# Patient Record
Sex: Female | Born: 1950 | Race: White | Hispanic: No | Smoking: Never smoker
Health system: Southern US, Community
[De-identification: ages and names within clinical notes are randomized; demographics above are authoritative.]

## PROBLEM LIST (undated history)

## (undated) DIAGNOSIS — H3320 Serous retinal detachment, unspecified eye: Secondary | ICD-10-CM

## (undated) DIAGNOSIS — J069 Acute upper respiratory infection, unspecified: Secondary | ICD-10-CM

## (undated) DIAGNOSIS — K219 Gastro-esophageal reflux disease without esophagitis: Secondary | ICD-10-CM

## (undated) DIAGNOSIS — Z973 Presence of spectacles and contact lenses: Secondary | ICD-10-CM

## (undated) DIAGNOSIS — J189 Pneumonia, unspecified organism: Secondary | ICD-10-CM

## (undated) DIAGNOSIS — R030 Elevated blood-pressure reading, without diagnosis of hypertension: Secondary | ICD-10-CM

## (undated) DIAGNOSIS — Z8489 Family history of other specified conditions: Secondary | ICD-10-CM

## (undated) DIAGNOSIS — C210 Malignant neoplasm of anus, unspecified: Secondary | ICD-10-CM

## (undated) DIAGNOSIS — G709 Myoneural disorder, unspecified: Secondary | ICD-10-CM

## (undated) DIAGNOSIS — C7951 Secondary malignant neoplasm of bone: Secondary | ICD-10-CM

## (undated) DIAGNOSIS — F32A Depression, unspecified: Secondary | ICD-10-CM

## (undated) DIAGNOSIS — N390 Urinary tract infection, site not specified: Secondary | ICD-10-CM

## (undated) DIAGNOSIS — C20 Malignant neoplasm of rectum: Secondary | ICD-10-CM

## (undated) DIAGNOSIS — I10 Essential (primary) hypertension: Secondary | ICD-10-CM

## (undated) DIAGNOSIS — R599 Enlarged lymph nodes, unspecified: Secondary | ICD-10-CM

## (undated) DIAGNOSIS — F329 Major depressive disorder, single episode, unspecified: Secondary | ICD-10-CM

## (undated) DIAGNOSIS — N3090 Cystitis, unspecified without hematuria: Secondary | ICD-10-CM

## (undated) DIAGNOSIS — I972 Postmastectomy lymphedema syndrome: Secondary | ICD-10-CM

## (undated) DIAGNOSIS — C50919 Malignant neoplasm of unspecified site of unspecified female breast: Secondary | ICD-10-CM

## (undated) DIAGNOSIS — Z95828 Presence of other vascular implants and grafts: Secondary | ICD-10-CM

## (undated) DIAGNOSIS — S42309A Unspecified fracture of shaft of humerus, unspecified arm, initial encounter for closed fracture: Secondary | ICD-10-CM

## (undated) DIAGNOSIS — IMO0001 Reserved for inherently not codable concepts without codable children: Secondary | ICD-10-CM

## (undated) DIAGNOSIS — R5383 Other fatigue: Secondary | ICD-10-CM

## (undated) HISTORY — DX: Cystitis, unspecified without hematuria: N30.90

## (undated) HISTORY — DX: Malignant neoplasm of anus, unspecified: C21.0

## (undated) HISTORY — DX: Malignant neoplasm of unspecified site of unspecified female breast: C50.919

## (undated) HISTORY — DX: Reserved for inherently not codable concepts without codable children: IMO0001

## (undated) HISTORY — DX: Presence of spectacles and contact lenses: Z97.3

## (undated) HISTORY — DX: Urinary tract infection, site not specified: N39.0

## (undated) HISTORY — DX: Gastro-esophageal reflux disease without esophagitis: K21.9

## (undated) HISTORY — PX: OTHER SURGICAL HISTORY: SHX169

## (undated) HISTORY — DX: Acute upper respiratory infection, unspecified: J06.9

## (undated) HISTORY — DX: Malignant neoplasm of rectum: C20

## (undated) HISTORY — DX: Enlarged lymph nodes, unspecified: R59.9

## (undated) HISTORY — PX: MASTECTOMY MODIFIED RADICAL: SUR848

## (undated) HISTORY — DX: Unspecified fracture of shaft of humerus, unspecified arm, initial encounter for closed fracture: S42.309A

## (undated) HISTORY — DX: Pneumonia, unspecified organism: J18.9

## (undated) HISTORY — DX: Secondary malignant neoplasm of bone: C79.51

## (undated) HISTORY — DX: Presence of other vascular implants and grafts: Z95.828

## (undated) HISTORY — DX: Other fatigue: R53.83

## (undated) HISTORY — DX: Elevated blood-pressure reading, without diagnosis of hypertension: R03.0

## (undated) HISTORY — DX: Serous retinal detachment, unspecified eye: H33.20

## (undated) HISTORY — PX: CATARACT EXTRACTION: SUR2

## (undated) NOTE — *Deleted (*Deleted)
   06/22/20 0746  Nephrostomy Left 10.2 Fr.  Placement Date: 06/17/20   Person Inserting Catheter: Fredia Sorrow  Location: Left  Tube Size (Fr.): 10.2 Fr.  Urine Returned: Yes  Dressing Status Clean;Dry;Intact  Dressing Type Split gauze  Dressing Change Due 06/23/20  Tube Status To gravity  Collection Container Urostomy Pouch  Securement Method Tape  Output (mL) 250 mL  Dressing changed per order.  Site is slightly pink.  No drainage or complaints of pain while cleaning the site.  Stat loc in place.  Covered with split gauze and secured with paper tape for pt comfort.  Pt tolerated well.

---

## 1997-10-04 ENCOUNTER — Ambulatory Visit (HOSPITAL_COMMUNITY): Admission: RE | Admit: 1997-10-04 | Discharge: 1997-10-04 | Payer: Self-pay | Admitting: Obstetrics & Gynecology

## 2001-08-26 ENCOUNTER — Encounter: Payer: Self-pay | Admitting: Neurosurgery

## 2001-08-26 ENCOUNTER — Ambulatory Visit (HOSPITAL_COMMUNITY): Admission: RE | Admit: 2001-08-26 | Discharge: 2001-08-26 | Payer: Self-pay | Admitting: Neurosurgery

## 2001-08-30 ENCOUNTER — Ambulatory Visit (HOSPITAL_COMMUNITY): Admission: RE | Admit: 2001-08-30 | Discharge: 2001-08-30 | Payer: Self-pay | Admitting: Neurosurgery

## 2001-08-30 ENCOUNTER — Encounter: Payer: Self-pay | Admitting: Neurosurgery

## 2002-01-17 ENCOUNTER — Other Ambulatory Visit: Admission: RE | Admit: 2002-01-17 | Discharge: 2002-01-17 | Payer: Self-pay | Admitting: Obstetrics & Gynecology

## 2003-01-23 ENCOUNTER — Other Ambulatory Visit: Admission: RE | Admit: 2003-01-23 | Discharge: 2003-01-23 | Payer: Self-pay | Admitting: Obstetrics & Gynecology

## 2004-04-10 ENCOUNTER — Ambulatory Visit (HOSPITAL_COMMUNITY): Admission: RE | Admit: 2004-04-10 | Discharge: 2004-04-11 | Payer: Self-pay | Admitting: Ophthalmology

## 2004-05-07 ENCOUNTER — Other Ambulatory Visit: Admission: RE | Admit: 2004-05-07 | Discharge: 2004-05-07 | Payer: Self-pay | Admitting: Obstetrics & Gynecology

## 2005-07-14 ENCOUNTER — Other Ambulatory Visit: Admission: RE | Admit: 2005-07-14 | Discharge: 2005-07-14 | Payer: Self-pay | Admitting: Obstetrics & Gynecology

## 2007-12-27 ENCOUNTER — Encounter: Admission: RE | Admit: 2007-12-27 | Discharge: 2007-12-27 | Payer: Self-pay | Admitting: Obstetrics & Gynecology

## 2008-04-09 ENCOUNTER — Encounter: Admission: RE | Admit: 2008-04-09 | Discharge: 2008-04-09 | Payer: Self-pay | Admitting: Internal Medicine

## 2008-04-09 ENCOUNTER — Encounter (INDEPENDENT_AMBULATORY_CARE_PROVIDER_SITE_OTHER): Payer: Self-pay | Admitting: Diagnostic Radiology

## 2008-04-12 ENCOUNTER — Encounter: Admission: RE | Admit: 2008-04-12 | Discharge: 2008-04-12 | Payer: Self-pay | Admitting: Internal Medicine

## 2008-04-15 ENCOUNTER — Encounter: Admission: RE | Admit: 2008-04-15 | Discharge: 2008-04-15 | Payer: Self-pay | Admitting: Internal Medicine

## 2008-04-18 ENCOUNTER — Ambulatory Visit (HOSPITAL_COMMUNITY): Admission: RE | Admit: 2008-04-18 | Discharge: 2008-04-18 | Payer: Self-pay | Admitting: Internal Medicine

## 2008-04-19 ENCOUNTER — Encounter (HOSPITAL_COMMUNITY): Admission: RE | Admit: 2008-04-19 | Discharge: 2008-05-07 | Payer: Self-pay | Admitting: Internal Medicine

## 2008-04-20 ENCOUNTER — Encounter (INDEPENDENT_AMBULATORY_CARE_PROVIDER_SITE_OTHER): Payer: Self-pay | Admitting: Diagnostic Radiology

## 2008-04-20 ENCOUNTER — Encounter: Admission: RE | Admit: 2008-04-20 | Discharge: 2008-04-20 | Payer: Self-pay | Admitting: Internal Medicine

## 2008-04-24 ENCOUNTER — Encounter (HOSPITAL_COMMUNITY): Admission: RE | Admit: 2008-04-24 | Discharge: 2008-05-07 | Payer: Self-pay | Admitting: Oncology

## 2008-04-24 ENCOUNTER — Ambulatory Visit (HOSPITAL_COMMUNITY): Payer: Self-pay | Admitting: Oncology

## 2008-04-26 ENCOUNTER — Encounter (HOSPITAL_COMMUNITY): Payer: Self-pay | Admitting: Oncology

## 2008-04-30 ENCOUNTER — Ambulatory Visit (HOSPITAL_BASED_OUTPATIENT_CLINIC_OR_DEPARTMENT_OTHER): Admission: RE | Admit: 2008-04-30 | Discharge: 2008-04-30 | Payer: Self-pay | Admitting: General Surgery

## 2008-04-30 ENCOUNTER — Ambulatory Visit: Admission: RE | Admit: 2008-04-30 | Discharge: 2008-06-04 | Payer: Self-pay | Admitting: Radiation Oncology

## 2008-05-14 ENCOUNTER — Encounter (HOSPITAL_COMMUNITY): Admission: RE | Admit: 2008-05-14 | Discharge: 2008-06-13 | Payer: Self-pay | Admitting: Oncology

## 2008-06-14 ENCOUNTER — Ambulatory Visit (HOSPITAL_COMMUNITY): Payer: Self-pay | Admitting: Oncology

## 2008-06-20 ENCOUNTER — Encounter (HOSPITAL_COMMUNITY): Admission: RE | Admit: 2008-06-20 | Discharge: 2008-07-20 | Payer: Self-pay | Admitting: Oncology

## 2008-06-20 ENCOUNTER — Ambulatory Visit: Payer: Self-pay | Admitting: Cardiology

## 2008-06-20 ENCOUNTER — Encounter (HOSPITAL_COMMUNITY): Payer: Self-pay | Admitting: Oncology

## 2008-07-03 ENCOUNTER — Encounter: Admission: RE | Admit: 2008-07-03 | Discharge: 2008-07-03 | Payer: Self-pay | Admitting: Oncology

## 2008-07-25 ENCOUNTER — Encounter (HOSPITAL_COMMUNITY): Admission: RE | Admit: 2008-07-25 | Discharge: 2008-08-24 | Payer: Self-pay | Admitting: Oncology

## 2008-07-30 ENCOUNTER — Ambulatory Visit (HOSPITAL_COMMUNITY): Payer: Self-pay | Admitting: Oncology

## 2008-08-24 ENCOUNTER — Ambulatory Visit (HOSPITAL_COMMUNITY): Admission: RE | Admit: 2008-08-24 | Discharge: 2008-08-24 | Payer: Self-pay | Admitting: Oncology

## 2008-08-27 ENCOUNTER — Encounter (HOSPITAL_COMMUNITY): Admission: RE | Admit: 2008-08-27 | Discharge: 2008-09-26 | Payer: Self-pay | Admitting: Oncology

## 2008-08-30 ENCOUNTER — Encounter (INDEPENDENT_AMBULATORY_CARE_PROVIDER_SITE_OTHER): Payer: Self-pay | Admitting: General Surgery

## 2008-08-30 ENCOUNTER — Ambulatory Visit (HOSPITAL_COMMUNITY): Admission: RE | Admit: 2008-08-30 | Discharge: 2008-08-31 | Payer: Self-pay | Admitting: General Surgery

## 2008-09-12 ENCOUNTER — Ambulatory Visit: Admission: RE | Admit: 2008-09-12 | Discharge: 2008-12-11 | Payer: Self-pay | Admitting: Radiation Oncology

## 2008-09-17 ENCOUNTER — Encounter (HOSPITAL_COMMUNITY): Payer: Self-pay | Admitting: Oncology

## 2008-09-17 ENCOUNTER — Ambulatory Visit: Payer: Self-pay | Admitting: Cardiology

## 2008-09-24 ENCOUNTER — Ambulatory Visit (HOSPITAL_COMMUNITY): Payer: Self-pay | Admitting: Oncology

## 2008-10-15 ENCOUNTER — Encounter (HOSPITAL_COMMUNITY): Admission: RE | Admit: 2008-10-15 | Discharge: 2008-11-14 | Payer: Self-pay | Admitting: Oncology

## 2008-11-27 ENCOUNTER — Encounter (HOSPITAL_COMMUNITY): Admission: RE | Admit: 2008-11-27 | Discharge: 2008-12-27 | Payer: Self-pay | Admitting: Oncology

## 2008-11-27 ENCOUNTER — Ambulatory Visit (HOSPITAL_COMMUNITY): Payer: Self-pay | Admitting: Oncology

## 2008-12-12 ENCOUNTER — Ambulatory Visit: Payer: Self-pay | Admitting: Cardiology

## 2008-12-12 ENCOUNTER — Encounter (HOSPITAL_COMMUNITY): Payer: Self-pay | Admitting: Oncology

## 2009-01-08 ENCOUNTER — Encounter (HOSPITAL_COMMUNITY): Admission: RE | Admit: 2009-01-08 | Discharge: 2009-02-07 | Payer: Self-pay | Admitting: Oncology

## 2009-01-29 ENCOUNTER — Ambulatory Visit (HOSPITAL_COMMUNITY): Payer: Self-pay | Admitting: Oncology

## 2009-02-19 ENCOUNTER — Encounter (HOSPITAL_COMMUNITY): Admission: RE | Admit: 2009-02-19 | Discharge: 2009-03-21 | Payer: Self-pay | Admitting: Oncology

## 2009-03-14 ENCOUNTER — Encounter (HOSPITAL_COMMUNITY): Payer: Self-pay | Admitting: Oncology

## 2009-03-14 ENCOUNTER — Ambulatory Visit: Payer: Self-pay | Admitting: Cardiology

## 2009-03-29 ENCOUNTER — Ambulatory Visit (HOSPITAL_COMMUNITY): Payer: Self-pay | Admitting: Oncology

## 2009-03-29 ENCOUNTER — Encounter (HOSPITAL_COMMUNITY): Admission: RE | Admit: 2009-03-29 | Discharge: 2009-04-28 | Payer: Self-pay | Admitting: Oncology

## 2009-04-04 ENCOUNTER — Encounter (HOSPITAL_COMMUNITY): Admission: RE | Admit: 2009-04-04 | Discharge: 2009-05-04 | Payer: Self-pay | Admitting: Oncology

## 2009-05-14 ENCOUNTER — Ambulatory Visit (HOSPITAL_COMMUNITY): Payer: Self-pay | Admitting: Oncology

## 2009-05-14 ENCOUNTER — Encounter (HOSPITAL_COMMUNITY): Admission: RE | Admit: 2009-05-14 | Discharge: 2009-06-13 | Payer: Self-pay | Admitting: Oncology

## 2009-05-27 ENCOUNTER — Encounter: Admission: RE | Admit: 2009-05-27 | Discharge: 2009-05-27 | Payer: Self-pay | Admitting: Oncology

## 2009-06-05 ENCOUNTER — Encounter (HOSPITAL_COMMUNITY): Payer: Self-pay | Admitting: Oncology

## 2009-06-05 ENCOUNTER — Ambulatory Visit: Payer: Self-pay | Admitting: Cardiology

## 2009-08-06 ENCOUNTER — Ambulatory Visit (HOSPITAL_COMMUNITY): Payer: Self-pay | Admitting: Oncology

## 2009-08-14 ENCOUNTER — Encounter: Admission: RE | Admit: 2009-08-14 | Discharge: 2009-08-14 | Payer: Self-pay | Admitting: Obstetrics & Gynecology

## 2009-08-20 ENCOUNTER — Encounter (HOSPITAL_COMMUNITY): Admission: RE | Admit: 2009-08-20 | Discharge: 2009-09-19 | Payer: Self-pay | Admitting: Internal Medicine

## 2009-08-20 ENCOUNTER — Ambulatory Visit (HOSPITAL_COMMUNITY): Payer: Self-pay | Admitting: Internal Medicine

## 2009-09-20 ENCOUNTER — Encounter (HOSPITAL_COMMUNITY): Admission: RE | Admit: 2009-09-20 | Discharge: 2009-10-20 | Payer: Self-pay | Admitting: Oncology

## 2009-10-31 ENCOUNTER — Ambulatory Visit (HOSPITAL_COMMUNITY): Payer: Self-pay | Admitting: Oncology

## 2009-12-24 ENCOUNTER — Ambulatory Visit (HOSPITAL_COMMUNITY): Payer: Self-pay | Admitting: Oncology

## 2010-01-28 ENCOUNTER — Encounter (HOSPITAL_COMMUNITY): Admission: RE | Admit: 2010-01-28 | Discharge: 2010-02-27 | Payer: Self-pay | Admitting: Oncology

## 2010-02-18 ENCOUNTER — Ambulatory Visit (HOSPITAL_COMMUNITY): Payer: Self-pay | Admitting: Oncology

## 2010-05-01 ENCOUNTER — Ambulatory Visit (HOSPITAL_COMMUNITY): Payer: Self-pay | Admitting: Oncology

## 2010-06-13 ENCOUNTER — Encounter (HOSPITAL_COMMUNITY)
Admission: RE | Admit: 2010-06-13 | Discharge: 2010-07-13 | Payer: Self-pay | Source: Home / Self Care | Admitting: Oncology

## 2010-06-24 ENCOUNTER — Ambulatory Visit (HOSPITAL_COMMUNITY): Payer: Self-pay | Admitting: Oncology

## 2010-07-25 ENCOUNTER — Encounter (HOSPITAL_COMMUNITY)
Admission: RE | Admit: 2010-07-25 | Discharge: 2010-08-24 | Payer: Self-pay | Source: Home / Self Care | Attending: Oncology | Admitting: Oncology

## 2010-08-15 ENCOUNTER — Encounter
Admission: RE | Admit: 2010-08-15 | Discharge: 2010-08-15 | Payer: Self-pay | Source: Home / Self Care | Attending: Oncology | Admitting: Oncology

## 2010-08-19 ENCOUNTER — Encounter
Admission: RE | Admit: 2010-08-19 | Discharge: 2010-08-19 | Payer: Self-pay | Source: Home / Self Care | Attending: Oncology | Admitting: Oncology

## 2010-09-05 ENCOUNTER — Ambulatory Visit (HOSPITAL_COMMUNITY)
Admission: RE | Admit: 2010-09-05 | Discharge: 2010-09-09 | Payer: Self-pay | Source: Home / Self Care | Attending: Oncology | Admitting: Oncology

## 2010-09-05 ENCOUNTER — Encounter (HOSPITAL_COMMUNITY)
Admission: RE | Admit: 2010-09-05 | Discharge: 2010-09-09 | Payer: Self-pay | Source: Home / Self Care | Attending: Oncology | Admitting: Oncology

## 2010-10-17 ENCOUNTER — Other Ambulatory Visit (HOSPITAL_COMMUNITY): Payer: BC Managed Care – PPO

## 2010-10-17 ENCOUNTER — Encounter (HOSPITAL_COMMUNITY): Payer: BC Managed Care – PPO | Attending: Oncology

## 2010-10-17 DIAGNOSIS — Z79899 Other long term (current) drug therapy: Secondary | ICD-10-CM | POA: Insufficient documentation

## 2010-10-17 DIAGNOSIS — C50919 Malignant neoplasm of unspecified site of unspecified female breast: Secondary | ICD-10-CM

## 2010-10-21 LAB — CBC
HCT: 38 % (ref 36.0–46.0)
Hemoglobin: 12.9 g/dL (ref 12.0–15.0)
MCH: 33.4 pg (ref 26.0–34.0)
MCHC: 33.9 g/dL (ref 30.0–36.0)
MCV: 98.6 fL (ref 78.0–100.0)
Platelets: 201 10*3/uL (ref 150–400)
RBC: 3.85 MIL/uL — ABNORMAL LOW (ref 3.87–5.11)
RDW: 12.8 % (ref 11.5–15.5)
WBC: 6.8 10*3/uL (ref 4.0–10.5)

## 2010-10-21 LAB — COMPREHENSIVE METABOLIC PANEL
ALT: 18 U/L (ref 0–35)
AST: 25 U/L (ref 0–37)
Albumin: 4.2 g/dL (ref 3.5–5.2)
Alkaline Phosphatase: 61 U/L (ref 39–117)
BUN: 14 mg/dL (ref 6–23)
CO2: 25 mEq/L (ref 19–32)
Calcium: 9.4 mg/dL (ref 8.4–10.5)
Chloride: 104 mEq/L (ref 96–112)
Creatinine, Ser: 0.76 mg/dL (ref 0.4–1.2)
GFR calc Af Amer: >60 mL/min (ref >60)
GFR calc non Af Amer: >60 mL/min (ref >60)
Glucose, Bld: 87 mg/dL (ref 70–99)
Potassium: 3.9 mEq/L (ref 3.5–5.1)
Sodium: 136 mEq/L (ref 135–145)
Total Bilirubin: 0.7 mg/dL (ref 0.3–1.2)
Total Protein: 6.9 g/dL (ref 6.0–8.3)

## 2010-10-21 LAB — DIFFERENTIAL
Basophils Absolute: 0 10*3/uL (ref 0.0–0.1)
Basophils Relative: 1 % (ref 0–1)
Eosinophils Absolute: 0 10*3/uL (ref 0.0–0.7)
Eosinophils Relative: 1 % (ref 0–5)
Lymphocytes Relative: 26 % (ref 12–46)
Lymphs Abs: 1.8 10*3/uL (ref 0.7–4.0)
Monocytes Absolute: 0.4 10*3/uL (ref 0.1–1.0)
Monocytes Relative: 6 % (ref 3–12)
Neutro Abs: 4.5 10*3/uL (ref 1.7–7.7)
Neutrophils Relative %: 67 % (ref 43–77)

## 2010-10-21 LAB — CANCER ANTIGEN 27.29: CA 27.29: 20 U/mL (ref 0–39)

## 2010-10-26 LAB — COMPREHENSIVE METABOLIC PANEL
ALT: 19 U/L (ref 0–35)
AST: 27 U/L (ref 0–37)
Albumin: 3.9 g/dL (ref 3.5–5.2)
Alkaline Phosphatase: 61 U/L (ref 39–117)
BUN: 13 mg/dL (ref 6–23)
CO2: 27 mEq/L (ref 19–32)
Calcium: 9.2 mg/dL (ref 8.4–10.5)
Chloride: 103 mEq/L (ref 96–112)
Creatinine, Ser: 0.74 mg/dL (ref 0.4–1.2)
GFR calc Af Amer: >60 mL/min (ref >60)
GFR calc non Af Amer: >60 mL/min (ref >60)
Glucose, Bld: 89 mg/dL (ref 70–99)
Potassium: 4 mEq/L (ref 3.5–5.1)
Sodium: 136 mEq/L (ref 135–145)
Total Bilirubin: 0.6 mg/dL (ref 0.3–1.2)
Total Protein: 6.7 g/dL (ref 6.0–8.3)

## 2010-10-26 LAB — CREATININE, SERUM
Creatinine, Ser: 0.83 mg/dL (ref 0.4–1.2)
GFR calc Af Amer: >60 mL/min (ref >60)
GFR calc non Af Amer: >60 mL/min (ref >60)

## 2010-10-28 ENCOUNTER — Ambulatory Visit (HOSPITAL_COMMUNITY): Payer: BC Managed Care – PPO | Admitting: Oncology

## 2010-10-28 DIAGNOSIS — C50919 Malignant neoplasm of unspecified site of unspecified female breast: Secondary | ICD-10-CM

## 2010-10-29 LAB — CBC
HCT: 39.8 % (ref 36.0–46.0)
Hemoglobin: 13.6 g/dL (ref 12.0–15.0)
MCHC: 34.3 g/dL (ref 30.0–36.0)
MCV: 98.7 fL (ref 78.0–100.0)
Platelets: 204 10*3/uL (ref 150–400)
RBC: 4.03 MIL/uL (ref 3.87–5.11)
RDW: 13.5 % (ref 11.5–15.5)
WBC: 6.1 10*3/uL (ref 4.0–10.5)

## 2010-10-29 LAB — DIFFERENTIAL
Basophils Absolute: 0 10*3/uL (ref 0.0–0.1)
Basophils Relative: 1 % (ref 0–1)
Eosinophils Absolute: 0 10*3/uL (ref 0.0–0.7)
Eosinophils Relative: 0 % (ref 0–5)
Lymphocytes Relative: 21 % (ref 12–46)
Lymphs Abs: 1.3 10*3/uL (ref 0.7–4.0)
Monocytes Absolute: 0.4 10*3/uL (ref 0.1–1.0)
Monocytes Relative: 7 % (ref 3–12)
Neutro Abs: 4.3 10*3/uL (ref 1.7–7.7)
Neutrophils Relative %: 71 % (ref 43–77)

## 2010-10-29 LAB — COMPREHENSIVE METABOLIC PANEL
ALT: 20 U/L (ref 0–35)
AST: 26 U/L (ref 0–37)
Albumin: 3.9 g/dL (ref 3.5–5.2)
Alkaline Phosphatase: 67 U/L (ref 39–117)
BUN: 16 mg/dL (ref 6–23)
CO2: 27 mEq/L (ref 19–32)
Calcium: 9.4 mg/dL (ref 8.4–10.5)
Chloride: 103 mEq/L (ref 96–112)
Creatinine, Ser: 0.68 mg/dL (ref 0.4–1.2)
GFR calc Af Amer: >60 mL/min (ref >60)
GFR calc non Af Amer: >60 mL/min (ref >60)
Glucose, Bld: 118 mg/dL — ABNORMAL HIGH (ref 70–99)
Potassium: 4 mEq/L (ref 3.5–5.1)
Sodium: 137 mEq/L (ref 135–145)
Total Bilirubin: 0.7 mg/dL (ref 0.3–1.2)
Total Protein: 6.8 g/dL (ref 6.0–8.3)

## 2010-11-13 LAB — DIFFERENTIAL
Basophils Absolute: 0 10*3/uL (ref 0.0–0.1)
Basophils Relative: 0 % (ref 0–1)
Eosinophils Absolute: 0.1 10*3/uL (ref 0.0–0.7)
Eosinophils Relative: 1 % (ref 0–5)
Lymphocytes Relative: 20 % (ref 12–46)
Lymphs Abs: 1.1 10*3/uL (ref 0.7–4.0)
Monocytes Absolute: 0.3 10*3/uL (ref 0.1–1.0)
Monocytes Relative: 6 % (ref 3–12)
Neutro Abs: 3.9 10*3/uL (ref 1.7–7.7)
Neutrophils Relative %: 72 % (ref 43–77)

## 2010-11-13 LAB — CBC
HCT: 37.9 % (ref 36.0–46.0)
Hemoglobin: 13.2 g/dL (ref 12.0–15.0)
MCHC: 34.9 g/dL (ref 30.0–36.0)
MCV: 97 fL (ref 78.0–100.0)
Platelets: 200 10*3/uL (ref 150–400)
RBC: 3.91 MIL/uL (ref 3.87–5.11)
RDW: 13.3 % (ref 11.5–15.5)
WBC: 5.5 10*3/uL (ref 4.0–10.5)

## 2010-11-15 LAB — COMPREHENSIVE METABOLIC PANEL
ALT: 15 U/L (ref 0–35)
AST: 27 U/L (ref 0–37)
Albumin: 3.7 g/dL (ref 3.5–5.2)
Alkaline Phosphatase: 65 U/L (ref 39–117)
BUN: 15 mg/dL (ref 6–23)
CO2: 27 mEq/L (ref 19–32)
Calcium: 9.3 mg/dL (ref 8.4–10.5)
Chloride: 102 mEq/L (ref 96–112)
Creatinine, Ser: 0.88 mg/dL (ref 0.4–1.2)
GFR calc Af Amer: >60 mL/min (ref >60)
GFR calc non Af Amer: >60 mL/min (ref >60)
Glucose, Bld: 123 mg/dL — ABNORMAL HIGH (ref 70–99)
Potassium: 3.8 mEq/L (ref 3.5–5.1)
Sodium: 138 mEq/L (ref 135–145)
Total Bilirubin: 0.6 mg/dL (ref 0.3–1.2)
Total Protein: 6.4 g/dL (ref 6.0–8.3)

## 2010-11-15 LAB — DIFFERENTIAL
Basophils Absolute: 0 10*3/uL (ref 0.0–0.1)
Basophils Relative: 1 % (ref 0–1)
Eosinophils Absolute: 0 10*3/uL (ref 0.0–0.7)
Eosinophils Relative: 1 % (ref 0–5)
Lymphocytes Relative: 17 % (ref 12–46)
Lymphs Abs: 1 10*3/uL (ref 0.7–4.0)
Monocytes Absolute: 0.4 10*3/uL (ref 0.1–1.0)
Monocytes Relative: 6 % (ref 3–12)
Neutro Abs: 4.4 10*3/uL (ref 1.7–7.7)
Neutrophils Relative %: 76 % (ref 43–77)

## 2010-11-15 LAB — CBC
HCT: 37.8 % (ref 36.0–46.0)
Hemoglobin: 13.3 g/dL (ref 12.0–15.0)
MCHC: 35.2 g/dL (ref 30.0–36.0)
MCV: 96.8 fL (ref 78.0–100.0)
Platelets: 201 10*3/uL (ref 150–400)
RBC: 3.9 MIL/uL (ref 3.87–5.11)
RDW: 13.5 % (ref 11.5–15.5)
WBC: 5.8 10*3/uL (ref 4.0–10.5)

## 2010-11-16 LAB — DIFFERENTIAL
Basophils Absolute: 0 10*3/uL (ref 0.0–0.1)
Basophils Relative: 1 % (ref 0–1)
Eosinophils Absolute: 0.1 10*3/uL (ref 0.0–0.7)
Eosinophils Relative: 1 % (ref 0–5)
Lymphocytes Relative: 22 % (ref 12–46)
Lymphs Abs: 1.2 10*3/uL (ref 0.7–4.0)
Monocytes Absolute: 0.4 10*3/uL (ref 0.1–1.0)
Monocytes Relative: 8 % (ref 3–12)
Neutro Abs: 3.7 10*3/uL (ref 1.7–7.7)
Neutrophils Relative %: 69 % (ref 43–77)

## 2010-11-16 LAB — CBC
HCT: 36.4 % (ref 36.0–46.0)
Hemoglobin: 12.8 g/dL (ref 12.0–15.0)
MCHC: 35.3 g/dL (ref 30.0–36.0)
MCV: 97.1 fL (ref 78.0–100.0)
Platelets: 182 10*3/uL (ref 150–400)
RBC: 3.75 MIL/uL — ABNORMAL LOW (ref 3.87–5.11)
RDW: 13.5 % (ref 11.5–15.5)
WBC: 5.3 10*3/uL (ref 4.0–10.5)

## 2010-11-17 LAB — COMPREHENSIVE METABOLIC PANEL
ALT: 17 U/L (ref 0–35)
AST: 26 U/L (ref 0–37)
Albumin: 3.8 g/dL (ref 3.5–5.2)
Alkaline Phosphatase: 82 U/L (ref 39–117)
BUN: 15 mg/dL (ref 6–23)
CO2: 26 mEq/L (ref 19–32)
Calcium: 9.2 mg/dL (ref 8.4–10.5)
Chloride: 104 mEq/L (ref 96–112)
Creatinine, Ser: 0.74 mg/dL (ref 0.4–1.2)
GFR calc Af Amer: >60 mL/min (ref >60)
GFR calc non Af Amer: >60 mL/min (ref >60)
Glucose, Bld: 103 mg/dL — ABNORMAL HIGH (ref 70–99)
Potassium: 3.7 mEq/L (ref 3.5–5.1)
Sodium: 137 mEq/L (ref 135–145)
Total Bilirubin: 0.6 mg/dL (ref 0.3–1.2)
Total Protein: 6.4 g/dL (ref 6.0–8.3)

## 2010-11-17 LAB — DIFFERENTIAL
Basophils Absolute: 0 10*3/uL (ref 0.0–0.1)
Basophils Relative: 1 % (ref 0–1)
Eosinophils Absolute: 0.1 10*3/uL (ref 0.0–0.7)
Eosinophils Relative: 2 % (ref 0–5)
Lymphocytes Relative: 20 % (ref 12–46)
Lymphs Abs: 0.9 10*3/uL (ref 0.7–4.0)
Monocytes Absolute: 0.3 10*3/uL (ref 0.1–1.0)
Monocytes Relative: 7 % (ref 3–12)
Neutro Abs: 3.2 10*3/uL (ref 1.7–7.7)
Neutrophils Relative %: 71 % (ref 43–77)

## 2010-11-17 LAB — CBC
HCT: 34.8 % — ABNORMAL LOW (ref 36.0–46.0)
Hemoglobin: 12.5 g/dL (ref 12.0–15.0)
MCHC: 35.8 g/dL (ref 30.0–36.0)
MCV: 93.5 fL (ref 78.0–100.0)
Platelets: 214 10*3/uL (ref 150–400)
RBC: 3.72 MIL/uL — ABNORMAL LOW (ref 3.87–5.11)
RDW: 13.9 % (ref 11.5–15.5)
WBC: 4.5 10*3/uL (ref 4.0–10.5)

## 2010-11-18 LAB — COMPREHENSIVE METABOLIC PANEL
ALT: 21 U/L (ref 0–35)
AST: 28 U/L (ref 0–37)
Albumin: 3.8 g/dL (ref 3.5–5.2)
Alkaline Phosphatase: 97 U/L (ref 39–117)
BUN: 13 mg/dL (ref 6–23)
CO2: 27 mEq/L (ref 19–32)
Calcium: 9.2 mg/dL (ref 8.4–10.5)
Chloride: 104 mEq/L (ref 96–112)
Creatinine, Ser: 0.82 mg/dL (ref 0.4–1.2)
GFR calc Af Amer: >60 mL/min (ref >60)
GFR calc non Af Amer: >60 mL/min (ref >60)
Glucose, Bld: 120 mg/dL — ABNORMAL HIGH (ref 70–99)
Potassium: 3.9 mEq/L (ref 3.5–5.1)
Sodium: 138 mEq/L (ref 135–145)
Total Bilirubin: 0.6 mg/dL (ref 0.3–1.2)
Total Protein: 6.6 g/dL (ref 6.0–8.3)

## 2010-11-18 LAB — DIFFERENTIAL
Basophils Absolute: 0 10*3/uL (ref 0.0–0.1)
Basophils Relative: 1 % (ref 0–1)
Eosinophils Absolute: 0.1 10*3/uL (ref 0.0–0.7)
Eosinophils Relative: 2 % (ref 0–5)
Lymphocytes Relative: 15 % (ref 12–46)
Lymphs Abs: 0.7 10*3/uL (ref 0.7–4.0)
Monocytes Absolute: 0.4 10*3/uL (ref 0.1–1.0)
Monocytes Relative: 8 % (ref 3–12)
Neutro Abs: 3.6 10*3/uL (ref 1.7–7.7)
Neutrophils Relative %: 74 % (ref 43–77)

## 2010-11-18 LAB — CBC
HCT: 37.8 % (ref 36.0–46.0)
Hemoglobin: 13.3 g/dL (ref 12.0–15.0)
MCHC: 35 g/dL (ref 30.0–36.0)
MCV: 93.9 fL (ref 78.0–100.0)
Platelets: 201 10*3/uL (ref 150–400)
RBC: 4.03 MIL/uL (ref 3.87–5.11)
RDW: 13.6 % (ref 11.5–15.5)
WBC: 4.8 10*3/uL (ref 4.0–10.5)

## 2010-11-19 LAB — DIFFERENTIAL
Basophils Absolute: 0 10*3/uL (ref 0.0–0.1)
Basophils Relative: 1 % (ref 0–1)
Eosinophils Absolute: 0.1 10*3/uL (ref 0.0–0.7)
Eosinophils Relative: 2 % (ref 0–5)
Lymphocytes Relative: 13 % (ref 12–46)
Lymphs Abs: 0.6 10*3/uL — ABNORMAL LOW (ref 0.7–4.0)
Monocytes Absolute: 0.3 10*3/uL (ref 0.1–1.0)
Monocytes Relative: 8 % (ref 3–12)
Neutro Abs: 3.2 10*3/uL (ref 1.7–7.7)
Neutrophils Relative %: 76 % (ref 43–77)

## 2010-11-19 LAB — CBC
HCT: 37.7 % (ref 36.0–46.0)
Hemoglobin: 13.1 g/dL (ref 12.0–15.0)
MCHC: 34.8 g/dL (ref 30.0–36.0)
MCV: 94.3 fL (ref 78.0–100.0)
Platelets: 174 10*3/uL (ref 150–400)
RBC: 4 MIL/uL (ref 3.87–5.11)
RDW: 13.4 % (ref 11.5–15.5)
WBC: 4.2 10*3/uL (ref 4.0–10.5)

## 2010-11-20 LAB — DIFFERENTIAL
Basophils Absolute: 0 10*3/uL (ref 0.0–0.1)
Basophils Relative: 1 % (ref 0–1)
Eosinophils Absolute: 0.1 10*3/uL (ref 0.0–0.7)
Eosinophils Relative: 2 % (ref 0–5)
Lymphocytes Relative: 24 % (ref 12–46)
Lymphs Abs: 1 10*3/uL (ref 0.7–4.0)
Monocytes Absolute: 0.3 10*3/uL (ref 0.1–1.0)
Monocytes Relative: 7 % (ref 3–12)
Neutro Abs: 2.6 10*3/uL (ref 1.7–7.7)
Neutrophils Relative %: 66 % (ref 43–77)

## 2010-11-20 LAB — CBC
HCT: 37.6 % (ref 36.0–46.0)
Hemoglobin: 13 g/dL (ref 12.0–15.0)
MCHC: 34.6 g/dL (ref 30.0–36.0)
MCV: 94.8 fL (ref 78.0–100.0)
Platelets: 210 10*3/uL (ref 150–400)
RBC: 3.97 MIL/uL (ref 3.87–5.11)
RDW: 14.2 % (ref 11.5–15.5)
WBC: 4 10*3/uL (ref 4.0–10.5)

## 2010-11-24 LAB — COMPREHENSIVE METABOLIC PANEL
ALT: 39 U/L — ABNORMAL HIGH (ref 0–35)
AST: 31 U/L (ref 0–37)
Albumin: 3.9 g/dL (ref 3.5–5.2)
Alkaline Phosphatase: 80 U/L (ref 39–117)
BUN: 14 mg/dL (ref 6–23)
CO2: 28 mEq/L (ref 19–32)
Calcium: 9.3 mg/dL (ref 8.4–10.5)
Chloride: 105 mEq/L (ref 96–112)
Creatinine, Ser: 0.7 mg/dL (ref 0.4–1.2)
GFR calc Af Amer: >60 mL/min (ref >60)
GFR calc non Af Amer: >60 mL/min (ref >60)
Glucose, Bld: 108 mg/dL — ABNORMAL HIGH (ref 70–99)
Potassium: 4.2 mEq/L (ref 3.5–5.1)
Sodium: 140 mEq/L (ref 135–145)
Total Bilirubin: 0.5 mg/dL (ref 0.3–1.2)
Total Protein: 5.9 g/dL — ABNORMAL LOW (ref 6.0–8.3)

## 2010-11-24 LAB — CBC
HCT: 34.5 % — ABNORMAL LOW (ref 36.0–46.0)
HCT: 34.9 % — ABNORMAL LOW (ref 36.0–46.0)
HCT: 36 % (ref 36.0–46.0)
Hemoglobin: 11.5 g/dL — ABNORMAL LOW (ref 12.0–15.0)
Hemoglobin: 11.7 g/dL — ABNORMAL LOW (ref 12.0–15.0)
Hemoglobin: 12 g/dL (ref 12.0–15.0)
MCHC: 33.4 g/dL (ref 30.0–36.0)
MCHC: 33.4 g/dL (ref 30.0–36.0)
MCHC: 33.5 g/dL (ref 30.0–36.0)
MCV: 96.5 fL (ref 78.0–100.0)
MCV: 96 fL (ref 78.0–100.0)
MCV: 97.5 fL (ref 78.0–100.0)
Platelets: 219 10*3/uL (ref 150–400)
Platelets: 211 10*3/uL (ref 150–400)
Platelets: 222 10*3/uL (ref 150–400)
RBC: 3.57 MIL/uL — ABNORMAL LOW (ref 3.87–5.11)
RBC: 3.58 MIL/uL — ABNORMAL LOW (ref 3.87–5.11)
RBC: 3.75 MIL/uL — ABNORMAL LOW (ref 3.87–5.11)
RDW: 15.9 % — ABNORMAL HIGH (ref 11.5–15.5)
RDW: 15.9 % — ABNORMAL HIGH (ref 11.5–15.5)
RDW: 16.1 % — ABNORMAL HIGH (ref 11.5–15.5)
WBC: 16.5 10*3/uL — ABNORMAL HIGH (ref 4.0–10.5)
WBC: 6 10*3/uL (ref 4.0–10.5)
WBC: 8 10*3/uL (ref 4.0–10.5)

## 2010-11-24 LAB — DIFFERENTIAL
Basophils Absolute: 0 10*3/uL (ref 0.0–0.1)
Basophils Absolute: 0 10*3/uL (ref 0.0–0.1)
Basophils Relative: 0 % (ref 0–1)
Basophils Relative: 0 % (ref 0–1)
Eosinophils Absolute: 0 10*3/uL (ref 0.0–0.7)
Eosinophils Absolute: 0 10*3/uL (ref 0.0–0.7)
Eosinophils Relative: 0 % (ref 0–5)
Eosinophils Relative: 0 % (ref 0–5)
Lymphocytes Relative: 3 % — ABNORMAL LOW (ref 12–46)
Lymphocytes Relative: 12 % (ref 12–46)
Lymphs Abs: 0.5 10*3/uL — ABNORMAL LOW (ref 0.7–4.0)
Lymphs Abs: 1 10*3/uL (ref 0.7–4.0)
Monocytes Absolute: 0.1 10*3/uL (ref 0.1–1.0)
Monocytes Absolute: 0.3 10*3/uL (ref 0.1–1.0)
Monocytes Relative: 0 % — ABNORMAL LOW (ref 3–12)
Monocytes Relative: 4 % (ref 3–12)
Neutro Abs: 15.9 10*3/uL — ABNORMAL HIGH (ref 1.7–7.7)
Neutro Abs: 6.7 10*3/uL (ref 1.7–7.7)
Neutrophils Relative %: 96 % — ABNORMAL HIGH (ref 43–77)
Neutrophils Relative %: 84 % — ABNORMAL HIGH (ref 43–77)

## 2010-11-24 LAB — GLUCOSE, CAPILLARY: Glucose-Capillary: 89 mg/dL (ref 70–99)

## 2010-11-24 LAB — PROTIME-INR
INR: 1 (ref 0.00–1.49)
Prothrombin Time: 13 seconds (ref 11.6–15.2)

## 2010-11-24 LAB — PREGNANCY, URINE: Preg Test, Ur: NEGATIVE

## 2010-11-24 LAB — APTT: aPTT: 30 seconds (ref 24–37)

## 2010-11-25 LAB — DIFFERENTIAL
Basophils Absolute: 0 10*3/uL (ref 0.0–0.1)
Basophils Relative: 1 % (ref 0–1)
Eosinophils Absolute: 0.1 10*3/uL (ref 0.0–0.7)
Eosinophils Relative: 2 % (ref 0–5)
Lymphocytes Relative: 13 % (ref 12–46)
Lymphs Abs: 0.7 10*3/uL (ref 0.7–4.0)
Monocytes Absolute: 0.4 10*3/uL (ref 0.1–1.0)
Monocytes Relative: 7 % (ref 3–12)
Neutro Abs: 4.1 10*3/uL (ref 1.7–7.7)
Neutrophils Relative %: 77 % (ref 43–77)

## 2010-11-25 LAB — COMPREHENSIVE METABOLIC PANEL
ALT: 48 U/L — ABNORMAL HIGH (ref 0–35)
AST: 33 U/L (ref 0–37)
Albumin: 3.3 g/dL — ABNORMAL LOW (ref 3.5–5.2)
Alkaline Phosphatase: 81 U/L (ref 39–117)
BUN: 15 mg/dL (ref 6–23)
CO2: 27 mEq/L (ref 19–32)
Calcium: 8.8 mg/dL (ref 8.4–10.5)
Chloride: 109 mEq/L (ref 96–112)
Creatinine, Ser: 0.73 mg/dL (ref 0.4–1.2)
GFR calc Af Amer: >60 mL/min (ref >60)
GFR calc non Af Amer: >60 mL/min (ref >60)
Glucose, Bld: 101 mg/dL — ABNORMAL HIGH (ref 70–99)
Potassium: 3.5 mEq/L (ref 3.5–5.1)
Sodium: 139 mEq/L (ref 135–145)
Total Bilirubin: 0.4 mg/dL (ref 0.3–1.2)
Total Protein: 5.6 g/dL — ABNORMAL LOW (ref 6.0–8.3)

## 2010-11-25 LAB — CBC
HCT: 31.1 % — ABNORMAL LOW (ref 36.0–46.0)
Hemoglobin: 10.4 g/dL — ABNORMAL LOW (ref 12.0–15.0)
MCHC: 33.5 g/dL (ref 30.0–36.0)
MCV: 97.5 fL (ref 78.0–100.0)
Platelets: 246 10*3/uL (ref 150–400)
RBC: 3.19 MIL/uL — ABNORMAL LOW (ref 3.87–5.11)
RDW: 17 % — ABNORMAL HIGH (ref 11.5–15.5)
WBC: 5.3 10*3/uL (ref 4.0–10.5)

## 2010-11-28 ENCOUNTER — Encounter (HOSPITAL_COMMUNITY): Payer: BC Managed Care – PPO | Attending: Oncology

## 2010-11-28 DIAGNOSIS — C50919 Malignant neoplasm of unspecified site of unspecified female breast: Secondary | ICD-10-CM

## 2010-11-28 DIAGNOSIS — Z79899 Other long term (current) drug therapy: Secondary | ICD-10-CM | POA: Insufficient documentation

## 2010-11-28 DIAGNOSIS — Z452 Encounter for adjustment and management of vascular access device: Secondary | ICD-10-CM

## 2010-12-23 NOTE — Op Note (Signed)
[name], [name]                ACCOUNT NO.:  [id number]   MEDICAL RECORD NO.:  [id number]          PATIENT TYPE:  AMB   LOCATION:  DAY                          FACILITY:  [facility]   PHYSICIAN:  Juanetta Gosling, MDDATE OF BIRTH:  01-04-1951   DATE OF PROCEDURE:  08/30/2008  DATE OF DISCHARGE:                               OPERATIVE REPORT   PREOPERATIVE DIAGNOSIS:  Stage 3 left breast cancer status post primary  systemic chemotherapy.   POSTOPERATIVE DIAGNOSIS:  Stage 3 left breast cancer status post primary  systemic chemotherapy.   PROCEDURE:  Left modified radical mastectomy.   SURGEON:  Juanetta Gosling, MD   ASSISTANT:  Anselm Pancoast. Zachery Dakins, M.D.   ANESTHESIA:  General.   SPECIMENS:  Left breast and axillary contents to pathology with a stitch  marking the axillary contents.   ESTIMATED BLOOD LOSS:  Minimal.   COMPLICATIONS:  None.   DRAINS:  Two 62 French Blake drains, one to the axilla and one to the  breast flaps.   DISPOSITION:  To recovery room in stable condition.   INDICATIONS FOR PROCEDURE:  [name] is a 60 year old female who was  noted to have advanced local regional breast cancer with a 4 cm mass  with associated axillary and supraclavicular adenopathy in September of  2009.  She underwent primary systemic therapy with Ladona Horns. Neijstrom, MD  and returned to undergo left modified radical mastectomy.  On MRI, the  lesion had shrank to about 1.1 x 1 cm in size with the adenopathy in her  axilla essentially gone.  She also has a recent PET scan that shows no  evidence of any disease activity.  On her examination, I could not  identify a mass.  She and I discussed possible breast conservation  therapy, but she elected to proceed with a left modified radical  mastectomy.   DESCRIPTION OF PROCEDURE:  After informed consent was obtained, the  patient was taken to the operating room.  She was administered 1 gram of  intravenous cefazolin.  Sequential  compression devices were placed on  her lower extremities prior to operation.  She was then placed under  general endotracheal anesthesia without complications.  Her left breast  and entire left arm were prepped and draped in a standard sterile  surgical fashion.  Surgical timeout was then performed.   The breast was identified and an elliptical incision that was equal in  both the superior and inferior portion was then made. Dissection carried  out down to the level of the breast tissue.  Flaps were raised  superiorly to the level of the clavicle, inferiorly to the level of the  abdominal musculature below the inframammary crease, medially to the  sternum, and laterally out into the axilla.  Inferiorly this area was  very stuck in the area where her cancer had been and we were able to  safely dissect all of this very well.  Following this the breast was  then removed from the pectoralis muscle including the fascia and then  rolled into the axilla.  Her axilla was noted  to have some scarring  present in it.  There were not really gross adenopathy present.  Her  axillary vein was identified.  Her long thoracic nerve and thoracodorsal  bundle were both identified and preserved as well.  The lymphatic bundle  was stripped down from below the axillary vein and removed as an entire  specimen.  Again both nerves were functioning upon completion.  Hemostasis was obtained.  Irrigation was performed.  A 19 French Blake  drain was placed under the mastectomy flaps as well as one into the  axilla.  The dermis was closed then with 3-0 Vicryl interrupted sutures  and the skin was closed with 4-0 Monocryl in subcuticular fashion.  Steri-Strips and a sterile dressing were placed over the wounds.  She  tolerated this well and was transferred to the recovery room in stable  condition.      Juanetta Gosling, MD  Electronically Signed     MCW/MEDQ  D:  08/30/2008  T:  08/30/2008  Job:   [id number]   cc:   Ladona Horns. Mariel Sleet, MD  Fax: [phone]   Mertha Finders., M.D.  Fax: [phone]

## 2010-12-23 NOTE — Op Note (Signed)
NAMEHUYEN, [name]                ACCOUNT NO.:  [id number]   MEDICAL RECORD NO.:  [id number]          PATIENT TYPE:  AMB   LOCATION:  DSC                          FACILITY:  [facility]   PHYSICIAN:  Juanetta Gosling, MDDATE OF BIRTH:  [birth year]   DATE OF PROCEDURE:  04/30/2008  DATE OF DISCHARGE:                               OPERATIVE REPORT   PREOPERATIVE DIAGNOSIS:  Breast cancer local regional, need for venous  access.   POSTOPERATIVE DIAGNOSIS:  Breast cancer local regional, need for venous  access.   PROCEDURE:  Right subclavian PowerPort insertion.   SURGEON:  Troy Sine. Dwain Sarna, MD   ASSISTANT:  Wilmon Arms. Corliss Skains, MD   ANESTHESIA:  General.   FINDINGS:  Catheter in good position on fluoro at the completion of  operation.   COMPLICATIONS:  None.   DRAINS:  None.   ESTIMATED BLOOD LOSS:  Minimal.   DISPOSITION:  To PACU in stable condition.   INDICATIONS:  [name] is a 60 year old female who otherwise healthy  notes a left axillary nodule.  She had a normal mammogram and normal  clinical exam of her breast, underwent a core biopsy of her left  axillary mass, which showed this to be carcinoma favoring breast  primary.  Following this, she underwent an MRI with note of a left  breast mass on her MRI that was subsequently biopsied and noted to be  left breast cancer.  She then further staged with a PET along with her  MRI with left axillary adenopathy, subpectoral nodes, and some  supraclavicular nodes indicating the advanced local regional disease.  We had a discussion about her in multidisciplinary breast conference and  the decision was to begin with chemotherapy given her disease.  I  counseled her for a right subclavian port placement today.   PROCEDURE:  After informed consent was obtained, the patient was taken  to the operating room.  She was placed under general anesthesia without  complication.  Her arms were then tucked and padded appropriately.   She  was then prepped and draped in a standard sterile surgical fashion.  She  was then placed in the Trendelenburg position.  A needle was then used  to access the subclavian vein on the first pass.  Wire was then inserted  into the vein and the needle was then removed.  Following this, a Bard  PowerPort was then selected to use .  Just below the insertion site, a  1.5-cm incision was then made and a pocket developed overlying the  pectoralis muscle.  The port was then inserted and sutured in position  with a 2-0 Prolene in 2 positions.  A #11 blade was used to further  large the insertion site and a snap was then placed in this to the port  area.  The catheter was then brought through.  The catheter was then  measured using fluoro and the position of the wire to be at the tip of  the atrium.  This was noted to be about 20 cm.  This was then cut.  The  peel-away sheath  was then inserted over the wire and then the wire  removed.  The catheter was then advanced through the peel-away sheath,  as it was peeled away and inserted completely.  Upon completion of this,  another fluoro was obtained with good position of the catheter at the  atriocaval junction.  The catheter aspirated blood and flushed easy at  the completion of this.  Following this, a 3-0 Vicryl was used to close  the dermis over the port and a 4-0 Monocryl was used to close the skin  in both places.  Dermabond was then placed over the wounds.  A syringe  with heparinized saline was then inserted into the port.  The blood was  aspirated again and then this was flushed with 5 mL of heparinized  saline.  She tolerated this well, was extubated in the operating, was  transferred to the PACU in stable condition.  A chest x-ray will be  obtained there prior to her discharge.  She will then follow up with Dr.  Mariel Sleet to begin her chemotherapy.      Juanetta Gosling, MD  Electronically Signed     MCW/MEDQ  D:  04/30/2008   T:  05/01/2008  Job:  [id number]   cc:   Ladona Horns. Mariel Sleet, MD

## 2010-12-26 NOTE — Op Note (Signed)
[name], GOVAN                          ACCOUNT NO.:  [id number]   MEDICAL RECORD NO.:  [id number]                   PATIENT TYPE:  OIB   LOCATION:  2852                                 FACILITY:  [facility]   PHYSICIAN:  Alford Highland. Rankin, M.D.                DATE OF BIRTH:  04-23-1951   DATE OF PROCEDURE:  04/10/2004  DATE OF DISCHARGE:                                 OPERATIVE REPORT   REFERRING PHYSICIAN:  Melvenia Needles, M.D.   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   PROCEDURES:  1.  Scleral buckle using two 4.0, 7.0 and two 8.7 elements right eye.  2.  Retinal cryopexy right eye.  3.  Aqueous paracentesis right eye.   SURGEON:  Alford Highland. Rankin, M.D.   ANESTHESIA:  General endotracheal anesthesia.   INDICATIONS FOR PROCEDURE:  The patient is a 60 year old woman who has  profound visual field loss and basic rhegmatogenous retinal detachment.  This is a pseudophakic right eye.  She has a previous history of cataract  extraction at an early age.  This understands this and attempt to reattach  the retina so as to allow preservation of visual field as well as to  preserve visual function.  She understands the risks of anesthesia including  the rare occurrence of death, loss of the eye including but not limited to  hemorrhage, infection, scarring, need for another surgery, no change in  vision, loss of vision and progressive disease despite intervention.   After appropriate signed consent was obtained, she was taken to the  operating room.  In the operating room, appropriate monitoring is followed  by general endotracheal anesthesia.  Right periocular region prepped and  draped in the usual  sterile fashion.  Lid speculum applied.  Conjunctival  peritomy was fashioned __________ .  Relaxing incision made in the  inferotemporal superonasal inferior quadrant.  The rectus muscles isolated  on 2-0 silk ties.  Indirect ophthalmoscopy was then used to deliver retinal  cryopexy in the bed of the detachment along the vitreous base inferonasally.  No other retinal holes or tears were identified.  Retinal hole in atrophic  nature was found at the 3:30 position.  Shallow subretinal fluid extended  posteriorly toward the optic nerve.  Two 8.7 soft Silicone __________ placed  under the medial rectus and inferior rectus muscles and two 4.0 encircling  band with a 7.0 Watzke's sleeve placed in superotemporal quadrant.  These  were tied temporarily.  At this time, indirect ophthalmoscopy was again  performed and confirmed that there was excellent indentation supporting the  peripheral retina and the retinal break and that  there was a shallow  subretinal fluid which did not allow safe removal from an external approach.  For this reason and because of the excellent support on the buckle, decision  was made to tie the buckle permanently.  Appropriate tension was applied.  To allow for excellent skull indentation, it was necessary to perform  aqueous paracentesis.  This was carried out without difficulty.   At this time, the bed of the buckle was irrigated with bug juice.  The  conjunctiva was then closed with 7-0 Vicryl sutures.  Subconjunctival  injection with antibiotic __________  applied.  Intraocular pressure  assessed and found to be adequate.  The patient tolerated the procedure well  without complications.                                               Alford Highland Rankin, M.D.    GAR/MEDQ  D:  04/10/2004  T:  04/11/2004  Job:  [id number]   cc:   Herby Abraham., M.D.  [address] [place]  [place] [address]  Fax: [phone]

## 2010-12-26 NOTE — Op Note (Signed)
[facility]  Patient:    [name], [name] Visit Number: [id number] MRN: [id number]          Service Type: SUR Location: [facility] 2550 10 Attending Physician:  Emeterio Reeve Dictated by:   Payton Doughty, M.D. Proc. Date: 08/30/01 Admit Date:  08/30/2001 Discharge Date: 08/30/2001                             Operative Report  PREOPERATIVE DIAGNOSIS:  Herniated disk at C6-7 left.  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  PROCEDURE:  C6-7 anterior cervical diskectomy and fusion with a Tether plate.  SURGEON:  Payton Doughty, M.D.  NURSE ASSISTANT:  [facility].  DOCTOR ASSISTANT:  Cristi Loron, M.D.  ANESTHESIA:  General endotracheal.  PREPARATION:  Sterile Betadine prep and scrub with alcohol wipe.  COMPLICATIONS:  None.  DESCRIPTION OF PROCEDURE:  This is a 60 year old right-handed white girl with a left C7 radiculopathy and a herniated disk at C7.  She was taken to the operating room and smoothly anesthetized and intubated, placed supine on the operating table in the Holter head traction with the neck extended.  Following shave, prep, and drape in the usual sterile fashion, the skin was incised from the midline in the medial border of the sternocleidomastoid muscle on the left side.  The platysma was identified, elevated, divided, and undermined.  The carotid tubercle was identified and the interspace below that was used. Intraoperative x-ray confirmed correctness of the level.  The longus colli was taken down bilaterally over the interspace.  The disk was then excised under gross observation.  A spreader was placed and the operating microscope brought in.  Using microdissection technique, the diskectomy was completed and the anterior epidural space dissected.  On the left side there was a large herniated disk extending out in the left C7 neural foramen.  This was removed without difficulty and the neural foramen carefully  explored and found to be free.  The posterior longitudinal ligament was divided.  The right neural foramen was also explored and found to be open.  The wound was irrigated and hemostasis assured.  A 7 mm bone graft was fashioned from patellar allograft and tapped into place.  A 14 mm Tether plate was then placed with 13 mm screws, two in C6 and two in C7.  Intraoperative x-ray showed good placement of bone graft, plate, and screws.  The wound was once again irrigated, hemostasis assured.  The platysma was reapproximated with 3-0 Vicryl in interrupted fashion, the subcutaneous tissue was reapproximated with 3-0 Vicryl in interrupted fashion, and the skin was closed with 4-0 Vicryl in a running subcuticular fashion.  Benzoin and Steri-Strips were placed, made occlusive with Telfa and OpSite.  The patient then placed in the Aspen collar and returned to the recovery room in good condition. Dictated by:   Payton Doughty, M.D. Attending Physician:  Emeterio Reeve DD:  08/30/01 TD:  09/01/01 Job: [id number]

## 2010-12-26 NOTE — H&P (Signed)
. [facility]  Patient:    [name], [name] Visit Number: [id number] MRN: [id number]          Service Type: SUR Location: [facility] 2869 01 Attending Physician:  Emeterio Reeve Dictated by:   Payton Doughty, M.D. Admit Date:  08/30/2001 Discharge Date: 08/30/2001                           History and Physical  ADMISSION DIAGNOSIS: Herniated disk, C6-7 on the left.  SERVICE: Neurosurgery.  HISTORY OF PRESENT ILLNESS: The patient is a 60 year old, right-handed white female, who has had cervical disk disease, had osteophytic difficulties over the past several years.  A couple of weeks ago, she got out of bed and had some pain in her neck, did a little bit of stretching, felt a slight pop and has had increasing left medial scapular pain down the left arm, numbness left hand, and worsening over the past couple of weeks. Medrol Dosepak was not helpful. MRI demonstrates a herniated disk at 6-7. She is admitted for diskectomy and fusion.  MEDICATIONS: She is not on oral nonsteroidal antiinflammatory medications. She is currently using Vicodin and Skelaxin, as well as Celebrex.  ALLERGIES: She has no allergies.  PAST MEDICAL HISTORY: She has no medical problems.  PAST SURGICAL HISTORY: A tubal ligation in 1983.  FAMILY HISTORY: Mother is 78 and in good health. Father is 8 and in reasonably good health with heart disease.  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  REVIEW OF SYSTEMS: Remarkable for wearing glasses, cataracts, heart murmur, indigestion, arm weakness, back pain, arm pain, and neck pain.  PHYSICAL EXAMINATION:  HEENT: Within normal limits.  NECK: Slightly ______, has limited range of motion in his neck causes discomfort down her neck and shoulder.  CHEST: Clear.  CARDIAC: No murmur but a midsystolic click and by history does have mitral valve prolapse in the  family.  ABDOMEN: Nontender. No hepatosplenomegaly.  EXTREMITIES: Without clubbing or cyanosis.  GENITOURINARY: Examination is deferred. Peripheral pulses are good.  NEUROLOGICAL: She is awake, alert and oriented.  Her cranial nerves are intact. Motor examination shows 5/5 strength throughout the upper and lower extremities except for her left triceps; she is 4/5 at best. She has a left C7 sensory deficit. Reflexes are 2 at the biceps, 1 at the right triceps, absent at the left, 1 at the brachioradialis. She has a positive Hoffmanns on the right and none at the left. Lower extremities are non-myopathic.  MRI shows a disk at 6-7 eccentric to the left with compression of the left C7 nerve root.  CLINICAL IMPRESSION: Left C7 radiculopathy secondary to herniated disk at C6, C7.  PLAN: For an anterior cervical diskectomy and fusion at C6, C7. The risks and benefits of this approach have been discussed with her and she wishes to proceed. Dictated by:   Payton Doughty, M.D. Attending Physician:  Emeterio Reeve DD:  08/30/01 TD:  08/31/01 Job: [id number]

## 2011-01-09 ENCOUNTER — Encounter (HOSPITAL_COMMUNITY): Payer: BC Managed Care – PPO | Attending: Oncology

## 2011-01-09 DIAGNOSIS — C50919 Malignant neoplasm of unspecified site of unspecified female breast: Secondary | ICD-10-CM | POA: Insufficient documentation

## 2011-01-09 DIAGNOSIS — Z79899 Other long term (current) drug therapy: Secondary | ICD-10-CM | POA: Insufficient documentation

## 2011-01-09 DIAGNOSIS — Z452 Encounter for adjustment and management of vascular access device: Secondary | ICD-10-CM

## 2011-01-26 ENCOUNTER — Other Ambulatory Visit (HOSPITAL_COMMUNITY): Payer: Self-pay

## 2011-01-27 ENCOUNTER — Encounter (HOSPITAL_COMMUNITY): Payer: Self-pay | Admitting: Oncology

## 2011-01-27 ENCOUNTER — Other Ambulatory Visit (HOSPITAL_COMMUNITY): Payer: Self-pay | Admitting: Oncology

## 2011-01-27 ENCOUNTER — Ambulatory Visit (HOSPITAL_COMMUNITY): Payer: Self-pay

## 2011-01-27 DIAGNOSIS — C50919 Malignant neoplasm of unspecified site of unspecified female breast: Secondary | ICD-10-CM

## 2011-01-27 DIAGNOSIS — M810 Age-related osteoporosis without current pathological fracture: Secondary | ICD-10-CM | POA: Insufficient documentation

## 2011-01-27 HISTORY — DX: Malignant neoplasm of unspecified site of unspecified female breast: C50.919

## 2011-01-27 MED ORDER — ZOLEDRONIC ACID 4 MG/5ML IV CONC: 4.0000 mg | Freq: Once | INTRAVENOUS | Status: DC

## 2011-02-14 ENCOUNTER — Other Ambulatory Visit (HOSPITAL_COMMUNITY): Payer: Self-pay | Admitting: Oncology

## 2011-02-14 DIAGNOSIS — C50919 Malignant neoplasm of unspecified site of unspecified female breast: Secondary | ICD-10-CM

## 2011-02-20 ENCOUNTER — Encounter (HOSPITAL_COMMUNITY): Payer: BC Managed Care – PPO | Attending: Oncology

## 2011-02-20 ENCOUNTER — Other Ambulatory Visit (HOSPITAL_COMMUNITY): Payer: BC Managed Care – PPO

## 2011-02-20 ENCOUNTER — Encounter (HOSPITAL_COMMUNITY): Payer: BC Managed Care – PPO

## 2011-02-20 ENCOUNTER — Ambulatory Visit (HOSPITAL_COMMUNITY): Payer: BC Managed Care – PPO

## 2011-02-20 DIAGNOSIS — C50919 Malignant neoplasm of unspecified site of unspecified female breast: Secondary | ICD-10-CM

## 2011-02-20 DIAGNOSIS — M810 Age-related osteoporosis without current pathological fracture: Secondary | ICD-10-CM | POA: Insufficient documentation

## 2011-02-20 LAB — DIFFERENTIAL
Basophils Absolute: 0 10*3/uL (ref 0.0–0.1)
Basophils Relative: 1 % (ref 0–1)
Eosinophils Absolute: 0.1 10*3/uL (ref 0.0–0.7)
Eosinophils Relative: 2 % (ref 0–5)
Lymphocytes Relative: 28 % (ref 12–46)
Lymphs Abs: 1.5 10*3/uL (ref 0.7–4.0)
Monocytes Absolute: 0.3 10*3/uL (ref 0.1–1.0)
Monocytes Relative: 6 % (ref 3–12)
Neutro Abs: 3.5 10*3/uL (ref 1.7–7.7)
Neutrophils Relative %: 64 % (ref 43–77)

## 2011-02-20 LAB — CBC
HCT: 39.5 % (ref 36.0–46.0)
Hemoglobin: 13.4 g/dL (ref 12.0–15.0)
MCH: 33.2 pg (ref 26.0–34.0)
MCHC: 33.9 g/dL (ref 30.0–36.0)
MCV: 97.8 fL (ref 78.0–100.0)
Platelets: 206 10*3/uL (ref 150–400)
RBC: 4.04 MIL/uL (ref 3.87–5.11)
RDW: 13.5 % (ref 11.5–15.5)
WBC: 5.5 10*3/uL (ref 4.0–10.5)

## 2011-02-20 LAB — COMPREHENSIVE METABOLIC PANEL
ALT: 21 U/L (ref 0–35)
AST: 27 U/L (ref 0–37)
Albumin: 3.8 g/dL (ref 3.5–5.2)
Alkaline Phosphatase: 81 U/L (ref 39–117)
BUN: 17 mg/dL (ref 6–23)
CO2: 29 mEq/L (ref 19–32)
Calcium: 9.5 mg/dL (ref 8.4–10.5)
Chloride: 103 mEq/L (ref 96–112)
Creatinine, Ser: 0.71 mg/dL (ref 0.50–1.10)
GFR calc Af Amer: >60 mL/min (ref >60)
GFR calc non Af Amer: >60 mL/min (ref >60)
Glucose, Bld: 141 mg/dL — ABNORMAL HIGH (ref 70–99)
Potassium: 3.8 mEq/L (ref 3.5–5.1)
Sodium: 137 mEq/L (ref 135–145)
Total Bilirubin: 0.4 mg/dL (ref 0.3–1.2)
Total Protein: 7.1 g/dL (ref 6.0–8.3)

## 2011-02-20 LAB — CANCER ANTIGEN 27.29: CA 27.29: 25 U/mL (ref 0–39)

## 2011-02-20 NOTE — Progress Notes (Signed)
[name] presented for Portacath access and flush. Proper placement of portacath confirmed by CXR. Portacath located right chest wall accessed with  H 20 needle. Good blood return present.  Specimen collected for labs. Portacath flushed with 20ml NS and 500U/2ml Heparin and needle removed intact. Procedure without incident. Patient tolerated procedure well.

## 2011-02-23 ENCOUNTER — Other Ambulatory Visit (HOSPITAL_COMMUNITY): Payer: Self-pay | Admitting: Oncology

## 2011-02-24 ENCOUNTER — Encounter (HOSPITAL_COMMUNITY): Payer: BC Managed Care – PPO

## 2011-02-24 ENCOUNTER — Encounter (HOSPITAL_COMMUNITY): Payer: BC Managed Care – PPO | Admitting: Oncology

## 2011-02-24 ENCOUNTER — Encounter (HOSPITAL_COMMUNITY): Payer: Self-pay | Admitting: Oncology

## 2011-02-24 VITALS — BP 160/88 | HR 64 | Temp 97.6°F | Wt 135.6 lb

## 2011-02-24 DIAGNOSIS — F329 Major depressive disorder, single episode, unspecified: Secondary | ICD-10-CM

## 2011-02-24 DIAGNOSIS — Z170 Estrogen receptor positive status [ER+]: Secondary | ICD-10-CM

## 2011-02-24 DIAGNOSIS — M810 Age-related osteoporosis without current pathological fracture: Secondary | ICD-10-CM

## 2011-02-24 DIAGNOSIS — C50919 Malignant neoplasm of unspecified site of unspecified female breast: Secondary | ICD-10-CM

## 2011-02-24 DIAGNOSIS — F32A Depression, unspecified: Secondary | ICD-10-CM

## 2011-02-24 DIAGNOSIS — F3289 Other specified depressive episodes: Secondary | ICD-10-CM

## 2011-02-24 MED ORDER — HEPARIN SOD (PORK) LOCK FLUSH 100 UNIT/ML IV SOLN
500.0000 [IU] | Freq: Once | INTRAVENOUS | Status: AC | PRN
  Administered 2011-02-24: 500 [IU]

## 2011-02-24 MED ORDER — SODIUM CHLORIDE 0.9 % IJ SOLN
10.0000 mL | INTRAMUSCULAR | Status: DC | PRN
  Administered 2011-02-24: 10 mL

## 2011-02-24 MED ORDER — ESCITALOPRAM OXALATE 10 MG PO TABS: 20.0000 mg | ORAL_TABLET | Freq: Every day | ORAL | Status: DC

## 2011-02-24 MED ORDER — SODIUM CHLORIDE 0.9 % IV SOLN
Freq: Once | INTRAVENOUS | Status: AC
  Administered 2011-02-24: 11:00:00 via INTRAVENOUS

## 2011-02-24 MED ORDER — HEPARIN SOD (PORK) LOCK FLUSH 100 UNIT/ML IV SOLN
INTRAVENOUS | Status: AC
  Administered 2011-02-24: 500 [IU]
  Filled 2011-02-24: qty 5

## 2011-02-24 MED ORDER — ZOLEDRONIC ACID 4 MG/5ML IV CONC
4.0000 mg | Freq: Once | INTRAVENOUS | Status: DC
  Filled 2011-02-24: qty 5

## 2011-02-24 NOTE — Patient Instructions (Addendum)
[facility]  Discharge Instructions  RECOMMENDATIONS MADE BY THE CONSULTANT AND ANY TEST RESULTS WILL BE SENT TO YOUR REFERRING DOCTOR.   EXAM FINDINGS BY MD TODAY AND SIGNS AND SYMPTOMS TO REPORT TO CLINIC OR PRIMARY MD: As per MD MEDICATIONS PRESCRIBED: Increased Lexapro and sent order to your pharmacy    SPECIAL INSTRUCTIONS/FOLLOW-UP: Return to Clinic on In 5 weeks on 8/21at 8:50am   I acknowledge that I have been informed and understand all the instructions given to me and received a copy. I do not have any more questions at this time, but understand that I may call the Specialty Clinic at [facility] at [phone] during business hours should I have any further questions or need assistance in obtaining follow-up care.    __________________________________________  _____________  __________ Signature of Patient or Authorized Representative            Date                   Time    __________________________________________ Nurse's Signature

## 2011-02-24 NOTE — Progress Notes (Signed)
This office note has been dictated.

## 2011-02-25 ENCOUNTER — Other Ambulatory Visit (HOSPITAL_COMMUNITY): Payer: Self-pay | Admitting: Oncology

## 2011-02-25 DIAGNOSIS — C50919 Malignant neoplasm of unspecified site of unspecified female breast: Secondary | ICD-10-CM

## 2011-02-25 DIAGNOSIS — R519 Headache, unspecified: Secondary | ICD-10-CM

## 2011-03-03 ENCOUNTER — Encounter (HOSPITAL_COMMUNITY): Payer: Self-pay

## 2011-03-03 ENCOUNTER — Encounter (HOSPITAL_COMMUNITY)
Admission: RE | Admit: 2011-03-03 | Discharge: 2011-03-03 | Disposition: A | Discharge status: Home / Self Care | Payer: BC Managed Care – PPO | Source: Ambulatory Visit | Attending: Oncology | Admitting: Oncology

## 2011-03-03 DIAGNOSIS — C50919 Malignant neoplasm of unspecified site of unspecified female breast: Secondary | ICD-10-CM | POA: Insufficient documentation

## 2011-03-03 DIAGNOSIS — Z9010 Acquired absence of unspecified breast and nipple: Secondary | ICD-10-CM | POA: Insufficient documentation

## 2011-03-03 LAB — GLUCOSE, CAPILLARY: Glucose-Capillary: 95 mg/dL (ref 70–99)

## 2011-03-03 MED ORDER — FLUDEOXYGLUCOSE F - 18 (FDG) INJECTION
17.4000 | Freq: Once | INTRAVENOUS | Status: AC | PRN
  Administered 2011-03-03: 17.4 via INTRAVENOUS

## 2011-03-04 ENCOUNTER — Telehealth (HOSPITAL_COMMUNITY): Payer: Self-pay | Admitting: *Deleted

## 2011-03-04 ENCOUNTER — Telehealth (HOSPITAL_COMMUNITY): Payer: Self-pay

## 2011-03-04 NOTE — Telephone Encounter (Signed)
He's on vacation, so that app't is fine with me.

## 2011-03-04 NOTE — Telephone Encounter (Signed)
Spoke with Dois Davenport. Relieved that PET scan was negative. Wanted Dr.Neijstrom to know that 1st available appt with [name] is Aug 6th. Said Dr.N may need to call [name] himself if he wants her to be seen sooner.

## 2011-03-04 NOTE — Telephone Encounter (Signed)
Message copied by Dennie Maizes on Wed Mar 04, 2011  8:51 AM ------      Message from: Mariel Sleet, ERIC S      Created: Tue Mar 03, 2011  5:25 PM       Call her-negative PET but I still want her to see me and Dwain Sarna.

## 2011-03-04 NOTE — Telephone Encounter (Signed)
Message left that appointment 8/6 with Dr. Dwain Sarna is ok with Dr. Mariel Sleet.  Patient to call back if any questions.

## 2011-03-16 ENCOUNTER — Encounter (INDEPENDENT_AMBULATORY_CARE_PROVIDER_SITE_OTHER): Payer: Self-pay | Admitting: General Surgery

## 2011-03-16 ENCOUNTER — Ambulatory Visit (INDEPENDENT_AMBULATORY_CARE_PROVIDER_SITE_OTHER): Payer: BC Managed Care – PPO | Admitting: General Surgery

## 2011-03-16 VITALS — BP 140/72 | HR 64 | Temp 97.9°F

## 2011-03-16 DIAGNOSIS — R599 Enlarged lymph nodes, unspecified: Secondary | ICD-10-CM

## 2011-03-16 DIAGNOSIS — R590 Localized enlarged lymph nodes: Secondary | ICD-10-CM

## 2011-03-16 NOTE — Progress Notes (Signed)
[name] is a 60 y.o. female.    Chief Complaint  Patient presents with  . 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 This was completed in 2010. She underwent radiation therapy as well as preoperative chemotherapy. She has been on Femara since then. She's been followed by Dr. Mariel Sleet since then and he let me know couple of weeks ago that he found a left supraclavicular node. She came to see me today in followup for that. She has had a PET scan in the interim which showed no evidence of any disease per her report as well. She reports she has no complaints except for the she's a little bit more anxious a more fatigued of late but has no other significant complaints. Past Medical History  Diagnosis Date  . Osteoporosis 01/27/2011  . Borderline hypertension   . Pneumonia   . Bladder infection   . Kidney infection   . Fatigue   . Generalized headaches   . Wears glasses   . Swollen lymph nodes   . Retinal detachment   . Reflux   . Adenocarcinoma of breast 01/27/2011    stage IIIc breast cancer    Past Surgical History  Procedure Date  . Cataract extraction   . Cervical disc fusioni   . Retinal detachment and repair   . Btl   . Mastectomy modified radical   . Port-a-cath removal     Family History  Problem Relation Age of Onset  . Hypertension Mother   . Heart disease Mother     atrial fib  . Heart disease Father   . Other Father     glaucoma    Social History History  Substance Use Topics  . Smoking status: Never Smoker   . Smokeless tobacco: Not on file  . Alcohol Use: 4.2 oz/week    7 Glasses of wine per week    No Known Allergies  Current Outpatient Prescriptions  Medication Sig Dispense Refill  . Calcium Carbonate-Vit D-Min (CALCIUM 1200 PO) Take by mouth daily.        . Cholecalciferol (VITAMIN D PO) Take by mouth daily.          . diphenhydrAMINE (SOMINEX) 25 MG tablet Take 25 mg by mouth at bedtime as needed.       Marland Kitchen escitalopram (LEXAPRO) 10 MG tablet Take 15 mg by mouth daily.        . folic acid (FOLVITE) 400 MCG tablet Take 400 mcg by mouth daily.        Marland Kitchen letrozole (FEMARA) 2.5 MG tablet Take 2.5 mg by mouth daily.        . fish oil-omega-3 fatty acids 1000 MG capsule Take 1 g by mouth daily.        . magnesium 30 MG tablet Take 250-500 mg by mouth daily.          Review of Systems Review of Systems  Constitutional: Positive for malaise/fatigue.  HENT: Negative.   Eyes: Negative.   Respiratory: Negative.   Cardiovascular: Negative.   Gastrointestinal: Negative.   Genitourinary: Negative.   Musculoskeletal: Negative.   Skin: Negative.   Neurological: Negative.   Endo/Heme/Allergies: Negative.   Psychiatric/Behavioral: The patient is nervous/anxious.     Physical Exam Physical Exam  Constitutional: She appears well-developed and well-nourished.  Neck: Neck supple.  Respiratory:  Lymphadenopathy:    Cervical adenopathy: small about 5 mm supraclavicular node that is mobile, nontender but easily palpable.     Blood pressure 140/72, pulse 64, temperature 97.9 F (36.6 C).  Assessment/Plan History of stage IIIc left breast cancer Supraclavicular adenopathy  She certainly still has this lymph node that is enlarged which is easily palpable. Even though she has a negative PET scan I think with her disease previously she needs to proceed with a biopsy of this node. This she certainly is at risk for failure at this site. I talked to her about a supraclavicular node biopsy with the risks being but not limited to bleeding, infection, nerve injury, vascular injury. She understands this and we'll proceed soon.  Beckie Viscardi 03/16/2011, 3:34 PM

## 2011-03-27 ENCOUNTER — Encounter (HOSPITAL_BASED_OUTPATIENT_CLINIC_OR_DEPARTMENT_OTHER)
Admission: RE | Admit: 2011-03-27 | Discharge: 2011-03-27 | Disposition: A | Discharge status: Home / Self Care | Payer: BC Managed Care – PPO | Source: Ambulatory Visit | Attending: General Surgery | Admitting: General Surgery

## 2011-03-27 LAB — DIFFERENTIAL
Basophils Absolute: 0 10*3/uL (ref 0.0–0.1)
Basophils Relative: 0 % (ref 0–1)
Eosinophils Absolute: 0 10*3/uL (ref 0.0–0.7)
Eosinophils Relative: 1 % (ref 0–5)
Lymphocytes Relative: 24 % (ref 12–46)
Lymphs Abs: 1.5 10*3/uL (ref 0.7–4.0)
Monocytes Absolute: 0.5 10*3/uL (ref 0.1–1.0)
Monocytes Relative: 8 % (ref 3–12)
Neutro Abs: 4.2 10*3/uL (ref 1.7–7.7)
Neutrophils Relative %: 68 % (ref 43–77)

## 2011-03-27 LAB — BASIC METABOLIC PANEL
BUN: 15 mg/dL (ref 6–23)
CO2: 26 mEq/L (ref 19–32)
Calcium: 9.7 mg/dL (ref 8.4–10.5)
Chloride: 103 mEq/L (ref 96–112)
Creatinine, Ser: 0.59 mg/dL (ref 0.50–1.10)
GFR calc Af Amer: >60 mL/min (ref >60)
GFR calc non Af Amer: >60 mL/min (ref >60)
Glucose, Bld: 92 mg/dL (ref 70–99)
Potassium: 4.8 mEq/L (ref 3.5–5.1)
Sodium: 137 mEq/L (ref 135–145)

## 2011-03-27 LAB — CBC
HCT: 39.3 % (ref 36.0–46.0)
Hemoglobin: 13.2 g/dL (ref 12.0–15.0)
MCH: 32.4 pg (ref 26.0–34.0)
MCHC: 33.6 g/dL (ref 30.0–36.0)
MCV: 96.3 fL (ref 78.0–100.0)
Platelets: 191 10*3/uL (ref 150–400)
RBC: 4.08 MIL/uL (ref 3.87–5.11)
RDW: 13.2 % (ref 11.5–15.5)
WBC: 6.2 10*3/uL (ref 4.0–10.5)

## 2011-03-30 ENCOUNTER — Other Ambulatory Visit (INDEPENDENT_AMBULATORY_CARE_PROVIDER_SITE_OTHER): Payer: Self-pay | Admitting: General Surgery

## 2011-03-30 ENCOUNTER — Ambulatory Visit (HOSPITAL_BASED_OUTPATIENT_CLINIC_OR_DEPARTMENT_OTHER)
Admission: RE | Admit: 2011-03-30 | Discharge: 2011-03-30 | Disposition: A | Discharge status: Home / Self Care | Payer: BC Managed Care – PPO | Source: Ambulatory Visit | Attending: General Surgery | Admitting: General Surgery

## 2011-03-30 DIAGNOSIS — Z01810 Encounter for preprocedural cardiovascular examination: Secondary | ICD-10-CM | POA: Insufficient documentation

## 2011-03-30 DIAGNOSIS — C770 Secondary and unspecified malignant neoplasm of lymph nodes of head, face and neck: Secondary | ICD-10-CM | POA: Insufficient documentation

## 2011-03-30 DIAGNOSIS — Z9010 Acquired absence of unspecified breast and nipple: Secondary | ICD-10-CM | POA: Insufficient documentation

## 2011-03-30 DIAGNOSIS — Z981 Arthrodesis status: Secondary | ICD-10-CM | POA: Insufficient documentation

## 2011-03-30 DIAGNOSIS — Z01812 Encounter for preprocedural laboratory examination: Secondary | ICD-10-CM | POA: Insufficient documentation

## 2011-03-30 DIAGNOSIS — C50919 Malignant neoplasm of unspecified site of unspecified female breast: Secondary | ICD-10-CM | POA: Insufficient documentation

## 2011-03-30 DIAGNOSIS — F430 Acute stress reaction: Secondary | ICD-10-CM | POA: Insufficient documentation

## 2011-03-30 DIAGNOSIS — C773 Secondary and unspecified malignant neoplasm of axilla and upper limb lymph nodes: Secondary | ICD-10-CM

## 2011-03-31 ENCOUNTER — Ambulatory Visit (HOSPITAL_COMMUNITY): Payer: BC Managed Care – PPO | Admitting: Oncology

## 2011-03-31 NOTE — Op Note (Signed)
[name], [name]                ACCOUNT NO.:  [id number]  MEDICAL RECORD NO.:  [id number]  LOCATION:                                 FACILITY:  PHYSICIAN:  Juanetta Gosling, MDDATE OF BIRTH:  03-10-1951  DATE OF PROCEDURE: DATE OF DISCHARGE:                              OPERATIVE REPORT   PREOPERATIVE DIAGNOSES: 1. History of stage IIIC left breast cancer, status post modified     radical mastectomy, chemotherapy, and radiation therapy. 2. Left supraclavicular adenopathy.  POSTOPERATIVE DIAGNOSES: 1. History of stage IIIC left breast cancer, status post modified     radical mastectomy, chemotherapy, and radiation therapy. 2. Left supraclavicular adenopathy.  PROCEDURE:  Left supraclavicular node excisional biopsy.  SURGEON:  Juanetta Gosling, MD  ASSISTANT:  None.  ANESTHESIA:  General.  SUPERVISING ANESTHESIOLOGIST:  Janetta Hora. Gelene Mink, MD  SPECIMEN:  Left supraclavicular node to pathology.  ESTIMATED BLOOD LOSS:  Minimal.  COMPLICATIONS:  None.  DRAINS:  None.  DISPOSITION:  To recovery room in stable condition.  INDICATIONS:  This is a 60 year old female who I know from taking care of her for stage IIIC adenocarcinoma of the left breast.  Her treatment was completed in 2010.  She has been maintained in Femara.  She had a left supraclavicular node palpated by Dr. Mariel Sleet and referred her down for evaluation for a biopsy.  She had a negative PET scan, but that think this thing was just too small to be seen by a PET scan.  I certainly could identified on her exam and I discussed that biopsy of this area given the history, I do not think a radiologic biopsy was possible due to its positioning and its size.  PROCEDURE:  After informed consent was obtained, the patient was taken to the operating room.  She was administered 1 gram of intravenous cefazolin.  Sequential compression devices were placed on lower extremities prior to induction with  anesthesia.  She was then placed on general anesthesia without complication.  Her neck was then prepped and draped in standard sterile surgical fashion.  A surgical time-out was then performed.  I identified the area where the node was.  I made an incision in a transverse fashion on crease of her neck overlying this.  I then carried this down to the level of the platysma.  I spread the platysma open. The node was very difficult to identify, it was only about 5 mm in size even open.  I eventually was able to identify the node that one large draining lymphatics coming from it that lymphatic fluid coming from it, so I oversewed this with a 3-0 Vicryl and I placed two clips on this as well.  Once this was controlled completely, there was no further leakage of any lymphatic fluid.  We were just below the platysma with this as well and clearly did appear to that lymphatic was going into the node that I was working with.  I then excised the lymph node and passed this off the table as specimen.  Hemostasis was observed.  I irrigated this, it was clean.  I closed the platysma with a 3-0 Vicryl, the dermis with a  3-0 Vicryl, the skin with a 4-0 Monocryl and placed Dermabond over this.  I infiltrated 10 mL of 0.25% Marcaine.  She tolerated this well, was extubated in the operating room and transferred to the recovery room in stable condition.     Juanetta Gosling, MD     MCW/MEDQ  D:  03/30/2011  T:  03/30/2011  Job:  [id number]  cc:   Catalina Pizza, M.D. Ladona Horns. Mariel Sleet, MD  Electronically Signed by Emelia Loron MD on 03/31/2011 06:53:54 PM

## 2011-04-03 ENCOUNTER — Encounter (HOSPITAL_COMMUNITY): Payer: BC Managed Care – PPO | Admitting: Oncology

## 2011-04-03 ENCOUNTER — Encounter (HOSPITAL_COMMUNITY): Payer: BC Managed Care – PPO | Attending: Oncology

## 2011-04-03 ENCOUNTER — Encounter (HOSPITAL_COMMUNITY): Payer: Self-pay | Admitting: Oncology

## 2011-04-03 VITALS — BP 159/79 | HR 77 | Temp 98.1°F | Wt 133.6 lb

## 2011-04-03 DIAGNOSIS — C50919 Malignant neoplasm of unspecified site of unspecified female breast: Secondary | ICD-10-CM

## 2011-04-03 MED ORDER — HEPARIN SOD (PORK) LOCK FLUSH 100 UNIT/ML IV SOLN
500.0000 [IU] | Freq: Once | INTRAVENOUS | Status: AC
  Administered 2011-04-03: 500 [IU] via INTRAVENOUS

## 2011-04-03 MED ORDER — HEPARIN SOD (PORK) LOCK FLUSH 100 UNIT/ML IV SOLN
INTRAVENOUS | Status: AC
  Administered 2011-04-03: 500 [IU] via INTRAVENOUS
  Filled 2011-04-03: qty 5

## 2011-04-03 MED ORDER — SODIUM CHLORIDE 0.9 % IJ SOLN
10.0000 mL | Freq: Once | INTRAMUSCULAR | Status: AC
  Administered 2011-04-03: 10 mL via INTRAVENOUS

## 2011-04-03 MED ORDER — TAMOXIFEN CITRATE 10 MG PO TABS: 20.0000 mg | ORAL_TABLET | ORAL | Status: DC

## 2011-04-03 MED ORDER — SODIUM CHLORIDE 0.9 % IJ SOLN
INTRAMUSCULAR | Status: AC
  Administered 2011-04-03: 10 mL via INTRAVENOUS
  Filled 2011-04-03: qty 10

## 2011-04-03 NOTE — Progress Notes (Unsigned)
[name] presented for Portacath access and flush. Proper placement of portacath confirmed by CXR. Portacath located right chest wall accessed with  H 20 needle. Good blood return present. Portacath flushed with 20ml NS and 500U/5ml Heparin and needle removed intact. Procedure without incident. Patient tolerated procedure well.   

## 2011-04-03 NOTE — Patient Instructions (Signed)
[facility]  Discharge Instructions  RECOMMENDATIONS MADE BY THE CONSULTANT AND ANY TEST RESULTS WILL BE SENT TO YOUR REFERRING DOCTOR.   EXAM FINDINGS BY MD TODAY AND SIGNS AND SYMPTOMS TO REPORT TO CLINIC OR PRIMARY MD: Will stop Femara and will put you on a different anti-hormonal  MEDICATIONS PRESCRIBED: Tamoxifen Follow label directions  INSTRUCTIONS GIVEN AND DISCUSSED: Other : Report any problems with Tamoxifen such as lower leg swelling, sudden chest pain or unusual shortness of breath  SPECIAL INSTRUCTIONS/FOLLOW-UP: Return to Clinic on : As scheduled.   I acknowledge that I have been informed and understand all the instructions given to me and received a copy. I do not have any more questions at this time, but understand that I may call the Specialty Clinic at [facility] at [phone] during business hours should I have any further questions or need assistance in obtaining follow-up care.    __________________________________________  _____________  __________ Signature of Patient or Authorized Representative            Date                   Time    __________________________________________ Nurse's Signature

## 2011-04-03 NOTE — Progress Notes (Signed)
This office note has been dictated.

## 2011-04-03 NOTE — Progress Notes (Signed)
CC:   Juanetta Gosling, MD Catalina Pizza, M.D. Lurline Hare, M.D. Freddy Finner, M.D.  DIAGNOSIS:  Recurrent adenocarcinoma of the left breast with a positive left supraclavicular lymph node biopsy by Dr. Harden Mo on 03/30/2011.  Maliaka is here today to go over of course the positive biopsy.  When I saw her earlier in the month, she actually had a lymph node that was very small, perhaps about 3 mm across, perhaps 4.  We did do a PET scan on her prior to the biopsy which showed no evidence of breast cancer recurrence interestingly.  The lymph node, however, was very firm, indurated, very worrisome for recurrent disease, so I sent her to Dr. Dwain Sarna who also felt the node and took it out, as I mentioned above, on the on the 20th.  She therefore has recurrent disease in a very isolated location, namely the left subclavicular fossa.  It is ER-positive 100%, PR-positive 14%.  I did not see that they did HER2 in this report, but it sounds like they submitted it but it is not back yet, so I will call and see if we have the details for that.  They should be out very soon, but in the meantime it is time to stop the Femara and start tamoxifen 20 mg once a day.  I will see her in 8 weeks and will go from there.  I went over some side effects with her of the tamoxifen.  We E prescribed it to her local pharmacy, and she will start it today.  The generic is fine with me,  of course  She wanted to know whether her life expectancy is able to be determined and I told her no.  We certainly do not have widespread metastatic disease by any means on the PET scan, so we just have to take it 1 month at a time and see where we go from here.  I think when I see her in 8 weeks I will just do a physical exam and no blood work.  I think we have not been seeing an elevation of CA 27.29.  In fact, it was normal when I saw her on July 13.    ______________________________ Ladona Horns. Mariel Sleet,  MD ESN/MEDQ  D:  04/03/2011  T:  04/03/2011  Job:  [id number]

## 2011-04-15 ENCOUNTER — Other Ambulatory Visit (HOSPITAL_COMMUNITY): Payer: Self-pay | Admitting: Oncology

## 2011-04-15 DIAGNOSIS — C50919 Malignant neoplasm of unspecified site of unspecified female breast: Secondary | ICD-10-CM

## 2011-04-15 MED ORDER — TAMOXIFEN CITRATE 20 MG PO TABS: 20.0000 mg | ORAL_TABLET | Freq: Every day | ORAL | Status: DC

## 2011-04-21 ENCOUNTER — Ambulatory Visit (INDEPENDENT_AMBULATORY_CARE_PROVIDER_SITE_OTHER): Payer: BC Managed Care – PPO | Admitting: General Surgery

## 2011-04-21 ENCOUNTER — Encounter (INDEPENDENT_AMBULATORY_CARE_PROVIDER_SITE_OTHER): Payer: Self-pay | Admitting: General Surgery

## 2011-04-21 VITALS — BP 132/82 | HR 60

## 2011-04-21 DIAGNOSIS — Z09 Encounter for follow-up examination after completed treatment for conditions other than malignant neoplasm: Secondary | ICD-10-CM

## 2011-04-21 NOTE — Progress Notes (Signed)
Subjective:     Patient ID: [name], female   DOB: Jan 29, 1951, 60 y.o.   MRN: [id number]  HPI This is a 60 year old female I know well from her prior treatment of a left breast cancer. She saw me recently with a left supraclavicular node. I excised this node and this does show recurrence of her breast cancer in her left supraclavicular node that is hormone receptor positive HER-2/neu negative. She's doing well postoperatively without any complaints. She is back on her normal activities. She has seen Dr. Mariel Sleet and is on tamoxifen and the plan is now just to follow her closely.  Review of Systems     Objective:   Physical Exam Well healed left neck incision    Assessment:     Recurrent left breast cancer with [name] node positive    Plan:        To return to normal activity after biopsy. She's getting treated with tamoxifen and is planning on followed up with Dr. Mariel Sleet frequently which is easy for her. I asked her to come back and see me as needed or Dr. Mariel Sleet he is here he can call me.

## 2011-05-11 LAB — DIFFERENTIAL
Basophils Absolute: 0.1
Basophils Absolute: 0
Basophils Absolute: 0
Basophils Relative: 1
Basophils Relative: 0
Basophils Relative: 1
Eosinophils Absolute: 0.1
Eosinophils Absolute: 0
Eosinophils Absolute: 0.1
Eosinophils Relative: 1
Eosinophils Relative: 1
Eosinophils Relative: 1
Lymphocytes Relative: 25
Lymphocytes Relative: 20
Lymphocytes Relative: 27
Lymphs Abs: 2.1
Lymphs Abs: 1.7
Lymphs Abs: 1.9
Monocytes Absolute: 0.3
Monocytes Absolute: 0.4
Monocytes Absolute: 0.5
Monocytes Relative: 4
Monocytes Relative: 5
Monocytes Relative: 7
Neutro Abs: 5.9
Neutro Abs: 4.8
Neutro Abs: 6.4
Neutrophils Relative %: 70
Neutrophils Relative %: 66
Neutrophils Relative %: 74

## 2011-05-11 LAB — COMPREHENSIVE METABOLIC PANEL
ALT: 17
ALT: 17
AST: 25
AST: 22
Albumin: 4
Albumin: 4.3
Alkaline Phosphatase: 72
Alkaline Phosphatase: 69
BUN: 11
BUN: 13
CO2: 27
CO2: 27
Calcium: 9.3
Calcium: 9.6
Chloride: 102
Chloride: 103
Creatinine, Ser: 0.67
Creatinine, Ser: 0.77
GFR calc Af Amer: >60
GFR calc Af Amer: >60
GFR calc non Af Amer: >60
GFR calc non Af Amer: >60
Glucose, Bld: 129 — ABNORMAL HIGH
Glucose, Bld: 88
Potassium: 4.4
Potassium: 3.9
Sodium: 136
Sodium: 136
Total Bilirubin: 0.7
Total Bilirubin: 0.7
Total Protein: 6.8
Total Protein: 7.3

## 2011-05-11 LAB — CBC
HCT: 39.8
HCT: 38.9
HCT: 41.7
Hemoglobin: 13.5
Hemoglobin: 13.4
Hemoglobin: 14.2
MCHC: 33.8
MCHC: 34
MCHC: 34.3
MCV: 96.1
MCV: 95.9
MCV: 96.6
Platelets: 229
Platelets: 225
Platelets: 234
RBC: 4.15
RBC: 4.03
RBC: 4.34
RDW: 12.8
RDW: 12.6
RDW: 13.3
WBC: 8.4
WBC: 7.3
WBC: 8.7

## 2011-05-11 LAB — LACTATE DEHYDROGENASE: LDH: 176

## 2011-05-11 LAB — APTT: aPTT: 31

## 2011-05-11 LAB — PROTIME-INR
INR: 1
Prothrombin Time: 13.3

## 2011-05-11 LAB — BASIC METABOLIC PANEL
BUN: 15
CO2: 27
Calcium: 9.7
Chloride: 106
Creatinine, Ser: 0.78
GFR calc Af Amer: >60
GFR calc non Af Amer: >60
Glucose, Bld: 92
Potassium: 4
Sodium: 140

## 2011-05-11 LAB — CANCER ANTIGEN 27.29
CA 27.29: 26
CA 27.29: 25

## 2011-05-12 LAB — CBC
HCT: 34.8 — ABNORMAL LOW
HCT: 37.5
HCT: 33.6 % — ABNORMAL LOW (ref 36.0–46.0)
HCT: 29.6 % — ABNORMAL LOW (ref 36.0–46.0)
HCT: 32.6 % — ABNORMAL LOW (ref 36.0–46.0)
Hemoglobin: 11.8 — ABNORMAL LOW
Hemoglobin: 12.9
Hemoglobin: 11.4 g/dL — ABNORMAL LOW (ref 12.0–15.0)
Hemoglobin: 10.3 g/dL — ABNORMAL LOW (ref 12.0–15.0)
Hemoglobin: 11 g/dL — ABNORMAL LOW (ref 12.0–15.0)
MCHC: 33.8
MCHC: 34.4
MCHC: 34 g/dL (ref 30.0–36.0)
MCHC: 33.7 g/dL (ref 30.0–36.0)
MCHC: 34.6 g/dL (ref 30.0–36.0)
MCV: 95
MCV: 95
MCV: 95.4 fL (ref 78.0–100.0)
MCV: 95.1 fL (ref 78.0–100.0)
MCV: 97.3 fL (ref 78.0–100.0)
Platelets: 148 — ABNORMAL LOW
Platelets: 205
Platelets: 222 10*3/uL (ref 150–400)
Platelets: 206 10*3/uL (ref 150–400)
Platelets: 270 10*3/uL (ref 150–400)
RBC: 3.67 — ABNORMAL LOW
RBC: 3.95
RBC: 3.53 MIL/uL — ABNORMAL LOW (ref 3.87–5.11)
RBC: 3.11 MIL/uL — ABNORMAL LOW (ref 3.87–5.11)
RBC: 3.35 MIL/uL — ABNORMAL LOW (ref 3.87–5.11)
RDW: 12.9
RDW: 13.1
RDW: 14.4 % (ref 11.5–15.5)
RDW: 15.9 % — ABNORMAL HIGH (ref 11.5–15.5)
RDW: 16.1 % — ABNORMAL HIGH (ref 11.5–15.5)
WBC: 12.5 — ABNORMAL HIGH
WBC: 9.7
WBC: 12.1 10*3/uL — ABNORMAL HIGH (ref 4.0–10.5)
WBC: 2.6 10*3/uL — ABNORMAL LOW (ref 4.0–10.5)
WBC: 27.8 10*3/uL — ABNORMAL HIGH (ref 4.0–10.5)

## 2011-05-12 LAB — DIFFERENTIAL
Basophils Absolute: 0
Basophils Absolute: 0
Basophils Absolute: 0 10*3/uL (ref 0.0–0.1)
Basophils Absolute: 0 10*3/uL (ref 0.0–0.1)
Basophils Absolute: 0.1 10*3/uL (ref 0.0–0.1)
Basophils Relative: 0
Basophils Relative: 0
Basophils Relative: 0 % (ref 0–1)
Basophils Relative: 0 % (ref 0–1)
Basophils Relative: 1 % (ref 0–1)
Eosinophils Absolute: 0
Eosinophils Absolute: 0
Eosinophils Absolute: 0 10*3/uL (ref 0.0–0.7)
Eosinophils Absolute: 0 10*3/uL (ref 0.0–0.7)
Eosinophils Absolute: 0 10*3/uL (ref 0.0–0.7)
Eosinophils Relative: 0
Eosinophils Relative: 0
Eosinophils Relative: 0 % (ref 0–5)
Eosinophils Relative: 0 % (ref 0–5)
Eosinophils Relative: 0 % (ref 0–5)
Lymphocytes Relative: 12
Lymphocytes Relative: 20
Lymphocytes Relative: 11 % — ABNORMAL LOW (ref 12–46)
Lymphocytes Relative: 24 % (ref 12–46)
Lymphocytes Relative: 3 % — ABNORMAL LOW (ref 12–46)
Lymphs Abs: 1.5
Lymphs Abs: 1.9
Lymphs Abs: 1.4 10*3/uL (ref 0.7–4.0)
Lymphs Abs: 0.6 10*3/uL — ABNORMAL LOW (ref 0.7–4.0)
Lymphs Abs: 0.9 10*3/uL (ref 0.7–4.0)
Monocytes Absolute: 0.5
Monocytes Absolute: 0.6
Monocytes Absolute: 0.7 10*3/uL (ref 0.1–1.0)
Monocytes Absolute: 0.1 10*3/uL (ref 0.1–1.0)
Monocytes Absolute: 0.7 10*3/uL (ref 0.1–1.0)
Monocytes Relative: 5
Monocytes Relative: 5
Monocytes Relative: 5 % (ref 3–12)
Monocytes Relative: 3 % (ref 3–12)
Monocytes Relative: 3 % (ref 3–12)
Neutro Abs: 10.3 — ABNORMAL HIGH
Neutro Abs: 7.3
Neutro Abs: 10 10*3/uL — ABNORMAL HIGH (ref 1.7–7.7)
Neutro Abs: 1.9 10*3/uL (ref 1.7–7.7)
Neutro Abs: 26.1 10*3/uL — ABNORMAL HIGH (ref 1.7–7.7)
Neutrophils Relative %: 75
Neutrophils Relative %: 83 — ABNORMAL HIGH
Neutrophils Relative %: 83 % — ABNORMAL HIGH (ref 43–77)
Neutrophils Relative %: 73 % (ref 43–77)
Neutrophils Relative %: 94 % — ABNORMAL HIGH (ref 43–77)
WBC Morphology: INCREASED

## 2011-05-12 LAB — COMPREHENSIVE METABOLIC PANEL
ALT: 15
ALT: 35
ALT: 33 U/L (ref 0–35)
ALT: 32 U/L (ref 0–35)
AST: 21
AST: 26
AST: 23 U/L (ref 0–37)
AST: 27 U/L (ref 0–37)
Albumin: 3.9
Albumin: 4
Albumin: 4.1 g/dL (ref 3.5–5.2)
Albumin: 4.2 g/dL (ref 3.5–5.2)
Alkaline Phosphatase: 70
Alkaline Phosphatase: 77
Alkaline Phosphatase: 84 U/L (ref 39–117)
Alkaline Phosphatase: 105 U/L (ref 39–117)
BUN: 10
BUN: 12
BUN: 8 mg/dL (ref 6–23)
BUN: 10 mg/dL (ref 6–23)
CO2: 26
CO2: 27
CO2: 27 mEq/L (ref 19–32)
CO2: 25 mEq/L (ref 19–32)
Calcium: 9.1
Calcium: 9.1
Calcium: 9.1 mg/dL (ref 8.4–10.5)
Calcium: 9.5 mg/dL (ref 8.4–10.5)
Chloride: 103
Chloride: 104
Chloride: 103 mEq/L (ref 96–112)
Chloride: 106 mEq/L (ref 96–112)
Creatinine, Ser: 0.65
Creatinine, Ser: 0.67
Creatinine, Ser: 0.71 mg/dL (ref 0.4–1.2)
Creatinine, Ser: 0.79 mg/dL (ref 0.4–1.2)
GFR calc Af Amer: >60
GFR calc Af Amer: >60
GFR calc Af Amer: >60 mL/min (ref >60)
GFR calc Af Amer: >60 mL/min (ref >60)
GFR calc non Af Amer: >60
GFR calc non Af Amer: >60
GFR calc non Af Amer: >60 mL/min (ref >60)
GFR calc non Af Amer: >60 mL/min (ref >60)
Glucose, Bld: 117 — ABNORMAL HIGH
Glucose, Bld: 118 — ABNORMAL HIGH
Glucose, Bld: 112 mg/dL — ABNORMAL HIGH (ref 70–99)
Glucose, Bld: 138 mg/dL — ABNORMAL HIGH (ref 70–99)
Potassium: 3.7
Potassium: 3.8
Potassium: 4 mEq/L (ref 3.5–5.1)
Potassium: 3.7 mEq/L (ref 3.5–5.1)
Sodium: 136
Sodium: 138
Sodium: 137 mEq/L (ref 135–145)
Sodium: 139 mEq/L (ref 135–145)
Total Bilirubin: 0.4
Total Bilirubin: 0.6
Total Bilirubin: 0.4 mg/dL (ref 0.3–1.2)
Total Bilirubin: 0.5 mg/dL (ref 0.3–1.2)
Total Protein: 6.3
Total Protein: 6.6
Total Protein: 6.3 g/dL (ref 6.0–8.3)
Total Protein: 6.6 g/dL (ref 6.0–8.3)

## 2011-05-13 LAB — GLUCOSE, CAPILLARY: Glucose-Capillary: 70

## 2011-05-15 ENCOUNTER — Encounter (HOSPITAL_COMMUNITY): Payer: BC Managed Care – PPO | Attending: Oncology

## 2011-05-15 DIAGNOSIS — C50919 Malignant neoplasm of unspecified site of unspecified female breast: Secondary | ICD-10-CM

## 2011-05-15 DIAGNOSIS — Z452 Encounter for adjustment and management of vascular access device: Secondary | ICD-10-CM

## 2011-05-15 LAB — CBC
HCT: 31.5 % — ABNORMAL LOW (ref 36.0–46.0)
HCT: 31.8 % — ABNORMAL LOW (ref 36.0–46.0)
HCT: 35.8 % — ABNORMAL LOW (ref 36.0–46.0)
Hemoglobin: 10.8 g/dL — ABNORMAL LOW (ref 12.0–15.0)
Hemoglobin: 11 g/dL — ABNORMAL LOW (ref 12.0–15.0)
Hemoglobin: 11.9 g/dL — ABNORMAL LOW (ref 12.0–15.0)
MCHC: 33.3 g/dL (ref 30.0–36.0)
MCHC: 34.1 g/dL (ref 30.0–36.0)
MCHC: 34.9 g/dL (ref 30.0–36.0)
MCV: 94.2 fL (ref 78.0–100.0)
MCV: 95.2 fL (ref 78.0–100.0)
MCV: 96.4 fL (ref 78.0–100.0)
Platelets: 250 10*3/uL (ref 150–400)
Platelets: 284 10*3/uL (ref 150–400)
Platelets: 318 10*3/uL (ref 150–400)
RBC: 3.34 MIL/uL — ABNORMAL LOW (ref 3.87–5.11)
RBC: 3.34 MIL/uL — ABNORMAL LOW (ref 3.87–5.11)
RBC: 3.72 MIL/uL — ABNORMAL LOW (ref 3.87–5.11)
RDW: 15.7 % — ABNORMAL HIGH (ref 11.5–15.5)
RDW: 15.9 % — ABNORMAL HIGH (ref 11.5–15.5)
RDW: 16.7 % — ABNORMAL HIGH (ref 11.5–15.5)
WBC: 1.9 10*3/uL — ABNORMAL LOW (ref 4.0–10.5)
WBC: 4.7 10*3/uL (ref 4.0–10.5)
WBC: 8.2 10*3/uL (ref 4.0–10.5)

## 2011-05-15 LAB — COMPREHENSIVE METABOLIC PANEL
ALT: 36 U/L — ABNORMAL HIGH (ref 0–35)
ALT: 41 U/L — ABNORMAL HIGH (ref 0–35)
AST: 29 U/L (ref 0–37)
AST: 32 U/L (ref 0–37)
Albumin: 3.9 g/dL (ref 3.5–5.2)
Albumin: 4.1 g/dL (ref 3.5–5.2)
Alkaline Phosphatase: 65 U/L (ref 39–117)
Alkaline Phosphatase: 70 U/L (ref 39–117)
BUN: 11 mg/dL (ref 6–23)
BUN: 13 mg/dL (ref 6–23)
CO2: 23 mEq/L (ref 19–32)
CO2: 24 mEq/L (ref 19–32)
Calcium: 9.2 mg/dL (ref 8.4–10.5)
Calcium: 9.4 mg/dL (ref 8.4–10.5)
Chloride: 105 mEq/L (ref 96–112)
Chloride: 108 mEq/L (ref 96–112)
Creatinine, Ser: 0.67 mg/dL (ref 0.4–1.2)
Creatinine, Ser: 0.72 mg/dL (ref 0.4–1.2)
GFR calc Af Amer: >60 mL/min (ref >60)
GFR calc Af Amer: >60 mL/min (ref >60)
GFR calc non Af Amer: >60 mL/min (ref >60)
GFR calc non Af Amer: >60 mL/min (ref >60)
Glucose, Bld: 122 mg/dL — ABNORMAL HIGH (ref 70–99)
Glucose, Bld: 128 mg/dL — ABNORMAL HIGH (ref 70–99)
Potassium: 3.7 mEq/L (ref 3.5–5.1)
Potassium: 4.1 mEq/L (ref 3.5–5.1)
Sodium: 136 mEq/L (ref 135–145)
Sodium: 137 mEq/L (ref 135–145)
Total Bilirubin: 0.5 mg/dL (ref 0.3–1.2)
Total Bilirubin: 0.7 mg/dL (ref 0.3–1.2)
Total Protein: 6.2 g/dL (ref 6.0–8.3)
Total Protein: 6.3 g/dL (ref 6.0–8.3)

## 2011-05-15 LAB — DIFFERENTIAL
Band Neutrophils: 0 % (ref 0–10)
Band Neutrophils: 0 % (ref 0–10)
Basophils Absolute: 0 10*3/uL (ref 0.0–0.1)
Basophils Absolute: 0 10*3/uL (ref 0.0–0.1)
Basophils Absolute: 0.1 10*3/uL (ref 0.0–0.1)
Basophils Relative: 0 % (ref 0–1)
Basophils Relative: 0 % (ref 0–1)
Basophils Relative: 1 % (ref 0–1)
Blasts: 0 %
Blasts: 0 %
Eosinophils Absolute: 0 10*3/uL (ref 0.0–0.7)
Eosinophils Absolute: 0 10*3/uL (ref 0.0–0.7)
Eosinophils Absolute: 0 10*3/uL (ref 0.0–0.7)
Eosinophils Relative: 0 % (ref 0–5)
Eosinophils Relative: 0 % (ref 0–5)
Eosinophils Relative: 0 % (ref 0–5)
Lymphocytes Relative: 13 % (ref 12–46)
Lymphocytes Relative: 19 % (ref 12–46)
Lymphocytes Relative: 8 % — ABNORMAL LOW (ref 12–46)
Lymphs Abs: 0.4 10*3/uL — ABNORMAL LOW (ref 0.7–4.0)
Lymphs Abs: 0.4 10*3/uL — ABNORMAL LOW (ref 0.7–4.0)
Lymphs Abs: 1.1 10*3/uL (ref 0.7–4.0)
Metamyelocytes Relative: 0 %
Metamyelocytes Relative: 0 %
Monocytes Absolute: 0.5 10*3/uL (ref 0.1–1.0)
Monocytes Absolute: 0.7 10*3/uL (ref 0.1–1.0)
Monocytes Absolute: 0.7 10*3/uL (ref 0.1–1.0)
Monocytes Relative: 16 % — ABNORMAL HIGH (ref 3–12)
Monocytes Relative: 24 % — ABNORMAL HIGH (ref 3–12)
Monocytes Relative: 9 % (ref 3–12)
Myelocytes: 0 %
Myelocytes: 0 %
Neutro Abs: 1 10*3/uL — ABNORMAL LOW (ref 1.7–7.7)
Neutro Abs: 3.6 10*3/uL (ref 1.7–7.7)
Neutro Abs: 6.3 10*3/uL (ref 1.7–7.7)
Neutrophils Relative %: 57 % (ref 43–77)
Neutrophils Relative %: 77 % (ref 43–77)
Neutrophils Relative %: 77 % (ref 43–77)
Promyelocytes Absolute: 0 %
Promyelocytes Absolute: 0 %
nRBC: 0 /100 WBC
nRBC: 0 /100 WBC

## 2011-05-15 LAB — URINALYSIS, ROUTINE W REFLEX MICROSCOPIC
Bilirubin Urine: NEGATIVE
Glucose, UA: NEGATIVE mg/dL
Ketones, ur: NEGATIVE mg/dL
Leukocytes, UA: NEGATIVE
Nitrite: NEGATIVE
Protein, ur: NEGATIVE mg/dL
Specific Gravity, Urine: <1.005 — ABNORMAL LOW (ref 1.005–1.030)
Urobilinogen, UA: 0.2 mg/dL (ref 0.0–1.0)
pH: 6.5 (ref 5.0–8.0)

## 2011-05-15 LAB — URINE MICROSCOPIC-ADD ON

## 2011-05-15 LAB — CULTURE, RESPIRATORY W GRAM STAIN
Culture: NORMAL
Gram Stain: NONE SEEN

## 2011-05-15 LAB — CULTURE, RESPIRATORY

## 2011-05-15 MED ORDER — SODIUM CHLORIDE 0.9 % IJ SOLN
INTRAMUSCULAR | Status: AC
  Administered 2011-05-15: 10 mL via INTRAVENOUS
  Filled 2011-05-15: qty 10

## 2011-05-15 MED ORDER — HEPARIN SOD (PORK) LOCK FLUSH 100 UNIT/ML IV SOLN
500.0000 [IU] | Freq: Once | INTRAVENOUS | Status: AC
  Administered 2011-05-15: 500 [IU] via INTRAVENOUS
  Filled 2011-05-15: qty 5

## 2011-05-15 MED ORDER — HEPARIN SOD (PORK) LOCK FLUSH 100 UNIT/ML IV SOLN
INTRAVENOUS | Status: AC
  Administered 2011-05-15: 500 [IU] via INTRAVENOUS
  Filled 2011-05-15: qty 5

## 2011-05-15 MED ORDER — SODIUM CHLORIDE 0.9 % IJ SOLN
10.0000 mL | INTRAMUSCULAR | Status: DC | PRN
  Administered 2011-05-15: 10 mL via INTRAVENOUS
  Filled 2011-05-15: qty 10

## 2011-05-15 NOTE — Progress Notes (Signed)
Esly C Haroon presented for Portacath access and flush. Proper placement of portacath confirmed by CXR. Portacath located right chest wall accessed with  H 20 needle. Good blood return present. Portacath flushed with 20ml NS and 500U/5ml Heparin and needle removed intact. Procedure without incident. Patient tolerated procedure well.   

## 2011-05-30 DIAGNOSIS — C50919 Malignant neoplasm of unspecified site of unspecified female breast: Secondary | ICD-10-CM

## 2011-05-30 DIAGNOSIS — R5382 Chronic fatigue, unspecified: Secondary | ICD-10-CM

## 2011-05-30 DIAGNOSIS — M949 Disorder of cartilage, unspecified: Secondary | ICD-10-CM

## 2011-05-30 DIAGNOSIS — M899 Disorder of bone, unspecified: Secondary | ICD-10-CM

## 2011-05-30 DIAGNOSIS — G9332 Myalgic encephalomyelitis/chronic fatigue syndrome: Secondary | ICD-10-CM

## 2011-06-09 ENCOUNTER — Encounter (HOSPITAL_COMMUNITY): Payer: BC Managed Care – PPO | Admitting: Oncology

## 2011-06-09 VITALS — BP 131/80 | HR 61 | Temp 98.3°F | Ht 65.0 in | Wt 137.0 lb

## 2011-06-09 DIAGNOSIS — C50919 Malignant neoplasm of unspecified site of unspecified female breast: Secondary | ICD-10-CM

## 2011-06-09 NOTE — Progress Notes (Signed)
This office note has been dictated.

## 2011-06-09 NOTE — Patient Instructions (Signed)
[facility]  Discharge Instructions  RECOMMENDATIONS MADE BY THE CONSULTANT AND ANY TEST RESULTS WILL BE SENT TO YOUR REFERRING DOCTOR.   EXAM FINDINGS BY MD TODAY AND SIGNS AND SYMPTOMS TO REPORT TO CLINIC OR PRIMARY MD:   Return in 3 months to see Dr. Mariel Sleet  I acknowledge that I have been informed and understand all the instructions given to me and received a copy. I do not have any more questions at this time, but understand that I may call the Specialty Clinic at [facility] at [phone] during business hours should I have any further questions or need assistance in obtaining follow-up care.    __________________________________________  _____________  __________ Signature of Patient or Authorized Representative            Date                   Time    __________________________________________ Nurse's Signature

## 2011-06-18 ENCOUNTER — Emergency Department (HOSPITAL_COMMUNITY)
Admission: EM | Admit: 2011-06-18 | Discharge: 2011-06-18 | Disposition: A | Discharge status: Home / Self Care | Payer: BC Managed Care – PPO | Attending: Emergency Medicine | Admitting: Emergency Medicine

## 2011-06-18 ENCOUNTER — Emergency Department (HOSPITAL_COMMUNITY): Payer: BC Managed Care – PPO

## 2011-06-18 ENCOUNTER — Encounter (HOSPITAL_COMMUNITY): Payer: Self-pay | Admitting: Emergency Medicine

## 2011-06-18 DIAGNOSIS — S20219A Contusion of unspecified front wall of thorax, initial encounter: Secondary | ICD-10-CM | POA: Insufficient documentation

## 2011-06-18 DIAGNOSIS — R0789 Other chest pain: Secondary | ICD-10-CM | POA: Insufficient documentation

## 2011-06-18 NOTE — ED Notes (Signed)
Pt states was thrown off a horse 2 days ago, denies LOC, however she did hurt her head , neck,  And hip during fall.  Denies injury at this time to those areas.

## 2011-06-18 NOTE — ED Provider Notes (Signed)
History     CSN: [id number] Arrival date & time: 06/18/2011 10:55 AM   First MD Initiated Contact with Patient 06/18/11 1118      Chief Complaint  Patient presents with  . Chest Pain    (Consider location/radiation/quality/duration/timing/severity/associated sxs/prior treatment) Patient is a 60 y.o. female presenting with chest pain. No language interpreter was used.  Chest Pain Episode onset: 2 days ago. Chest pain occurs intermittently. The chest pain is unchanged. The pain is associated with breathing, coughing, exertion and lifting. At its most intense, the pain is at 6/10. The pain is currently at 4/10. The quality of the pain is described as sharp and stabbing. The pain does not radiate. Chest pain is worsened by certain positions and deep breathing. Pertinent negatives for primary symptoms include no fever, no shortness of breath and no wheezing.  Pertinent negatives for associated symptoms include no diaphoresis. She tried nothing for the symptoms. Past medical history comments: cancer pt.     Past Medical History  Diagnosis Date  . Osteoporosis 01/27/2011  . Borderline hypertension   . Pneumonia   . Bladder infection   . Kidney infection   . Fatigue   . Generalized headaches   . Wears glasses   . Swollen lymph nodes   . Retinal detachment   . Reflux   . 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 Breast cancer     Past Surgical History  Procedure Date  . Cataract extraction   . Cervical disc fusioni   . Retinal detachment and repair   . Btl   . Mastectomy modified radical   . Port-a-cath removal     in place (not removed)  . Biopsy of lymph node     super clavicle  . Breast surgery     Family History  Problem Relation Age of Onset  . Hypertension Mother   . Heart disease Mother     atrial fib  . Heart disease Father   . Other Father     glaucoma    History  Substance Use Topics  . Smoking status: Never Smoker   .  Smokeless tobacco: Not on file  . Alcohol Use: 4.2 oz/week    7 Glasses of wine per week    OB History    Grav Para Term Preterm Abortions TAB SAB Ect Mult Living                  Review of Systems  Constitutional: Negative for fever, chills and diaphoresis.  Respiratory: Negative for shortness of breath, wheezing and stridor.   Cardiovascular: Positive for chest pain.  All other systems reviewed and are negative.    Allergies  Review of patient's allergies indicates no known allergies.  Home Medications   Current Outpatient Rx  Name Route Sig Dispense Refill  . CALCIUM 1200 PO Oral Take by mouth daily.      Marland Kitchen VITAMIN D PO Oral Take by mouth daily.      Marland Kitchen DIPHENHYDRAMINE HCL (SLEEP) 25 MG PO TABS Oral Take 25 mg by mouth at bedtime as needed. To help with sleeping    . ESCITALOPRAM OXALATE 10 MG PO TABS Oral Take 15 mg by mouth daily.     . OMEGA-3 FATTY ACIDS 1000 MG PO CAPS Oral Take 1 g by mouth daily.      Marland Kitchen FOLIC ACID 400 MCG PO TABS Oral Take 400 mcg by mouth daily.      Marland Kitchen  IBUPROFEN 200 MG PO TABS Oral Take 200 mg by mouth every 6 (six) hours as needed.      Marland Kitchen MAGNESIUM 30 MG PO TABS Oral Take 250-500 mg by mouth daily.      Marland Kitchen TAMOXIFEN CITRATE 20 MG PO TABS Oral Take 20 mg by mouth daily.       BP 159/85  Pulse 66  Temp(Src) 98.3 F (36.8 C) (Oral)  Resp 18  Ht 5\' 5"  (1.651 m)  Wt 137 lb (62.143 kg)  BMI 22.80 kg/m2  SpO2 100%  Physical Exam  Nursing note and vitals reviewed. Constitutional: She is oriented to person, place, and time. She appears well-developed and well-nourished. No distress.  HENT:  Head: Normocephalic and atraumatic.  Eyes: EOM are normal.  Neck: Normal range of motion.  Cardiovascular: Normal rate, regular rhythm and normal heart sounds.   Pulmonary/Chest: Effort normal and breath sounds normal. She has no wheezes.   She exhibits tenderness.  Abdominal: Soft. She exhibits no distension. There is no tenderness.  Musculoskeletal:  Normal range of motion. She exhibits tenderness.  Neurological: She is alert and oriented to person, place, and time.  Skin: Skin is warm and dry. She is not diaphoretic.  Psychiatric: She has a normal mood and affect. Judgment normal.    ED Course  Procedures (including critical care time)  Labs Reviewed - No data to display Dg Ribs Unilateral W/chest Right  06/18/2011  *RADIOLOGY REPORT*  Clinical Data: 60 year old with right posterior rib pain after fall.  RIGHT RIBS AND CHEST - 3+ VIEW  Comparison: Chest radiograph 05/01/2011  Findings: Chest radiograph demonstrates a right Port-A-Cath with tip in overlying the SVC.  Lungs are clear without pneumothorax. There may be old fractures of the left eighth and ninth ribs.  No evidence for a displaced right rib fracture.  IMPRESSION: No acute chest findings.  No evidence for a displaced right rib fracture.  Original Report Authenticated By: Richarda Overlie, M.D.     No diagnosis found.    MDM          Worthy Rancher, PA 06/18/11 2159

## 2011-06-18 NOTE — ED Notes (Signed)
Fell off horse x 2 days ago. C/o r side rib pain. Denies loc.nad.

## 2011-06-22 NOTE — ED Provider Notes (Signed)
Medical screening examination/treatment/procedure(s) were performed by non-physician practitioner and as supervising physician I was immediately available for consultation/collaboration.  Nicoletta Dress. Colon Branch, MD 06/22/11 1734

## 2011-06-26 ENCOUNTER — Encounter (HOSPITAL_COMMUNITY): Payer: BC Managed Care – PPO | Attending: Oncology

## 2011-06-26 DIAGNOSIS — C50119 Malignant neoplasm of central portion of unspecified female breast: Secondary | ICD-10-CM

## 2011-06-26 DIAGNOSIS — Z452 Encounter for adjustment and management of vascular access device: Secondary | ICD-10-CM

## 2011-06-26 DIAGNOSIS — C50919 Malignant neoplasm of unspecified site of unspecified female breast: Secondary | ICD-10-CM

## 2011-06-26 MED ORDER — SODIUM CHLORIDE 0.9 % IN NEBU
INHALATION_SOLUTION | RESPIRATORY_TRACT | Status: AC
  Filled 2011-06-26: qty 3

## 2011-06-26 MED ORDER — HEPARIN SOD (PORK) LOCK FLUSH 100 UNIT/ML IV SOLN
INTRAVENOUS | Status: AC
  Administered 2011-06-26: 500 [IU] via INTRAVENOUS
  Filled 2011-06-26: qty 5

## 2011-06-26 MED ORDER — HEPARIN SOD (PORK) LOCK FLUSH 100 UNIT/ML IV SOLN
500.0000 [IU] | Freq: Once | INTRAVENOUS | Status: AC
  Administered 2011-06-26: 500 [IU] via INTRAVENOUS
  Filled 2011-06-26: qty 5

## 2011-06-26 MED ORDER — SODIUM CHLORIDE 0.9 % IJ SOLN
INTRAMUSCULAR | Status: AC
  Administered 2011-06-26: 10 mL via INTRAVENOUS
  Filled 2011-06-26: qty 10

## 2011-06-26 MED ORDER — SODIUM CHLORIDE 0.9 % IJ SOLN
10.0000 mL | INTRAMUSCULAR | Status: DC | PRN
  Administered 2011-06-26: 10 mL via INTRAVENOUS
  Filled 2011-06-26: qty 10

## 2011-06-26 NOTE — Progress Notes (Signed)
Tolerated flush well.  Good blood return.

## 2011-06-30 ENCOUNTER — Other Ambulatory Visit (HOSPITAL_COMMUNITY): Payer: Self-pay | Admitting: Oncology

## 2011-07-06 NOTE — Progress Notes (Signed)
CC:   Juanetta Gosling, MD Catalina Pizza, M.D. Lurline Hare, M.D. Freddy Finner, M.D.  DIAGNOSIS: 1. Recurrent left-sided breast cancer status post lymph node biopsy     from the left supraclavicular fossa, biopsy proven to be breast     cancer, still ER positive, PR positive, HER2/neu negative.  Her     receptors at this time are ER positive at a level of 100%, PR     receptors 14%, and again, HER2/neu was not over expressed.  That     biopsy took place on 03/30/2011.  She has been placed on tamoxifen     since that positive diagnosis.  She is here today for routine     followup.  I actually think she is tolerating the tamoxifen better     than she tolerated the Femara.  So again, her original cancer was     ER positive 100%, PR positive 5%, HER2/neu was negative by IHC but     positive by [facility], interestingly.  This time, of course, the cancer     is ER positive, PR positive, and this time the tumor is HER2/neu     negative by [facility].  She has another recurrence.  I think we are     going to have to keep the HER2/neu test in mind.  We may need to do     more pathological biopsy proof if she has recurrence since she was     HER2/neu positive 1 time. 2. Stage IIIC adenocarcinoma of the left breast presenting with     infiltrating ductal carcinoma, ER positive, PR positive and     HER2/neu positive by [facility], status post dose-dense epirubicin and     Cytoxan followed by Taxotere, each for 4 cycles.  She then went on     to have mastectomy, still had a 3 mm area of invasive tumor; all     the other areas were smaller.  8 nodes out of 8 were, however,     involved, and that surgery took place on 08/30/2008.  She had     postoperative radiation and a total of 12 months of Herceptin,     started the Femara after the radiation. 3. History of herpes zoster of the right 4th dermatome. 4. Chronic fatigue.  She is about 90% of normal, she states. 5. Osteopenia/osteoporosis, on therapy.  She  looks very good today, and again, she states she is doing a little bit better with the tamoxifen than she did with the Femara.  She sleeps well.  She is working full time essentially.  Her medications are still basically the same, some calcium, vitamin D, a little Sominex at night, she uses Lexapro 15 mg daily, some folic acid, magnesium, ibuprofen occasionally and some fish oil.  The tamoxifen she takes 20 mg a day.  PHYSICAL EXAMINATION:  Vital Signs:  All very stable.  General:  She looks in no acute distress.  She is alert.  She is oriented. Extremities:  Her legs are without edema.  She has a little puffiness of the left forearm.  Nodes:  She has no adenopathy in the cervical, supraclavicular, infraclavicular, axillary, or inguinal areas.  Lungs: Clear.  Heart:  Shows a regular rhythm and rate without obvious murmur, rub, or gallop.  Abdomen:  Soft and nontender without organomegaly. Bowel sounds are normal.  Breasts:  Left chest wall is clear.  The right breast is negative.  Her port is intact.  So  she looks great.  We will continue the tamoxifen.  I will see her in 12 weeks, sooner if need be.  I do not think I need any blood work today.    ______________________________ Ladona Horns. Mariel Sleet, MD ESN/MEDQ  D:  06/09/2011  T:  06/10/2011  Job:  [id number]

## 2011-08-07 ENCOUNTER — Encounter (HOSPITAL_COMMUNITY): Payer: BC Managed Care – PPO | Attending: Oncology

## 2011-08-07 DIAGNOSIS — Z452 Encounter for adjustment and management of vascular access device: Secondary | ICD-10-CM

## 2011-08-07 DIAGNOSIS — C50919 Malignant neoplasm of unspecified site of unspecified female breast: Secondary | ICD-10-CM

## 2011-08-07 DIAGNOSIS — C50119 Malignant neoplasm of central portion of unspecified female breast: Secondary | ICD-10-CM

## 2011-08-07 MED ORDER — SODIUM CHLORIDE 0.9 % IJ SOLN
INTRAMUSCULAR | Status: AC
  Administered 2011-08-07: 10 mL via INTRAVENOUS
  Filled 2011-08-07: qty 10

## 2011-08-07 MED ORDER — HEPARIN SOD (PORK) LOCK FLUSH 100 UNIT/ML IV SOLN
INTRAVENOUS | Status: AC
  Administered 2011-08-07: 500 [IU] via INTRAVENOUS
  Filled 2011-08-07: qty 5

## 2011-08-07 MED ORDER — HEPARIN SOD (PORK) LOCK FLUSH 100 UNIT/ML IV SOLN
500.0000 [IU] | Freq: Once | INTRAVENOUS | Status: AC
  Administered 2011-08-07: 500 [IU] via INTRAVENOUS
  Filled 2011-08-07: qty 5

## 2011-08-07 MED ORDER — SODIUM CHLORIDE 0.9 % IJ SOLN
10.0000 mL | INTRAMUSCULAR | Status: DC | PRN
  Administered 2011-08-07: 10 mL via INTRAVENOUS
  Filled 2011-08-07: qty 10

## 2011-08-07 NOTE — Progress Notes (Signed)
[name] presented for Portacath access and flush. Proper placement of portacath confirmed by CXR. Portacath located right chest wall accessed with  H 20 needle. Good blood return present. Portacath flushed with 20ml NS and 500U/5ml Heparin and needle removed intact. Procedure without incident. Patient tolerated procedure well.   

## 2011-09-08 ENCOUNTER — Encounter (HOSPITAL_COMMUNITY): Payer: BC Managed Care – PPO | Attending: Oncology | Admitting: Oncology

## 2011-09-08 VITALS — BP 145/78 | HR 76 | Temp 98.3°F | Wt 133.5 lb

## 2011-09-08 DIAGNOSIS — M810 Age-related osteoporosis without current pathological fracture: Secondary | ICD-10-CM

## 2011-09-08 DIAGNOSIS — C50919 Malignant neoplasm of unspecified site of unspecified female breast: Secondary | ICD-10-CM | POA: Insufficient documentation

## 2011-09-08 DIAGNOSIS — Z170 Estrogen receptor positive status [ER+]: Secondary | ICD-10-CM

## 2011-09-08 DIAGNOSIS — C50519 Malignant neoplasm of lower-outer quadrant of unspecified female breast: Secondary | ICD-10-CM

## 2011-09-08 DIAGNOSIS — C773 Secondary and unspecified malignant neoplasm of axilla and upper limb lymph nodes: Secondary | ICD-10-CM

## 2011-09-08 NOTE — Progress Notes (Signed)
This office note has been dictated.

## 2011-09-08 NOTE — Progress Notes (Signed)
CC:   Catalina Pizza, M.D. Juanetta Gosling, MD Lurline Hare, M.D. Freddy Finner, M.D.  DIAGNOSES: 1. Recurrent left-sided breast cancer status post left supraclavicular     lymph node biopsy proven to be recurrent disease, still ER     positive, PR positive, but HER-2/neu negative.  ER receptors this     time were 100%, PR receptor was 14%, HER-2 not over expressed.     Biopsy took place on 03/30/2011, and I placed her on tamoxifen     since then.  She is here for routine follow-up, tolerating the drug     very well.  She states it is easier to tolerate than the Femara. 2. 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  Positive     for HER-2 at the time of presentation.  We treated her with dose     dense epirubicin and Cytoxan followed by Taxotere each for 4     cycles, and she also received the Herceptin for 52 weeks.  At the     time of surgery she had 3 mm area of invasive tumor.  All areas     were smaller.  8/8 nodes however were still involved and that     surgery took place on 08/30/2008.  She went on to have     postoperative radiation therapy to the chest wall, supraclavicular     and axillary nodal areas.  She started the Femara after the     radiation. 3. History of herpes zoster of the right 4th dermatome. 4. Chronic fatigue though that is much improved. 5. Osteopenia/osteoporosis on therapy.  She looks great today.  She is still fully active.  No complaints on review of systems really.  Her vital signs show that she has stability of her weight.  She is usually in the 124-137 range.  She is 133 pounds today.  Blood pressure 145/78 right arm sitting position, pulse 76 and regular, respirations 16 and unlabored.  She again has no pain.  She has a little scar on the left supraclavicular fossa sitting on top of the artery  which is easy to palpate through that, but no distinct nodes are present in the cervical, supraclavicular, infraclavicular, axillary or inguinal areas.  She has no hepatosplenomegaly.  Bowel sounds are normal.  Lungs are clear to auscultation and percussion.  Her heart, I thought showed a grade 1/6 systolic ejection murmur of this time.  She has no peripheral edema.  She looks great.  Will continue the tamoxifen for right now.  I do not think we need to initiate anti HER 2 therapy since her most recent lymph node biopsy did not have HER 2 over expression.  I will see her in 3 months.    ______________________________ Ladona Horns. Mariel Sleet, MD ESN/MEDQ  D:  09/08/2011  T:  09/08/2011  Job:  [id number]

## 2011-09-08 NOTE — Patient Instructions (Signed)
[facility]  Discharge Instructions  RECOMMENDATIONS MADE BY THE CONSULTANT AND ANY TEST RESULTS WILL BE SENT TO YOUR REFERRING DOCTOR.   EXAM FINDINGS BY MD TODAY AND SIGNS AND SYMPTOMS TO REPORT TO CLINIC OR PRIMARY MD: We will plan for some lab work with your next port flush. Return to clinic in 12 weeks to see MD. Report any problems or concerns to this clinic as needed.     I acknowledge that I have been informed and understand all the instructions given to me and received a copy. I do not have any more questions at this time, but understand that I may call the Specialty Clinic at [facility] at [phone] during business hours should I have any further questions or need assistance in obtaining follow-up care.    __________________________________________  _____________  __________ Signature of Patient or Authorized Representative            Date                   Time    __________________________________________ Nurse's Signature

## 2011-09-09 ENCOUNTER — Ambulatory Visit (HOSPITAL_COMMUNITY): Payer: BC Managed Care – PPO | Admitting: Oncology

## 2011-09-15 ENCOUNTER — Other Ambulatory Visit (HOSPITAL_COMMUNITY): Payer: Self-pay | Admitting: Oncology

## 2011-09-18 ENCOUNTER — Encounter (HOSPITAL_COMMUNITY): Payer: BC Managed Care – PPO | Attending: Oncology

## 2011-09-18 DIAGNOSIS — C50519 Malignant neoplasm of lower-outer quadrant of unspecified female breast: Secondary | ICD-10-CM

## 2011-09-18 DIAGNOSIS — C50919 Malignant neoplasm of unspecified site of unspecified female breast: Secondary | ICD-10-CM | POA: Insufficient documentation

## 2011-09-18 DIAGNOSIS — C773 Secondary and unspecified malignant neoplasm of axilla and upper limb lymph nodes: Secondary | ICD-10-CM

## 2011-09-18 LAB — COMPREHENSIVE METABOLIC PANEL
ALT: 20 U/L (ref 0–35)
AST: 29 U/L (ref 0–37)
Albumin: 3.9 g/dL (ref 3.5–5.2)
Alkaline Phosphatase: 69 U/L (ref 39–117)
BUN: 18 mg/dL (ref 6–23)
CO2: 26 mEq/L (ref 19–32)
Calcium: 9.8 mg/dL (ref 8.4–10.5)
Chloride: 102 mEq/L (ref 96–112)
Creatinine, Ser: 0.66 mg/dL (ref 0.50–1.10)
GFR calc Af Amer: >90 mL/min (ref >90)
GFR calc non Af Amer: >90 mL/min (ref >90)
Glucose, Bld: 159 mg/dL — ABNORMAL HIGH (ref 70–99)
Potassium: 3.6 mEq/L (ref 3.5–5.1)
Sodium: 138 mEq/L (ref 135–145)
Total Bilirubin: 0.3 mg/dL (ref 0.3–1.2)
Total Protein: 6.9 g/dL (ref 6.0–8.3)

## 2011-09-18 LAB — CBC
HCT: 40.9 % (ref 36.0–46.0)
Hemoglobin: 13.9 g/dL (ref 12.0–15.0)
MCH: 33.2 pg (ref 26.0–34.0)
MCHC: 34 g/dL (ref 30.0–36.0)
MCV: 97.6 fL (ref 78.0–100.0)
Platelets: 215 10*3/uL (ref 150–400)
RBC: 4.19 MIL/uL (ref 3.87–5.11)
RDW: 13.5 % (ref 11.5–15.5)
WBC: 7.7 10*3/uL (ref 4.0–10.5)

## 2011-09-18 MED ORDER — SODIUM CHLORIDE 0.9 % IJ SOLN
10.0000 mL | INTRAMUSCULAR | Status: DC | PRN
  Administered 2011-09-18: 10 mL via INTRAVENOUS
  Filled 2011-09-18: qty 10

## 2011-09-18 MED ORDER — HEPARIN SOD (PORK) LOCK FLUSH 100 UNIT/ML IV SOLN
500.0000 [IU] | Freq: Once | INTRAVENOUS | Status: AC
  Administered 2011-09-18: 500 [IU] via INTRAVENOUS
  Filled 2011-09-18: qty 5

## 2011-09-18 NOTE — Progress Notes (Signed)
Adeline C Edmister presented for Portacath access and flush. Proper placement of portacath confirmed by CXR. Portacath located rt chest wall accessed with  H 20 needle. Good blood return present. Portacath flushed with 20ml NS and 500U/5ml Heparin and needle removed intact. Procedure without incident. Patient tolerated procedure well.   

## 2011-10-30 ENCOUNTER — Encounter (HOSPITAL_COMMUNITY): Payer: BC Managed Care – PPO | Attending: Oncology

## 2011-10-30 DIAGNOSIS — C50519 Malignant neoplasm of lower-outer quadrant of unspecified female breast: Secondary | ICD-10-CM

## 2011-10-30 DIAGNOSIS — C50919 Malignant neoplasm of unspecified site of unspecified female breast: Secondary | ICD-10-CM | POA: Insufficient documentation

## 2011-10-30 DIAGNOSIS — C773 Secondary and unspecified malignant neoplasm of axilla and upper limb lymph nodes: Secondary | ICD-10-CM

## 2011-10-30 DIAGNOSIS — Z452 Encounter for adjustment and management of vascular access device: Secondary | ICD-10-CM

## 2011-10-30 MED ORDER — HEPARIN SOD (PORK) LOCK FLUSH 100 UNIT/ML IV SOLN
500.0000 [IU] | Freq: Once | INTRAVENOUS | Status: AC
  Administered 2011-10-30: 500 [IU] via INTRAVENOUS
  Filled 2011-10-30: qty 5

## 2011-10-30 MED ORDER — SODIUM CHLORIDE 0.9 % IJ SOLN
10.0000 mL | INTRAMUSCULAR | Status: DC | PRN
  Administered 2011-10-30: 10 mL via INTRAVENOUS
  Filled 2011-10-30: qty 10

## 2011-10-30 NOTE — Progress Notes (Signed)
[name] presented for Portacath access and flush. Proper placement of portacath confirmed by CXR. Portacath located right chest wall accessed with  H 20 needle. Good blood return present. Portacath flushed with 20ml NS and 500U/5ml Heparin and needle removed intact. Procedure without incident. Patient tolerated procedure well.   

## 2011-11-09 ENCOUNTER — Encounter (HOSPITAL_COMMUNITY): Payer: Self-pay | Admitting: Oncology

## 2011-11-09 ENCOUNTER — Other Ambulatory Visit (HOSPITAL_COMMUNITY): Payer: Self-pay | Admitting: Oncology

## 2011-12-01 ENCOUNTER — Encounter (HOSPITAL_COMMUNITY): Payer: Self-pay | Admitting: Oncology

## 2011-12-01 ENCOUNTER — Encounter (HOSPITAL_COMMUNITY): Payer: BC Managed Care – PPO | Attending: Oncology | Admitting: Oncology

## 2011-12-01 VITALS — BP 137/70 | HR 81 | Temp 99.3°F | Ht 65.0 in | Wt 135.1 lb

## 2011-12-01 DIAGNOSIS — R5381 Other malaise: Secondary | ICD-10-CM

## 2011-12-01 DIAGNOSIS — C770 Secondary and unspecified malignant neoplasm of lymph nodes of head, face and neck: Secondary | ICD-10-CM

## 2011-12-01 DIAGNOSIS — C50519 Malignant neoplasm of lower-outer quadrant of unspecified female breast: Secondary | ICD-10-CM

## 2011-12-01 DIAGNOSIS — M949 Disorder of cartilage, unspecified: Secondary | ICD-10-CM

## 2011-12-01 DIAGNOSIS — M899 Disorder of bone, unspecified: Secondary | ICD-10-CM

## 2011-12-01 DIAGNOSIS — R5383 Other fatigue: Secondary | ICD-10-CM

## 2011-12-01 DIAGNOSIS — C50919 Malignant neoplasm of unspecified site of unspecified female breast: Secondary | ICD-10-CM | POA: Insufficient documentation

## 2011-12-01 NOTE — Progress Notes (Signed)
This office note has been dictated.

## 2011-12-01 NOTE — Progress Notes (Signed)
CC:   [name], M.D. Juanetta Gosling, MD Lurline Hare, M.D. Freddy Finner, M.D.  DIAGNOSES: 1. Recurrent left-sided adenocarcinoma of the breast to left     supraclavicular lymph node fossa, biopsy-proven lymph node     recurrent disease, estrogen receptor positive still, progesterone     receptor positive, HER-2/neu negative.  Estrogen receptors were     100%, progesterone receptors 14% ,HER-2/neu was nonamplified with a     biopsy on 03/30/2011.  I placed her on tamoxifen at that time since     she was essentially a failure on the Femara. 2. Diagnosed with stage IIIC adenocarcinoma of the left breast when     she presented with clinically positive axillary and supraclavicular     nodal disease, estrogen receptor positive 100%, progesterone     receptor positive 5% , HER-2 was negative by IHC but positive by     CISH over-amplification.  She was treated with dose-dense     epirubicin and Cytoxan and followed by Taxotere, each for 4 cycles,     and Herceptin for 52 weeks starting with the Taxotere.  At the time     of her surgery, she had a 3 mm area of invasive tumor.  All other     areas were smaller.  Eight of 8 nodes, however, were still     involved, and that surgery took place on 08/30/2008.  She went on     to have postoperative radiation therapy to the chest wall,     supraclavicular and axillary nodal areas and then started on Femara     after the radiation. 3. Herpes zoster of the right 4th dermatome in the past. 4. Chronic fatigue, though that is still better but still an issue     about 1 day every 2 weeks. 5. Osteopenia/osteoporosis, on therapy. 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  She is not aware of anything new or different.  On exam, the supraclavicular fossa on the left still has this thickened area below the scar near this pulsating vessel ,of course, and  feels about 3 to 4 mm across.  I think it is scar tissue rather than a recurrent node.  On her heart exam, she still has a grade 1/6 systolic ejection murmur.  No other nodes are palpable anywhere.  No hepatosplenomegaly.  Lungs:  Clear to auscultate auscultation and percussion.  The right breast is negative.  The left chest wall is clear.  There is no edema of her arms or legs.  Her vital signs are very stable and her labs which were done in February really were quite good as well.  So she is going to continue the tamoxifen.  I will see her in July after her trip with her 2 sisters.  We will get blood work.  She will then be due for her Zometa for her bones.  She will continue the tamoxifen.  I will see her back right after her labs and we will put off any PET scans unless we absolutely have to because of cost her.  I think we have enough to follow, anyway.   ______________________________ Ladona Horns. Mariel Sleet, MD ESN/MEDQ  D:  12/01/2011  T:  12/01/2011  Job:  [id number]

## 2011-12-01 NOTE — Patient Instructions (Signed)
[name]  [id number] 04-09-1951 Dr. Glenford Peers   [facility]  Discharge Instructions  RECOMMENDATIONS MADE BY THE CONSULTANT AND ANY TEST RESULTS WILL BE SENT TO YOUR REFERRING DOCTOR.   EXAM FINDINGS BY MD TODAY AND SIGNS AND SYMPTOMS TO REPORT TO CLINIC OR PRIMARY MD: You are doing well.  Report any new lumps, bone pain or shortness of breath.  MEDICATIONS PRESCRIBED: none     SPECIAL INSTRUCTIONS/FOLLOW-UP: Lab work Needed in July and Return to Clinic after labs in July to see MD.   I acknowledge that I have been informed and understand all the instructions given to me and received a copy. I do not have any more questions at this time, but understand that I may call the Specialty Clinic at [facility] at [phone] during business hours should I have any further questions or need assistance in obtaining follow-up care.    __________________________________________  _____________  __________ Signature of Patient or Authorized Representative            Date                   Time    __________________________________________ Nurse's Signature

## 2011-12-09 ENCOUNTER — Other Ambulatory Visit (HOSPITAL_COMMUNITY): Payer: Self-pay | Admitting: Oncology

## 2011-12-11 ENCOUNTER — Encounter (HOSPITAL_COMMUNITY): Payer: BC Managed Care – PPO | Attending: Oncology

## 2011-12-11 DIAGNOSIS — C773 Secondary and unspecified malignant neoplasm of axilla and upper limb lymph nodes: Secondary | ICD-10-CM

## 2011-12-11 DIAGNOSIS — Z452 Encounter for adjustment and management of vascular access device: Secondary | ICD-10-CM

## 2011-12-11 DIAGNOSIS — C50519 Malignant neoplasm of lower-outer quadrant of unspecified female breast: Secondary | ICD-10-CM

## 2011-12-11 DIAGNOSIS — C50919 Malignant neoplasm of unspecified site of unspecified female breast: Secondary | ICD-10-CM | POA: Insufficient documentation

## 2011-12-11 MED ORDER — HEPARIN SOD (PORK) LOCK FLUSH 100 UNIT/ML IV SOLN
INTRAVENOUS | Status: AC
  Filled 2011-12-11: qty 5

## 2011-12-11 MED ORDER — HEPARIN SOD (PORK) LOCK FLUSH 100 UNIT/ML IV SOLN
500.0000 [IU] | Freq: Once | INTRAVENOUS | Status: AC
  Administered 2011-12-11: 500 [IU] via INTRAVENOUS
  Filled 2011-12-11: qty 5

## 2011-12-11 MED ORDER — SODIUM CHLORIDE 0.9 % IJ SOLN
INTRAMUSCULAR | Status: AC
  Filled 2011-12-11: qty 10

## 2011-12-11 MED ORDER — SODIUM CHLORIDE 0.9 % IJ SOLN
10.0000 mL | Freq: Once | INTRAMUSCULAR | Status: AC
  Administered 2011-12-11: 10 mL via INTRAVENOUS
  Filled 2011-12-11: qty 10

## 2011-12-11 NOTE — Progress Notes (Signed)
[name] presented for Portacath access and flush. Proper placement of portacath confirmed by CXR. Portacath located right chest wall accessed with  H 20 needle. Good blood return present. Portacath flushed with 20ml NS and 500U/5ml Heparin and needle removed intact. Procedure without incident. Patient tolerated procedure well.   

## 2012-01-11 ENCOUNTER — Other Ambulatory Visit (HOSPITAL_COMMUNITY): Payer: Self-pay | Admitting: Oncology

## 2012-01-22 ENCOUNTER — Encounter (HOSPITAL_COMMUNITY): Payer: BC Managed Care – PPO | Attending: Oncology

## 2012-01-22 DIAGNOSIS — C773 Secondary and unspecified malignant neoplasm of axilla and upper limb lymph nodes: Secondary | ICD-10-CM

## 2012-01-22 DIAGNOSIS — C50519 Malignant neoplasm of lower-outer quadrant of unspecified female breast: Secondary | ICD-10-CM

## 2012-01-22 DIAGNOSIS — C50919 Malignant neoplasm of unspecified site of unspecified female breast: Secondary | ICD-10-CM | POA: Insufficient documentation

## 2012-01-22 DIAGNOSIS — Z452 Encounter for adjustment and management of vascular access device: Secondary | ICD-10-CM

## 2012-01-22 MED ORDER — SODIUM CHLORIDE 0.9 % IJ SOLN
INTRAMUSCULAR | Status: AC
  Filled 2012-01-22: qty 10

## 2012-01-22 MED ORDER — SODIUM CHLORIDE 0.9 % IJ SOLN
10.0000 mL | INTRAMUSCULAR | Status: DC | PRN
  Administered 2012-01-22: 10 mL via INTRAVENOUS
  Filled 2012-01-22: qty 10

## 2012-01-22 MED ORDER — HEPARIN SOD (PORK) LOCK FLUSH 100 UNIT/ML IV SOLN
INTRAVENOUS | Status: AC
  Filled 2012-01-22: qty 5

## 2012-01-22 MED ORDER — HEPARIN SOD (PORK) LOCK FLUSH 100 UNIT/ML IV SOLN
500.0000 [IU] | Freq: Once | INTRAVENOUS | Status: AC
  Administered 2012-01-22: 500 [IU] via INTRAVENOUS
  Filled 2012-01-22: qty 5

## 2012-01-22 NOTE — Progress Notes (Signed)
Tolerated port flush well.  Good blood return. 

## 2012-02-11 ENCOUNTER — Other Ambulatory Visit (HOSPITAL_COMMUNITY): Payer: Self-pay | Admitting: Oncology

## 2012-02-17 DIAGNOSIS — J069 Acute upper respiratory infection, unspecified: Secondary | ICD-10-CM

## 2012-02-17 HISTORY — DX: Acute upper respiratory infection, unspecified: J06.9

## 2012-02-18 ENCOUNTER — Other Ambulatory Visit (HOSPITAL_COMMUNITY): Payer: BC Managed Care – PPO

## 2012-02-19 ENCOUNTER — Ambulatory Visit (HOSPITAL_COMMUNITY): Payer: BC Managed Care – PPO

## 2012-02-22 ENCOUNTER — Ambulatory Visit (HOSPITAL_COMMUNITY): Payer: BC Managed Care – PPO | Admitting: Oncology

## 2012-03-04 ENCOUNTER — Encounter (HOSPITAL_COMMUNITY): Payer: Self-pay | Admitting: Oncology

## 2012-03-04 ENCOUNTER — Encounter (HOSPITAL_COMMUNITY): Payer: BC Managed Care – PPO | Attending: Oncology | Admitting: Oncology

## 2012-03-04 ENCOUNTER — Encounter (HOSPITAL_BASED_OUTPATIENT_CLINIC_OR_DEPARTMENT_OTHER): Payer: BC Managed Care – PPO

## 2012-03-04 ENCOUNTER — Encounter (HOSPITAL_COMMUNITY): Payer: BC Managed Care – PPO

## 2012-03-04 VITALS — BP 133/76 | HR 76 | Temp 98.7°F | Wt 140.2 lb

## 2012-03-04 DIAGNOSIS — C50919 Malignant neoplasm of unspecified site of unspecified female breast: Secondary | ICD-10-CM

## 2012-03-04 DIAGNOSIS — C50519 Malignant neoplasm of lower-outer quadrant of unspecified female breast: Secondary | ICD-10-CM

## 2012-03-04 DIAGNOSIS — R635 Abnormal weight gain: Secondary | ICD-10-CM | POA: Insufficient documentation

## 2012-03-04 DIAGNOSIS — C778 Secondary and unspecified malignant neoplasm of lymph nodes of multiple regions: Secondary | ICD-10-CM

## 2012-03-04 DIAGNOSIS — M810 Age-related osteoporosis without current pathological fracture: Secondary | ICD-10-CM

## 2012-03-04 LAB — COMPREHENSIVE METABOLIC PANEL
ALT: 16 U/L (ref 0–35)
AST: 25 U/L (ref 0–37)
Albumin: 3.6 g/dL (ref 3.5–5.2)
Alkaline Phosphatase: 63 U/L (ref 39–117)
BUN: 14 mg/dL (ref 6–23)
CO2: 27 mEq/L (ref 19–32)
Calcium: 9.5 mg/dL (ref 8.4–10.5)
Chloride: 100 mEq/L (ref 96–112)
Creatinine, Ser: 1.06 mg/dL (ref 0.50–1.10)
GFR calc Af Amer: 65 mL/min — ABNORMAL LOW (ref >90)
GFR calc non Af Amer: 56 mL/min — ABNORMAL LOW (ref >90)
Glucose, Bld: 117 mg/dL — ABNORMAL HIGH (ref 70–99)
Potassium: 3.7 mEq/L (ref 3.5–5.1)
Sodium: 136 mEq/L (ref 135–145)
Total Bilirubin: 0.3 mg/dL (ref 0.3–1.2)
Total Protein: 6.5 g/dL (ref 6.0–8.3)

## 2012-03-04 LAB — CBC
HCT: 39 % (ref 36.0–46.0)
Hemoglobin: 13 g/dL (ref 12.0–15.0)
MCH: 33 pg (ref 26.0–34.0)
MCHC: 33.3 g/dL (ref 30.0–36.0)
MCV: 99 fL (ref 78.0–100.0)
Platelets: 196 10*3/uL (ref 150–400)
RBC: 3.94 MIL/uL (ref 3.87–5.11)
RDW: 13.1 % (ref 11.5–15.5)
WBC: 9.2 10*3/uL (ref 4.0–10.5)

## 2012-03-04 LAB — DIFFERENTIAL
Basophils Absolute: 0 10*3/uL (ref 0.0–0.1)
Basophils Relative: 0 % (ref 0–1)
Eosinophils Absolute: 0.1 10*3/uL (ref 0.0–0.7)
Eosinophils Relative: 1 % (ref 0–5)
Lymphocytes Relative: 19 % (ref 12–46)
Lymphs Abs: 1.7 10*3/uL (ref 0.7–4.0)
Monocytes Absolute: 0.4 10*3/uL (ref 0.1–1.0)
Monocytes Relative: 4 % (ref 3–12)
Neutro Abs: 7 10*3/uL (ref 1.7–7.7)
Neutrophils Relative %: 76 % (ref 43–77)

## 2012-03-04 MED ORDER — ZOLEDRONIC ACID 4 MG/5ML IV CONC
4.0000 mg | Freq: Once | INTRAVENOUS | Status: AC
  Administered 2012-03-04: 4 mg via INTRAVENOUS
  Filled 2012-03-04: qty 5

## 2012-03-04 MED ORDER — SODIUM CHLORIDE 0.9 % IV SOLN
INTRAVENOUS | Status: DC
  Administered 2012-03-04: 14:00:00 via INTRAVENOUS

## 2012-03-04 MED ORDER — HEPARIN SOD (PORK) LOCK FLUSH 100 UNIT/ML IV SOLN
500.0000 [IU] | Freq: Once | INTRAVENOUS | Status: AC
  Administered 2012-03-04: 500 [IU] via INTRAVENOUS
  Filled 2012-03-04: qty 5

## 2012-03-04 MED ORDER — SODIUM CHLORIDE 0.9 % IJ SOLN
10.0000 mL | INTRAMUSCULAR | Status: DC | PRN
  Administered 2012-03-04: 10 mL via INTRAVENOUS
  Filled 2012-03-04: qty 10

## 2012-03-04 MED ORDER — SODIUM CHLORIDE 0.9 % IJ SOLN
INTRAMUSCULAR | Status: AC
  Filled 2012-03-04: qty 10

## 2012-03-04 NOTE — Progress Notes (Signed)
Tolerated zometa infusion well. 

## 2012-03-04 NOTE — Progress Notes (Signed)
Labs drawn today for cbc/diff,cmp 

## 2012-03-04 NOTE — Progress Notes (Signed)
Recurrent breast cancer to left subclavicular lymph node fossa a biopsy proven lymph node recurrence estrogen receptor positive progesterone receptor positive at 100% and 14% respectively HER-2/neu not amplified with biopsy on 03/30/2011 placed on tamoxifen at that time since she had failed letrozole. Her recurrence as mentioned was not HER-2/neu amplified  Problem #2 stage III C. adenocarcinoma left breast presenting with clinically positive axillary and supraclavicular lymph nodes estrogen receptor +100% progesterone separate +5% HER-2/neu was negative by IHC but positive by CISH she was treated initially with dose dense epirubicin and Cytoxan followed by docetaxel each for 4 cycles and Herceptin for 52 weeks starting with her docetaxel therapy. At the time of her surgery she had a 3 mm area of invasive disease 8 of 8 lymph nodes however still positive with surgery on 08/30/2008. She went on to have postoperative radiation therapy to the chest wall, supraclavicular, and axillary lymph node areas and then she was placed on letrozole.  Problem #3 herpes zoster the right fourth dermatome in the past  Problem #4 osteopenia/osteoporosis on therapy  Problem #5 of persistent fatigue and about a 5 pound weight gain so we will check her TSH level  Ahsley had a vacation in June which she states was fabulous. She had a great time. Her oncologic review of systems once again is negative. Her physical exam shows stable vital signs except her weight is up a little bit. Her lymph nodes reveal a 4-5 mm area just below the scar in the left supraclavicular fossa which does not necessarily feel different and is certainly not new. She has no axillary nodes no infraclavicular nodes no cervical nodes no inguinal nodes no hepatosplenomegaly lungs are clear to auscultation and percussion heart shows a regular rhythm and rate without murmur rub or gallop at this time. She has had a murmur in the past but did not hear Wednesday.  She has no edema of the legs but she still has edema the left arm. She did not wear her sleeve on her vacation during the plane trip.  As long as she is stable physically and her labs are stable which they are we will continue the tamoxifen and see her back in 3 more months sooner if need be.

## 2012-03-04 NOTE — Patient Instructions (Addendum)
[facility]  Discharge Instructions [name]  [id number] 1951-08-02 Dr. Glenford Peers  RECOMMENDATIONS MADE BY THE CONSULTANT AND ANY TEST RESULTS WILL BE SENT TO YOUR REFERRING DOCTOR.   EXAM FINDINGS BY MD TODAY AND SIGNS AND SYMPTOMS TO REPORT TO CLINIC OR PRIMARY MD:  Exam per Dr. Mariel Sleet  MEDICATIONS PRESCRIBED: continue tamoxifen   INSTRUCTIONS GIVEN AND DISCUSSED: TSH added to labs for today  SPECIAL INSTRUCTIONS/FOLLOW-UP: 3 months labs and Dr. Visit.   I acknowledge that I have been informed and understand all the instructions given to me and received a copy. I do not have any more questions at this time, but understand that I may call the Specialty Clinic at [facility] at [phone] during business hours should I have any further questions or need assistance in obtaining follow-up care.    __________________________________________  _____________  __________ Signature of Patient or Authorized Representative            Date                   Time    __________________________________________ Nurse's Signature

## 2012-03-05 LAB — TSH: TSH: 1.03 u[IU]/mL (ref 0.350–4.500)

## 2012-03-12 ENCOUNTER — Other Ambulatory Visit (HOSPITAL_COMMUNITY): Payer: Self-pay | Admitting: Oncology

## 2012-03-12 DIAGNOSIS — F329 Major depressive disorder, single episode, unspecified: Secondary | ICD-10-CM

## 2012-03-12 DIAGNOSIS — F32A Depression, unspecified: Secondary | ICD-10-CM

## 2012-04-10 DIAGNOSIS — S42309A Unspecified fracture of shaft of humerus, unspecified arm, initial encounter for closed fracture: Secondary | ICD-10-CM

## 2012-04-10 HISTORY — DX: Unspecified fracture of shaft of humerus, unspecified arm, initial encounter for closed fracture: S42.309A

## 2012-04-15 ENCOUNTER — Encounter (HOSPITAL_COMMUNITY): Payer: BC Managed Care – PPO | Attending: Oncology

## 2012-04-15 DIAGNOSIS — Z452 Encounter for adjustment and management of vascular access device: Secondary | ICD-10-CM

## 2012-04-15 DIAGNOSIS — C50919 Malignant neoplasm of unspecified site of unspecified female breast: Secondary | ICD-10-CM | POA: Insufficient documentation

## 2012-04-15 DIAGNOSIS — C50519 Malignant neoplasm of lower-outer quadrant of unspecified female breast: Secondary | ICD-10-CM

## 2012-04-15 MED ORDER — HEPARIN SOD (PORK) LOCK FLUSH 100 UNIT/ML IV SOLN
INTRAVENOUS | Status: AC
  Filled 2012-04-15: qty 5

## 2012-04-15 MED ORDER — HEPARIN SOD (PORK) LOCK FLUSH 100 UNIT/ML IV SOLN
500.0000 [IU] | Freq: Once | INTRAVENOUS | Status: AC
  Administered 2012-04-15: 500 [IU] via INTRAVENOUS
  Filled 2012-04-15: qty 5

## 2012-04-15 MED ORDER — SODIUM CHLORIDE 0.9 % IJ SOLN
INTRAMUSCULAR | Status: AC
  Filled 2012-04-15: qty 10

## 2012-04-15 MED ORDER — SODIUM CHLORIDE 0.9 % IJ SOLN
10.0000 mL | INTRAMUSCULAR | Status: DC | PRN
  Administered 2012-04-15: 10 mL via INTRAVENOUS
  Filled 2012-04-15: qty 10

## 2012-04-15 NOTE — Progress Notes (Signed)
[name] presented for Portacath access and flush. Proper placement of portacath confirmed by CXR. Portacath located right chest wall accessed with  H 20 needle. Good blood return present. Portacath flushed with 20ml NS and 500U/5ml Heparin and needle removed intact. Procedure without incident. Patient tolerated procedure well.   

## 2012-04-28 ENCOUNTER — Emergency Department (HOSPITAL_COMMUNITY)
Admission: EM | Admit: 2012-04-28 | Discharge: 2012-04-28 | Disposition: A | Discharge status: Home / Self Care | Payer: BC Managed Care – PPO | Attending: Emergency Medicine | Admitting: Emergency Medicine

## 2012-04-28 ENCOUNTER — Emergency Department (HOSPITAL_COMMUNITY): Payer: BC Managed Care – PPO

## 2012-04-28 ENCOUNTER — Encounter (HOSPITAL_COMMUNITY): Payer: Self-pay | Admitting: *Deleted

## 2012-04-28 DIAGNOSIS — S42209A Unspecified fracture of upper end of unspecified humerus, initial encounter for closed fracture: Secondary | ICD-10-CM | POA: Insufficient documentation

## 2012-04-28 DIAGNOSIS — Z79899 Other long term (current) drug therapy: Secondary | ICD-10-CM | POA: Insufficient documentation

## 2012-04-28 HISTORY — DX: Postmastectomy lymphedema syndrome: I97.2

## 2012-04-28 MED ORDER — HYDROCODONE-ACETAMINOPHEN 5-325 MG PO TABS: 2.0000 | ORAL_TABLET | ORAL | Status: DC | PRN

## 2012-04-28 MED ORDER — HYDROMORPHONE HCL PF 1 MG/ML IJ SOLN
1.0000 mg | Freq: Once | INTRAMUSCULAR | Status: AC
  Administered 2012-04-28: 1 mg via INTRAVENOUS
  Filled 2012-04-28: qty 1

## 2012-04-28 MED ORDER — HYDROCODONE-ACETAMINOPHEN 5-325 MG PO TABS
2.0000 | ORAL_TABLET | Freq: Once | ORAL | Status: AC
  Administered 2012-04-28: 2 via ORAL
  Filled 2012-04-28: qty 2

## 2012-04-28 MED ORDER — FENTANYL CITRATE 0.05 MG/ML IJ SOLN
50.0000 ug | Freq: Once | INTRAMUSCULAR | Status: AC
  Administered 2012-04-28: 50 ug via INTRAVENOUS
  Filled 2012-04-28: qty 2

## 2012-04-28 MED ORDER — OXYCODONE-ACETAMINOPHEN 5-325 MG PO TABS: 2.0000 | ORAL_TABLET | ORAL | Status: DC | PRN

## 2012-04-28 MED ORDER — HYDROCODONE-ACETAMINOPHEN 5-325 MG PO TABS: 2.0000 | ORAL_TABLET | Freq: Four times a day (QID) | ORAL | Status: DC | PRN

## 2012-04-28 MED ORDER — OXYCODONE-ACETAMINOPHEN 5-325 MG PO TABS: 2.0000 | ORAL_TABLET | Freq: Once | ORAL | Status: DC

## 2012-04-28 MED ORDER — HEPARIN SOD (PORK) LOCK FLUSH 100 UNIT/ML IV SOLN
INTRAVENOUS | Status: AC
  Administered 2012-04-28: 21:00:00
  Filled 2012-04-28: qty 5

## 2012-04-28 MED ORDER — ONDANSETRON HCL 4 MG/2ML IJ SOLN
4.0000 mg | Freq: Once | INTRAMUSCULAR | Status: AC
  Administered 2012-04-28: 4 mg via INTRAVENOUS
  Filled 2012-04-28: qty 2

## 2012-04-28 NOTE — ED Notes (Signed)
MD at bedside. 

## 2012-04-28 NOTE — ED Notes (Signed)
Pt alert & oriented x4, stable gait. Patient given discharge instructions, paperwork & prescription(s). Patient  instructed to stop at the registration desk to finish any additional paperwork. Patient verbalized understanding. Pt left department w/ no further questions. 

## 2012-04-28 NOTE — ED Notes (Addendum)
Fell off horse 45 min ago, contusions, abrasions to rt forearm.   Pt had on helmet, No HI, no neck  Pain,  Had nosebleed pta.  Ambulatory into the ER, alert, talking.  No leg pain,  Lg contusion to rt elbow, most pain at  Humerus., midshaft.

## 2012-04-28 NOTE — ED Provider Notes (Signed)
History     CSN: [id number]  Arrival date & time 04/28/12  1735   First MD Initiated Contact with Patient 04/28/12 1750      Chief Complaint  Patient presents with  . Fall    (Consider location/radiation/quality/duration/timing/severity/associated sxs/prior treatment) HPI Pt reports she was riding a horse today when she was thrown off. She was wearing a helmet and a padded vest. Did not hit her head or lose consciousness. She is complaining of severe aching pain in R shoulder worse with movement. She denies any neck or back pain. She had a brief self-limited nose bleed immediately after but no head or face pain now. She has history of breast cancer, s/p L mastecomy.   Past Medical History  Diagnosis Date  . Osteoporosis 01/27/2011  . Borderline hypertension   . Pneumonia   . Bladder infection   . Fatigue   . Generalized headaches   . Wears glasses   . Swollen lymph nodes   . Retinal detachment   . Reflux   . URI (upper respiratory infection) 02/17/12  . 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 Breast cancer   . Kidney infection   . Post-mastectomy lymphedema syndrome     Past Surgical History  Procedure Date  . Cataract extraction   . Cervical disc fusioni   . Retinal detachment and repair   . Btl   . Mastectomy modified radical   . Port-a-cath removal     in place (not removed)  . Biopsy of lymph node     super clavicle  . Breast surgery     Family History  Problem Relation Age of Onset  . Hypertension Mother   . Heart disease Mother     atrial fib  . Heart disease Father   . Other Father     glaucoma    History  Substance Use Topics  . Smoking status: Never Smoker   . Smokeless tobacco: Never Used  . Alcohol Use: 4.2 oz/week    7 Glasses of wine per week    OB History    Grav Para Term Preterm Abortions TAB SAB Ect Mult Living                  Review of Systems All other systems reviewed and are negative except as  noted in HPI.   Allergies  Ciprofloxacin  Home Medications   Current Outpatient Rx  Name Route Sig Dispense Refill  . CALCIUM 1200 PO Oral Take by mouth daily.      Marland Kitchen VITAMIN D PO Oral Take 1,000 Units by mouth daily.     Marland Kitchen DIPHENHYDRAMINE HCL (SLEEP) 25 MG PO TABS Oral Take 25 mg by mouth at bedtime as needed. To help with sleeping    . ESCITALOPRAM OXALATE 10 MG PO TABS      . ESCITALOPRAM OXALATE 10 MG PO TABS  TAKE 2 TABLETS BY MOUTH EVERY DAY 60 tablet 2  . OMEGA-3 FATTY ACIDS 1000 MG PO CAPS Oral Take 1 g by mouth daily.      Marland Kitchen FOLIC ACID 400 MCG PO TABS Oral Take 400 mcg by mouth daily.      . IBUPROFEN 200 MG PO TABS Oral Take 200 mg by mouth every 6 (six) hours as needed.      Marland Kitchen TAMOXIFEN CITRATE 20 MG PO TABS  TAKE 1 TABLET BY MOUTH EVERY DAY 30 tablet 5    BP 136/122  Pulse  91  Temp 98.4 F (36.9 C) (Oral)  Ht 5' 4.75" (1.645 m)  Wt 140 lb (63.504 kg)  BMI 23.48 kg/m2  SpO2 99%  Physical Exam  Nursing note and vitals reviewed. Constitutional: She is oriented to person, place, and time. She appears well-developed and well-nourished.  HENT:  Head: Normocephalic.       Abrasion to upper lip, contusion of lower lip, no periorbital swelling or tenderness, no swelling or tenderness over the nasal bones. Small amount of dried blood in nares, no active bleeding or septal hematoma  Eyes: EOM are normal. Pupils are equal, round, and reactive to light.  Neck: Normal range of motion. Neck supple.  Cardiovascular: Normal rate, normal heart sounds and intact distal pulses.   Pulmonary/Chest: Effort normal and breath sounds normal.  Abdominal: Bowel sounds are normal. She exhibits no distension. There is no tenderness.  Musculoskeletal: She exhibits tenderness.       Large contusion to R elbow, tender to palpation, she also has tenderness to upper humerus, no deformity to shoulder; unable to ROM due to pain  Neurological: She is alert and oriented to person, place, and time. She  has normal strength. No cranial nerve deficit or sensory deficit.  Skin: Skin is warm and dry. No rash noted.  Psychiatric: She has a normal mood and affect.    ED Course  Procedures (including critical care time)  Labs Reviewed - No data to display Dg Elbow Complete Right  04/28/2012  *RADIOLOGY REPORT*  Clinical Data: Larey Seat off horse, pain  RIGHT ELBOW - COMPLETE 3+ VIEW  Comparison: None.  Findings: Because of the humeral fracture, it was difficult to position the patient's elbow for a true lateral.  There is no visible elbow fracture or dislocation.  No visible joint effusion. If pain persists, recommend repeat imaging as clinically indicated.  IMPRESSION: Difficult to position the patient for true lateral.  No visible elbow fracture however.   Original Report Authenticated By: Elsie Stain, M.D.    Dg Humerus Right  04/28/2012  *RADIOLOGY REPORT*  Clinical Data: Larey Seat off horse, pain  RIGHT HUMERUS - 2+ VIEW  Comparison: None.  Findings: There is a slightly comminuted and mildly displaced fracture across the surgical neck of the humerus.  Soft tissue swelling is present.  There is no glenohumeral dislocation.  IMPRESSION: Positive for humeral neck fracture without glenohumeral dislocation.   Original Report Authenticated By: Elsie Stain, M.D.      No diagnosis found.    MDM  Imaging as above shows proximal humerus fx. Will place in sling, pain medications at home and ortho followup. Pt has had some relief with pain medications in the ED. No concern for significant intracranial or cervical spine injury. Ambulating without difficulty in the ED.         Iesha Summerhill B. Bernette Mayers, MD 04/28/12 1944

## 2012-05-02 MED FILL — Hydrocodone-Acetaminophen Tab 5-325 MG: ORAL | Qty: 6 | Status: AC

## 2012-05-11 ENCOUNTER — Encounter (HOSPITAL_COMMUNITY): Payer: Self-pay | Admitting: Oncology

## 2012-05-11 ENCOUNTER — Other Ambulatory Visit (HOSPITAL_COMMUNITY): Payer: Self-pay | Admitting: Oncology

## 2012-05-13 ENCOUNTER — Encounter (HOSPITAL_COMMUNITY): Payer: BC Managed Care – PPO | Attending: Oncology

## 2012-05-13 DIAGNOSIS — C50519 Malignant neoplasm of lower-outer quadrant of unspecified female breast: Secondary | ICD-10-CM

## 2012-05-13 DIAGNOSIS — Z452 Encounter for adjustment and management of vascular access device: Secondary | ICD-10-CM

## 2012-05-13 DIAGNOSIS — C50919 Malignant neoplasm of unspecified site of unspecified female breast: Secondary | ICD-10-CM | POA: Insufficient documentation

## 2012-05-13 LAB — COMPREHENSIVE METABOLIC PANEL
ALT: 14 U/L (ref 0–35)
AST: 21 U/L (ref 0–37)
Albumin: 3.4 g/dL — ABNORMAL LOW (ref 3.5–5.2)
Alkaline Phosphatase: 92 U/L (ref 39–117)
BUN: 16 mg/dL (ref 6–23)
CO2: 24 mEq/L (ref 19–32)
Calcium: 9.2 mg/dL (ref 8.4–10.5)
Chloride: 102 mEq/L (ref 96–112)
Creatinine, Ser: 0.83 mg/dL (ref 0.50–1.10)
GFR calc Af Amer: 88 mL/min — ABNORMAL LOW (ref >90)
GFR calc non Af Amer: 76 mL/min — ABNORMAL LOW (ref >90)
Glucose, Bld: 162 mg/dL — ABNORMAL HIGH (ref 70–99)
Potassium: 3.7 mEq/L (ref 3.5–5.1)
Sodium: 137 mEq/L (ref 135–145)
Total Bilirubin: 0.3 mg/dL (ref 0.3–1.2)
Total Protein: 6.5 g/dL (ref 6.0–8.3)

## 2012-05-13 LAB — CBC WITH DIFFERENTIAL/PLATELET
Basophils Absolute: 0 10*3/uL (ref 0.0–0.1)
Basophils Relative: 0 % (ref 0–1)
Eosinophils Absolute: 0.1 10*3/uL (ref 0.0–0.7)
Eosinophils Relative: 1 % (ref 0–5)
HCT: 36 % (ref 36.0–46.0)
Hemoglobin: 12 g/dL (ref 12.0–15.0)
Lymphocytes Relative: 18 % (ref 12–46)
Lymphs Abs: 1.7 10*3/uL (ref 0.7–4.0)
MCH: 32.8 pg (ref 26.0–34.0)
MCHC: 33.3 g/dL (ref 30.0–36.0)
MCV: 98.4 fL (ref 78.0–100.0)
Monocytes Absolute: 0.4 10*3/uL (ref 0.1–1.0)
Monocytes Relative: 4 % (ref 3–12)
Neutro Abs: 7.2 10*3/uL (ref 1.7–7.7)
Neutrophils Relative %: 77 % (ref 43–77)
Platelets: 300 10*3/uL (ref 150–400)
RBC: 3.66 MIL/uL — ABNORMAL LOW (ref 3.87–5.11)
RDW: 13.4 % (ref 11.5–15.5)
WBC: 9.4 10*3/uL (ref 4.0–10.5)

## 2012-05-13 MED ORDER — SODIUM CHLORIDE 0.9 % IJ SOLN
INTRAMUSCULAR | Status: AC
  Filled 2012-05-13: qty 10

## 2012-05-13 MED ORDER — HEPARIN SOD (PORK) LOCK FLUSH 100 UNIT/ML IV SOLN
INTRAVENOUS | Status: AC
  Filled 2012-05-13: qty 5

## 2012-05-13 MED ORDER — HEPARIN SOD (PORK) LOCK FLUSH 100 UNIT/ML IV SOLN
500.0000 [IU] | Freq: Once | INTRAVENOUS | Status: AC
  Administered 2012-05-13: 500 [IU] via INTRAVENOUS
  Filled 2012-05-13: qty 5

## 2012-05-13 MED ORDER — SODIUM CHLORIDE 0.9 % IJ SOLN
10.0000 mL | INTRAMUSCULAR | Status: DC | PRN
  Administered 2012-05-13: 10 mL via INTRAVENOUS
  Filled 2012-05-13: qty 10

## 2012-05-13 NOTE — Progress Notes (Signed)
Tolerated port flush and labs well. 

## 2012-05-16 ENCOUNTER — Encounter (HOSPITAL_BASED_OUTPATIENT_CLINIC_OR_DEPARTMENT_OTHER): Payer: BC Managed Care – PPO | Admitting: Oncology

## 2012-05-16 VITALS — BP 208/133 | HR 86 | Temp 98.5°F | Resp 18 | Wt 136.0 lb

## 2012-05-16 DIAGNOSIS — C779 Secondary and unspecified malignant neoplasm of lymph node, unspecified: Secondary | ICD-10-CM

## 2012-05-16 DIAGNOSIS — C50519 Malignant neoplasm of lower-outer quadrant of unspecified female breast: Secondary | ICD-10-CM

## 2012-05-16 DIAGNOSIS — B029 Zoster without complications: Secondary | ICD-10-CM

## 2012-05-16 DIAGNOSIS — C50919 Malignant neoplasm of unspecified site of unspecified female breast: Secondary | ICD-10-CM

## 2012-05-16 DIAGNOSIS — M899 Disorder of bone, unspecified: Secondary | ICD-10-CM

## 2012-05-16 NOTE — Patient Instructions (Addendum)
[facility] Specialty Clinic  Discharge Instructions  RECOMMENDATIONS MADE BY THE CONSULTANT AND ANY TEST RESULTS WILL BE SENT TO YOUR REFERRING DOCTOR.   EXAM FINDINGS BY MD TODAY AND SIGNS AND SYMPTOMS TO REPORT TO CLINIC OR PRIMARY MD: Exam and discussion by MD.  Report any new lumps, bone pain or shortness of breath.  MEDICATIONS PRESCRIBED: none    SPECIAL INSTRUCTIONS/FOLLOW-UP: Return to Clinic in 3 months to see MD.   I acknowledge that I have been informed and understand all the instructions given to me and received a copy. I do not have any more questions at this time, but understand that I may call the Specialty Clinic at [facility] at [phone] during business hours should I have any further questions or need assistance in obtaining follow-up care.    __________________________________________  _____________  __________ Signature of Patient or Authorized Representative            Date                   Time    __________________________________________ Nurse's Signature

## 2012-05-16 NOTE — Progress Notes (Signed)
Problem #1 recurrent breast cancer to a left supraclavicular lymph node which was biopsy-proven ER Septra +100% PR receptor +14% HER-2/neu not amplified and her biopsy took place on 03/30/2011. She has been on tamoxifen since then because she had failed letrozole at that time.  Problem #2 stage III C. adenocarcinoma left breast presenting with clinically positive axillary and supraclavicular lymph nodes ER +100% PR +5% HER-2/neu nonamplified by IHC but positive by CIS H. and treated initially with dose dense epirubicin and Cytoxan followed by docetaxel and Herceptin. The epirubicin and Cytoxan were for 4 cycles each followed by docetaxel for 4 cycles and Herceptin for 52 weeks starting with the docetaxel therapy. She was then placed on letrozole after postoperative and postchemotherapy radiation therapy to the chest wall, subclavicular, and actually lymph node areas. Problem #3 herpes zoster the right fourth dermatome in the past  problem #4 osteopenia Problem #5 right upper arm fracture after being thrown from a horse 2 weeks ago. Except for the trauma to her right arm she is doing well. She still can feel this lymph node in the left subclavicular fossa which is 4-5 mm across is not different. There no new lymph nodes in any area. Abdomen is soft and nontender without organomegaly. Lungs are clear. Heart shows a regular rhythm and rate without murmur or gallop. Chest wall the left is clear with only minimal post radiation therapy changes of some telangiectasias. She has no leg edema no left arm swelling of the minimally puffy at best and the right arm is swollen due to the trauma which she states is much better. Her labs are stable and therefore she'll continue the tamoxifen we'll see her back in 3 months after lab work.

## 2012-05-25 ENCOUNTER — Other Ambulatory Visit (HOSPITAL_COMMUNITY): Payer: BC Managed Care – PPO

## 2012-05-27 ENCOUNTER — Ambulatory Visit (HOSPITAL_COMMUNITY): Payer: BC Managed Care – PPO | Admitting: Oncology

## 2012-06-24 ENCOUNTER — Encounter (HOSPITAL_COMMUNITY): Payer: BC Managed Care – PPO | Attending: Oncology

## 2012-06-24 DIAGNOSIS — Z95828 Presence of other vascular implants and grafts: Secondary | ICD-10-CM

## 2012-06-24 DIAGNOSIS — Z452 Encounter for adjustment and management of vascular access device: Secondary | ICD-10-CM

## 2012-06-24 DIAGNOSIS — C50919 Malignant neoplasm of unspecified site of unspecified female breast: Secondary | ICD-10-CM | POA: Insufficient documentation

## 2012-06-24 DIAGNOSIS — C50519 Malignant neoplasm of lower-outer quadrant of unspecified female breast: Secondary | ICD-10-CM

## 2012-06-24 MED ORDER — SODIUM CHLORIDE 0.9 % IJ SOLN
10.0000 mL | INTRAMUSCULAR | Status: DC | PRN
  Administered 2012-06-24: 10 mL via INTRAVENOUS
  Filled 2012-06-24: qty 10

## 2012-06-24 MED ORDER — SODIUM CHLORIDE 0.9 % IJ SOLN
INTRAMUSCULAR | Status: AC
  Filled 2012-06-24: qty 10

## 2012-06-24 MED ORDER — HEPARIN SOD (PORK) LOCK FLUSH 100 UNIT/ML IV SOLN
500.0000 [IU] | Freq: Once | INTRAVENOUS | Status: AC
  Administered 2012-06-24: 500 [IU] via INTRAVENOUS
  Filled 2012-06-24: qty 5

## 2012-06-24 MED ORDER — HEPARIN SOD (PORK) LOCK FLUSH 100 UNIT/ML IV SOLN
INTRAVENOUS | Status: AC
  Filled 2012-06-24: qty 5

## 2012-06-24 NOTE — Progress Notes (Signed)
[name] presented for Portacath access and flush. Proper placement of portacath confirmed by CXR. Portacath located right chest wall accessed with  H 20 needle. Good blood return present. Portacath flushed with 20ml NS and 500U/5ml Heparin and needle removed intact. Procedure without incident. Patient tolerated procedure well.   

## 2012-07-11 ENCOUNTER — Other Ambulatory Visit (HOSPITAL_COMMUNITY): Payer: Self-pay | Admitting: Oncology

## 2012-08-05 ENCOUNTER — Encounter (HOSPITAL_COMMUNITY): Payer: BC Managed Care – PPO | Attending: Oncology

## 2012-08-05 ENCOUNTER — Other Ambulatory Visit (HOSPITAL_COMMUNITY): Payer: Self-pay | Admitting: Oncology

## 2012-08-05 DIAGNOSIS — C778 Secondary and unspecified malignant neoplasm of lymph nodes of multiple regions: Secondary | ICD-10-CM

## 2012-08-05 DIAGNOSIS — C50919 Malignant neoplasm of unspecified site of unspecified female breast: Secondary | ICD-10-CM | POA: Insufficient documentation

## 2012-08-05 DIAGNOSIS — C50519 Malignant neoplasm of lower-outer quadrant of unspecified female breast: Secondary | ICD-10-CM

## 2012-08-05 DIAGNOSIS — Z452 Encounter for adjustment and management of vascular access device: Secondary | ICD-10-CM

## 2012-08-05 LAB — CBC WITH DIFFERENTIAL/PLATELET
Basophils Absolute: 0 10*3/uL (ref 0.0–0.1)
Basophils Relative: 0 % (ref 0–1)
Eosinophils Absolute: 0.1 10*3/uL (ref 0.0–0.7)
Eosinophils Relative: 1 % (ref 0–5)
HCT: 39.3 % (ref 36.0–46.0)
Hemoglobin: 13 g/dL (ref 12.0–15.0)
Lymphocytes Relative: 23 % (ref 12–46)
Lymphs Abs: 2.3 10*3/uL (ref 0.7–4.0)
MCH: 32.4 pg (ref 26.0–34.0)
MCHC: 33.1 g/dL (ref 30.0–36.0)
MCV: 98 fL (ref 78.0–100.0)
Monocytes Absolute: 0.6 10*3/uL (ref 0.1–1.0)
Monocytes Relative: 6 % (ref 3–12)
Neutro Abs: 6.9 10*3/uL (ref 1.7–7.7)
Neutrophils Relative %: 69 % (ref 43–77)
Platelets: 230 10*3/uL (ref 150–400)
RBC: 4.01 MIL/uL (ref 3.87–5.11)
RDW: 13.7 % (ref 11.5–15.5)
WBC: 9.9 10*3/uL (ref 4.0–10.5)

## 2012-08-05 LAB — COMPREHENSIVE METABOLIC PANEL
ALT: 20 U/L (ref 0–35)
AST: 22 U/L (ref 0–37)
Albumin: 3.5 g/dL (ref 3.5–5.2)
Alkaline Phosphatase: 71 U/L (ref 39–117)
BUN: 19 mg/dL (ref 6–23)
CO2: 27 mEq/L (ref 19–32)
Calcium: 9.1 mg/dL (ref 8.4–10.5)
Chloride: 104 mEq/L (ref 96–112)
Creatinine, Ser: 0.74 mg/dL (ref 0.50–1.10)
GFR calc Af Amer: >90 mL/min (ref >90)
GFR calc non Af Amer: >90 mL/min (ref >90)
Glucose, Bld: 95 mg/dL (ref 70–99)
Potassium: 3.9 mEq/L (ref 3.5–5.1)
Sodium: 138 mEq/L (ref 135–145)
Total Bilirubin: 0.1 mg/dL — ABNORMAL LOW (ref 0.3–1.2)
Total Protein: 6.4 g/dL (ref 6.0–8.3)

## 2012-08-05 MED ORDER — HEPARIN SOD (PORK) LOCK FLUSH 100 UNIT/ML IV SOLN
INTRAVENOUS | Status: AC
  Filled 2012-08-05: qty 5

## 2012-08-05 MED ORDER — HEPARIN SOD (PORK) LOCK FLUSH 100 UNIT/ML IV SOLN
500.0000 [IU] | Freq: Once | INTRAVENOUS | Status: AC
  Administered 2012-08-05: 500 [IU] via INTRAVENOUS
  Filled 2012-08-05: qty 5

## 2012-08-05 MED ORDER — SODIUM CHLORIDE 0.9 % IJ SOLN
INTRAMUSCULAR | Status: AC
  Filled 2012-08-05: qty 20

## 2012-08-05 MED ORDER — SODIUM CHLORIDE 0.9 % IJ SOLN
20.0000 mL | INTRAMUSCULAR | Status: DC | PRN
  Administered 2012-08-05: 20 mL via INTRAVENOUS
  Filled 2012-08-05: qty 20

## 2012-08-05 NOTE — Progress Notes (Signed)
[name] presented for Portacath access and flush. Proper placement of portacath confirmed by CXR. Portacath located right chest wall accessed with  H 20 needle. Good blood return present. Portacath flushed with 20ml NS and 500U/88ml Heparin and needle removed intact. Procedure without incident. Patient tolerated procedure well.

## 2012-08-16 ENCOUNTER — Encounter (HOSPITAL_COMMUNITY): Payer: BC Managed Care – PPO | Attending: Oncology | Admitting: Oncology

## 2012-08-16 ENCOUNTER — Encounter (HOSPITAL_COMMUNITY): Payer: Self-pay | Admitting: Oncology

## 2012-08-16 VITALS — BP 134/82 | HR 60 | Temp 98.2°F | Resp 20 | Wt 143.6 lb

## 2012-08-16 DIAGNOSIS — Z8781 Personal history of (healed) traumatic fracture: Secondary | ICD-10-CM

## 2012-08-16 DIAGNOSIS — C778 Secondary and unspecified malignant neoplasm of lymph nodes of multiple regions: Secondary | ICD-10-CM

## 2012-08-16 DIAGNOSIS — C50919 Malignant neoplasm of unspecified site of unspecified female breast: Secondary | ICD-10-CM

## 2012-08-16 DIAGNOSIS — C50519 Malignant neoplasm of lower-outer quadrant of unspecified female breast: Secondary | ICD-10-CM

## 2012-08-16 DIAGNOSIS — M899 Disorder of bone, unspecified: Secondary | ICD-10-CM

## 2012-08-16 DIAGNOSIS — M949 Disorder of cartilage, unspecified: Secondary | ICD-10-CM

## 2012-08-16 NOTE — Patient Instructions (Addendum)
[facility] Discharge Instructions  RECOMMENDATIONS MADE BY THE CONSULTANT AND ANY TEST RESULTS WILL BE SENT TO YOUR REFERRING PHYSICIAN.  EXAM FINDINGS BY THE PHYSICIAN TODAY AND SIGNS OR SYMPTOMS TO REPORT TO CLINIC OR PRIMARY PHYSICIAN: exam and discussion by MD.  [name] are doing well.  Will not make any changes.  Will continue tamoxifen.  MEDICATIONS PRESCRIBED:  none  INSTRUCTIONS GIVEN AND DISCUSSED: Report any new lumps, bone pain or shortness of breath.  SPECIAL INSTRUCTIONS/FOLLOW-UP: Lab work in 3 months and then to be seen in follow-up.  Thank you for choosing [name] to provide your oncology and hematology care.  To afford each patient quality time with our providers, please arrive at least 15 minutes before your scheduled appointment time.  With your help, our goal is to use those 15 minutes to complete the necessary work-up to ensure our physicians have the information they need to help with your evaluation and healthcare recommendations.    Effective January 1st, 2014, we ask that you re-schedule your appointment with our physicians should you arrive 10 or more minutes late for your appointment.  We strive to give you quality time with our providers, and arriving late affects you and other patients whose appointments are after yours.    Again, thank you for choosing [facility].  Our hope is that these requests will decrease the amount of time that you wait before being seen by our physicians.       _____________________________________________________________  Should you have questions after your visit to [facility], please contact our office at [phone] between the hours of 8:30 a.m. and 5:00 p.m.  Voicemails left after 4:30 p.m. will not be returned until the following business day.  For prescription refill requests, have your pharmacy contact our office with your prescription refill request.

## 2012-08-16 NOTE — Progress Notes (Signed)
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 This biopsy took place on 03/30/2011 and she has been on tamoxifen since then because she failed letrozole at that time. Problem #2 stage III C., adenocarcinoma of the left breast presenting with clinically positive axillary and supraclavicular lymph nodes, ER +100%, PR +5%, HER-2/neu nonamplified by IHC but positive by [facility]. We treated her with dose dense epirubicin and Cytoxan followed by docetaxel and Herceptin the latter for 52 weeks. The docetaxel was for 4 cycles and the epirubicin and Cytoxan were for 4 cycles. She was given post operative/postchemotherapy radiation therapy to the chest wall, supraclavicular, and axillary lymph node areas. We then started her on the left resolved. Problem #3 recent right upper arm fracture from trauma Problem #4 osteopenia Problem #5 herpes zoster of the right fourth chest dermatome She is doing very well. She has healed well from her right arm fracture. She is asymptomatic from an oncology review of systems. She has no lymphadenopathy. Vital signs are stable. She looks great. She still has a 4-5 mm left supraclavicular lymph node that is not new or different. She has no other adenopathy. Her lungs are clear. Heart shows a regular rhythm and rate. She has no S3 gallop. Chest wall the left is clear. The right breast is negative. She does have some telangiectasias of the left chest wall status post radiation. These are not new or different. Abdomen remains soft and nontender without organomegaly or masses. Bowel sounds are normal. She has no leg edema but she does have left arm edema but is wearing a compression sleeve.  We will continue the tamoxifen until she either has symptomatology, physical exam changes, or laboratory abnormalities that prompt [place] to repeat a PET scan. Otherwise we will not perform radiological studies at this time. We  will see her back in 3 months after laboratory work.

## 2012-09-11 ENCOUNTER — Other Ambulatory Visit (HOSPITAL_COMMUNITY): Payer: Self-pay | Admitting: Oncology

## 2012-09-16 ENCOUNTER — Encounter (HOSPITAL_COMMUNITY): Payer: BC Managed Care – PPO | Attending: Oncology

## 2012-09-16 DIAGNOSIS — Z452 Encounter for adjustment and management of vascular access device: Secondary | ICD-10-CM

## 2012-09-16 DIAGNOSIS — C50519 Malignant neoplasm of lower-outer quadrant of unspecified female breast: Secondary | ICD-10-CM

## 2012-09-16 DIAGNOSIS — C50919 Malignant neoplasm of unspecified site of unspecified female breast: Secondary | ICD-10-CM | POA: Insufficient documentation

## 2012-09-16 MED ORDER — SODIUM CHLORIDE 0.9 % IJ SOLN
20.0000 mL | INTRAMUSCULAR | Status: DC | PRN
  Administered 2012-09-16: 20 mL via INTRAVENOUS
  Filled 2012-09-16: qty 20

## 2012-09-16 MED ORDER — HEPARIN SOD (PORK) LOCK FLUSH 100 UNIT/ML IV SOLN
INTRAVENOUS | Status: AC
  Filled 2012-09-16: qty 5

## 2012-09-16 MED ORDER — HEPARIN SOD (PORK) LOCK FLUSH 100 UNIT/ML IV SOLN
500.0000 [IU] | Freq: Once | INTRAVENOUS | Status: AC
  Administered 2012-09-16: 500 [IU] via INTRAVENOUS
  Filled 2012-09-16: qty 5

## 2012-09-16 NOTE — Progress Notes (Signed)
Bryonna C Hoffmaster presented for Portacath access and flush. Proper placement of portacath confirmed by CXR. Portacath located right chest wall accessed with  H 20 needle. Good blood return present. Portacath flushed with 20ml NS and 500U/5ml Heparin and needle removed intact. Procedure without incident. Patient tolerated procedure well.   

## 2012-11-13 ENCOUNTER — Other Ambulatory Visit (HOSPITAL_COMMUNITY): Payer: Self-pay | Admitting: Oncology

## 2012-11-14 ENCOUNTER — Encounter (HOSPITAL_COMMUNITY): Payer: BC Managed Care – PPO | Attending: Oncology

## 2012-11-14 DIAGNOSIS — Z452 Encounter for adjustment and management of vascular access device: Secondary | ICD-10-CM

## 2012-11-14 DIAGNOSIS — C50519 Malignant neoplasm of lower-outer quadrant of unspecified female breast: Secondary | ICD-10-CM

## 2012-11-14 DIAGNOSIS — Z9889 Other specified postprocedural states: Secondary | ICD-10-CM | POA: Insufficient documentation

## 2012-11-14 DIAGNOSIS — Z95828 Presence of other vascular implants and grafts: Secondary | ICD-10-CM

## 2012-11-14 DIAGNOSIS — C50919 Malignant neoplasm of unspecified site of unspecified female breast: Secondary | ICD-10-CM

## 2012-11-14 LAB — CBC WITH DIFFERENTIAL/PLATELET
Basophils Absolute: 0 10*3/uL (ref 0.0–0.1)
Basophils Relative: 1 % (ref 0–1)
Eosinophils Absolute: 0.1 10*3/uL (ref 0.0–0.7)
Eosinophils Relative: 1 % (ref 0–5)
HCT: 38.9 % (ref 36.0–46.0)
Hemoglobin: 13 g/dL (ref 12.0–15.0)
Lymphocytes Relative: 27 % (ref 12–46)
Lymphs Abs: 2.3 10*3/uL (ref 0.7–4.0)
MCH: 33 pg (ref 26.0–34.0)
MCHC: 33.4 g/dL (ref 30.0–36.0)
MCV: 98.7 fL (ref 78.0–100.0)
Monocytes Absolute: 0.5 10*3/uL (ref 0.1–1.0)
Monocytes Relative: 6 % (ref 3–12)
Neutro Abs: 5.5 10*3/uL (ref 1.7–7.7)
Neutrophils Relative %: 66 % (ref 43–77)
Platelets: 201 10*3/uL (ref 150–400)
RBC: 3.94 MIL/uL (ref 3.87–5.11)
RDW: 13.4 % (ref 11.5–15.5)
WBC: 8.4 10*3/uL (ref 4.0–10.5)

## 2012-11-14 LAB — COMPREHENSIVE METABOLIC PANEL
ALT: 17 U/L (ref 0–35)
AST: 24 U/L (ref 0–37)
Albumin: 3.5 g/dL (ref 3.5–5.2)
Alkaline Phosphatase: 63 U/L (ref 39–117)
BUN: 26 mg/dL — ABNORMAL HIGH (ref 6–23)
CO2: 24 mEq/L (ref 19–32)
Calcium: 8.9 mg/dL (ref 8.4–10.5)
Chloride: 103 mEq/L (ref 96–112)
Creatinine, Ser: 0.79 mg/dL (ref 0.50–1.10)
GFR calc Af Amer: >90 mL/min (ref >90)
GFR calc non Af Amer: 89 mL/min — ABNORMAL LOW (ref >90)
Glucose, Bld: 94 mg/dL (ref 70–99)
Potassium: 3.8 mEq/L (ref 3.5–5.1)
Sodium: 138 mEq/L (ref 135–145)
Total Bilirubin: 0.2 mg/dL — ABNORMAL LOW (ref 0.3–1.2)
Total Protein: 6.7 g/dL (ref 6.0–8.3)

## 2012-11-14 MED ORDER — HEPARIN SOD (PORK) LOCK FLUSH 100 UNIT/ML IV SOLN
500.0000 [IU] | Freq: Once | INTRAVENOUS | Status: DC
  Filled 2012-11-14: qty 5

## 2012-11-14 MED ORDER — SODIUM CHLORIDE 0.9 % IJ SOLN
10.0000 mL | INTRAMUSCULAR | Status: DC | PRN
  Filled 2012-11-14: qty 10

## 2012-11-14 MED ORDER — HEPARIN SOD (PORK) LOCK FLUSH 100 UNIT/ML IV SOLN
INTRAVENOUS | Status: AC
  Filled 2012-11-14: qty 5

## 2012-11-14 NOTE — Progress Notes (Signed)
[name] presented for Portacath access and flush. Proper placement of portacath confirmed by CXR. Portacath located rt chest wall accessed with  H 20 needle. Good blood return present. Portacath flushed with 20ml NS and 500U/5ml Heparin and needle removed intact. Procedure without incident. Patient tolerated procedure well.   

## 2012-11-15 ENCOUNTER — Telehealth (HOSPITAL_COMMUNITY): Payer: Self-pay

## 2012-11-15 NOTE — Telephone Encounter (Signed)
Per instructions of Dr. Mariel Sleet, patient instructed to increase fluid intake.  Verbalized understanding of instructions.

## 2012-11-16 ENCOUNTER — Encounter (HOSPITAL_BASED_OUTPATIENT_CLINIC_OR_DEPARTMENT_OTHER): Payer: BC Managed Care – PPO | Admitting: Oncology

## 2012-11-16 VITALS — BP 146/71 | HR 59 | Temp 98.0°F | Resp 14 | Wt 143.8 lb

## 2012-11-16 DIAGNOSIS — Z8781 Personal history of (healed) traumatic fracture: Secondary | ICD-10-CM

## 2012-11-16 DIAGNOSIS — C50912 Malignant neoplasm of unspecified site of left female breast: Secondary | ICD-10-CM

## 2012-11-16 DIAGNOSIS — C50919 Malignant neoplasm of unspecified site of unspecified female breast: Secondary | ICD-10-CM

## 2012-11-16 DIAGNOSIS — C778 Secondary and unspecified malignant neoplasm of lymph nodes of multiple regions: Secondary | ICD-10-CM

## 2012-11-16 DIAGNOSIS — M899 Disorder of bone, unspecified: Secondary | ICD-10-CM

## 2012-11-16 NOTE — Progress Notes (Signed)
Problem #1 recurrent breast cancer to left supraclavicular lymph node, biopsy-proven, ER +100%, PR +40%, HER-2/neu nonamplified. This biopsy took place on 03/30/2011 and she has been on tamoxifen since then because of failure on letrozole at that time.  #2 stage III C. 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 We did treat her with dose dense epirubicin and Cytoxan followed by docetaxel and Herceptin, the latter for 52 weeks. The docetaxel as for 4 cycles, and epirubicin and Cytoxan for 4 cycles. She did receive postoperative/post chemotherapy radiation therapy to the left chest wall, supraclavicular and axillary lymph node areas. We then started her on letrozole. She of course then failed that.  #3 right upper arm fracture from, now healed #4 osteopenia #5 herpes zoster the right fourth chest dermatome which has resolved  She is still working full-time and without oncology positivity on review of systems. Her vital signs are stable she looks great but she is getting over a chest cold and that may account for the mild dehydration seen on her her BUN/creatinine ratio.  He was given an antibiotic for this.  Her vital signs are stable. She has one lymph node palpable in the left subclavicular fossa which is approximately 8-9 mm across that is perhaps slightly larger than it was last time. I think we still need to just watch her at this time. She has no pulmonary complaints presently. She has no other lymph nodes anywhere. She is no hepatosplenomegaly. Lungs are clear. Heart shows a regular rhythm and rate. Left chest wall is clear. Right breast is negative for masses. Port-A-Cath is intact. She has no arm or leg edema.  I'll see her in 12 weeks with repeat blood work, she we'll hydrate yourself better and we will see if any other lymph nodes she'll or if this seems bigger. It  certainly not bigger than 9 mm at this point in time

## 2012-11-16 NOTE — Patient Instructions (Addendum)
[facility] Discharge Instructions  RECOMMENDATIONS MADE BY THE CONSULTANT AND ANY TEST RESULTS WILL BE SENT TO YOUR REFERRING PHYSICIAN.  EXAM FINDINGS BY THE PHYSICIAN TODAY AND SIGNS OR SYMPTOMS TO REPORT TO CLINIC OR PRIMARY PHYSICIAN: Exam and discussion by MD.  Bonita Quin are doing well but labs showed you were a little dehydrated.  MEDICATIONS PRESCRIBED:  none  INSTRUCTIONS GIVEN AND DISCUSSED: Report any new lumps, bone pain, shortness of breath or other symptoms.  SPECIAL INSTRUCTIONS/FOLLOW-UP: Port flushes every 6 weeks, blood work in 12 weeks then to see MD.  Thank you for choosing [name] to provide your oncology and hematology care.  To afford each patient quality time with our providers, please arrive at least 15 minutes before your scheduled appointment time.  With your help, our goal is to use those 15 minutes to complete the necessary work-up to ensure our physicians have the information they need to help with your evaluation and healthcare recommendations.    Effective January 1st, 2014, we ask that you re-schedule your appointment with our physicians should you arrive 10 or more minutes late for your appointment.  We strive to give you quality time with our providers, and arriving late affects you and other patients whose appointments are after yours.    Again, thank you for choosing [facility].  Our hope is that these requests will decrease the amount of time that you wait before being seen by our physicians.       _____________________________________________________________  Should you have questions after your visit to [facility], please contact our office at [phone] between the hours of 8:30 a.m. and 5:00 p.m.  Voicemails left after 4:30 p.m. will not be returned until the following business day.  For prescription refill requests, have your pharmacy contact our office with your prescription refill  request.

## 2012-12-13 ENCOUNTER — Other Ambulatory Visit (HOSPITAL_COMMUNITY): Payer: Self-pay | Admitting: Oncology

## 2012-12-28 ENCOUNTER — Encounter (HOSPITAL_COMMUNITY): Payer: BC Managed Care – PPO | Attending: Oncology

## 2012-12-28 DIAGNOSIS — C50919 Malignant neoplasm of unspecified site of unspecified female breast: Secondary | ICD-10-CM

## 2012-12-28 DIAGNOSIS — Z9889 Other specified postprocedural states: Secondary | ICD-10-CM | POA: Insufficient documentation

## 2012-12-28 DIAGNOSIS — Z452 Encounter for adjustment and management of vascular access device: Secondary | ICD-10-CM

## 2012-12-28 DIAGNOSIS — C778 Secondary and unspecified malignant neoplasm of lymph nodes of multiple regions: Secondary | ICD-10-CM

## 2012-12-28 MED ORDER — HEPARIN SOD (PORK) LOCK FLUSH 100 UNIT/ML IV SOLN
INTRAVENOUS | Status: AC
Start: 2012-12-28 — End: 2012-12-28
  Filled 2012-12-28: qty 5

## 2012-12-28 MED ORDER — HEPARIN SOD (PORK) LOCK FLUSH 100 UNIT/ML IV SOLN
500.0000 [IU] | Freq: Once | INTRAVENOUS | Status: AC
  Administered 2012-12-28: 500 [IU] via INTRAVENOUS
  Filled 2012-12-28: qty 5

## 2012-12-28 MED ORDER — SODIUM CHLORIDE 0.9 % IJ SOLN
10.0000 mL | INTRAMUSCULAR | Status: DC | PRN
  Administered 2012-12-28: 10 mL via INTRAVENOUS
  Filled 2012-12-28: qty 10

## 2012-12-28 NOTE — Progress Notes (Signed)
[name] presented for Portacath access and flush.  Proper placement of portacath confirmed by CXR.  Portacath located right  chest wall accessed with  H 20 needle.  Good blood return present. Portacath flushed with 20ml NS and 500U/5ml Heparin and needle removed intact.  Procedure tolerated well and without incident.   

## 2013-02-08 ENCOUNTER — Encounter (HOSPITAL_COMMUNITY): Payer: BC Managed Care – PPO | Attending: Oncology

## 2013-02-08 ENCOUNTER — Ambulatory Visit (HOSPITAL_COMMUNITY): Payer: BC Managed Care – PPO | Admitting: Oncology

## 2013-02-08 DIAGNOSIS — C50912 Malignant neoplasm of unspecified site of left female breast: Secondary | ICD-10-CM

## 2013-02-08 DIAGNOSIS — C50919 Malignant neoplasm of unspecified site of unspecified female breast: Secondary | ICD-10-CM | POA: Insufficient documentation

## 2013-02-08 DIAGNOSIS — Z452 Encounter for adjustment and management of vascular access device: Secondary | ICD-10-CM

## 2013-02-08 DIAGNOSIS — M810 Age-related osteoporosis without current pathological fracture: Secondary | ICD-10-CM | POA: Insufficient documentation

## 2013-02-08 LAB — CBC WITH DIFFERENTIAL/PLATELET
Basophils Absolute: 0 10*3/uL (ref 0.0–0.1)
Basophils Relative: 1 % (ref 0–1)
Eosinophils Absolute: 0.1 10*3/uL (ref 0.0–0.7)
Eosinophils Relative: 1 % (ref 0–5)
HCT: 38.8 % (ref 36.0–46.0)
Hemoglobin: 12.8 g/dL (ref 12.0–15.0)
Lymphocytes Relative: 23 % (ref 12–46)
Lymphs Abs: 1.8 10*3/uL (ref 0.7–4.0)
MCH: 33 pg (ref 26.0–34.0)
MCHC: 33 g/dL (ref 30.0–36.0)
MCV: 100 fL (ref 78.0–100.0)
Monocytes Absolute: 0.5 10*3/uL (ref 0.1–1.0)
Monocytes Relative: 6 % (ref 3–12)
Neutro Abs: 5.6 10*3/uL (ref 1.7–7.7)
Neutrophils Relative %: 70 % (ref 43–77)
Platelets: 234 10*3/uL (ref 150–400)
RBC: 3.88 MIL/uL (ref 3.87–5.11)
RDW: 14 % (ref 11.5–15.5)
WBC: 8 10*3/uL (ref 4.0–10.5)

## 2013-02-08 LAB — COMPREHENSIVE METABOLIC PANEL
ALT: 18 U/L (ref 0–35)
AST: 24 U/L (ref 0–37)
Albumin: 3.6 g/dL (ref 3.5–5.2)
Alkaline Phosphatase: 64 U/L (ref 39–117)
BUN: 16 mg/dL (ref 6–23)
CO2: 26 mEq/L (ref 19–32)
Calcium: 9.4 mg/dL (ref 8.4–10.5)
Chloride: 103 mEq/L (ref 96–112)
Creatinine, Ser: 0.84 mg/dL (ref 0.50–1.10)
GFR calc Af Amer: 86 mL/min — ABNORMAL LOW (ref >90)
GFR calc non Af Amer: 74 mL/min — ABNORMAL LOW (ref >90)
Glucose, Bld: 101 mg/dL — ABNORMAL HIGH (ref 70–99)
Potassium: 3.5 mEq/L (ref 3.5–5.1)
Sodium: 140 mEq/L (ref 135–145)
Total Bilirubin: 0.2 mg/dL — ABNORMAL LOW (ref 0.3–1.2)
Total Protein: 6.5 g/dL (ref 6.0–8.3)

## 2013-02-08 MED ORDER — HEPARIN SOD (PORK) LOCK FLUSH 100 UNIT/ML IV SOLN
INTRAVENOUS | Status: AC
  Filled 2013-02-08: qty 5

## 2013-02-08 MED ORDER — SODIUM CHLORIDE 0.9 % IJ SOLN
10.0000 mL | INTRAMUSCULAR | Status: DC | PRN
  Administered 2013-02-08: 10 mL via INTRAVENOUS
  Filled 2013-02-08: qty 10

## 2013-02-08 MED ORDER — HEPARIN SOD (PORK) LOCK FLUSH 100 UNIT/ML IV SOLN
500.0000 [IU] | Freq: Once | INTRAVENOUS | Status: AC
  Administered 2013-02-08: 500 [IU] via INTRAVENOUS
  Filled 2013-02-08: qty 5

## 2013-02-08 NOTE — Progress Notes (Signed)
[name] presented for Portacath access and flush. Proper placement of portacath confirmed by CXR. Portacath located right chest wall accessed with  H 20 needle. Good blood return present. Portacath flushed with 20ml NS and 500U/5ml Heparin and needle removed intact. Procedure without incident. Patient tolerated procedure well.   

## 2013-02-14 ENCOUNTER — Encounter (HOSPITAL_BASED_OUTPATIENT_CLINIC_OR_DEPARTMENT_OTHER): Payer: BC Managed Care – PPO

## 2013-02-14 ENCOUNTER — Encounter (HOSPITAL_COMMUNITY): Payer: Self-pay

## 2013-02-14 ENCOUNTER — Other Ambulatory Visit (HOSPITAL_COMMUNITY): Payer: Self-pay | Admitting: Oncology

## 2013-02-14 VITALS — BP 137/72 | HR 66 | Temp 98.0°F | Resp 16 | Wt 143.2 lb

## 2013-02-14 DIAGNOSIS — Z139 Encounter for screening, unspecified: Secondary | ICD-10-CM

## 2013-02-14 DIAGNOSIS — C50912 Malignant neoplasm of unspecified site of left female breast: Secondary | ICD-10-CM

## 2013-02-14 DIAGNOSIS — C50919 Malignant neoplasm of unspecified site of unspecified female breast: Secondary | ICD-10-CM

## 2013-02-14 NOTE — Progress Notes (Signed)
Patient returns to clinic today for routine three-month followup. Her only complaint is of "clammy sweats" particularly when she is working outside. They have been been increasing and now occurring on an almost daily basis over the past several weeks. She states when she recurred previously she had the same symptoms. She otherwise feels well. She continues to work full-time and remain active. She has no neurologic complaints. She denies any chest pain or shortness of breath. She has a good appetite and denies weight loss. She denies any fevers. She has no nausea, vomiting, constipation, or diarrhea. She has no urinary complaints. Patient offers no further specific complaints.  GENERAL: No distress, well nourished.  SKIN:  No rashes or significant lesions noted EYES: Conjunctiva are pink and non-injected  LYMPH: No palpable lymphadenopathy, subcentimeter nodule in left supraclavicular space unchanged in size  BREAST: Right breast and axilla without lumps or masses. Left chest wall without evidence of recurrence.  LUNGS: Clear to auscultation, no crackles or wheezes HEART: Regular rate & rhythm, no murmurs, no gallops, S1 normal and S2 normal  ABDOMEN: Abdomen soft, non-tender, normal bowel sounds. EXTREMITIES: No edema, no skin discoloration or tenderness NEURO: Alert & oriented, no focal motor/sensory deficits.  1. Recurrent breast cancer to left supraclavicular lymph node, biopsy-proven, ER +100%, PR +40%, HER-2/neu nonamplified. This biopsy took place on 03/30/2011 and she has been on tamoxifen since then because of previous failure while on letrozole.  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 Initially treated with dose dense Epirubicin and Cytoxan followed by Docetaxel and Herceptin, the latter for 52 weeks. The docetaxel  was given for 4 cycles, epirubicin and Cytoxan for 4 cycles as well. She did receive postoperative/post chemotherapy radiation therapy to the left chest wall, supraclavicular and axillary lymph node areas. We'll also get a mammogram of her right breast in the next one to 2 weeks. 2. "Clammy sweats":  We discussed the possibility of repeating a PET scan to assess for recurrence, but the patient did not want to pursue imaging at this time. Her last PET scan was approximately 2 years ago.  She has agreed to check a CA 27-29 at her next lab draw. 3. Osteopenia: Patient has a bone mineral density scheduled in the near future.  Continue with previously scheduled yearly Zometa. 4. Followup: Return to clinic in 3 months for routine evaluation.

## 2013-02-14 NOTE — Patient Instructions (Addendum)
[facility] Discharge Instructions  RECOMMENDATIONS MADE BY THE CONSULTANT AND ANY TEST RESULTS WILL BE SENT TO YOUR REFERRING PHYSICIAN.  EXAM FINDINGS BY THE PHYSICIAN TODAY AND SIGNS OR SYMPTOMS TO REPORT TO CLINIC OR PRIMARY PHYSICIAN: Exam and discussion by Dr. Orlie Dakin.  We will check your tumor marker with your next lab draw.  MEDICATIONS PRESCRIBED:  Continue the tamoxifen  INSTRUCTIONS GIVEN AND DISCUSSED: Report any new lumps, bone pain, shortness of breath or other symptoms.  SPECIAL INSTRUCTIONS/FOLLOW-UP: Port flushes every 6 weeks and follow-up in 3 months.  Thank you for choosing [name] to provide your oncology and hematology care.  To afford each patient quality time with our providers, please arrive at least 15 minutes before your scheduled appointment time.  With your help, our goal is to use those 15 minutes to complete the necessary work-up to ensure our physicians have the information they need to help with your evaluation and healthcare recommendations.    Effective January 1st, 2014, we ask that you re-schedule your appointment with our physicians should you arrive 10 or more minutes late for your appointment.  We strive to give you quality time with our providers, and arriving late affects you and other patients whose appointments are after yours.    Again, thank you for choosing [facility].  Our hope is that these requests will decrease the amount of time that you wait before being seen by our physicians.       _____________________________________________________________  Should you have questions after your visit to [facility], please contact our office at [phone] between the hours of 8:30 a.m. and 5:00 p.m.  Voicemails left after 4:30 p.m. will not be returned until the following business day.  For prescription refill requests, have your pharmacy contact our office with your prescription  refill request.

## 2013-03-02 ENCOUNTER — Other Ambulatory Visit (HOSPITAL_COMMUNITY): Payer: BC Managed Care – PPO

## 2013-03-03 ENCOUNTER — Ambulatory Visit (HOSPITAL_COMMUNITY)
Admission: RE | Admit: 2013-03-03 | Discharge: 2013-03-03 | Disposition: A | Discharge status: Home / Self Care | Payer: BC Managed Care – PPO | Source: Ambulatory Visit | Attending: Oncology | Admitting: Oncology

## 2013-03-03 ENCOUNTER — Encounter (HOSPITAL_BASED_OUTPATIENT_CLINIC_OR_DEPARTMENT_OTHER): Payer: BC Managed Care – PPO

## 2013-03-03 VITALS — BP 127/53 | HR 60 | Temp 98.8°F | Resp 16

## 2013-03-03 DIAGNOSIS — Z1231 Encounter for screening mammogram for malignant neoplasm of breast: Secondary | ICD-10-CM | POA: Insufficient documentation

## 2013-03-03 DIAGNOSIS — Z139 Encounter for screening, unspecified: Secondary | ICD-10-CM

## 2013-03-03 DIAGNOSIS — C50912 Malignant neoplasm of unspecified site of left female breast: Secondary | ICD-10-CM

## 2013-03-03 DIAGNOSIS — M810 Age-related osteoporosis without current pathological fracture: Secondary | ICD-10-CM

## 2013-03-03 LAB — CANCER ANTIGEN 27.29: CA 27.29: 22 U/mL (ref 0–39)

## 2013-03-03 MED ORDER — HEPARIN SOD (PORK) LOCK FLUSH 100 UNIT/ML IV SOLN
500.0000 [IU] | Freq: Once | INTRAVENOUS | Status: AC
  Administered 2013-03-03: 500 [IU] via INTRAVENOUS
  Filled 2013-03-03: qty 5

## 2013-03-03 MED ORDER — HEPARIN SOD (PORK) LOCK FLUSH 100 UNIT/ML IV SOLN
INTRAVENOUS | Status: AC
  Filled 2013-03-03: qty 5

## 2013-03-03 MED ORDER — SODIUM CHLORIDE 0.9 % IJ SOLN
10.0000 mL | INTRAMUSCULAR | Status: DC | PRN
  Filled 2013-03-03: qty 10

## 2013-03-03 MED ORDER — ZOLEDRONIC ACID 4 MG/5ML IV CONC
4.0000 mg | Freq: Once | INTRAVENOUS | Status: AC
  Administered 2013-03-03: 4 mg via INTRAVENOUS
  Filled 2013-03-03: qty 5

## 2013-03-03 MED ORDER — SODIUM CHLORIDE 0.9 % IV SOLN
INTRAVENOUS | Status: DC
  Administered 2013-03-03: 13:00:00 via INTRAVENOUS

## 2013-03-22 ENCOUNTER — Encounter (HOSPITAL_COMMUNITY): Payer: BC Managed Care – PPO

## 2013-04-14 ENCOUNTER — Encounter (HOSPITAL_COMMUNITY): Payer: BC Managed Care – PPO | Attending: Oncology

## 2013-04-14 ENCOUNTER — Encounter (HOSPITAL_COMMUNITY): Payer: Self-pay

## 2013-04-14 DIAGNOSIS — Z452 Encounter for adjustment and management of vascular access device: Secondary | ICD-10-CM

## 2013-04-14 DIAGNOSIS — C50919 Malignant neoplasm of unspecified site of unspecified female breast: Secondary | ICD-10-CM

## 2013-04-14 DIAGNOSIS — Z9889 Other specified postprocedural states: Secondary | ICD-10-CM | POA: Insufficient documentation

## 2013-04-14 DIAGNOSIS — Z95828 Presence of other vascular implants and grafts: Secondary | ICD-10-CM | POA: Insufficient documentation

## 2013-04-14 HISTORY — DX: Presence of other vascular implants and grafts: Z95.828

## 2013-04-14 MED ORDER — HEPARIN SOD (PORK) LOCK FLUSH 100 UNIT/ML IV SOLN
500.0000 [IU] | Freq: Once | INTRAVENOUS | Status: AC
  Administered 2013-04-14: 500 [IU] via INTRAVENOUS
  Filled 2013-04-14: qty 5

## 2013-04-14 MED ORDER — SODIUM CHLORIDE 0.9 % IJ SOLN
10.0000 mL | INTRAMUSCULAR | Status: DC | PRN
  Administered 2013-04-14: 10 mL via INTRAVENOUS
  Filled 2013-04-14: qty 10

## 2013-04-14 MED ORDER — HEPARIN SOD (PORK) LOCK FLUSH 100 UNIT/ML IV SOLN
INTRAVENOUS | Status: AC
  Filled 2013-04-14: qty 5

## 2013-04-14 NOTE — Progress Notes (Signed)
Alric Quan presented for Portacath access and flush. Proper placement of portacath confirmed by CXR. Portacath located right chest wall accessed with  H 20 needle. Good blood return present. Portacath flushed with 20ml NS and 500U/54ml Heparin and needle removed intact. Procedure without incident. Patient tolerated procedure well.

## 2013-04-24 ENCOUNTER — Other Ambulatory Visit (HOSPITAL_COMMUNITY): Payer: Self-pay | Admitting: Oncology

## 2013-04-24 ENCOUNTER — Telehealth (HOSPITAL_COMMUNITY): Payer: Self-pay

## 2013-04-24 ENCOUNTER — Ambulatory Visit (HOSPITAL_COMMUNITY)
Admission: RE | Admit: 2013-04-24 | Discharge: 2013-04-24 | Disposition: A | Discharge status: Home / Self Care | Payer: BC Managed Care – PPO | Source: Ambulatory Visit | Attending: Oncology | Admitting: Oncology

## 2013-04-24 DIAGNOSIS — C50912 Malignant neoplasm of unspecified site of left female breast: Secondary | ICD-10-CM

## 2013-04-24 DIAGNOSIS — C50919 Malignant neoplasm of unspecified site of unspecified female breast: Secondary | ICD-10-CM | POA: Insufficient documentation

## 2013-04-24 DIAGNOSIS — M25519 Pain in unspecified shoulder: Secondary | ICD-10-CM | POA: Insufficient documentation

## 2013-04-24 NOTE — Telephone Encounter (Signed)
Call from patient with persistent pain in left shoulder and left clavicle for 2 weeks.  Rates at level 2.  Wants to know what to do.  Discussed with Dellis Anes , PA-C.  Will have patient to come for xrays of left shoulder and clavicle.  Verbalized understanding of instructions.

## 2013-04-28 ENCOUNTER — Telehealth (HOSPITAL_COMMUNITY): Payer: Self-pay

## 2013-04-28 NOTE — Telephone Encounter (Signed)
Call from patient with update on shoulder and clavicular pain.  States "its not as achy but is sore.  If I touch the top of the shoulder it's sometimes tender to touch."  Discussed with Dellis Anes, PA-C.  Patient to call back on Tuesday with update and if pain is worse or no better plans will be do do MRI of shoulder.  Verbalized understanding of instructions.

## 2013-05-03 ENCOUNTER — Other Ambulatory Visit (HOSPITAL_COMMUNITY): Payer: Self-pay | Admitting: Oncology

## 2013-05-03 ENCOUNTER — Telehealth (HOSPITAL_COMMUNITY): Payer: Self-pay

## 2013-05-03 DIAGNOSIS — C50912 Malignant neoplasm of unspecified site of left female breast: Secondary | ICD-10-CM

## 2013-05-03 MED ORDER — ALPRAZOLAM 0.5 MG PO TABS: ORAL_TABLET | ORAL | Status: DC

## 2013-05-03 NOTE — Telephone Encounter (Signed)
Call from patient with update on shoulder and clavicular discomfort.  Discomfort is not any better.  If scans ordered, she will be out of town on Friday.

## 2013-05-03 NOTE — Telephone Encounter (Signed)
Patient notified regarding MRI of shoulder on Monday.  Requests rx for xanax be called to [facility] pharmacy.  States "I'll only need 1 pill to be able to do scan."

## 2013-05-08 ENCOUNTER — Ambulatory Visit (HOSPITAL_COMMUNITY)
Admission: RE | Admit: 2013-05-08 | Discharge: 2013-05-08 | Disposition: A | Discharge status: Home / Self Care | Payer: BC Managed Care – PPO | Source: Ambulatory Visit | Attending: Oncology | Admitting: Oncology

## 2013-05-08 DIAGNOSIS — C50912 Malignant neoplasm of unspecified site of left female breast: Secondary | ICD-10-CM

## 2013-05-08 DIAGNOSIS — M25519 Pain in unspecified shoulder: Secondary | ICD-10-CM | POA: Insufficient documentation

## 2013-05-08 DIAGNOSIS — R937 Abnormal findings on diagnostic imaging of other parts of musculoskeletal system: Secondary | ICD-10-CM | POA: Insufficient documentation

## 2013-05-11 ENCOUNTER — Other Ambulatory Visit (HOSPITAL_COMMUNITY): Payer: Self-pay | Admitting: Oncology

## 2013-05-11 DIAGNOSIS — C50912 Malignant neoplasm of unspecified site of left female breast: Secondary | ICD-10-CM

## 2013-05-11 MED ORDER — TAMOXIFEN CITRATE 20 MG PO TABS: 20.0000 mg | ORAL_TABLET | Freq: Every day | ORAL | Status: DC

## 2013-05-17 ENCOUNTER — Encounter (HOSPITAL_COMMUNITY): Payer: BC Managed Care – PPO | Attending: Oncology

## 2013-05-17 ENCOUNTER — Encounter (HOSPITAL_COMMUNITY): Payer: Self-pay

## 2013-05-17 ENCOUNTER — Ambulatory Visit (HOSPITAL_COMMUNITY): Payer: BC Managed Care – PPO

## 2013-05-17 VITALS — BP 145/82 | HR 81 | Temp 99.2°F | Resp 18 | Wt 143.3 lb

## 2013-05-17 DIAGNOSIS — R599 Enlarged lymph nodes, unspecified: Secondary | ICD-10-CM

## 2013-05-17 DIAGNOSIS — Z170 Estrogen receptor positive status [ER+]: Secondary | ICD-10-CM

## 2013-05-17 DIAGNOSIS — C50912 Malignant neoplasm of unspecified site of left female breast: Secondary | ICD-10-CM

## 2013-05-17 DIAGNOSIS — M899 Disorder of bone, unspecified: Secondary | ICD-10-CM

## 2013-05-17 DIAGNOSIS — C50919 Malignant neoplasm of unspecified site of unspecified female breast: Secondary | ICD-10-CM

## 2013-05-17 DIAGNOSIS — C778 Secondary and unspecified malignant neoplasm of lymph nodes of multiple regions: Secondary | ICD-10-CM

## 2013-05-17 DIAGNOSIS — M19019 Primary osteoarthritis, unspecified shoulder: Secondary | ICD-10-CM

## 2013-05-17 NOTE — Patient Instructions (Signed)
[facility] Discharge Instructions  RECOMMENDATIONS MADE BY THE CONSULTANT AND ANY TEST RESULTS WILL BE SENT TO YOUR REFERRING PHYSICIAN.  Continue Tamoxifen as prescribed. Return to clinic in 3 months. Continue port flush every 6 weeks. Report any issues/concerns to clinic as needed.  Thank you for choosing [name] to provide your oncology and hematology care.  To afford each patient quality time with our providers, please arrive at least 15 minutes before your scheduled appointment time.  With your help, our goal is to use those 15 minutes to complete the necessary work-up to ensure our physicians have the information they need to help with your evaluation and healthcare recommendations.    Effective January 1st, 2014, we ask that you re-schedule your appointment with our physicians should you arrive 10 or more minutes late for your appointment.  We strive to give you quality time with our providers, and arriving late affects you and other patients whose appointments are after yours.    Again, thank you for choosing [facility].  Our hope is that these requests will decrease the amount of time that you wait before being seen by our physicians.       _____________________________________________________________  Should you have questions after your visit to [facility], please contact our office at [phone] between the hours of 8:30 a.m. and 5:00 p.m.  Voicemails left after 4:30 p.m. will not be returned until the following business day.  For prescription refill requests, have your pharmacy contact our office with your prescription refill request.

## 2013-05-17 NOTE — Progress Notes (Signed)
[facility] OFFICE PROGRESS NOTE  [name], Jennifer Malkin, MD  [address] [place] [address]  DIAGNOSIS: Adenocarcinoma of breast, left - Plan: CBC with Differential, Comprehensive metabolic panel, CEA, Cancer antigen 27.29  Enlargement of lymph nodes, supraclavicular due to breast CA, ER+  Chief Complaint  Patient presents with  . Breast Cancer    CURRENT THERAPY: Tamoxifen 20 mg daily  INTERVAL HISTORY: [name] 62 y.o. female returns for followup of stage IV breast cancer with left cervical lymph node recurrence, status post biopsy in 2010, currently taking tamoxifen 20 mg daily having been on letrozole prior to that and prior to the diagnosis of stage IV disease. Primary problem is left shoulder discomfort with some numbness involving the left upper extremity. Recent MRI was negative for rotator cuff abnormality. 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 She has hot flashes but is not concerned in regards to control exam. She denies any vaginal discharge or bleeding but is at risk and has experienced recurrent urinary tract infections. She denies any lower extremity swelling or redness, chest pain, PND, orthopnea, or palpitations appetite is good with no nausea, vomiting, diarrhea, constipation, melena, hematochezia, hematuria, skin rash, headache, or seizures.   MEDICAL HISTORY: Past Medical History  Diagnosis Date  . Osteoporosis 01/27/2011  . Borderline hypertension   . Pneumonia   . Bladder infection   . Fatigue   . Generalized headaches   . Wears glasses   . Swollen lymph nodes   . Retinal detachment   . Reflux   . URI (upper respiratory infection) 02/17/12  . 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 Breast cancer   . Kidney infection   . Post-mastectomy lymphedema syndrome   . Closed fracture of humerus sept 2013  . Port catheter in place 04/14/2013    INTERIM HISTORY: has  Adenocarcinoma of left breast; Osteoporosis; and Port catheter in place on her problem list.   Recurrent breast cancer to left supraclavicular lymph node, biopsy-proven, ER +100%, PR +40%, HER-2/neu nonamplified. This biopsy took place on 03/30/2011 and she has been on tamoxifen since then because of previous failure while on letrozole. 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 Initially treated with dose dense Epirubicin and Cytoxan followed by Docetaxel and Herceptin, the latter for 52 weeks. The docetaxel was given for 4 cycles, epirubicin and Cytoxan for 4 cycles as well. She did receive postoperative/post chemotherapy radiation therapy to the left chest wall, supraclavicular and axillary lymph node areas  ALLERGIES:  is allergic to ciprofloxacin.  MEDICATIONS: has a current medication list which includes the following prescription(s): calcium-vitamin d, diphenhydramine, escitalopram, folic acid, tamoxifen, vitamin c, and nebivolol.  SURGICAL HISTORY:  Past Surgical History  Procedure Laterality Date  . Cataract extraction    . Cervical disc fusioni    . Retinal detachment and repair    . Btl    . Mastectomy modified radical    . Port-a-cath removal      in place (not removed)  . Biopsy of lymph node      super clavicle  . Breast surgery      FAMILY HISTORY: family history includes Heart disease in her father and mother; Hypertension in her mother; Other in her father.  SOCIAL HISTORY:  reports that she has never smoked. She has never used smokeless tobacco. She reports that she drinks about 4.2 ounces of alcohol per week. She reports that she does not use illicit drugs.  REVIEW OF SYSTEMS:  Other than that discussed above is noncontributory.  PHYSICAL EXAMINATION: ECOG PERFORMANCE STATUS: 0 -  Asymptomatic  Blood pressure 145/82, pulse 81, temperature 99.2 F (37.3 C), temperature source Oral, resp. rate 18, weight 143 lb 4.8 oz (65 kg).  GENERAL:alert, no distress and comfortable SKIN: skin color, texture, turgor are normal, no rashes or significant lesions EYES: PERLA; Conjunctiva are pink and non-injected, sclera clear OROPHARYNX:no exudate, no erythema on lips, buccal mucosa, or tongue. NECK: supple, thyroid normal size, non-tender, without nodularity. No masses CHEST: Status post left breast lumpectomy with no masses in either breast. LYMPH:  no palpable lymphadenopathy in the cervical, axillary or inguinal LUNGS: clear to auscultation and percussion with normal breathing effort HEART: regular rate & rhythm and no murmurs and no lower extremity edema. Minimal left upper extremity lymphedema. ABDOMEN:abdomen soft, non-tender and normal bowel sounds MUSCULOSKELETAL:no cyanosis of digits and no clubbing. Range of motion slightly diminished in the left shoulder.  NEURO: alert & oriented x 3 with fluent speech, no focal motor/sensory deficits   LABORATORY DATA: No visits with results within 30 Day(s) from this visit. Latest known visit with results is:  Infusion on 03/03/2013  Component Date Value Range Status  . CA 27.29 03/03/2013 22  0 - 39 U/mL Final    PATHOLOGY:  Urinalysis    Component Value Date/Time   COLORURINE YELLOW 08/06/2008 1500   APPEARANCEUR CLEAR 08/06/2008 1500   LABSPEC <1.005* 08/06/2008 1500   PHURINE 6.5 08/06/2008 1500   GLUCOSEU NEGATIVE 08/06/2008 1500   HGBUR SMALL* 08/06/2008 1500   BILIRUBINUR NEGATIVE 08/06/2008 1500   KETONESUR NEGATIVE 08/06/2008 1500   PROTEINUR NEGATIVE 08/06/2008 1500   UROBILINOGEN 0.2 08/06/2008 1500   NITRITE NEGATIVE 08/06/2008 1500   LEUKOCYTESUR NEGATIVE 08/06/2008 1500    RADIOGRAPHIC STUDIES: Dg Clavicle Left  04/24/2013   *RADIOLOGY REPORT*  Clinical Data: Pain in the left shoulder and  supraclavicular area. History of recurrent breast cancer.  LEFT CLAVICLE - 2+ VIEWS  Comparison: Shoulder radiograph same date  Findings: Left axillary and apical clips again noted.  Cervical fusion hardware partly visualized.  Right-sided vascular catheter partly visualized.  No left clavicular fracture or dislocation is identified.  IMPRESSION: No acute osseous abnormality of the left clavicle or lytic or sclerotic osseous lesion.   Original Report Authenticated By: Christiana Pellant, M.D.   Mr Shoulder Left Wo Contrast  05/08/2013   CLINICAL DATA:  Left shoulder pain. No known injury.  EXAM: MRI OF THE LEFT SHOULDER WITHOUT CONTRAST  TECHNIQUE: Multiplanar, multisequence MR imaging of the shoulder was performed. No intravenous contrast was administered.  COMPARISON:  Left shoulder radiographs 04/24/2013.  FINDINGS: There are surgical clips within the axillary recess consistent with prior axillary node dissection. No axillary adenopathy is identified  Rotator cuff: Intact with mild supraspinatus tendinosis. The subscapularis, infraspinous and teres minor tendons appear normal.  Muscles:  No focal muscular atrophy or edema.  Biceps long head:  Intact and normally positioned.  Acromioclavicular Joint: The acromion is type 1. There are moderate acromioclavicular degenerative changes. A small amount of fluid is present in the subacromial-subdeltoid bursa.  Glenohumeral Joint: No significant shoulder joint effusion or glenohumeral arthropathy.  Labrum:  No evidence of labral tear.  Bones:  No significant extra-articular osseous  findings.  IMPRESSION: 1. Mild supraspinatus tendinosis. No evidence of rotator cuff tear. 2. Moderate acromioclavicular degenerative changes. No acute osseous findings. 3. Intact labrum and biceps tendon.   Electronically Signed   By: Roxy Horseman   On: 05/08/2013 15:13   Dg Shoulder Left  04/24/2013   *RADIOLOGY REPORT*  Clinical Data: Left shoulder pain, history of recurrent breast cancer   LEFT SHOULDER - 2+ VIEW  Comparison: None.  Findings: Left axillary clips are noted.  No fracture or dislocation.  Cervical fusion hardware partly visualized.  No lytic or sclerotic osseous lesion is identified.  Left apex is grossly clear.  IMPRESSION: No acute osseous abnormality of the left shoulder or lytic or sclerotic osseous metastatic disease identified.   Original Report Authenticated By: Christiana Pellant, M.D.    ASSESSMENT: #1.Recurrent breast cancer to left supraclavicular lymph node, biopsy-proven, ER +100%, PR +40%, HER-2/neu nonamplified. This biopsy took place on 03/30/2011 and she has been on tamoxifen since then because of previous failure while on letrozole. 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 Initially treated with dose dense Epirubicin and Cytoxan followed by Docetaxel and Herceptin, the latter for 52 weeks. The docetaxel was given for 4 cycles, epirubicin and Cytoxan for 4 cycles as well. She did receive postoperative/post chemotherapy radiation therapy to the left chest wall, supraclavicular and axillary lymph node areas. #2. Left shoulder degenerative joint disease. #3 osteopenia, on yearly zoledronic acid.    PLAN: 1. Continue tamoxifen 20 mg daily and call should any vaginal spotting occur. #2. Call if desirous of any intervention for vasomotor instability. #3. Followup in 3 months with labs and physical exam. Mammogram in July was normal.   All questions were answered. The patient knows to call the clinic with any problems, questions or concerns. We can certainly see the patient much sooner if necessary.   I spent 30 minutes counseling the patient face to face. The total time spent in the appointment was 25 minutes.    Maurilio Lovely, MD 05/17/2013 1:16 PM

## 2013-05-26 ENCOUNTER — Encounter (HOSPITAL_BASED_OUTPATIENT_CLINIC_OR_DEPARTMENT_OTHER): Payer: BC Managed Care – PPO

## 2013-05-26 DIAGNOSIS — C50912 Malignant neoplasm of unspecified site of left female breast: Secondary | ICD-10-CM

## 2013-05-26 DIAGNOSIS — Z452 Encounter for adjustment and management of vascular access device: Secondary | ICD-10-CM

## 2013-05-26 DIAGNOSIS — C50919 Malignant neoplasm of unspecified site of unspecified female breast: Secondary | ICD-10-CM

## 2013-05-26 LAB — CBC WITH DIFFERENTIAL/PLATELET
Basophils Absolute: 0 10*3/uL (ref 0.0–0.1)
Basophils Relative: 0 % (ref 0–1)
Eosinophils Absolute: 0 10*3/uL (ref 0.0–0.7)
Eosinophils Relative: 0 % (ref 0–5)
HCT: 38 % (ref 36.0–46.0)
Hemoglobin: 12.7 g/dL (ref 12.0–15.0)
Lymphocytes Relative: 25 % (ref 12–46)
Lymphs Abs: 2.2 10*3/uL (ref 0.7–4.0)
MCH: 33.2 pg (ref 26.0–34.0)
MCHC: 33.4 g/dL (ref 30.0–36.0)
MCV: 99.2 fL (ref 78.0–100.0)
Monocytes Absolute: 0.5 10*3/uL (ref 0.1–1.0)
Monocytes Relative: 6 % (ref 3–12)
Neutro Abs: 6.1 10*3/uL (ref 1.7–7.7)
Neutrophils Relative %: 69 % (ref 43–77)
Platelets: 234 10*3/uL (ref 150–400)
RBC: 3.83 MIL/uL — ABNORMAL LOW (ref 3.87–5.11)
RDW: 13.3 % (ref 11.5–15.5)
WBC: 8.9 10*3/uL (ref 4.0–10.5)

## 2013-05-26 LAB — COMPREHENSIVE METABOLIC PANEL
ALT: 23 U/L (ref 0–35)
AST: 26 U/L (ref 0–37)
Albumin: 3.8 g/dL (ref 3.5–5.2)
Alkaline Phosphatase: 54 U/L (ref 39–117)
BUN: 14 mg/dL (ref 6–23)
CO2: 24 mEq/L (ref 19–32)
Calcium: 8.9 mg/dL (ref 8.4–10.5)
Chloride: 102 mEq/L (ref 96–112)
Creatinine, Ser: 0.86 mg/dL (ref 0.50–1.10)
GFR calc Af Amer: 83 mL/min — ABNORMAL LOW (ref >90)
GFR calc non Af Amer: 72 mL/min — ABNORMAL LOW (ref >90)
Glucose, Bld: 79 mg/dL (ref 70–99)
Potassium: 3.7 mEq/L (ref 3.5–5.1)
Sodium: 136 mEq/L (ref 135–145)
Total Bilirubin: 0.3 mg/dL (ref 0.3–1.2)
Total Protein: 6.5 g/dL (ref 6.0–8.3)

## 2013-05-26 LAB — CANCER ANTIGEN 27.29: CA 27.29: 22 U/mL (ref 0–39)

## 2013-05-26 LAB — CEA: CEA: <0.5 ng/mL (ref 0.0–5.0)

## 2013-05-26 MED ORDER — HEPARIN SOD (PORK) LOCK FLUSH 100 UNIT/ML IV SOLN
500.0000 [IU] | Freq: Once | INTRAVENOUS | Status: AC
  Administered 2013-05-26: 500 [IU] via INTRAVENOUS

## 2013-05-26 MED ORDER — HEPARIN SOD (PORK) LOCK FLUSH 100 UNIT/ML IV SOLN
INTRAVENOUS | Status: AC
  Filled 2013-05-26: qty 5

## 2013-05-26 MED ORDER — SODIUM CHLORIDE 0.9 % IJ SOLN
10.0000 mL | INTRAMUSCULAR | Status: DC | PRN
  Administered 2013-05-26: 10 mL via INTRAVENOUS

## 2013-05-26 NOTE — Progress Notes (Signed)
[name] presented for Portacath access and flush. Proper placement of portacath confirmed by CXR. Portacath located right chest wall accessed with  H 20 needle. Good blood return present. Portacath flushed with 20ml NS and 500U/46ml Heparin and needle removed intact. Procedure without incident. Patient tolerated procedure well.

## 2013-06-19 ENCOUNTER — Other Ambulatory Visit (HOSPITAL_COMMUNITY): Payer: Self-pay | Admitting: Oncology

## 2013-06-19 DIAGNOSIS — F329 Major depressive disorder, single episode, unspecified: Secondary | ICD-10-CM

## 2013-06-19 DIAGNOSIS — F32A Depression, unspecified: Secondary | ICD-10-CM

## 2013-06-19 MED ORDER — ESCITALOPRAM OXALATE 10 MG PO TABS: 20.0000 mg | ORAL_TABLET | Freq: Every day | ORAL | Status: DC

## 2013-07-17 ENCOUNTER — Encounter (HOSPITAL_COMMUNITY): Payer: BC Managed Care – PPO | Attending: Oncology

## 2013-07-17 DIAGNOSIS — Z452 Encounter for adjustment and management of vascular access device: Secondary | ICD-10-CM

## 2013-07-17 DIAGNOSIS — Z95828 Presence of other vascular implants and grafts: Secondary | ICD-10-CM

## 2013-07-17 DIAGNOSIS — Z9889 Other specified postprocedural states: Secondary | ICD-10-CM | POA: Insufficient documentation

## 2013-07-17 DIAGNOSIS — C50919 Malignant neoplasm of unspecified site of unspecified female breast: Secondary | ICD-10-CM

## 2013-07-17 MED ORDER — SODIUM CHLORIDE 0.9 % IJ SOLN
10.0000 mL | INTRAMUSCULAR | Status: DC | PRN
  Administered 2013-07-17: 10 mL via INTRAVENOUS

## 2013-07-17 MED ORDER — HEPARIN SOD (PORK) LOCK FLUSH 100 UNIT/ML IV SOLN
500.0000 [IU] | Freq: Once | INTRAVENOUS | Status: AC
  Administered 2013-07-17: 500 [IU] via INTRAVENOUS
  Filled 2013-07-17: qty 5

## 2013-07-17 NOTE — Progress Notes (Signed)
[name] presented for Portacath access and flush.. Portacath located right chest wall accessed with  H 20 needle. Good blood return present. Portacath flushed with 20ml NS and 500U/46ml Heparin and needle removed intact. Procedure without incident. Patient tolerated procedure well.

## 2013-08-15 NOTE — Progress Notes (Signed)
Delphina Cahill, MD  [place] [address]  [name], left - Plan: metoprolol tartrate (LOPRESSOR) 25 MG tablet, CBC with Differential, Comprehensive metabolic panel, Cancer antigen 27.29, CBC with Differential, Comprehensive metabolic panel, Cancer antigen 27.29, CBC with Differential, Comprehensive metabolic panel, Cancer antigen 27.29  CURRENT THERAPY: Tamoxifen 20 mg daily  INTERVAL HISTORY: [name] 63 y.o. female returns for  regular  visit for followup of Recurrent Conway cancer to left supraclavicular lymph node, biopsy-proven, ER +100%, PR +40%, HER-2/neu nonamplified. This biopsy took place on 03/30/2011 and she has been on tamoxifen since then because of previous failure while on letrozole. Patient initially was diagnosed with stage IIIc Jennifer the left Conway with clinically positive axillary and supraclavicular lymph nodes, ER +100%, PR +5%, HER-2/neu nonamplified by IHC, but positive by CIS age. Initially treated with dose dense Epirubicin and Cytoxan followed by Docetaxel and Herceptin, the latter for 52 weeks. The docetaxel was given for 4 cycles, epirubicin and Cytoxan for 4 cycles as well. She did receive postoperative/post chemotherapy radiation therapy to the left chest wall, supraclavicular and axillary lymph node areas.  I personally reviewed and went over laboratory results with the patient.  Her labs are out of date and therefore will be repeated today.  We will get her labs and port flushes on a better schedule and therefore we will do a port flush today.  In light of her recurrent disease, [name] would be a great candidate for life long Tamoxifen if she is accepting of the possible risks of long-term Tamoxifen use including GU malignancy (uterine cancer) and VTE.    She understands that Dr. Tressie Stalker is at [facility] and she is considering switching her care to him, however, she is hesitant due to the [facility] nursing staff and support staff.  "They are like family."  She will consider her options.  Oncologically, she denies any complaints and ROS questioning is negative.   She notes hot flashes secondary to Tamoxifen, but these are manageable and do not interfere with ADLs.  Additionally, she notes post-op neuropathic discomfort of the left Conway.  I provided her education regarding this symptom.   Also, I provided the patient education regarding CA 27.29 markers.  She was educated on the sensitivity, but lack of specificity, of this test.  She likely does not have a good marker as it was not elevated at time of recurrence, but if it were to be elevated in the future, further studies would be required.   Past Medical History  Diagnosis Date  . Osteoporosis 01/27/2011  . Borderline hypertension   . Pneumonia   . Bladder infection   . Fatigue   . Generalized headaches   . Wears glasses   . Swollen lymph nodes   . Retinal detachment   . Reflux   . URI (upper respiratory infection) 02/17/12  . [name] 01/27/2011    stage IIIc Conway cancer  . Conway cancer   . Kidney infection   . Post-mastectomy lymphedema syndrome   . Closed fracture of humerus sept 2013  . Port catheter in place 04/14/2013    has Jennifer of left Conway; Osteoporosis; and Port catheter in place on her problem list.     is allergic to ciprofloxacin.  [name] had no medications administered during this visit.  Past Surgical History  Procedure Laterality Date  . Cataract extraction    . Cervical  disc fusioni    . Retinal detachment and repair    . Btl    . Mastectomy modified radical    . Port-a-cath removal      in place (not removed)  . Biopsy of lymph node      super clavicle  . Conway surgery      Denies any headaches, dizziness, double vision, fevers, chills, night sweats, nausea, vomiting, diarrhea, constipation, chest pain, heart palpitations, shortness of  breath, blood in stool, [place] tarry stool, urinary pain, urinary burning, urinary frequency, hematuria.   PHYSICAL EXAMINATION  ECOG PERFORMANCE STATUS: 0 - Asymptomatic  There were no vitals filed for this visit.  GENERAL:alert, healthy, no distress, well nourished, well developed, comfortable, cooperative and smiling SKIN: skin color, texture, turgor are normal, no rashes or significant lesions HEAD: Normocephalic, No masses, lesions, tenderness or abnormalities EYES: normal, PERRLA, EOMI, Conjunctiva are pink and non-injected EARS: External ears normal OROPHARYNX:mucous membranes are moist  NECK: supple, no adenopathy, thyroid normal size, non-tender, without nodularity, no stridor, non-tender, trachea midline LYMPH:  no palpable lymphadenopathy, no hepatosplenomegaly Conway:right Conway normal without mass, skin or nipple changes or axillary nodes, left post-mastectomy site well healed and free of suspicious changes LUNGS: clear to auscultation and percussion HEART: regular rate & rhythm, no murmurs, no gallops, S1 normal and S2 normal ABDOMEN:abdomen soft, non-tender, normal bowel sounds, no masses or organomegaly and no hepatosplenomegaly BACK: Back symmetric, no curvature., No CVA tenderness EXTREMITIES:less then 2 second capillary refill, no joint deformities, effusion, or inflammation, no edema, no skin discoloration, no clubbing, no cyanosis  NEURO: alert & oriented x 3 with fluent speech, no focal motor/sensory deficits, gait normal    LABORATORY DATA: CBC    Component Value Date/Time   WBC 8.9 05/26/2013 1340   RBC 3.83* 05/26/2013 1340   HGB 12.7 05/26/2013 1340   HCT 38.0 05/26/2013 1340   PLT 234 05/26/2013 1340   MCV 99.2 05/26/2013 1340   MCH 33.2 05/26/2013 1340   MCHC 33.4 05/26/2013 1340   RDW 13.3 05/26/2013 1340   LYMPHSABS 2.2 05/26/2013 1340   MONOABS 0.5 05/26/2013 1340   EOSABS 0.0 05/26/2013 1340   BASOSABS 0.0 05/26/2013 1340      Chemistry       Component Value Date/Time   NA 136 05/26/2013 1340   K 3.7 05/26/2013 1340   CL 102 05/26/2013 1340   CO2 24 05/26/2013 1340   BUN 14 05/26/2013 1340   CREATININE 0.86 05/26/2013 1340      Component Value Date/Time   CALCIUM 8.9 05/26/2013 1340   ALKPHOS 54 05/26/2013 1340   AST 26 05/26/2013 1340   ALT 23 05/26/2013 1340   BILITOT 0.3 05/26/2013 1340     Lab Results  Component Value Date   LABCA2 22 05/26/2013      ASSESSMENT:  1. Recurrent Conway cancer to left supraclavicular lymph node, biopsy-proven, ER +100%, PR +40%, HER-2/neu nonamplified. This biopsy took place on 03/30/2011 and she has been on tamoxifen since then because of previous failure while on letrozole. Patient initially was diagnosed with stage IIIc Jennifer the left Conway with clinically positive axillary and supraclavicular lymph nodes, ER +100%, PR +5%, HER-2/neu nonamplified by IHC, but positive by CIS age. Initially treated with dose dense Epirubicin and Cytoxan followed by Docetaxel and Herceptin, the latter for 52 weeks. The docetaxel was given for 4 cycles, epirubicin and Cytoxan for 4 cycles as well. She did receive postoperative/post chemotherapy radiation therapy to the left  chest wall, supraclavicular and axillary lymph node areas. 2. Left shoulder degenerative joint disease. 3. Osteopenia, on yearly zoledronic acid (Last given on 03/03/2013)  Patient Active Problem List   Diagnosis Date Noted  . Port catheter in place 04/14/2013  . Jennifer of left Conway 01/27/2011  . Osteoporosis 01/27/2011     PLAN:  1. I personally reviewed and went over laboratory results with the patient. 2. I personally reviewed and went over radiographic studies with the patient. 3. Continue with Tamoxifen daily 4. Labs today with port flush: CBC diff, CMET, CA 27.29. 5. Reclast due in July 2015.  Supportive therapy plan reviewed 6. Port flush every 6 weeks from today 7. Labs in 12 weeks: CBC diff, CMET,  CA 27.29 8. Patient education regarding post-op neuropathic pain 9. Patient education regarding CA 27.29 markers. 10. Return in 3 months for follow-up    THERAPY PLAN:  Due to her history of Conway cancer recurrence, Addilyn could benefit from lifelong Tamoxifen as long as she is accepting of the risks of long-term use, including secondary malignancy (Uterine Cancer) and VTE.     All questions were answered. The patient knows to call the clinic with any problems, questions or concerns. We can certainly see the patient much sooner if necessary.  Patient and plan discussed with Dr. Farrel Gobble and he is in agreement with the aforementioned.   Shiv Shuey

## 2013-08-16 ENCOUNTER — Encounter (HOSPITAL_COMMUNITY): Payer: BC Managed Care – PPO | Attending: Oncology | Admitting: Oncology

## 2013-08-16 ENCOUNTER — Encounter (HOSPITAL_COMMUNITY): Payer: Self-pay | Admitting: Oncology

## 2013-08-16 DIAGNOSIS — C50919 Malignant neoplasm of unspecified site of unspecified female breast: Secondary | ICD-10-CM

## 2013-08-16 DIAGNOSIS — C50912 Malignant neoplasm of unspecified site of left female breast: Secondary | ICD-10-CM

## 2013-08-16 DIAGNOSIS — Z170 Estrogen receptor positive status [ER+]: Secondary | ICD-10-CM

## 2013-08-16 DIAGNOSIS — M949 Disorder of cartilage, unspecified: Secondary | ICD-10-CM

## 2013-08-16 DIAGNOSIS — M899 Disorder of bone, unspecified: Secondary | ICD-10-CM

## 2013-08-16 LAB — CBC WITH DIFFERENTIAL/PLATELET
Basophils Absolute: 0 10*3/uL (ref 0.0–0.1)
Basophils Relative: 0 % (ref 0–1)
Eosinophils Absolute: 0.1 10*3/uL (ref 0.0–0.7)
Eosinophils Relative: 1 % (ref 0–5)
HCT: 40.2 % (ref 36.0–46.0)
Hemoglobin: 13.3 g/dL (ref 12.0–15.0)
Lymphocytes Relative: 26 % (ref 12–46)
Lymphs Abs: 2.3 10*3/uL (ref 0.7–4.0)
MCH: 32.8 pg (ref 26.0–34.0)
MCHC: 33.1 g/dL (ref 30.0–36.0)
MCV: 99.3 fL (ref 78.0–100.0)
Monocytes Absolute: 0.7 10*3/uL (ref 0.1–1.0)
Monocytes Relative: 7 % (ref 3–12)
Neutro Abs: 5.9 10*3/uL (ref 1.7–7.7)
Neutrophils Relative %: 66 % (ref 43–77)
Platelets: 269 10*3/uL (ref 150–400)
RBC: 4.05 MIL/uL (ref 3.87–5.11)
RDW: 13.3 % (ref 11.5–15.5)
WBC: 9 10*3/uL (ref 4.0–10.5)

## 2013-08-16 LAB — COMPREHENSIVE METABOLIC PANEL
ALT: 25 U/L (ref 0–35)
AST: 31 U/L (ref 0–37)
Albumin: 3.8 g/dL (ref 3.5–5.2)
Alkaline Phosphatase: 70 U/L (ref 39–117)
BUN: 20 mg/dL (ref 6–23)
CO2: 24 mEq/L (ref 19–32)
Calcium: 9.4 mg/dL (ref 8.4–10.5)
Chloride: 103 mEq/L (ref 96–112)
Creatinine, Ser: 0.78 mg/dL (ref 0.50–1.10)
GFR calc Af Amer: >90 mL/min (ref >90)
GFR calc non Af Amer: 89 mL/min — ABNORMAL LOW (ref >90)
Glucose, Bld: 88 mg/dL (ref 70–99)
Potassium: 4 mEq/L (ref 3.7–5.3)
Sodium: 140 mEq/L (ref 137–147)
Total Bilirubin: <0.2 mg/dL — ABNORMAL LOW (ref 0.3–1.2)
Total Protein: 7.1 g/dL (ref 6.0–8.3)

## 2013-08-16 MED ORDER — HEPARIN SOD (PORK) LOCK FLUSH 100 UNIT/ML IV SOLN
500.0000 [IU] | Freq: Once | INTRAVENOUS | Status: AC
  Administered 2013-08-16: 500 [IU] via INTRAVENOUS

## 2013-08-16 MED ORDER — SODIUM CHLORIDE 0.9 % IJ SOLN
10.0000 mL | INTRAMUSCULAR | Status: DC | PRN
  Administered 2013-08-16: 10 mL via INTRAVENOUS

## 2013-08-16 MED ORDER — HEPARIN SOD (PORK) LOCK FLUSH 100 UNIT/ML IV SOLN
INTRAVENOUS | Status: AC
  Filled 2013-08-16: qty 5

## 2013-08-16 NOTE — Patient Instructions (Signed)
.  [facility] Discharge Instructions  RECOMMENDATIONS MADE BY THE CONSULTANT AND ANY TEST RESULTS WILL BE SENT TO YOUR REFERRING PHYSICIAN.  EXAM FINDINGS BY THE PHYSICIAN TODAY AND SIGNS OR SYMPTOMS TO REPORT TO CLINIC OR PRIMARY PHYSICIAN: Exam and findings as discussed by T. Sheldon Silvan PA. Report any new lumps and bumps.     Labs in 12 weeks INSTRUCTIONS/FOLLOW-UP: 3 months with Dr.  Lujean Rave you for choosing [facility] to provide your oncology and hematology care.  To afford each patient quality time with our providers, please arrive at least 15 minutes before your scheduled appointment time.  With your help, our goal is to use those 15 minutes to complete the necessary work-up to ensure our physicians have the information they need to help with your evaluation and healthcare recommendations.    Effective January 1st, 2014, we ask that you re-schedule your appointment with our physicians should you arrive 10 or more minutes late for your appointment.  We strive to give you quality time with our providers, and arriving late affects you and other patients whose appointments are after yours.    Again, thank you for choosing [facility].  Our hope is that these requests will decrease the amount of time that you wait before being seen by our physicians.       _____________________________________________________________  Should you have questions after your visit to [facility], please contact our office at [phone] between the hours of 8:30 a.m. and 5:00 p.m.  Voicemails left after 4:30 p.m. will not be returned until the following business day.  For prescription refill requests, have your pharmacy contact our office with your prescription refill request.

## 2013-08-16 NOTE — Progress Notes (Signed)
[name] presented for Portacath access and flush. Portacath located rt  chest wall accessed with  H 20 needle. Good blood return present. Portacath flushed with 39ml NS and 500U/1ml Heparin and needle removed intact. Procedure without incident. Patient tolerated procedure well.

## 2013-08-17 LAB — CANCER ANTIGEN 27.29: CA 27.29: 26 U/mL (ref 0–39)

## 2013-08-28 ENCOUNTER — Encounter (HOSPITAL_COMMUNITY): Payer: BC Managed Care – PPO

## 2013-09-27 ENCOUNTER — Encounter (HOSPITAL_COMMUNITY): Payer: BC Managed Care – PPO | Attending: Oncology

## 2013-09-27 DIAGNOSIS — Z452 Encounter for adjustment and management of vascular access device: Secondary | ICD-10-CM

## 2013-09-27 DIAGNOSIS — Z853 Personal history of malignant neoplasm of breast: Secondary | ICD-10-CM | POA: Insufficient documentation

## 2013-09-27 DIAGNOSIS — C50919 Malignant neoplasm of unspecified site of unspecified female breast: Secondary | ICD-10-CM

## 2013-09-27 MED ORDER — SODIUM CHLORIDE 0.9 % IJ SOLN
10.0000 mL | INTRAMUSCULAR | Status: DC | PRN
  Administered 2013-09-27: 10 mL via INTRAVENOUS

## 2013-09-27 MED ORDER — HEPARIN SOD (PORK) LOCK FLUSH 100 UNIT/ML IV SOLN
500.0000 [IU] | Freq: Once | INTRAVENOUS | Status: AC
  Administered 2013-09-27: 500 [IU] via INTRAVENOUS
  Filled 2013-09-27: qty 5

## 2013-09-27 NOTE — Progress Notes (Signed)
Teofilo Pod presented for Portacath access and flush.  Proper placement of portacath confirmed by CXR.  Portacath located right  chest wall accessed with  H 20 needle.  Good blood return present. Portacath flushed with 18ml NS and 500U/47ml Heparin and needle removed intact.  Procedure tolerated well and without incident.

## 2013-11-08 ENCOUNTER — Encounter (HOSPITAL_COMMUNITY): Payer: BC Managed Care – PPO | Attending: Oncology

## 2013-11-08 DIAGNOSIS — Z95828 Presence of other vascular implants and grafts: Secondary | ICD-10-CM

## 2013-11-08 DIAGNOSIS — C50912 Malignant neoplasm of unspecified site of left female breast: Secondary | ICD-10-CM

## 2013-11-08 DIAGNOSIS — Z452 Encounter for adjustment and management of vascular access device: Secondary | ICD-10-CM

## 2013-11-08 DIAGNOSIS — C50919 Malignant neoplasm of unspecified site of unspecified female breast: Secondary | ICD-10-CM

## 2013-11-08 LAB — CBC WITH DIFFERENTIAL/PLATELET
Basophils Absolute: 0 10*3/uL (ref 0.0–0.1)
Basophils Relative: 0 % (ref 0–1)
Eosinophils Absolute: 0 10*3/uL (ref 0.0–0.7)
Eosinophils Relative: 0 % (ref 0–5)
HCT: 38.4 % (ref 36.0–46.0)
Hemoglobin: 12.8 g/dL (ref 12.0–15.0)
Lymphocytes Relative: 21 % (ref 12–46)
Lymphs Abs: 1.6 10*3/uL (ref 0.7–4.0)
MCH: 33 pg (ref 26.0–34.0)
MCHC: 33.3 g/dL (ref 30.0–36.0)
MCV: 99 fL (ref 78.0–100.0)
Monocytes Absolute: 0.4 10*3/uL (ref 0.1–1.0)
Monocytes Relative: 5 % (ref 3–12)
Neutro Abs: 5.5 10*3/uL (ref 1.7–7.7)
Neutrophils Relative %: 74 % (ref 43–77)
Platelets: 224 10*3/uL (ref 150–400)
RBC: 3.88 MIL/uL (ref 3.87–5.11)
RDW: 13.3 % (ref 11.5–15.5)
WBC: 7.5 10*3/uL (ref 4.0–10.5)

## 2013-11-08 LAB — COMPREHENSIVE METABOLIC PANEL
ALT: 23 U/L (ref 0–35)
AST: 32 U/L (ref 0–37)
Albumin: 3.7 g/dL (ref 3.5–5.2)
Alkaline Phosphatase: 64 U/L (ref 39–117)
BUN: 20 mg/dL (ref 6–23)
CO2: 27 mEq/L (ref 19–32)
Calcium: 9.3 mg/dL (ref 8.4–10.5)
Chloride: 101 mEq/L (ref 96–112)
Creatinine, Ser: 0.97 mg/dL (ref 0.50–1.10)
GFR calc Af Amer: 72 mL/min — ABNORMAL LOW (ref >90)
GFR calc non Af Amer: 62 mL/min — ABNORMAL LOW (ref >90)
Glucose, Bld: 151 mg/dL — ABNORMAL HIGH (ref 70–99)
Potassium: 3.8 mEq/L (ref 3.7–5.3)
Sodium: 139 mEq/L (ref 137–147)
Total Bilirubin: 0.3 mg/dL (ref 0.3–1.2)
Total Protein: 6.8 g/dL (ref 6.0–8.3)

## 2013-11-08 MED ORDER — SODIUM CHLORIDE 0.9 % IJ SOLN
10.0000 mL | INTRAMUSCULAR | Status: DC | PRN
  Administered 2013-11-08: 10 mL via INTRAVENOUS

## 2013-11-08 MED ORDER — HEPARIN SOD (PORK) LOCK FLUSH 100 UNIT/ML IV SOLN
INTRAVENOUS | Status: AC
  Filled 2013-11-08: qty 5

## 2013-11-08 MED ORDER — HEPARIN SOD (PORK) LOCK FLUSH 100 UNIT/ML IV SOLN
500.0000 [IU] | Freq: Once | INTRAVENOUS | Status: AC
  Administered 2013-11-08: 500 [IU] via INTRAVENOUS

## 2013-11-08 NOTE — Progress Notes (Signed)
[place] presented for Portacath access and flush. Portacath located right chest wall accessed with  H 20 needle. Good blood return present; labs drawn from site. Portacath flushed with 16ml NS and 500U/95ml Heparin and needle removed intact. Procedure without incident. Patient tolerated procedure well.

## 2013-11-09 ENCOUNTER — Ambulatory Visit (HOSPITAL_COMMUNITY): Payer: BC Managed Care – PPO

## 2013-11-09 LAB — CANCER ANTIGEN 27.29: CA 27.29: 27 U/mL (ref 0–39)

## 2013-11-10 ENCOUNTER — Ambulatory Visit (HOSPITAL_COMMUNITY): Payer: BC Managed Care – PPO

## 2013-11-10 ENCOUNTER — Ambulatory Visit (HOSPITAL_COMMUNITY): Payer: BC Managed Care – PPO | Admitting: Oncology

## 2013-11-14 ENCOUNTER — Encounter (HOSPITAL_BASED_OUTPATIENT_CLINIC_OR_DEPARTMENT_OTHER): Payer: BC Managed Care – PPO

## 2013-11-14 ENCOUNTER — Encounter (HOSPITAL_COMMUNITY): Payer: Self-pay

## 2013-11-14 VITALS — BP 161/80 | HR 65 | Temp 98.7°F | Resp 16 | Wt 144.4 lb

## 2013-11-14 DIAGNOSIS — T148 Other injury of unspecified body region: Secondary | ICD-10-CM

## 2013-11-14 DIAGNOSIS — C50519 Malignant neoplasm of lower-outer quadrant of unspecified female breast: Secondary | ICD-10-CM

## 2013-11-14 DIAGNOSIS — C50919 Malignant neoplasm of unspecified site of unspecified female breast: Secondary | ICD-10-CM

## 2013-11-14 DIAGNOSIS — C770 Secondary and unspecified malignant neoplasm of lymph nodes of head, face and neck: Secondary | ICD-10-CM

## 2013-11-14 DIAGNOSIS — S30860A Insect bite (nonvenomous) of lower back and pelvis, initial encounter: Secondary | ICD-10-CM

## 2013-11-14 DIAGNOSIS — M810 Age-related osteoporosis without current pathological fracture: Secondary | ICD-10-CM

## 2013-11-14 DIAGNOSIS — W57XXXA Bitten or stung by nonvenomous insect and other nonvenomous arthropods, initial encounter: Secondary | ICD-10-CM

## 2013-11-14 MED ORDER — DOXYCYCLINE HYCLATE 100 MG PO TABS: 100.0000 mg | ORAL_TABLET | Freq: Two times a day (BID) | ORAL | Status: DC

## 2013-11-14 NOTE — Patient Instructions (Signed)
[facility] Discharge Instructions  RECOMMENDATIONS MADE BY THE CONSULTANT AND ANY TEST RESULTS WILL BE SENT TO YOUR REFERRING PHYSICIAN.  EXAM FINDINGS BY THE PHYSICIAN TODAY AND SIGNS OR SYMPTOMS TO REPORT TO CLINIC OR PRIMARY PHYSICIAN: Exam and findings as discussed by Dr.Formanek.  MEDICATIONS PRESCRIBED:  Doxycycline 100 mg two times daily x 20 days.  INSTRUCTIONS/FOLLOW-UP: Lyme disease titer blood test today. If test comes back negative you may stop the doxycycline. Return to clinic in 3 months, we will repeat lyme disease titer test then with MD appointment. Report any issues/concerns to clinic as needed prior to appointment.  Thank you for choosing [facility] to provide your oncology and hematology care.  To afford each patient quality time with our providers, please arrive at least 15 minutes before your scheduled appointment time.  With your help, our goal is to use those 15 minutes to complete the necessary work-up to ensure our physicians have the information they need to help with your evaluation and healthcare recommendations.    Effective January 1st, 2014, we ask that you re-schedule your appointment with our physicians should you arrive 10 or more minutes late for your appointment.  We strive to give you quality time with our providers, and arriving late affects you and other patients whose appointments are after yours.    Again, thank you for choosing [facility].  Our hope is that these requests will decrease the amount of time that you wait before being seen by our physicians.       _____________________________________________________________  Should you have questions after your visit to [facility], please contact our office at [phone] between the hours of 8:30 a.m. and 5:00 p.m.  Voicemails left after 4:30 p.m. will not be returned until the following business day.  For prescription refill requests,  have your pharmacy contact our office with your prescription refill request.

## 2013-11-14 NOTE — Progress Notes (Signed)
[place]  OFFICE PROGRESS NOTE  [place], [facility], MD  [place] [address]  DIAGNOSIS: Tick bite of back - Plan: Lyme Disease Ab, Quant, IgM, Lyme Disease Ab, Quant, IgM, Miscellaneous test  Adenocarcinoma of breast  Metastasis to supraclavicular lymph node  Chief Complaint  Patient presents with  . Breast Cancer    CURRENT THERAPY: Tamoxifen 20 mg daily  INTERVAL HISTORY: [name] 63 y.o. female returns for followup of recurrent breast cancer currently taking tamoxifen 20 mg daily since August of 2012 with recurrence occurring while on letrozole in the left supraclavicular area.  4 days ago she was bitten by a tick. There is an area on the upper portion of the back that is indurated with an apparent subcutaneous nodule higher up adjacent to the skull. She denies any fever, night sweats, joint pain, hot flashes, vaginal discharge, chest pain, shortness of breath, PND, orthopnea, lower extremity swelling or redness, cough, wheezing, bone pain, skin rash, headache, or seizures. Self breast examination is unremarkable. She denies any vaginal bleeding or discharge.  MEDICAL HISTORY: Past Medical History  Diagnosis Date  . Osteoporosis 01/27/2011  . Borderline hypertension   . Pneumonia   . Bladder infection   . Fatigue   . Generalized headaches   . Wears glasses   . Swollen lymph nodes   . Retinal detachment   . Reflux   . URI (upper respiratory infection) 02/17/12  . 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 Breast cancer   . Kidney infection   . Post-mastectomy lymphedema syndrome   . Closed fracture of humerus sept 2013  . Port catheter in place 04/14/2013    INTERIM HISTORY: has Adenocarcinoma of left breast; Osteoporosis; and Port catheter in place on her problem list.   Recurrent breast cancer to left supraclavicular lymph node, biopsy-proven, ER +100%, PR +40%, HER-2/neu  nonamplified. This biopsy took place on 03/30/2011 and she has been on tamoxifen since then because of previous failure while on letrozole. 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 Initially treated with dose dense Epirubicin and Cytoxan followed by Docetaxel and Herceptin, the latter for 52 weeks. The docetaxel was given for 4 cycles, epirubicin and Cytoxan for 4 cycles as well. She did receive postoperative/post chemotherapy radiation therapy to the left chest wall, supraclavicular and axillary lymph node areas  ALLERGIES:  is allergic to ciprofloxacin.  MEDICATIONS: has a current medication list which includes the following prescription(s): calcium-vitamin d, diphenhydramine, escitalopram, folic acid, metoprolol tartrate, tamoxifen, doxycycline, and vitamin c.  SURGICAL HISTORY:  Past Surgical History  Procedure Laterality Date  . Cataract extraction    . Cervical disc fusioni    . Retinal detachment and repair    . Btl    . Mastectomy modified radical    . Port-a-cath removal      in place (not removed)  . Biopsy of lymph node      super clavicle  . Breast surgery      FAMILY HISTORY: family history includes Heart disease in her father and mother; Hypertension in her mother; Other in her father.  SOCIAL HISTORY:  reports that she has never smoked. She has never used smokeless tobacco. She reports that she drinks about 4.2 ounces  of alcohol per week. She reports that she does not use illicit drugs.  REVIEW OF SYSTEMS:  Other than that discussed above is noncontributory.  PHYSICAL EXAMINATION: ECOG PERFORMANCE STATUS: 0 - Asymptomatic  Blood pressure 161/80, pulse 65, temperature 98.7 F (37.1 C), temperature source Oral, resp. rate 16, weight 144 lb 6.4 oz (65.499 kg).  GENERAL:alert, no distress and  comfortable SKIN: skin color, texture, turgor are normal, no rashes or significant lesions EYES: PERLA; Conjunctiva are pink and non-injected, sclera clear SINUSES: No redness or tenderness over maxillary or ethmoid sinuses OROPHARYNX:no exudate, no erythema on lips, buccal mucosa, or tongue. NECK: supple, thyroid normal size, non-tender, without nodularity. No masses. Posterior upper back on the left with a 1 cm area of oral induration that is nontender with no accompanying rash. There is a subcutaneous nodule felt adjacent to the left lateral portion of the skull which appears to be a lymph node. CHEST: Status post left mastectomy with low-lying ectatic changes in the skin without subcutaneous nodules. Right breast without mass. LYMPH:  no palpable lymphadenopathy in the cervical, axillary or inguinal LUNGS: clear to auscultation and percussion with normal breathing effort HEART: regular rate & rhythm and no murmurs. ABDOMEN:abdomen soft, non-tender and normal bowel sounds MUSCULOSKELETAL:no cyanosis of digits and no clubbing. Range of motion normal.  NEURO: alert & oriented x 3 with fluent speech, no focal motor/sensory deficits   LABORATORY DATA: Infusion on 11/08/2013  Component Date Value Ref Range Status  . WBC 11/08/2013 7.5  4.0 - 10.5 K/uL Final  . RBC 11/08/2013 3.88  3.87 - 5.11 MIL/uL Final  . Hemoglobin 11/08/2013 12.8  12.0 - 15.0 g/dL Final  . HCT 11/08/2013 38.4  36.0 - 46.0 % Final  . MCV 11/08/2013 99.0  78.0 - 100.0 fL Final  . MCH 11/08/2013 33.0  26.0 - 34.0 pg Final  . MCHC 11/08/2013 33.3  30.0 - 36.0 g/dL Final  . RDW 11/08/2013 13.3  11.5 - 15.5 % Final  . Platelets 11/08/2013 224  150 - 400 K/uL Final  . Neutrophils Relative % 11/08/2013 74  43 - 77 % Final  . Neutro Abs 11/08/2013 5.5  1.7 - 7.7 K/uL Final  . Lymphocytes Relative 11/08/2013 21  12 - 46 % Final  . Lymphs Abs 11/08/2013 1.6  0.7 - 4.0 K/uL Final  . Monocytes Relative 11/08/2013 5  3 - 12 %  Final  . Monocytes Absolute 11/08/2013 0.4  0.1 - 1.0 K/uL Final  . Eosinophils Relative 11/08/2013 0  0 - 5 % Final  . Eosinophils Absolute 11/08/2013 0.0  0.0 - 0.7 K/uL Final  . Basophils Relative 11/08/2013 0  0 - 1 % Final  . Basophils Absolute 11/08/2013 0.0  0.0 - 0.1 K/uL Final  . Sodium 11/08/2013 139  137 - 147 mEq/L Final  . Potassium 11/08/2013 3.8  3.7 - 5.3 mEq/L Final  . Chloride 11/08/2013 101  96 - 112 mEq/L Final  . CO2 11/08/2013 27  19 - 32 mEq/L Final  . Glucose, Bld 11/08/2013 151* 70 - 99 mg/dL Final  . BUN 11/08/2013 20  6 - 23 mg/dL Final  . Creatinine, Ser 11/08/2013 0.97  0.50 - 1.10 mg/dL Final  . Calcium 11/08/2013 9.3  8.4 - 10.5 mg/dL Final  . Total Protein 11/08/2013 6.8  6.0 - 8.3 g/dL Final  . Albumin 11/08/2013 3.7  3.5 - 5.2 g/dL Final  . AST 11/08/2013 32  0 - 37 U/L Final  . ALT  11/08/2013 23  0 - 35 U/L Final  . Alkaline Phosphatase 11/08/2013 64  39 - 117 U/L Final  . Total Bilirubin 11/08/2013 0.3  0.3 - 1.2 mg/dL Final  . GFR calc non Af Amer 11/08/2013 62* >90 mL/min Final  . GFR calc Af Amer 11/08/2013 72* >90 mL/min Final   Comment: (NOTE)                          The eGFR has been calculated using the CKD EPI equation.                          This calculation has not been validated in all clinical situations.                          eGFR's persistently <90 mL/min signify possible Chronic Kidney                          Disease.  Marland Kitchen CA 27.29 11/08/2013 27  0 - 39 U/mL Final   Performed at [place]: No new pathology.  Urinalysis    Component Value Date/Time   COLORURINE YELLOW 08/06/2008 1500   APPEARANCEUR CLEAR 08/06/2008 1500   LABSPEC <1.005* 08/06/2008 1500   PHURINE 6.5 08/06/2008 1500   GLUCOSEU NEGATIVE 08/06/2008 1500   HGBUR SMALL* 08/06/2008 1500   BILIRUBINUR NEGATIVE 08/06/2008 1500   KETONESUR NEGATIVE 08/06/2008 1500   PROTEINUR NEGATIVE 08/06/2008 1500   UROBILINOGEN 0.2 08/06/2008 1500     NITRITE NEGATIVE 08/06/2008 1500   LEUKOCYTESUR NEGATIVE 08/06/2008 1500    RADIOGRAPHIC STUDIES: No results found.  ASSESSMENT:  #1.Recurrent breast cancer to left supraclavicular lymph node, biopsy-proven, ER +100%, PR +40%, HER-2/neu nonamplified. This biopsy took place on 03/30/2011 and she has been on tamoxifen since then because of previous failure while on letrozole. 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 Initially treated with dose dense Epirubicin and Cytoxan followed by Docetaxel and Herceptin, the latter for 52 weeks. The docetaxel was given for 4 cycles, epirubicin and Cytoxan for 4 cycles as well. She did receive postoperative/post chemotherapy radiation therapy to the left chest wall, supraclavicular and axillary lymph node areas. Tolerating tamoxifen well. #2. Tick bite upper posterior thorax #3. Osteopenia receiving yearly zoledronic acid while she was on letrozole, currently on tamoxifen with probably no need for that intervention.    PLAN:  #1. Lyme titers IgM. #2. Empirical therapy with doxycycline 100 mg twice a day for at least 20 days with one refill. #3. Continue tamoxifen 20 mg daily. #4. Repeat right mammogram in July 2015. #5. Followup in 3 months with CBC, chem profile, CEA, and CA 27-29.   All questions were answered. The patient knows to call the clinic with any problems, questions or concerns. We can certainly see the patient much sooner if necessary.   I spent 25 minutes counseling the patient face to face. The total time spent in the appointment was 30 minutes.    Doroteo Bradford, MD 11/14/2013 10:32 AM

## 2013-11-14 NOTE — Progress Notes (Signed)
Labs drawn today for Lyme

## 2013-11-15 ENCOUNTER — Telehealth (HOSPITAL_COMMUNITY): Payer: Self-pay | Admitting: *Deleted

## 2013-11-15 LAB — B. BURGDORFI ANTIBODIES: B burgdorferi Ab IgG+IgM: 0.97 {ISR} — ABNORMAL HIGH

## 2013-11-15 NOTE — Telephone Encounter (Signed)
Message copied by Berneta Levins on Wed Nov 15, 2013  3:49 PM ------      Message from: Farrel Gobble A      Created: Wed Nov 15, 2013  3:41 PM      Regarding: Lyme antibody IgM positive       Please call Ms. Jacobs and tell her the Lyme antibody was positive and make sure she takes the Doxycline 100mg  BID for 20 days. Thanks. ------

## 2013-11-15 NOTE — Telephone Encounter (Signed)
Spoke with patient. Reviewed lab results. Discussed medication compliance. Verbalized understanding.

## 2013-11-16 LAB — B. BURGDORFI ANTIBODIES BY WB
B burgdorferi IgG Abs (IB): NEGATIVE
B burgdorferi IgM Abs (IB): NEGATIVE

## 2013-11-23 ENCOUNTER — Other Ambulatory Visit (HOSPITAL_COMMUNITY): Payer: Self-pay | Admitting: Oncology

## 2013-11-23 DIAGNOSIS — C50919 Malignant neoplasm of unspecified site of unspecified female breast: Secondary | ICD-10-CM

## 2013-11-23 MED ORDER — TAMOXIFEN CITRATE 20 MG PO TABS: 20.0000 mg | ORAL_TABLET | Freq: Every day | ORAL | Status: DC

## 2013-12-20 ENCOUNTER — Encounter (HOSPITAL_COMMUNITY): Payer: BC Managed Care – PPO | Attending: Oncology

## 2013-12-20 DIAGNOSIS — C50919 Malignant neoplasm of unspecified site of unspecified female breast: Secondary | ICD-10-CM | POA: Insufficient documentation

## 2013-12-20 DIAGNOSIS — Z452 Encounter for adjustment and management of vascular access device: Secondary | ICD-10-CM

## 2013-12-20 MED ORDER — HEPARIN SOD (PORK) LOCK FLUSH 100 UNIT/ML IV SOLN
INTRAVENOUS | Status: AC
  Filled 2013-12-20: qty 5

## 2013-12-20 MED ORDER — SODIUM CHLORIDE 0.9 % IJ SOLN
10.0000 mL | INTRAMUSCULAR | Status: DC | PRN
  Administered 2013-12-20: 10 mL via INTRAVENOUS

## 2013-12-20 MED ORDER — HEPARIN SOD (PORK) LOCK FLUSH 100 UNIT/ML IV SOLN
500.0000 [IU] | Freq: Once | INTRAVENOUS | Status: AC
  Administered 2013-12-20: 500 [IU] via INTRAVENOUS

## 2013-12-20 NOTE — Progress Notes (Signed)
[name] presented for Portacath access and flush. Portacath located rt chest wall accessed with  H 20 needle. Good blood return present. Portacath flushed with 46ml NS and 500U/70ml Heparin and needle removed intact. Procedure without incident. Patient tolerated procedure well.

## 2014-01-16 ENCOUNTER — Other Ambulatory Visit (HOSPITAL_COMMUNITY): Payer: Self-pay | Admitting: Oncology

## 2014-01-16 DIAGNOSIS — F329 Major depressive disorder, single episode, unspecified: Secondary | ICD-10-CM

## 2014-01-16 DIAGNOSIS — F32A Depression, unspecified: Secondary | ICD-10-CM

## 2014-01-16 MED ORDER — ESCITALOPRAM OXALATE 10 MG PO TABS: 10.0000 mg | ORAL_TABLET | Freq: Every day | ORAL | Status: DC

## 2014-02-12 ENCOUNTER — Encounter (HOSPITAL_COMMUNITY): Payer: BC Managed Care – PPO | Attending: Oncology

## 2014-02-12 DIAGNOSIS — C770 Secondary and unspecified malignant neoplasm of lymph nodes of head, face and neck: Secondary | ICD-10-CM

## 2014-02-12 DIAGNOSIS — Z9889 Other specified postprocedural states: Secondary | ICD-10-CM | POA: Insufficient documentation

## 2014-02-12 DIAGNOSIS — A6920 Lyme disease, unspecified: Secondary | ICD-10-CM | POA: Diagnosis present

## 2014-02-12 DIAGNOSIS — W57XXXA Bitten or stung by nonvenomous insect and other nonvenomous arthropods, initial encounter: Secondary | ICD-10-CM

## 2014-02-12 DIAGNOSIS — C50919 Malignant neoplasm of unspecified site of unspecified female breast: Secondary | ICD-10-CM | POA: Diagnosis present

## 2014-02-12 DIAGNOSIS — S30860A Insect bite (nonvenomous) of lower back and pelvis, initial encounter: Secondary | ICD-10-CM

## 2014-02-12 DIAGNOSIS — C50519 Malignant neoplasm of lower-outer quadrant of unspecified female breast: Secondary | ICD-10-CM

## 2014-02-12 DIAGNOSIS — Z95828 Presence of other vascular implants and grafts: Secondary | ICD-10-CM

## 2014-02-12 MED ORDER — HEPARIN SOD (PORK) LOCK FLUSH 100 UNIT/ML IV SOLN
500.0000 [IU] | Freq: Once | INTRAVENOUS | Status: DC
  Filled 2014-02-12: qty 5

## 2014-02-12 MED ORDER — SODIUM CHLORIDE 0.9 % IJ SOLN: 10.0000 mL | INTRAMUSCULAR | Status: DC | PRN

## 2014-02-13 ENCOUNTER — Encounter (HOSPITAL_BASED_OUTPATIENT_CLINIC_OR_DEPARTMENT_OTHER): Payer: BC Managed Care – PPO

## 2014-02-13 ENCOUNTER — Encounter (HOSPITAL_COMMUNITY): Payer: Self-pay

## 2014-02-13 VITALS — BP 158/79 | HR 70 | Temp 98.7°F | Resp 18 | Wt 140.9 lb

## 2014-02-13 DIAGNOSIS — Z170 Estrogen receptor positive status [ER+]: Secondary | ICD-10-CM

## 2014-02-13 DIAGNOSIS — C50912 Malignant neoplasm of unspecified site of left female breast: Secondary | ICD-10-CM

## 2014-02-13 DIAGNOSIS — C770 Secondary and unspecified malignant neoplasm of lymph nodes of head, face and neck: Secondary | ICD-10-CM

## 2014-02-13 DIAGNOSIS — A6920 Lyme disease, unspecified: Secondary | ICD-10-CM

## 2014-02-13 DIAGNOSIS — M899 Disorder of bone, unspecified: Secondary | ICD-10-CM

## 2014-02-13 DIAGNOSIS — M949 Disorder of cartilage, unspecified: Secondary | ICD-10-CM

## 2014-02-13 DIAGNOSIS — C50919 Malignant neoplasm of unspecified site of unspecified female breast: Secondary | ICD-10-CM

## 2014-02-13 DIAGNOSIS — C773 Secondary and unspecified malignant neoplasm of axilla and upper limb lymph nodes: Secondary | ICD-10-CM

## 2014-02-13 NOTE — Patient Instructions (Signed)
[facility] Discharge Instructions  RECOMMENDATIONS MADE BY THE CONSULTANT AND ANY TEST RESULTS WILL BE SENT TO YOUR REFERRING PHYSICIAN.  We will see you for a port flush in 6 weeks and you will see the doctor and have repeat lab work in 3 months.  We will call you if you have abnormal lab results.   Thank you for choosing [facility] to provide your oncology and hematology care.  To afford each patient quality time with our providers, please arrive at least 15 minutes before your scheduled appointment time.  With your help, our goal is to use those 15 minutes to complete the necessary work-up to ensure our physicians have the information they need to help with your evaluation and healthcare recommendations.    Effective January 1st, 2014, we ask that you re-schedule your appointment with our physicians should you arrive 10 or more minutes late for your appointment.  We strive to give you quality time with our providers, and arriving late affects you and other patients whose appointments are after yours.    Again, thank you for choosing [facility].  Our hope is that these requests will decrease the amount of time that you wait before being seen by our physicians.       _____________________________________________________________  Should you have questions after your visit to [facility], please contact our office at [phone] between the hours of 8:30 a.m. and 4:30 p.m.  Voicemails left after 4:30 p.m. will not be returned until the following business day.  For prescription refill requests, have your pharmacy contact our office with your prescription refill request.    _______________________________________________________________  We hope that we have given you very good care.  You may receive a patient satisfaction survey in the mail, please complete it and return it as soon as possible.  We value your  feedback!  _______________________________________________________________  Have you asked about our STAR program?  STAR stands for Survivorship Training and Rehabilitation, and this is a nationally recognized cancer care program that focuses on survivorship and rehabilitation.  Cancer and cancer treatments may cause problems, such as, pain, making you feel tired and keeping you from doing the things that you need or want to do. Cancer rehabilitation can help. Our goal is to reduce these troubling effects and help you have the best quality of life possible.  You may receive a survey from a nurse that asks questions about your current state of health.  Based on the survey results, all eligible patients will be referred to the [facility] program for an evaluation so we can better serve you!  A frequently asked questions sheet is available upon request.

## 2014-02-13 NOTE — Progress Notes (Signed)
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 Blt. IgG & IgM w/bands, LYME IGG/IGM AB, B. burgdorfi antibodies by WB, CBC with Differential, Comprehensive metabolic panel, CEA, Cancer antigen 27.29  No chief complaint on file.   CURRENT THERAPY: Tamoxifen 20 mg daily  INTERVAL HISTORY: [name] 63 y.o. female returns for followup of recurrent breast cancer currently taking tamoxifen 20 mg daily since August of 2012 with recurrence occurring while on letrozole in the left cervical regular area, biopsy proven. She was seen on 11/14/2013 4 days after being bitten by attack in the upper part of the back and at that time was started empirically on doxycycline 100 mg twice a day. IgG plus IgM antibodies were elevated at 0.97, an equivocal result and the patient is here today for repeat studies. Tic bite area has healed completely. She denies any significant joint discomfort. Hot flashes are still present but not as severe as when she first 22 menopause. She denies any vaginal discharge or urinary incontinence, lower extremity swelling or redness, PND, orthopnea, palpitations, skin rash, headache, or seizures. Repeat Lyme titers were drawn yesterday and are still pending.  MEDICAL HISTORY: Past Medical History  Diagnosis Date  . Osteoporosis 01/27/2011  . Borderline hypertension   . Pneumonia   . Bladder infection   . Fatigue   . Generalized headaches   . Wears glasses   . Swollen lymph nodes   . Retinal detachment   . Reflux   . URI (upper respiratory infection) 02/17/12  . 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 Breast cancer   . Kidney infection   . Post-mastectomy  lymphedema syndrome   . Closed fracture of humerus sept 2013  . Port catheter in place 04/14/2013    INTERIM HISTORY: has Adenocarcinoma of left breast; Osteoporosis; and Port catheter in place on her problem list.   Recurrent breast cancer to left supraclavicular lymph node, biopsy-proven, ER +100%, PR +40%, HER-2/neu nonamplified. This biopsy took place on 03/30/2011 and she has been on tamoxifen since then because of previous failure while on letrozole. 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 Initially treated with dose dense Epirubicin and Cytoxan followed by Docetaxel and Herceptin, the latter for 52 weeks. The docetaxel was given for 4 cycles, epirubicin and Cytoxan for 4 cycles as well. She did receive postoperative/post chemotherapy radiation therapy to the left chest wall, supraclavicular and axillary lymph node areas  ALLERGIES:  is allergic to ciprofloxacin.  MEDICATIONS: has a current medication list which includes the following prescription(s): calcium-vitamin d, diphenhydramine, doxycycline, escitalopram, folic acid, metoprolol tartrate, tamoxifen, and vitamin c.  SURGICAL HISTORY:  Past Surgical History  Procedure Laterality Date  . Cataract extraction    . Cervical disc fusioni    . Retinal detachment and repair    . Btl    . Mastectomy modified radical    . Port-a-cath removal      in place (not removed)  . Biopsy of lymph node      super clavicle  .  Breast surgery      FAMILY HISTORY: family history includes Heart disease in her father and mother; Hypertension in her mother; Other in her father.  SOCIAL HISTORY:  reports that she has never smoked. She has never used smokeless tobacco. She reports that she drinks about 4.2 ounces of alcohol per week. She reports that she does not use illicit  drugs.  REVIEW OF SYSTEMS:  Other than that discussed above is noncontributory.  PHYSICAL EXAMINATION: ECOG PERFORMANCE STATUS: 1 - Symptomatic but completely ambulatory  Blood pressure 158/79, pulse 70, temperature 98.7 F (37.1 C), temperature source Oral, resp. rate 18, weight 140 lb 14.4 oz (63.912 kg).  GENERAL:alert, no distress and comfortable SKIN: skin color, texture, turgor are normal, no rashes or significant lesions EYES: PERLA; Conjunctiva are pink and non-injected, sclera clear SINUSES: No redness or tenderness over maxillary or ethmoid sinuses OROPHARYNX:no exudate, no erythema on lips, buccal mucosa, or tongue. NECK: supple, thyroid normal size, non-tender, without nodularity. No masses right previously noted tic bite area in the posterior neck is completely healed. CHEST: Status post left mastectomy with Lahey type changes from radiation. No axillary masses. Right breast without mass. LYMPH:  no palpable lymphadenopathy in the cervical, axillary or inguinal LUNGS: clear to auscultation and percussion with normal breathing effort HEART: regular rate & rhythm and no murmurs. ABDOMEN:abdomen soft, non-tender and normal bowel sounds MUSCULOSKELETAL:no cyanosis of digits and no clubbing. Range of motion normal.  NEURO: alert & oriented x 3 with fluent speech, no focal motor/sensory deficits   LABORATORY DATA: No visits with results within 30 Day(s) from this visit. Latest known visit with results is:  Infusion on 11/14/2013  Component Date Value Ref Range Status  . B burgdorferi Ab IgG+IgM 11/14/2013 0.97*  Final   Comment: (NOTE)                          Repeat testing in 10-14 days may be helpful.                            ISR = Immune Status Ratio                                      <0.90         ISR       Negative                                      0.90 - 1.09   ISR       Equivocal                                      >=1.10        ISR       Positive                           Performed at [facility]  . B burgdorferi IgG Abs (IB) 11/14/2013 Negative   Final   Comment: (NOTE)                          **  Reference Range for each analyte: Negative or Non Reactive **                          IgG Positive: Any five of the following ten bands: 18, 23, 28, 30,                          39, 41, 45, 58, 66, 93 kDa.                          IgG Negative: Any pattern that does not meet the IgG positive                          criteria.  . B burgdorferi IgM Abs (IB) 11/14/2013 Negative   Final   Comment: (NOTE)                          ** Reference Range for each analyte: Negative or Non Reactive **                          IgM Positive: Any two of the following three bands: 23, 39, 41 kDa.                          IgM Negative: Any pattern that does not meet the IgM positive                          criteria.                          A positive IgM test result alone is not recommended for use in                          determining active disease due to a high likelihood of a                          false-positive result in persons with illness greater than 1 month                          duration.                          The CDC recommends testing by the EIA antibody test, followed by the                          Immunoblot confirmatory test only when the EIA test is positive.                          Performed at [facility]: No new pathology.  Urinalysis    Component Value Date/Time   COLORURINE YELLOW 08/06/2008 1500   APPEARANCEUR CLEAR 08/06/2008 1500   LABSPEC <1.005* 08/06/2008 1500   PHURINE 6.5 08/06/2008 1500   GLUCOSEU NEGATIVE 08/06/2008 1500   HGBUR SMALL* 08/06/2008 1500   BILIRUBINUR NEGATIVE 08/06/2008 1500   KETONESUR NEGATIVE 08/06/2008 1500   PROTEINUR NEGATIVE 08/06/2008 1500  UROBILINOGEN 0.2 08/06/2008 1500   NITRITE NEGATIVE 08/06/2008 1500   LEUKOCYTESUR NEGATIVE 08/06/2008 1500     RADIOGRAPHIC STUDIES: No results found.  ASSESSMENT:  #1.Recurrent breast cancer to left supraclavicular lymph node, biopsy-proven, ER +100%, PR +40%, HER-2/neu nonamplified. This biopsy took place on 03/30/2011 and she has been on tamoxifen since then because of previous failure while on letrozole. 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 Initially treated with dose dense Epirubicin and Cytoxan followed by Docetaxel and Herceptin, the latter for 52 weeks. The docetaxel was given for 4 cycles, epirubicin and Cytoxan for 4 cycles as well. She did receive postoperative/post chemotherapy radiation therapy to the left chest wall, supraclavicular and axillary lymph node areas. Tolerating tamoxifen well.  #2. Tick bite upper posterior thorax was equivocal serology for Lyme disease, status post therapy with doxycycline 100 mg twice a day for 3 weeks given in April 2015, weighing repeat Lyme titers today.  #3. Osteopenia receiving yearly zoledronic acid while she was on letrozole, currently on tamoxifen with probably no need for that intervention.      PLAN:  #1. Await Lyme titer result followup. #2. Six-week followup for port cath flush. #3. Office visit 3 months with CBC, chem profile, CEA, CA 27-29. If Lyme titers are still elevated, repeat value will be done at that time.   All questions were answered. The patient knows to call the clinic with any problems, questions or concerns. We can certainly see the patient much sooner if necessary.   I spent 25 minutes counseling the patient face to face. The total time spent in the appointment was 30 minutes.    Doroteo Bradford, MD 02/14/2014 6:39 AM  DISCLAIMER:  This note was dictated with voice recognition software.  Similar sounding words can inadvertently be  transcribed inaccurately and may not be corrected upon review.

## 2014-03-02 ENCOUNTER — Ambulatory Visit (HOSPITAL_COMMUNITY): Payer: BC Managed Care – PPO

## 2014-03-22 LAB — MISCELLANEOUS TEST: Miscellaneous Test: 83907

## 2014-03-23 ENCOUNTER — Other Ambulatory Visit (HOSPITAL_COMMUNITY): Payer: Self-pay | Admitting: Hematology and Oncology

## 2014-03-23 ENCOUNTER — Other Ambulatory Visit (HOSPITAL_COMMUNITY): Payer: Self-pay | Admitting: *Deleted

## 2014-03-23 DIAGNOSIS — Z95828 Presence of other vascular implants and grafts: Secondary | ICD-10-CM

## 2014-03-23 MED ORDER — LIDOCAINE-PRILOCAINE 2.5-2.5 % EX CREA: TOPICAL_CREAM | CUTANEOUS | Status: DC

## 2014-03-23 NOTE — Progress Notes (Signed)
Rx for emla cream sent to [facility]

## 2014-03-27 ENCOUNTER — Encounter (HOSPITAL_COMMUNITY): Payer: BC Managed Care – PPO | Attending: Oncology

## 2014-03-27 DIAGNOSIS — C773 Secondary and unspecified malignant neoplasm of axilla and upper limb lymph nodes: Secondary | ICD-10-CM

## 2014-03-27 DIAGNOSIS — Z452 Encounter for adjustment and management of vascular access device: Secondary | ICD-10-CM

## 2014-03-27 DIAGNOSIS — C50919 Malignant neoplasm of unspecified site of unspecified female breast: Secondary | ICD-10-CM | POA: Insufficient documentation

## 2014-03-27 DIAGNOSIS — C770 Secondary and unspecified malignant neoplasm of lymph nodes of head, face and neck: Secondary | ICD-10-CM

## 2014-03-27 DIAGNOSIS — Z9889 Other specified postprocedural states: Secondary | ICD-10-CM | POA: Insufficient documentation

## 2014-03-27 DIAGNOSIS — A6920 Lyme disease, unspecified: Secondary | ICD-10-CM | POA: Insufficient documentation

## 2014-03-27 MED ORDER — HEPARIN SOD (PORK) LOCK FLUSH 100 UNIT/ML IV SOLN
500.0000 [IU] | Freq: Once | INTRAVENOUS | Status: AC
Start: 2014-03-27 — End: 2014-03-27
  Administered 2014-03-27: 500 [IU] via INTRAVENOUS

## 2014-03-27 MED ORDER — HEPARIN SOD (PORK) LOCK FLUSH 100 UNIT/ML IV SOLN
INTRAVENOUS | Status: AC
  Filled 2014-03-27: qty 5

## 2014-03-27 MED ORDER — SODIUM CHLORIDE 0.9 % IJ SOLN
10.0000 mL | Freq: Once | INTRAMUSCULAR | Status: AC
  Administered 2014-03-27: 10 mL via INTRAVENOUS

## 2014-03-27 NOTE — Progress Notes (Signed)
[name] presented for Portacath access and flush. Proper placement of portacath confirmed by CXR. Portacath located rt chest wall accessed with  H 20 needle. Good blood return present. Portacath flushed with 23ml NS and 500U/48ml Heparin and needle removed intact. Procedure without incident. Patient tolerated procedure well.

## 2014-05-08 ENCOUNTER — Encounter (HOSPITAL_BASED_OUTPATIENT_CLINIC_OR_DEPARTMENT_OTHER): Payer: BC Managed Care – PPO

## 2014-05-08 ENCOUNTER — Encounter (HOSPITAL_COMMUNITY): Payer: BC Managed Care – PPO | Attending: Oncology

## 2014-05-08 ENCOUNTER — Encounter (HOSPITAL_COMMUNITY): Payer: Self-pay

## 2014-05-08 VITALS — BP 138/64 | HR 64 | Temp 98.6°F | Resp 16 | Wt 144.4 lb

## 2014-05-08 DIAGNOSIS — C773 Secondary and unspecified malignant neoplasm of axilla and upper limb lymph nodes: Secondary | ICD-10-CM

## 2014-05-08 DIAGNOSIS — A6920 Lyme disease, unspecified: Secondary | ICD-10-CM | POA: Diagnosis present

## 2014-05-08 DIAGNOSIS — C50919 Malignant neoplasm of unspecified site of unspecified female breast: Secondary | ICD-10-CM

## 2014-05-08 DIAGNOSIS — Z9889 Other specified postprocedural states: Secondary | ICD-10-CM | POA: Diagnosis present

## 2014-05-08 DIAGNOSIS — C50912 Malignant neoplasm of unspecified site of left female breast: Secondary | ICD-10-CM

## 2014-05-08 DIAGNOSIS — C770 Secondary and unspecified malignant neoplasm of lymph nodes of head, face and neck: Secondary | ICD-10-CM

## 2014-05-08 DIAGNOSIS — M949 Disorder of cartilage, unspecified: Secondary | ICD-10-CM

## 2014-05-08 DIAGNOSIS — M810 Age-related osteoporosis without current pathological fracture: Secondary | ICD-10-CM

## 2014-05-08 DIAGNOSIS — M899 Disorder of bone, unspecified: Secondary | ICD-10-CM

## 2014-05-08 LAB — CBC WITH DIFFERENTIAL/PLATELET
Basophils Absolute: 0 10*3/uL (ref 0.0–0.1)
Basophils Relative: 1 % (ref 0–1)
Eosinophils Absolute: 0.1 10*3/uL (ref 0.0–0.7)
Eosinophils Relative: 1 % (ref 0–5)
HCT: 38.1 % (ref 36.0–46.0)
Hemoglobin: 12.7 g/dL (ref 12.0–15.0)
Lymphocytes Relative: 24 % (ref 12–46)
Lymphs Abs: 1.8 10*3/uL (ref 0.7–4.0)
MCH: 32.8 pg (ref 26.0–34.0)
MCHC: 33.3 g/dL (ref 30.0–36.0)
MCV: 98.4 fL (ref 78.0–100.0)
Monocytes Absolute: 0.5 10*3/uL (ref 0.1–1.0)
Monocytes Relative: 7 % (ref 3–12)
Neutro Abs: 5 10*3/uL (ref 1.7–7.7)
Neutrophils Relative %: 67 % (ref 43–77)
Platelets: 257 10*3/uL (ref 150–400)
RBC: 3.87 MIL/uL (ref 3.87–5.11)
RDW: 13.3 % (ref 11.5–15.5)
WBC: 7.4 10*3/uL (ref 4.0–10.5)

## 2014-05-08 LAB — COMPREHENSIVE METABOLIC PANEL
ALT: 23 U/L (ref 0–35)
AST: 28 U/L (ref 0–37)
Albumin: 3.6 g/dL (ref 3.5–5.2)
Alkaline Phosphatase: 76 U/L (ref 39–117)
Anion gap: 11 (ref 5–15)
BUN: 17 mg/dL (ref 6–23)
CO2: 26 mEq/L (ref 19–32)
Calcium: 9.2 mg/dL (ref 8.4–10.5)
Chloride: 100 mEq/L (ref 96–112)
Creatinine, Ser: 1.28 mg/dL — ABNORMAL HIGH (ref 0.50–1.10)
GFR calc Af Amer: 51 mL/min — ABNORMAL LOW (ref >90)
GFR calc non Af Amer: 44 mL/min — ABNORMAL LOW (ref >90)
Glucose, Bld: 102 mg/dL — ABNORMAL HIGH (ref 70–99)
Potassium: 4 mEq/L (ref 3.7–5.3)
Sodium: 137 mEq/L (ref 137–147)
Total Bilirubin: 0.3 mg/dL (ref 0.3–1.2)
Total Protein: 6.7 g/dL (ref 6.0–8.3)

## 2014-05-08 MED ORDER — HEPARIN SOD (PORK) LOCK FLUSH 100 UNIT/ML IV SOLN
500.0000 [IU] | Freq: Once | INTRAVENOUS | Status: AC
  Administered 2014-05-08: 500 [IU] via INTRAVENOUS

## 2014-05-08 MED ORDER — HEPARIN SOD (PORK) LOCK FLUSH 100 UNIT/ML IV SOLN
INTRAVENOUS | Status: AC
  Filled 2014-05-08: qty 5

## 2014-05-08 MED ORDER — SODIUM CHLORIDE 0.9 % IJ SOLN
10.0000 mL | INTRAMUSCULAR | Status: DC | PRN
  Administered 2014-05-08: 10 mL via INTRAVENOUS

## 2014-05-08 NOTE — Progress Notes (Signed)
[facility]  OFFICE PROGRESS NOTE  [place], Thedore Mins, MD  [place] [address]  DIAGNOSIS: Adenocarcinoma of breast, left - Plan: AMLODIPINE BESYLATE PO, heparin lock flush 100 unit/mL, sodium chloride 0.9 % injection 10 mL, Ambulatory referral to Physical Therapy  Metastasis to supraclavicular lymph node - Plan: AMLODIPINE BESYLATE PO, heparin lock flush 100 unit/mL, sodium chloride 0.9 % injection 10 mL, Ambulatory referral to Physical Therapy  Osteoporosis - Plan: AMLODIPINE BESYLATE PO, heparin lock flush 100 unit/mL, sodium chloride 0.9 % injection 10 mL, Ambulatory referral to Physical Therapy  Chief Complaint  Patient presents with  . Breast Cancer    CURRENT THERAPY: Tamoxifen 20 mg daily  INTERVAL HISTORY: [name] 63 y.o. female returns for followup of recurrent breast cancer currently taking tamoxifen 20 mg daily since August of 2012 with recurrence occurring while on letrozole in the left cervical regular area, biopsy proven.  Borellia IgG and IgM antibodies 11/14/2013 were negative. She had been treated empirically with doxycycline after a tick bite on the upper part of the thorax on 11/14/2013. Repeat values on 02/12/2014 were also negative. She continues to do well with no generalized arthralgias or muscle aches. Appetite is good with no nausea, vomiting, diarrhea, constipation, dysuria, hematuria, melena, hematochezia, lower extremity swelling or redness, chest pain, PND, orthopnea, palpitations, skin rash, headache, or seizures. She still has hot flashes but they're not overly burdensome.    MEDICAL HISTORY: Past Medical History  Diagnosis Date  . Osteoporosis 01/27/2011  . Borderline hypertension   . Pneumonia   . Bladder infection   . Fatigue   . Generalized headaches   . Wears glasses   . Swollen lymph nodes   . Retinal detachment   . Reflux   . URI (upper respiratory infection) 02/17/12  . Adenocarcinoma  of breast 01/27/2011    stage IIIc breast cancer  . Breast cancer   . Kidney infection   . Post-mastectomy lymphedema syndrome   . Closed fracture of humerus sept 2013  . Port catheter in place 04/14/2013    INTERIM HISTORY: has Adenocarcinoma of left breast; Osteoporosis; and Port catheter in place on her problem list.    ALLERGIES:  is allergic to ciprofloxacin.  MEDICATIONS: has a current medication list which includes the following prescription(s): amlodipine besylate, calcium-vitamin d, diphenhydramine, escitalopram, folic acid, lidocaine-prilocaine, metoprolol tartrate, tamoxifen, and vitamin c, and the following Facility-Administered Medications: sodium chloride.  SURGICAL HISTORY:  Past Surgical History  Procedure Laterality Date  . Cataract extraction    . Cervical disc fusioni    . Retinal detachment and repair    . Btl    . Mastectomy modified radical    . Port-a-cath removal      in place (not removed)  . Biopsy of lymph node      super clavicle  . Breast surgery      FAMILY HISTORY: family history includes Heart disease in her father and mother; Hypertension in her mother; Other in her father.  SOCIAL HISTORY:  reports that she has never smoked. She has never used smokeless tobacco. She reports that she drinks about 4.2 ounces of alcohol per week. She reports that she does not use illicit drugs.  REVIEW OF SYSTEMS:  Other than that discussed above is noncontributory.  PHYSICAL EXAMINATION: ECOG PERFORMANCE STATUS: 1 - Symptomatic but completely ambulatory  Blood pressure 138/64, pulse 64, temperature 98.6 F (37 C), temperature  source Oral, resp. rate 16, weight 144 lb 6.4 oz (65.499 kg), SpO2 98.00%.  GENERAL:alert, no distress and comfortable SKIN: skin color, texture, turgor are normal, no rashes or significant lesions EYES: PERLA; Conjunctiva are pink and non-injected, sclera clear SINUSES: No redness or tenderness over maxillary or ethmoid  sinuses OROPHARYNX:no exudate, no erythema on lips, buccal mucosa, or tongue. NECK: supple, thyroid normal size, non-tender, without nodularity. No masses CHEST: Status post left mastectomy with planning ectatic changes from radiation. Right breast without mass. Port-A-Cath in place. LYMPH:  no palpable lymphadenopathy in the cervical, axillary or inguinal LUNGS: clear to auscultation and percussion with normal breathing effort HEART: regular rate & rhythm and no murmurs. ABDOMEN:abdomen soft, non-tender and normal bowel sounds MUSCULOSKELETAL:no cyanosis of digits and no clubbing. Range of motion normal. +1 left upper extremity lymphedema. NEURO: alert & oriented x 3 with fluent speech, no focal motor/sensory deficits   LABORATORY DATA: Appointment on 05/08/2014  Component Date Value Ref Range Status  . WBC 05/08/2014 7.4  4.0 - 10.5 K/uL Final  . RBC 05/08/2014 3.87  3.87 - 5.11 MIL/uL Final  . Hemoglobin 05/08/2014 12.7  12.0 - 15.0 g/dL Final  . HCT 05/08/2014 38.1  36.0 - 46.0 % Final  . MCV 05/08/2014 98.4  78.0 - 100.0 fL Final  . MCH 05/08/2014 32.8  26.0 - 34.0 pg Final  . MCHC 05/08/2014 33.3  30.0 - 36.0 g/dL Final  . RDW 05/08/2014 13.3  11.5 - 15.5 % Final  . Platelets 05/08/2014 257  150 - 400 K/uL Final  . Neutrophils Relative % 05/08/2014 67  43 - 77 % Final  . Neutro Abs 05/08/2014 5.0  1.7 - 7.7 K/uL Final  . Lymphocytes Relative 05/08/2014 24  12 - 46 % Final  . Lymphs Abs 05/08/2014 1.8  0.7 - 4.0 K/uL Final  . Monocytes Relative 05/08/2014 7  3 - 12 % Final  . Monocytes Absolute 05/08/2014 0.5  0.1 - 1.0 K/uL Final  . Eosinophils Relative 05/08/2014 1  0 - 5 % Final  . Eosinophils Absolute 05/08/2014 0.1  0.0 - 0.7 K/uL Final  . Basophils Relative 05/08/2014 1  0 - 1 % Final  . Basophils Absolute 05/08/2014 0.0  0.0 - 0.1 K/uL Final  . Sodium 05/08/2014 137  137 - 147 mEq/L Final  . Potassium 05/08/2014 4.0  3.7 - 5.3 mEq/L Final  . Chloride 05/08/2014 100  96  - 112 mEq/L Final  . CO2 05/08/2014 26  19 - 32 mEq/L Final  . Glucose, Bld 05/08/2014 102* 70 - 99 mg/dL Final  . BUN 05/08/2014 17  6 - 23 mg/dL Final  . Creatinine, Ser 05/08/2014 1.28* 0.50 - 1.10 mg/dL Final  . Calcium 05/08/2014 9.2  8.4 - 10.5 mg/dL Final  . Total Protein 05/08/2014 6.7  6.0 - 8.3 g/dL Final  . Albumin 05/08/2014 3.6  3.5 - 5.2 g/dL Final  . AST 05/08/2014 28  0 - 37 U/L Final  . ALT 05/08/2014 23  0 - 35 U/L Final  . Alkaline Phosphatase 05/08/2014 76  39 - 117 U/L Final  . Total Bilirubin 05/08/2014 0.3  0.3 - 1.2 mg/dL Final  . GFR calc non Af Amer 05/08/2014 44* >90 mL/min Final  . GFR calc Af Amer 05/08/2014 51* >90 mL/min Final   Comment: (NOTE)                          The  eGFR has been calculated using the CKD EPI equation.                          This calculation has not been validated in all clinical situations.                          eGFR's persistently <90 mL/min signify possible Chronic Kidney                          Disease.  . Anion gap 05/08/2014 11  5 - 15 Final    PATHOLOGY: No new pathology.  Urinalysis    Component Value Date/Time   COLORURINE YELLOW 08/06/2008 1500   APPEARANCEUR CLEAR 08/06/2008 1500   LABSPEC <1.005* 08/06/2008 1500   PHURINE 6.5 08/06/2008 1500   GLUCOSEU NEGATIVE 08/06/2008 1500   HGBUR SMALL* 08/06/2008 1500   BILIRUBINUR NEGATIVE 08/06/2008 1500   KETONESUR NEGATIVE 08/06/2008 1500   PROTEINUR NEGATIVE 08/06/2008 1500   UROBILINOGEN 0.2 08/06/2008 1500   NITRITE NEGATIVE 08/06/2008 1500   LEUKOCYTESUR NEGATIVE 08/06/2008 1500    RADIOGRAPHIC STUDIES: No results found.  ASSESSMENT:  #1.Recurrent breast cancer to left supraclavicular lymph node, biopsy-proven, ER +100%, PR +40%, HER-2/neu nonamplified. This biopsy took place on 03/30/2011 and she has been on tamoxifen since then because of previous failure while on letrozole. Patient initially was diagnosed with stage IIIc adenocarcinoma the left  breast with clinically positive axillary and supraclavicular lymph nodes, ER +100%, PR +5%, HER-2/neu nonamplified by IHC, but positive by CIS age. Initially treated with dose dense Epirubicin and Cytoxan followed by Docetaxel and Herceptin, the latter for 52 weeks. The docetaxel was given for 4 cycles, epirubicin and Cytoxan for 4 cycles as well. She did receive postoperative/post chemotherapy radiation therapy to the left chest wall, supraclavicular and axillary lymph node areas. Tolerating tamoxifen well.  #2. Tick bite upper posterior thorax was equivocal serology for Lyme disease, status post therapy with doxycycline 100 mg twice a day for 3 weeks given in April 2015, no evidence of Lyme disease. #3. Osteopenia receiving yearly zoledronic acid while she was on letrozole, currently on tamoxifen with probably no need for that intervention.       PLAN:  #1. Continue tamoxifen 20 mg daily. #2. Port flush every 6 weeks. #3. Followup in 6 months with CBC, chem profile.   All questions were answered. The patient knows to call the clinic with any problems, questions or concerns. We can certainly see the patient much sooner if necessary.   I spent 25 minutes counseling the patient face to face. The total time spent in the appointment was 30 minutes.    Doroteo Bradford, MD 05/08/2014 7:36 PM  DISCLAIMER:  This note was dictated with voice recognition software.  Similar sounding words can inadvertently be transcribed inaccurately and may not be corrected upon review.

## 2014-05-08 NOTE — Patient Instructions (Signed)
[facility] Discharge Instructions  RECOMMENDATIONS MADE BY THE CONSULTANT AND ANY TEST RESULTS WILL BE SENT TO YOUR REFERRING PHYSICIAN.  EXAM FINDINGS BY THE PHYSICIAN TODAY AND SIGNS OR SYMPTOMS TO REPORT TO CLINIC OR PRIMARY PHYSICIAN: Exam and findings as discussed by Dr. Barnet Glasgow.  Labs are stable. Report any new lumps, bone pain, shortness of breath or other symptoms.  MEDICATIONS PRESCRIBED:  Continue tamoxifen  INSTRUCTIONS/FOLLOW-UP: Port flushes every 6 weeks and follow-up in 6 months.  Thank you for choosing [facility] to provide your oncology and hematology care.  To afford each patient quality time with our providers, please arrive at least 15 minutes before your scheduled appointment time.  With your help, our goal is to use those 15 minutes to complete the necessary work-up to ensure our physicians have the information they need to help with your evaluation and healthcare recommendations.    Effective January 1st, 2014, we ask that you re-schedule your appointment with our physicians should you arrive 10 or more minutes late for your appointment.  We strive to give you quality time with our providers, and arriving late affects you and other patients whose appointments are after yours.    Again, thank you for choosing [facility].  Our hope is that these requests will decrease the amount of time that you wait before being seen by our physicians.       _____________________________________________________________  Should you have questions after your visit to [facility], please contact our office at [phone] between the hours of 8:30 a.m. and 4:30 p.m.  Voicemails left after 4:30 p.m. will not be returned until the following business day.  For prescription refill requests, have your pharmacy contact our office with your prescription refill request.     _______________________________________________________________  We hope that we have given you very good care.  You may receive a patient satisfaction survey in the mail, please complete it and return it as soon as possible.  We value your feedback!  _______________________________________________________________  Have you asked about our STAR program?  STAR stands for Survivorship Training and Rehabilitation, and this is a nationally recognized cancer care program that focuses on survivorship and rehabilitation.  Cancer and cancer treatments may cause problems, such as, pain, making you feel tired and keeping you from doing the things that you need or want to do. Cancer rehabilitation can help. Our goal is to reduce these troubling effects and help you have the best quality of life possible.  You may receive a survey from a nurse that asks questions about your current state of health.  Based on the survey results, all eligible patients will be referred to the [facility] program for an evaluation so we can better serve you!  A frequently asked questions sheet is available upon request.

## 2014-05-08 NOTE — Progress Notes (Signed)
[name] presented for labwork. Labs per MD order drawn via Portacath located in the right chest wall accessed with  H 20 needle. Good blood return present. Procedure without incident.  Needle removed intact. Patient tolerated procedure well.  STAR Program Physical Impairment and Functional Assessment Screening Tool  1. Are you having any pain, including headaches, joint pain, or muscle pain (upper body = OT; lower body = PT)?  No  2. Do your hands and/or feet feel numb or tingle (PT)?  Yes, but I hand this before my cancer diagnosis.  3. Does any part of your body feel swollen or larger than usual (upper body = OT; lower body = PT)?  No  4. Are you so tired that you cannot do the things you want or need to do (PT or OT)?  No  5. Are you feeling weak or are you having trouble moving any part of your body (PT/OT)?  No  6. Are you having trouble concentrating, thinking, or remembering things (OT/ST)?  Yes, this started after my diagnosis and is still a problem.  7. Are you having trouble moving around or feel like you might trip or fall (PT)?  No  8. Are you having trouble swallowing (ST)?  No  9. Are you having trouble speaking (ST)?  No  10. Are you having trouble with going or getting to the bathroom (OT)?  No  11. Are you having trouble with your sexual function (OT)?  No  12. Are you having trouble lifting things, even just your arms (OT/PT)?  No  13. Are you having trouble taking care of yourself as in dressing or bathing (OT)?  No  14. Are you having trouble with daily tasks like chores or shopping (OT)?  No  15. Are you having trouble driving (OT)?  No  16. Are you having trouble returning to work or completing your tasks at work (OT)?  No  Other concerns:    Legend: OT = Occupational Therapy PT = Physical Therapy ST = Speech Therapy

## 2014-05-09 ENCOUNTER — Telehealth (HOSPITAL_COMMUNITY): Payer: Self-pay

## 2014-05-09 ENCOUNTER — Other Ambulatory Visit (HOSPITAL_COMMUNITY): Payer: Self-pay | Admitting: Hematology and Oncology

## 2014-05-09 DIAGNOSIS — C50919 Malignant neoplasm of unspecified site of unspecified female breast: Secondary | ICD-10-CM

## 2014-05-09 LAB — CANCER ANTIGEN 27.29: CA 27.29: 49 U/mL — ABNORMAL HIGH (ref 0–39)

## 2014-05-09 LAB — CEA: CEA: 0.5 ng/mL (ref 0.0–5.0)

## 2014-05-09 NOTE — Telephone Encounter (Signed)
Patient notified and will return on Monday 05/14/14 for repeat CA27-29.

## 2014-05-09 NOTE — Telephone Encounter (Signed)
Message copied by Mellissa Kohut on Wed May 09, 2014 12:08 PM ------      Message from: [place], [place]: Wed May 09, 2014  6:35 AM       CA 27-29 was elevated. Please arrange to have the test repeated and if still elevated, have Tom order a PET/CT scan to stage, possible recurrent breast cancer. ------

## 2014-05-14 ENCOUNTER — Encounter (HOSPITAL_COMMUNITY): Payer: BC Managed Care – PPO | Attending: Oncology

## 2014-05-14 DIAGNOSIS — C50919 Malignant neoplasm of unspecified site of unspecified female breast: Secondary | ICD-10-CM

## 2014-05-14 DIAGNOSIS — C770 Secondary and unspecified malignant neoplasm of lymph nodes of head, face and neck: Secondary | ICD-10-CM | POA: Diagnosis present

## 2014-05-14 DIAGNOSIS — A6920 Lyme disease, unspecified: Secondary | ICD-10-CM | POA: Insufficient documentation

## 2014-05-14 DIAGNOSIS — C50912 Malignant neoplasm of unspecified site of left female breast: Secondary | ICD-10-CM | POA: Diagnosis not present

## 2014-05-14 DIAGNOSIS — M810 Age-related osteoporosis without current pathological fracture: Secondary | ICD-10-CM | POA: Insufficient documentation

## 2014-05-14 NOTE — Progress Notes (Signed)
LABS FOR [phone]

## 2014-05-15 LAB — CANCER ANTIGEN 27.29: CA 27.29: 58 U/mL — ABNORMAL HIGH (ref 0–39)

## 2014-05-18 ENCOUNTER — Other Ambulatory Visit (HOSPITAL_COMMUNITY): Payer: Self-pay | Admitting: Oncology

## 2014-05-18 DIAGNOSIS — C50912 Malignant neoplasm of unspecified site of left female breast: Secondary | ICD-10-CM

## 2014-05-18 MED ORDER — TAMOXIFEN CITRATE 20 MG PO TABS: 20.0000 mg | ORAL_TABLET | Freq: Every day | ORAL | Status: DC

## 2014-05-19 ENCOUNTER — Other Ambulatory Visit (HOSPITAL_COMMUNITY): Payer: Self-pay | Admitting: Hematology and Oncology

## 2014-05-19 DIAGNOSIS — C50919 Malignant neoplasm of unspecified site of unspecified female breast: Secondary | ICD-10-CM

## 2014-05-25 ENCOUNTER — Encounter (HOSPITAL_COMMUNITY): Payer: Self-pay

## 2014-05-25 ENCOUNTER — Ambulatory Visit (HOSPITAL_COMMUNITY)
Admission: RE | Admit: 2014-05-25 | Discharge: 2014-05-25 | Disposition: A | Discharge status: Home / Self Care | Payer: BC Managed Care – PPO | Source: Ambulatory Visit | Attending: Hematology and Oncology | Admitting: Hematology and Oncology

## 2014-05-25 DIAGNOSIS — C50919 Malignant neoplasm of unspecified site of unspecified female breast: Secondary | ICD-10-CM | POA: Diagnosis not present

## 2014-05-25 LAB — GLUCOSE, CAPILLARY: Glucose-Capillary: 99 mg/dL (ref 70–99)

## 2014-05-25 MED ORDER — FLUDEOXYGLUCOSE F - 18 (FDG) INJECTION
7.2000 | Freq: Once | INTRAVENOUS | Status: AC | PRN
  Administered 2014-05-25: 7.2 via INTRAVENOUS

## 2014-05-28 ENCOUNTER — Other Ambulatory Visit (HOSPITAL_COMMUNITY): Payer: Self-pay | Admitting: Oncology

## 2014-05-31 ENCOUNTER — Encounter (HOSPITAL_BASED_OUTPATIENT_CLINIC_OR_DEPARTMENT_OTHER): Payer: BC Managed Care – PPO

## 2014-05-31 ENCOUNTER — Encounter (HOSPITAL_COMMUNITY): Payer: Self-pay

## 2014-05-31 ENCOUNTER — Encounter (HOSPITAL_COMMUNITY): Payer: Self-pay | Admitting: Lab

## 2014-05-31 VITALS — BP 149/78 | HR 72 | Temp 98.1°F | Resp 18 | Wt 146.0 lb

## 2014-05-31 DIAGNOSIS — Z23 Encounter for immunization: Secondary | ICD-10-CM

## 2014-05-31 DIAGNOSIS — C770 Secondary and unspecified malignant neoplasm of lymph nodes of head, face and neck: Secondary | ICD-10-CM

## 2014-05-31 DIAGNOSIS — M810 Age-related osteoporosis without current pathological fracture: Secondary | ICD-10-CM

## 2014-05-31 DIAGNOSIS — C778 Secondary and unspecified malignant neoplasm of lymph nodes of multiple regions: Secondary | ICD-10-CM

## 2014-05-31 DIAGNOSIS — C50912 Malignant neoplasm of unspecified site of left female breast: Secondary | ICD-10-CM

## 2014-05-31 MED ORDER — FLUTICASONE-SALMETEROL 500-50 MCG/DOSE IN AEPB: 1.0000 | INHALATION_SPRAY | Freq: Every day | RESPIRATORY_TRACT | Status: DC

## 2014-05-31 MED ORDER — PNEUMOCOCCAL VAC POLYVALENT 25 MCG/0.5ML IJ INJ
0.5000 mL | INJECTION | INTRAMUSCULAR | Status: DC
  Filled 2014-05-31: qty 0.5

## 2014-05-31 MED ORDER — INFLUENZA VAC SPLIT QUAD 0.5 ML IM SUSY
0.5000 mL | PREFILLED_SYRINGE | Freq: Once | INTRAMUSCULAR | Status: AC
  Administered 2014-05-31: 0.5 mL via INTRAMUSCULAR

## 2014-05-31 MED ORDER — INFLUENZA VAC SPLIT QUAD 0.5 ML IM SUSY
PREFILLED_SYRINGE | INTRAMUSCULAR | Status: AC
  Filled 2014-05-31: qty 0.5

## 2014-05-31 MED ORDER — PNEUMOCOCCAL VAC POLYVALENT 25 MCG/0.5ML IJ INJ
0.5000 mL | INJECTION | Freq: Once | INTRAMUSCULAR | Status: AC
  Administered 2014-05-31: 0.5 mL via INTRAMUSCULAR
  Filled 2014-05-31: qty 0.5

## 2014-05-31 NOTE — Progress Notes (Signed)
[name] presents today for injection per the provider's orders.  Fluvarix and pneumovax 23 administrations without incident; see MAR for injection details.  Patient tolerated procedure well and without incident.  No questions or complaints noted at this time.

## 2014-05-31 NOTE — Progress Notes (Signed)
Referral sent to Dr Donne Hazel at [facility]. On 10/22.  Records faxed by AN

## 2014-05-31 NOTE — Progress Notes (Signed)
[facility]  OFFICE PROGRESS NOTE  [address]ph[address], [place] [address]  DIAGNOSIS: Adenocarcinoma of breast, left - Plan: Consult to general surgery  Metastasis to supraclavicular lymph node - Plan: Consult to general surgery  Osteoporosis  Chief Complaint  Patient presents with  . Breast Cancer    CURRENT THERAPY: Tamoxifen 20 mg daily. Recent increase in CA 27-29 reveal evidence of mediastinal lymph node involvement and possible lymphangitic spread to the lungs.  INTERVAL HISTORY: [name] 63 y.o. female returns for followup of recurrent breast cancer currently taking tamoxifen 20 mg daily since August of 2012 with recurrence occurring while on letrozole in the left cervical regular area, biopsy proven. Last visit on 05/08/2014 revealed elevation in CA 27-29 to 49 with a repeat value on 05/14/2014 of 58. Consequently the patient underwent PET CT scan on 05/25/2014 revealing evidence of hypermetabolic activity in the left supraclavicular lymph nodes as well as mediastinal and right hilar nodes in addition to lung parenchymal uptake perhaps consistent with lymphangitic spread. She does experience cough and shortness of breath on exertion which did not exist previously. She denies a sore mouth, dysphagia, nausea, vomiting, abdominal pain, diarrhea, constipation, melena, hematochezia, hematuria, lower extremity swelling or redness, skin rash, headache, or seizures.    MEDICAL HISTORY: Past Medical History  Diagnosis Date  . Osteoporosis 01/27/2011  . Borderline hypertension   . Pneumonia   . Bladder infection   . Fatigue   . Generalized headaches   . Wears glasses   . Swollen lymph nodes   . Retinal detachment   . Reflux   . URI (upper respiratory infection) 02/17/12  . 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 Breast cancer   . Kidney infection   . Post-mastectomy lymphedema syndrome   .  Closed fracture of humerus sept 2013  . Port catheter in place 04/14/2013    INTERIM HISTORY: has Adenocarcinoma of left breast; Osteoporosis; and Port catheter in place on her problem list.     Adenocarcinoma of left breast   01/27/2011 Initial Diagnosis Adenocarcinoma of left breast  Recurrent breast cancer to left supraclavicular lymph node, biopsy-proven, ER +100%, PR +40%, HER-2/neu nonamplified. This biopsy took place on 03/30/2011 and she has been on tamoxifen since then because of previous failure while on letrozole. 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 Initially treated with dose dense Epirubicin and Cytoxan followed by Docetaxel and Herceptin, the latter for 52 weeks. The docetaxel was given for 4 cycles, epirubicin and Cytoxan for 4 cycles as well. She did receive postoperative/post chemotherapy radiation therapy to the left chest wall, supraclavicular and axillary lymph node areas. She was then started on tamoxifen 20 mg daily because of previous failure on letrozole therapy.   ALLERGIES:  is allergic to ciprofloxacin.  MEDICATIONS: has a current medication list which includes the following prescription(s): amlodipine besylate, calcium-vitamin d, diphenhydramine, escitalopram, folic acid, lidocaine-prilocaine, metoprolol tartrate, nitrofurantoin, tamoxifen, vitamin c, and fluticasone-salmeterol.  SURGICAL HISTORY:  Past Surgical History  Procedure Laterality Date  . Cataract extraction    . Cervical disc fusioni    . Retinal detachment and repair    . Btl    . Mastectomy modified radical    . Port-a-cath removal  in place (not removed)  . Biopsy of lymph node      super clavicle  . Breast surgery      FAMILY HISTORY: family history includes Heart disease in her father and mother; Hypertension  in her mother; Other in her father.  SOCIAL HISTORY:  reports that she has never smoked. She has never used smokeless tobacco. She reports that she drinks about 4.2 ounces of alcohol per week. She reports that she does not use illicit drugs.  REVIEW OF SYSTEMS:  Other than that discussed above is noncontributory.  PHYSICAL EXAMINATION: ECOG PERFORMANCE STATUS: 1 - Symptomatic but completely ambulatory  Blood pressure 149/78, pulse 72, temperature 98.1 F (36.7 C), temperature source Oral, resp. rate 18, weight 146 lb (66.225 kg), SpO2 100.00%.  GENERAL:alert, no distress and comfortable SKIN: skin color, texture, turgor are normal, no rashes or significant lesions EYES: PERLA; Conjunctiva are pink and non-injected, sclera clear SINUSES: No redness or tenderness over maxillary or ethmoid sinuses OROPHARYNX:no exudate, no erythema on lips, buccal mucosa, or tongue. NECK: supple, thyroid normal size, non-tender, without nodularity. No masses CHEST: Normal AP diameter. Status post left mastectomy with telangiectatic changes from radiation. Right breast without mass. Port-A-Cath in place.  LYMPH:  Palpable left subclavicular lymph nodes. LUNGS: clear to auscultation and percussion with normal breathing effort at rest. No rales, rhonchi, or wheezes. HEART: regular rate & rhythm and no murmurs. ABDOMEN:abdomen soft, non-tender and normal bowel sounds MUSCULOSKELETAL:no cyanosis of digits and no clubbing. Range of motion normal.  NEURO: alert & oriented x 3 with fluent speech, no focal motor/sensory deficits   LABORATORY DATA  :Results for MEA, OZGA (MRN [id number]) as of 05/31/2014 09:31  Ref. Range 08/16/2013 15:00 11/08/2013 14:35 05/08/2014 12:58 05/14/2014 12:15  CA 27.29 Latest Range: 0-39 U/mL 26 27 49 (H) 58 (H)       Hospital Outpatient Visit on 05/25/2014  Component Date Value Ref Range Status  . Glucose-Capillary 05/25/2014 99  70 - 99 mg/dL Final  Lab on 05/14/2014    Component Date Value Ref Range Status  . CA 27.29 05/14/2014 58* 0 - 39 U/mL Final   Performed at [facility] on 05/08/2014  Component Date Value Ref Range Status  . WBC 05/08/2014 7.4  4.0 - 10.5 K/uL Final  . RBC 05/08/2014 3.87  3.87 - 5.11 MIL/uL Final  . Hemoglobin 05/08/2014 12.7  12.0 - 15.0 g/dL Final  . HCT 05/08/2014 38.1  36.0 - 46.0 % Final  . MCV 05/08/2014 98.4  78.0 - 100.0 fL Final  . MCH 05/08/2014 32.8  26.0 - 34.0 pg Final  . MCHC 05/08/2014 33.3  30.0 - 36.0 g/dL Final  . RDW 05/08/2014 13.3  11.5 - 15.5 % Final  . Platelets 05/08/2014 257  150 - 400 K/uL Final  . Neutrophils Relative % 05/08/2014 67  43 - 77 % Final  . Neutro Abs 05/08/2014 5.0  1.7 - 7.7 K/uL Final  . Lymphocytes Relative 05/08/2014 24  12 - 46 % Final  . Lymphs Abs 05/08/2014 1.8  0.7 - 4.0 K/uL Final  . Monocytes Relative 05/08/2014 7  3 - 12 % Final  . Monocytes Absolute 05/08/2014 0.5  0.1 - 1.0 K/uL Final  . Eosinophils Relative 05/08/2014 1  0 - 5 % Final  . Eosinophils Absolute 05/08/2014 0.1  0.0 - 0.7 K/uL Final  . Basophils Relative 05/08/2014 1  0 - 1 % Final  . Basophils Absolute 05/08/2014 0.0  0.0 - 0.1 K/uL Final  . Sodium 05/08/2014 137  137 - 147 mEq/L Final  . Potassium 05/08/2014 4.0  3.7 - 5.3 mEq/L Final  . Chloride 05/08/2014 100  96 - 112 mEq/L Final  . CO2 05/08/2014 26  19 - 32 mEq/L Final  . Glucose, Bld 05/08/2014 102* 70 - 99 mg/dL Final  . BUN 05/08/2014 17  6 - 23 mg/dL Final  . Creatinine, Ser 05/08/2014 1.28* 0.50 - 1.10 mg/dL Final  . Calcium 05/08/2014 9.2  8.4 - 10.5 mg/dL Final  . Total Protein 05/08/2014 6.7  6.0 - 8.3 g/dL Final  . Albumin 05/08/2014 3.6  3.5 - 5.2 g/dL Final  . AST 05/08/2014 28  0 - 37 U/L Final  . ALT 05/08/2014 23  0 - 35 U/L Final  . Alkaline Phosphatase 05/08/2014 76  39 - 117 U/L Final  . Total Bilirubin 05/08/2014 0.3  0.3 - 1.2 mg/dL Final  . GFR calc non Af Amer 05/08/2014 44* >90 mL/min Final  . GFR  calc Af Amer 05/08/2014 51* >90 mL/min Final   Comment: (NOTE)                          The eGFR has been calculated using the CKD EPI equation.                          This calculation has not been validated in all clinical situations.                          eGFR's persistently <90 mL/min signify possible Chronic Kidney                          Disease.  . Anion gap 05/08/2014 11  5 - 15 Final  . CEA 05/08/2014 0.5  0.0 - 5.0 ng/mL Final   Performed at [facility]  . CA 27.29 05/08/2014 49* 0 - 39 U/mL Final   Performed at [facility]: Initial tumor was ER/PR positive, HER-2/neu positive. Recurrent disease was ER/PR positive, HER-2/neu negative. Will require additional tumor tissue for analysis now to determine the phenotypic characteristics.  Urinalysis    Component Value Date/Time   COLORURINE YELLOW 08/06/2008 1500   APPEARANCEUR CLEAR 08/06/2008 1500   LABSPEC <1.005* 08/06/2008 1500   PHURINE 6.5 08/06/2008 1500   GLUCOSEU NEGATIVE 08/06/2008 1500   HGBUR SMALL* 08/06/2008 1500   BILIRUBINUR NEGATIVE 08/06/2008 1500   KETONESUR NEGATIVE 08/06/2008 1500   PROTEINUR NEGATIVE 08/06/2008 1500   UROBILINOGEN 0.2 08/06/2008 1500   NITRITE NEGATIVE 08/06/2008 1500   LEUKOCYTESUR NEGATIVE 08/06/2008 1500    RADIOGRAPHIC STUDIES: Nm Pet Image Restag (ps) Skull Base To Thigh  05/25/2014   CLINICAL DATA:  Subsequent treatment strategy for left-sided breast cancer. Elevated Ca27-27. Restaging examination to evaluate for metastatic disease.  EXAM: NUCLEAR MEDICINE PET SKULL BASE TO THIGH  TECHNIQUE: 7.2 mCi F-18 FDG was injected intravenously. Full-ring PET imaging was performed from the skull base to thigh after the radiotracer. CT data was obtained and used for attenuation correction and anatomic localization.  FASTING BLOOD GLUCOSE:  Value: 99 mg/dl  COMPARISON:  PET-CT 03/03/2011.  FINDINGS: NECK  No hypermetabolic lymph nodes in the neck. Diffuse  hypermetabolism in the thyroid gland (SUVmax = 4.4).  CHEST  Extensive hypermetabolic left supraclavicular, bilateral  mediastinal and right hilar lymphadenopathy. Specific examples include a 1.2 cm short axis left supraclavicular lymph node (SUVmax = 3.6), 1.2 cm short axis prevascular lymph node anterior to the proximal superior vena cava (SUVmax = 4.5), low right paratracheal lymph node measuring 9 mm in short axis (SUVmax = 5.1), and right hilar lymph node which is difficult to discretely measure secondary to lack of IV contrast, estimated to measure 12 mm in short axis (SUVmax = 4.1). No other internal mammary or axillary lymph nodes are noted. Status post left modified radical mastectomy with left axillary nodal dissection. Right subclavian single-lumen porta cath with tip terminating at the superior cavoatrial junction. Deep to the left mastectomy site in the periphery of the left upper lobe there is a pleural based 1.5 x 0.8 cm nodular opacity with surrounding architectural distortion which appears unchanged compared to the prior study, and demonstrates no hypermetabolism, most compatible with a focal area of postradiation fibrosis. Mild diffuse interlobular septal thickening with some patchy areas of ground-glass attenuation in the lungs bilaterally, particularly in the lung bases, favored to reflect some very mild interstitial pulmonary edema.  ABDOMEN/PELVIS  No abnormal hypermetabolic activity within the liver, pancreas, adrenal glands, or spleen. No hypermetabolic lymph nodes in the abdomen or pelvis.  SKELETON  No focal hypermetabolic activity to suggest skeletal metastasis.  IMPRESSION: 1. Multiple borderline enlarged and minimally enlarged hypermetabolic bilateral mediastinal, right hilar and left supraclavicular lymph nodes. While this could certainly represent metastatic disease from primary breast cancer, this could alternatively reflect a lymphoproliferative disorder such as lymphoma. Clinical  correlation is recommended. 2. Mild diffuse hypermetabolism in the thyroid gland. No dominant lesion is identified. This could suggest thyroiditis, correlation with thyroid function tests. 3. Although there is a peripheral nodule in the left lung, this has imaging characteristics compatible with an area of benign postradiation fibrosis, as discussed above. No suspicious pulmonary nodules are noted. 4. The appearance of the lungs does suggest pulmonary edema. Given the relatively normal cardiac size, this could be noncardiogenic, potentially related to lymphatic congestion given the extensive lymphadenopathy discussed above.   Electronically Signed   By: Vinnie Langton M.D.   On: 05/25/2014 15:14    ASSESSMENT:  #1. Progressive breast cancer with left supraclavicular and mediastinal, hilar, and lymphangitic spread in the lungs. Characteristics of recurrence to be determined by repeat supraclavicular lymph node biopsy. She was told to stop tamoxifen. #2. Osteopenia on yearly zoledronic acid.   PLAN:  #1. Advair 50/500 daily. #2. Refer to Dr. Donne Hazel for left supraclavicular lymph node biopsy for ER, PR, HER-2/neu, and molecular analysis by [facility]. #3. Influenza virus vaccine and pneumococcal vaccine were given today. #4. Consider My Risk testing to determine potential use of Olaparib or platinum combinations and management if BRCA1 or BRCA2 is present. #4. Followup in 3 weeks.   All questions were answered. The patient knows to call the clinic with any problems, questions or concerns. We can certainly see the patient much sooner if necessary.   I spent 30 minutes counseling the patient face to face. The total time spent in the appointment was 40 minutes.    Doroteo Bradford, MD 05/31/2014 10:25 AM  DISCLAIMER:  This note was dictated with voice recognition software.  Similar sounding words can inadvertently be transcribed inaccurately and may not be corrected upon review.

## 2014-05-31 NOTE — Patient Instructions (Signed)
[facility] Discharge Instructions  RECOMMENDATIONS MADE BY THE CONSULTANT AND ANY TEST RESULTS WILL BE SENT TO YOUR REFERRING PHYSICIAN.  Stop taking tamoxifen Referral made to Dr. Donne Hazel for biopsy; his office will call you with appointment date and time Return here in 3 weeks for office visit to discuss biopsy results  Thank you for choosing [facility] to provide your oncology and hematology care.  To afford each patient quality time with our providers, please arrive at least 15 minutes before your scheduled appointment time.  With your help, our goal is to use those 15 minutes to complete the necessary work-up to ensure our physicians have the information they need to help with your evaluation and healthcare recommendations.    Effective January 1st, 2014, we ask that you re-schedule your appointment with our physicians should you arrive 10 or more minutes late for your appointment.  We strive to give you quality time with our providers, and arriving late affects you and other patients whose appointments are after yours.    Again, thank you for choosing [facility].  Our hope is that these requests will decrease the amount of time that you wait before being seen by our physicians.       _____________________________________________________________  Should you have questions after your visit to [facility], please contact our office at [phone] between the hours of 8:30 a.m. and 4:30 p.m.  Voicemails left after 4:30 p.m. will not be returned until the following business day.  For prescription refill requests, have your pharmacy contact our office with your prescription refill request.    _______________________________________________________________  We hope that we have given you very good care.  You may receive a patient satisfaction survey in the mail, please complete it and return it as soon as possible.  We value your  feedback!  _______________________________________________________________  Have you asked about our STAR program?  STAR stands for Survivorship Training and Rehabilitation, and this is a nationally recognized cancer care program that focuses on survivorship and rehabilitation.  Cancer and cancer treatments may cause problems, such as, pain, making you feel tired and keeping you from doing the things that you need or want to do. Cancer rehabilitation can help. Our goal is to reduce these troubling effects and help you have the best quality of life possible.  You may receive a survey from a nurse that asks questions about your current state of health.  Based on the survey results, all eligible patients will be referred to the [facility] program for an evaluation so we can better serve you!  A frequently asked questions sheet is available upon request.

## 2014-06-05 ENCOUNTER — Telehealth (INDEPENDENT_AMBULATORY_CARE_PROVIDER_SITE_OTHER): Payer: Self-pay

## 2014-06-05 DIAGNOSIS — R222 Localized swelling, mass and lump, trunk: Secondary | ICD-10-CM

## 2014-06-05 NOTE — Telephone Encounter (Signed)
Pt seen in office today by Dr Donne Hazel and order placed in epic for u/s guided bx to be done in IR to eval. Left supraclavicular node. Dr Donne Hazel spoke to Dr Salvatore Marvel in IR today.

## 2014-06-08 ENCOUNTER — Other Ambulatory Visit: Payer: Self-pay | Admitting: Radiology

## 2014-06-11 ENCOUNTER — Other Ambulatory Visit: Payer: Self-pay | Admitting: Radiology

## 2014-06-12 ENCOUNTER — Other Ambulatory Visit: Payer: Self-pay | Admitting: Radiology

## 2014-06-13 ENCOUNTER — Ambulatory Visit (HOSPITAL_COMMUNITY)
Admission: RE | Admit: 2014-06-13 | Discharge: 2014-06-13 | Disposition: A | Discharge status: Home / Self Care | Payer: BLUE CROSS/BLUE SHIELD | Source: Ambulatory Visit | Attending: General Surgery | Admitting: General Surgery

## 2014-06-13 ENCOUNTER — Encounter (HOSPITAL_COMMUNITY): Payer: Self-pay

## 2014-06-13 DIAGNOSIS — R222 Localized swelling, mass and lump, trunk: Secondary | ICD-10-CM

## 2014-06-13 MED ORDER — MIDAZOLAM HCL 2 MG/2ML IJ SOLN
INTRAMUSCULAR | Status: AC | PRN
  Administered 2014-06-13: 1 mg via INTRAVENOUS

## 2014-06-13 MED ORDER — SODIUM CHLORIDE 0.9 % IV SOLN: Freq: Once | INTRAVENOUS | Status: DC

## 2014-06-13 MED ORDER — FENTANYL CITRATE 0.05 MG/ML IJ SOLN
INTRAMUSCULAR | Status: AC | PRN
  Administered 2014-06-13: 50 ug via INTRAVENOUS

## 2014-06-13 NOTE — H&P (Signed)
Chief Complaint: Hx breast cancer Recent increase in CA 27-29 +PET  Referring Physician(s): Wakefield,Matthew  History of Present Illness: [name] is a 63 y.o. female  Pt with hx breast ca Blood work CA 27-29 elevation +PET 05/25/2014: L supraclavicular lymph node; mediastinal and hilar nodes Now scheduled for L [facility] LN bx  Past Medical History  Diagnosis Date  . Osteoporosis 01/27/2011  . Borderline hypertension   . Pneumonia   . Bladder infection   . Fatigue   . Generalized headaches   . Wears glasses   . Swollen lymph nodes   . Retinal detachment   . Reflux   . URI (upper respiratory infection) 02/17/12  . 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 Breast cancer   . Kidney infection   . Post-mastectomy lymphedema syndrome   . Closed fracture of humerus sept 2013  . Port catheter in place 04/14/2013    Past Surgical History  Procedure Laterality Date  . Cataract extraction    . Cervical disc fusioni    . Retinal detachment and repair    . Btl    . Mastectomy modified radical    . Port-a-cath removal      in place (not removed)  . Biopsy of lymph node      super clavicle  . Breast surgery      Allergies: Ciprofloxacin  Medications: Prior to Admission medications   Medication Sig Start Date End Date Taking? Authorizing Provider  AMLODIPINE BESYLATE PO Take 5 mg by mouth daily after supper.    Historical Provider, MD  calcium-vitamin D (OSCAL WITH D) 500-200 MG-UNIT per tablet Take 2 tablets by mouth daily.     Historical Provider, MD  diphenhydrAMINE (SOMINEX) 25 MG tablet Take 25 mg by mouth at bedtime as needed. To help with sleeping    Historical Provider, MD  escitalopram (LEXAPRO) 10 MG tablet Take 1 tablet (10 mg total) by mouth daily. 01/16/14   Baird Cancer, PA-C  Fluticasone-Salmeterol (ADVAIR DISKUS) 500-50 MCG/DOSE AEPB Inhale 1 puff into the lungs daily. 05/31/14   Farrel Gobble, MD  folic acid (FOLVITE) 782  MCG tablet Take 400 mcg by mouth daily.      Historical Provider, MD  lidocaine-prilocaine (EMLA) cream Dispense 1 tube use as directed 03/23/14   Farrel Gobble, MD  metoprolol tartrate (LOPRESSOR) 25 MG tablet Take 25 mg by mouth daily.    Historical Provider, MD  nitrofurantoin (MACRODANTIN) 100 MG capsule Take 100 mg by mouth 2 (two) times daily.    Historical Provider, MD  tamoxifen (NOLVADEX) 20 MG tablet Take 1 tablet (20 mg total) by mouth daily. 05/18/14   Baird Cancer, PA-C  vitamin C (ASCORBIC ACID) 500 MG tablet Take 500 mg by mouth daily.    Historical Provider, MD    Family History  Problem Relation Age of Onset  . Hypertension Mother   . Heart disease Mother     atrial fib  . Heart disease Father   . Other Father     glaucoma    History   Social History  . Marital Status: Divorced    Spouse Name: N/A    Number of Children: N/A  . Years of Education: N/A   Social History Main Topics  . Smoking status: Never Smoker   . Smokeless tobacco: Never Used  . Alcohol Use: 4.2 oz/week    7 Glasses of wine per week  . Drug Use: No  .  Sexual Activity: None   Other Topics Concern  . None   Social History Narrative    Review of Systems: A 12 point ROS discussed and pertinent positives are indicated in the HPI above.  All other systems are negative.  Review of Systems  Constitutional: Positive for fatigue. Negative for activity change, appetite change and unexpected weight change.  Respiratory: Positive for cough and shortness of breath. Negative for wheezing.   Cardiovascular: Negative for chest pain.  Gastrointestinal: Negative for abdominal pain.  Genitourinary: Negative for difficulty urinating.  Musculoskeletal: Negative for back pain and neck stiffness.  Neurological: Positive for weakness. Negative for dizziness.  Psychiatric/Behavioral: Negative for behavioral problems and confusion.     Vital Signs: BP 121/66 mmHg  Pulse 71  Temp(Src) 98.4 F (36.9  C) (Oral)  Resp 18  Ht 5\' 4"  (1.626 m)  Wt 65.318 kg (144 lb)  BMI 24.71 kg/m2  SpO2 99%  Physical Exam  Constitutional: She is oriented to person, place, and time. She appears well-nourished.  Cardiovascular: Normal rate, regular rhythm and normal heart sounds.   No murmur heard. Pulmonary/Chest: Effort normal and breath sounds normal. She has no wheezes.  Abdominal: Soft. Bowel sounds are normal. There is no tenderness.  Musculoskeletal: Normal range of motion.  Neurological: She is alert and oriented to person, place, and time.  Skin: Skin is warm.  Psychiatric: She has a normal mood and affect. Her behavior is normal. Judgment and thought content normal.    Imaging: Nm Pet Image Restag (ps) Skull Base To Thigh  05/25/2014   CLINICAL DATA:  Subsequent treatment strategy for left-sided breast cancer. Elevated Ca27-27. Restaging examination to evaluate for metastatic disease.  EXAM: NUCLEAR MEDICINE PET SKULL BASE TO THIGH  TECHNIQUE: 7.2 mCi F-18 FDG was injected intravenously. Full-ring PET imaging was performed from the skull base to thigh after the radiotracer. CT data was obtained and used for attenuation correction and anatomic localization.  FASTING BLOOD GLUCOSE:  Value: 99 mg/dl  COMPARISON:  PET-CT 03/03/2011.  FINDINGS: NECK  No hypermetabolic lymph nodes in the neck. Diffuse hypermetabolism in the thyroid gland (SUVmax = 4.4).  CHEST  Extensive hypermetabolic left supraclavicular, bilateral mediastinal and right hilar lymphadenopathy. Specific examples include a 1.2 cm short axis left supraclavicular lymph node (SUVmax = 3.6), 1.2 cm short axis prevascular lymph node anterior to the proximal superior vena cava (SUVmax = 4.5), low right paratracheal lymph node measuring 9 mm in short axis (SUVmax = 5.1), and right hilar lymph node which is difficult to discretely measure secondary to lack of IV contrast, estimated to measure 12 mm in short axis (SUVmax = 4.1). No other internal  mammary or axillary lymph nodes are noted. Status post left modified radical mastectomy with left axillary nodal dissection. Right subclavian single-lumen porta cath with tip terminating at the superior cavoatrial junction. Deep to the left mastectomy site in the periphery of the left upper lobe there is a pleural based 1.5 x 0.8 cm nodular opacity with surrounding architectural distortion which appears unchanged compared to the prior study, and demonstrates no hypermetabolism, most compatible with a focal area of postradiation fibrosis. Mild diffuse interlobular septal thickening with some patchy areas of ground-glass attenuation in the lungs bilaterally, particularly in the lung bases, favored to reflect some very mild interstitial pulmonary edema.  ABDOMEN/PELVIS  No abnormal hypermetabolic activity within the liver, pancreas, adrenal glands, or spleen. No hypermetabolic lymph nodes in the abdomen or pelvis.  SKELETON  No focal hypermetabolic activity to  suggest skeletal metastasis.  IMPRESSION: 1. Multiple borderline enlarged and minimally enlarged hypermetabolic bilateral mediastinal, right hilar and left supraclavicular lymph nodes. While this could certainly represent metastatic disease from primary breast cancer, this could alternatively reflect a lymphoproliferative disorder such as lymphoma. Clinical correlation is recommended. 2. Mild diffuse hypermetabolism in the thyroid gland. No dominant lesion is identified. This could suggest thyroiditis, correlation with thyroid function tests. 3. Although there is a peripheral nodule in the left lung, this has imaging characteristics compatible with an area of benign postradiation fibrosis, as discussed above. No suspicious pulmonary nodules are noted. 4. The appearance of the lungs does suggest pulmonary edema. Given the relatively normal cardiac size, this could be noncardiogenic, potentially related to lymphatic congestion given the extensive lymphadenopathy  discussed above.   Electronically Signed   By: Vinnie Langton M.D.   On: 05/25/2014 15:14    Labs:  CBC:  Recent Labs  08/16/13 1500 11/08/13 1435 05/08/14 1258  WBC 9.0 7.5 7.4  HGB 13.3 12.8 12.7  HCT 40.2 38.4 38.1  PLT 269 224 257    COAGS: No results for input(s): INR, APTT in the last 8760 hours.  BMP:  Recent Labs  08/16/13 1500 11/08/13 1435 05/08/14 1258  NA 140 139 137  K 4.0 3.8 4.0  CL 103 101 100  CO2 24 27 26   GLUCOSE 88 151* 102*  BUN 20 20 17   CALCIUM 9.4 9.3 9.2  CREATININE 0.78 0.97 1.28*  GFRNONAA 89* 62* 44*  GFRAA >90 72* 51*    LIVER FUNCTION TESTS:  Recent Labs  08/16/13 1500 11/08/13 1435 05/08/14 1258  BILITOT <0.2* 0.3 0.3  AST 31 32 28  ALT 25 23 23   ALKPHOS 70 64 76  PROT 7.1 6.8 6.7  ALBUMIN 3.8 3.7 3.6    TUMOR MARKERS:  Recent Labs  05/08/14 1258  CEA 0.5    Assessment and Plan:  Hx breast cancer Elevation of CA 27-29 +PET 05/25/2014: [place] LAN; mediastinal and hilar LAN Now scheduled for L  LN biopsy Pt aware of procedure benefits and risks and agreeable to proceed Consent signed andin chart  Thank you for this interesting consult.  I greatly enjoyed meeting [name] and look forward to participating in their care.    I spent a total of 20 minutes face to face in clinical consultation, greater than 50% of which was counseling/coordinating care for L supraclavicular lymph node biopsy  Signed: Zebediah Beezley A 06/13/2014, 2:10 PM

## 2014-06-13 NOTE — Procedures (Signed)
Procedure:  Ultrasound guided core biopsy of left supraclavicular LN Findings:  18 G core biopsy x 4 of left supraclavicular LN.

## 2014-06-13 NOTE — Discharge Instructions (Signed)
Needle Biopsy °Care After °These instructions give you information on caring for yourself after your procedure. Your doctor may also give you more specific instructions. Call your doctor if you have any problems or questions after your procedure. °HOME CARE °· Rest for 4 hours after your biopsy, except for getting up to go to the bathroom or as told. °· Keep the places where the needles were put in clean and dry. °¨ Do not put powder or lotion on the sites. °¨ Do not shower until 24 hours after the test. Remove all bandages (dressings) before showering. °¨ Remove all bandages at least once every day. Gently clean the sites with soap and water. Keep putting a new bandage on until the skin is closed. °Finding out the results of your test °Ask your doctor when your test results will be ready. Make sure you follow up and get the test results. °GET HELP RIGHT AWAY IF:  °· You have shortness of breath or trouble breathing. °· You have pain or cramping in your belly (abdomen). °· You feel sick to your stomach (nauseous) or throw up (vomit). °· Any of the places where the needles were put in: °¨ Are puffy (swollen) or red. °¨ Are sore or hot to the touch. °¨ Are draining yellowish-white fluid (pus). °¨ Are bleeding after 10 minutes of pressing down on the site. Have someone keep pressing on any place that is bleeding until you see a doctor. °· You have any unusual pain that will not stop. °· You have a fever. °If you go to the emergency room, tell the nurse that you had a biopsy. Take this paper with you to show the nurse. °MAKE SURE YOU:  °· Understand these instructions. °· Will watch your condition. °· Will get help right away if you are not doing well or get worse. °Document Released: 07/09/2008 Document Revised: 10/19/2011 Document Reviewed: 07/09/2008 °ExitCare® Patient Information ©2015 [employer]. This information is not intended to replace advice given to you by your health care provider. Make sure you discuss  any questions you have with your health care provider. ° °

## 2014-06-15 ENCOUNTER — Telehealth (HOSPITAL_COMMUNITY): Payer: Self-pay | Admitting: *Deleted

## 2014-06-15 NOTE — Telephone Encounter (Signed)
Pathology has already sent tissue for ER PR HER2 testing and pathology representative said that if there was enough tissue after testing that they would send to [facility] for testing.

## 2014-06-19 ENCOUNTER — Encounter (HOSPITAL_COMMUNITY): Payer: BC Managed Care – PPO

## 2014-06-21 ENCOUNTER — Encounter (HOSPITAL_COMMUNITY): Payer: BC Managed Care – PPO

## 2014-06-21 ENCOUNTER — Ambulatory Visit (HOSPITAL_COMMUNITY): Payer: BC Managed Care – PPO

## 2014-06-21 ENCOUNTER — Other Ambulatory Visit (HOSPITAL_COMMUNITY): Payer: Self-pay | Admitting: Hematology and Oncology

## 2014-06-21 MED ORDER — PALBOCICLIB 100 MG PO CAPS: ORAL_CAPSULE | ORAL | Status: DC

## 2014-07-16 ENCOUNTER — Other Ambulatory Visit (HOSPITAL_COMMUNITY): Payer: Self-pay | Admitting: Oncology

## 2014-07-16 DIAGNOSIS — F32A Depression, unspecified: Secondary | ICD-10-CM

## 2014-07-16 DIAGNOSIS — F329 Major depressive disorder, single episode, unspecified: Secondary | ICD-10-CM

## 2014-07-16 MED ORDER — ESCITALOPRAM OXALATE 10 MG PO TABS: 10.0000 mg | ORAL_TABLET | Freq: Every day | ORAL | Status: DC

## 2014-07-31 ENCOUNTER — Other Ambulatory Visit (HOSPITAL_COMMUNITY): Payer: Self-pay | Admitting: Oncology

## 2014-07-31 ENCOUNTER — Encounter (HOSPITAL_COMMUNITY): Payer: BC Managed Care – PPO

## 2014-07-31 DIAGNOSIS — C50912 Malignant neoplasm of unspecified site of left female breast: Secondary | ICD-10-CM

## 2014-09-10 ENCOUNTER — Ambulatory Visit (HOSPITAL_COMMUNITY)
Admission: RE | Admit: 2014-09-10 | Discharge: 2014-09-10 | Disposition: A | Discharge status: Home / Self Care | Payer: BLUE CROSS/BLUE SHIELD | Source: Ambulatory Visit | Attending: Oncology | Admitting: Oncology

## 2014-09-10 DIAGNOSIS — C50912 Malignant neoplasm of unspecified site of left female breast: Secondary | ICD-10-CM | POA: Insufficient documentation

## 2014-09-10 LAB — GLUCOSE, CAPILLARY: Glucose-Capillary: 94 mg/dL (ref 70–99)

## 2014-09-10 MED ORDER — FLUDEOXYGLUCOSE F - 18 (FDG) INJECTION
7.8000 | Freq: Once | INTRAVENOUS | Status: AC | PRN
  Administered 2014-09-10: 7.8 via INTRAVENOUS

## 2014-09-11 ENCOUNTER — Encounter (HOSPITAL_COMMUNITY): Payer: BC Managed Care – PPO

## 2014-10-23 ENCOUNTER — Ambulatory Visit (HOSPITAL_COMMUNITY): Payer: BC Managed Care – PPO

## 2014-10-23 ENCOUNTER — Encounter (HOSPITAL_COMMUNITY): Payer: BC Managed Care – PPO

## 2015-01-18 ENCOUNTER — Other Ambulatory Visit (HOSPITAL_COMMUNITY): Payer: Self-pay | Admitting: Oncology

## 2015-02-26 ENCOUNTER — Other Ambulatory Visit (HOSPITAL_COMMUNITY): Payer: Self-pay | Admitting: Oncology

## 2015-02-26 DIAGNOSIS — C50912 Malignant neoplasm of unspecified site of left female breast: Secondary | ICD-10-CM

## 2015-04-25 ENCOUNTER — Ambulatory Visit (HOSPITAL_COMMUNITY)
Admission: RE | Admit: 2015-04-25 | Discharge: 2015-04-25 | Disposition: A | Discharge status: Home / Self Care | Payer: BLUE CROSS/BLUE SHIELD | Source: Ambulatory Visit | Attending: Oncology | Admitting: Oncology

## 2015-04-25 DIAGNOSIS — C50912 Malignant neoplasm of unspecified site of left female breast: Secondary | ICD-10-CM | POA: Insufficient documentation

## 2015-04-25 DIAGNOSIS — C7951 Secondary malignant neoplasm of bone: Secondary | ICD-10-CM | POA: Insufficient documentation

## 2015-04-25 DIAGNOSIS — R918 Other nonspecific abnormal finding of lung field: Secondary | ICD-10-CM | POA: Insufficient documentation

## 2015-04-25 LAB — GLUCOSE, CAPILLARY: Glucose-Capillary: 96 mg/dL (ref 65–99)

## 2015-04-25 MED ORDER — FLUDEOXYGLUCOSE F - 18 (FDG) INJECTION
7.0100 | Freq: Once | INTRAVENOUS | Status: DC | PRN
  Administered 2015-04-25: 7.01 via INTRAVENOUS
  Filled 2015-04-25: qty 7.01

## 2015-06-13 ENCOUNTER — Encounter (HOSPITAL_BASED_OUTPATIENT_CLINIC_OR_DEPARTMENT_OTHER): Payer: Self-pay | Admitting: *Deleted

## 2015-06-17 ENCOUNTER — Other Ambulatory Visit: Payer: Self-pay | Admitting: General Surgery

## 2015-06-17 ENCOUNTER — Encounter (HOSPITAL_BASED_OUTPATIENT_CLINIC_OR_DEPARTMENT_OTHER)
Admission: RE | Admit: 2015-06-17 | Discharge: 2015-06-17 | Disposition: A | Discharge status: Home / Self Care | Payer: BLUE CROSS/BLUE SHIELD | Source: Ambulatory Visit | Attending: General Surgery | Admitting: General Surgery

## 2015-06-17 DIAGNOSIS — I972 Postmastectomy lymphedema syndrome: Secondary | ICD-10-CM | POA: Insufficient documentation

## 2015-06-17 DIAGNOSIS — Z881 Allergy status to other antibiotic agents status: Secondary | ICD-10-CM | POA: Insufficient documentation

## 2015-06-17 DIAGNOSIS — C50919 Malignant neoplasm of unspecified site of unspecified female breast: Secondary | ICD-10-CM | POA: Diagnosis present

## 2015-06-17 DIAGNOSIS — M810 Age-related osteoporosis without current pathological fracture: Secondary | ICD-10-CM | POA: Insufficient documentation

## 2015-06-17 DIAGNOSIS — Z9012 Acquired absence of left breast and nipple: Secondary | ICD-10-CM | POA: Diagnosis not present

## 2015-06-17 DIAGNOSIS — K219 Gastro-esophageal reflux disease without esophagitis: Secondary | ICD-10-CM | POA: Insufficient documentation

## 2015-06-17 DIAGNOSIS — C770 Secondary and unspecified malignant neoplasm of lymph nodes of head, face and neck: Secondary | ICD-10-CM | POA: Diagnosis not present

## 2015-06-17 DIAGNOSIS — Z853 Personal history of malignant neoplasm of breast: Secondary | ICD-10-CM | POA: Diagnosis not present

## 2015-06-17 DIAGNOSIS — Z79899 Other long term (current) drug therapy: Secondary | ICD-10-CM | POA: Diagnosis not present

## 2015-06-17 LAB — PROTIME-INR
INR: 1 (ref 0.00–1.49)
Prothrombin Time: 13.4 seconds (ref 11.6–15.2)

## 2015-06-17 LAB — BASIC METABOLIC PANEL
Anion gap: 10 (ref 5–15)
BUN: 12 mg/dL (ref 6–20)
CO2: 27 mmol/L (ref 22–32)
Calcium: 9.7 mg/dL (ref 8.9–10.3)
Chloride: 100 mmol/L — ABNORMAL LOW (ref 101–111)
Creatinine, Ser: 0.75 mg/dL (ref 0.44–1.00)
GFR calc Af Amer: >60 mL/min (ref >60)
GFR calc non Af Amer: >60 mL/min (ref >60)
Glucose, Bld: 99 mg/dL (ref 65–99)
Potassium: 4.9 mmol/L (ref 3.5–5.1)
Sodium: 137 mmol/L (ref 135–145)

## 2015-06-17 LAB — CBC WITH DIFFERENTIAL/PLATELET
Basophils Absolute: 0 10*3/uL (ref 0.0–0.1)
Basophils Relative: 0 %
Eosinophils Absolute: 0 10*3/uL (ref 0.0–0.7)
Eosinophils Relative: 1 %
HCT: 41 % (ref 36.0–46.0)
Hemoglobin: 13.6 g/dL (ref 12.0–15.0)
Lymphocytes Relative: 24 %
Lymphs Abs: 2 10*3/uL (ref 0.7–4.0)
MCH: 34.2 pg — ABNORMAL HIGH (ref 26.0–34.0)
MCHC: 33.2 g/dL (ref 30.0–36.0)
MCV: 103 fL — ABNORMAL HIGH (ref 78.0–100.0)
Monocytes Absolute: 0.8 10*3/uL (ref 0.1–1.0)
Monocytes Relative: 10 %
Neutro Abs: 5.4 10*3/uL (ref 1.7–7.7)
Neutrophils Relative %: 65 %
Platelets: 195 10*3/uL (ref 150–400)
RBC: 3.98 MIL/uL (ref 3.87–5.11)
RDW: 13.9 % (ref 11.5–15.5)
WBC: 8.2 10*3/uL (ref 4.0–10.5)

## 2015-06-18 NOTE — Progress Notes (Signed)
Late entry for 06/17/2015 - Dr. Lauretta Grill reviewed [place] for surgery

## 2015-06-19 ENCOUNTER — Ambulatory Visit (HOSPITAL_BASED_OUTPATIENT_CLINIC_OR_DEPARTMENT_OTHER): Payer: BLUE CROSS/BLUE SHIELD | Admitting: Anesthesiology

## 2015-06-19 ENCOUNTER — Ambulatory Visit (HOSPITAL_BASED_OUTPATIENT_CLINIC_OR_DEPARTMENT_OTHER)
Admission: RE | Admit: 2015-06-19 | Discharge: 2015-06-19 | Disposition: A | Discharge status: Home Health | Payer: BLUE CROSS/BLUE SHIELD | Source: Ambulatory Visit | Attending: General Surgery | Admitting: General Surgery

## 2015-06-19 ENCOUNTER — Encounter (HOSPITAL_BASED_OUTPATIENT_CLINIC_OR_DEPARTMENT_OTHER): Payer: Self-pay | Admitting: Anesthesiology

## 2015-06-19 ENCOUNTER — Encounter (HOSPITAL_BASED_OUTPATIENT_CLINIC_OR_DEPARTMENT_OTHER)
Admission: RE | Disposition: A | Discharge status: Home Health | Payer: Self-pay | Source: Ambulatory Visit | Attending: General Surgery

## 2015-06-19 DIAGNOSIS — C770 Secondary and unspecified malignant neoplasm of lymph nodes of head, face and neck: Secondary | ICD-10-CM | POA: Diagnosis not present

## 2015-06-19 HISTORY — PX: LYMPH NODE BIOPSY: SHX201

## 2015-06-19 SURGERY — LYMPH NODE BIOPSY
Anesthesia: General | Site: Neck | Laterality: Right

## 2015-06-19 MED ORDER — ONDANSETRON HCL 4 MG/2ML IJ SOLN: 4.0000 mg | Freq: Four times a day (QID) | INTRAMUSCULAR | Status: DC | PRN

## 2015-06-19 MED ORDER — PROPOFOL 10 MG/ML IV BOLUS
INTRAVENOUS | Status: DC | PRN
  Administered 2015-06-19: 200 mg via INTRAVENOUS

## 2015-06-19 MED ORDER — DEXAMETHASONE SODIUM PHOSPHATE 10 MG/ML IJ SOLN
INTRAMUSCULAR | Status: AC
  Filled 2015-06-19: qty 1

## 2015-06-19 MED ORDER — PROPOFOL 10 MG/ML IV BOLUS
INTRAVENOUS | Status: AC
  Filled 2015-06-19: qty 20

## 2015-06-19 MED ORDER — OXYCODONE HCL 5 MG PO TABS: 5.0000 mg | ORAL_TABLET | Freq: Once | ORAL | Status: DC | PRN

## 2015-06-19 MED ORDER — CEFAZOLIN SODIUM-DEXTROSE 2-3 GM-% IV SOLR
INTRAVENOUS | Status: AC
  Filled 2015-06-19: qty 50

## 2015-06-19 MED ORDER — LIDOCAINE HCL (CARDIAC) 20 MG/ML IV SOLN
INTRAVENOUS | Status: DC | PRN
  Administered 2015-06-19: 50 mg via INTRAVENOUS

## 2015-06-19 MED ORDER — LACTATED RINGERS IV SOLN
INTRAVENOUS | Status: DC
  Administered 2015-06-19: 12:00:00 via INTRAVENOUS

## 2015-06-19 MED ORDER — FENTANYL CITRATE (PF) 100 MCG/2ML IJ SOLN
INTRAMUSCULAR | Status: AC
  Filled 2015-06-19: qty 4

## 2015-06-19 MED ORDER — SCOPOLAMINE 1 MG/3DAYS TD PT72: 1.0000 | MEDICATED_PATCH | Freq: Once | TRANSDERMAL | Status: DC | PRN

## 2015-06-19 MED ORDER — HYDROCODONE-ACETAMINOPHEN 5-325 MG PO TABS
1.0000 | ORAL_TABLET | Freq: Once | ORAL | Status: AC
  Administered 2015-06-19: 1 via ORAL

## 2015-06-19 MED ORDER — FENTANYL CITRATE (PF) 100 MCG/2ML IJ SOLN
50.0000 ug | INTRAMUSCULAR | Status: DC | PRN
  Administered 2015-06-19: 100 ug via INTRAVENOUS

## 2015-06-19 MED ORDER — MIDAZOLAM HCL 2 MG/2ML IJ SOLN
1.0000 mg | INTRAMUSCULAR | Status: DC | PRN
  Administered 2015-06-19: 2 mg via INTRAVENOUS

## 2015-06-19 MED ORDER — ONDANSETRON HCL 4 MG/2ML IJ SOLN
INTRAMUSCULAR | Status: DC | PRN
  Administered 2015-06-19: 4 mg via INTRAVENOUS

## 2015-06-19 MED ORDER — LIDOCAINE HCL (CARDIAC) 20 MG/ML IV SOLN
INTRAVENOUS | Status: AC
  Filled 2015-06-19: qty 5

## 2015-06-19 MED ORDER — CEFAZOLIN SODIUM-DEXTROSE 2-3 GM-% IV SOLR
2.0000 g | INTRAVENOUS | Status: AC
  Administered 2015-06-19: 2 g via INTRAVENOUS

## 2015-06-19 MED ORDER — OXYCODONE HCL 5 MG/5ML PO SOLN: 5.0000 mg | Freq: Once | ORAL | Status: DC | PRN

## 2015-06-19 MED ORDER — ONDANSETRON HCL 4 MG/2ML IJ SOLN
INTRAMUSCULAR | Status: AC
  Filled 2015-06-19: qty 2

## 2015-06-19 MED ORDER — DEXAMETHASONE SODIUM PHOSPHATE 4 MG/ML IJ SOLN
INTRAMUSCULAR | Status: DC | PRN
  Administered 2015-06-19: 10 mg via INTRAVENOUS

## 2015-06-19 MED ORDER — HYDROCODONE-ACETAMINOPHEN 10-325 MG PO TABS: 1.0000 | ORAL_TABLET | Freq: Four times a day (QID) | ORAL | Status: DC | PRN

## 2015-06-19 MED ORDER — GLYCOPYRROLATE 0.2 MG/ML IJ SOLN: 0.2000 mg | Freq: Once | INTRAMUSCULAR | Status: DC | PRN

## 2015-06-19 MED ORDER — FENTANYL CITRATE (PF) 100 MCG/2ML IJ SOLN: 25.0000 ug | INTRAMUSCULAR | Status: DC | PRN

## 2015-06-19 MED ORDER — MIDAZOLAM HCL 2 MG/2ML IJ SOLN
INTRAMUSCULAR | Status: AC
  Filled 2015-06-19: qty 4

## 2015-06-19 MED ORDER — BUPIVACAINE HCL (PF) 0.25 % IJ SOLN
INTRAMUSCULAR | Status: DC | PRN
  Administered 2015-06-19: 7 mL

## 2015-06-19 MED ORDER — HYDROCODONE-ACETAMINOPHEN 5-325 MG PO TABS
ORAL_TABLET | ORAL | Status: AC
  Filled 2015-06-19: qty 1

## 2015-06-19 SURGICAL SUPPLY — 52 items
APL SKNCLS STERI-STRIP NONHPOA (GAUZE/BANDAGES/DRESSINGS) ×2
APPLIER CLIP 9.375 MED OPEN (MISCELLANEOUS)
APR CLP MED 9.3 20 MLT OPN (MISCELLANEOUS)
BENZOIN TINCTURE PRP APPL 2/3 (GAUZE/BANDAGES/DRESSINGS) ×3 IMPLANT
BLADE SURG 15 STRL LF DISP TIS (BLADE) ×2 IMPLANT
BLADE SURG 15 STRL SS (BLADE) ×3
BNDG COHESIVE 4X5 TAN STRL (GAUZE/BANDAGES/DRESSINGS) IMPLANT
CANISTER SUCT 1200ML W/VALVE (MISCELLANEOUS) IMPLANT
CHLORAPREP W/TINT 26ML (MISCELLANEOUS) ×3 IMPLANT
CLIP APPLIE 9.375 MED OPEN (MISCELLANEOUS) IMPLANT
COVER BACK TABLE 60X90IN (DRAPES) ×3 IMPLANT
COVER MAYO STAND STRL (DRAPES) ×1 IMPLANT
COVER PROBE W GEL 5X96 (DRAPES) ×3 IMPLANT
DECANTER SPIKE VIAL GLASS SM (MISCELLANEOUS) IMPLANT
DRAPE LAPAROSCOPIC ABDOMINAL (DRAPES) IMPLANT
DRAPE U-SHAPE 76X120 STRL (DRAPES) ×3 IMPLANT
DRSG TEGADERM 4X4.75 (GAUZE/BANDAGES/DRESSINGS) ×3 IMPLANT
ELECT COATED BLADE 2.86 ST (ELECTRODE) ×3 IMPLANT
ELECT REM PT RETURN 9FT ADLT (ELECTROSURGICAL) ×3
ELECTRODE REM PT RTRN 9FT ADLT (ELECTROSURGICAL) ×2 IMPLANT
GLOVE BIO SURGEON STRL SZ7 (GLOVE) ×3 IMPLANT
GLOVE BIOGEL PI IND STRL 7.5 (GLOVE) ×2 IMPLANT
GLOVE BIOGEL PI INDICATOR 7.5 (GLOVE) ×1
GOWN STRL REUS W/ TWL LRG LVL3 (GOWN DISPOSABLE) ×4 IMPLANT
GOWN STRL REUS W/TWL LRG LVL3 (GOWN DISPOSABLE) ×6
ILLUMINATOR WAVEGUIDE N/F (MISCELLANEOUS) IMPLANT
LIGHT WAVEGUIDE WIDE FLAT (MISCELLANEOUS) IMPLANT
LIQUID BAND (GAUZE/BANDAGES/DRESSINGS) ×3 IMPLANT
MARKER SKIN DUAL TIP RULER LAB (MISCELLANEOUS) ×3 IMPLANT
NDL HYPO 25X1 1.5 SAFETY (NEEDLE) ×1 IMPLANT
NEEDLE HYPO 25X1 1.5 SAFETY (NEEDLE) ×3 IMPLANT
NS IRRIG 1000ML POUR BTL (IV SOLUTION) IMPLANT
PACK BASIN DAY SURGERY FS (CUSTOM PROCEDURE TRAY) ×3 IMPLANT
PENCIL BUTTON HOLSTER BLD 10FT (ELECTRODE) ×3 IMPLANT
SLEEVE SCD COMPRESS KNEE MED (MISCELLANEOUS) ×3 IMPLANT
SPONGE GAUZE 4X4 12PLY STER LF (GAUZE/BANDAGES/DRESSINGS) ×3 IMPLANT
SPONGE LAP 4X18 X RAY DECT (DISPOSABLE) ×3 IMPLANT
STAPLER VISISTAT 35W (STAPLE) IMPLANT
STOCKINETTE IMPERVIOUS LG (DRAPES) IMPLANT
STRIP CLOSURE SKIN 1/2X4 (GAUZE/BANDAGES/DRESSINGS) ×3 IMPLANT
SUT MNCRL AB 4-0 PS2 18 (SUTURE) ×3 IMPLANT
SUT SILK 2 0 SH (SUTURE) IMPLANT
SUT VIC AB 2-0 SH 27 (SUTURE) ×3
SUT VIC AB 2-0 SH 27XBRD (SUTURE) ×2 IMPLANT
SUT VIC AB 3-0 SH 27 (SUTURE) ×3
SUT VIC AB 3-0 SH 27X BRD (SUTURE) ×2 IMPLANT
SUT VICRYL AB 3 0 TIES (SUTURE) IMPLANT
SYR CONTROL 10ML LL (SYRINGE) ×3 IMPLANT
TOWEL OR 17X24 6PK STRL BLUE (TOWEL DISPOSABLE) ×3 IMPLANT
TOWEL OR NON WOVEN STRL DISP B (DISPOSABLE) ×3 IMPLANT
TUBE CONNECTING 20X1/4 (TUBING) IMPLANT
YANKAUER SUCT BULB TIP NO VENT (SUCTIONS) IMPLANT

## 2015-06-19 NOTE — Op Note (Signed)
Preoperative diagnosis: Stage IV breast cancer Postoperative diagnosis: Same as above Procedure: Right neck lymph node excisional biopsy Surgeon: Dr. Serita Grammes Anesthesia: Gen. Estimated blood loss: Minimal Complications none Drains: None Specimens: Right neck lymph node to pathology Sponge count was correct at completion Description recovery stable  Indications: This is a 72 a female known well since she began with a stage IIIc breast cancer. She was subsequently diagnosed with stage IV breast cancer after a supraclavicular node biopsy has been undergoing treatment for that. She now has a posterior triangle right neck lymph node that has become enlarged. I discussed with her oncologist and we decided to proceed with an excisional biopsy to see if the tumors characteristics of changed. We discussed excisional biopsy.  Procedure: After informed consent was obtained the patient was taken to the operating room. She and I both identified the lymph node prior to beginning. She was placed under general anesthesia. She had sequential compression devices on her legs. She was given antibiotics. She was then prepped and draped in the standard sterile surgical fashion. A surgical timeout was then performed.  I made a transverse incision one of her skin creases in the posterior triangle of a right neck. I then was able to dissect the lymph node free from the surrounding structures. I did remove the node in its entirety. I clipped the draining lymphatics. This was then passed off the table and sent to pathology. Hemostasis was obtained. I then closed this with 3-0 Vicryl, 4-0 Monocryl, and Dermabond. She was extubated and transferred to recovery in stable condition.

## 2015-06-19 NOTE — Anesthesia Preprocedure Evaluation (Signed)
Anesthesia Evaluation  Patient identified by MRN, date of birth, ID band Patient awake    Reviewed: Allergy & Precautions, NPO status , Patient's Chart, lab work & pertinent test results  Airway Mallampati: II   Neck ROM: full    Dental   Pulmonary    breath sounds clear to auscultation       Cardiovascular negative cardio ROS   Rhythm:regular Rate:Normal     Neuro/Psych  Headaches,    GI/Hepatic   Endo/Other    Renal/GU      Musculoskeletal   Abdominal   Peds  Hematology   Anesthesia Other Findings   Reproductive/Obstetrics                             Anesthesia Physical Anesthesia Plan  ASA: II  Anesthesia Plan: General   Post-op Pain Management:    Induction: Intravenous  Airway Management Planned: Oral ETT  Additional Equipment:   Intra-op Plan:   Post-operative Plan: Extubation in OR  Informed Consent: I have reviewed the patients History and Physical, chart, labs and discussed the procedure including the risks, benefits and alternatives for the proposed anesthesia with the patient or authorized representative who has indicated his/her understanding and acceptance.     Plan Discussed with: CRNA, Anesthesiologist and Surgeon  Anesthesia Plan Comments:         Anesthesia Quick Evaluation

## 2015-06-19 NOTE — H&P (Signed)
[name] is an 64 y.o. female.   Chief Complaint: enlarged right neck node HPI:   32 yof who I know well from prior treatment for breast cancer. She underwent treatment including left mrm, chemo, radiotherapy in 2010 for stage IIIc adenocarcinoma of the left breast. Then had a left supraclavicular node recurrence. I biopsied this node and returned as morphologically similar to initial cancer and was er pos, pr focally pos, her 2 not amplified. she recently has been on palbo and faslodex but this is now off due to enlarging right posterior triangle node. this area has noted some rapid enlargement and was 8 mm on prior pet scan. Dr Tressie Stalker would like to do excision for prognostic panel and [facility] one eval and needs more tissue than just a core biopsy she is not having a lot of sob right now. she is having some constipation and brb and is getting ct ab/pelvis soon also.  Past Medical History  Diagnosis Date  . Osteoporosis 01/27/2011  . Borderline hypertension   . Pneumonia   . Bladder infection   . Fatigue   . Generalized headaches   . Wears glasses   . Swollen lymph nodes   . Retinal detachment   . Reflux   . URI (upper respiratory infection) 02/17/12  . Adenocarcinoma of breast ([place]) 01/27/2011    stage IIIc breast cancer  . Breast cancer ([place])   . Post-mastectomy lymphedema syndrome   . Closed fracture of humerus sept 2013  . Port catheter in place 04/14/2013    Past Surgical History  Procedure Laterality Date  . Cataract extraction    . Cervical disc fusioni    . Retinal detachment and repair    . Btl    . Mastectomy modified radical    . Port-a-cath removal      in place (not removed)  . Biopsy of lymph node      super clavicle  . Breast surgery      Family History  Problem Relation Age of Onset  . Hypertension Mother   . Heart disease Mother     atrial fib  . Heart disease Father   . Other Father     glaucoma   Social History:  reports that she has  never smoked. She has never used smokeless tobacco. She reports that she drinks about 4.2 oz of alcohol per week. She reports that she does not use illicit drugs.  Allergies:  Allergies  Allergen Reactions  . Ciprofloxacin Other (See Comments)    tendonitis    Medications Prior to Admission  Medication Sig Dispense Refill  . calcium-vitamin D (OSCAL WITH D) 500-200 MG-UNIT per tablet Take 2 tablets by mouth daily.     . diphenhydrAMINE (SOMINEX) 25 MG tablet Take 25 mg by mouth at bedtime as needed. To help with sleeping    . escitalopram (LEXAPRO) 10 MG tablet Take 1 tablet (10 mg total) by mouth daily. 30 tablet 5  . folic acid (FOLVITE) 128 MCG tablet Take 400 mcg by mouth daily.      . metoprolol tartrate (LOPRESSOR) 25 MG tablet Take 25 mg by mouth daily.    . nitrofurantoin (MACRODANTIN) 100 MG capsule Take 100 mg by mouth 2 (two) times daily.    Marland Kitchen omeprazole (PRILOSEC) 20 MG capsule Take 20 mg by mouth daily.      Results for orders placed or performed during the hospital encounter of 06/19/15 (from the past 48 hour(s))  Basic metabolic panel  Status: Abnormal   Collection Time: 06/17/15  2:58 PM  Result Value Ref Range   Sodium 137 135 - 145 mmol/L   Potassium 4.9 3.5 - 5.1 mmol/L   Chloride 100 (L) 101 - 111 mmol/L   CO2 27 22 - 32 mmol/L   Glucose, Bld 99 65 - 99 mg/dL   BUN 12 6 - 20 mg/dL   Creatinine, Ser 0.75 0.44 - 1.00 mg/dL   Calcium 9.7 8.9 - 10.3 mg/dL   GFR calc non Af Amer >60 >60 mL/min   GFR calc Af Amer >60 >60 mL/min    Comment: (NOTE) The eGFR has been calculated using the CKD EPI equation. This calculation has not been validated in all clinical situations. eGFR's persistently <60 mL/min signify possible Chronic Kidney Disease.    Anion gap 10 5 - 15  CBC WITH DIFFERENTIAL     Status: Abnormal   Collection Time: 06/17/15  2:58 PM  Result Value Ref Range   WBC 8.2 4.0 - 10.5 K/uL   RBC 3.98 3.87 - 5.11 MIL/uL   Hemoglobin 13.6 12.0 - 15.0 g/dL    HCT 41.0 36.0 - 46.0 %   MCV 103.0 (H) 78.0 - 100.0 fL   MCH 34.2 (H) 26.0 - 34.0 pg   MCHC 33.2 30.0 - 36.0 g/dL   RDW 13.9 11.5 - 15.5 %   Platelets 195 150 - 400 K/uL   Neutrophils Relative % 65 %   Neutro Abs 5.4 1.7 - 7.7 K/uL   Lymphocytes Relative 24 %   Lymphs Abs 2.0 0.7 - 4.0 K/uL   Monocytes Relative 10 %   Monocytes Absolute 0.8 0.1 - 1.0 K/uL   Eosinophils Relative 1 %   Eosinophils Absolute 0.0 0.0 - 0.7 K/uL   Basophils Relative 0 %   Basophils Absolute 0.0 0.0 - 0.1 K/uL  Protime-INR     Status: None   Collection Time: 06/17/15  2:58 PM  Result Value Ref Range   Prothrombin Time 13.4 11.6 - 15.2 seconds   INR 1.00 0.00 - 1.49   No results found.  ROS Negative  Blood pressure 133/62, pulse 61, temperature 98.3 F (36.8 C), temperature source Oral, resp. rate 18, height '5\' 4"'  (1.626 m), weight 61.508 kg (135 lb 9.6 oz), SpO2 100 %. Physical Exam  Vitals (Sonya Bynum CMA; 06/13/2015 2:24 PM) 06/13/2015 2:24 PM Weight: 136 lb Height: 64in Body Surface Area: 1.66 m Body Mass Index: 23.34 kg/m  Temp.: 21F(Temporal)  Pulse: 75 (Regular)  BP: 126/74 (Sitting, Left Arm, Standard)   Physical Exam Rolm Bookbinder MD; 06/13/2015 2:44 PM) General Mental Status-Alert. Orientation-Oriented X3. Head and Neck Note: right posterior triangle with 2 cm mobile node that is firm  Assessment/Plan  Assessment & Plan Rolm Bookbinder MD; 06/13/2015 2:44 PM) BREAST CANCER, STAGE 4 (C50.919) Story: Has enlarging node on right neck, will plan for right neck node excision. I have discussed with med onc Dr Tressie Stalker and they need more tissue than for core to do prog panel and found one testing. she is due to be seen at [facility] for study soon also. I discussed with her proceeding with excision next week asap. risks including injury to spinal accessory nerve were discussed.  Charolette Bultman 06/19/2015, 12:38 PM

## 2015-06-19 NOTE — Interval H&P Note (Signed)
History and Physical Interval Note:  06/19/2015 12:39 PM  [name]  has presented today for surgery, with the diagnosis of HISTORY OF BREAST CANCER  The various methods of treatment have been discussed with the patient and family. After consideration of risks, benefits and other options for treatment, the patient has consented to  Procedure(s): RIGHT NECK [place] (Right) as a surgical intervention .  The patient's history has been reviewed, patient examined, no change in status, stable for surgery.  I have reviewed the patient's chart and labs.  Questions were answered to the patient's satisfaction.     Dov Dill

## 2015-06-19 NOTE — Transfer of Care (Signed)
Immediate Anesthesia Transfer of Care Note  Patient: [name]  Procedure(s) Performed: Procedure(s): RIGHT NECK [place] NODE EXCISION (Right)  Patient Location: PACU  Anesthesia Type:General  Level of Consciousness: awake  Airway & Oxygen Therapy: Patient Spontanous Breathing and Patient connected to face mask oxygen  Post-op Assessment: Report given to RN and Post -op Vital signs reviewed and stable  Post vital signs: Reviewed and stable  Last Vitals:  Filed Vitals:   06/19/15 1324  BP:   Pulse: 76  Temp:   Resp: 18    Complications: No apparent anesthesia complications

## 2015-06-19 NOTE — Anesthesia Procedure Notes (Signed)
Procedure Name: LMA Insertion Date/Time: 06/19/2015 12:48 PM Performed by: Lieutenant Diego Pre-anesthesia Checklist: Patient identified, Emergency Drugs available, Suction available and Patient being monitored Patient Re-evaluated:Patient Re-evaluated prior to inductionOxygen Delivery Method: Circle System Utilized Preoxygenation: Pre-oxygenation with 100% oxygen Intubation Type: IV induction Ventilation: Mask ventilation without difficulty LMA: LMA inserted LMA Size: 4.0 Number of attempts: 1 Airway Equipment and Method: Bite block Placement Confirmation: positive ETCO2 and breath sounds checked- equal and bilateral Tube secured with: Tape Dental Injury: Teeth and Oropharynx as per pre-operative assessment

## 2015-06-19 NOTE — Anesthesia Postprocedure Evaluation (Signed)
Anesthesia Post Note  Patient: [name]  Procedure(s) Performed: Procedure(s) (LRB): RIGHT NECK [place] NODE EXCISION (Right)  Anesthesia type: General  Patient location: PACU  Post pain: Pain level controlled and Adequate analgesia  Post assessment: Post-op Vital signs reviewed, Patient's Cardiovascular Status Stable, Respiratory Function Stable, Patent Airway and Pain level controlled  Last Vitals:  Filed Vitals:   06/19/15 1358  BP:   Pulse: 60  Temp:   Resp: 19    Post vital signs: Reviewed and stable  Level of consciousness: awake, alert  and oriented  Complications: No apparent anesthesia complications

## 2015-06-19 NOTE — Discharge Instructions (Signed)
[place] Office Phone Number [phone]   POST OP INSTRUCTIONS  Always review your discharge instruction sheet given to you by the facility where your surgery was performed.  IF YOU HAVE DISABILITY OR FAMILY LEAVE FORMS, YOU MUST BRING THEM TO THE OFFICE FOR PROCESSING.  DO NOT GIVE THEM TO YOUR DOCTOR.  1. A prescription for pain medication may be given to you upon discharge.  Take your pain medication as prescribed, if needed.  If narcotic pain medicine is not needed, then you may take acetaminophen (Tylenol), naprosyn (Alleve) or ibuprofen (Advil) as needed. 2. Take your usually prescribed medications unless otherwise directed 3. If you need a refill on your pain medication, please contact your pharmacy.  They will contact our office to request authorization.  Prescriptions will not be filled after 5pm or on week-ends. 4. You should eat very light the first 24 hours after surgery, such as soup, crackers, pudding, etc.  Resume your normal diet the day after surgery. 5. Most patients will experience some swelling and bruising in the breast.  Ice packs and a good support bra will help.  Wear the breast binder provided or a sports bra for 72 hours day and night.  After that wear a sports bra during the day until you return to the office. Swelling and bruising can take several days to resolve.  6. It is common to experience some constipation if taking pain medication after surgery.  Increasing fluid intake and taking a stool softener will usually help or prevent this problem from occurring.  A mild laxative (Milk of Magnesia or Miralax) should be taken according to package directions if there are no bowel movements after 48 hours. 7. Unless discharge instructions indicate otherwise, you may remove your bandages 48 hours after surgery and you may shower at that time.  You may have steri-strips (small skin tapes) in place directly over the incision.  These strips should be left on the  skin for 7-10 days and will come off on their own.  If your surgeon used skin glue on the incision, you may shower in 24 hours.  The glue will flake off over the next 2-3 weeks.  Any sutures or staples will be removed at the office during your follow-up visit. 8. ACTIVITIES:  You may resume regular daily activities (gradually increasing) beginning the next day.  a. You may drive when you no longer are taking prescription pain medication, you can comfortably wear a seatbelt, and you can safely maneuver your car and apply brakes. b. RETURN TO WORK:  ______________________________________________________________________________________ 9. You should see your doctor in the office for a follow-up appointment approximately two weeks after your surgery.  Your doctors nurse will typically make your follow-up appointment when she calls you with your pathology report.  Expect your pathology report 3-4 business days after your surgery.  You may call to check if you do not hear from [place] after three days. 10. OTHER INSTRUCTIONS: _______________________________________________________________________________________________ _____________________________________________________________________________________________________________________________________ _____________________________________________________________________________________________________________________________________ _____________________________________________________________________________________________________________________________________  WHEN TO CALL DR WAKEFIELD: 1. Fever over 101.0 2. Nausea and/or vomiting. 3. Extreme swelling or bruising. 4. Continued bleeding from incision. 5. Increased pain, redness, or drainage from the incision.  The clinic staff is available to answer your questions during regular business hours.  Please dont hesitate to call and ask to speak to one of the nurses for clinical concerns.  If you have a  medical emergency, go to the nearest emergency room or call 911.  A surgeon from [facility]  is always on call at the hospital.  For further questions, please visit [email or web address]     Post Anesthesia Home Care Instructions  Activity: Get plenty of rest for the remainder of the day. A responsible adult should stay with you for 24 hours following the procedure.  For the next 24 hours, DO NOT: -Drive a car -Paediatric nurse -Drink alcoholic beverages -Take any medication unless instructed by your physician -Make any legal decisions or sign important papers.  Meals: Start with liquid foods such as gelatin or soup. Progress to regular foods as tolerated. Avoid greasy, spicy, heavy foods. If nausea and/or vomiting occur, drink only clear liquids until the nausea and/or vomiting subsides. Call your physician if vomiting continues.  Special Instructions/Symptoms: Your throat may feel dry or sore from the anesthesia or the breathing tube placed in your throat during surgery. If this causes discomfort, gargle with warm salt water. The discomfort should disappear within 24 hours.  If you had a scopolamine patch placed behind your ear for the management of post- operative nausea and/or vomiting:  1. The medication in the patch is effective for 72 hours, after which it should be removed.  Wrap patch in a tissue and discard in the trash. Wash hands thoroughly with soap and water. 2. You may remove the patch earlier than 72 hours if you experience unpleasant side effects which may include dry mouth, dizziness or visual disturbances. 3. Avoid touching the patch. Wash your hands with soap and water after contact with the patch.    Call your surgeon if you experience:   1.  Fever over 101.0. 2.  Inability to urinate. 3.  Nausea and/or vomiting. 4.  Extreme swelling or bruising at the surgical site. 5.  Continued bleeding from the incision. 6.  Increased pain, redness  or drainage from the incision. 7.  Problems related to your pain medication. 8. Any change in color, movement and/or sensation 9. Any problems and/or concerns

## 2015-06-20 ENCOUNTER — Encounter (HOSPITAL_BASED_OUTPATIENT_CLINIC_OR_DEPARTMENT_OTHER): Payer: Self-pay | Admitting: General Surgery

## 2015-06-27 ENCOUNTER — Encounter (HOSPITAL_BASED_OUTPATIENT_CLINIC_OR_DEPARTMENT_OTHER): Payer: Self-pay | Admitting: General Surgery

## 2015-07-05 ENCOUNTER — Encounter (HOSPITAL_COMMUNITY): Payer: Self-pay

## 2015-07-31 ENCOUNTER — Other Ambulatory Visit (HOSPITAL_COMMUNITY): Payer: Self-pay | Admitting: Oncology

## 2015-07-31 DIAGNOSIS — C50912 Malignant neoplasm of unspecified site of left female breast: Secondary | ICD-10-CM

## 2015-08-01 ENCOUNTER — Ambulatory Visit (HOSPITAL_COMMUNITY)
Admission: RE | Admit: 2015-08-01 | Discharge: 2015-08-01 | Disposition: A | Discharge status: Home / Self Care | Payer: BLUE CROSS/BLUE SHIELD | Source: Ambulatory Visit | Attending: Oncology | Admitting: Oncology

## 2015-08-01 DIAGNOSIS — C50912 Malignant neoplasm of unspecified site of left female breast: Secondary | ICD-10-CM

## 2015-08-01 DIAGNOSIS — C7951 Secondary malignant neoplasm of bone: Secondary | ICD-10-CM | POA: Insufficient documentation

## 2015-08-01 DIAGNOSIS — R918 Other nonspecific abnormal finding of lung field: Secondary | ICD-10-CM | POA: Diagnosis not present

## 2015-08-01 DIAGNOSIS — C770 Secondary and unspecified malignant neoplasm of lymph nodes of head, face and neck: Secondary | ICD-10-CM | POA: Diagnosis not present

## 2015-08-01 LAB — GLUCOSE, CAPILLARY: Glucose-Capillary: 99 mg/dL (ref 65–99)

## 2015-08-01 MED ORDER — FLUDEOXYGLUCOSE F - 18 (FDG) INJECTION
6.7000 | Freq: Once | INTRAVENOUS | Status: AC | PRN
  Administered 2015-08-01: 6.7 via INTRAVENOUS

## 2015-10-23 ENCOUNTER — Other Ambulatory Visit (HOSPITAL_COMMUNITY): Payer: Self-pay | Admitting: Oncology

## 2015-10-23 DIAGNOSIS — C50912 Malignant neoplasm of unspecified site of left female breast: Secondary | ICD-10-CM

## 2015-11-15 ENCOUNTER — Ambulatory Visit (HOSPITAL_COMMUNITY)
Admission: RE | Admit: 2015-11-15 | Discharge: 2015-11-15 | Disposition: A | Discharge status: Home / Self Care | Payer: BLUE CROSS/BLUE SHIELD | Source: Ambulatory Visit | Attending: Oncology | Admitting: Oncology

## 2015-11-15 DIAGNOSIS — C50912 Malignant neoplasm of unspecified site of left female breast: Secondary | ICD-10-CM

## 2015-11-15 DIAGNOSIS — R933 Abnormal findings on diagnostic imaging of other parts of digestive tract: Secondary | ICD-10-CM | POA: Diagnosis not present

## 2015-11-15 DIAGNOSIS — Z1501 Genetic susceptibility to malignant neoplasm of breast: Secondary | ICD-10-CM | POA: Diagnosis present

## 2015-11-15 DIAGNOSIS — C7951 Secondary malignant neoplasm of bone: Secondary | ICD-10-CM | POA: Insufficient documentation

## 2015-11-15 LAB — GLUCOSE, CAPILLARY: Glucose-Capillary: 89 mg/dL (ref 65–99)

## 2015-11-15 MED ORDER — FLUDEOXYGLUCOSE F - 18 (FDG) INJECTION
7.0000 | Freq: Once | INTRAVENOUS | Status: AC | PRN
  Administered 2015-11-15: 7 via INTRAVENOUS

## 2015-12-16 DIAGNOSIS — Z1509 Genetic susceptibility to other malignant neoplasm: Secondary | ICD-10-CM | POA: Insufficient documentation

## 2015-12-16 DIAGNOSIS — Z1501 Genetic susceptibility to malignant neoplasm of breast: Secondary | ICD-10-CM | POA: Insufficient documentation

## 2016-01-14 ENCOUNTER — Other Ambulatory Visit (HOSPITAL_COMMUNITY): Payer: Self-pay | Admitting: General Surgery

## 2016-01-14 DIAGNOSIS — C211 Malignant neoplasm of anal canal: Secondary | ICD-10-CM

## 2016-01-17 ENCOUNTER — Encounter (HOSPITAL_COMMUNITY): Payer: Self-pay | Admitting: Oncology

## 2016-01-17 ENCOUNTER — Encounter (HOSPITAL_COMMUNITY): Payer: BLUE CROSS/BLUE SHIELD | Attending: Oncology | Admitting: Oncology

## 2016-01-17 VITALS — BP 150/69 | HR 95 | Temp 98.1°F | Resp 16 | Ht 64.0 in | Wt 141.4 lb

## 2016-01-17 DIAGNOSIS — C50912 Malignant neoplasm of unspecified site of left female breast: Secondary | ICD-10-CM

## 2016-01-17 DIAGNOSIS — C210 Malignant neoplasm of anus, unspecified: Secondary | ICD-10-CM

## 2016-01-17 DIAGNOSIS — C7951 Secondary malignant neoplasm of bone: Secondary | ICD-10-CM

## 2016-01-17 DIAGNOSIS — N390 Urinary tract infection, site not specified: Secondary | ICD-10-CM

## 2016-01-17 DIAGNOSIS — C20 Malignant neoplasm of rectum: Secondary | ICD-10-CM

## 2016-01-17 HISTORY — DX: Malignant neoplasm of anus, unspecified: C21.0

## 2016-01-17 HISTORY — DX: Secondary malignant neoplasm of bone: C79.51

## 2016-01-17 HISTORY — DX: Malignant neoplasm of rectum: C20

## 2016-01-17 HISTORY — DX: Urinary tract infection, site not specified: N39.0

## 2016-01-17 NOTE — Assessment & Plan Note (Addendum)
Squamous cell carcinoma of anus.    Oncology history developed.  I do not have pathology available to me. I reviewed Dr. Zachery Dauer most recent note which I received this morning reporting that the patient's case was discussed at cancer conference with Dr. Ledon Snare, Dr. Kyung Rudd, pathologist, and gastroenterologist.  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 It was originally reported to me that she has rectal cancer but based upon Dr. Jaclyn Prime most recent note, and the histology, anal squamous cell carcinoma makes sense.  Fortunately, at time of imaging with PET scan, there was no evidence of metastatic disease.  We agree with Dr. Jaclyn Prime recommendations regarding treatment. We will discuss the patient's case with Dr. Kyung Rudd and Dr. Leighton Ruff. We will plan on preserving the patient's bone marrow as much as possible and utilizing Xeloda with radiation therapy (rather than standard therapy with 5-FU and mitomycin-C). We will gain the benefit of breast cancer treatment with Xeloda could be continued post treatment.  HPV testing on the tumor was requested by Dr. Tressie Stalker. I have contacted [facility] pathology requesting that they ascertain the sample and provide [place] a report regarding pathology.  Dr. Tressie Stalker does dictate that there is a history of anal intercourse in the remote past.  Patient will go to her trip to [place] as planned on 01/25/2016. In the interim, we will get Xeloda approved for her. Following her trip, she will return to clinic for chemotherapy teaching, port flush, laboratory work, and to start therapy. We will need to coordinate the start of Xeloda with radiation therapy. We will wait to hear from Dr. Lisbeth Renshaw regards to the start of radiation therapy.

## 2016-01-17 NOTE — Assessment & Plan Note (Addendum)
Stage IV breast cancer ER/PR positive, HER-2/neu negative. BRCA2 POSITIVE.  History of stage IIIc left breast cancer in 2009 treated with neoadjuvant chemotherapy consisting of epirubicin and Cytoxan followed by 4 cycles of docetaxel from 04/16/2008-08/13/2008 with the initiation of Herceptin for 52 weeks at the start of docetaxel. She then underwent a left mastectomy on 08/30/2008 showing persistent disease in the breast and lymph nodes. This was followed by radiation therapy from 09/17/2008-11/26/2008 to the left chest wall, supraclavicular, and axillary node areas. She then moved on to antiestrogen therapy with letrozole (11/27/2008-04/20/2011). She was found to have recurrent breast cancer on supraclavicular lymph node biopsy on 03/30/2011 leading to a change in antiestrogen therapy to tamoxifen (04/21/2011-06/04/2014). Pet imaging on 05/25/2014 demonstrated progression of left submandibular lymph node, mediastinal lymph node, and probable lung involvement. This was followed by a biopsy on 06/13/2014 of a left supraclavicular mass that did reveal metastatic carcinoma consistent with breast primary. She was then treated with carboplatin and Taxol for 6 cycles with an excellent response (06/25/2014-10/09/2014). She was then transition back to hormone therapy consisting of Ibrance and Faslodex (11/13/2014-06/10/2015). This was complicated by grade 3 neutropenia with cycle #2 resulting in a dose reduction of Ibrance to 100 mg for cycle 3. Unfortunately, on 06/10/2015, she was noted to have progression of disease in the right mid cervical lymph node over a 4 day period that was PET avid. Therapy was therefore changed to exemestane and everolimus (45/85/9292-44/62/8638) that was complicated by an adverse reaction consisting of diffuse pneumonitis. This regimen was therefore discontinued. She then transitioned back to systemic chemotherapy consisting of cisplatin every 14 days (08/23/2015-12/23/2015). She was put on a  drug holiday beginning on 12/23/2015 in preparation for a trip to [place] on 01/25/2016.  Her oncology history is developed.  Currently, she has developed an anal cancer that will require local treatment consisting of chemoradiation. Treatment with Xeloda has been discussed and this can be utilized post local treatment for maintenance for her breast cancer.  She'll return in approximately 2 weeks' time for chemotherapy teaching and coordination to start Xeloda with radiation.  She has seen Dr. Mauro Kaufmann Muss in the past for second opinion.  I did briefly discuss CODE STATUS given her incurable disease. At this time, she wishes to be a FULL CODE.  She is not unrealistic with regards to the incurability of her breast cancer. Performance status is excellent. In the future, we will need to readdress this issue as she declines. Fortunately, from a breast cancer perspective, her options are becoming more limited.

## 2016-01-17 NOTE — Progress Notes (Signed)
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 female with a medical history significant for Stage IV ER/PR+ breast cancer with initial diagnosis showing HER2 POSITIVITY initially diagnosed with Stage IIIC disease in 2012 and treated curative intent with metastatic disease discovered in October 2015 and osseous involvement noted on PET imaging on 09/10/2014, who is referred to the [facility] for transfer of care with Stage IV breast cancer and newly diagnosed squamous cell carcinoma of anus.    Adenocarcinoma of left breast   04/09/2008 Pathology Results Positive for breast cancer, ER 100%, PR 5%, HER-2/neu POSITIVE.   04/09/2008 Procedure Biopsy of left axillary node mass    04/16/2008 - 08/13/2008 Chemotherapy Dose dense epirubicin/Cytoxan 4 cycles followed by docetaxel 4 cycles. Herceptin initiated with docetaxel for a total of 52 weeks.   08/30/2008 Definitive Surgery Left mastectomy revealing persistence of disease in the breast and lymph nodes.   09/17/2008 - 11/26/2008 Radiation Therapy Radiation to left chest wall, supraclavicular, and axillary nodal areas.   11/27/2008 - 04/20/2011 Anti-estrogen oral therapy Letrozole   03/30/2011 Procedure Left supraclavicular lymph node biopsy   03/30/2011 Pathology Results Recurrent breast cancer, invasive ductal type, ER 100%, PR 40%, HER-2 NEGATIVE   04/21/2011 - 06/04/2014 Anti-estrogen oral therapy Tamoxifen   05/25/2014 PET scan Progression in left submandibular lymph node, mediastinal lymph  nodes, and probable lung involvement.   05/25/2014 Progression PET scan demonstrates progression of disease.   06/13/2014 Procedure Left supraclavicular mass biopsy   06/13/2014 Pathology Results Metastatic carcinoma consistent with breast primary, involving skeletal muscle, perineural invasion, LVI, no lymphoid tissue identified, ER 98%, PR negative, HER-2 negative   06/25/2014 - 10/09/2014 Chemotherapy Carboplatin/Taxol 6 cycles with excellent response   11/06/2014 - 06/10/2015 Chemotherapy Palbociclib and Faslodex   01/08/2015 Adverse Reaction Grade 3 neutropenia with cycle #2 of Palbociclib.   01/08/2015 Treatment Plan Change Palbociclib dose reduced to 100 mg for cycle #3.   06/10/2015 Progression Right cervical lymph node progression clinically developed over 4 days.  This lymph node was positive on PET scan in September 2016 and at that time was approximately 8 mm with possible progression in a single bone metastasis.   06/17/2015 Procedure Biopsy of right cervical lymph node   06/17/2015 Pathology Results Genetic sequencing performed revealing BRCA2 positivity   07/02/2015 - 08/01/2015 Chemotherapy Exemestane/everolimus, 25 mg/ 5 mg x 7 days then increasing to 10 mg daily.   08/01/2015 PET scan Diffuse pneumonitis.     08/01/2015 Adverse Reaction Pneumonitis secondary to drug reaction from everolimus.   08/23/2015 - 12/23/2015 Chemotherapy Cisplatin 40 mg/m every other week   11/15/2015 PET scan Interval resolution of hypermetabolic airspace opacities in the lungs which were likely inflammatory. Abnormal high activity at the anal rectal junction. Very low grade residual activity at the site of L3 metastatic lesion.   12/23/2015 Treatment Plan Change Holiday from treatment in preparation for trip to [place].    Squamous cell carcinoma of anus ([place])   12/31/2015 Procedure Colonoscopy  by Dr. Britta Mccreedy   12/31/2015 Pathology Results Invasive squamous cell carcinoma approximate 4 cm mass posterior wall of  rectum   01/13/2016 Treatment Plan Change Dr. Leighton Ruff consultation- no surgery.  Recommend chemoXRT    Chart is reviewed. Oncology history is developed. Staging is completed. All dictations are reviewed.   Newly diagnosed squamous cell rectal cancer has been evaluated by a radiation therapy standpoint. He saw Dr. Lisbeth Renshaw on 01/10/2016. I have his note available and this is reviewed:  One of Dr. Jaclyn Prime primary concern is that of attempting to spare the patient's bone marrow reserve is to maintain the ability to tolerate chemotherapy given that the dominant life threatening issue remains her metastatic breast cancer. However, the patient is symptomatic from her rectal cancer and this also needs to be addressed with local therapy. The patient is seeing Dr. Marcello Moores to discuss possible surgery. If this is felt to be warranted as initial treatment I believe this would be reasonable and I would not street feel strongly about preceding this with radiation treatment or chemoradiation treatment. However, if the patient will not undergo surgery after this discussion, and I would recommend palliative radiation treatment to the pelvis. This could be done in a focal manner to a greater extent than normal for rectal cancer and sparing of the bone marrow could be achieved to a much greater extent than a traditional rectal cancer plan. The patient's treatment could consist of traditional 5-1/2 week course of treatment or a shorter course of treatment such as a two-week course. One additional alternative would involve 5 Gy5 fraction as is often more commonly done in [place], which also may be a good short course option for the patient. With the diagnosis of squamous cell cancer, the patient may have a very radiosensitive tumor if she does require palliative radiation therapy.  All of this was discussed with the patient. We will follow up on her discussion with Dr. Marcello Moores on Monday and then proceed accordingly. The  patient could undergo simulation in a week on Thursday if this appears appropriate. All the patient's questions were answered today and she states that she is comfortable with this plan.  The patient reports that she did meet with Dr. Leighton Ruff on Monday, 01/13/2016. She notes that at this point in time is recommended that she pursue chemoradiation with Capecitabine.  She is scheduled for CT imaging of abdomen and pelvis by Dr. Marcello Moores on 01/22/2016 to evaluate node status. The patient questions whether she really needs to have this CT scan given recent PET scan imaging. Her last PET scan was in April and therefore 8 weeks plus have passed between 2 imaging tests. She asks if we will touch base with Dr. Marcello Moores and see if she really needs a CT imaging.  The patient denies any complaints.  She is planning a trip to [place] and will be leaving on Saturday, 01/25/2016. She will be there for one week.  She reports that she is moving her bowels well. She reports her stools are loose. She denies any constipation. She denies pencil thin stools. She is not currently taking any laxatives/stool softeners.   Review of Systems  Constitutional: Negative.  Negative for fever, chills and weight loss.  HENT: Negative.  Negative for hearing loss and tinnitus.   Eyes: Negative.  Negative for blurred vision and double vision.  Respiratory: Negative.  Negative for cough, hemoptysis, sputum production, shortness of breath and wheezing.   Cardiovascular: Negative.  Negative for chest pain.  Gastrointestinal: Positive for diarrhea (Loose stools, multiple times per day.). Negative for nausea, vomiting, abdominal pain, constipation, blood in stool and melena.  Genitourinary: Negative.  Negative for dysuria, urgency, frequency and hematuria.  Musculoskeletal: Negative.  Negative for myalgias, back pain, joint pain and falls.  Skin: Negative.   Neurological: Negative.  Negative for dizziness, sensory change, speech  change, focal weakness, seizures, loss of consciousness, weakness and headaches.  Endo/Heme/Allergies: Negative.   Psychiatric/Behavioral: Negative.   14 point review of systems was performed and is negative except as detailed under history of present illness and above  PAST MEDICAL HISTORY:   Past Medical History  Diagnosis Date  . Osteoporosis 01/27/2011  . Borderline hypertension   . Pneumonia   . Bladder infection   . Fatigue   . Generalized headaches   . Wears glasses   . Swollen lymph nodes   . Retinal detachment   . Reflux   . URI (upper respiratory infection) 02/17/12  . Adenocarcinoma of breast ([place]) 01/27/2011    stage IIIc breast cancer  . Breast cancer ([place])   . Post-mastectomy lymphedema syndrome   . Closed fracture of humerus sept 2013  . Port catheter in place 04/14/2013  . Squamous cell carcinoma of rectum ([place]) 01/17/2016  . Bone metastasis ([place]) 01/17/2016  . 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 Recurrent UTI 01/17/2016    ALLERGIES: Allergies  Allergen Reactions  . Ciprofloxacin Other (See Comments)    tendonitis      MEDICATIONS: I have reviewed the patient's current medications.    Current Outpatient Prescriptions on File Prior to Visit  Medication Sig Dispense Refill  . calcium-vitamin D (OSCAL WITH D) 500-200 MG-UNIT per tablet Take 2 tablets by mouth daily.     . diphenhydrAMINE (SOMINEX) 25 MG tablet Take 25 mg by mouth at bedtime as needed. To help with sleeping    . escitalopram (LEXAPRO) 10 MG tablet Take 1 tablet (10 mg total) by mouth daily. 30 tablet 5  . folic acid (FOLVITE) 659 MCG tablet Take 400 mcg by mouth daily.      . metoprolol tartrate (LOPRESSOR) 25 MG tablet Take 25 mg by mouth daily.    . nitrofurantoin (MACRODANTIN) 100 MG capsule Take 100 mg by mouth 2 (two) times daily.    Marland Kitchen omeprazole (PRILOSEC) 20 MG capsule Take 20 mg by mouth daily.    Marland Kitchen HYDROcodone-acetaminophen (NORCO) 10-325 MG tablet Take 1 tablet by mouth every  6 (six) hours as needed. (Patient not taking: Reported on 01/17/2016) 10 tablet 0   No current facility-administered medications on file prior to visit.     PAST SURGICAL HISTORY Past Surgical History  Procedure Laterality Date  . Cataract extraction    . Cervical disc fusioni    . Retinal detachment and repair    . Btl    . Mastectomy modified radical    . Port-a-cath removal      in place (not removed)  . Biopsy of lymph node      super clavicle  . Breast surgery    . Lymph node biopsy Right 06/19/2015    Procedure: RIGHT NECK LYMPH NODE EXCISION;  Surgeon: Rolm Bookbinder, MD;  Location: [facility];  Service: General;  Laterality: Right;    FAMILY HISTORY: Family History  Problem Relation Age of Onset  . Hypertension Mother   . Heart disease Mother     atrial fib  . Heart disease Father   . Other  Father     glaucoma    SOCIAL HISTORY:  reports that she has never smoked. She has never used smokeless tobacco. She reports that she drinks about 4.2 oz of alcohol per week. She reports that she does not use illicit drugs.  Social History   Social History  . Marital Status: Divorced    Spouse Name: N/A  . Number of Children: N/A  . Years of Education: N/A   Social History Main Topics  . Smoking status: Never Smoker   . Smokeless tobacco: Never Used  . Alcohol Use: 4.2 oz/week    7 Glasses of wine per week  . Drug Use: No  . Sexual Activity: Not Asked   Other Topics Concern  . None   Social History Narrative    PERFORMANCE STATUS: The patient's performance status is 0 - Asymptomatic  PHYSICAL EXAM: Most Recent Vital Signs: Blood pressure 150/69, pulse 95, temperature 98.1 F (36.7 C), temperature source Oral, resp. rate 16, height '5\' 4"'  (1.626 m), weight 141 lb 6.4 oz (64.139 kg), SpO2 100 %. General appearance: alert, cooperative, appears stated age, no distress and smiling and unaccompanied Head: Normocephalic, without obvious abnormality,  atraumatic Eyes: negative findings: lids and lashes normal, conjunctivae and sclerae normal and corneas clear Throat: normal findings: oropharynx pink & moist without lesions or evidence of thrush Neck: no adenopathy, supple, symmetrical, trachea midline and thyroid not enlarged, symmetric, no tenderness/mass/nodules Lungs: clear to auscultation bilaterally Heart: regular rate and rhythm, S1, S2 normal, no murmur, click, rub or gallop Abdomen: soft, non-tender; bowel sounds normal; no masses,  no organomegaly Extremities: extremities normal, atraumatic, no cyanosis or edema and left UE lympedema sleeve in place Skin: Skin color, texture, turgor normal. No rashes or lesions Lymph nodes: Cervical, supraclavicular, and axillary nodes normal. Neurologic: Grossly normal  LABORATORY DATA:  No results found for this or any previous visit (from the past 48 hour(s)).           RADIOGRAPHY:  CLINICAL DATA: New diagnosis of anal cancer. Rectal pain. Left breast cancer 8 years ago with mastectomy. Status post chemotherapy and radiation therapy. History of bone metastasis.  EXAM: CT CHEST, ABDOMEN, AND PELVIS WITH CONTRAST  TECHNIQUE: Multidetector CT imaging of the chest, abdomen and pelvis was performed following the standard protocol during bolus administration of intravenous contrast.  CONTRAST: 164m ISOVUE-300 IOPAMIDOL (ISOVUE-300) INJECTION 61%  COMPARISON: PET 11/15/2015. Pelvic CT of 01/16/2016.  FINDINGS: CT CHEST FINDINGS  Mediastinum/Lymph Nodes: No supraclavicular adenopathy. A right Port-A-Cath which terminates at the low SVC. No axillary adenopathy. Left mastectomy and axillary node dissection. Mild cardiomegaly. No pericardial effusion. Lad coronary artery atherosclerosis. No central pulmonary embolism, on this non-dedicated study. No mediastinal or hilar adenopathy.  Dilated lower esophagus with fluid level within on image  45/series 2.  Lungs/Pleura: No pleural fluid. 5 mm right upper lobe nodular density on image 40/series 7 is similar to on the prior PET.  Anterior left upper lobe radiation fibrosis and pleural thickening.  Musculoskeletal: Remote lower bilateral rib trauma.  CT ABDOMEN PELVIS FINDINGS  Hepatobiliary: Normal liver. Normal gallbladder, without biliary ductal dilatation.  Pancreas: Normal, without mass or ductal dilatation.  Spleen: Normal in size, without focal abnormality.  Adrenals/Urinary Tract: Normal adrenal glands. Normal kidneys, without hydronephrosis. Normal urinary bladder.  Stomach/Bowel: Normal stomach, without wall thickening. No dominant anal rectal mass identified. Minimal motion degradation in the upper pelvis. Otherwise normal colon, terminal ileum, and appendix. Normal small bowel.  Vascular/Lymphatic: Aortic and branch vessel  atherosclerosis. No retroperitoneal or retrocrural adenopathy. No pelvic sidewall adenopathy. No perianal or perirectal adenopathy.  Reproductive: Normal uterus and adnexa. No adnexal mass.  Other: No significant free fluid. No evidence of omental or peritoneal disease.  Musculoskeletal: Right sided L1 vertebral body lesion measures 1.7 cm and is not significantly changed. Anterior L3 sclerotic lesion is similar, 2.8 cm.  IMPRESSION: 1. No anorectal primary identified. No evidence of soft tissue metastasis. 2. Left mastectomy and axillary node dissection. Similar sclerotic osseous lesions. 3. Esophageal air fluid level suggests dysmotility or gastroesophageal reflux. 4. Age advanced coronary artery atherosclerosis. Recommend assessment of coronary risk factors and consideration of medical therapy.   Electronically Signed  By: Abigail Miyamoto M.D.  On: 01/22/2016 13:34     PATHOLOGY:  N/A   ASSESSMENT/PLAN:  Excellent PS Squamous cell carcinoma of anus [facility])  Oncology history developed.  I do not  have pathology available to me. I reviewed Dr. Zachery Dauer most recent note which I received this morning reporting that the patient's case was discussed at cancer conference with Dr. Ledon Snare, Dr. Kyung Rudd, pathologist, and gastroenterologist.  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 It was originally reported to me that she has rectal cancer but based upon Dr. Jaclyn Prime most recent note, and the histology, anal squamous cell carcinoma makes sense.  Fortunately, at time of imaging with PET scan, there was no evidence of metastatic disease.  We agree with Dr. Jaclyn Prime recommendations regarding treatment. We will discuss the patient's case with Dr. Kyung Rudd and Dr. Leighton Ruff. We will plan on preserving the patient's bone marrow as much as possible and utilizing Xeloda with radiation therapy (rather than standard therapy with 5-FU and mitomycin-C). We will gain the benefit of breast cancer treatment with Xeloda could be continued post treatment. Will get formal results/opinion from tumor board for documentation.   HPV testing on the tumor was requested by Dr. Tressie Stalker. I have contacted [facility] pathology requesting that they ascertain the sample and provide [place] a report regarding pathology.  Patient will go to her trip to [place] as planned on 01/25/2016. In the interim, we will get Xeloda approved for her. Following her trip, she will return to clinic for chemotherapy teaching, port flush, laboratory work, and to start therapy. We will need to coordinate the start of Xeloda with radiation therapy. We will wait to hear from Dr. Lisbeth Renshaw regards to the start of radiation therapy.  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 BRCA2 POSITIVE.  History of stage IIIc left breast cancer in 2009 treated with neoadjuvant chemotherapy consisting of epirubicin and  Cytoxan followed by 4 cycles of docetaxel from 04/16/2008-08/13/2008 with the initiation of Herceptin for 52 weeks at the start of docetaxel. She then underwent a left mastectomy on 08/30/2008 showing persistent disease in the breast and lymph nodes. This was followed by radiation therapy from 09/17/2008-11/26/2008 to the left chest wall, supraclavicular, and axillary node areas. She then moved on to antiestrogen therapy with letrozole (11/27/2008-04/20/2011). She was found to have recurrent breast cancer on supraclavicular lymph node biopsy on 03/30/2011 leading to a change in antiestrogen therapy to tamoxifen (04/21/2011-06/04/2014). Pet imaging on 05/25/2014 demonstrated progression of left submandibular lymph node, mediastinal lymph node, and probable lung involvement. This was followed by a biopsy on 06/13/2014 of a left supraclavicular mass that did reveal metastatic carcinoma consistent with breast primary. She  was then treated with carboplatin and Taxol for 6 cycles with an excellent response (06/25/2014-10/09/2014). She was then transition back to hormone therapy consisting of Ibrance and Faslodex (11/13/2014-06/10/2015). This was complicated by grade 3 neutropenia with cycle #2 resulting in a dose reduction of Ibrance to 100 mg for cycle 3. Unfortunately, on 06/10/2015, she was noted to have progression of disease in the right mid cervical lymph node over a 4 day period that was PET avid. Therapy was therefore changed to exemestane and everolimus (01/65/5374-82/70/7867) that was complicated by an adverse reaction consisting of diffuse pneumonitis. This regimen was therefore discontinued. She then transitioned back to systemic chemotherapy consisting of cisplatin every 14 days (08/23/2015-12/23/2015). She was put on a drug holiday beginning on 12/23/2015 in preparation for a trip to [place] on 01/25/2016.  Her oncology history is developed.  Currently, she has developed an anal cancer that will require  local treatment consisting of chemoradiation. Treatment with Xeloda has been discussed and this can be utilized post local treatment for maintenance for her breast cancer.  She'll return in approximately 2 weeks' time for chemotherapy teaching and coordination to start Xeloda with radiation.  She has seen Dr. Mauro Kaufmann Muss in the past for second opinion.  I did briefly discuss CODE STATUS given her incurable disease. At this time, she wishes to be a FULL CODE.  She is not unrealistic with regards to the incurability of her breast cancer. Performance status is excellent. In the future, we will need to readdress this issue as she declines. Fortunately, from a breast cancer perspective, her options are becoming more limited. I am not sure to what degree she understands her options regarding therapy moving forward. Once her current therapy with regards to her anal carcinoma is completed we will need to actively discuss her breast cancer and treatment options moving forward.  Bone metastasis ([facility]) Bone metastases from breast cancer. On Xgeva therapy. She is also taking calcium and vitamin D.  Supportive therapy plan is built.  Next Xgeva injection is due on 02/12/2016.   ORDERS PLACED FOR THIS ENCOUNTER: Orders Placed This Encounter  Procedures  . CBC with Differential  . Comprehensive metabolic panel  . CEA  . Cancer antigen 27.29  . Full code   All questions were answered. The patient knows to call the clinic with any problems, questions or concerns. We can certainly see the patient much sooner if necessary.  This note is electronically signed by: Molli Hazard, MD   01/17/2016 7:44 PM

## 2016-01-17 NOTE — Patient Instructions (Signed)
[place] at [facility] Discharge Instructions  RECOMMENDATIONS MADE BY THE CONSULTANT AND ANY TEST RESULTS WILL BE SENT TO YOUR REFERRING PHYSICIAN.   XGEVA due July 5 - already scheduled  XELODA prescription will be written so we can get that process moving  We will get you set up for chemo teaching after your trip  We will send a message to Dr. Lisbeth Renshaw and Dr. Evelina Bucy and port flush in 2-3 weeks  Call us with any problems  Thank you for choosing [place] at [facility] to provide your oncology and hematology care.  To afford each patient quality time with our provider, please arrive at least 15 minutes before your scheduled appointment time.   Beginning January 23rd 2017 lab work for the [employer] [employer] will be done in the  Main lab at [facility] on 1st floor. If you have a lab appointment with the [facility] please come in thru the  Main Entrance and check in at the main information desk  You need to re-schedule your appointment should you arrive 10 or more minutes late.  We strive to give you quality time with our providers, and arriving late affects you and other patients whose appointments are after yours.  Also, if you no show three or more times for appointments you may be dismissed from the clinic at the providers discretion.     Again, thank you for choosing [facility].  Our hope is that these requests will decrease the amount of time that you wait before being seen by our physicians.       _____________________________________________________________  Should you have questions after your visit to [facility], please contact our office at [phone] between the hours of 8:30 a.m. and 4:30 p.m.  Voicemails left after 4:30 p.m. will not be returned until the following business day.  For prescription refill requests, have your pharmacy contact our office.         Resources For Cancer  Patients and their Caregivers ? American Cancer Society: Can assist with transportation, wigs, general needs, runs Look Good Feel Better.        (619) 296-0325 ? Cancer Care: Provides financial assistance, online support groups, medication/co-pay assistance.  1-800-813-HOPE 209-231-4088) ? [facility] Assists [place] cancer patients and their families through emotional , educational and financial support.  304 082 4541 ? [place] DSS Where to apply for food stamps, Medicaid and utility assistance. 219-718-4246 ? [employer]: Transportation to medical appointments. (330) 787-3891 ? Social Security Administration: May apply for disability if have a Stage IV cancer. 585-659-1990 865-398-6473 ? [employer], Disability and Transit Services: Assists with nutrition, care and transit needs. [place] Support Programs: @10RELATIVEDAYS @ > Cancer Support Group  2nd Tuesday of the month 1pm-2pm, Journey Room  > Creative Journey  3rd Tuesday of the month 1130am-1pm, Journey Room  > Look Good Feel Better  1st Wednesday of the month 10am-12 noon, Journey Room (Call [place] to register [phone])

## 2016-01-17 NOTE — Assessment & Plan Note (Signed)
Bone metastases from breast cancer. On Xgeva therapy. She is also taking calcium and vitamin D.  Supportive therapy plan is built.  Next Xgeva injection is due on 02/12/2016.

## 2016-01-21 ENCOUNTER — Other Ambulatory Visit (HOSPITAL_COMMUNITY): Payer: Self-pay | Admitting: Oncology

## 2016-01-21 ENCOUNTER — Telehealth (HOSPITAL_COMMUNITY): Payer: Self-pay

## 2016-01-21 DIAGNOSIS — C210 Malignant neoplasm of anus, unspecified: Secondary | ICD-10-CM

## 2016-01-21 MED ORDER — CAPECITABINE 500 MG PO TABS: ORAL_TABLET | ORAL | Status: DC

## 2016-01-21 MED ORDER — CAPECITABINE 150 MG PO TABS: ORAL_TABLET | ORAL | Status: DC

## 2016-01-21 NOTE — Telephone Encounter (Signed)
Called patient to let her know this. She is aware.

## 2016-01-21 NOTE — Telephone Encounter (Signed)
-----   Message from [facility], PA-C sent at 01/21/2016  1:08 PM EDT ----- Let patient know that Dr. Marcello Moores does want her to have CT scan as ordered.  TK

## 2016-01-22 ENCOUNTER — Telehealth (HOSPITAL_COMMUNITY): Payer: Self-pay | Admitting: Hematology & Oncology

## 2016-01-22 ENCOUNTER — Ambulatory Visit (HOSPITAL_COMMUNITY)
Admission: RE | Admit: 2016-01-22 | Discharge: 2016-01-22 | Disposition: A | Discharge status: Home / Self Care | Payer: BLUE CROSS/BLUE SHIELD | Source: Ambulatory Visit | Attending: General Surgery | Admitting: General Surgery

## 2016-01-22 DIAGNOSIS — I2510 Atherosclerotic heart disease of native coronary artery without angina pectoris: Secondary | ICD-10-CM | POA: Diagnosis not present

## 2016-01-22 DIAGNOSIS — Z9012 Acquired absence of left breast and nipple: Secondary | ICD-10-CM | POA: Insufficient documentation

## 2016-01-22 DIAGNOSIS — M899 Disorder of bone, unspecified: Secondary | ICD-10-CM | POA: Insufficient documentation

## 2016-01-22 DIAGNOSIS — C211 Malignant neoplasm of anal canal: Secondary | ICD-10-CM | POA: Diagnosis present

## 2016-01-22 MED ORDER — IOPAMIDOL (ISOVUE-300) INJECTION 61%
100.0000 mL | Freq: Once | INTRAVENOUS | Status: AC | PRN
  Administered 2016-01-22: 100 mL via INTRAVENOUS

## 2016-01-22 NOTE — Telephone Encounter (Signed)
FAXED XELODA SCRIPT TO [facility] °

## 2016-01-28 ENCOUNTER — Telehealth (HOSPITAL_COMMUNITY): Payer: Self-pay | Admitting: Hematology & Oncology

## 2016-01-28 NOTE — Telephone Encounter (Signed)
VERIFIED XELODA RX OVER THE PHONE WITH PHARMACIST

## 2016-02-07 ENCOUNTER — Encounter (HOSPITAL_BASED_OUTPATIENT_CLINIC_OR_DEPARTMENT_OTHER): Payer: BLUE CROSS/BLUE SHIELD | Admitting: Hematology & Oncology

## 2016-02-07 ENCOUNTER — Encounter (HOSPITAL_COMMUNITY): Payer: Self-pay | Admitting: Hematology & Oncology

## 2016-02-07 ENCOUNTER — Encounter (HOSPITAL_COMMUNITY): Payer: Self-pay | Admitting: Oncology

## 2016-02-07 VITALS — BP 150/63 | HR 89 | Temp 98.2°F | Resp 16 | Wt 143.6 lb

## 2016-02-07 DIAGNOSIS — Z1501 Genetic susceptibility to malignant neoplasm of breast: Secondary | ICD-10-CM

## 2016-02-07 DIAGNOSIS — C7951 Secondary malignant neoplasm of bone: Secondary | ICD-10-CM

## 2016-02-07 DIAGNOSIS — C50912 Malignant neoplasm of unspecified site of left female breast: Secondary | ICD-10-CM

## 2016-02-07 DIAGNOSIS — C20 Malignant neoplasm of rectum: Secondary | ICD-10-CM | POA: Diagnosis not present

## 2016-02-07 DIAGNOSIS — Z1509 Genetic susceptibility to other malignant neoplasm: Secondary | ICD-10-CM

## 2016-02-07 DIAGNOSIS — Z170 Estrogen receptor positive status [ER+]: Secondary | ICD-10-CM

## 2016-02-07 DIAGNOSIS — Z95828 Presence of other vascular implants and grafts: Secondary | ICD-10-CM

## 2016-02-07 NOTE — Progress Notes (Signed)
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 female with a medical history significant for Stage IV ER/PR+ breast cancer with initial diagnosis showing HER2 POSITIVITY initially diagnosed with Stage IIIC disease in 2012 and treated curative intent with metastatic disease discovered in October 2015 and osseous involvement noted on PET imaging on 09/10/2014, who is referred to the [facility] for transfer of care with Stage IV breast cancer and newly diagnosed squamous cell carcinoma of anus.    Adenocarcinoma of left breast   04/09/2008 Pathology Results Positive for breast cancer, ER 100%, PR 5%, HER-2/neu POSITIVE.   04/09/2008 Procedure Biopsy of left axillary node mass    04/16/2008 - 08/13/2008 Chemotherapy Dose dense epirubicin/Cytoxan 4 cycles followed by docetaxel 4 cycles. Herceptin initiated with docetaxel for a total of 52 weeks.   08/30/2008 Definitive Surgery Left mastectomy revealing persistence of disease in the breast and lymph nodes.   09/17/2008 - 11/26/2008 Radiation Therapy Radiation to left chest wall, supraclavicular, and axillary nodal areas.   11/27/2008 - 04/20/2011 Anti-estrogen oral therapy Letrozole   03/30/2011 Procedure Left supraclavicular lymph node biopsy   03/30/2011 Pathology Results Recurrent breast cancer, invasive ductal type, ER 100%, PR 40%, HER-2 NEGATIVE   04/21/2011 - 06/04/2014 Anti-estrogen oral therapy Tamoxifen   05/25/2014 PET scan Progression in left submandibular lymph node, mediastinal lymph  nodes, and probable lung involvement.   05/25/2014 Progression PET scan demonstrates progression of disease.   06/13/2014 Procedure Left supraclavicular mass biopsy   06/13/2014 Pathology Results Metastatic carcinoma consistent with breast primary, involving skeletal muscle, perineural invasion, LVI, no lymphoid tissue identified, ER 98%, PR negative, HER-2 negative   06/25/2014 - 10/09/2014 Chemotherapy Carboplatin/Taxol 6 cycles with excellent response   11/06/2014 - 06/10/2015 Chemotherapy Palbociclib and Faslodex   01/08/2015 Adverse Reaction Grade 3 neutropenia with cycle #2 of Palbociclib.   01/08/2015 Treatment Plan Change Palbociclib dose reduced to 100 mg for cycle #3.   06/10/2015 Progression Right cervical lymph node progression clinically developed over 4 days.  This lymph node was positive on PET scan in September 2016 and at that time was approximately 8 mm with possible progression in a single bone metastasis.   06/17/2015 Procedure Biopsy of right cervical lymph node   06/17/2015 Pathology Results Genetic sequencing performed revealing BRCA2 positivity   07/02/2015 - 08/01/2015 Chemotherapy Exemestane/everolimus, 25 mg/ 5 mg x 7 days then increasing to 10 mg daily.   08/01/2015 PET scan Diffuse pneumonitis.     08/01/2015 Adverse Reaction Pneumonitis secondary to drug reaction from everolimus.   08/23/2015 - 12/23/2015 Chemotherapy Cisplatin 40 mg/m every other week   11/15/2015 PET scan Interval resolution of hypermetabolic airspace opacities in the lungs which were likely inflammatory. Abnormal high activity at the anal rectal junction. Very low grade residual activity at the site of L3 metastatic lesion.   12/23/2015 Treatment Plan Change Holiday from treatment in preparation for trip to [place].    Squamous cell carcinoma of anus ([place])   12/31/2015 Procedure  Colonoscopy by Dr. Britta Mccreedy   12/31/2015 Pathology Results Invasive squamous cell carcinoma approximate 4 cm mass posterior wall of  rectum   01/13/2016 Treatment Plan Change Dr. Leighton Ruff consultation- no surgery.  Recommend chemoXRT    Chart is reviewed. Oncology history is developed. Staging is completed. All dictations are reviewed.   Newly diagnosed squamous cell rectal cancer has been evaluated by a radiation therapy standpoint. He saw Dr. Lisbeth Renshaw on 01/10/2016. I have his note available and this is reviewed:  One of Dr. Jaclyn Prime primary concern is that of attempting to spare the patient's bone marrow reserve is to maintain the ability to tolerate chemotherapy given that the dominant life threatening issue remains her metastatic breast cancer. However, the patient is symptomatic from her rectal cancer and this also needs to be addressed with local therapy. The patient is seeing Dr. Marcello Moores to discuss possible surgery. If this is felt to be warranted as initial treatment I believe this would be reasonable and I would not street feel strongly about preceding this with radiation treatment or chemoradiation treatment. However, if the patient will not undergo surgery after this discussion, and I would recommend palliative radiation treatment to the pelvis. This could be done in a focal manner to a greater extent than normal for rectal cancer and sparing of the bone marrow could be achieved to a much greater extent than a traditional rectal cancer plan. The patient's treatment could consist of traditional 5-1/2 week course of treatment or a shorter course of treatment such as a two-week course. One additional alternative would involve 5 Gy5 fraction as is often more commonly done in [place], which also may be a good short course option for the patient. With the diagnosis of squamous cell cancer, the patient may have a very radiosensitive tumor if she does require palliative radiation therapy.  All of this was discussed with the patient. We will follow up on her discussion with Dr. Marcello Moores on Monday and then proceed accordingly. The  patient could undergo simulation in a week on Thursday if this appears appropriate. All the patient's questions were answered today and she states that she is comfortable with this plan.  The patient reports that she did meet with Dr. Leighton Ruff on Monday, 01/13/2016. She notes that at this point in time is recommended that she pursue chemoradiation with Capecitabine.  [name] is unaccompanied and wearing a lymphedema sleeve on her left arm. I reviewed potential side effects and symptoms to look out for with treatment.  She got her Xeloda last night and waited to take her first dose until this morning.   She reports diarrhea. This is her normal. She will let us know if this worsens.  She was curious how much alcohol intake is allowed with treatment, especially with July 4th approaching.   She would like to be seen by cardiologist, Dr. Tamala Julian, in [place] as he was both of her parents' cardiologist.   Admits she previously had a toxic reaction to [employer].  She had a great vacation in [place]. Appetite is excellent. Physical activity is excellent.    Review of Systems  Constitutional: Negative.  Negative for fever, chills and weight loss.  HENT: Negative.  Negative for hearing loss and tinnitus.   Eyes: Negative.  Negative for blurred vision and double vision.  Respiratory: Negative.  Negative for cough, hemoptysis, sputum production, shortness of breath and wheezing.   Cardiovascular: Negative.  Negative for chest pain.  Gastrointestinal: Positive for diarrhea (Loose stools, multiple  times per day.). Negative for nausea, vomiting, abdominal pain, constipation, blood in stool and melena.  Genitourinary: Negative.  Negative for dysuria, urgency, frequency and hematuria.  Musculoskeletal: Negative.  Negative for myalgias, back pain, joint pain and falls.  Skin: Negative.   Neurological: Negative.  Negative for dizziness, sensory change, speech change, focal weakness, seizures, loss of  consciousness, weakness and headaches.  Endo/Heme/Allergies: Negative.   Psychiatric/Behavioral: Negative.   14 point review of systems was performed and is negative except as detailed under history of present illness and above  PAST MEDICAL HISTORY:   Past Medical History  Diagnosis Date  . Osteoporosis 01/27/2011  . Borderline hypertension   . Pneumonia   . Bladder infection   . Fatigue   . Generalized headaches   . Wears glasses   . Swollen lymph nodes   . Retinal detachment   . Reflux   . URI (upper respiratory infection) 02/17/12  . Adenocarcinoma of breast ([place]) 01/27/2011    stage IIIc breast cancer  . Breast cancer ([place])   . Post-mastectomy lymphedema syndrome   . Closed fracture of humerus sept 2013  . Port catheter in place 04/14/2013  . 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 Bone metastasis ([place]) 01/17/2016  . 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 Recurrent UTI 01/17/2016    ALLERGIES: Allergies  Allergen Reactions  . Ciprofloxacin Other (See Comments)    tendonitis      MEDICATIONS: I have reviewed the patient's current medications.    Current Outpatient Prescriptions on File Prior to Visit  Medication Sig Dispense Refill  . calcium-vitamin D (OSCAL WITH D) 500-200 MG-UNIT per tablet Take 2 tablets by mouth daily.     . capecitabine (XELODA) 150 MG tablet Take 1450 mg PO BID Sunday PM- Friday AM during XRT 180 tablet 0  . capecitabine (XELODA) 500 MG tablet Take 1450 mg PO BID Sunday PM- Friday AM during XRT 120 tablet 0  . diphenhydrAMINE (SOMINEX) 25 MG tablet Take 25 mg by mouth at bedtime as needed. To help with sleeping    . escitalopram (LEXAPRO) 10 MG tablet Take 1 tablet (10 mg total) by mouth daily. 30 tablet 5  . folic acid (FOLVITE) 443 MCG tablet Take 400 mcg by mouth daily.      Marland Kitchen HYDROcodone-acetaminophen (NORCO) 10-325 MG tablet Take 1 tablet by mouth every 6 (six) hours as needed. (Patient not taking: Reported on 01/17/2016) 10  tablet 0  . ibuprofen (ADVIL,MOTRIN) 400 MG tablet Take 400 mg by mouth every 4 (four) hours as needed for mild pain.    . magnesium oxide (MAG-OX) 400 MG tablet Take 400 mg by mouth 2 (two) times daily.    . metoprolol tartrate (LOPRESSOR) 25 MG tablet Take 25 mg by mouth daily.    . nitrofurantoin (MACRODANTIN) 100 MG capsule Take 100 mg by mouth 2 (two) times daily.    Marland Kitchen omeprazole (PRILOSEC) 20 MG capsule Take 20 mg by mouth daily.    . ondansetron (ZOFRAN) 8 MG tablet Take 8 mg by mouth every 8 (eight) hours as needed for nausea or vomiting.     No current facility-administered medications on file prior to visit.     PAST SURGICAL HISTORY Past Surgical History  Procedure Laterality Date  . Cataract extraction    . Cervical disc fusioni    . Retinal detachment and repair    . Btl    . Mastectomy modified radical    .  Port-a-cath removal      in place (not removed)  . Biopsy of lymph node      super clavicle  . Breast surgery    . Lymph node biopsy Right 06/19/2015    Procedure: RIGHT NECK LYMPH NODE EXCISION;  Surgeon: Rolm Bookbinder, MD;  Location: [facility];  Service: General;  Laterality: Right;    FAMILY HISTORY: Family History  Problem Relation Age of Onset  . Hypertension Mother   . Heart disease Mother     atrial fib  . Heart disease Father   . Other Father     glaucoma    SOCIAL HISTORY:  reports that she has never smoked. She has never used smokeless tobacco. She reports that she drinks about 4.2 oz of alcohol per week. She reports that she does not use illicit drugs.  Social History   Social History  . Marital Status: Divorced    Spouse Name: N/A  . Number of Children: N/A  . Years of Education: N/A   Social History Main Topics  . Smoking status: Never Smoker   . Smokeless tobacco: Never Used  . Alcohol Use: 4.2 oz/week    7 Glasses of wine per week  . Drug Use: No  . Sexual Activity: Not on file   Other Topics Concern  . Not  on file   Social History Narrative    PERFORMANCE STATUS: The patient's performance status is 0 - Asymptomatic  PHYSICAL EXAM: Most Recent Vital Signs: Blood pressure 150/63, pulse 89, temperature 98.2 F (36.8 C), resp. rate 16, weight 143 lb 9.6 oz (65.137 kg), SpO2 99 %. General appearance: alert, cooperative, appears stated age, no distress and smiling and unaccompanied. Lymphedema sleeve on left upper extremity. Wears glasses. Head: Normocephalic, without obvious abnormality, atraumatic Eyes: negative findings: lids and lashes normal, conjunctivae and sclerae normal and corneas clear Throat: normal findings: oropharynx pink & moist without lesions or evidence of thrush Neck: no adenopathy, supple, symmetrical, trachea midline and thyroid not enlarged, symmetric, no tenderness/mass/nodules Lungs: clear to auscultation bilaterally Heart: regular rate and rhythm, S1, S2 normal, no murmur, click, rub or gallop Abdomen: soft, non-tender; bowel sounds normal; no masses,  no organomegaly Extremities: extremities normal, atraumatic, no cyanosis or edema and left UE lympedema sleeve in place Skin: Skin color, texture, turgor normal. No rashes or lesions Lymph nodes: Cervical, supraclavicular, and axillary nodes normal. Neurologic: Grossly normal  LABORATORY DATA:        RADIOGRAPHY:  CLINICAL DATA: New diagnosis of anal cancer. Rectal pain. Left breast cancer 8 years ago with mastectomy. Status post chemotherapy and radiation therapy. History of bone metastasis.  EXAM: CT CHEST, ABDOMEN, AND PELVIS WITH CONTRAST  TECHNIQUE: Multidetector CT imaging of the chest, abdomen and pelvis was performed following the standard protocol during bolus administration of intravenous contrast.  CONTRAST: 132m ISOVUE-300 IOPAMIDOL (ISOVUE-300) INJECTION 61%  COMPARISON: PET 11/15/2015. Pelvic CT of 01/16/2016.  FINDINGS: CT CHEST FINDINGS  Mediastinum/Lymph Nodes: No  supraclavicular adenopathy. A right Port-A-Cath which terminates at the low SVC. No axillary adenopathy. Left mastectomy and axillary node dissection. Mild cardiomegaly. No pericardial effusion. Lad coronary artery atherosclerosis. No central pulmonary embolism, on this non-dedicated study. No mediastinal or hilar adenopathy.  Dilated lower esophagus with fluid level within on image 45/series 2.  Lungs/Pleura: No pleural fluid. 5 mm right upper lobe nodular density on image 40/series 7 is similar to on the prior PET.  Anterior left upper lobe radiation fibrosis and pleural thickening.  Musculoskeletal: Remote lower bilateral rib trauma.  CT ABDOMEN PELVIS FINDINGS  Hepatobiliary: Normal liver. Normal gallbladder, without biliary ductal dilatation.  Pancreas: Normal, without mass or ductal dilatation.  Spleen: Normal in size, without focal abnormality.  Adrenals/Urinary Tract: Normal adrenal glands. Normal kidneys, without hydronephrosis. Normal urinary bladder.  Stomach/Bowel: Normal stomach, without wall thickening. No dominant anal rectal mass identified. Minimal motion degradation in the upper pelvis. Otherwise normal colon, terminal ileum, and appendix. Normal small bowel.  Vascular/Lymphatic: Aortic and branch vessel atherosclerosis. No retroperitoneal or retrocrural adenopathy. No pelvic sidewall adenopathy. No perianal or perirectal adenopathy.  Reproductive: Normal uterus and adnexa. No adnexal mass.  Other: No significant free fluid. No evidence of omental or peritoneal disease.  Musculoskeletal: Right sided L1 vertebral body lesion measures 1.7 cm and is not significantly changed. Anterior L3 sclerotic lesion is similar, 2.8 cm.  IMPRESSION: 1. No anorectal primary identified. No evidence of soft tissue metastasis. 2. Left mastectomy and axillary node dissection. Similar sclerotic osseous lesions. 3. Esophageal air fluid level suggests  dysmotility or gastroesophageal reflux. 4. Age advanced coronary artery atherosclerosis. Recommend assessment of coronary risk factors and consideration of medical therapy.   Electronically Signed  By: Abigail Miyamoto M.D.  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 She is going to begin XELODA today.  I reviewed side effects of concern including worsening baseline diarrhea, nausea, vomiting, abdominal pain, mouth sores, sore red or tender hands or feet. She knows to stop the drug and call with any of these symptoms.   Weekly labs will be scheduled with weekly follow-up.   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 BRCA2 POSITIVE.  History of stage IIIc left breast cancer in 2009 treated with neoadjuvant chemotherapy consisting of epirubicin and Cytoxan followed by 4 cycles of docetaxel from 04/16/2008-08/13/2008 with the initiation of Herceptin for 52 weeks at the start of docetaxel. She then underwent a left mastectomy on 08/30/2008 showing persistent disease in the breast and lymph nodes. This was followed by radiation therapy from 09/17/2008-11/26/2008 to the left chest wall, supraclavicular, and axillary node areas. She then moved on to antiestrogen therapy with letrozole (11/27/2008-04/20/2011). She was found to have recurrent breast cancer on supraclavicular lymph node biopsy on 03/30/2011 leading to a change in antiestrogen therapy to tamoxifen (04/21/2011-06/04/2014). Pet imaging on 05/25/2014 demonstrated progression of left submandibular lymph node, mediastinal lymph node, and probable lung involvement. This was followed by a biopsy on 06/13/2014 of a left supraclavicular mass that did reveal metastatic carcinoma consistent  with breast primary. She was then treated with carboplatin and Taxol for 6 cycles with an excellent response (06/25/2014-10/09/2014). She was then transition back to hormone therapy consisting of Ibrance and Faslodex (11/13/2014-06/10/2015). This was complicated by grade 3 neutropenia with cycle #2 resulting in a dose reduction of Ibrance to 100 mg for cycle 3. Unfortunately, on 06/10/2015, she was noted to have progression of disease in the right mid cervical lymph node over a 4 day period that was PET avid. Therapy was therefore changed to exemestane and everolimus (87/56/4332-95/18/8416) that was complicated by an adverse reaction consisting of diffuse pneumonitis. This regimen was therefore discontinued. She then transitioned back to systemic chemotherapy consisting of cisplatin every 14 days (08/23/2015-12/23/2015). She was put on a  drug holiday beginning on 12/23/2015 in preparation for a trip to [place] on 01/25/2016.  Her oncology history is developed.  Treatment with Xeloda has been discussed and this can be utilized post local treatment for maintenance for her breast cancer.  She has seen Dr. Mauro Kaufmann Muss in the past for second opinion.  I did briefly discuss CODE STATUS given her incurable disease. At this time, she wishes to be a FULL CODE.  She is not unrealistic with regards to the incurability of her breast cancer. Performance status is excellent. In the future, we will need to readdress this issue as she declines. Fortunately, from a breast cancer perspective, her options are becoming more limited. I am not sure to what degree she understands her options regarding therapy moving forward. Once her current therapy with regards to her anal carcinoma is completed we will need to actively discuss her breast cancer and treatment options moving forward.  Bone metastasis (HCC) Bone metastases from breast cancer. On Xgeva therapy. She is also taking calcium and vitamin D.  Supportive therapy plan  is built.  Next Xgeva injection is due on 02/12/2016.   She does not need any refills at this time.   She will return in 1 week for follow up, reassessment, and blood work.  All questions were answered. The patient knows to call the clinic with any problems, questions or concerns. We can certainly see the patient much sooner if necessary.  This document serves as a record of services personally performed by Ancil Linsey, MD. It was created on her behalf by Arlyce Harman, a trained medical scribe. The creation of this record is based on the scribe's personal observations and the provider's statements to them. This document has been checked and approved by the attending provider.  I have reviewed the above documentation for accuracy and completeness, and I agree with the above.  This note is electronically signed by: Molli Hazard, MD   02/07/2016 8:49 AM

## 2016-02-07 NOTE — Patient Instructions (Addendum)
[place] at [facility] Discharge Instructions  RECOMMENDATIONS MADE BY THE CONSULTANT AND ANY TEST RESULTS WILL BE SENT TO YOUR REFERRING PHYSICIAN.  Exam done and seen today by Dr. Gustavus Bryant with next appointment Return to see the doctor as sheduled Please call the clinic if you have any questions or concerns  Thank you for choosing [place] at [facility] to provide your oncology and hematology care.  To afford each patient quality time with our provider, please arrive at least 15 minutes before your scheduled appointment time.   Beginning January 23rd 2017 lab work for the [facility] will be done in the  Main lab at [facility] on 1st floor. If you have a lab appointment with the [facility] please come in thru the  Main Entrance and check in at the main information desk  You need to re-schedule your appointment should you arrive 10 or more minutes late.  We strive to give you quality time with our providers, and arriving late affects you and other patients whose appointments are after yours.  Also, if you no show three or more times for appointments you may be dismissed from the clinic at the providers discretion.     Again, thank you for choosing [facility].  Our hope is that these requests will decrease the amount of time that you wait before being seen by our physicians.       _____________________________________________________________  Should you have questions after your visit to [facility], please contact our office at [phone] between the hours of 8:30 a.m. and 4:30 p.m.  Voicemails left after 4:30 p.m. will not be returned until the following business day.  For prescription refill requests, have your pharmacy contact our office.         Resources For Cancer Patients and their Caregivers ? American Cancer Society: Can assist with transportation, wigs, general needs, runs Look Good  Feel Better.        (573)689-8439 ? Cancer Care: Provides financial assistance, online support groups, medication/co-pay assistance.  1-800-813-HOPE (934) 859-6805) ? [place] Assists [place] cancer patients and their families through emotional , educational and financial support.  810-254-1594 ? [place] DSS Where to apply for food stamps, Medicaid and utility assistance. 9171125595 ? [facility]: Transportation to medical appointments. 937-356-6428 ? Social Security Administration: May apply for disability if have a Stage IV cancer. (226)152-1013 (781)524-8901 ? [employer], Disability and Transit Services: Assists with nutrition, care and transit needs. [place] Support Programs: @10RELATIVEDAYS @ > Cancer Support Group  2nd Tuesday of the month 1pm-2pm, Journey Room  > Creative Journey  3rd Tuesday of the month 1130am-1pm, Journey Room  > Look Good Feel Better  1st Wednesday of the month 10am-12 noon, Journey Room (Call [place] to register [phone])

## 2016-02-12 ENCOUNTER — Ambulatory Visit (HOSPITAL_COMMUNITY): Payer: BLUE CROSS/BLUE SHIELD

## 2016-02-12 ENCOUNTER — Other Ambulatory Visit (HOSPITAL_COMMUNITY): Payer: BLUE CROSS/BLUE SHIELD

## 2016-02-13 ENCOUNTER — Other Ambulatory Visit: Payer: Self-pay | Admitting: Cardiology

## 2016-02-13 ENCOUNTER — Other Ambulatory Visit (HOSPITAL_COMMUNITY): Payer: BLUE CROSS/BLUE SHIELD

## 2016-02-13 ENCOUNTER — Ambulatory Visit (HOSPITAL_COMMUNITY): Payer: BLUE CROSS/BLUE SHIELD | Admitting: Hematology & Oncology

## 2016-02-13 DIAGNOSIS — I2510 Atherosclerotic heart disease of native coronary artery without angina pectoris: Secondary | ICD-10-CM

## 2016-02-14 ENCOUNTER — Encounter (HOSPITAL_COMMUNITY): Payer: BLUE CROSS/BLUE SHIELD

## 2016-02-14 ENCOUNTER — Encounter (HOSPITAL_BASED_OUTPATIENT_CLINIC_OR_DEPARTMENT_OTHER): Payer: BLUE CROSS/BLUE SHIELD | Admitting: Hematology & Oncology

## 2016-02-14 ENCOUNTER — Encounter (HOSPITAL_COMMUNITY): Payer: BLUE CROSS/BLUE SHIELD | Attending: Oncology

## 2016-02-14 ENCOUNTER — Encounter (HOSPITAL_COMMUNITY): Payer: Self-pay | Admitting: Hematology & Oncology

## 2016-02-14 ENCOUNTER — Other Ambulatory Visit (HOSPITAL_COMMUNITY): Payer: BLUE CROSS/BLUE SHIELD

## 2016-02-14 VITALS — BP 156/52 | HR 80 | Temp 98.6°F | Resp 18 | Wt 142.6 lb

## 2016-02-14 DIAGNOSIS — C50912 Malignant neoplasm of unspecified site of left female breast: Secondary | ICD-10-CM | POA: Insufficient documentation

## 2016-02-14 DIAGNOSIS — Z1509 Genetic susceptibility to other malignant neoplasm: Secondary | ICD-10-CM

## 2016-02-14 DIAGNOSIS — K5903 Drug induced constipation: Secondary | ICD-10-CM

## 2016-02-14 DIAGNOSIS — Z170 Estrogen receptor positive status [ER+]: Secondary | ICD-10-CM

## 2016-02-14 DIAGNOSIS — Z95828 Presence of other vascular implants and grafts: Secondary | ICD-10-CM | POA: Insufficient documentation

## 2016-02-14 DIAGNOSIS — C7951 Secondary malignant neoplasm of bone: Secondary | ICD-10-CM | POA: Insufficient documentation

## 2016-02-14 DIAGNOSIS — C210 Malignant neoplasm of anus, unspecified: Secondary | ICD-10-CM | POA: Diagnosis not present

## 2016-02-14 DIAGNOSIS — T402X5A Adverse effect of other opioids, initial encounter: Secondary | ICD-10-CM

## 2016-02-14 DIAGNOSIS — C20 Malignant neoplasm of rectum: Secondary | ICD-10-CM | POA: Insufficient documentation

## 2016-02-14 DIAGNOSIS — G893 Neoplasm related pain (acute) (chronic): Secondary | ICD-10-CM

## 2016-02-14 DIAGNOSIS — Z1501 Genetic susceptibility to malignant neoplasm of breast: Secondary | ICD-10-CM

## 2016-02-14 LAB — COMPREHENSIVE METABOLIC PANEL
ALT: 23 U/L (ref 14–54)
AST: 27 U/L (ref 15–41)
Albumin: 3.9 g/dL (ref 3.5–5.0)
Alkaline Phosphatase: 55 U/L (ref 38–126)
Anion gap: 5 (ref 5–15)
BUN: 17 mg/dL (ref 6–20)
CO2: 26 mmol/L (ref 22–32)
Calcium: 9.1 mg/dL (ref 8.9–10.3)
Chloride: 104 mmol/L (ref 101–111)
Creatinine, Ser: 0.99 mg/dL (ref 0.44–1.00)
GFR calc Af Amer: >60 mL/min (ref >60)
GFR calc non Af Amer: 59 mL/min — ABNORMAL LOW (ref >60)
Glucose, Bld: 132 mg/dL — ABNORMAL HIGH (ref 65–99)
Potassium: 3.9 mmol/L (ref 3.5–5.1)
Sodium: 135 mmol/L (ref 135–145)
Total Bilirubin: 0.6 mg/dL (ref 0.3–1.2)
Total Protein: 6.5 g/dL (ref 6.5–8.1)

## 2016-02-14 LAB — CBC WITH DIFFERENTIAL/PLATELET
Basophils Absolute: 0 10*3/uL (ref 0.0–0.1)
Basophils Relative: 0 %
Eosinophils Absolute: 0 10*3/uL (ref 0.0–0.7)
Eosinophils Relative: 0 %
HCT: 31.2 % — ABNORMAL LOW (ref 36.0–46.0)
Hemoglobin: 10.5 g/dL — ABNORMAL LOW (ref 12.0–15.0)
Lymphocytes Relative: 20 %
Lymphs Abs: 1.1 10*3/uL (ref 0.7–4.0)
MCH: 38.6 pg — ABNORMAL HIGH (ref 26.0–34.0)
MCHC: 33.7 g/dL (ref 30.0–36.0)
MCV: 114.7 fL — ABNORMAL HIGH (ref 78.0–100.0)
Monocytes Absolute: 0.4 10*3/uL (ref 0.1–1.0)
Monocytes Relative: 7 %
Neutro Abs: 3.7 10*3/uL (ref 1.7–7.7)
Neutrophils Relative %: 72 %
Platelets: 230 10*3/uL (ref 150–400)
RBC: 2.72 MIL/uL — ABNORMAL LOW (ref 3.87–5.11)
RDW: 12.4 % (ref 11.5–15.5)
WBC: 5.2 10*3/uL (ref 4.0–10.5)

## 2016-02-14 MED ORDER — FENTANYL 12 MCG/HR TD PT72: 12.0000 ug | MEDICATED_PATCH | TRANSDERMAL | Status: DC

## 2016-02-14 MED ORDER — ATORVASTATIN CALCIUM 10 MG PO TABS: 10.0000 mg | ORAL_TABLET | Freq: Every day | ORAL | Status: DC

## 2016-02-14 MED ORDER — TRAMADOL HCL 50 MG PO TABS: ORAL_TABLET | ORAL | Status: DC

## 2016-02-14 NOTE — Patient Instructions (Addendum)
[place] Discharge Instructions  RECOMMENDATIONS MADE BY THE CONSULTANT AND ANY TEST RESULTS WILL BE SENT TO YOUR REFERRING PHYSICIAN.  You were seen by Dr. Whitney Muse today. Return to clinic next week for follow-up appointment, Port flush and lab work.  We will draw labs through your port. Call clinic with any questions or concerns.   SPECIAL INSTRUCTIONS/FOLLOW-UP:  Start with one fentanyl patch.  If within 36 hours you cannot tell a difference in your pain control, remove it and place two patches. If within 36 hours you still cannot tell a difference in your pian control remove them and place 3 patches. You can use your tramadol or hydrocodone as needed.   It is common for patients who are undergoing treatment and taking certain prescribed medications to experience side-effects with constipation. MIRALAX is good for you to start with. Take one capful in 8 oz fluid daily, can increase to twice daily if needed. You may also add a stimulant laxative such as senokot up to 4 tablets twice daily. Please do not get constipated. Call if you need additional help with this.       Please call prior to your next visit with any problems or concerns. We will see you back again in                    with labs and an office visit. We have called in the following prescriptions: We have refilled the following prescriptions:    Thank you for choosing [place] to provide your oncology and hematology care.  To afford each patient quality time with our providers, please arrive at least 15 minutes before your scheduled appointment time.  With your help, our goal is to use those 15 minutes to complete the necessary work-up to ensure our physicians have the information they need to help with your evaluation and healthcare recommendations.    Effective January 1st, 2014, we ask that you re-schedule your appointment with our physicians should you arrive 10 or more minutes late  for your appointment.  We strive to give you quality time with our providers, and arriving late affects you and other patients whose appointments are after yours.    Again, thank you for choosing [facility].  Our hope is that these requests will decrease the amount of time that you wait before being seen by our physicians.       _____________________________________________________________  Should you have questions after your visit to [facility], please contact our office at [phone] between the hours of 8:30 a.m. and 5:00 p.m.  Voicemails left after 4:30 p.m. will not be returned until the following business day.  For prescription refill requests, have your pharmacy contact our office with your prescription refill request.

## 2016-02-14 NOTE — Progress Notes (Signed)
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 female with a medical history significant for Stage IV ER/PR+ breast cancer with initial diagnosis showing HER2 POSITIVITY initially diagnosed with Stage IIIC disease in 2012 and treated curative intent with metastatic disease discovered in October 2015 and osseous involvement noted on PET imaging on 09/10/2014, who is referred to the [facility] for transfer of care with Stage IV breast cancer and newly diagnosed squamous cell carcinoma of anus.    Adenocarcinoma of left breast   04/09/2008 Pathology Results Positive for breast cancer, ER 100%, PR 5%, HER-2/neu POSITIVE.   04/09/2008 Procedure Biopsy of left axillary node mass    04/16/2008 - 08/13/2008 Chemotherapy Dose dense epirubicin/Cytoxan 4 cycles followed by docetaxel 4 cycles. Herceptin initiated with docetaxel for a total of 52 weeks.   08/30/2008 Definitive Surgery Left mastectomy revealing persistence of disease in the breast and lymph nodes.   09/17/2008 - 11/26/2008 Radiation Therapy Radiation to left chest wall, supraclavicular, and axillary nodal areas.   11/27/2008 - 04/20/2011 Anti-estrogen oral therapy Letrozole   03/30/2011 Procedure Left supraclavicular lymph node biopsy   03/30/2011 Pathology Results Recurrent breast cancer, invasive ductal type, ER 100%, PR 40%, HER-2 NEGATIVE   04/21/2011 - 06/04/2014 Anti-estrogen oral therapy Tamoxifen   05/25/2014 PET scan Progression in left submandibular lymph node, mediastinal lymph  nodes, and probable lung involvement.   05/25/2014 Progression PET scan demonstrates progression of disease.   06/13/2014 Procedure Left supraclavicular mass biopsy   06/13/2014 Pathology Results Metastatic carcinoma consistent with breast primary, involving skeletal muscle, perineural invasion, LVI, no lymphoid tissue identified, ER 98%, PR negative, HER-2 negative   06/25/2014 - 10/09/2014 Chemotherapy Carboplatin/Taxol 6 cycles with excellent response   11/06/2014 - 06/10/2015 Chemotherapy Palbociclib and Faslodex   01/08/2015 Adverse Reaction Grade 3 neutropenia with cycle #2 of Palbociclib.   01/08/2015 Treatment Plan Change Palbociclib dose reduced to 100 mg for cycle #3.   06/10/2015 Progression Right cervical lymph node progression clinically developed over 4 days.  This lymph node was positive on PET scan in September 2016 and at that time was approximately 8 mm with possible progression in a single bone metastasis.   06/17/2015 Procedure Biopsy of right cervical lymph node   06/17/2015 Pathology Results Genetic sequencing performed revealing BRCA2 positivity   07/02/2015 - 08/01/2015 Chemotherapy Exemestane/everolimus, 25 mg/ 5 mg x 7 days then increasing to 10 mg daily.   08/01/2015 PET scan Diffuse pneumonitis.     08/01/2015 Adverse Reaction Pneumonitis secondary to drug reaction from everolimus.   08/23/2015 - 12/23/2015 Chemotherapy Cisplatin 40 mg/m every other week   11/15/2015 PET scan Interval resolution of hypermetabolic airspace opacities in the lungs which were likely inflammatory. Abnormal high activity at the anal rectal junction. Very low grade residual activity at the site of L3 metastatic lesion.   12/23/2015 Treatment Plan Change Holiday from treatment in preparation for trip to [place].    Squamous cell carcinoma of anus ([place])   12/31/2015 Procedure  Colonoscopy by Dr. Britta Mccreedy   12/31/2015 Pathology Results Invasive squamous cell carcinoma approximate 4 cm mass posterior wall of  rectum   01/13/2016 Treatment Plan Change Dr. Leighton Ruff consultation- no surgery.  Recommend chemoXRT    Chart is reviewed. Oncology history is developed. Staging is completed. All dictations are reviewed.   Newly diagnosed squamous cell rectal cancer has been evaluated by a radiation therapy standpoint. He saw Dr. Lisbeth Renshaw on 01/10/2016. I have his note available and this is reviewed:  One of Dr. Jaclyn Prime primary concern is that of attempting to spare the patient's bone marrow reserve is to maintain the ability to tolerate chemotherapy given that the dominant life threatening issue remains her metastatic breast cancer. However, the patient is symptomatic from her rectal cancer and this also needs to be addressed with local therapy. The patient is seeing Dr. Marcello Moores to discuss possible surgery. If this is felt to be warranted as initial treatment I believe this would be reasonable and I would not street feel strongly about preceding this with radiation treatment or chemoradiation treatment. However, if the patient will not undergo surgery after this discussion, and I would recommend palliative radiation treatment to the pelvis. This could be done in a focal manner to a greater extent than normal for rectal cancer and sparing of the bone marrow could be achieved to a much greater extent than a traditional rectal cancer plan. The patient's treatment could consist of traditional 5-1/2 week course of treatment or a shorter course of treatment such as a two-week course. One additional alternative would involve 5 Gy5 fraction as is often more commonly done in [place], which also may be a good short course option for the patient. With the diagnosis of squamous cell cancer, the patient may have a very radiosensitive tumor if she does require palliative radiation therapy.  All of this was discussed with the patient. We will follow up on her discussion with Dr. Marcello Moores on Monday and then proceed accordingly. The  patient could undergo simulation in a week on Thursday if this appears appropriate. All the patient's questions were answered today and she states that she is comfortable with this plan.  Ms. Goodell returns to the [place] today unaccompanied. She continues to wear her lymphedema sleeve on her left arm.  She notes that she's having bleeding with her BM. She notes that, every now and then, she'll feel like she needs to go to the bathroom, but all it is is blood. She will then wipe with 3 tissues to make sure she's clean.  Her pain ranges from a 4 to a 6. She says it's enough right now that she can't do something else and forget about it. Ibuprofen was working well, but when she started on the Xeloda, she stopped taking her ibuprofen. She was doing 600 mg. She notes that she doesn't want to try opioids again because it just makes her want to sleep. She did hydrocodone before she went to bed; it took an hour to come into effect, and she fell asleep. Then she became constipated, which she doesn't want to do because it causes more pain and more problems. She took tramadol, which helped her but she also became constipated.   Her main concern is constipation on the opioids, and she knows not to take ibuprofen if she's bleeding.   She notes that she's got plenty of hydrocodone, has miralax. She does not have tramadol. She is open to trying the fentanyl patch for pain control.  She's concerned about her CT scan showing advanced atherosclerosis, and was advised that she needs to wait until she's done with all of her treatment to work with the heart doctor. She is already scheduled for a stress test once she's done with radiation, and knows to talk to the doctor about how she's feeling before she does it.  She notes no mouth sores, that her hands and feet don't feel sore, and notes no bad heartburn. Overall, she feels fine. In terms of her bowels, she notes nothing extremely different, at least as far as  starting her Xeloda treatment. During the physical exam, when her hands are examined, she notes "I swear I can't tell I'm taking it."  Review of Systems  Constitutional: Negative.  Negative for fever, chills and weight loss.  HENT: Negative.  Negative for hearing loss and tinnitus.   Eyes: Negative.  Negative for blurred vision and double vision.  Respiratory: Negative.  Negative for cough, hemoptysis, sputum production, shortness of breath and wheezing.   Cardiovascular: Negative.  Negative for chest pain.  Gastrointestinal: Positive for blood in stool and diarrhea (Loose stools, multiple times per day.). Negative for abdominal pain, constipation, melena, nausea and vomiting.       Diarrhea is unchanged  Genitourinary: Negative.  Negative for dysuria, urgency, frequency and hematuria.  Musculoskeletal: Negative.  Negative for myalgias, back pain, joint pain and falls.  Skin: Negative.   Neurological: Negative.  Negative for dizziness, sensory change, speech change, focal weakness, seizures, loss of consciousness, weakness and headaches.  Endo/Heme/Allergies: Negative.   Psychiatric/Behavioral: Negative.   14 point review of systems was performed and is negative except as detailed under history of present illness and above  PAST MEDICAL HISTORY:   Past Medical History  Diagnosis Date  . Osteoporosis 01/27/2011  . Borderline hypertension   . Pneumonia   . Bladder infection   . Fatigue   . Generalized headaches   . Wears glasses   . Swollen lymph nodes   . Retinal detachment   . Reflux   . URI (upper respiratory infection) 02/17/12  . Adenocarcinoma of breast ([place]) 01/27/2011    stage IIIc breast cancer  . Breast cancer ([place])   . Post-mastectomy lymphedema syndrome   . Closed fracture of humerus sept 2013  . Port catheter in place 04/14/2013  . Squamous cell carcinoma of rectum ([place]) 01/17/2016  . Bone metastasis ([place]) 01/17/2016  . 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  Recurrent UTI 01/17/2016    ALLERGIES: Allergies  Allergen Reactions  . Ciprofloxacin Other (See Comments)    tendonitis      MEDICATIONS: I have reviewed the patient's current medications.    Current Outpatient Prescriptions on File Prior to Visit  Medication Sig Dispense Refill  . calcium-vitamin D (OSCAL WITH D) 500-200 MG-UNIT per tablet Take 2 tablets by mouth daily.     . capecitabine (XELODA) 150 MG tablet Take 1450 mg PO BID Sunday PM- Friday AM during XRT 180 tablet 0  . capecitabine (XELODA) 500 MG tablet Take 1450 mg PO BID Sunday PM- Friday AM during XRT 120 tablet 0  . diphenhydrAMINE (SOMINEX) 25 MG tablet Take 25 mg by mouth at bedtime as needed. To help with sleeping    . escitalopram (LEXAPRO) 10 MG tablet Take 1 tablet (10 mg total) by mouth daily. (Patient taking differently: Take 15 mg by mouth daily. ) 30 tablet 5  . HYDROcodone-acetaminophen (NORCO) 10-325 MG tablet Take 1 tablet by  mouth every 6 (six) hours as needed. 10 tablet 0  . ibuprofen (ADVIL,MOTRIN) 400 MG tablet Take 400 mg by mouth every 4 (four) hours as needed for mild pain.    . magnesium oxide (MAG-OX) 400 MG tablet Take 400 mg by mouth 2 (two) times daily.    . metoprolol tartrate (LOPRESSOR) 25 MG tablet Take 25 mg by mouth daily.    . nitrofurantoin (MACRODANTIN) 100 MG capsule Take 100 mg by mouth 2 (two) times daily.    Marland Kitchen omeprazole (PRILOSEC) 20 MG capsule Take 20 mg by mouth daily.    . ondansetron (ZOFRAN) 8 MG tablet Take 8 mg by mouth every 8 (eight) hours as needed for nausea or vomiting.    . folic acid (FOLVITE) 852 MCG tablet Take 400 mcg by mouth daily. Reported on 02/14/2016     No current facility-administered medications on file prior to visit.     PAST SURGICAL HISTORY Past Surgical History  Procedure Laterality Date  . Cataract extraction    . Cervical disc fusioni    . Retinal detachment and repair    . Btl    . Mastectomy modified radical    . Port-a-cath removal      in  place (not removed)  . Biopsy of lymph node      super clavicle  . Breast surgery    . Lymph node biopsy Right 06/19/2015    Procedure: RIGHT NECK LYMPH NODE EXCISION;  Surgeon: Rolm Bookbinder, MD;  Location: [facility];  Service: General;  Laterality: Right;    FAMILY HISTORY: Family History  Problem Relation Age of Onset  . Hypertension Mother   . Heart disease Mother     atrial fib  . Heart disease Father   . Other Father     glaucoma    SOCIAL HISTORY:  reports that she has never smoked. She has never used smokeless tobacco. She reports that she drinks about 4.2 oz of alcohol per week. She reports that she does not use illicit drugs.  Social History   Social History  . Marital Status: Divorced    Spouse Name: N/A  . Number of Children: N/A  . Years of Education: N/A   Social History Main Topics  . Smoking status: Never Smoker   . Smokeless tobacco: Never Used  . Alcohol Use: 4.2 oz/week    7 Glasses of wine per week  . Drug Use: No  . Sexual Activity: Not Asked   Other Topics Concern  . None   Social History Narrative    PERFORMANCE STATUS: The patient's performance status is 1 - Symptomatic but completely ambulatory  PHYSICAL EXAM: Most Recent Vital Signs: Blood pressure 156/52, pulse 80, temperature 98.6 F (37 C), temperature source Oral, resp. rate 18, weight 142 lb 9.6 oz (64.683 kg), SpO2 98 %. General appearance: alert, cooperative, appears stated age, no distress and smiling and unaccompanied. Lymphedema sleeve on left upper extremity. Wears glasses. Head: Normocephalic, without obvious abnormality, atraumatic Eyes: negative findings: lids and lashes normal, conjunctivae and sclerae normal and corneas clear Throat: normal findings: oropharynx pink & moist without lesions or evidence of thrush Neck: no adenopathy, supple, symmetrical, trachea midline and thyroid not enlarged, symmetric, no tenderness/mass/nodules Lungs: clear to  auscultation bilaterally Heart: regular rate and rhythm, S1, S2 normal, no murmur, click, rub or gallop Abdomen: soft, non-tender; bowel sounds normal; no masses,  no organomegaly Extremities: extremities normal, atraumatic, no cyanosis or edema and left UE lympedema sleeve  in place Skin: Skin color, texture, turgor normal. No rashes or lesions Lymph nodes: Cervical, supraclavicular, and axillary nodes normal. Neurologic: Grossly normal  LABORATORY DATA:  Results for orders placed or performed in visit on 02/14/16 (from the past 48 hour(s))  CBC with Differential     Status: Abnormal   Collection Time: 02/14/16  8:56 AM  Result Value Ref Range   WBC 5.2 4.0 - 10.5 K/uL   RBC 2.72 (L) 3.87 - 5.11 MIL/uL   Hemoglobin 10.5 (L) 12.0 - 15.0 g/dL   HCT 31.2 (L) 36.0 - 46.0 %   MCV 114.7 (H) 78.0 - 100.0 fL   MCH 38.6 (H) 26.0 - 34.0 pg   MCHC 33.7 30.0 - 36.0 g/dL   RDW 12.4 11.5 - 15.5 %   Platelets 230 150 - 400 K/uL   Neutrophils Relative % 72 %   Neutro Abs 3.7 1.7 - 7.7 K/uL   Lymphocytes Relative 20 %   Lymphs Abs 1.1 0.7 - 4.0 K/uL   Monocytes Relative 7 %   Monocytes Absolute 0.4 0.1 - 1.0 K/uL   Eosinophils Relative 0 %   Eosinophils Absolute 0.0 0.0 - 0.7 K/uL   Basophils Relative 0 %   Basophils Absolute 0.0 0.0 - 0.1 K/uL  Comprehensive metabolic panel     Status: Abnormal   Collection Time: 02/14/16  8:56 AM  Result Value Ref Range   Sodium 135 135 - 145 mmol/L   Potassium 3.9 3.5 - 5.1 mmol/L   Chloride 104 101 - 111 mmol/L   CO2 26 22 - 32 mmol/L   Glucose, Bld 132 (H) 65 - 99 mg/dL   BUN 17 6 - 20 mg/dL   Creatinine, Ser 0.99 0.44 - 1.00 mg/dL   Calcium 9.1 8.9 - 10.3 mg/dL   Total Protein 6.5 6.5 - 8.1 g/dL   Albumin 3.9 3.5 - 5.0 g/dL   AST 27 15 - 41 U/L   ALT 23 14 - 54 U/L   Alkaline Phosphatase 55 38 - 126 U/L   Total Bilirubin 0.6 0.3 - 1.2 mg/dL   GFR calc non Af Amer 59 (L) >60 mL/min   GFR calc Af Amer >60 >60 mL/min    Comment: (NOTE) The eGFR  has been calculated using the CKD EPI equation. This calculation has not been validated in all clinical situations. eGFR's persistently <60 mL/min signify possible Chronic Kidney Disease.    Anion gap 5 5 - 15             Results for KAELYNNE, CHRISTLEY (MRN [id number]) as of 02/14/2016 13:45  Ref. Range 02/14/2016 08:56  Sodium Latest Ref Range: 135-145 mmol/L 135  Potassium Latest Ref Range: 3.5-5.1 mmol/L 3.9  Chloride Latest Ref Range: 101-111 mmol/L 104  CO2 Latest Ref Range: 22-32 mmol/L 26  BUN Latest Ref Range: 6-20 mg/dL 17  Creatinine Latest Ref Range: 0.44-1.00 mg/dL 0.99  Calcium Latest Ref Range: 8.9-10.3 mg/dL 9.1  EGFR (Non-African Amer.) Latest Ref Range: >60 mL/min 59 (L)  EGFR (African American) Latest Ref Range: >60 mL/min >60  Glucose Latest Ref Range: 65-99 mg/dL 132 (H)  Anion gap Latest Ref Range: 5-15  5  Alkaline Phosphatase Latest Ref Range: 38-126 U/L 55  Albumin Latest Ref Range: 3.5-5.0 g/dL 3.9  AST Latest Ref Range: 15-41 U/L 27  ALT Latest Ref Range: 14-54 U/L 23  Total Protein Latest Ref Range: 6.5-8.1 g/dL 6.5  Total Bilirubin Latest Ref Range: 0.3-1.2 mg/dL 0.6  WBC Latest  Ref Range: 4.0-10.5 K/uL 5.2  RBC Latest Ref Range: 3.87-5.11 MIL/uL 2.72 (L)  Hemoglobin Latest Ref Range: 12.0-15.0 g/dL 10.5 (L)  HCT Latest Ref Range: 36.0-46.0 % 31.2 (L)  MCV Latest Ref Range: 78.0-100.0 fL 114.7 (H)  MCH Latest Ref Range: 26.0-34.0 pg 38.6 (H)  MCHC Latest Ref Range: 30.0-36.0 g/dL 33.7  RDW Latest Ref Range: 11.5-15.5 % 12.4  Platelets Latest Ref Range: 150-400 K/uL 230  Neutrophils Latest Units: % 72  Lymphocytes Latest Units: % 20  Monocytes Relative Latest Units: % 7  Eosinophil Latest Units: % 0  Basophil Latest Units: % 0  NEUT# Latest Ref Range: 1.7-7.7 K/uL 3.7  Lymphocyte # Latest Ref Range: 0.7-4.0 K/uL 1.1  Monocyte # Latest Ref Range: 0.1-1.0 K/uL 0.4  Eosinophils Absolute Latest Ref Range: 0.0-0.7 K/uL 0.0  Basophils Absolute Latest  Ref Range: 0.0-0.1 K/uL 0.0    RADIOGRAPHY:   CLINICAL DATA: New diagnosis of anal cancer. Rectal pain. Left breast cancer 8 years ago with mastectomy. Status post chemotherapy and radiation therapy. History of bone metastasis.  EXAM: CT CHEST, ABDOMEN, AND PELVIS WITH CONTRAST  TECHNIQUE: Multidetector CT imaging of the chest, abdomen and pelvis was performed following the standard protocol during bolus administration of intravenous contrast.  CONTRAST: 159m ISOVUE-300 IOPAMIDOL (ISOVUE-300) INJECTION 61%  COMPARISON: PET 11/15/2015. Pelvic CT of 01/16/2016.  FINDINGS: CT CHEST FINDINGS  Mediastinum/Lymph Nodes: No supraclavicular adenopathy. A right Port-A-Cath which terminates at the low SVC. No axillary adenopathy. Left mastectomy and axillary node dissection. Mild cardiomegaly. No pericardial effusion. Lad coronary artery atherosclerosis. No central pulmonary embolism, on this non-dedicated study. No mediastinal or hilar adenopathy.  Dilated lower esophagus with fluid level within on image 45/series 2.  Lungs/Pleura: No pleural fluid. 5 mm right upper lobe nodular density on image 40/series 7 is similar to on the prior PET.  Anterior left upper lobe radiation fibrosis and pleural thickening.  Musculoskeletal: Remote lower bilateral rib trauma.  CT ABDOMEN PELVIS FINDINGS  Hepatobiliary: Normal liver. Normal gallbladder, without biliary ductal dilatation.  Pancreas: Normal, without mass or ductal dilatation.  Spleen: Normal in size, without focal abnormality.  Adrenals/Urinary Tract: Normal adrenal glands. Normal kidneys, without hydronephrosis. Normal urinary bladder.  Stomach/Bowel: Normal stomach, without wall thickening. No dominant anal rectal mass identified. Minimal motion degradation in the upper pelvis. Otherwise normal colon, terminal ileum, and appendix. Normal small bowel.  Vascular/Lymphatic: Aortic and branch vessel  atherosclerosis. No retroperitoneal or retrocrural adenopathy. No pelvic sidewall adenopathy. No perianal or perirectal adenopathy.  Reproductive: Normal uterus and adnexa. No adnexal mass.  Other: No significant free fluid. No evidence of omental or peritoneal disease.  Musculoskeletal: Right sided L1 vertebral body lesion measures 1.7 cm and is not significantly changed. Anterior L3 sclerotic lesion is similar, 2.8 cm.  IMPRESSION: 1. No anorectal primary identified. No evidence of soft tissue metastasis. 2. Left mastectomy and axillary node dissection. Similar sclerotic osseous lesions. 3. Esophageal air fluid level suggests dysmotility or gastroesophageal reflux. 4. Age advanced coronary artery atherosclerosis. Recommend assessment of coronary risk factors and consideration of medical therapy.   Electronically Signed  By: KAbigail MiyamotoM.D.  On: 01/22/2016 13:34     PATHOLOGY:  N/A   ASSESSMENT/PLAN:  Excellent PS Squamous cell carcinoma of anus ([place]  Formal opinion from GI tumor board was obtained and recommendations for XELODA with concurrent XRT were noted. She is tolerating XELODA.  I reviewed side effects of concern including worsening baseline diarrhea, nausea, vomiting, abdominal pain, mouth sores, sore red  or tender hands or feet. She knows to stop the drug and call with any of these symptoms.   We discussed her rectal bleeding and if it worsens she knows to call or to present to the ED. She notes that it only occurs with her BM's.   Weekly labs will be scheduled with weekly follow-up.    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 BRCA2 POSITIVE.  History of stage IIIc left breast cancer in 2009 treated with neoadjuvant chemotherapy consisting of epirubicin and Cytoxan followed by 4 cycles of docetaxel from 04/16/2008-08/13/2008 with the initiation of Herceptin for 52 weeks at the start  of docetaxel. She then underwent a left mastectomy on 08/30/2008 showing persistent disease in the breast and lymph nodes. This was followed by radiation therapy from 09/17/2008-11/26/2008 to the left chest wall, supraclavicular, and axillary node areas. She then moved on to antiestrogen therapy with letrozole (11/27/2008-04/20/2011). She was found to have recurrent breast cancer on supraclavicular lymph node biopsy on 03/30/2011 leading to a change in antiestrogen therapy to tamoxifen (04/21/2011-06/04/2014). Pet imaging on 05/25/2014 demonstrated progression of left submandibular lymph node, mediastinal lymph node, and probable lung involvement. This was followed by a biopsy on 06/13/2014 of a left supraclavicular mass that did reveal metastatic carcinoma consistent with breast primary. She was then treated with carboplatin and Taxol for 6 cycles with an excellent response (06/25/2014-10/09/2014). She was then transition back to hormone therapy consisting of Ibrance and Faslodex (11/13/2014-06/10/2015). This was complicated by grade 3 neutropenia with cycle #2 resulting in a dose reduction of Ibrance to 100 mg for cycle 3. Unfortunately, on 06/10/2015, she was noted to have progression of disease in the right mid cervical lymph node over a 4 day period that was PET avid. Therapy was therefore changed to exemestane and everolimus (93/26/7124-58/04/9832) that was complicated by an adverse reaction consisting of diffuse pneumonitis. This regimen was therefore discontinued. She then transitioned back to systemic chemotherapy consisting of cisplatin every 14 days (08/23/2015-12/23/2015). She was put on a drug holiday beginning on 12/23/2015 in preparation for a trip to [place] on 01/25/2016.  She will continue XELODA after completion of XRT for her STAGE IV breast cancer.    Bone metastasis ([facility]) Bone metastases from breast cancer. On Xgeva therapy. She is also taking calcium and vitamin D.  Supportive therapy  plan is built.  Next Xgeva injection is due on 02/12/2016.  TREATMENT related PAIN/Opioid induced constipation  We may try an opioid rotation, low-dose fentanyl patch, with the addition of miralax to keep her stools soft and control her constipation.  I will write her for a 43mg  fentanyl patch, and also write her for tramadol for breakthrough. She will slowly titrate up her fentanyl patch dose. I will write out instructions on how to take her fentanyl, and she knows to call in if she feels she cannot tolerate it.  She also needs lipitor.  She will remain on weekly labs to monitor her hemoglobin.  The patient also called yesterday and told uKoreashe was due for a port flush today. She is okay with waiting to do her port flush next week, and have labs drawn at that time.  Orders Placed This Encounter  Procedures  . CBC with Differential    Collect via port    Standing Status:   Future    Number of Occurrences:   1    Standing Expiration Date:   02/13/2017  . Ferritin  Collect via port    Standing Status:   Future    Number of Occurrences:   1    Standing Expiration Date:   02/13/2017  . Vitamin B12    Collect via port    Standing Status:   Future    Number of Occurrences:   1    Standing Expiration Date:   02/13/2017  . Folate    Collect via port    Standing Status:   Future    Number of Occurrences:   1    Standing Expiration Date:   02/13/2017  . Comprehensive metabolic panel    Collect via port    Standing Status:   Future    Number of Occurrences:   1    Standing Expiration Date:   02/13/2017    All questions were answered. The patient knows to call the clinic with any problems, questions or concerns. We can certainly see the patient much sooner if necessary.  This document serves as a record of services personally performed by Ancil Linsey, MD. It was created on her behalf by Toni Amend, a trained medical scribe. The creation of this record is based on the scribe's  personal observations and the provider's statements to them. This document has been checked and approved by the attending provider.  I have reviewed the above documentation for accuracy and completeness, and I agree with the above.  This note is electronically signed by: Molli Hazard, MD   02/14/2016 10:53 AM

## 2016-02-15 LAB — CANCER ANTIGEN 27.29: CA 27.29: 25.5 U/mL (ref 0.0–38.6)

## 2016-02-15 LAB — CEA: CEA: 1.4 ng/mL (ref 0.0–4.7)

## 2016-02-17 ENCOUNTER — Telehealth (HOSPITAL_COMMUNITY): Payer: Self-pay | Admitting: *Deleted

## 2016-02-17 NOTE — Telephone Encounter (Signed)
Returned patient phone call , Per dr Whitney Muse , she can hold off on her xeloda, continue your Protonix and call us back on Wednesday to let us know how she feels. We may have to decrease your xeloda dose.

## 2016-02-18 ENCOUNTER — Other Ambulatory Visit (HOSPITAL_COMMUNITY): Payer: Self-pay | Admitting: Oncology

## 2016-02-18 DIAGNOSIS — D539 Nutritional anemia, unspecified: Secondary | ICD-10-CM

## 2016-02-19 ENCOUNTER — Telehealth (HOSPITAL_COMMUNITY): Payer: Self-pay | Admitting: *Deleted

## 2016-02-19 ENCOUNTER — Other Ambulatory Visit (HOSPITAL_COMMUNITY): Payer: Self-pay | Admitting: Emergency Medicine

## 2016-02-19 NOTE — Progress Notes (Signed)
Pt states that her heart burn is a little better but still bothering her.  Talked with Dr Whitney Muse, prilosec 40mg  BID and if that doesn't help you can add Maalox.  Call us on Friday pt verbalized understanding

## 2016-02-20 ENCOUNTER — Encounter (HOSPITAL_COMMUNITY): Payer: Self-pay | Admitting: Oncology

## 2016-02-20 ENCOUNTER — Encounter (HOSPITAL_BASED_OUTPATIENT_CLINIC_OR_DEPARTMENT_OTHER): Payer: BLUE CROSS/BLUE SHIELD | Admitting: Oncology

## 2016-02-20 ENCOUNTER — Encounter (HOSPITAL_BASED_OUTPATIENT_CLINIC_OR_DEPARTMENT_OTHER): Payer: BLUE CROSS/BLUE SHIELD

## 2016-02-20 VITALS — BP 139/61 | HR 70 | Temp 98.4°F | Resp 16 | Wt 144.1 lb

## 2016-02-20 DIAGNOSIS — C7951 Secondary malignant neoplasm of bone: Secondary | ICD-10-CM

## 2016-02-20 DIAGNOSIS — C210 Malignant neoplasm of anus, unspecified: Secondary | ICD-10-CM | POA: Diagnosis not present

## 2016-02-20 DIAGNOSIS — Z452 Encounter for adjustment and management of vascular access device: Secondary | ICD-10-CM | POA: Diagnosis not present

## 2016-02-20 DIAGNOSIS — Z95828 Presence of other vascular implants and grafts: Secondary | ICD-10-CM

## 2016-02-20 DIAGNOSIS — C50912 Malignant neoplasm of unspecified site of left female breast: Secondary | ICD-10-CM

## 2016-02-20 LAB — COMPREHENSIVE METABOLIC PANEL
ALT: 15 U/L (ref 14–54)
AST: 21 U/L (ref 15–41)
Albumin: 3.9 g/dL (ref 3.5–5.0)
Alkaline Phosphatase: 54 U/L (ref 38–126)
Anion gap: 6 (ref 5–15)
BUN: 18 mg/dL (ref 6–20)
CO2: 25 mmol/L (ref 22–32)
Calcium: 9 mg/dL (ref 8.9–10.3)
Chloride: 106 mmol/L (ref 101–111)
Creatinine, Ser: 0.93 mg/dL (ref 0.44–1.00)
GFR calc Af Amer: >60 mL/min (ref >60)
GFR calc non Af Amer: >60 mL/min (ref >60)
Glucose, Bld: 119 mg/dL — ABNORMAL HIGH (ref 65–99)
Potassium: 4 mmol/L (ref 3.5–5.1)
Sodium: 137 mmol/L (ref 135–145)
Total Bilirubin: 0.4 mg/dL (ref 0.3–1.2)
Total Protein: 6.6 g/dL (ref 6.5–8.1)

## 2016-02-20 LAB — CBC WITH DIFFERENTIAL/PLATELET
Basophils Absolute: 0 10*3/uL (ref 0.0–0.1)
Basophils Relative: 0 %
Eosinophils Absolute: 0 10*3/uL (ref 0.0–0.7)
Eosinophils Relative: 1 %
HCT: 30.8 % — ABNORMAL LOW (ref 36.0–46.0)
Hemoglobin: 10.5 g/dL — ABNORMAL LOW (ref 12.0–15.0)
Lymphocytes Relative: 19 %
Lymphs Abs: 0.9 10*3/uL (ref 0.7–4.0)
MCH: 38.7 pg — ABNORMAL HIGH (ref 26.0–34.0)
MCHC: 34.1 g/dL (ref 30.0–36.0)
MCV: 113.7 fL — ABNORMAL HIGH (ref 78.0–100.0)
Monocytes Absolute: 0.4 10*3/uL (ref 0.1–1.0)
Monocytes Relative: 8 %
Neutro Abs: 3.6 10*3/uL (ref 1.7–7.7)
Neutrophils Relative %: 72 %
Platelets: 221 10*3/uL (ref 150–400)
RBC: 2.71 MIL/uL — ABNORMAL LOW (ref 3.87–5.11)
RDW: 12.6 % (ref 11.5–15.5)
WBC: 4.9 10*3/uL (ref 4.0–10.5)

## 2016-02-20 LAB — FOLATE: Folate: 31.7 ng/mL (ref >5.9)

## 2016-02-20 LAB — VITAMIN B12: Vitamin B-12: 291 pg/mL (ref 180–914)

## 2016-02-20 LAB — FERRITIN: Ferritin: 94 ng/mL (ref 11–307)

## 2016-02-20 MED ORDER — DENOSUMAB 120 MG/1.7ML ~~LOC~~ SOLN
120.0000 mg | Freq: Once | SUBCUTANEOUS | Status: AC
  Administered 2016-02-20: 120 mg via SUBCUTANEOUS
  Filled 2016-02-20: qty 1.7

## 2016-02-20 MED ORDER — SODIUM CHLORIDE 0.9% FLUSH
10.0000 mL | INTRAVENOUS | Status: DC | PRN
  Administered 2016-02-20: 10 mL via INTRAVENOUS
  Filled 2016-02-20: qty 10

## 2016-02-20 MED ORDER — HEPARIN SOD (PORK) LOCK FLUSH 100 UNIT/ML IV SOLN
500.0000 [IU] | Freq: Once | INTRAVENOUS | Status: AC
  Administered 2016-02-20: 500 [IU] via INTRAVENOUS

## 2016-02-20 NOTE — Progress Notes (Signed)
[name] presented for Portacath access and flush. Portacath located right chest wall accessed with  H 20 needle. Good blood return present. Portacath flushed with 46ml NS and 500U/47ml Heparin and needle removed intact. Procedure without incident. Patient tolerated procedure well.  Labs drawn per orders today  [name] presents today for injection per MD orders. Xgeva mg administered SQ in right Abdomen. Administration without incident. Patient tolerated well.

## 2016-02-20 NOTE — Progress Notes (Signed)
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  INTERVAL HISTORY: [name] 65 y.o. female returns for followup of Stage IV breast cancer ER/PR positive, HER-2/neu negative. BRCA2 POSITIVE. History of stage IIIc left breast cancer in 2009 treated with neoadjuvant chemotherapy consisting of epirubicin and Cytoxan followed by 4 cycles of docetaxel from 04/16/2008-08/13/2008 with the initiation of Herceptin for 52 weeks at the start of docetaxel. She then underwent a left mastectomy on 08/30/2008 showing persistent disease in the breast and lymph nodes. This was followed by radiation therapy from 09/17/2008-11/26/2008 to the left chest wall, supraclavicular, and axillary node areas. She then moved on to antiestrogen therapy with letrozole (11/27/2008-04/20/2011). She was found to have recurrent breast cancer on supraclavicular lymph node biopsy on 03/30/2011 leading to a change in antiestrogen therapy to tamoxifen (04/21/2011-06/04/2014). Pet imaging on 05/25/2014 demonstrated progression of left submandibular lymph node, mediastinal lymph node, and probable lung involvement. This was followed by a biopsy on 06/13/2014 of a left supraclavicular mass that did reveal metastatic carcinoma consistent with breast primary. She was then treated with carboplatin and Taxol for 6 cycles with an excellent response (06/25/2014-10/09/2014). She was then transition back to hormone therapy consisting of Ibrance and Faslodex (11/13/2014-06/10/2015). This was complicated by grade 3 neutropenia with cycle #2 resulting in a dose reduction of Ibrance to 100 mg for cycle 3. Unfortunately, on 06/10/2015, she was noted to have progression of disease in the right mid cervical lymph node over a 4 day period that was PET avid. Therapy was  therefore changed to exemestane and everolimus (50/93/2671-24/58/0998) that was complicated by an adverse reaction consisting of diffuse pneumonitis. This regimen was therefore discontinued. She then transitioned back to systemic chemotherapy consisting of cisplatin every 14 days (08/23/2015-12/23/2015). She was put on a drug holiday beginning on 12/23/2015 in preparation for a trip to [place] on 01/25/2016. AND newly diagnosed squamous cell carcinoma of anus, HPV POSITIVE, undergoing XRT with Xeloda concomitantly in the neoadjuvant setting.    Adenocarcinoma of left breast   04/09/2008 Pathology Results Positive for breast cancer, ER 100%, PR 5%, HER-2/neu POSITIVE.   04/09/2008 Procedure Biopsy of left axillary node mass    04/16/2008 - 08/13/2008 Chemotherapy Dose dense epirubicin/Cytoxan 4 cycles followed by docetaxel 4 cycles. Herceptin initiated with docetaxel for a total of 52 weeks.   08/30/2008 Definitive Surgery Left mastectomy revealing persistence of disease in the breast and lymph nodes.   09/17/2008 - 11/26/2008 Radiation Therapy Radiation to left chest wall, supraclavicular, and axillary nodal areas.   11/27/2008 - 04/20/2011 Anti-estrogen oral therapy Letrozole   03/30/2011 Procedure Left supraclavicular lymph node biopsy   03/30/2011 Pathology Results Recurrent breast cancer, invasive ductal type, ER 100%, PR 40%, HER-2 NEGATIVE   04/21/2011 - 06/04/2014 Anti-estrogen oral therapy Tamoxifen   05/25/2014 PET scan Progression in left submandibular lymph node, mediastinal lymph nodes, and probable lung involvement.   05/25/2014 Progression PET scan demonstrates progression of disease.   06/13/2014 Procedure Left supraclavicular mass biopsy   06/13/2014 Pathology Results Metastatic carcinoma consistent with breast primary, involving skeletal muscle, perineural invasion, LVI, no lymphoid tissue identified, ER 98%, PR negative, HER-2 negative   06/25/2014 - 10/09/2014 Chemotherapy Carboplatin/Taxol 6  cycles with excellent response   11/06/2014 - 06/10/2015 Chemotherapy Palbociclib and Faslodex   01/08/2015  Adverse Reaction Grade 3 neutropenia with cycle #2 of Palbociclib.   01/08/2015 Treatment Plan Change Palbociclib dose reduced to 100 mg for cycle #3.   06/10/2015 Progression Right cervical lymph node progression clinically developed over 4 days.  This lymph node was positive on PET scan in September 2016 and at that time was approximately 8 mm with possible progression in a single bone metastasis.   06/17/2015 Procedure Biopsy of right cervical lymph node   06/17/2015 Pathology Results Genetic sequencing performed revealing BRCA2 positivity   07/02/2015 - 08/01/2015 Chemotherapy Exemestane/everolimus, 25 mg/ 5 mg x 7 days then increasing to 10 mg daily.   08/01/2015 PET scan Diffuse pneumonitis.     08/01/2015 Adverse Reaction Pneumonitis secondary to drug reaction from everolimus.   08/23/2015 - 12/23/2015 Chemotherapy Cisplatin 40 mg/m every other week   11/15/2015 PET scan Interval resolution of hypermetabolic airspace opacities in the lungs which were likely inflammatory. Abnormal high activity at the anal rectal junction. Very low grade residual activity at the site of L3 metastatic lesion.   12/23/2015 Treatment Plan Change Holiday from treatment in preparation for trip to [place].    Squamous cell carcinoma of anus ([place])   12/31/2015 Procedure Colonoscopy by Dr. Britta Mccreedy   12/31/2015 Pathology Results Invasive squamous cell carcinoma approximate 4 cm mass posterior wall of rectum   01/13/2016 Treatment Plan Change Dr. Leighton Ruff consultation- no surgery.  Recommend chemoXRT   01/16/2016 Pathology Results HPV POSITIVE   02/07/2016 -  Chemotherapy Xeloda 1450 mg PO BID Sunday PM- Friday AM during XRT   02/07/2016 -  Radiation Therapy    She is tolerating Xeloda and XRT well.  She denies any significant diarrhea, signs/symptoms of palmar-plantar erythrodysesthesia, and stomatitis.  She notes some  anorectal discomfort without any open areas.  She is using Fentanyl 12.5 mcg/hr which has been minimally effective.  She will increase to 25 mcg/hr today/tomorrow.  She does have breakthrough pain medications as well.  She is using Miralax to maintain soft stools given her anal malignancy.    She is working on finding a balance with this medication.  Review of Systems  Constitutional: Negative for fever, chills and weight loss.  HENT: Negative.   Eyes: Negative.   Respiratory: Negative.   Cardiovascular: Negative.   Gastrointestinal: Negative.   Genitourinary: Negative.   Musculoskeletal: Negative.   Skin: Negative.   Neurological: Negative.  Negative for weakness.  Endo/Heme/Allergies: Negative.   Psychiatric/Behavioral: Negative.     Past Medical History  Diagnosis Date  . Osteoporosis 01/27/2011  . Borderline hypertension   . Pneumonia   . Bladder infection   . Fatigue   . Generalized headaches   . Wears glasses   . Swollen lymph nodes   . Retinal detachment   . Reflux   . URI (upper respiratory infection) 02/17/12  . Adenocarcinoma of breast ([place]) 01/27/2011    stage IIIc breast cancer  . Breast cancer ([place])   . Post-mastectomy lymphedema syndrome   . Closed fracture of humerus sept 2013  . Port catheter in place 04/14/2013  . 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 Bone metastasis ([place]) 01/17/2016  . 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 Recurrent UTI 01/17/2016    Past Surgical History  Procedure Laterality Date  . Cataract extraction    . Cervical disc fusioni    . Retinal detachment and repair    . Btl    . Mastectomy modified radical    .  Port-a-cath removal      in place (not removed)  . Biopsy of lymph node      super clavicle  . Breast surgery    . Lymph node biopsy Right 06/19/2015    Procedure: RIGHT NECK LYMPH NODE EXCISION;  Surgeon: Rolm Bookbinder, MD;  Location: [facility];  Service: General;  Laterality:  Right;    Family History  Problem Relation Age of Onset  . Hypertension Mother   . Heart disease Mother     atrial fib  . Heart disease Father   . Other Father     glaucoma    Social History   Social History  . Marital Status: Divorced    Spouse Name: N/A  . Number of Children: N/A  . Years of Education: N/A   Social History Main Topics  . Smoking status: Never Smoker   . Smokeless tobacco: Never Used  . Alcohol Use: 4.2 oz/week    7 Glasses of wine per week  . Drug Use: No  . Sexual Activity: Not Asked   Other Topics Concern  . None   Social History Narrative     PHYSICAL EXAMINATION  ECOG PERFORMANCE STATUS: 0 - Asymptomatic  Filed Vitals:   02/20/16 0900  BP: 139/61  Pulse: 70  Temp: 98.4 F (36.9 C)  Resp: 16    GENERAL:alert, no distress, well nourished, well developed, comfortable, cooperative, smiling and unaccompanied SKIN: skin color, texture, turgor are normal, no rashes or significant lesions HEAD: Normocephalic, No masses, lesions, tenderness or abnormalities EYES: normal, EOMI, Conjunctiva are pink and non-injected EARS: External ears normal OROPHARYNX:lips, buccal mucosa, and tongue normal and mucous membranes are moist  NECK: supple, trachea midline LYMPH:  not examined BREAST:not examined LUNGS: clear to auscultation  HEART: regular rate & rhythm ABDOMEN:abdomen soft and normal bowel sounds BACK: Back symmetric, no curvature. EXTREMITIES:less then 2 second capillary refill, no skin discoloration, no cyanosis  NEURO: alert & oriented x 3 with fluent speech, no focal motor/sensory deficits, gait normal   LABORATORY DATA: CBC    Component Value Date/Time   WBC 4.9 02/20/2016 1015   RBC 2.71* 02/20/2016 1015   HGB 10.5* 02/20/2016 1015   HCT 30.8* 02/20/2016 1015   PLT 221 02/20/2016 1015   MCV 113.7* 02/20/2016 1015   MCH 38.7* 02/20/2016 1015   MCHC 34.1 02/20/2016 1015   RDW 12.6 02/20/2016 1015   LYMPHSABS 0.9 02/20/2016  1015   MONOABS 0.4 02/20/2016 1015   EOSABS 0.0 02/20/2016 1015   BASOSABS 0.0 02/20/2016 1015      Chemistry      Component Value Date/Time   NA 137 02/20/2016 1015   K 4.0 02/20/2016 1015   CL 106 02/20/2016 1015   CO2 25 02/20/2016 1015   BUN 18 02/20/2016 1015   CREATININE 0.93 02/20/2016 1015      Component Value Date/Time   CALCIUM 9.0 02/20/2016 1015   ALKPHOS 54 02/20/2016 1015   AST 21 02/20/2016 1015   ALT 15 02/20/2016 1015   BILITOT 0.4 02/20/2016 1015        PENDING LABS:   RADIOGRAPHIC STUDIES:  Ct Chest W Contrast  01/22/2016  CLINICAL DATA:  New diagnosis of anal cancer. Rectal pain. Left breast cancer 8 years ago with mastectomy. Status post chemotherapy and radiation therapy. History of bone metastasis. EXAM: CT CHEST, ABDOMEN, AND PELVIS WITH CONTRAST TECHNIQUE: Multidetector CT imaging of the chest, abdomen and pelvis was performed following the standard protocol during  bolus administration of intravenous contrast. CONTRAST:  137m ISOVUE-300 IOPAMIDOL (ISOVUE-300) INJECTION 61% COMPARISON:  PET 11/15/2015.  Pelvic CT of 01/16/2016. FINDINGS: CT CHEST FINDINGS Mediastinum/Lymph Nodes: No supraclavicular adenopathy. A right Port-A-Cath which terminates at the low SVC. No axillary adenopathy. Left mastectomy and axillary node dissection. Mild cardiomegaly. No pericardial effusion. Lad coronary artery atherosclerosis. No central pulmonary embolism, on this non-dedicated study. No mediastinal or hilar adenopathy. Dilated lower esophagus with fluid level within on image 45/series 2. Lungs/Pleura: No pleural fluid. 5 mm right upper lobe nodular density on image 40/series 7 is similar to on the prior PET. Anterior left upper lobe radiation fibrosis and pleural thickening. Musculoskeletal: Remote lower bilateral rib trauma. CT ABDOMEN PELVIS FINDINGS Hepatobiliary: Normal liver. Normal gallbladder, without biliary ductal dilatation. Pancreas: Normal, without mass or  ductal dilatation. Spleen: Normal in size, without focal abnormality. Adrenals/Urinary Tract: Normal adrenal glands. Normal kidneys, without hydronephrosis. Normal urinary bladder. Stomach/Bowel: Normal stomach, without wall thickening. No dominant anal rectal mass identified. Minimal motion degradation in the upper pelvis. Otherwise normal colon, terminal ileum, and appendix. Normal small bowel. Vascular/Lymphatic: Aortic and branch vessel atherosclerosis. No retroperitoneal or retrocrural adenopathy. No pelvic sidewall adenopathy. No perianal or perirectal adenopathy. Reproductive: Normal uterus and adnexa.  No adnexal mass. Other: No significant free fluid. No evidence of omental or peritoneal disease. Musculoskeletal: Right sided L1 vertebral body lesion measures 1.7 cm and is not significantly changed. Anterior L3 sclerotic lesion is similar, 2.8 cm. IMPRESSION: 1. No anorectal primary identified. No evidence of soft tissue metastasis. 2. Left mastectomy and axillary node dissection. Similar sclerotic osseous lesions. 3. Esophageal air fluid level suggests dysmotility or gastroesophageal reflux. 4. Age advanced coronary artery atherosclerosis. Recommend assessment of coronary risk factors and consideration of medical therapy. Electronically Signed   By: KAbigail MiyamotoM.D.   On: 01/22/2016 13:34   Ct Abdomen Pelvis W Contrast  01/22/2016  CLINICAL DATA:  New diagnosis of anal cancer. Rectal pain. Left breast cancer 8 years ago with mastectomy. Status post chemotherapy and radiation therapy. History of bone metastasis. EXAM: CT CHEST, ABDOMEN, AND PELVIS WITH CONTRAST TECHNIQUE: Multidetector CT imaging of the chest, abdomen and pelvis was performed following the standard protocol during bolus administration of intravenous contrast. CONTRAST:  1076mISOVUE-300 IOPAMIDOL (ISOVUE-300) INJECTION 61% COMPARISON:  PET 11/15/2015.  Pelvic CT of 01/16/2016. FINDINGS: CT CHEST FINDINGS Mediastinum/Lymph Nodes: No  supraclavicular adenopathy. A right Port-A-Cath which terminates at the low SVC. No axillary adenopathy. Left mastectomy and axillary node dissection. Mild cardiomegaly. No pericardial effusion. Lad coronary artery atherosclerosis. No central pulmonary embolism, on this non-dedicated study. No mediastinal or hilar adenopathy. Dilated lower esophagus with fluid level within on image 45/series 2. Lungs/Pleura: No pleural fluid. 5 mm right upper lobe nodular density on image 40/series 7 is similar to on the prior PET. Anterior left upper lobe radiation fibrosis and pleural thickening. Musculoskeletal: Remote lower bilateral rib trauma. CT ABDOMEN PELVIS FINDINGS Hepatobiliary: Normal liver. Normal gallbladder, without biliary ductal dilatation. Pancreas: Normal, without mass or ductal dilatation. Spleen: Normal in size, without focal abnormality. Adrenals/Urinary Tract: Normal adrenal glands. Normal kidneys, without hydronephrosis. Normal urinary bladder. Stomach/Bowel: Normal stomach, without wall thickening. No dominant anal rectal mass identified. Minimal motion degradation in the upper pelvis. Otherwise normal colon, terminal ileum, and appendix. Normal small bowel. Vascular/Lymphatic: Aortic and branch vessel atherosclerosis. No retroperitoneal or retrocrural adenopathy. No pelvic sidewall adenopathy. No perianal or perirectal adenopathy. Reproductive: Normal uterus and adnexa.  No adnexal mass. Other: No significant free  fluid. No evidence of omental or peritoneal disease. Musculoskeletal: Right sided L1 vertebral body lesion measures 1.7 cm and is not significantly changed. Anterior L3 sclerotic lesion is similar, 2.8 cm. IMPRESSION: 1. No anorectal primary identified. No evidence of soft tissue metastasis. 2. Left mastectomy and axillary node dissection. Similar sclerotic osseous lesions. 3. Esophageal air fluid level suggests dysmotility or gastroesophageal reflux. 4. Age advanced coronary artery  atherosclerosis. Recommend assessment of coronary risk factors and consideration of medical therapy. Electronically Signed   By: Abigail Miyamoto M.D.   On: 01/22/2016 13:34     PATHOLOGY:    ASSESSMENT AND PLAN:  Squamous cell carcinoma of anus ([facility]) Squamous cell carcinoma of anus, HPV POSITIVE, undergoing XRT with Xeloda concomitantly in the neoadjuvant setting.  Initial medical oncology recommendations provided by Dr. Tressie Stalker at [facility].  Oncology history updated.  I was able to get a second opinion on pathology from [facility] pathology regarding diagnosis.  This is now available for review in [facility].  Labs today: CBC diff, CMET.  I personally reviewed and went over laboratory results with the patient.  The results are noted within this dictation.  Labs satisfy continued treatment parameters.  Dr. Tressie Stalker does dictate that there is a history of anal intercourse in the remote past.  Weekly labs: CBC diff, CMET.  She was started on Fentanyl 12.5 mcg/hr for pain control on Tuesday and her pain is improved, but could be better.  As a result, she is increasing her Fentanyl patch to 25 mcg/hr (two 12.5 mcg/hr patches).  She also is using 1/2 of a Hydrocodone or a Tramadol for breakthrough pain management.   Due to increased GERD symptoms with Xeloda, it is currently on hold with an increase in her PPI.  She will call tomorrow as planned with an update and further medical oncology recommendations regarding her Xeloda, as recommended by Dr. Whitney Muse earlier this week.  She notes that since holding Xeloda, and an increase in PPI, her symptoms have resolved.  She will call tomorrow AM.  She approximately 2 more weeks of XRT.  Return in 1 weeks for follow-up.  She knows to call with any issues related to her chemotherapy or side effects.  Adenocarcinoma of left breast Stage IV breast cancer ER/PR positive, HER-2/neu negative. BRCA2 POSITIVE.  History of stage IIIc left breast cancer in 2009 treated  with neoadjuvant chemotherapy consisting of epirubicin and Cytoxan followed by 4 cycles of docetaxel from 04/16/2008-08/13/2008 with the initiation of Herceptin for 52 weeks at the start of docetaxel. She then underwent a left mastectomy on 08/30/2008 showing persistent disease in the breast and lymph nodes. This was followed by radiation therapy from 09/17/2008-11/26/2008 to the left chest wall, supraclavicular, and axillary node areas. She then moved on to antiestrogen therapy with letrozole (11/27/2008-04/20/2011). She was found to have recurrent breast cancer on supraclavicular lymph node biopsy on 03/30/2011 leading to a change in antiestrogen therapy to tamoxifen (04/21/2011-06/04/2014). Pet imaging on 05/25/2014 demonstrated progression of left submandibular lymph node, mediastinal lymph node, and probable lung involvement. This was followed by a biopsy on 06/13/2014 of a left supraclavicular mass that did reveal metastatic carcinoma consistent with breast primary. She was then treated with carboplatin and Taxol for 6 cycles with an excellent response (06/25/2014-10/09/2014). She was then transition back to hormone therapy consisting of Ibrance and Faslodex (11/13/2014-06/10/2015). This was complicated by grade 3 neutropenia with cycle #2 resulting in a dose reduction of Ibrance to 100 mg for cycle 3. Unfortunately, on 06/10/2015,  she was noted to have progression of disease in the right mid cervical lymph node over a 4 day period that was PET avid. Therapy was therefore changed to exemestane and everolimus (91/99/5790-04/29/414) that was complicated by an adverse reaction consisting of diffuse pneumonitis. This regimen was therefore discontinued. She then transitioned back to systemic chemotherapy consisting of cisplatin every 14 days (08/23/2015-12/23/2015). She was put on a drug holiday beginning on 12/23/2015 in preparation for a trip to [place] on 01/25/2016.  Her oncology history is up to  date.  Currently, she has developed an anal cancer that will require local treatment consisting of chemoradiation. Treatment with Xeloda has been discussed and this can be utilized post local treatment for maintenance for her breast cancer.  She has seen Dr. Mauro Kaufmann Muss in the past for second opinion.  I did briefly discuss CODE STATUS given her incurable disease. At this time, she wishes to be a FULL CODE.  She is not unrealistic with regards to the incurability of her breast cancer. Performance status is excellent. In the future, we will need to readdress this issue as she declines. Fortunately, from a breast cancer perspective, her options are becoming more limited.  Bone metastasis ([facility]) Bone metastases from breast cancer. On Xgeva therapy. She is also taking calcium and vitamin D.  Supportive therapy plan is built.  Next Xgeva injection is due today.      ORDERS PLACED FOR THIS ENCOUNTER: No orders of the defined types were placed in this encounter.    MEDICATIONS PRESCRIBED THIS ENCOUNTER: No orders of the defined types were placed in this encounter.    THERAPY PLAN:  Continue with treatment as outlined above.  All questions were answered. The patient knows to call the clinic with any problems, questions or concerns. We can certainly see the patient much sooner if necessary.  Patient and plan discussed with Dr. Ancil Linsey and she is in agreement with the aforementioned.   This note is electronically signed by: Doy Mince 02/20/2016 8:30 PM

## 2016-02-20 NOTE — Assessment & Plan Note (Signed)
Bone metastases from breast cancer. On Xgeva therapy. She is also taking calcium and vitamin D.  Supportive therapy plan is built.  Next Xgeva injection is due today.

## 2016-02-20 NOTE — Patient Instructions (Signed)
[place] at [facility] Discharge Instructions  RECOMMENDATIONS MADE BY THE CONSULTANT AND ANY TEST RESULTS WILL BE SENT TO YOUR REFERRING PHYSICIAN.  You were seen by Kirby Crigler, PA today. Xgeva today and monthly. Labs weekly. Call office tomorrow regarding your GERD. Return to clinic next week for follow-up appointment. Call clinic with any questions or concerns.   Thank you for choosing [place] at [facility] to provide your oncology and hematology care.  To afford each patient quality time with our provider, please arrive at least 15 minutes before your scheduled appointment time.   Beginning January 23rd 2017 lab work for the [facility] will be done in the  Main lab at [facility] on 1st floor. If you have a lab appointment with the [facility] please come in thru the  Main Entrance and check in at the main information desk  You need to re-schedule your appointment should you arrive 10 or more minutes late.  We strive to give you quality time with our providers, and arriving late affects you and other patients whose appointments are after yours.  Also, if you no show three or more times for appointments you may be dismissed from the clinic at the providers discretion.     Again, thank you for choosing [facility].  Our hope is that these requests will decrease the amount of time that you wait before being seen by our physicians.       _____________________________________________________________  Should you have questions after your visit to [facility], please contact our office at [phone] between the hours of 8:30 a.m. and 4:30 p.m.  Voicemails left after 4:30 p.m. will not be returned until the following business day.  For prescription refill requests, have your pharmacy contact our office.         Resources For Cancer Patients and their Caregivers ? American Cancer Society: Can assist with  transportation, wigs, general needs, runs Look Good Feel Better.        854-444-7332 ? Cancer Care: Provides financial assistance, online support groups, medication/co-pay assistance.  1-800-813-HOPE (780)147-6149) ? [place] Assists [place] cancer patients and their families through emotional , educational and financial support.  343-293-4302 ? [place] DSS Where to apply for food stamps, Medicaid and utility assistance. 847-581-6027 ? RCATS: Transportation to medical appointments. 332-709-1252 ? Social Security Administration: May apply for disability if have a Stage IV cancer. [phone] 2132248280 ? [employer], Disability and Transit Services: Assists with nutrition, care and transit needs. [place] Support Programs: @10RELATIVEDAYS @ > Cancer Support Group  2nd Tuesday of the month 1pm-2pm, Journey Room  > Creative Journey  3rd Tuesday of the month 1130am-1pm, Journey Room  > Look Good Feel Better  1st Wednesday of the month 10am-12 noon, Journey Room (Call [place] to register [phone])

## 2016-02-20 NOTE — Assessment & Plan Note (Addendum)
Squamous cell carcinoma of anus, HPV POSITIVE, undergoing XRT with Xeloda concomitantly in the neoadjuvant setting.  Initial medical oncology recommendations provided by Dr. Tressie Stalker at [facility].  Oncology history updated.  I was able to get a second opinion on pathology from [facility] pathology regarding diagnosis.  This is now available for review in [facility].  Labs today: CBC diff, CMET.  I personally reviewed and went over laboratory results with the patient.  The results are noted within this dictation.  Labs satisfy continued treatment parameters.  Dr. Tressie Stalker does dictate that there is a history of anal intercourse in the remote past.  Weekly labs: CBC diff, CMET.  She was started on Fentanyl 12.5 mcg/hr for pain control on Tuesday and her pain is improved, but could be better.  As a result, she is increasing her Fentanyl patch to 25 mcg/hr (two 12.5 mcg/hr patches).  She also is using 1/2 of a Hydrocodone or a Tramadol for breakthrough pain management.   Due to increased GERD symptoms with Xeloda, it is currently on hold with an increase in her PPI.  She will call tomorrow as planned with an update and further medical oncology recommendations regarding her Xeloda, as recommended by Dr. Whitney Muse earlier this week.  She notes that since holding Xeloda, and an increase in PPI, her symptoms have resolved.  She will call tomorrow AM.  She approximately 2 more weeks of XRT.  Return in 1 weeks for follow-up.  She knows to call with any issues related to her chemotherapy or side effects.

## 2016-02-20 NOTE — Patient Instructions (Addendum)
[place] at [facility] Discharge Instructions  RECOMMENDATIONS MADE BY THE CONSULTANT AND ANY TEST RESULTS WILL BE SENT TO YOUR REFERRING PHYSICIAN.  Distress screening done today. Port flush with labs  Office visit today with Kirby Crigler PA-C [place] calender given today.  X geva given today  Thank you for choosing [place] at [facility] to provide your oncology and hematology care.  To afford each patient quality time with our provider, please arrive at least 15 minutes before your scheduled appointment time.   Beginning January 23rd 2017 lab work for the [facility] will be done in the  Main lab at [facility] on 1st floor. If you have a lab appointment with the [facility] please come in thru the  Main Entrance and check in at the main information desk  You need to re-schedule your appointment should you arrive 10 or more minutes late.  We strive to give you quality time with our providers, and arriving late affects you and other patients whose appointments are after yours.  Also, if you no show three or more times for appointments you may be dismissed from the clinic at the providers discretion.     Again, thank you for choosing [facility].  Our hope is that these requests will decrease the amount of time that you wait before being seen by our physicians.       _____________________________________________________________  Should you have questions after your visit to [facility], please contact our office at [phone] between the hours of 8:30 a.m. and 4:30 p.m.  Voicemails left after 4:30 p.m. will not be returned until the following business day.  For prescription refill requests, have your pharmacy contact our office.         Resources For Cancer Patients and their Caregivers ? American Cancer Society: Can assist with transportation, wigs, general needs, runs Look Good Feel Better.         720-662-7851 ? Cancer Care: Provides financial assistance, online support groups, medication/co-pay assistance.  1-800-813-HOPE 4702319073) ? [place] Assists [place] cancer patients and their families through emotional , educational and financial support.  216-430-0255 ? [place] DSS Where to apply for food stamps, Medicaid and utility assistance. 321-504-8946 ? RCATS: Transportation to medical appointments. ([phone] ? Social Security Administration: May apply for disability if have a Stage IV cancer. 551-322-7073 [phone] ? [employer], Disability and Transit Services: Assists with nutrition, care and transit needs. [place] Support Programs: @10RELATIVEDAYS @ > Cancer Support Group  2nd Tuesday of the month 1pm-2pm, Journey Room  > Creative Journey  3rd Tuesday of the month 1130am-1pm, Journey Room  > Look Good Feel Better  1st Wednesday of the month 10am-12 noon, Journey Room (Call [place] to register [phone])

## 2016-02-20 NOTE — Assessment & Plan Note (Signed)
Stage IV breast cancer ER/PR positive, HER-2/neu negative. BRCA2 POSITIVE.  History of stage IIIc left breast cancer in 2009 treated with neoadjuvant chemotherapy consisting of epirubicin and Cytoxan followed by 4 cycles of docetaxel from 04/16/2008-08/13/2008 with the initiation of Herceptin for 52 weeks at the start of docetaxel. She then underwent a left mastectomy on 08/30/2008 showing persistent disease in the breast and lymph nodes. This was followed by radiation therapy from 09/17/2008-11/26/2008 to the left chest wall, supraclavicular, and axillary node areas. She then moved on to antiestrogen therapy with letrozole (11/27/2008-04/20/2011). She was found to have recurrent breast cancer on supraclavicular lymph node biopsy on 03/30/2011 leading to a change in antiestrogen therapy to tamoxifen (04/21/2011-06/04/2014). Pet imaging on 05/25/2014 demonstrated progression of left submandibular lymph node, mediastinal lymph node, and probable lung involvement. This was followed by a biopsy on 06/13/2014 of a left supraclavicular mass that did reveal metastatic carcinoma consistent with breast primary. She was then treated with carboplatin and Taxol for 6 cycles with an excellent response (06/25/2014-10/09/2014). She was then transition back to hormone therapy consisting of Ibrance and Faslodex (11/13/2014-06/10/2015). This was complicated by grade 3 neutropenia with cycle #2 resulting in a dose reduction of Ibrance to 100 mg for cycle 3. Unfortunately, on 06/10/2015, she was noted to have progression of disease in the right mid cervical lymph node over a 4 day period that was PET avid. Therapy was therefore changed to exemestane and everolimus (07/02/2015-08/01/2015) that was complicated by an adverse reaction consisting of diffuse pneumonitis. This regimen was therefore discontinued. She then transitioned back to systemic chemotherapy consisting of cisplatin every 14 days (08/23/2015-12/23/2015). She was put on a  drug holiday beginning on 12/23/2015 in preparation for a trip to [place] on 01/25/2016.  Her oncology history is up to date.  Currently, she has developed an anal cancer that will require local treatment consisting of chemoradiation. Treatment with Xeloda has been discussed and this can be utilized post local treatment for maintenance for her breast cancer.  She has seen Dr. Hyman Muss in the past for second opinion.  I did briefly discuss CODE STATUS given her incurable disease. At this time, she wishes to be a FULL CODE.  She is not unrealistic with regards to the incurability of her breast cancer. Performance status is excellent. In the future, we will need to readdress this issue as she declines. Fortunately, from a breast cancer perspective, her options are becoming more limited. 

## 2016-02-23 ENCOUNTER — Encounter (HOSPITAL_COMMUNITY): Payer: Self-pay | Admitting: Hematology & Oncology

## 2016-02-24 ENCOUNTER — Other Ambulatory Visit (HOSPITAL_COMMUNITY): Payer: Self-pay | Admitting: Oncology

## 2016-02-24 ENCOUNTER — Telehealth (HOSPITAL_COMMUNITY): Payer: Self-pay | Admitting: *Deleted

## 2016-02-24 ENCOUNTER — Telehealth (HOSPITAL_COMMUNITY): Payer: Self-pay | Admitting: Emergency Medicine

## 2016-02-24 DIAGNOSIS — F32A Depression, unspecified: Secondary | ICD-10-CM

## 2016-02-24 DIAGNOSIS — F329 Major depressive disorder, single episode, unspecified: Secondary | ICD-10-CM

## 2016-02-24 MED ORDER — ESCITALOPRAM OXALATE 10 MG PO TABS
15.0000 mg | ORAL_TABLET | Freq: Every day | ORAL | Status: DC
Start: 2016-02-24 — End: 2016-02-26

## 2016-02-24 NOTE — Telephone Encounter (Signed)
Escribed to [facility]

## 2016-02-24 NOTE — Telephone Encounter (Signed)
Returned phone call, informed patient that she could restart her xeloda. Patient reported still have a little problem with reflux, burning in chest. Patient is taking Malox and Protonix. Encouraged her to continue her meds for reflux and call us back if the reflux symptoms get worse.

## 2016-02-24 NOTE — Telephone Encounter (Signed)
Called pt to notify her that she should decrease her dose to 1150 mg BID Sunday PM to Friday AM.  Pt did take the normal dose this am but she will start the new dose this PM.

## 2016-02-24 NOTE — Telephone Encounter (Signed)
-----   Message from [facility], PA-C sent at 02/21/2016  6:01 PM EDT ----- She should restart her Xeloda as planned.  TK ----- Message -----    From: Epifanio Lesches    Sent: 02/21/2016   2:33 PM      To: [facility], PA-C, Patrici Ranks, MD, #  FYI Pt wanted to let us know that she has slight heartburn but its not bad at all. She will take maalox today and this weekend

## 2016-02-25 ENCOUNTER — Ambulatory Visit (HOSPITAL_COMMUNITY): Payer: BLUE CROSS/BLUE SHIELD | Admitting: Hematology & Oncology

## 2016-02-26 ENCOUNTER — Encounter (HOSPITAL_BASED_OUTPATIENT_CLINIC_OR_DEPARTMENT_OTHER): Payer: BLUE CROSS/BLUE SHIELD | Admitting: Oncology

## 2016-02-26 ENCOUNTER — Encounter (HOSPITAL_COMMUNITY): Payer: Self-pay | Admitting: Oncology

## 2016-02-26 ENCOUNTER — Encounter (HOSPITAL_COMMUNITY): Payer: BLUE CROSS/BLUE SHIELD

## 2016-02-26 VITALS — BP 135/45 | HR 58 | Resp 16 | Wt 142.2 lb

## 2016-02-26 DIAGNOSIS — C7951 Secondary malignant neoplasm of bone: Secondary | ICD-10-CM

## 2016-02-26 DIAGNOSIS — C210 Malignant neoplasm of anus, unspecified: Secondary | ICD-10-CM | POA: Diagnosis not present

## 2016-02-26 DIAGNOSIS — C50912 Malignant neoplasm of unspecified site of left female breast: Secondary | ICD-10-CM | POA: Diagnosis not present

## 2016-02-26 LAB — CBC WITH DIFFERENTIAL/PLATELET
Basophils Absolute: 0 10*3/uL (ref 0.0–0.1)
Basophils Relative: 0 %
Eosinophils Absolute: 0 10*3/uL (ref 0.0–0.7)
Eosinophils Relative: 0 %
HCT: 31.4 % — ABNORMAL LOW (ref 36.0–46.0)
Hemoglobin: 10.6 g/dL — ABNORMAL LOW (ref 12.0–15.0)
Lymphocytes Relative: 18 %
Lymphs Abs: 0.9 10*3/uL (ref 0.7–4.0)
MCH: 38.3 pg — ABNORMAL HIGH (ref 26.0–34.0)
MCHC: 33.8 g/dL (ref 30.0–36.0)
MCV: 113.4 fL — ABNORMAL HIGH (ref 78.0–100.0)
Monocytes Absolute: 0.4 10*3/uL (ref 0.1–1.0)
Monocytes Relative: 8 %
Neutro Abs: 3.6 10*3/uL (ref 1.7–7.7)
Neutrophils Relative %: 73 %
Platelets: 204 10*3/uL (ref 150–400)
RBC: 2.77 MIL/uL — ABNORMAL LOW (ref 3.87–5.11)
RDW: 12.1 % (ref 11.5–15.5)
WBC: 4.9 10*3/uL (ref 4.0–10.5)

## 2016-02-26 MED ORDER — CAPECITABINE 500 MG PO TABS: 1150.0000 mg | ORAL_TABLET | Freq: Two times a day (BID) | ORAL | Status: DC

## 2016-02-26 MED ORDER — CAPECITABINE 150 MG PO TABS: 150.0000 mg | ORAL_TABLET | Freq: Two times a day (BID) | ORAL | Status: DC

## 2016-02-26 MED ORDER — FENTANYL 25 MCG/HR TD PT72: 25.0000 ug | MEDICATED_PATCH | TRANSDERMAL | Status: DC

## 2016-02-26 NOTE — Assessment & Plan Note (Signed)
Bone metastases from breast cancer. On Xgeva therapy. She is also taking calcium and vitamin D.  Oncology Flowsheet 02/20/2016  denosumab (XGEVA) [place] 120 mg

## 2016-02-26 NOTE — Patient Instructions (Signed)
[place] at [facility] Discharge Instructions  RECOMMENDATIONS MADE BY THE CONSULTANT AND ANY TEST RESULTS WILL BE SENT TO YOUR REFERRING PHYSICIAN.  Exam done and seen today by Kirby Crigler Continue xeloda 1150mg  BID Labs weekly  Return to see the Doctor as scheduled Call the clinic for any concerns or questions.  Thank you for choosing [place] at [facility] to provide your oncology and hematology care.  To afford each patient quality time with our provider, please arrive at least 15 minutes before your scheduled appointment time.   Beginning January 23rd 2017 lab work for the [facility] will be done in the  Main lab at [facility] on 1st floor. If you have a lab appointment with the [facility] please come in thru the  Main Entrance and check in at the main information desk  You need to re-schedule your appointment should you arrive 10 or more minutes late.  We strive to give you quality time with our providers, and arriving late affects you and other patients whose appointments are after yours.  Also, if you no show three or more times for appointments you may be dismissed from the clinic at the providers discretion.     Again, thank you for choosing [facility].  Our hope is that these requests will decrease the amount of time that you wait before being seen by our physicians.       _____________________________________________________________  Should you have questions after your visit to [facility], please contact our office at [phone] between the hours of 8:30 a.m. and 4:30 p.m.  Voicemails left after 4:30 p.m. will not be returned until the following business day.  For prescription refill requests, have your pharmacy contact our office.         Resources For Cancer Patients and their Caregivers ? American Cancer Society: Can assist with transportation, wigs, general needs, runs Look Good  Feel Better.        ([phone] ? Cancer Care: Provides financial assistance, online support groups, medication/co-pay assistance.  1-800-813-HOPE 848-038-5948) ? [place] Assists [place] cancer patients and their families through emotional , educational and financial support.  204-060-3218 ? [place] DSS Where to apply for food stamps, Medicaid and utility assistance. 6506773854 ? RCATS: Transportation to medical appointments. 540-858-1811 ? Social Security Administration: May apply for disability if have a Stage IV cancer. [phone] ? [employer], Disability and Transit Services: Assists with nutrition, care and transit needs. [place] Support Programs: @10RELATIVEDAYS @ > Cancer Support Group  2nd Tuesday of the month 1pm-2pm, Journey Room  > Creative Journey  3rd Tuesday of the month 1130am-1pm, Journey Room  > Look Good Feel Better  1st Wednesday of the month 10am-12 noon, Journey Room (Call [place] to register [phone])

## 2016-02-26 NOTE — Progress Notes (Signed)
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  INTERVAL HISTORY: MEHEK GREGA 65 y.o. female returns for followup of Stage IV breast cancer ER/PR positive, HER-2/neu negative. BRCA2 POSITIVE. History of stage IIIc left breast cancer in 2009 treated with neoadjuvant chemotherapy consisting of epirubicin and Cytoxan followed by 4 cycles of docetaxel from 04/16/2008-08/13/2008 with the initiation of Herceptin for 52 weeks at the start of docetaxel. She then underwent a left mastectomy on 08/30/2008 showing persistent disease in the breast and lymph nodes. This was followed by radiation therapy from 09/17/2008-11/26/2008 to the left chest wall, supraclavicular, and axillary node areas. She then moved on to antiestrogen therapy with letrozole (11/27/2008-04/20/2011). She was found to have recurrent breast cancer on supraclavicular lymph node biopsy on 03/30/2011 leading to a change in antiestrogen therapy to tamoxifen (04/21/2011-06/04/2014). Pet imaging on 05/25/2014 demonstrated progression of left submandibular lymph node, mediastinal lymph node, and probable lung involvement. This was followed by a biopsy on 06/13/2014 of a left supraclavicular mass that did reveal metastatic carcinoma consistent with breast primary. She was then treated with carboplatin and Taxol for 6 cycles with an excellent response (06/25/2014-10/09/2014). She was then transition back to hormone therapy consisting of Ibrance and Faslodex (11/13/2014-06/10/2015). This was complicated by grade 3 neutropenia with cycle #2 resulting in a dose reduction of Ibrance to 100 mg for cycle 3. Unfortunately, on 06/10/2015,  she was noted to have progression of disease in the right mid cervical lymph node over a 4 day period that was PET avid. Therapy was therefore changed to exemestane and everolimus (14/05/3012-14/38/8875) that was complicated by an adverse reaction consisting of diffuse pneumonitis. This regimen was therefore discontinued. She then transitioned back to systemic chemotherapy consisting of cisplatin every 14 days (08/23/2015-12/23/2015). She was put on a drug holiday beginning on 12/23/2015 in preparation for a trip to [place] on 01/25/2016. AND newly diagnosed squamous cell carcinoma of anus, HPV POSITIVE, undergoing XRT with Xeloda concomitantly in the neoadjuvant setting.     Adenocarcinoma of left breast   04/09/2008 Pathology Results Positive for breast cancer, ER 100%, PR 5%, HER-2/neu POSITIVE.   04/09/2008 Procedure Biopsy of left axillary node mass    04/16/2008 - 08/13/2008 Chemotherapy Dose dense epirubicin/Cytoxan 4 cycles followed by docetaxel 4 cycles. Herceptin initiated with docetaxel for a total of 52 weeks.   08/30/2008 Definitive Surgery Left mastectomy revealing persistence of disease in the breast and lymph nodes.   09/17/2008 - 11/26/2008 Radiation Therapy Radiation to left chest wall, supraclavicular, and axillary nodal areas.   11/27/2008 - 04/20/2011 Anti-estrogen oral therapy Letrozole   03/30/2011 Procedure Left supraclavicular lymph node biopsy   03/30/2011 Pathology Results Recurrent breast cancer, invasive ductal type, ER 100%, PR 40%, HER-2 NEGATIVE   04/21/2011 - 06/04/2014 Anti-estrogen oral therapy Tamoxifen   05/25/2014 PET scan Progression in left submandibular lymph node, mediastinal lymph nodes, and probable lung involvement.   05/25/2014 Progression PET scan demonstrates progression of disease.   06/13/2014 Procedure Left supraclavicular mass biopsy   06/13/2014 Pathology Results Metastatic carcinoma consistent with breast primary, involving skeletal muscle, perineural  invasion, LVI, no lymphoid tissue identified, ER 98%, PR negative, HER-2 negative   06/25/2014 -  10/09/2014 Chemotherapy Carboplatin/Taxol 6 cycles with excellent response   11/06/2014 - 06/10/2015 Chemotherapy Palbociclib and Faslodex   01/08/2015 Adverse Reaction Grade 3 neutropenia with cycle #2 of Palbociclib.   01/08/2015 Treatment Plan Change Palbociclib dose reduced to 100 mg for cycle #3.   06/10/2015 Progression Right cervical lymph node progression clinically developed over 4 days.  This lymph node was positive on PET scan in September 2016 and at that time was approximately 8 mm with possible progression in a single bone metastasis.   06/17/2015 Procedure Biopsy of right cervical lymph node   06/17/2015 Pathology Results Genetic sequencing performed revealing BRCA2 positivity   07/02/2015 - 08/01/2015 Chemotherapy Exemestane/everolimus, 25 mg/ 5 mg x 7 days then increasing to 10 mg daily.   08/01/2015 PET scan Diffuse pneumonitis.     08/01/2015 Adverse Reaction Pneumonitis secondary to drug reaction from everolimus.   08/23/2015 - 12/23/2015 Chemotherapy Cisplatin 40 mg/m every other week   11/15/2015 PET scan Interval resolution of hypermetabolic airspace opacities in the lungs which were likely inflammatory. Abnormal high activity at the anal rectal junction. Very low grade residual activity at the site of L3 metastatic lesion.   12/23/2015 Treatment Plan Change Holiday from treatment in preparation for trip to [place].    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 Britta Mccreedy   12/31/2015 Pathology Results Invasive squamous cell carcinoma approximate 4 cm mass posterior wall of rectum   01/13/2016 Treatment Plan Change Dr. Leighton Ruff consultation- no surgery.  Recommend chemoXRT   01/16/2016 Pathology Results HPV POSITIVE   02/07/2016 -  Chemotherapy Xeloda 1450 mg PO BID Sunday PM- Friday AM during XRT   02/07/2016 -  Radiation Therapy    02/19/2016 Treatment  Plan Change Xeloda on hold due to increased GERD symtpoms.  PPI dose increased.  Will restart Xeloda tomorrow, 02/21/2016.   02/23/2016 Treatment Plan Change Xeloda dose decreased to 1150 mg PO BID Sunday PM- Friday AM during XRT    She is doing well on the reduced dose.  Her GERD is much improved.  She is tolerating the lower dose of Xeloda without any complaints.  She denies any increased diarrhea, stomatitis, or palmar-plantar erythrodysesthesia  Review of Systems  Constitutional: Negative.   HENT: Negative.   Eyes: Negative.   Respiratory: Negative.   Cardiovascular: Negative.   Gastrointestinal: Negative.   Genitourinary: Negative.   Musculoskeletal: Negative.   Skin: Negative.   Neurological: Negative.   Endo/Heme/Allergies: Negative.   Psychiatric/Behavioral: Negative.     Past Medical History  Diagnosis Date  . Osteoporosis 01/27/2011  . Borderline hypertension   . Pneumonia   . Bladder infection   . Fatigue   . Generalized headaches   . Wears glasses   . Swollen lymph nodes   . Retinal detachment   . Reflux   . URI (upper respiratory infection) 02/17/12  . Adenocarcinoma of breast ([place]) 01/27/2011    stage IIIc breast cancer  . Breast cancer ([place])   . Post-mastectomy lymphedema syndrome   . Closed fracture of humerus sept 2013  . Port catheter in place 04/14/2013  . 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 Bone metastasis ([place]) 01/17/2016  . 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 Recurrent UTI 01/17/2016    Past Surgical History  Procedure Laterality Date  . Cataract extraction    . Cervical disc fusioni    . Retinal detachment and repair    . Btl    .  Mastectomy modified radical    . Port-a-cath removal      in place (not removed)  . Biopsy of lymph node      super clavicle  . Breast surgery    . Lymph node biopsy Right 06/19/2015    Procedure: RIGHT NECK LYMPH NODE EXCISION;  Surgeon: Rolm Bookbinder, MD;  Location: [facility];  Service: General;  Laterality: Right;    Family History  Problem Relation Age of Onset  . Hypertension Mother   . Heart disease Mother     atrial fib  . Heart disease Father   . Other Father     glaucoma    Social History   Social History  . Marital Status: Divorced    Spouse Name: N/A  . Number of Children: N/A  . Years of Education: N/A   Social History Main Topics  . Smoking status: Never Smoker   . Smokeless tobacco: Never Used  . Alcohol Use: 4.2 oz/week    7 Glasses of wine per week  . Drug Use: No  . Sexual Activity: Not Asked   Other Topics Concern  . None   Social History Narrative     PHYSICAL EXAMINATION  ECOG PERFORMANCE STATUS: 1 - Symptomatic but completely ambulatory  Filed Vitals:   02/26/16 1047  BP: 135/45  Pulse: 58  Resp: 16    GENERAL:alert, no distress, well nourished, well developed, comfortable, cooperative, smiling and unaccompanied SKIN: skin color, texture, turgor are normal, no rashes or significant lesions HEAD: Normocephalic, No masses, lesions, tenderness or abnormalities EYES: normal, EOMI, Conjunctiva are pink and non-injected EARS: External ears normal OROPHARYNX:lips, buccal mucosa, and tongue normal and mucous membranes are moist  NECK: supple, trachea midline LYMPH:  no palpable lymphadenopathy BREAST:not examined LUNGS: clear to auscultation and percussion HEART: regular rate & rhythm, no murmurs and no gallops ABDOMEN:abdomen soft, non-tender, normal bowel sounds and no masses or organomegaly BACK: Back symmetric, no curvature. EXTREMITIES:less then 2 second capillary refill, no joint deformities, effusion, or inflammation, no skin discoloration, no cyanosis  NEURO: alert & oriented x 3 with fluent speech, no focal motor/sensory deficits, gait normal   LABORATORY DATA: CBC    Component Value Date/Time   WBC 4.9 02/26/2016 1000   RBC 2.77* 02/26/2016 1000   HGB 10.6* 02/26/2016 1000   HCT 31.4*  02/26/2016 1000   PLT 204 02/26/2016 1000   MCV 113.4* 02/26/2016 1000   MCH 38.3* 02/26/2016 1000   MCHC 33.8 02/26/2016 1000   RDW 12.1 02/26/2016 1000   LYMPHSABS 0.9 02/26/2016 1000   MONOABS 0.4 02/26/2016 1000   EOSABS 0.0 02/26/2016 1000   BASOSABS 0.0 02/26/2016 1000      Chemistry      Component Value Date/Time   NA 137 02/20/2016 1015   K 4.0 02/20/2016 1015   CL 106 02/20/2016 1015   CO2 25 02/20/2016 1015   BUN 18 02/20/2016 1015   CREATININE 0.93 02/20/2016 1015      Component Value Date/Time   CALCIUM 9.0 02/20/2016 1015   ALKPHOS 54 02/20/2016 1015   AST 21 02/20/2016 1015   ALT 15 02/20/2016 1015   BILITOT 0.4 02/20/2016 1015        PENDING LABS:   RADIOGRAPHIC STUDIES:  No results found.   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  Initial medical  oncology recommendations provided by Dr. [facility] Stalk[facility] at [facility].  Dr. Tressie Stalker does dictate that there is a history of anal intercourse in the remote past.  Oncology history updated.  Xeloda dose has been reduced to 1150 mg BID Sunday PM- Friday AM during XRT.  New Rx is printed and given to Lendell Caprice to update dosing change.  Labs today: CBC diff, CMET.  I personally reviewed and went over laboratory results with the patient.  The results are noted within this dictation.  Labs satisfy continued treatment parameters.  Weekly labs: CBC diff, CMET while undergoing treatment.  She is on Fentanyl 25 mcg with breakthrough pain management.  She notes that her pain is well controlled on current pain regimen.  She denies any skin breakdown at radiation site.  She denies any significant pain.  Fentanyl is refilled at 25 mcg/hr.  Her bowels are moving well and she is using a bowel regimen to maintain soft BMs.  Return in 1 weeks for follow-up.  She knows to call  with any issues related to her chemotherapy or side effects.    Adenocarcinoma of left breast Stage IV breast cancer ER/PR positive, HER-2/neu negative. BRCA2 POSITIVE.  History of stage IIIc left breast cancer in 2009 treated with neoadjuvant chemotherapy consisting of epirubicin and Cytoxan followed by 4 cycles of docetaxel from 04/16/2008-08/13/2008 with the initiation of Herceptin for 52 weeks at the start of docetaxel. She then underwent a left mastectomy on 08/30/2008 showing persistent disease in the breast and lymph nodes. This was followed by radiation therapy from 09/17/2008-11/26/2008 to the left chest wall, supraclavicular, and axillary node areas. She then moved on to antiestrogen therapy with letrozole (11/27/2008-04/20/2011). She was found to have recurrent breast cancer on supraclavicular lymph node biopsy on 03/30/2011 leading to a change in antiestrogen therapy to tamoxifen (04/21/2011-06/04/2014). Pet imaging on 05/25/2014 demonstrated progression of left submandibular lymph node, mediastinal lymph node, and probable lung involvement. This was followed by a biopsy on 06/13/2014 of a left supraclavicular mass that did reveal metastatic carcinoma consistent with breast primary. She was then treated with carboplatin and Taxol for 6 cycles with an excellent response (06/25/2014-10/09/2014). She was then transition back to hormone therapy consisting of Ibrance and Faslodex (11/13/2014-06/10/2015). This was complicated by grade 3 neutropenia with cycle #2 resulting in a dose reduction of Ibrance to 100 mg for cycle 3. Unfortunately, on 06/10/2015, she was noted to have progression of disease in the right mid cervical lymph node over a 4 day period that was PET avid. Therapy was therefore changed to exemestane and everolimus (06/03/8526-78/24/2353) that was complicated by an adverse reaction consisting of diffuse pneumonitis. This regimen was therefore discontinued. She then transitioned back to  systemic chemotherapy consisting of cisplatin every 14 days (08/23/2015-12/23/2015). She was put on a drug holiday beginning on 12/23/2015 in preparation for a trip to [place] on 01/25/2016.  Her oncology history is up to date.  Currently, she has developed an anal cancer that will require local treatment consisting of chemoradiation. Treatment with Xeloda has been discussed and this can be utilized post local treatment for maintenance for her breast cancer.  She has seen Dr. Mauro Kaufmann Muss in the past for second opinion.  I did briefly discuss CODE STATUS given her incurable disease. At this time, she wishes to be a FULL CODE.  She is not unrealistic with regards to the incurability of her breast cancer. Performance status is excellent. In the future, we will need to readdress this issue as she  declines. Fortunately, from a breast cancer perspective, her options are becoming more limited.  Bone metastasis (HCC) Bone metastases from breast cancer. On Xgeva therapy. She is also taking calcium and vitamin D.  Oncology Flowsheet 02/20/2016  denosumab (XGEVA) [place] 120 mg      ORDERS PLACED FOR THIS ENCOUNTER: No orders of the defined types were placed in this encounter.    MEDICATIONS PRESCRIBED THIS ENCOUNTER: Meds ordered this encounter  Medications  . fentaNYL (DURAGESIC - DOSED MCG/HR) 25 MCG/HR patch    Sig: Place 1 patch (25 mcg total) onto the skin every 3 (three) days.    Dispense:  10 patch    Refill:  0    Order Specific Question:  Supervising Provider    Answer:  Patrici Ranks [id number]  . capecitabine (XELODA) 500 MG tablet    Sig: Take 2 tablets (1,000 mg total) by mouth 2 (two) times daily after a meal. Take 1150 mg PO BID Sunday PM- Friday AM during XRT    Dispense:  60 tablet    Refill:  0    Order Specific Question:  Supervising Provider    Answer:  Patrici Ranks [id number]  . capecitabine (XELODA) 150 MG tablet    Sig: Take 1 tablet (150 mg total) by mouth 2  (two) times daily after a meal. Take 1150 mg PO BID Sunday PM- Friday AM during XRT.    Dispense:  30 tablet    Refill:  0    Order Specific Question:  Supervising Provider    Answer:  Patrici Ranks [id number]    THERAPY PLAN:  Continue Xeloda with XRT as planned.  All questions were answered. The patient knows to call the clinic with any problems, questions or concerns. We can certainly see the patient much sooner if necessary.  Patient and plan discussed with Dr. Ancil Linsey and she is in agreement with the aforementioned.   This note is electronically signed by: Doy Mince 02/26/2016 10:12 PM

## 2016-02-26 NOTE — Assessment & Plan Note (Addendum)
Squamous cell carcinoma of anus, HPV POSITIVE, undergoing XRT with Xeloda concomitantly in the neoadjuvant setting.  Initial medical oncology recommendations provided by Dr. Tressie Stalker at [facility].  Dr. Tressie Stalker does dictate that there is a history of anal intercourse in the remote past.  Oncology history updated.  Xeloda dose has been reduced to 1150 mg BID Sunday PM- Friday AM during XRT.  New Rx is printed and given to Lendell Caprice to update dosing change.  Labs today: CBC diff, CMET.  I personally reviewed and went over laboratory results with the patient.  The results are noted within this dictation.  Labs satisfy continued treatment parameters.  Weekly labs: CBC diff, CMET while undergoing treatment.  She is on Fentanyl 25 mcg with breakthrough pain management.  She notes that her pain is well controlled on current pain regimen.  She denies any skin breakdown at radiation site.  She denies any significant pain.  Fentanyl is refilled at 25 mcg/hr.  Her bowels are moving well and she is using a bowel regimen to maintain soft BMs.  Return in 1 weeks for follow-up.  She knows to call with any issues related to her chemotherapy or side effects.

## 2016-02-26 NOTE — Assessment & Plan Note (Signed)
Stage IV breast cancer ER/PR positive, HER-2/neu negative. BRCA2 POSITIVE.  History of stage IIIc left breast cancer in 2009 treated with neoadjuvant chemotherapy consisting of epirubicin and Cytoxan followed by 4 cycles of docetaxel from 04/16/2008-08/13/2008 with the initiation of Herceptin for 52 weeks at the start of docetaxel. She then underwent a left mastectomy on 08/30/2008 showing persistent disease in the breast and lymph nodes. This was followed by radiation therapy from 09/17/2008-11/26/2008 to the left chest wall, supraclavicular, and axillary node areas. She then moved on to antiestrogen therapy with letrozole (11/27/2008-04/20/2011). She was found to have recurrent breast cancer on supraclavicular lymph node biopsy on 03/30/2011 leading to a change in antiestrogen therapy to tamoxifen (04/21/2011-06/04/2014). Pet imaging on 05/25/2014 demonstrated progression of left submandibular lymph node, mediastinal lymph node, and probable lung involvement. This was followed by a biopsy on 06/13/2014 of a left supraclavicular mass that did reveal metastatic carcinoma consistent with breast primary. She was then treated with carboplatin and Taxol for 6 cycles with an excellent response (06/25/2014-10/09/2014). She was then transition back to hormone therapy consisting of Ibrance and Faslodex (11/13/2014-06/10/2015). This was complicated by grade 3 neutropenia with cycle #2 resulting in a dose reduction of Ibrance to 100 mg for cycle 3. Unfortunately, on 06/10/2015, she was noted to have progression of disease in the right mid cervical lymph node over a 4 day period that was PET avid. Therapy was therefore changed to exemestane and everolimus (07/02/2015-08/01/2015) that was complicated by an adverse reaction consisting of diffuse pneumonitis. This regimen was therefore discontinued. She then transitioned back to systemic chemotherapy consisting of cisplatin every 14 days (08/23/2015-12/23/2015). She was put on a  drug holiday beginning on 12/23/2015 in preparation for a trip to [place] on 01/25/2016.  Her oncology history is up to date.  Currently, she has developed an anal cancer that will require local treatment consisting of chemoradiation. Treatment with Xeloda has been discussed and this can be utilized post local treatment for maintenance for her breast cancer.  She has seen Dr. Hyman Muss in the past for second opinion.  I did briefly discuss CODE STATUS given her incurable disease. At this time, she wishes to be a FULL CODE.  She is not unrealistic with regards to the incurability of her breast cancer. Performance status is excellent. In the future, we will need to readdress this issue as she declines. Fortunately, from a breast cancer perspective, her options are becoming more limited. 

## 2016-03-03 ENCOUNTER — Encounter: Payer: Self-pay | Admitting: Interventional Cardiology

## 2016-03-06 ENCOUNTER — Encounter (HOSPITAL_COMMUNITY): Payer: Self-pay | Admitting: Oncology

## 2016-03-06 ENCOUNTER — Encounter (HOSPITAL_BASED_OUTPATIENT_CLINIC_OR_DEPARTMENT_OTHER): Payer: BLUE CROSS/BLUE SHIELD | Admitting: Oncology

## 2016-03-06 ENCOUNTER — Encounter (HOSPITAL_COMMUNITY): Payer: BLUE CROSS/BLUE SHIELD

## 2016-03-06 DIAGNOSIS — C210 Malignant neoplasm of anus, unspecified: Secondary | ICD-10-CM | POA: Diagnosis not present

## 2016-03-06 DIAGNOSIS — C7951 Secondary malignant neoplasm of bone: Secondary | ICD-10-CM

## 2016-03-06 DIAGNOSIS — C50912 Malignant neoplasm of unspecified site of left female breast: Secondary | ICD-10-CM | POA: Diagnosis not present

## 2016-03-06 LAB — MAGNESIUM: Magnesium: 1.7 mg/dL (ref 1.7–2.4)

## 2016-03-06 LAB — COMPREHENSIVE METABOLIC PANEL
ALT: 14 U/L (ref 14–54)
AST: 23 U/L (ref 15–41)
Albumin: 3.7 g/dL (ref 3.5–5.0)
Alkaline Phosphatase: 54 U/L (ref 38–126)
Anion gap: 5 (ref 5–15)
BUN: 15 mg/dL (ref 6–20)
CO2: 25 mmol/L (ref 22–32)
Calcium: 8.9 mg/dL (ref 8.9–10.3)
Chloride: 106 mmol/L (ref 101–111)
Creatinine, Ser: 0.92 mg/dL (ref 0.44–1.00)
GFR calc Af Amer: >60 mL/min (ref >60)
GFR calc non Af Amer: >60 mL/min (ref >60)
Glucose, Bld: 103 mg/dL — ABNORMAL HIGH (ref 65–99)
Potassium: 4 mmol/L (ref 3.5–5.1)
Sodium: 136 mmol/L (ref 135–145)
Total Bilirubin: 0.4 mg/dL (ref 0.3–1.2)
Total Protein: 6.4 g/dL — ABNORMAL LOW (ref 6.5–8.1)

## 2016-03-06 LAB — CBC WITH DIFFERENTIAL/PLATELET
Basophils Absolute: 0 10*3/uL (ref 0.0–0.1)
Basophils Relative: 0 %
Eosinophils Absolute: 0.1 10*3/uL (ref 0.0–0.7)
Eosinophils Relative: 2 %
HCT: 30.8 % — ABNORMAL LOW (ref 36.0–46.0)
Hemoglobin: 10.4 g/dL — ABNORMAL LOW (ref 12.0–15.0)
Lymphocytes Relative: 20 %
Lymphs Abs: 0.9 10*3/uL (ref 0.7–4.0)
MCH: 38.2 pg — ABNORMAL HIGH (ref 26.0–34.0)
MCHC: 33.8 g/dL (ref 30.0–36.0)
MCV: 113.2 fL — ABNORMAL HIGH (ref 78.0–100.0)
Monocytes Absolute: 0.4 10*3/uL (ref 0.1–1.0)
Monocytes Relative: 8 %
Neutro Abs: 3.3 10*3/uL (ref 1.7–7.7)
Neutrophils Relative %: 71 %
Platelets: 169 10*3/uL (ref 150–400)
RBC: 2.72 MIL/uL — ABNORMAL LOW (ref 3.87–5.11)
RDW: 12.6 % (ref 11.5–15.5)
WBC: 4.7 10*3/uL (ref 4.0–10.5)

## 2016-03-06 MED ORDER — CAPECITABINE 500 MG PO TABS: 1150.0000 mg | ORAL_TABLET | Freq: Two times a day (BID) | ORAL | 0 refills | Status: DC

## 2016-03-06 MED ORDER — CAPECITABINE 150 MG PO TABS: 150.0000 mg | ORAL_TABLET | Freq: Two times a day (BID) | ORAL | 0 refills | Status: DC

## 2016-03-06 NOTE — Patient Instructions (Signed)
[place] at [facility] Discharge Instructions  RECOMMENDATIONS MADE BY THE CONSULTANT AND ANY TEST RESULTS WILL BE SENT TO YOUR REFERRING PHYSICIAN.  You were seen by Gershon Mussel today. Return to clinic as scheduled with labs.  Thank you for choosing [place] at [facility] to provide your oncology and hematology care.  To afford each patient quality time with our provider, please arrive at least 15 minutes before your scheduled appointment time.   Beginning January 23rd 2017 lab work for the [facility] will be done in the  Main lab at [facility] on 1st floor. If you have a lab appointment with the [facility] please come in thru the  Main Entrance and check in at the main information desk  You need to re-schedule your appointment should you arrive 10 or more minutes late.  We strive to give you quality time with our providers, and arriving late affects you and other patients whose appointments are after yours.  Also, if you no show three or more times for appointments you may be dismissed from the clinic at the providers discretion.     Again, thank you for choosing [facility].  Our hope is that these requests will decrease the amount of time that you wait before being seen by our physicians.       _____________________________________________________________  Should you have questions after your visit to [facility], please contact our office at [phone] between the hours of 8:30 a.m. and 4:30 p.m.  Voicemails left after 4:30 p.m. will not be returned until the following business day.  For prescription refill requests, have your pharmacy contact our office.         Resources For Cancer Patients and their Caregivers ? American Cancer Society: Can assist with transportation, wigs, general needs, runs Look Good Feel Better.        216-627-7135 ? Cancer Care: Provides financial assistance, online support  groups, medication/co-pay assistance.  1-800-813-HOPE 772-178-6168) ? [facility] Assists [place] cancer patients and their families through emotional , educational and financial support.  930-500-2062 ? [place] DSS Where to apply for food stamps, Medicaid and utility assistance. 845-697-2933 ? [facility]: Transportation to medical appointments. (712)488-2493 ? Social Security Administration: May apply for disability if have a Stage IV cancer. 860-635-5389 308-870-7200 ? [employer], Disability and Transit Services: Assists with nutrition, care and transit needs. [place] Support Programs: @10RELATIVEDAYS @ > Cancer Support Group  2nd Tuesday of the month 1pm-2pm, Journey Room  > Creative Journey  3rd Tuesday of the month 1130am-1pm, Journey Room  > Look Good Feel Better  1st Wednesday of the month 10am-12 noon, Journey Room (Call [place] to register [phone])

## 2016-03-06 NOTE — Assessment & Plan Note (Signed)
Bone metastases from breast cancer. On Xgeva therapy. She is also taking calcium and vitamin D.  Oncology Flowsheet 02/20/2016  denosumab (XGEVA)  120 mg

## 2016-03-06 NOTE — Assessment & Plan Note (Addendum)
Squamous cell carcinoma of anus, HPV POSITIVE, undergoing XRT with Xeloda concomitantly in the neoadjuvant setting.  Initial medical oncology recommendations provided by Dr. Tressie Stalker at [facility].  Dr. Tressie Stalker does dictate that there is a history of anal intercourse in the remote past.  Oncology history updated.  Xeloda dose has been reduced to 1150 mg BID Sunday PM- Friday AM during XRT.  She reports that the XRT machine was down yesterday and therefore, she will be finishing on Tuesday.  She notes that she will be out of her Xeloda, but a new Rx was printed for her dose adjustment on 02/26/2016.  I will print a new Rx.  She is advised to accept her new Xeloda Rx, even if it is 1450 mg that is delivered, as she can make her 1150 mg from that dose.  She is advised on how to do this.  Labs today: CBC diff, CMET.  I personally reviewed and went over laboratory results with the patient.  The results are noted within this dictation.  She notes that she is on magnesium at home for hypomagnesemia that was treated by Dr. Tressie Stalker.  I will see if we can add a magnesium level to today's already drawn labs.  Weekly labs: CBC diff, CMET while undergoing treatment.  Magnesium is added for 8/10 lab appointment.  She is on Fentanyl 25 mcg with breakthrough pain management.  She notes that her pain is well controlled on current pain regimen.  She denies any skin breakdown at radiation site.  She is educated on a fentanyl taper when discomfort starts to improve from XRT.  Her bowels are moving well and she is using a bowel regimen to maintain soft BMs.  Return as scheduled for follow-up, labs, and Xgeva.  She knows to call with any issues related to her chemotherapy or side effects.

## 2016-03-06 NOTE — Progress Notes (Signed)
Jennifer Neighbors, MD [place] [address]  Hypomagnesemia - Plan: Magnesium, capecitabine (XELODA) 500 MG tablet, capecitabine (XELODA) 150 MG tablet, Magnesium, Magnesium, Magnesium  Squamous cell carcinoma of anus (HCC) - Plan: capecitabine (XELODA) 500 MG tablet, capecitabine (XELODA) 150 MG tablet  Adenocarcinoma of breast, left (HCC)  Bone metastasis (HCC)  CURRENT THERAPY: Xeloda 1150 mg BID _0 /16/2015 PET scan    Progression in left submandibular  lymph node, mediastinal lymph nodes, and probable lung involvement.     05/25/2014 Progression    PET scan demonstrates progression of disease.     06/13/2014  Procedure    Left supraclavicular mass biopsy     06/13/2014 Pathology Results    Metastatic carcinoma consistent with breast primary, involving skeletal muscle, perineural invasion, LVI, no lymphoid tissue identified, ER 98%, PR negative, HER-2 negative     06/25/2014 - 10/09/2014 Chemotherapy    Carboplatin/Taxol 6 cycles with excellent response     11/06/2014 - 06/10/2015 Chemotherapy    Palbociclib and Faslodex     01/08/2015 Adverse Reaction    Grade 3 neutropenia with cycle #2 of Palbociclib.     01/08/2015 Treatment Plan Change    Palbociclib dose reduced to 100 mg for cycle #3.     06/10/2015 Progression    Right cervical lymph node progression clinically developed over 4 days.  This lymph node was positive on PET scan in September 2016 and at that time was approximately 8 mm with possible progression in a single bone metastasis.     06/17/2015 Procedure    Biopsy of right cervical lymph node     06/17/2015 Pathology Results    Genetic sequencing performed revealing BRCA2 positivity     07/02/2015 - 08/01/2015 Chemotherapy    Exemestane/everolimus, 25 mg/ 5 mg x 7 days then increasing to 10 mg daily.     08/01/2015 PET scan    Diffuse pneumonitis.       08/01/2015 Adverse Reaction    Pneumonitis secondary to drug reaction from everolimus.     08/23/2015 - 12/23/2015 Chemotherapy    Cisplatin 40 mg/m every other week     11/15/2015 PET scan    Interval resolution of hypermetabolic airspace opacities in the lungs which were likely inflammatory. Abnormal high activity at the anal rectal junction. Very low grade residual activity at the site of L3 metastatic lesion.     12/23/2015 Treatment Plan Change    Holiday from treatment in preparation for trip to [place].      Squamous cell carcinoma of anus ([place])   12/31/2015 Procedure    Colonoscopy by Dr. Britta Mccreedy     12/31/2015 Pathology Results    Invasive squamous cell carcinoma approximate 4 cm mass posterior wall of rectum       01/13/2016 Treatment Plan Change    Dr. Leighton Ruff consultation- no surgery.  Recommend chemoXRT     01/16/2016 Pathology Results    HPV POSITIVE     02/07/2016 -  Chemotherapy    Xeloda 1450 mg PO BID Sunday PM- Friday AM during XRT     02/07/2016 -  Radiation Therapy         02/19/2016 Treatment Plan Change    Xeloda on hold due to increased GERD symtpoms.  PPI dose increased.  Will restart Xeloda tomorrow, 02/21/2016.     02/23/2016 Treatment Plan Change    Xeloda dose decreased to 1150 mg PO BID Sunday PM- Friday AM during XRT      She continues to tolerate treatment well.  She denies any open areas on her bottom.  She notes some internal burning.  She is moving her bowel well and maintaining soft/loose stools with bowel regimen.  She denies any increase in frequency of BMs.  She denies any stomatits or mouth sores.  She denies any palmar-plantar erythrodysesthesia signs/symptoms.    Pain is well controlled on current pain regimen.  Review of Systems  Constitutional: Negative.  Negative for chills, fever and weight loss.  HENT: Negative.   Eyes: Negative.   Respiratory: Negative.   Cardiovascular: Negative.   Gastrointestinal: Negative.  Negative for abdominal pain, blood in stool, constipation, diarrhea (loose stools), melena, nausea and vomiting.  Genitourinary: Negative.   Musculoskeletal: Negative.   Skin: Negative.   Neurological: Negative.  Negative for weakness.  Endo/Heme/Allergies: Negative.   Psychiatric/Behavioral: Negative.     Past Medical History:  Diagnosis Date  . Adenocarcinoma of breast ([facility]) 01/27/2011   stage IIIc breast cancer  . Bladder infection   . Bone metastasis ([facility]) 01/17/2016  . Borderline hypertension   . Breast cancer ([place])   . Closed fracture of humerus sept 2013  . Fatigue   . Generalized headaches   . Osteoporosis 01/27/2011  . Pneumonia   . Port catheter in place 04/14/2013  . Post-mastectomy lymphedema syndrome   . Recurrent UTI  01/17/2016  . Reflux   . Retinal detachment   . 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 Swollen lymph nodes   . URI (upper respiratory infection) 02/17/12  . Wears glasses     Past Surgical History:  Procedure Laterality Date  . biopsy of lymph node     super clavicle  . BREAST SURGERY    . BTL    . CATARACT EXTRACTION    . cervical disc fusioni    . LYMPH NODE BIOPSY Right 06/19/2015   Procedure: RIGHT NECK LYMPH NODE EXCISION;  Surgeon: Rolm Bookbinder, MD;  Location: [facility];  Service: General;  Laterality: Right;  . MASTECTOMY MODIFIED RADICAL    . PORT-A-CATH REMOVAL     in place (not removed)  . retinal detachment and repair      Family History  Problem Relation Age of Onset  . Hypertension Mother   . Heart disease Mother     atrial fib  . Heart disease Father   . Other Father     glaucoma    Social History   Social History  . Marital status: Divorced    Spouse name: N/A  . Number of children: N/A  . Years of education: N/A   Social History Main Topics  . Smoking status: Never Smoker  . Smokeless tobacco: Never Used  . Alcohol use 4.2 oz/week    7 Glasses of wine per week  . Drug use: No  . Sexual activity: Not Asked   Other Topics Concern  . None   Social History Narrative  . None     PHYSICAL EXAMINATION  ECOG PERFORMANCE STATUS: 1 - Symptomatic but completely ambulatory  There were no vitals filed for this visit.  Blood pressure 131/41 Pulse 62 Respirations 18 Temperature 98.8 Oxygen saturation 98% on room air  GENERAL:alert, no distress, well nourished, well developed, comfortable, cooperative, smiling and unaccompanied SKIN: skin color, texture, turgor are normal, no rashes or significant lesions HEAD: Normocephalic, No masses, lesions, tenderness or abnormalities EYES: normal, EOMI, Conjunctiva are pink and non-injected EARS: External ears  normal OROPHARYNX:lips, buccal mucosa, and tongue normal and mucous membranes are moist  NECK: supple, trachea midline LYMPH:  no palpable lymphadenopathy BREAST:not examined LUNGS: clear to auscultation and percussion HEART: regular rate & rhythm, no murmurs and no gallops ABDOMEN:abdomen soft, non-tender, normal bowel sounds and no masses or organomegaly BACK: Back symmetric, no curvature. EXTREMITIES:less then 2 second capillary refill, no joint deformities, effusion, or inflammation, no skin  discoloration, no cyanosis  NEURO: alert & oriented x 3 with fluent speech, no focal motor/sensory deficits, gait normal   LABORATORY DATA: CBC    Component Value Date/Time   WBC 4.7 03/06/2016 0826   RBC 2.72 (L) 03/06/2016 0826   HGB 10.4 (L) 03/06/2016 0826   HCT 30.8 (L) 03/06/2016 0826   PLT 169 03/06/2016 0826   MCV 113.2 (H) 03/06/2016 0826   MCH 38.2 (H) 03/06/2016 0826   MCHC 33.8 03/06/2016 0826   RDW 12.6 03/06/2016 0826   LYMPHSABS 0.9 03/06/2016 0826   MONOABS 0.4 03/06/2016 0826   EOSABS 0.1 03/06/2016 0826   BASOSABS 0.0 03/06/2016 0826      Chemistry      Component Value Date/Time   NA 136 03/06/2016 0826   K 4.0 03/06/2016 0826   CL 106 03/06/2016 0826   CO2 25 03/06/2016 0826   BUN 15 03/06/2016 0826   CREATININE 0.92 03/06/2016 0826      Component Value Date/Time   CALCIUM 8.9 03/06/2016 0826   ALKPHOS 54 03/06/2016 0826   AST 23 03/06/2016 0826   ALT 14 03/06/2016 0826   BILITOT 0.4 03/06/2016 0826        PENDING LABS:   RADIOGRAPHIC STUDIES:  No results found.   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  Initial medical oncology recommendations provided by Dr. Tressie Stalker at [facility].  Dr. Tressie Stalker does dictate that there is a history of anal intercourse in the remote past.  Oncology history  updated.  Xeloda dose has been reduced to 1150 mg BID Sunday PM- Friday AM during XRT.  She reports that the XRT machine was down yesterday and therefore, she will be finishing on Tuesday.  She notes that she will be out of her Xeloda, but a new Rx was printed for her dose adjustment on 02/26/2016.  I will print a new Rx.  She is advised to accept her new Xeloda Rx, even if it is 1450 mg that is delivered, as she can make her 1150 mg from that dose.  She is advised on how to do this.  Labs today: CBC diff, CMET.  I personally reviewed and went over laboratory results with the patient.  The results are noted within this dictation.  She notes that she is on magnesium at home for hypomagnesemia that was treated by Dr. Tressie Stalker.  I will see if we can add a magnesium level to today's already drawn labs.  Weekly labs: CBC diff, CMET while undergoing treatment.  Magnesium is added for 8/10 lab appointment.  She is on Fentanyl 25 mcg with breakthrough pain management.  She notes that her pain is well controlled on current pain regimen.  She denies any skin breakdown at radiation site.  She is educated on a fentanyl taper when discomfort starts to improve from XRT.  Her bowels are moving well and she is using a bowel regimen to maintain soft BMs.  Return as scheduled for follow-up, labs, and Xgeva.  She knows to call with any issues related to her chemotherapy or side effects.  Adenocarcinoma of left breast Stage IV breast cancer ER/PR positive, HER-2/neu negative. BRCA2 POSITIVE.  History of stage IIIc left breast cancer in 2009 treated with neoadjuvant chemotherapy consisting of epirubicin and Cytoxan followed by 4 cycles of docetaxel from 04/16/2008-08/13/2008 with the initiation of Herceptin for 52  weeks at the start of docetaxel. She then underwent a left mastectomy on 08/30/2008 showing persistent disease in the breast and lymph nodes. This was followed by radiation therapy from 09/17/2008-11/26/2008 to  the left chest wall, supraclavicular, and axillary node areas. She then moved on to antiestrogen therapy with letrozole (11/27/2008-04/20/2011). She was found to have recurrent breast cancer on supraclavicular lymph node biopsy on 03/30/2011 leading to a change in antiestrogen therapy to tamoxifen (04/21/2011-06/04/2014). Pet imaging on 05/25/2014 demonstrated progression of left submandibular lymph node, mediastinal lymph node, and probable lung involvement. This was followed by a biopsy on 06/13/2014 of a left supraclavicular mass that did reveal metastatic carcinoma consistent with breast primary. She was then treated with carboplatin and Taxol for 6 cycles with an excellent response (06/25/2014-10/09/2014). She was then transition back to hormone therapy consisting of Ibrance and Faslodex (11/13/2014-06/10/2015). This was complicated by grade 3 neutropenia with cycle #2 resulting in a dose reduction of Ibrance to 100 mg for cycle 3. Unfortunately, on 06/10/2015, she was noted to have progression of disease in the right mid cervical lymph node over a 4 day period that was PET avid. Therapy was therefore changed to exemestane and everolimus (62/94/7654-65/10/5463) that was complicated by an adverse reaction consisting of diffuse pneumonitis. This regimen was therefore discontinued. She then transitioned back to systemic chemotherapy consisting of cisplatin every 14 days (08/23/2015-12/23/2015). She was put on a drug holiday beginning on 12/23/2015 in preparation for a trip to [place] on 01/25/2016.  Her oncology history is up to date.  Currently, she has developed an anal cancer that will require local treatment consisting of chemoradiation. Treatment with Xeloda has been discussed and this can be utilized post local treatment for maintenance for her breast cancer.  She has seen Dr. Mauro Kaufmann Muss in the past for second opinion.  I did briefly discuss CODE STATUS given her incurable disease. At this time, she  wishes to be a FULL CODE.  She is not unrealistic with regards to the incurability of her breast cancer. Performance status is excellent. In the future, we will need to readdress this issue as she declines. Fortunately, from a breast cancer perspective, her options are becoming more limited.  Bone metastasis (HCC) Bone metastases from breast cancer. On Xgeva therapy. She is also taking calcium and vitamin D.  Oncology Flowsheet 02/20/2016  denosumab (XGEVA) [place] 120 mg    ORDERS PLACED FOR THIS ENCOUNTER: Orders Placed This Encounter  Procedures  . Magnesium  . Magnesium    MEDICATIONS PRESCRIBED THIS ENCOUNTER: Meds ordered this encounter  Medications  . vitamin B-12 (CYANOCOBALAMIN) 1000 MCG tablet    Sig: Take 1,000 mcg by mouth daily.  . capecitabine (XELODA) 500 MG tablet    Sig: Take 2 tablets (1,000 mg total) by mouth 2 (two) times daily after a meal. Take 1150 mg PO BID Sunday PM- Friday AM during XRT    Dispense:  60 tablet    Refill:  0    Order Specific Question:   Supervising Provider    Answer:   Patrici Ranks [id number]  . capecitabine (XELODA) 150 MG tablet    Sig: Take 1 tablet (150 mg total) by mouth 2 (two) times daily after a meal. Take 1150 mg PO BID Sunday PM- Friday AM during XRT.    Dispense:  30 tablet    Refill:  0    Order Specific Question:   Supervising Provider    Answer:   Patrici Ranks [id number]  THERAPY PLAN:  Continue Xeloda with XRT as planned.  All questions were answered. The patient knows to call the clinic with any problems, questions or concerns. We can certainly see the patient much sooner if necessary.  Patient and plan discussed with Dr. Ancil Linsey and she is in agreement with the aforementioned.   This note is electronically signed by: Doy Mince 03/07/2016 10:44 PM

## 2016-03-06 NOTE — Assessment & Plan Note (Addendum)
Stage IV breast cancer ER/PR positive, HER-2/neu negative. BRCA2 POSITIVE.  History of stage IIIc left breast cancer in 2009 treated with neoadjuvant chemotherapy consisting of epirubicin and Cytoxan followed by 4 cycles of docetaxel from 04/16/2008-08/13/2008 with the initiation of Herceptin for 52 weeks at the start of docetaxel. She then underwent a left mastectomy on 08/30/2008 showing persistent disease in the breast and lymph nodes. This was followed by radiation therapy from 09/17/2008-11/26/2008 to the left chest wall, supraclavicular, and axillary node areas. She then moved on to antiestrogen therapy with letrozole (11/27/2008-04/20/2011). She was found to have recurrent breast cancer on supraclavicular lymph node biopsy on 03/30/2011 leading to a change in antiestrogen therapy to tamoxifen (04/21/2011-06/04/2014). Pet imaging on 05/25/2014 demonstrated progression of left submandibular lymph node, mediastinal lymph node, and probable lung involvement. This was followed by a biopsy on 06/13/2014 of a left supraclavicular mass that did reveal metastatic carcinoma consistent with breast primary. She was then treated with carboplatin and Taxol for 6 cycles with an excellent response (06/25/2014-10/09/2014). She was then transition back to hormone therapy consisting of Ibrance and Faslodex (11/13/2014-06/10/2015). This was complicated by grade 3 neutropenia with cycle #2 resulting in a dose reduction of Ibrance to 100 mg for cycle 3. Unfortunately, on 06/10/2015, she was noted to have progression of disease in the right mid cervical lymph node over a 4 day period that was PET avid. Therapy was therefore changed to exemestane and everolimus (27/78/2423-53/61/4431) that was complicated by an adverse reaction consisting of diffuse pneumonitis. This regimen was therefore discontinued. She then transitioned back to systemic chemotherapy consisting of cisplatin every 14 days (08/23/2015-12/23/2015). She was put on a  drug holiday beginning on 12/23/2015 in preparation for a trip to [place] on 01/25/2016.  Her oncology history is up to date.  Currently, she has developed an anal cancer that will require local treatment consisting of chemoradiation. Treatment with Xeloda has been discussed and this can be utilized post local treatment for maintenance for her breast cancer.  She has seen Dr. Mauro Kaufmann Muss in the past for second opinion.  I did briefly discuss CODE STATUS given her incurable disease. At this time, she wishes to be a FULL CODE.  She is not unrealistic with regards to the incurability of her breast cancer. Performance status is excellent. In the future, we will need to readdress this issue as she declines. Fortunately, from a breast cancer perspective, her options are becoming more limited.

## 2016-03-08 ENCOUNTER — Encounter (HOSPITAL_COMMUNITY): Payer: Self-pay | Admitting: Hematology & Oncology

## 2016-03-10 ENCOUNTER — Other Ambulatory Visit (HOSPITAL_COMMUNITY): Payer: Self-pay | Admitting: Emergency Medicine

## 2016-03-10 ENCOUNTER — Encounter (HOSPITAL_COMMUNITY): Payer: Self-pay | Admitting: Emergency Medicine

## 2016-03-10 ENCOUNTER — Telehealth (HOSPITAL_COMMUNITY): Payer: Self-pay | Admitting: *Deleted

## 2016-03-10 MED ORDER — CAPECITABINE 500 MG PO TABS: ORAL_TABLET | ORAL | 0 refills | Status: DC

## 2016-03-10 MED ORDER — CAPECITABINE 150 MG PO TABS: ORAL_TABLET | ORAL | 0 refills | Status: DC

## 2016-03-10 NOTE — Progress Notes (Unsigned)
Called pt to let her know that she could take a 7 day break from her xeloda.  Restart taking 1450mg  twice a day for 7 days then 7 day break.  Call if she had any problems.  New prescription printed and given to angie.  Pt verbalized understanding.

## 2016-03-11 ENCOUNTER — Telehealth (HOSPITAL_COMMUNITY): Payer: Self-pay | Admitting: *Deleted

## 2016-03-11 ENCOUNTER — Other Ambulatory Visit (HOSPITAL_COMMUNITY): Payer: BLUE CROSS/BLUE SHIELD

## 2016-03-11 ENCOUNTER — Ambulatory Visit (HOSPITAL_COMMUNITY): Payer: BLUE CROSS/BLUE SHIELD

## 2016-03-11 NOTE — Telephone Encounter (Signed)
Patient given detailed instructions per Myocardial Perfusion Study Information Sheet for the test on 03/16/16  Patient notified to arrive 15 minutes early and that it is imperative to arrive on time for appointment to keep from having the test rescheduled.  If you need to cancel or reschedule your appointment, please call the office within 24 hours of your appointment. Failure to do so may result in a cancellation of your appointment, and a $50 no show fee. Patient verbalized understanding. Marillyn Goren J Dominyk Law, RN  

## 2016-03-13 ENCOUNTER — Ambulatory Visit (HOSPITAL_COMMUNITY): Payer: BLUE CROSS/BLUE SHIELD

## 2016-03-13 ENCOUNTER — Other Ambulatory Visit (HOSPITAL_COMMUNITY): Payer: BLUE CROSS/BLUE SHIELD

## 2016-03-13 ENCOUNTER — Encounter: Payer: Self-pay | Admitting: Cardiology

## 2016-03-16 ENCOUNTER — Ambulatory Visit (HOSPITAL_COMMUNITY): Payer: BLUE CROSS/BLUE SHIELD | Attending: Cardiovascular Disease

## 2016-03-16 DIAGNOSIS — R0609 Other forms of dyspnea: Secondary | ICD-10-CM | POA: Diagnosis not present

## 2016-03-16 DIAGNOSIS — I10 Essential (primary) hypertension: Secondary | ICD-10-CM | POA: Insufficient documentation

## 2016-03-16 DIAGNOSIS — R42 Dizziness and giddiness: Secondary | ICD-10-CM | POA: Diagnosis not present

## 2016-03-16 DIAGNOSIS — R0789 Other chest pain: Secondary | ICD-10-CM | POA: Insufficient documentation

## 2016-03-16 DIAGNOSIS — I2510 Atherosclerotic heart disease of native coronary artery without angina pectoris: Secondary | ICD-10-CM

## 2016-03-16 DIAGNOSIS — R002 Palpitations: Secondary | ICD-10-CM | POA: Diagnosis not present

## 2016-03-16 LAB — MYOCARDIAL PERFUSION IMAGING
LV dias vol: 90 mL (ref 46–106)
LV sys vol: 38 mL
Peak HR: 98 {beats}/min
RATE: 0.31
Rest HR: 51 {beats}/min
SDS: 1
SRS: 3
SSS: 4
TID: 1.13

## 2016-03-16 MED ORDER — TECHNETIUM TC 99M TETROFOSMIN IV KIT
30.8000 | PACK | Freq: Once | INTRAVENOUS | Status: AC | PRN
  Administered 2016-03-16: 30.8 via INTRAVENOUS
  Filled 2016-03-16: qty 31

## 2016-03-16 MED ORDER — TECHNETIUM TC 99M TETROFOSMIN IV KIT
10.2000 | PACK | Freq: Once | INTRAVENOUS | Status: AC | PRN
  Administered 2016-03-16: 10 via INTRAVENOUS
  Filled 2016-03-16: qty 10

## 2016-03-16 MED ORDER — REGADENOSON 0.4 MG/5ML IV SOLN
0.4000 mg | Freq: Once | INTRAVENOUS | Status: AC
  Administered 2016-03-16: 0.4 mg via INTRAVENOUS

## 2016-03-17 ENCOUNTER — Telehealth: Payer: Self-pay | Admitting: Interventional Cardiology

## 2016-03-17 NOTE — Telephone Encounter (Signed)
Returning Calling to get test Results

## 2016-03-17 NOTE — Telephone Encounter (Signed)
Pt aware of her stress test results.  Verbalized understanding.

## 2016-03-19 ENCOUNTER — Encounter (HOSPITAL_COMMUNITY): Payer: BLUE CROSS/BLUE SHIELD

## 2016-03-19 ENCOUNTER — Encounter (HOSPITAL_COMMUNITY): Payer: Self-pay | Admitting: Adult Health

## 2016-03-19 ENCOUNTER — Other Ambulatory Visit (HOSPITAL_COMMUNITY): Payer: Self-pay | Admitting: Oncology

## 2016-03-19 ENCOUNTER — Encounter (HOSPITAL_COMMUNITY): Payer: BLUE CROSS/BLUE SHIELD | Attending: Adult Health | Admitting: Adult Health

## 2016-03-19 ENCOUNTER — Encounter (HOSPITAL_BASED_OUTPATIENT_CLINIC_OR_DEPARTMENT_OTHER): Payer: BLUE CROSS/BLUE SHIELD

## 2016-03-19 VITALS — BP 120/56 | HR 63 | Temp 98.9°F | Resp 20 | Wt 140.0 lb

## 2016-03-19 DIAGNOSIS — C7951 Secondary malignant neoplasm of bone: Secondary | ICD-10-CM

## 2016-03-19 DIAGNOSIS — C50912 Malignant neoplasm of unspecified site of left female breast: Secondary | ICD-10-CM

## 2016-03-19 DIAGNOSIS — C210 Malignant neoplasm of anus, unspecified: Secondary | ICD-10-CM | POA: Diagnosis not present

## 2016-03-19 DIAGNOSIS — K219 Gastro-esophageal reflux disease without esophagitis: Secondary | ICD-10-CM

## 2016-03-19 LAB — COMPREHENSIVE METABOLIC PANEL
ALT: 18 U/L (ref 14–54)
AST: 25 U/L (ref 15–41)
Albumin: 3.8 g/dL (ref 3.5–5.0)
Alkaline Phosphatase: 47 U/L (ref 38–126)
Anion gap: 5 (ref 5–15)
BUN: 17 mg/dL (ref 6–20)
CO2: 27 mmol/L (ref 22–32)
Calcium: 8.7 mg/dL — ABNORMAL LOW (ref 8.9–10.3)
Chloride: 105 mmol/L (ref 101–111)
Creatinine, Ser: 0.99 mg/dL (ref 0.44–1.00)
GFR calc Af Amer: >60 mL/min (ref >60)
GFR calc non Af Amer: 59 mL/min — ABNORMAL LOW (ref >60)
Glucose, Bld: 94 mg/dL (ref 65–99)
Potassium: 4 mmol/L (ref 3.5–5.1)
Sodium: 137 mmol/L (ref 135–145)
Total Bilirubin: 0.4 mg/dL (ref 0.3–1.2)
Total Protein: 6.4 g/dL — ABNORMAL LOW (ref 6.5–8.1)

## 2016-03-19 LAB — CBC WITH DIFFERENTIAL/PLATELET
Basophils Absolute: 0 10*3/uL (ref 0.0–0.1)
Basophils Relative: 0 %
Eosinophils Absolute: 0 10*3/uL (ref 0.0–0.7)
Eosinophils Relative: 1 %
HCT: 32.7 % — ABNORMAL LOW (ref 36.0–46.0)
Hemoglobin: 10.9 g/dL — ABNORMAL LOW (ref 12.0–15.0)
Lymphocytes Relative: 21 %
Lymphs Abs: 0.9 10*3/uL (ref 0.7–4.0)
MCH: 37.2 pg — ABNORMAL HIGH (ref 26.0–34.0)
MCHC: 33.3 g/dL (ref 30.0–36.0)
MCV: 111.6 fL — ABNORMAL HIGH (ref 78.0–100.0)
Monocytes Absolute: 0.4 10*3/uL (ref 0.1–1.0)
Monocytes Relative: 9 %
Neutro Abs: 2.8 10*3/uL (ref 1.7–7.7)
Neutrophils Relative %: 69 %
Platelets: 161 10*3/uL (ref 150–400)
RBC: 2.93 MIL/uL — ABNORMAL LOW (ref 3.87–5.11)
RDW: 12.8 % (ref 11.5–15.5)
WBC: 4 10*3/uL (ref 4.0–10.5)

## 2016-03-19 LAB — MAGNESIUM: Magnesium: 1.8 mg/dL (ref 1.7–2.4)

## 2016-03-19 MED ORDER — OMEPRAZOLE 40 MG PO CPDR: 40.0000 mg | DELAYED_RELEASE_CAPSULE | Freq: Two times a day (BID) | ORAL | 4 refills | Status: DC

## 2016-03-19 MED ORDER — CAPECITABINE 150 MG PO TABS: ORAL_TABLET | ORAL | 0 refills | Status: DC

## 2016-03-19 MED ORDER — CAPECITABINE 500 MG PO TABS: ORAL_TABLET | ORAL | 0 refills | Status: DC

## 2016-03-19 MED ORDER — DENOSUMAB 120 MG/1.7ML ~~LOC~~ SOLN
120.0000 mg | Freq: Once | SUBCUTANEOUS | Status: AC
  Administered 2016-03-19: 120 mg via SUBCUTANEOUS
  Filled 2016-03-19: qty 1.7

## 2016-03-19 NOTE — Progress Notes (Signed)
[name] presents today for injection per MD orders. X-Geva 120mg  administered SQ in right Abdomen. Administration without incident. Patient tolerated well.

## 2016-03-19 NOTE — Progress Notes (Signed)
[facility] Hematology/Oncology Progress Note   Name: [name]      MRN: [id number]    Date: 03/19/2016 Time:9:40 AM   REFERRING PHYSICIAN:  Everardo All, MD (Med Oncology at [facility])  REASON FOR VISIT:  Follow-up for Stage IV breast cancer and recent diagnosis of squamous cell carcinoma of the anus.    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 female with a medical history significant for Stage IV ER/PR+ breast cancer with initial diagnosis showing HER2 POSITIVITY initially diagnosed with Stage IIIC disease in 2012 and treated curative intent with metastatic disease discovered in October 2015 and osseous involvement noted on PET imaging on 09/10/2014, who is referred to the [facility] for transfer of care with Stage IV breast cancer and newly diagnosed squamous cell carcinoma of anus.     Adenocarcinoma of left breast   04/09/2008 Pathology Results    Positive for breast cancer, ER 100%, PR 5%, HER-2/neu POSITIVE.     04/09/2008 Procedure    Biopsy of left axillary node mass      04/16/2008 - 08/13/2008 Chemotherapy    Dose dense epirubicin/Cytoxan 4 cycles followed by docetaxel 4 cycles. Herceptin initiated with docetaxel for a total of 52 weeks.     08/30/2008 Definitive Surgery    Left mastectomy revealing persistence of disease in the breast and lymph nodes.     09/17/2008 - 11/26/2008 Radiation Therapy    Radiation to left chest wall, supraclavicular, and axillary nodal areas.     11/27/2008 - 04/20/2011 Anti-estrogen oral therapy    Letrozole     03/30/2011 Procedure    Left supraclavicular lymph node biopsy     03/30/2011 Pathology Results    Recurrent breast cancer, invasive ductal type, ER 100%, PR  40%, HER-2 NEGATIVE     04/21/2011 - 06/04/2014 Anti-estrogen oral therapy    Tamoxifen     05/25/2014 PET scan    Progression in left submandibular lymph node, mediastinal lymph nodes, and probable lung involvement.     05/25/2014 Progression    PET scan demonstrates progression of disease.     06/13/2014 Procedure    Left supraclavicular mass biopsy     06/13/2014 Pathology Results    Metastatic carcinoma consistent with breast primary, involving skeletal muscle, perineural invasion, LVI, no lymphoid tissue identified, ER 98%, PR negative, HER-2 negative     06/25/2014 - 10/09/2014 Chemotherapy    Carboplatin/Taxol 6 cycles with excellent response     11/06/2014 - 06/10/2015 Chemotherapy    Palbociclib and Faslodex     01/08/2015 Adverse Reaction    Grade 3 neutropenia with cycle #2 of Palbociclib.     01/08/2015 Treatment Plan Change    Palbociclib dose reduced to 100 mg for cycle #3.     06/10/2015 Progression    Right cervical lymph node progression clinically developed over 4 days.  This lymph node was positive on PET scan in September 2016 and at that time was approximately 8 mm with possible progression in a single bone metastasis.     06/17/2015 Procedure    Biopsy of right cervical lymph node     06/17/2015 Pathology Results    Genetic sequencing performed revealing BRCA2 positivity  07/02/2015 - 08/01/2015 Chemotherapy    Exemestane/everolimus, 25 mg/ 5 mg x 7 days then increasing to 10 mg daily.     08/01/2015 PET scan    Diffuse pneumonitis.       08/01/2015 Adverse Reaction    Pneumonitis secondary to drug reaction from everolimus.     08/23/2015 - 12/23/2015 Chemotherapy    Cisplatin 40 mg/m every other week     11/15/2015 PET scan    Interval resolution of hypermetabolic airspace opacities in the lungs which were likely inflammatory. Abnormal high activity at the anal rectal junction. Very low grade residual activity at the site of L3 metastatic lesion.      12/23/2015 Treatment Plan Change    Holiday from treatment in preparation for trip to [place].      Squamous cell carcinoma of anus ([name])   12/31/2015 Procedure    Colonoscopy by Dr. Britta Mccreedy     12/31/2015 Pathology Results    Invasive squamous cell carcinoma approximate 4 cm mass posterior wall of rectum     01/13/2016 Treatment Plan Change    Dr. Leighton Ruff consultation- no surgery.  Recommend chemoXRT     01/16/2016 Pathology Results    HPV POSITIVE     02/07/2016 -  Chemotherapy    Xeloda 1450 mg PO BID Sunday PM- Friday AM during XRT     02/07/2016 -  Radiation Therapy    8/1 is anticipated completion date.     02/19/2016 Treatment Plan Change    Xeloda on hold due to increased GERD symtpoms.  PPI dose increased.  Will restart Xeloda tomorrow, 02/21/2016.     02/23/2016 Treatment Plan Change    Xeloda dose decreased to 1150 mg PO BID Sunday PM- Friday AM during XRT      Chart is reviewed. Oncology history is developed. Staging is completed. All dictations are reviewed.   Newly diagnosed squamous cell rectal cancer has been evaluated by a radiation therapy standpoint. He saw Dr. Lisbeth Renshaw on 01/10/2016. I have his note available and this is reviewed:  One of Dr. Jaclyn Prime primary concern is that of attempting to spare the patient's bone marrow reserve is to maintain the ability to tolerate chemotherapy given that the dominant life threatening issue remains her metastatic breast cancer. However, the patient is symptomatic from her rectal cancer and this also needs to be addressed with local therapy. The patient is seeing Dr. Marcello Moores to discuss possible surgery. If this is felt to be warranted as initial treatment I believe this would be reasonable and I would not street feel strongly about preceding this with radiation treatment or chemoradiation treatment. However, if the patient will not undergo surgery after this discussion, and I would recommend palliative radiation treatment to the  pelvis. This could be done in a focal manner to a greater extent than normal for rectal cancer and sparing of the bone marrow could be achieved to a much greater extent than a traditional rectal cancer plan. The patient's treatment could consist of traditional 5-1/2 week course of treatment or a shorter course of treatment such as a two-week course. One additional alternative would involve 5 Gy5 fraction as is often more commonly done in [place], which also may be a good short course option for the patient. With the diagnosis of squamous cell cancer, the patient may have a very radiosensitive tumor if she does require palliative radiation therapy.  All of this was discussed with the patient. We will follow up on her discussion with  Dr. Marcello Moores on Monday and then proceed accordingly. The patient could undergo simulation in a week on Thursday if this appears appropriate. All the patient's questions were answered today and she states that she is comfortable with this plan.  [name] returns to the [facility] today unaccompanied. She continues to wear her lymphedema sleeve on her left arm.  She notes that she's having bleeding with her BM. She notes that, every now and then, she'll feel like she needs to go to the bathroom, but all it is is blood. She will then wipe with 3 tissues to make sure she's clean.  Her pain ranges from a 4 to a 6. She says it's enough right now that she can't do something else and forget about it. Ibuprofen was working well, but when she started on the Xeloda, she stopped taking her ibuprofen. She was doing 600 mg. She notes that she doesn't want to try opioids again because it just makes her want to sleep. She did hydrocodone before she went to bed; it took an hour to come into effect, and she fell asleep. Then she became constipated, which she doesn't want to do because it causes more pain and more problems. She took tramadol, which helped her but she also became constipated.    Her main concern is constipation on the opioids, and she knows not to take ibuprofen if she's bleeding.   She notes that she's got plenty of hydrocodone, has miralax. She does not have tramadol. She is open to trying the fentanyl patch for pain control.  She's concerned about her CT scan showing advanced atherosclerosis, and was advised that she needs to wait until she's done with all of her treatment to work with the heart doctor. She is already scheduled for a stress test once she's done with radiation, and knows to talk to the doctor about how she's feeling before she does it.  She notes no mouth sores, that her hands and feet don't feel sore, and notes no bad heartburn. Overall, she feels fine. In terms of her bowels, she notes nothing extremely different, at least as far as starting her Xeloda treatment. During the physical exam, when her hands are examined, she notes "I swear I can't tell I'm taking it."   INTERVAL HISTORY: 03/19/16  [name] returns to the [facility] today unaccompanied. Overall she seems to be doing well, mostly concerned about her pain management on the fentanyl.  She wants to talk about decreasing the dose of the Fentanyl patch.   She notes that she's been out of radiation for about 9 days, and wants to know how to wean off of fentanyl. She says her motto is still "this ass is on fire," but "it's getting much better." She says that her burning with urination is getting better as well. She denies any blood in the urine or the stool.   She confirms that she is currently on the Fentanyl 25 mcg patch, and was doing tramadol once a day for breakthrough "tumor pain." She notes she has not required any Tramadol for the past 3-4 days, saying that all she has left as it relates to her pain is the burning.   She notes that she has a bowel movement 3 or 4 times a day.  She just started back on the Xeloda yesterday. She notes that she has enough for 14 days of medication.  She is on day 2 now, and has enough for this week, skip a week,  and the next week of treatment.  She denies nausea or vomiting, denies hand rashes or rashes on her feet, denies any skin changes or other suspicious changes. She notes that her energy is alright right now, "I don't know why." She says she was having real trouble with energy a few weeks back, but the fentanyl helped the fatigue to resolve.  She notes that her appetite is "not that well right now, but that's fine with me." She says it's at about 75%. She says she's still eating, having plenty of fluids, and that she was eating so much crap during chemo that she is happy to have lost one pound recently. She notes that she's going back to her regular diet and is happy about this.  In terms of physical activity, she says she rides horses 6-7 days a week; she was even able to do this during radiation, which she loves.    She denies headaches and dizziness, and denies any new bone pressure. She does note that sometimes she stands up and experiences a little dizziness, but nothing "bottoming out." She says she doesn't get off balance at all when she goes from sitting to standing, or while walking, or while on horseback.  She wonders if she can have a margarita every once in a while when she goes on an upcoming visit [place]. She's going on a week long beach trip with her two sisters and wants to have an occasional margarita. She was advised that having an occasional alcoholic beverage would be fine, but she should try to plan to do that on her "off week" of chemotherapy, rather than why she is taking the Xeloda.    She hasn't had anyone "look at my rear end" since 3 days before her radiation ended. She agrees to have her bottom examined today. She is healing really well.   Review of Systems  Constitutional: Negative.  Negative for chills, fever and weight loss.  HENT: Negative.  Negative for hearing loss and tinnitus.   Eyes: Negative.   Negative for blurred vision and double vision.  Respiratory: Negative.  Negative for cough, hemoptysis, sputum production, shortness of breath and wheezing.   Cardiovascular: Negative.  Negative for chest pain.  Gastrointestinal: Negative for abdominal pain, blood in stool, constipation, diarrhea (Loose stools, multiple times per day.), melena, nausea and vomiting.       Diarrhea is unchanged  Genitourinary: Negative.  Negative for dysuria, frequency, hematuria and urgency.  Musculoskeletal: Negative.  Negative for back pain, falls, joint pain and myalgias.  Skin: Negative.   Neurological: Negative.  Negative for dizziness, sensory change, speech change, focal weakness, seizures, loss of consciousness, weakness and headaches.  Endo/Heme/Allergies: Negative.   Psychiatric/Behavioral: Negative.   14 point review of systems was performed and is negative except as detailed under history of present illness and above    PAST MEDICAL HISTORY:   Past Medical History:  Diagnosis Date  . Adenocarcinoma of breast ([place]) 01/27/2011   stage IIIc breast cancer  . Bladder infection   . Bone metastasis ([place]) 01/17/2016  . Borderline hypertension   . Breast cancer ([place])   . Closed fracture of humerus sept 2013  . Fatigue   . Generalized headaches   . Osteoporosis 01/27/2011  . Pneumonia   . Port catheter in place 04/14/2013  . Post-mastectomy lymphedema syndrome   . 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 Reflux   . Retinal detachment   . 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 Swollen lymph nodes   . URI (upper respiratory infection) 02/17/12  . Wears glasses     ALLERGIES: Allergies  Allergen Reactions  . Ciprofloxacin Other (See Comments)    tendonitis      MEDICATIONS: I have reviewed the patient's current medications.    Current Outpatient Prescriptions on File Prior to Visit  Medication Sig Dispense Refill  . atorvastatin (LIPITOR) 10  MG tablet Take 1 tablet (10 mg total) by mouth daily. 30 tablet 3  . calcium-vitamin D (OSCAL WITH D) 500-200 MG-UNIT per tablet Take 2 tablets by mouth daily.     . capecitabine (XELODA) 150 MG tablet Take 1450 mg twice a day 7 days on and 7 days off. 84 tablet 0  . capecitabine (XELODA) 500 MG tablet Take 1450 mg twice a day 7 days on and 7 days off 56 tablet 0  . diphenhydrAMINE (SOMINEX) 25 MG tablet Take 25 mg by mouth at bedtime as needed. To help with sleeping    . fentaNYL (DURAGESIC - DOSED MCG/HR) 25 MCG/HR patch Place 1 patch (25 mcg total) onto the skin every 3 (three) days. 10 patch 0  . HYDROcodone-acetaminophen (NORCO) 10-325 MG tablet Take 1 tablet by mouth every 6 (six) hours as needed. 10 tablet 0  . magnesium oxide (MAG-OX) 400 MG tablet Take 400 mg by mouth 2 (two) times daily.    . metoprolol tartrate (LOPRESSOR) 25 MG tablet Take 25 mg by mouth daily.    . nitrofurantoin (MACRODANTIN) 100 MG capsule Take 100 mg by mouth 2 (two) times daily.    Marland Kitchen omeprazole (PRILOSEC) 20 MG capsule Take 40 mg by mouth 2 (two) times daily before a meal.     . ondansetron (ZOFRAN) 8 MG tablet Take 8 mg by mouth every 8 (eight) hours as needed for nausea or vomiting.    . traMADol (ULTRAM) 50 MG tablet One to two tablets by mouth every 6 hours as needed for pain 90 tablet 0  . vitamin B-12 (CYANOCOBALAMIN) 1000 MCG tablet Take 1,000 mcg by mouth daily.     No current facility-administered medications on file prior to visit.      PAST SURGICAL HISTORY Past Surgical History:  Procedure Laterality Date  . biopsy of lymph node     super clavicle  . BREAST SURGERY    . BTL    . CATARACT EXTRACTION    . cervical disc fusioni    . LYMPH NODE BIOPSY Right 06/19/2015   Procedure: RIGHT NECK LYMPH NODE EXCISION;  Surgeon: Rolm Bookbinder, MD;  Location: [place];  Service: General;  Laterality: Right;  . MASTECTOMY MODIFIED RADICAL    . PORT-A-CATH REMOVAL     in place (not  removed)  . retinal detachment and repair      FAMILY HISTORY: Family History  Problem Relation Age of Onset  . Hypertension Mother   . Heart disease Mother     atrial fib  . Heart disease Father   . Other Father     glaucoma    SOCIAL HISTORY:  reports that she has never smoked. She has never used smokeless tobacco. She reports that she does not drink alcohol or use drugs.  Social History   Social History  . Marital status: Divorced    Spouse name: N/A  . Number of children: N/A  . Years of education: N/A   Social History Main Topics  . Smoking status:  Never Smoker  . Smokeless tobacco: Never Used  . Alcohol use No  . Drug use: No  . Sexual activity: Not Asked   Other Topics Concern  . None   Social History Narrative  . None    PERFORMANCE STATUS: The patient's performance status is 1 - Symptomatic but completely ambulatory  PHYSICAL EXAM: Most Recent Vital Signs: Blood pressure (!) 120/56, pulse 63, temperature 98.9 F (37.2 C), temperature source Oral, resp. rate 20, weight 140 lb (63.5 kg), SpO2 100 %. General appearance: Alert, cooperative, appears stated age, no distress and smiling and unaccompanied. Wears glasses. Head: Normocephalic, without obvious abnormality, atraumatic Eyes: negative findings: lids and lashes normal, conjunctivae and sclerae normal and corneas clear Throat: normal findings: oropharynx pink & moist without lesions or evidence of thrush Neck: no adenopathy, supple, symmetrical, trachea midline and thyroid not enlarged, symmetric, no tenderness/mass/nodules Lungs: clear to auscultation bilaterally Heart: regular rate and rhythm, S1, S2 normal, no murmur, click, rub or gallop Abdomen: soft, non-tender; bowel sounds normal; no masses,  no organomegaly GU/Anus: Mild redness to anus, but progressing towards healing.  No apparent moist desquamation to skin.  Extremities: extremities normal, atraumatic, no cyanosis or edema. Skin: Skin  color, texture, turgor normal. No rashes or lesions Lymph nodes: No cervical, supraclavicular, or infraclavicular lymphadenopathy. Neurologic: No focal deficits. Steady gait.   LABORATORY DATA:  Results for orders placed or performed in visit on 03/19/16 (from the past 48 hour(s))  CBC with Differential     Status: Abnormal   Collection Time: 03/19/16  8:28 AM  Result Value Ref Range   WBC 4.0 4.0 - 10.5 K/uL   RBC 2.93 (L) 3.87 - 5.11 MIL/uL   Hemoglobin 10.9 (L) 12.0 - 15.0 g/dL   HCT 32.7 (L) 36.0 - 46.0 %   MCV 111.6 (H) 78.0 - 100.0 fL   MCH 37.2 (H) 26.0 - 34.0 pg   MCHC 33.3 30.0 - 36.0 g/dL   RDW 12.8 11.5 - 15.5 %   Platelets 161 150 - 400 K/uL   Neutrophils Relative % 69 %   Neutro Abs 2.8 1.7 - 7.7 K/uL   Lymphocytes Relative 21 %   Lymphs Abs 0.9 0.7 - 4.0 K/uL   Monocytes Relative 9 %   Monocytes Absolute 0.4 0.1 - 1.0 K/uL   Eosinophils Relative 1 %   Eosinophils Absolute 0.0 0.0 - 0.7 K/uL   Basophils Relative 0 %   Basophils Absolute 0.0 0.0 - 0.1 K/uL  Comprehensive metabolic panel     Status: Abnormal   Collection Time: 03/19/16  8:28 AM  Result Value Ref Range   Sodium 137 135 - 145 mmol/L   Potassium 4.0 3.5 - 5.1 mmol/L   Chloride 105 101 - 111 mmol/L   CO2 27 22 - 32 mmol/L   Glucose, Bld 94 65 - 99 mg/dL   BUN 17 6 - 20 mg/dL   Creatinine, Ser 0.99 0.44 - 1.00 mg/dL   Calcium 8.7 (L) 8.9 - 10.3 mg/dL   Total Protein 6.4 (L) 6.5 - 8.1 g/dL   Albumin 3.8 3.5 - 5.0 g/dL   AST 25 15 - 41 U/L   ALT 18 14 - 54 U/L   Alkaline Phosphatase 47 38 - 126 U/L   Total Bilirubin 0.4 0.3 - 1.2 mg/dL   GFR calc non Af Amer 59 (L) >60 mL/min   GFR calc Af Amer >60 >60 mL/min    Comment: (NOTE) The eGFR has been calculated using the  CKD EPI equation. This calculation has not been validated in all clinical situations. eGFR's persistently <60 mL/min signify possible Chronic Kidney Disease.    Anion gap 5 5 - 15     *Labs reviewed and are grossly stable.           Results for MAKELLA, BUCKINGHAM (MRN [id number]) as of 02/14/2016 13:45  Ref. Range 02/14/2016 08:56  Sodium Latest Ref Range: 135-145 mmol/L 135  Potassium Latest Ref Range: 3.5-5.1 mmol/L 3.9  Chloride Latest Ref Range: 101-111 mmol/L 104  CO2 Latest Ref Range: 22-32 mmol/L 26  BUN Latest Ref Range: 6-20 mg/dL 17  Creatinine Latest Ref Range: 0.44-1.00 mg/dL 0.99  Calcium Latest Ref Range: 8.9-10.3 mg/dL 9.1  EGFR (Non-African Amer.) Latest Ref Range: >60 mL/min 59 (L)  EGFR (African American) Latest Ref Range: >60 mL/min >60  Glucose Latest Ref Range: 65-99 mg/dL 132 (H)  Anion gap Latest Ref Range: 5-15  5  Alkaline Phosphatase Latest Ref Range: 38-126 U/L 55  Albumin Latest Ref Range: 3.5-5.0 g/dL 3.9  AST Latest Ref Range: 15-41 U/L 27  ALT Latest Ref Range: 14-54 U/L 23  Total Protein Latest Ref Range: 6.5-8.1 g/dL 6.5  Total Bilirubin Latest Ref Range: 0.3-1.2 mg/dL 0.6  WBC Latest Ref Range: 4.0-10.5 K/uL 5.2  RBC Latest Ref Range: 3.87-5.11 MIL/uL 2.72 (L)  Hemoglobin Latest Ref Range: 12.0-15.0 g/dL 10.5 (L)  HCT Latest Ref Range: 36.0-46.0 % 31.2 (L)  MCV Latest Ref Range: 78.0-100.0 fL 114.7 (H)  MCH Latest Ref Range: 26.0-34.0 pg 38.6 (H)  MCHC Latest Ref Range: 30.0-36.0 g/dL 33.7  RDW Latest Ref Range: 11.5-15.5 % 12.4  Platelets Latest Ref Range: 150-400 K/uL 230  Neutrophils Latest Units: % 72  Lymphocytes Latest Units: % 20  Monocytes Relative Latest Units: % 7  Eosinophil Latest Units: % 0  Basophil Latest Units: % 0  NEUT# Latest Ref Range: 1.7-7.7 K/uL 3.7  Lymphocyte # Latest Ref Range: 0.7-4.0 K/uL 1.1  Monocyte # Latest Ref Range: 0.1-1.0 K/uL 0.4  Eosinophils Absolute Latest Ref Range: 0.0-0.7 K/uL 0.0  Basophils Absolute Latest Ref Range: 0.0-0.1 K/uL 0.0    RADIOGRAPHY:   CLINICAL DATA: New diagnosis of anal cancer. Rectal pain. Left breast cancer 8 years ago with mastectomy. Status post chemotherapy and radiation therapy. History of  bone metastasis.  EXAM: CT CHEST, ABDOMEN, AND PELVIS WITH CONTRAST  TECHNIQUE: Multidetector CT imaging of the chest, abdomen and pelvis was performed following the standard protocol during bolus administration of intravenous contrast.  CONTRAST: 159m ISOVUE-300 IOPAMIDOL (ISOVUE-300) INJECTION 61%  COMPARISON: PET 11/15/2015. Pelvic CT of 01/16/2016.  FINDINGS: CT CHEST FINDINGS  Mediastinum/Lymph Nodes: No supraclavicular adenopathy. A right Port-A-Cath which terminates at the low SVC. No axillary adenopathy. Left mastectomy and axillary node dissection. Mild cardiomegaly. No pericardial effusion. Lad coronary artery atherosclerosis. No central pulmonary embolism, on this non-dedicated study. No mediastinal or hilar adenopathy.  Dilated lower esophagus with fluid level within on image 45/series 2.  Lungs/Pleura: No pleural fluid. 5 mm right upper lobe nodular density on image 40/series 7 is similar to on the prior PET.  Anterior left upper lobe radiation fibrosis and pleural thickening.  Musculoskeletal: Remote lower bilateral rib trauma.  CT ABDOMEN PELVIS FINDINGS  Hepatobiliary: Normal liver. Normal gallbladder, without biliary ductal dilatation.  Pancreas: Normal, without mass or ductal dilatation.  Spleen: Normal in size, without focal abnormality.  Adrenals/Urinary Tract: Normal adrenal glands. Normal kidneys, without hydronephrosis. Normal urinary bladder.  Stomach/Bowel: Normal stomach, without wall  thickening. No dominant anal rectal mass identified. Minimal motion degradation in the upper pelvis. Otherwise normal colon, terminal ileum, and appendix. Normal small bowel.  Vascular/Lymphatic: Aortic and branch vessel atherosclerosis. No retroperitoneal or retrocrural adenopathy. No pelvic sidewall adenopathy. No perianal or perirectal adenopathy.  Reproductive: Normal uterus and adnexa. No adnexal mass.  Other: No significant  free fluid. No evidence of omental or peritoneal disease.  Musculoskeletal: Right sided L1 vertebral body lesion measures 1.7 cm and is not significantly changed. Anterior L3 sclerotic lesion is similar, 2.8 cm.  IMPRESSION: 1. No anorectal primary identified. No evidence of soft tissue metastasis. 2. Left mastectomy and axillary node dissection. Similar sclerotic osseous lesions. 3. Esophageal air fluid level suggests dysmotility or gastroesophageal reflux. 4. Age advanced coronary artery atherosclerosis. Recommend assessment of coronary risk factors and consideration of medical therapy.   Electronically Signed  By: Abigail Miyamoto M.D.  On: 01/22/2016 13:34     PATHOLOGY:  N/A   ASSESSMENT/PLAN:  Excellent PS Squamous cell carcinoma of anus ([place])  Formal opinion from GI tumor board was obtained and recommendations for XELODA with concurrent XRT were noted. She is tolerating XELODA.  I reviewed side effects of concern including worsening baseline diarrhea, nausea, vomiting, abdominal pain, mouth sores, sore red or tender hands or feet. She knows to stop the drug and call with any of these symptoms.   We discussed her rectal bleeding and if it worsens she knows to call or to present to the ED. She notes that it only occurs with her BM's.   Weekly labs will be scheduled with weekly follow-up.    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 BRCA2 POSITIVE.  History of stage IIIc left breast cancer in 2009 treated with neoadjuvant chemotherapy consisting of epirubicin and Cytoxan followed by 4 cycles of docetaxel from 04/16/2008-08/13/2008 with the initiation of Herceptin for 52 weeks at the start of docetaxel. She then underwent a left mastectomy on 08/30/2008 showing persistent disease in the breast and lymph nodes. This was followed by radiation therapy from 09/17/2008-11/26/2008 to the left chest wall,  supraclavicular, and axillary node areas. She then moved on to antiestrogen therapy with letrozole (11/27/2008-04/20/2011). She was found to have recurrent breast cancer on supraclavicular lymph node biopsy on 03/30/2011 leading to a change in antiestrogen therapy to tamoxifen (04/21/2011-06/04/2014). Pet imaging on 05/25/2014 demonstrated progression of left submandibular lymph node, mediastinal lymph node, and probable lung involvement. This was followed by a biopsy on 06/13/2014 of a left supraclavicular mass that did reveal metastatic carcinoma consistent with breast primary. She was then treated with carboplatin and Taxol for 6 cycles with an excellent response (06/25/2014-10/09/2014). She was then transition back to hormone therapy consisting of Ibrance and Faslodex (11/13/2014-06/10/2015). This was complicated by grade 3 neutropenia with cycle #2 resulting in a dose reduction of Ibrance to 100 mg for cycle 3. Unfortunately, on 06/10/2015, she was noted to have progression of disease in the right mid cervical lymph node over a 4 day period that was PET avid. Therapy was therefore changed to exemestane and everolimus (78/67/5449-20/05/711) that was complicated by an adverse reaction consisting of diffuse pneumonitis. This regimen was therefore discontinued. She then transitioned back to systemic chemotherapy consisting of cisplatin every 14 days (08/23/2015-12/23/2015). She was put on a drug holiday beginning on 12/23/2015 in preparation for a trip to [place] on 01/25/2016.  She will continue XELODA after completion of XRT for her STAGE IV breast cancer.  Bone metastasis (HCC) Bone metastases from breast cancer. On Xgeva therapy. She is also taking calcium and vitamin D.  Supportive therapy plan is built.  Next Xgeva injection is due on 02/12/2016.  TREATMENT related PAIN/Opioid induced constipation  We may try an opioid rotation, low-dose fentanyl patch, with the addition of miralax to keep  her stools soft and control her constipation.  I will write her for a 64mg  fentanyl patch, and also write her for tramadol for breakthrough. She will slowly titrate up her fentanyl patch dose. I will write out instructions on how to take her fentanyl, and she knows to call in if she feels she cannot tolerate it.  She also needs lipitor.  She will remain on weekly labs to monitor her hemoglobin.  The patient also called yesterday and told u[place]oreashe was due for a port flush today. She is okay with waiting to do her port flush next week, and have labs drawn at that time.  ASSESSMENT & PLAN (03/19/16):  -Her labs look good; she will continue current treatment.  -Receive Xgeva today, as planned.  -Continue maintenance Xeloda.  -We discussed not decreasing her Fentanyl patch dose quite yet, as she has only completed radiation a little over 1 week ago.  I am concerned if we decrease the Fentanyl patch now, she will have more severe pain while she is continuing to heal.  I explained this rationale to the patient and she agreed to continue the current dose of Fentanyl 25 mcg for now.  She can continue using the tramadol if she needs it.  I think it may be reasonable to begin weaning down on opiates at her next visit to the cancer center, depending on how she is feeling.  -Patient needs refill of Xeloda; we will give the information to [name] to help get this refilled.  -She asked for a prescription for Prilosec 40 mg BID to be sent to her pharmacy, so she would not have to pay out of pocket for the OTC medication.  I e-prescribed this for her to her [facility] in [address]   -She will follow-up in 1 month to see Dr. PWhitney Muse    No orders of the defined types were placed in this encounter.  All questions were answered. The patient knows to call the clinic with any problems, questions or concerns.  This document serves as a record of services personally performed by GMike Craze NP. It was created on  her behalf by KToni Amend a trained medical scribe. The creation of this record is based on the scribe's personal observations and the provider's statements to them. This document has been checked and approved by the attending provider.  I have reviewed the above documentation for accuracy and completeness and I agree with the above.  This note is electronically signed by:  GMike Craze NP  03/19/2016 9:40 AM

## 2016-03-19 NOTE — Progress Notes (Signed)
X-Geva ordered today. Corrected Calcium 8.7. Orders given by Mike Craze NP to go ahead and administer.

## 2016-03-19 NOTE — Patient Instructions (Signed)
[place] at [facility] Discharge Instructions  RECOMMENDATIONS MADE BY THE CONSULTANT AND ANY TEST RESULTS WILL BE SENT TO YOUR REFERRING PHYSICIAN.  X-geva given today per orders. Follow up as scheduled  Thank you for choosing [place] at [facility] to provide your oncology and hematology care.  To afford each patient quality time with our provider, please arrive at least 15 minutes before your scheduled appointment time.   Beginning January 23rd 2017 lab work for the [facility] will be done in the  Main lab at [facility] on 1st floor. If you have a lab appointment with the [facility] please come in thru the  Main Entrance and check in at the main information desk  You need to re-schedule your appointment should you arrive 10 or more minutes late.  We strive to give you quality time with our providers, and arriving late affects you and other patients whose appointments are after yours.  Also, if you no show three or more times for appointments you may be dismissed from the clinic at the providers discretion.     Again, thank you for choosing [facility].  Our hope is that these requests will decrease the amount of time that you wait before being seen by our physicians.       _____________________________________________________________  Should you have questions after your visit to [facility], please contact our office at [phone] between the hours of 8:30 a.m. and 4:30 p.m.  Voicemails left after 4:30 p.m. will not be returned until the following business day.  For prescription refill requests, have your pharmacy contact our office.         Resources For Cancer Patients and their Caregivers ? American Cancer Society: Can assist with transportation, wigs, general needs, runs Look Good Feel Better.        618 609 6992 ? Cancer Care: Provides financial assistance, online support groups,  medication/co-pay assistance.  1-800-813-HOPE (782)191-2351) ? [employer] Assists [place] cancer patients and their families through emotional , educational and financial support.  (806) 860-2731 ? [place] DSS Where to apply for food stamps, Medicaid and utility assistance. 4806096929 ? [employer]: Transportation to medical appointments. 863-662-7500 ? Social Security Administration: May apply for disability if have a Stage IV cancer. 807-076-8332 901-180-8830 ? [employer], Disability and Transit Services: Assists with nutrition, care and transit needs. [place] Support Programs: @10RELATIVEDAYS @ > Cancer Support Group  2nd Tuesday of the month 1pm-2pm, Journey Room  > Creative Journey  3rd Tuesday of the month 1130am-1pm, Journey Room  > Look Good Feel Better  1st Wednesday of the month 10am-12 noon, Journey Room (Call [place] to register [phone])

## 2016-03-22 DIAGNOSIS — I2510 Atherosclerotic heart disease of native coronary artery without angina pectoris: Secondary | ICD-10-CM | POA: Insufficient documentation

## 2016-03-23 ENCOUNTER — Ambulatory Visit (INDEPENDENT_AMBULATORY_CARE_PROVIDER_SITE_OTHER): Payer: BLUE CROSS/BLUE SHIELD | Admitting: Interventional Cardiology

## 2016-03-23 ENCOUNTER — Encounter: Payer: Self-pay | Admitting: Interventional Cardiology

## 2016-03-23 VITALS — BP 120/64 | HR 57 | Ht 64.0 in | Wt 136.8 lb

## 2016-03-23 DIAGNOSIS — I2510 Atherosclerotic heart disease of native coronary artery without angina pectoris: Secondary | ICD-10-CM | POA: Diagnosis not present

## 2016-03-23 DIAGNOSIS — C210 Malignant neoplasm of anus, unspecified: Secondary | ICD-10-CM

## 2016-03-23 DIAGNOSIS — C7951 Secondary malignant neoplasm of bone: Secondary | ICD-10-CM

## 2016-03-23 DIAGNOSIS — C50919 Malignant neoplasm of unspecified site of unspecified female breast: Secondary | ICD-10-CM

## 2016-03-23 NOTE — Patient Instructions (Signed)
Medication Instructions:  Your physician recommends that you continue on your current medications as directed. Please refer to the Current Medication list given to you today.   Labwork: Please have your primary care physician draw a lipid and hepatic labs in 4 weeks. Please have them forward a copy of the results to our office  Testing/Procedures: None ordered  Follow-Up: Your physician recommends that you schedule a follow-up appointment as needed   Any Other Special Instructions Will Be Listed Below (If Applicable).     If you need a refill on your cardiac medications before your next appointment, please call your pharmacy.

## 2016-03-23 NOTE — Progress Notes (Signed)
Cardiology Office Note    Date:  03/23/2016   ID:  [name], DOB May 23, 1951, MRN [id number]  PCP:  Wende Neighbors, MD  Cardiologist: Sinclair Grooms, MD   Chief Complaint  Patient presents with  . Coronary Artery Disease    History of Present Illness:  [name] is a 65 y.o. female who is here for evaluation of asymptomatic coronary disease identified by chest CT as part of management for breast cancer and rectal cancer.  [name] is asymptomatic. Recently diagnosed with stage IV rectal cancer. Chest CT scan was performed looking for metastatic disease and identified heavy calcification in the LAD. She has a strong family history of coronary disease. She has no symptoms to suggest myocardial ischemia/angina.  She underwent a stress myocardial perfusion study earlier this summer that was negative for evidence of ischemia. We discussed the findings. EF was 58%. The study was felt to represent a normal study.  Past Medical History:  Diagnosis Date  . Adenocarcinoma of breast ([place]) 01/27/2011   stage IIIc breast cancer  . Bladder infection   . Bone metastasis ([place]) 01/17/2016  . Borderline hypertension   . Breast cancer ([place])   . Closed fracture of humerus sept 2013  . Fatigue   . Generalized headaches   . Osteoporosis 01/27/2011  . Pneumonia   . Port catheter in place 04/14/2013  . Post-mastectomy lymphedema syndrome   . 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 Reflux   . Retinal detachment   . 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 Swollen lymph nodes   . URI (upper respiratory infection) 02/17/12  . Wears glasses     Past Surgical History:  Procedure Laterality Date  . biopsy of lymph node     super clavicle  . BREAST SURGERY    . BTL    . CATARACT EXTRACTION    . cervical disc fusioni    . LYMPH NODE BIOPSY Right 06/19/2015   Procedure: RIGHT NECK LYMPH NODE EXCISION;  Surgeon: Rolm Bookbinder, MD;  Location:  [facility];  Service: General;  Laterality: Right;  . MASTECTOMY MODIFIED RADICAL    . PORT-A-CATH REMOVAL     in place (not removed)  . retinal detachment and repair      Current Medications: Outpatient Medications Prior to Visit  Medication Sig Dispense Refill  . atorvastatin (LIPITOR) 10 MG tablet Take 1 tablet (10 mg total) by mouth daily. 30 tablet 3  . calcium-vitamin D (OSCAL WITH D) 500-200 MG-UNIT per tablet Take 2 tablets by mouth daily.     . capecitabine (XELODA) 150 MG tablet Take 1450 mg twice a day 7 days on and 7 days off. 84 tablet 0  . capecitabine (XELODA) 500 MG tablet Take 1450 mg twice a day 7 days on and 7 days off 56 tablet 0  . diphenhydrAMINE (SOMINEX) 25 MG tablet Take 25 mg by mouth at bedtime as needed. To help with sleeping    . fentaNYL (DURAGESIC - DOSED MCG/HR) 25 MCG/HR patch Place 1 patch (25 mcg total) onto the skin every 3 (three) days. 10 patch 0  . HYDROcodone-acetaminophen (NORCO) 10-325 MG tablet Take 1 tablet by mouth every 6 (six) hours as needed. 10 tablet 0  . magnesium oxide (MAG-OX) 400 MG tablet Take 400 mg by mouth 2 (two) times daily.    . metoprolol tartrate (LOPRESSOR) 25 MG tablet Take 25 mg by  mouth daily.    . nitrofurantoin (MACRODANTIN) 100 MG capsule Take 100 mg by mouth 2 (two) times daily.    Marland Kitchen omeprazole (PRILOSEC) 40 MG capsule Take 1 capsule (40 mg total) by mouth 2 (two) times daily. 180 capsule 4  . ondansetron (ZOFRAN) 8 MG tablet Take 8 mg by mouth every 8 (eight) hours as needed for nausea or vomiting.    . traMADol (ULTRAM) 50 MG tablet One to two tablets by mouth every 6 hours as needed for pain 90 tablet 0  . vitamin B-12 (CYANOCOBALAMIN) 1000 MCG tablet Take 1,000 mcg by mouth daily.     No facility-administered medications prior to visit.      Allergies:   Ciprofloxacin   Social History   Social History  . Marital status: Divorced    Spouse name: N/A  . Number of children: N/A  . Years of  education: N/A   Social History Main Topics  . Smoking status: Never Smoker  . Smokeless tobacco: Never Used  . Alcohol use No  . Drug use: No  . Sexual activity: Not Asked   Other Topics Concern  . None   Social History Narrative  . None     Family History:  The patient's family history includes Heart disease in her father and mother; Hypertension in her mother; Other in her father.   ROS:   Please see the history of present illness.    Occasional rectal bleeding. Status post left mastectomy. On Zaloda as therapy for stage IV rectal cancer . All other systems reviewed and are negative.   PHYSICAL EXAM:   VS:  BP 120/64   Pulse (!) 57   Ht 5\' 4"  (1.626 m)   Wt 136 lb 12.8 oz (62.1 kg)   BMI 23.48 kg/m    GEN: Well nourished, well developed, in no acute distress  HEENT: normal  Neck: no JVD, carotid bruits, or masses Cardiac: RRR; no murmurs, rubs, or gallops,no edema  Respiratory:  clear to auscultation bilaterally, normal work of breathing GI: soft, nontender, nondistended, + BS MS: no deformity or atrophy  Skin: warm and dry, no rash Neuro:  Alert and Oriented x 3, Strength and sensation are intact Psych: euthymic mood, full affect  Wt Readings from Last 3 Encounters:  03/23/16 136 lb 12.8 oz (62.1 kg)  03/19/16 140 lb (63.5 kg)  03/16/16 142 lb (64.4 kg)      Studies/Labs Reviewed:   EKG:  EKG  Performed 06/17/15 revealing sinus bradycardia but otherwise unremarkable.  Recent Labs: 03/19/2016: ALT 18; BUN 17; Creatinine, Ser 0.99; Hemoglobin 10.9; Magnesium 1.8; Platelets 161; Potassium 4.0; Sodium 137   Lipid Panel No results found for: CHOL, TRIG, HDL, CHOLHDL, VLDL, LDLCALC, LDLDIRECT  Additional studies/ records that were reviewed today include:  Stress tests 03/16/16: Study Highlights     The left ventricular ejection fraction is normal (55-65%).  Nuclear stress EF: 58%.  There was no ST segment deviation noted during stress.  The study is  normal.        ASSESSMENT:    1. Coronary artery calcification seen on CT scan   2. Adenocarcinoma of breast, unspecified laterality ([place])   3. Squamous cell carcinoma of anus (HCC)   4. Bone metastasis ([place])      PLAN:  In order of problems listed above:  1. No ischemia demonstrated by myocardial perfusion study. Therefore "age advanced LAD calcification" by CT scan is not indicative of obstructive coronary disease. This does represent  coronary atherosclerosis and primary prevention of cardiac events will be our goal going forward. We discussed LDL cholesterol of 70 or less, blood pressure control, weight loss, avoiding tobacco smoke exposure, and glycemic control. Her oncologist recently started Lipitor 10 mg daily and follow-up laboratory tests have not been done.     Medication Adjustments/Labs and Tests Ordered: Current medicines are reviewed at length with the patient today.  Concerns regarding medicines are outlined above.  Medication changes, Labs and Tests ordered today are listed in the Patient Instructions below. Patient Instructions  Medication Instructions:  Your physician recommends that you continue on your current medications as directed. Please refer to the Current Medication list given to you today.   Labwork: Please have your primary care physician draw a lipid and hepatic labs in 4 weeks. Please have them forward a copy of the results to our office  Testing/Procedures: None ordered  Follow-Up: Your physician recommends that you schedule a follow-up appointment as needed   Any Other Special Instructions Will Be Listed Below (If Applicable).     If you need a refill on your cardiac medications before your next appointment, please call your pharmacy.      Signed, Sinclair Grooms, MD  03/23/2016 12:47 PM    [facility] [address], [place], [place]  [address] Phone: [phone]; Fax: [phone]

## 2016-03-26 ENCOUNTER — Telehealth (HOSPITAL_COMMUNITY): Payer: Self-pay | Admitting: Emergency Medicine

## 2016-03-26 NOTE — Telephone Encounter (Signed)
Called and stated that Tuesday she finished her 7 days of her 1450mg  Xelado.  Day 1 of her off day her palms and feet started to get sore.  Kirby Crigler PA notified.  She can take pain medication.  Call Monday if it gets worse.  We will notify her of her new dose before she is suppose to start back. Pt verbalized understanding.

## 2016-03-30 ENCOUNTER — Telehealth (HOSPITAL_COMMUNITY): Payer: Self-pay | Admitting: *Deleted

## 2016-03-31 ENCOUNTER — Other Ambulatory Visit (HOSPITAL_COMMUNITY): Payer: Self-pay | Admitting: Oncology

## 2016-03-31 ENCOUNTER — Other Ambulatory Visit (HOSPITAL_COMMUNITY): Payer: Self-pay | Admitting: Hematology & Oncology

## 2016-03-31 ENCOUNTER — Telehealth (HOSPITAL_COMMUNITY): Payer: Self-pay | Admitting: *Deleted

## 2016-03-31 DIAGNOSIS — C210 Malignant neoplasm of anus, unspecified: Secondary | ICD-10-CM

## 2016-03-31 MED ORDER — FENTANYL 25 MCG/HR TD PT72: 25.0000 ug | MEDICATED_PATCH | TRANSDERMAL | 0 refills | Status: DC

## 2016-03-31 NOTE — Telephone Encounter (Signed)
Pt  Called in reference to Xeloda reaction. She states she is having soreness in palms and soles of feet. She also states she is out of fentanyl patches. Dr. Whitney Muse is aware. Dr. Whitney Muse advises pt to stop Xeloda until seen and for pt to stop by [facility] for refill of Fentanyl Patches tomorrow. Pt verbalized understanding.

## 2016-04-15 ENCOUNTER — Telehealth (HOSPITAL_COMMUNITY): Payer: Self-pay | Admitting: *Deleted

## 2016-04-17 NOTE — Progress Notes (Signed)
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 female with a medical history significant for Stage IV ER/PR+ breast cancer with initial diagnosis showing HER2 POSITIVITY initially diagnosed with Stage IIIC disease in 2012 and treated curative intent with metastatic disease discovered in October 2015 and osseous involvement noted on PET imaging on 09/10/2014, who is referred to the [facility] for transfer of care with Stage IV breast cancer and newly diagnosed squamous cell carcinoma of anus.    Adenocarcinoma of left breast   04/09/2008 Pathology Results    Positive for breast cancer, ER 100%, PR 5%, HER-2/neu POSITIVE.      04/09/2008 Procedure    Biopsy of left axillary node mass       04/16/2008 - 08/13/2008 Chemotherapy    Dose dense epirubicin/Cytoxan 4 cycles followed by docetaxel 4 cycles. Herceptin initiated with docetaxel for a total of 52 weeks.      08/30/2008 Definitive Surgery    Left mastectomy revealing persistence of disease in the breast and lymph nodes.      09/17/2008 - 11/26/2008 Radiation Therapy    Radiation to left chest wall, supraclavicular, and axillary nodal areas.      11/27/2008 - 04/20/2011 Anti-estrogen oral therapy    Letrozole      03/30/2011 Procedure    Left supraclavicular lymph node biopsy      03/30/2011 Pathology Results    Recurrent breast cancer, invasive ductal type, ER 100%, PR 40%, HER-2 NEGATIVE      04/21/2011 - 06/04/2014 Anti-estrogen oral therapy    Tamoxifen      05/25/2014 PET scan    Progression in left submandibular lymph node, mediastinal lymph nodes, and probable lung involvement.      05/25/2014 Progression    PET scan demonstrates progression of disease.      06/13/2014 Procedure    Left supraclavicular mass biopsy      06/13/2014 Pathology Results    Metastatic carcinoma consistent with breast primary, involving skeletal muscle, perineural invasion, LVI, no lymphoid tissue identified, ER 98%, PR negative, HER-2 negative      06/25/2014 - 10/09/2014 Chemotherapy    Carboplatin/Taxol 6 cycles with excellent response      11/06/2014 - 06/10/2015 Chemotherapy    Palbociclib and Faslodex      01/08/2015 Adverse Reaction    Grade 3 neutropenia with cycle #2 of Palbociclib.      01/08/2015 Treatment Plan Change    Palbociclib dose reduced to 100 mg for cycle #3.      06/10/2015 Progression    Right cervical lymph node progression clinically developed over 4 days.  This lymph node was positive on PET scan in September 2016 and at that time was approximately 8 mm with possible progression in a single bone metastasis.      06/17/2015 Procedure    Biopsy of right cervical lymph node      06/17/2015 Pathology Results    Genetic sequencing performed revealing BRCA2 positivity  07/02/2015 - 08/01/2015 Chemotherapy    Exemestane/everolimus, 25 mg/ 5 mg x 7 days then increasing to 10 mg daily.      08/01/2015 PET scan    Diffuse pneumonitis.        08/01/2015 Adverse Reaction    Pneumonitis secondary to drug reaction from everolimus.      08/23/2015 - 12/23/2015 Chemotherapy    Cisplatin 40 mg/m every other week      11/15/2015 PET scan    Interval resolution of hypermetabolic airspace opacities in the lungs which were likely inflammatory. Abnormal high activity at the anal rectal junction. Very low grade residual activity at the site of L3 metastatic lesion.      12/23/2015 Treatment Plan Change    Holiday from treatment in  preparation for trip to [place].      04/20/2016 -  Chemotherapy    XELODA 1300 mg po bid 7 days on/ 7 days off        Squamous cell carcinoma of anus ([place])   12/31/2015 Procedure    Colonoscopy by Dr. Britta Mccreedy      12/31/2015 Pathology Results    Invasive squamous cell carcinoma approximate 4 cm mass posterior wall of rectum      01/13/2016 Treatment Plan Change    Dr. Leighton Ruff consultation- no surgery.  Recommend chemoXRT      01/16/2016 Pathology Results    HPV POSITIVE      02/07/2016 -  Chemotherapy    Xeloda 1450 mg PO BID Sunday PM- Friday AM during XRT      02/07/2016 -  Radiation Therapy    8/1 is anticipated completion date.      02/19/2016 Treatment Plan Change    Xeloda on hold due to increased GERD symtpoms.  PPI dose increased.  Will restart Xeloda tomorrow, 02/21/2016.      02/23/2016 Treatment Plan Change    Xeloda dose decreased to 1150 mg PO BID Sunday PM- Friday AM during XRT       Chart is reviewed. Oncology history is developed. Staging is completed. All dictations are reviewed.   Newly diagnosed squamous cell rectal cancer has been evaluated by a radiation therapy standpoint. She saw Dr. Lisbeth Renshaw on 01/10/2016. I have his note available and this is reviewed:  One of Dr. Jaclyn Prime primary concern is that of attempting to spare the patient's bone marrow reserve is to maintain the ability to tolerate chemotherapy given that the dominant life threatening issue remains her metastatic breast cancer. However, the patient is symptomatic from her rectal cancer and this also needs to be addressed with local therapy. The patient is seeing Dr. Marcello Moores to discuss possible surgery. If this is felt to be warranted as initial treatment I believe this would be reasonable and I would not street feel strongly about preceding this with radiation treatment or chemoradiation treatment. However, if the patient will not undergo surgery after this discussion, and I would recommend  palliative radiation treatment to the pelvis. This could be done in a focal manner to a greater extent than normal for rectal cancer and sparing of the bone marrow could be achieved to a much greater extent than a traditional rectal cancer plan. The patient's treatment could consist of traditional 5-1/2 week course of treatment or a shorter course of treatment such as a two-week course. One additional alternative would involve 5 Gy5 fraction as is often more commonly done in [place], which also may be a good short course option for the patient. With  the diagnosis of squamous cell cancer, the patient may have a very radiosensitive tumor if she does require palliative radiation therapy.  All of this was discussed with the patient. We will follow up on her discussion with Dr. Marcello Moores on Monday and then proceed accordingly. The patient could undergo simulation in a week on Thursday if this appears appropriate. All the patient's questions were answered today and she states that she is comfortable with this plan.  Ms. Lowden is unaccompanied. I personally reviewed and went over laboratory studies with the patient.  She feels good overall.  She reports her foot neuropathy was worse with her initial cycle of XELODA, making it hard for her to walk. About one week and a half ago, she was up cleaning her house for most of the day and by that night her right foot was red and felt like a layer of skin had come off. She has since been "babying" her feet to avoid this severe of a reaction. Now, she is still able to do the things she wants to do and is no more clumsy than usual. She rides horses and just spent the weekend bike riding without any difficulty.  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  She reports her appetite is decreased while on the Xeloda. She has not noticed any weight loss as she makes herself eat. She denies any nausea while on the medication.   She has Xeloda at home.   She continues to have uncomfortable  bowel movements since radiation, but notes that they improve from week to week.   She denies mouth sores.   She had her most recent rectal exam with her radiation oncologist, Dr. Lisbeth Renshaw, on August 31st. She does not have scheduled follow-up with Dr. Marcello Moores.    Review of Systems  Constitutional: Negative.  Negative for chills, fever and weight loss.       Decreased appetite with Xeloda  HENT: Negative.  Negative for hearing loss and tinnitus.   Eyes: Negative.  Negative for blurred vision and double vision.  Respiratory: Negative.  Negative for cough, hemoptysis, sputum production, shortness of breath and wheezing.   Cardiovascular: Negative.  Negative for chest pain.  Gastrointestinal: Negative for abdominal pain, constipation, melena, nausea and vomiting.  Genitourinary: Negative.  Negative for dysuria, frequency, hematuria and urgency.  Musculoskeletal: Negative.  Negative for back pain, falls, joint pain and myalgias.  Skin: Negative.   Neurological: Positive for tingling. Negative for dizziness, sensory change, speech change, focal weakness, seizures, loss of consciousness, weakness and headaches.       Hand and foot neuropathy; worsening foot neuropathy  Endo/Heme/Allergies: Negative.   Psychiatric/Behavioral: Negative.   14 point review of systems was performed and is negative except as detailed under history of present illness and above  PAST MEDICAL HISTORY:   Past Medical History:  Diagnosis Date  . Adenocarcinoma of breast ([place]) 01/27/2011   stage IIIc breast cancer  . Bladder infection   . Bone metastasis ([place]) 01/17/2016  . Borderline hypertension   . Breast cancer ([place])   . Closed fracture of humerus sept 2013  . Fatigue   . Generalized headaches   . Osteoporosis 01/27/2011  . Pneumonia   . Port catheter in place 04/14/2013  . Post-mastectomy lymphedema syndrome   . 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 Reflux   . Retinal detachment   . Squamous cell carcinoma of anus ([facility])  01/17/2016  . 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 Swollen lymph nodes   .  URI (upper respiratory infection) 02/17/12  . Wears glasses     ALLERGIES: Allergies  Allergen Reactions  . Ciprofloxacin Other (See Comments)    tendonitis      MEDICATIONS: I have reviewed the patient's current medications.    Current Outpatient Prescriptions on File Prior to Visit  Medication Sig Dispense Refill  . atorvastatin (LIPITOR) 10 MG tablet Take 1 tablet (10 mg total) by mouth daily. 30 tablet 3  . calcium-vitamin D (OSCAL WITH D) 500-200 MG-UNIT per tablet Take 2 tablets by mouth daily.     . capecitabine (XELODA) 150 MG tablet Take 1450 mg twice a day 7 days on and 7 days off. (Patient taking differently: Take 1300 mg twice a day 7 days on and 7 days off.) 84 tablet 0  . capecitabine (XELODA) 500 MG tablet Take 1450 mg twice a day 7 days on and 7 days off (Patient taking differently: Take 1300 mg twice a day 7 days on and 7 days off) 56 tablet 0  . diphenhydrAMINE (SOMINEX) 25 MG tablet Take 25 mg by mouth at bedtime as needed. To help with sleeping    . fentaNYL (DURAGESIC - DOSED MCG/HR) 25 MCG/HR patch Place 1 patch (25 mcg total) onto the skin every 3 (three) days. 10 patch 0  . HYDROcodone-acetaminophen (NORCO) 10-325 MG tablet Take 1 tablet by mouth every 6 (six) hours as needed. 10 tablet 0  . magnesium oxide (MAG-OX) 400 MG tablet Take 400 mg by mouth 2 (two) times daily.    . metoprolol tartrate (LOPRESSOR) 25 MG tablet Take 25 mg by mouth daily.    . nitrofurantoin (MACRODANTIN) 100 MG capsule Take 100 mg by mouth 2 (two) times daily.    Marland Kitchen omeprazole (PRILOSEC) 40 MG capsule Take 1 capsule (40 mg total) by mouth 2 (two) times daily. 180 capsule 4  . ondansetron (ZOFRAN) 8 MG tablet Take 8 mg by mouth every 8 (eight) hours as needed for nausea or vomiting.    . traMADol (ULTRAM) 50 MG tablet One to two tablets by mouth every 6 hours as needed for pain 90 tablet 0  . vitamin  B-12 (CYANOCOBALAMIN) 1000 MCG tablet Take 1,000 mcg by mouth daily.     No current facility-administered medications on file prior to visit.      PAST SURGICAL HISTORY Past Surgical History:  Procedure Laterality Date  . biopsy of lymph node     super clavicle  . BREAST SURGERY    . BTL    . CATARACT EXTRACTION    . cervical disc fusioni    . LYMPH NODE BIOPSY Right 06/19/2015   Procedure: RIGHT NECK LYMPH NODE EXCISION;  Surgeon: Rolm Bookbinder, MD;  Location: [facility];  Service: General;  Laterality: Right;  . MASTECTOMY MODIFIED RADICAL    . PORT-A-CATH REMOVAL     in place (not removed)  . retinal detachment and repair      FAMILY HISTORY: Family History  Problem Relation Age of Onset  . Hypertension Mother   . Heart disease Mother     atrial fib  . Heart disease Father   . Other Father     glaucoma    SOCIAL HISTORY:  reports that she has never smoked. She has never used smokeless tobacco. She reports that she does not drink alcohol or use drugs.  Social History   Social History  . Marital status: Divorced    Spouse name: N/A  . Number of children: N/A  .  Years of education: N/A   Social History Main Topics  . Smoking status: Never Smoker  . Smokeless tobacco: Never Used  . Alcohol use No  . Drug use: No  . Sexual activity: Not Asked   Other Topics Concern  . None   Social History Narrative  . None    PERFORMANCE STATUS: The patient's performance status is 1 - Symptomatic but completely ambulatory  PHYSICAL EXAM: Most Recent Vital Signs: Blood pressure 106/77, pulse 60, temperature 98.8 F (37.1 C), temperature source Oral, resp. rate 16, weight 137 lb 14.4 oz (62.6 kg), SpO2 100 %. General appearance: alert, cooperative, appears stated age, no distress and smiling and unaccompanied.  Wears glasses. Head: Normocephalic, without obvious abnormality, atraumatic Eyes: negative findings: lids and lashes normal, conjunctivae and  sclerae normal and corneas clear Throat: normal findings: oropharynx pink & moist without lesions or evidence of thrush Neck: no adenopathy, supple, symmetrical, trachea midline and thyroid not enlarged, symmetric, no tenderness/mass/nodules Lungs: clear to auscultation bilaterally Heart: regular rate and rhythm, S1, S2 normal, no murmur, click, rub or gallop Abdomen: soft, non-tender; bowel sounds normal; no masses,  no organomegaly Extremities: extremities normal, atraumatic, no cyanosis or edema and left UE lympedema sleeve in place Skin: Skin color, texture, turgor normal. No rashes or lesions Lymph nodes: Cervical, supraclavicular, and axillary nodes normal. Neurologic: Grossly normal  LABORATORY DATA:  I have reviewed the data as listed. Results for orders placed or performed in visit on 04/20/16 (from the past 48 hour(s))  CBC with Differential     Status: Abnormal   Collection Time: 04/20/16 10:09 AM  Result Value Ref Range   WBC 5.2 4.0 - 10.5 K/uL   RBC 3.32 (L) 3.87 - 5.11 MIL/uL   Hemoglobin 11.9 (L) 12.0 - 15.0 g/dL   HCT 36.4 36.0 - 46.0 %   MCV 109.6 (H) 78.0 - 100.0 fL   MCH 35.8 (H) 26.0 - 34.0 pg   MCHC 32.7 30.0 - 36.0 g/dL   RDW 13.6 11.5 - 15.5 %   Platelets 198 150 - 400 K/uL   Neutrophils Relative % 69 %   Neutro Abs 3.5 1.7 - 7.7 K/uL   Lymphocytes Relative 23 %   Lymphs Abs 1.2 0.7 - 4.0 K/uL   Monocytes Relative 8 %   Monocytes Absolute 0.4 0.1 - 1.0 K/uL   Eosinophils Relative 0 %   Eosinophils Absolute 0.0 0.0 - 0.7 K/uL   Basophils Relative 0 %   Basophils Absolute 0.0 0.0 - 0.1 K/uL  Comprehensive metabolic panel     Status: Abnormal   Collection Time: 04/20/16 10:09 AM  Result Value Ref Range   Sodium 137 135 - 145 mmol/L   Potassium 4.1 3.5 - 5.1 mmol/L   Chloride 102 101 - 111 mmol/L   CO2 26 22 - 32 mmol/L   Glucose, Bld 100 (H) 65 - 99 mg/dL   BUN 15 6 - 20 mg/dL   Creatinine, Ser 1.00 0.44 - 1.00 mg/dL   Calcium 9.4 8.9 - 10.3 mg/dL    Total Protein 6.7 6.5 - 8.1 g/dL   Albumin 4.1 3.5 - 5.0 g/dL   AST 31 15 - 41 U/L   ALT 21 14 - 54 U/L   Alkaline Phosphatase 60 38 - 126 U/L   Total Bilirubin 0.5 0.3 - 1.2 mg/dL   GFR calc non Af Amer 59 (L) >60 mL/min   GFR calc Af Amer >60 >60 mL/min    Comment: (NOTE) The  eGFR has been calculated using the CKD EPI equation. This calculation has not been validated in all clinical situations. eGFR's persistently <60 mL/min signify possible Chronic Kidney Disease.    Anion gap 9 5 - 15      RADIOGRAPHY: I have personally reviewed the radiological images as listed and agreed with the findings in the report.   CLINICAL DATA: New diagnosis of anal cancer. Rectal pain. Left breast cancer 8 years ago with mastectomy. Status post chemotherapy and radiation therapy. History of bone metastasis.  EXAM: CT CHEST, ABDOMEN, AND PELVIS WITH CONTRAST  TECHNIQUE: Multidetector CT imaging of the chest, abdomen and pelvis was performed following the standard protocol during bolus administration of intravenous contrast.  CONTRAST: 164m ISOVUE-300 IOPAMIDOL (ISOVUE-300) INJECTION 61%  COMPARISON: PET 11/15/2015. Pelvic CT of 01/16/2016.  FINDINGS: CT CHEST FINDINGS  Mediastinum/Lymph Nodes: No supraclavicular adenopathy. A right Port-A-Cath which terminates at the low SVC. No axillary adenopathy. Left mastectomy and axillary node dissection. Mild cardiomegaly. No pericardial effusion. Lad coronary artery atherosclerosis. No central pulmonary embolism, on this non-dedicated study. No mediastinal or hilar adenopathy.  Dilated lower esophagus with fluid level within on image 45/series 2.  Lungs/Pleura: No pleural fluid. 5 mm right upper lobe nodular density on image 40/series 7 is similar to on the prior PET.  Anterior left upper lobe radiation fibrosis and pleural thickening.  Musculoskeletal: Remote lower bilateral rib trauma.  CT ABDOMEN PELVIS  FINDINGS  Hepatobiliary: Normal liver. Normal gallbladder, without biliary ductal dilatation.  Pancreas: Normal, without mass or ductal dilatation.  Spleen: Normal in size, without focal abnormality.  Adrenals/Urinary Tract: Normal adrenal glands. Normal kidneys, without hydronephrosis. Normal urinary bladder.  Stomach/Bowel: Normal stomach, without wall thickening. No dominant anal rectal mass identified. Minimal motion degradation in the upper pelvis. Otherwise normal colon, terminal ileum, and appendix. Normal small bowel.  Vascular/Lymphatic: Aortic and branch vessel atherosclerosis. No retroperitoneal or retrocrural adenopathy. No pelvic sidewall adenopathy. No perianal or perirectal adenopathy.  Reproductive: Normal uterus and adnexa. No adnexal mass.  Other: No significant free fluid. No evidence of omental or peritoneal disease.  Musculoskeletal: Right sided L1 vertebral body lesion measures 1.7 cm and is not significantly changed. Anterior L3 sclerotic lesion is similar, 2.8 cm.  IMPRESSION: 1. No anorectal primary identified. No evidence of soft tissue metastasis. 2. Left mastectomy and axillary node dissection. Similar sclerotic osseous lesions. 3. Esophageal air fluid level suggests dysmotility or gastroesophageal reflux. 4. Age advanced coronary artery atherosclerosis. Recommend assessment of coronary risk factors and consideration of medical therapy.   Electronically Signed  By: KAbigail MiyamotoM.D.  On: 01/22/2016 13:34     PATHOLOGY:  N/A   ASSESSMENT/PLAN:  Excellent PS Squ[place]  Formal opinion from GI tumor board was obtained and recommendations for XELODA with concurrent XRT were noted. She completed therapy. Is making an excellent recovery. Will need upcoming imaging. We can discuss this at follow-up in 2 weeks.    Adenocarcinoma of left breast BRCA2 Positive XELODA related skin toxicity  Stage  IV breast cancer ER/PR positive, HER-2/neu negative. BRCA2 POSITIVE.  History of stage IIIc left breast cancer in 2009 treated with neoadjuvant chemotherapy consisting of epirubicin and Cytoxan followed by 4 cycles of docetaxel from 04/16/2008-08/13/2008 with the initiation of Herceptin for 52 weeks at the start of docetaxel. She then underwent a left mastectomy on 08/30/2008 showing persistent disease in the breast and lymph nodes. This was followed by radiation therapy from 09/17/2008-11/26/2008 to the left chest wall, supraclavicular,  and axillary node areas. She then moved on to antiestrogen therapy with letrozole (11/27/2008-04/20/2011). She was found to have recurrent breast cancer on supraclavicular lymph node biopsy on 03/30/2011 leading to a change in antiestrogen therapy to tamoxifen (04/21/2011-06/04/2014). Pet imaging on 05/25/2014 demonstrated progression of left submandibular lymph node, mediastinal lymph node, and probable lung involvement. This was followed by a biopsy on 06/13/2014 of a left supraclavicular mass that did reveal metastatic carcinoma consistent with breast primary. She was then treated with carboplatin and Taxol for 6 cycles with an excellent response (06/25/2014-10/09/2014). She was then transition back to hormone therapy consisting of Ibrance and Faslodex (11/13/2014-06/10/2015). This was complicated by grade 3 neutropenia with cycle #2 resulting in a dose reduction of Ibrance to 100 mg for cycle 3. Unfortunately, on 06/10/2015, she was noted to have progression of disease in the right mid cervical lymph node over a 4 day period that was PET avid. Therapy was therefore changed to exemestane and everolimus (64/68/0321-22/48/2500) that was complicated by an adverse reaction consisting of diffuse pneumonitis. This regimen was therefore discontinued. She then transitioned back to systemic chemotherapy consisting of cisplatin every 14 days (08/23/2015-12/23/2015). She was put on a drug  holiday beginning on 12/23/2015 in preparation for a trip to [place] on 01/25/2016.  She will continue XELODA after completion of XRT for her STAGE IV breast cancer. She did continue with XELODA in a week on/week off fashion. She developed sore/peeling feet. Drug has been held since 8/27.  We will decrease her XELODA to 1300 mg po bid 7 on/7 off. She will restart. I will see her back in 2 weeks. She knows to call in the interim with problems.    Bone metastasis ([facility]) Bone metastases from breast cancer. On Xgeva therapy. She is also taking calcium and vitamin D.  Supportive therapy plan is built.   TREATMENT related PAIN/Opioid induced constipation  Continue current pain regimen. This is working for her.  RTC 2 weeks.  Orders Placed This Encounter  Procedures  . CBC with Differential    Standing Status:   Future    Standing Expiration Date:   04/20/2017  . Comprehensive metabolic panel    Standing Status:   Future    Standing Expiration Date:   04/20/2017  . Cancer antigen 27.29    Standing Status:   Future    Standing Expiration Date:   04/20/2017  . Cancer antigen 15-3    Standing Status:   Future    Standing Expiration Date:   04/20/2017    All questions were answered. The patient knows to call the clinic with any problems, questions or concerns. We can certainly see the patient much sooner if necessary.  This document serves as a record of services personally performed by Ancil Linsey, MD. It was created on her behalf by Toni Amend, a trained medical scribe. The creation of this record is based on the scribe's personal observations and the provider's statements to them. This document has been checked and approved by the attending provider.  I have reviewed the above documentation for accuracy and completeness, and I agree with the above.  This note is electronically signed by: Molli Hazard, MD   04/20/2016 2:07 PM

## 2016-04-20 ENCOUNTER — Encounter (HOSPITAL_COMMUNITY): Payer: Self-pay | Admitting: Hematology & Oncology

## 2016-04-20 ENCOUNTER — Encounter (HOSPITAL_COMMUNITY): Payer: BLUE CROSS/BLUE SHIELD | Attending: Hematology & Oncology | Admitting: Hematology & Oncology

## 2016-04-20 ENCOUNTER — Ambulatory Visit (HOSPITAL_COMMUNITY): Payer: BLUE CROSS/BLUE SHIELD

## 2016-04-20 ENCOUNTER — Encounter (HOSPITAL_COMMUNITY): Payer: BLUE CROSS/BLUE SHIELD

## 2016-04-20 ENCOUNTER — Encounter (HOSPITAL_BASED_OUTPATIENT_CLINIC_OR_DEPARTMENT_OTHER): Payer: BLUE CROSS/BLUE SHIELD

## 2016-04-20 VITALS — BP 106/77 | HR 60 | Temp 98.8°F | Resp 16 | Wt 137.9 lb

## 2016-04-20 DIAGNOSIS — C50912 Malignant neoplasm of unspecified site of left female breast: Secondary | ICD-10-CM | POA: Diagnosis not present

## 2016-04-20 DIAGNOSIS — T451X5A Adverse effect of antineoplastic and immunosuppressive drugs, initial encounter: Secondary | ICD-10-CM

## 2016-04-20 DIAGNOSIS — C210 Malignant neoplasm of anus, unspecified: Secondary | ICD-10-CM | POA: Diagnosis present

## 2016-04-20 DIAGNOSIS — D709 Neutropenia, unspecified: Secondary | ICD-10-CM | POA: Insufficient documentation

## 2016-04-20 DIAGNOSIS — Z853 Personal history of malignant neoplasm of breast: Secondary | ICD-10-CM | POA: Insufficient documentation

## 2016-04-20 DIAGNOSIS — Z1509 Genetic susceptibility to other malignant neoplasm: Secondary | ICD-10-CM

## 2016-04-20 DIAGNOSIS — Z1501 Genetic susceptibility to malignant neoplasm of breast: Secondary | ICD-10-CM

## 2016-04-20 DIAGNOSIS — G893 Neoplasm related pain (acute) (chronic): Secondary | ICD-10-CM

## 2016-04-20 DIAGNOSIS — K219 Gastro-esophageal reflux disease without esophagitis: Secondary | ICD-10-CM | POA: Insufficient documentation

## 2016-04-20 DIAGNOSIS — Z9012 Acquired absence of left breast and nipple: Secondary | ICD-10-CM | POA: Insufficient documentation

## 2016-04-20 DIAGNOSIS — Z95828 Presence of other vascular implants and grafts: Secondary | ICD-10-CM

## 2016-04-20 DIAGNOSIS — R234 Changes in skin texture: Secondary | ICD-10-CM

## 2016-04-20 DIAGNOSIS — C7951 Secondary malignant neoplasm of bone: Secondary | ICD-10-CM | POA: Diagnosis not present

## 2016-04-20 DIAGNOSIS — R239 Unspecified skin changes: Secondary | ICD-10-CM

## 2016-04-20 LAB — COMPREHENSIVE METABOLIC PANEL
ALT: 21 U/L (ref 14–54)
AST: 31 U/L (ref 15–41)
Albumin: 4.1 g/dL (ref 3.5–5.0)
Alkaline Phosphatase: 60 U/L (ref 38–126)
Anion gap: 9 (ref 5–15)
BUN: 15 mg/dL (ref 6–20)
CO2: 26 mmol/L (ref 22–32)
Calcium: 9.4 mg/dL (ref 8.9–10.3)
Chloride: 102 mmol/L (ref 101–111)
Creatinine, Ser: 1 mg/dL (ref 0.44–1.00)
GFR calc Af Amer: >60 mL/min (ref >60)
GFR calc non Af Amer: 59 mL/min — ABNORMAL LOW (ref >60)
Glucose, Bld: 100 mg/dL — ABNORMAL HIGH (ref 65–99)
Potassium: 4.1 mmol/L (ref 3.5–5.1)
Sodium: 137 mmol/L (ref 135–145)
Total Bilirubin: 0.5 mg/dL (ref 0.3–1.2)
Total Protein: 6.7 g/dL (ref 6.5–8.1)

## 2016-04-20 LAB — CBC WITH DIFFERENTIAL/PLATELET
Basophils Absolute: 0 10*3/uL (ref 0.0–0.1)
Basophils Relative: 0 %
Eosinophils Absolute: 0 10*3/uL (ref 0.0–0.7)
Eosinophils Relative: 0 %
HCT: 36.4 % (ref 36.0–46.0)
Hemoglobin: 11.9 g/dL — ABNORMAL LOW (ref 12.0–15.0)
Lymphocytes Relative: 23 %
Lymphs Abs: 1.2 10*3/uL (ref 0.7–4.0)
MCH: 35.8 pg — ABNORMAL HIGH (ref 26.0–34.0)
MCHC: 32.7 g/dL (ref 30.0–36.0)
MCV: 109.6 fL — ABNORMAL HIGH (ref 78.0–100.0)
Monocytes Absolute: 0.4 10*3/uL (ref 0.1–1.0)
Monocytes Relative: 8 %
Neutro Abs: 3.5 10*3/uL (ref 1.7–7.7)
Neutrophils Relative %: 69 %
Platelets: 198 10*3/uL (ref 150–400)
RBC: 3.32 MIL/uL — ABNORMAL LOW (ref 3.87–5.11)
RDW: 13.6 % (ref 11.5–15.5)
WBC: 5.2 10*3/uL (ref 4.0–10.5)

## 2016-04-20 MED ORDER — DENOSUMAB 120 MG/1.7ML ~~LOC~~ SOLN
120.0000 mg | Freq: Once | SUBCUTANEOUS | Status: AC
  Administered 2016-04-20: 120 mg via SUBCUTANEOUS
  Filled 2016-04-20: qty 1.7

## 2016-04-20 NOTE — Patient Instructions (Signed)
[place] at [facility] Discharge Instructions  RECOMMENDATIONS MADE BY THE CONSULTANT AND ANY TEST RESULTS WILL BE SENT TO YOUR REFERRING PHYSICIAN.  Xgeva injection given today. Return as scheduled  Thank you for choosing [place] at [facility] to provide your oncology and hematology care.  To afford each patient quality time with our provider, please arrive at least 15 minutes before your scheduled appointment time.   Beginning January 23rd 2017 lab work for the [facility] will be done in the  Main lab at [facility] on 1st floor. If you have a lab appointment with the [facility] please come in thru the  Main Entrance and check in at the main information desk  You need to re-schedule your appointment should you arrive 10 or more minutes late.  We strive to give you quality time with our providers, and arriving late affects you and other patients whose appointments are after yours.  Also, if you no show three or more times for appointments you may be dismissed from the clinic at the providers discretion.     Again, thank you for choosing [facility].  Our hope is that these requests will decrease the amount of time that you wait before being seen by our physicians.       _____________________________________________________________  Should you have questions after your visit to [facility], please contact our office at [phone] between the hours of 8:30 a.m. and 4:30 p.m.  Voicemails left after 4:30 p.m. will not be returned until the following business day.  For prescription refill requests, have your pharmacy contact our office.         Resources For Cancer Patients and their Caregivers ? American Cancer Society: Can assist with transportation, wigs, general needs, runs Look Good Feel Better.        (262)322-5768 ? Cancer Care: Provides financial assistance, online support groups,  medication/co-pay assistance.  1-800-813-HOPE 404-533-4825) ? [facility] Assists [place] cancer patients and their families through emotional , educational and financial support.  315-201-7422 ? [place] DSS Where to apply for food stamps, Medicaid and utility assistance. (719)460-2128 ? RCATS: Transportation to medical appointments. 312-079-0720 ? Social Security Administration: May apply for disability if have a Stage IV cancer. 217 565 9013 (512) 761-1532 ? [employer], Disability and Transit Services: Assists with nutrition, care and transit needs. [place] Support Programs: @10RELATIVEDAYS @ > Cancer Support Group  2nd Tuesday of the month 1pm-2pm, Journey Room  > Creative Journey  3rd Tuesday of the month 1130am-1pm, Journey Room  > Look Good Feel Better  1st Wednesday of the month 10am-12 noon, Journey Room (Call [place] to register [phone])

## 2016-04-20 NOTE — Patient Instructions (Signed)
[place] at [facility] Discharge Instructions  RECOMMENDATIONS MADE BY THE CONSULTANT AND ANY TEST RESULTS WILL BE SENT TO YOUR REFERRING PHYSICIAN.  You saw Dr. Whitney Muse today Follow up in 2 weeks with labs Start Xeloda 1300mg  twice daily- 7 days on, 7 days off   Thank you for choosing [place] at [facility] to provide your oncology and hematology care.  To afford each patient quality time with our provider, please arrive at least 15 minutes before your scheduled appointment time.   Beginning January 23rd 2017 lab work for the [facility] will be done in the  Main lab at [facility] on 1st floor. If you have a lab appointment with the [facility] please come in thru the  Main Entrance and check in at the main information desk  You need to re-schedule your appointment should you arrive 10 or more minutes late.  We strive to give you quality time with our providers, and arriving late affects you and other patients whose appointments are after yours.  Also, if you no show three or more times for appointments you may be dismissed from the clinic at the providers discretion.     Again, thank you for choosing [facility]  Our hope is that these requests will decrease the amount of time that you wait before being seen by our physicians.       _____________________________________________________________  Should you have questions after your visit to [facility], please contact our office at [phone] between the hours of 8:30 a.m. and 4:30 p.m.  Voicemails left after 4:30 p.m. will not be returned until the following business day.  For prescription refill requests, have your pharmacy contact our office.         Resources For Cancer Patients and their Caregivers ? American Cancer Society: Can assist with transportation, wigs, general needs, runs Look Good Feel Better.        (908)608-7577 ? Cancer  Care: Provides financial assistance, online support groups, medication/co-pay assistance.  1-800-813-HOPE 812-847-3792) ? [facility] Assists [place] cancer patients and their families through emotional , educational and financial support.  212-281-7752 ? [place] DSS Where to apply for food stamps, Medicaid and utility assistance. 361-464-3673 ? RCATS: Transportation to medical appointments. 351-191-7429 ? Social Security Administration: May apply for disability if have a Stage IV cancer. 505-177-7711 617-546-6246 ? [employer], Disability and Transit Services: Assists with nutrition, care and transit needs. [place] Support Programs: @10RELATIVEDAYS @ > Cancer Support Group  2nd Tuesday of the month 1pm-2pm, Journey Room  > Creative Journey  3rd Tuesday of the month 1130am-1pm, Journey Room  > Look Good Feel Better  1st Wednesday of the month 10am-12 noon, Journey Room (Call [place] to register [phone])

## 2016-04-20 NOTE — Progress Notes (Signed)
Pt here today for an Office visit and Xgeva injection. Pt given injection in right lower abdomen. Pt tolerated well. Pt stable and discharged home ambulatory. Pt to return as scheduled.

## 2016-04-21 ENCOUNTER — Telehealth (HOSPITAL_COMMUNITY): Payer: Self-pay

## 2016-04-21 DIAGNOSIS — C50919 Malignant neoplasm of unspecified site of unspecified female breast: Secondary | ICD-10-CM

## 2016-04-21 DIAGNOSIS — C7951 Secondary malignant neoplasm of bone: Secondary | ICD-10-CM

## 2016-04-21 DIAGNOSIS — C210 Malignant neoplasm of anus, unspecified: Secondary | ICD-10-CM

## 2016-04-21 MED ORDER — FENTANYL 12 MCG/HR TD PT72: 12.5000 ug | MEDICATED_PATCH | TRANSDERMAL | 0 refills | Status: DC

## 2016-04-21 NOTE — Telephone Encounter (Signed)
Prescription for Fentanyl  12.5 mcg patch, change q 3 days ready for patient to pickup. She verbalized understanding.

## 2016-04-21 NOTE — Telephone Encounter (Signed)
-----   Message from Louis Meckel sent at 04/21/2016 10:59 AM EDT ----- Regarding: message Patient calling you back and she said she needs a refill on pain patch 12

## 2016-04-22 ENCOUNTER — Other Ambulatory Visit (HOSPITAL_COMMUNITY): Payer: Self-pay | Admitting: Oncology

## 2016-04-22 DIAGNOSIS — C50912 Malignant neoplasm of unspecified site of left female breast: Secondary | ICD-10-CM

## 2016-04-22 MED ORDER — CAPECITABINE 150 MG PO TABS: ORAL_TABLET | ORAL | 0 refills | Status: DC

## 2016-04-22 MED ORDER — CAPECITABINE 500 MG PO TABS: ORAL_TABLET | ORAL | 0 refills | Status: DC

## 2016-04-26 ENCOUNTER — Encounter (HOSPITAL_COMMUNITY): Payer: Self-pay | Admitting: Emergency Medicine

## 2016-04-26 ENCOUNTER — Emergency Department (HOSPITAL_COMMUNITY)
Admission: EM | Admit: 2016-04-26 | Discharge: 2016-04-26 | Disposition: A | Discharge status: Home / Self Care | Payer: BLUE CROSS/BLUE SHIELD | Attending: Emergency Medicine | Admitting: Emergency Medicine

## 2016-04-26 ENCOUNTER — Emergency Department (HOSPITAL_COMMUNITY): Payer: BLUE CROSS/BLUE SHIELD

## 2016-04-26 DIAGNOSIS — R030 Elevated blood-pressure reading, without diagnosis of hypertension: Secondary | ICD-10-CM | POA: Insufficient documentation

## 2016-04-26 DIAGNOSIS — Z79899 Other long term (current) drug therapy: Secondary | ICD-10-CM | POA: Insufficient documentation

## 2016-04-26 DIAGNOSIS — Z853 Personal history of malignant neoplasm of breast: Secondary | ICD-10-CM | POA: Insufficient documentation

## 2016-04-26 DIAGNOSIS — S4991XA Unspecified injury of right shoulder and upper arm, initial encounter: Secondary | ICD-10-CM | POA: Diagnosis present

## 2016-04-26 DIAGNOSIS — S42021A Displaced fracture of shaft of right clavicle, initial encounter for closed fracture: Secondary | ICD-10-CM | POA: Insufficient documentation

## 2016-04-26 DIAGNOSIS — Z85048 Personal history of other malignant neoplasm of rectum, rectosigmoid junction, and anus: Secondary | ICD-10-CM | POA: Insufficient documentation

## 2016-04-26 DIAGNOSIS — Y929 Unspecified place or not applicable: Secondary | ICD-10-CM | POA: Diagnosis not present

## 2016-04-26 DIAGNOSIS — Z85830 Personal history of malignant neoplasm of bone: Secondary | ICD-10-CM | POA: Insufficient documentation

## 2016-04-26 DIAGNOSIS — S42001A Fracture of unspecified part of right clavicle, initial encounter for closed fracture: Secondary | ICD-10-CM

## 2016-04-26 DIAGNOSIS — Y999 Unspecified external cause status: Secondary | ICD-10-CM | POA: Insufficient documentation

## 2016-04-26 DIAGNOSIS — Y9389 Activity, other specified: Secondary | ICD-10-CM | POA: Diagnosis not present

## 2016-04-26 MED ORDER — HYDROCODONE-ACETAMINOPHEN 5-325 MG PO TABS: 1.0000 | ORAL_TABLET | ORAL | 0 refills | Status: DC | PRN

## 2016-04-26 MED ORDER — HYDROCODONE-ACETAMINOPHEN 5-325 MG PO TABS
1.0000 | ORAL_TABLET | Freq: Once | ORAL | Status: AC
  Administered 2016-04-26: 1 via ORAL
  Filled 2016-04-26: qty 1

## 2016-04-26 NOTE — Discharge Instructions (Signed)
You may take the hydrocodone prescribed for pain relief.  This will make you drowsy - do not drive within 4 hours of taking this medication. Wear your sling at all times to help keep your arm and clavicle immobilized.  You may apply ice to the injury site which can help with pain, swelling and bruising.

## 2016-04-26 NOTE — ED Provider Notes (Signed)
Medical screening examination/treatment/procedure(s) were conducted as a shared visit with non-physician practitioner(s) and myself.  I personally evaluated the patient during the encounter.   EKG Interpretation None      Results for orders placed or performed in visit on 04/20/16  CBC with Differential  Result Value Ref Range   WBC 5.2 4.0 - 10.5 K/uL   RBC 3.32 (L) 3.87 - 5.11 MIL/uL   Hemoglobin 11.9 (L) 12.0 - 15.0 g/dL   HCT 36.4 36.0 - 46.0 %   MCV 109.6 (H) 78.0 - 100.0 fL   MCH 35.8 (H) 26.0 - 34.0 pg   MCHC 32.7 30.0 - 36.0 g/dL   RDW 13.6 11.5 - 15.5 %   Platelets 198 150 - 400 K/uL   Neutrophils Relative % 69 %   Neutro Abs 3.5 1.7 - 7.7 K/uL   Lymphocytes Relative 23 %   Lymphs Abs 1.2 0.7 - 4.0 K/uL   Monocytes Relative 8 %   Monocytes Absolute 0.4 0.1 - 1.0 K/uL   Eosinophils Relative 0 %   Eosinophils Absolute 0.0 0.0 - 0.7 K/uL   Basophils Relative 0 %   Basophils Absolute 0.0 0.0 - 0.1 K/uL  Comprehensive metabolic panel  Result Value Ref Range   Sodium 137 135 - 145 mmol/L   Potassium 4.1 3.5 - 5.1 mmol/L   Chloride 102 101 - 111 mmol/L   CO2 26 22 - 32 mmol/L   Glucose, Bld 100 (H) 65 - 99 mg/dL   BUN 15 6 - 20 mg/dL   Creatinine, Ser 1.00 0.44 - 1.00 mg/dL   Calcium 9.4 8.9 - 10.3 mg/dL   Total Protein 6.7 6.5 - 8.1 g/dL   Albumin 4.1 3.5 - 5.0 g/dL   AST 31 15 - 41 U/L   ALT 21 14 - 54 U/L   Alkaline Phosphatase 60 38 - 126 U/L   Total Bilirubin 0.5 0.3 - 1.2 mg/dL   GFR calc non Af Amer 59 (L) >60 mL/min   GFR calc Af Amer >60 >60 mL/min   Anion gap 9 5 - 15   Dg Clavicle Right  Result Date: 04/26/2016 CLINICAL DATA:  Acute right clavicle pain following fall from horse today. Initial encounter. EXAM: RIGHT CLAVICLE - 2+ VIEWS COMPARISON:  04/28/2012 FINDINGS: A comminuted fracture of the mid clavicle with is noted with 1 shaft width inferior displacement. A remote fracture of the right humeral neck identified. A right Port-A-Cath and cervical  spine fusion hardware again noted. IMPRESSION: Comminuted mid clavicle fracture with 1 shaft width inferior displacement. Electronically Signed   By: Margarette Canada M.D.   On: 04/26/2016 14:14   Dg Hand Complete Right  Result Date: 04/26/2016 CLINICAL DATA:  65 year old female with acute right hand pain following fall from horse today. Initial encounter. EXAM: RIGHT HAND - COMPLETE 3+ VIEW COMPARISON:  None. FINDINGS: There is no evidence of fracture or dislocation. There is no evidence of arthropathy or other focal bone abnormality. Soft tissues are unremarkable. IMPRESSION: Negative. Electronically Signed   By: Margarette Canada M.D.   On: 04/26/2016 14:19    Patient seen by me along with the physician assistant. Patient fell off a horse shortly prior to arrival. She did have a helmet on no loss of consciousness. Patient is a experienced worse woman. Patient with complaint of right shoulder clavicle pain and right hand pain. She fell on her right side. Denies any neck pain back pain abdominal pain or trouble breathing. No lower extremity pain.  Patient's x-ray show evidence of a comminuted mid clavicle fracture with displacement. X-ray of the right hand is negative.  On exam patient has obvious deformity to the right clavicle. Right shoulder appears normal. Radial pulse distally is 2+. Right hand without any snuffbox tenderness. No obvious deformity. Lungs are clear bilaterally abdomen is soft and nontender. No lower extremity pain with range of motion. Left arm is normal. Palpation to the posterior aspect of the neck and lower back without any tenderness.  Patient will be treated with a sling for the clavicle fracture and follow-up with orthopedics.   Fredia Sorrow, MD 04/26/16 2134

## 2016-04-26 NOTE — ED Provider Notes (Signed)
[place] DEPT Provider Note   CSN: [id number] Arrival date & time: 04/26/16  1255  By signing my name below, I, Soijett Blue, attest that this documentation has been prepared under the direction and in the presence of Evalee Jefferson, PA-C Electronically Signed: [place], ED Scribe. 04/26/16. 2:48 PM.    History   Chief Complaint Chief Complaint  Patient presents with  . Clavicle Injury    HPI [name] is a 65 y.o. female with a PMHx of stage 4 breast CA, who presents to the Emergency Department complaining of right clavicle injury occurring 1.5 hours ago PTA. Pt notes that she was horseback riding when she was thrown from the horse and landed on the grass onto her right shoulder. Pt states that she had a helmet on during the incident. Pt is having associated symptoms of right hand pain, right hand swelling, bruising to right hand.  She denies head injury. She notes that she has tried a 12 mcg fentanyl patch for the relief of her symptoms. She denies tingling, numbness, LOC, wound, rash, and any other symptoms. Pt states that she is allergic to cipro. Pt reports that she last saw an orthopedist in 2013 for a right humerus fracture that was evaluated by Dr. Luna Glasgow.   The history is provided by the patient. No language interpreter was used.    Past Medical History:  Diagnosis Date  . Adenocarcinoma of breast ([place]) 01/27/2011   stage IIIc breast cancer  . Bladder infection   . Bone metastasis ([place]) 01/17/2016  . Borderline hypertension   . Breast cancer ([place])   . Closed fracture of humerus sept 2013  . Fatigue   . Generalized headaches   . Osteoporosis 01/27/2011  . Pneumonia   . Port catheter in place 04/14/2013  . Post-mastectomy lymphedema syndrome   . 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 Reflux   . Retinal detachment   . 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 Swollen lymph nodes   . URI (upper respiratory  infection) 02/17/12  . Wears glasses     Patient Active Problem List   Diagnosis Date Noted  . Coronary artery calcification seen on CT scan 03/22/2016  . 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 Bone metastasis ([place]) 01/17/2016  . 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 Port catheter in place 04/14/2013  . Adenocarcinoma of left breast 01/27/2011  . Osteoporosis 01/27/2011    Past Surgical History:  Procedure Laterality Date  . biopsy of lymph node     super clavicle  . BREAST SURGERY    . BTL    . CATARACT EXTRACTION    . cervical disc fusioni    . LYMPH NODE BIOPSY Right 06/19/2015   Procedure: RIGHT NECK LYMPH NODE EXCISION;  Surgeon: Rolm Bookbinder, MD;  Location: [facility];  Service: General;  Laterality: Right;  . MASTECTOMY MODIFIED RADICAL    . PORT-A-CATH REMOVAL     in place (not removed)  . retinal detachment and repair      OB History    No data available       Home Medications    Prior to Admission medications   Medication Sig Start Date End Date Taking? Authorizing Provider  atorvastatin (LIPITOR) 10 MG tablet Take 1 tablet (10 mg total) by mouth daily. 02/14/16   Patrici Ranks, MD  calcium-vitamin D (OSCAL WITH D) 500-200 MG-UNIT per tablet Take 2  tablets by mouth daily.     Historical Provider, MD  capecitabine (XELODA) 150 MG tablet Take 1300 mg twice a day 7 days on and 7 days off. 04/22/16   Baird Cancer, PA-C  capecitabine (XELODA) 500 MG tablet Take 1300 mg twice a day 7 days on and 7 days off 04/22/16   Baird Cancer, PA-C  diphenhydrAMINE (SOMINEX) 25 MG tablet Take 25 mg by mouth at bedtime as needed. To help with sleeping    Historical Provider, MD  fentaNYL (DURAGESIC - DOSED MCG/HR) 12 MCG/HR Place 1 patch (12.5 mcg total) onto the skin every 3 (three) days. 04/21/16   Patrici Ranks, MD  HYDROcodone-acetaminophen (NORCO/VICODIN) 5-325 MG tablet Take 1 tablet by mouth every 4 (four) hours as needed. 04/26/16   Evalee Jefferson, PA-C  magnesium oxide (MAG-OX) 400 MG tablet Take 400 mg by mouth 2 (two) times daily.    Historical Provider, MD  metoprolol tartrate (LOPRESSOR) 25 MG tablet Take 25 mg by mouth daily.    Historical Provider, MD  nitrofurantoin (MACRODANTIN) 100 MG capsule Take 100 mg by mouth 2 (two) times daily.    Historical Provider, MD  omeprazole (PRILOSEC) 40 MG capsule Take 1 capsule (40 mg total) by mouth 2 (two) times daily. 03/19/16   Holley Bouche, NP  ondansetron (ZOFRAN) 8 MG tablet Take 8 mg by mouth every 8 (eight) hours as needed for nausea or vomiting.    Historical Provider, MD  traMADol (ULTRAM) 50 MG tablet One to two tablets by mouth every 6 hours as needed for pain 02/14/16   Patrici Ranks, MD  vitamin B-12 (CYANOCOBALAMIN) 1000 MCG tablet Take 1,000 mcg by mouth daily.    Historical Provider, MD    Family History Family History  Problem Relation Age of Onset  . Hypertension Mother   . Heart disease Mother     atrial fib  . Heart disease Father   . Other Father     glaucoma    Social History Social History  Substance Use Topics  . Smoking status: Never Smoker  . Smokeless tobacco: Never Used  . Alcohol use Yes     Comment: not at present time due to chemo treatment     Allergies   Ciprofloxacin   Review of Systems Review of Systems  HENT:       Right sided facial pain  Musculoskeletal: Positive for arthralgias (right clavicle and right hand) and joint swelling (right hand).  Skin: Positive for color change (bruising to right hand). Negative for rash and wound.  Neurological: Negative for syncope.     Physical Exam Updated Vital Signs BP 175/78 (BP Location: Right Wrist)   Pulse 60   Temp 98.7 F (37.1 C) (Oral)   Resp 18   Ht 5\' 4"  (1.626 m)   Wt 62.1 kg   SpO2 100%   BMI 23.52 kg/m   Physical Exam  Constitutional: She appears well-developed and well-nourished.  HENT:  Head: Atraumatic.  Neck: Normal range of motion.  Cardiovascular:    Pulses equal bilaterally  Musculoskeletal: She exhibits tenderness.       Right shoulder: She exhibits deformity.       Right elbow: Normal.      Cervical back: She exhibits no bony tenderness.       Thoracic back: She exhibits tenderness.       Right forearm: Normal.       Right hand: She exhibits swelling. Normal sensation noted.  Right shoulder with palpable deformity, mid right clavicle without skin tinting. No cervical spinal tenderness. Mild right parathoracic tenderness. Bruising and swelling noted to lateral right hand over 5th metacarpal, dorsally. Sensation nl in fingertips with less than 2 second cap refill. Pt able to move all of right fingers without difficulty. Radial pulse full. Right forearm and elbow non tender.   Neurological: She is alert. She has normal strength. She displays normal reflexes. No sensory deficit.  Skin: Skin is warm and dry.  Psychiatric: She has a normal mood and affect.     ED Treatments / Results  DIAGNOSTIC STUDIES: Oxygen Saturation is 100% on RA, nl by my interpretation.    COORDINATION OF CARE: 2:36 PM Discussed treatment plan with pt at bedside which includes right clavicle xray, right hand xray, ice, hydrocodone, referral and follow up with orthopedist, and pt agreed to plan.  Radiology No results found.  Procedures Procedures (including critical care time)  Medications Ordered in ED Medications  HYDROcodone-acetaminophen (NORCO/VICODIN) 5-325 MG per tablet 1 tablet (1 tablet Oral Given 04/26/16 1446)     Initial Impression / Assessment and Plan / ED Course  I have reviewed the triage vital signs and the nursing notes.  Pertinent imaging results that were available during my care of the patient were reviewed by me and considered in my medical decision making (see chart for details).  Clinical Course    Imaging reviewed. Pt placed in sling and swathe.  Ortho f/u. Referral given, pt to call for appt.  Neurovascularly intact.    Final Clinical Impressions(s) / ED Diagnoses   Final diagnoses:  Clavicle fracture, right, closed, initial encounter    New Prescriptions Discharge Medication List as of 04/26/2016  3:06 PM    START taking these medications   Details  HYDROcodone-acetaminophen (NORCO/VICODIN) 5-325 MG tablet Take 1 tablet by mouth every 4 (four) hours as needed., Starting Sun 04/26/2016, Print       I personally performed the services described in this documentation, which was scribed in my presence. The recorded information has been reviewed and is accurate.    Evalee Jefferson, PA-C 05/02/16 [place], DO 05/02/16 1214

## 2016-04-26 NOTE — ED Triage Notes (Signed)
Patient c/o right clavicle pain and right hand pain. Per patient was thrown from horse approx 45 minutes ago. Denies hitting head or LOC. Deformity noted.

## 2016-04-28 ENCOUNTER — Encounter: Payer: Self-pay | Admitting: Orthopaedic Surgery

## 2016-04-28 ENCOUNTER — Ambulatory Visit (INDEPENDENT_AMBULATORY_CARE_PROVIDER_SITE_OTHER): Payer: BLUE CROSS/BLUE SHIELD | Admitting: Orthopaedic Surgery

## 2016-04-28 VITALS — Temp 97.9°F | Ht 64.5 in | Wt 136.0 lb

## 2016-04-28 DIAGNOSIS — S42001A Fracture of unspecified part of right clavicle, initial encounter for closed fracture: Secondary | ICD-10-CM

## 2016-04-28 NOTE — Progress Notes (Signed)
Subjective: I broke my right collar bone    Patient ID: [name], female    DOB: 03-Dec-1950, 65 y.o.   MRN: [id number]  HPI She hurt her right shoulder and clavicle while working with a horse on 04-26-16.  She was seen by Dr. Merlyn Albert.  X-rays were done and showed: IMPRESSION: Comminuted mid clavicle fracture with 1 shaft width inferior Displacement.  She was given a sling and pain medicine.  She has not filled the pain medicine.  She has a history of stage 4 cancer of breast and also colon.  She has a Port-A-Cath in place on the right.  She had no other injury.  Her pain is controlled.   Review of Systems  HENT: Negative for congestion.   Respiratory: Negative for cough and shortness of breath.   Cardiovascular: Negative for chest pain and leg swelling.  Endocrine: Negative for cold intolerance.  Musculoskeletal: Positive for arthralgias, myalgias and neck pain.  Allergic/Immunologic: Negative for environmental allergies.   Past Medical History:  Diagnosis Date  . 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 Bladder infection   . Bone metastasis ([place]) 01/17/2016  . Borderline hypertension   . Breast cancer ([place])   . Closed fracture of humerus sept 2013  . Fatigue   . Generalized headaches   . Osteoporosis 01/27/2011  . Pneumonia   . Port catheter in place 04/14/2013  . Post-mastectomy lymphedema syndrome   . 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 Reflux   . Retinal detachment   . 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 Swollen lymph nodes   . URI (upper respiratory infection) 02/17/12  . Wears glasses     Past Surgical History:  Procedure Laterality Date  . biopsy of lymph node     super clavicle  . BREAST SURGERY    . BTL    . CATARACT EXTRACTION    . cervical disc fusioni    . LYMPH NODE BIOPSY Right 06/19/2015   Procedure: RIGHT NECK LYMPH NODE EXCISION;  Surgeon: Rolm Bookbinder, MD;  Location: [facility];  Service: General;  Laterality: Right;  . MASTECTOMY MODIFIED RADICAL    . PORT-A-CATH REMOVAL     in place (not removed)  . retinal detachment and repair      Current Outpatient Prescriptions on File Prior to Visit  Medication Sig Dispense Refill  . atorvastatin (LIPITOR) 10 MG tablet Take 1 tablet (10 mg total) by mouth daily. 30 tablet 3  . calcium-vitamin D (OSCAL WITH D) 500-200 MG-UNIT per tablet Take 2 tablets by mouth daily.     . capecitabine (XELODA) 150 MG tablet Take 1300 mg twice a day 7 days on and 7 days off. 56 tablet 0  . capecitabine (XELODA) 500 MG tablet Take 1300 mg twice a day 7 days on and 7 days off 56 tablet 0  . diphenhydrAMINE (SOMINEX) 25 MG tablet Take 25 mg by mouth at bedtime as needed. To help with sleeping    . fentaNYL (DURAGESIC - DOSED MCG/HR) 12 MCG/HR Place 1 patch (12.5 mcg total) onto the skin every 3 (three) days. 10 patch 0  . HYDROcodone-acetaminophen (NORCO/VICODIN) 5-325 MG tablet Take 1 tablet by mouth every 4 (four) hours as needed. 20 tablet 0  . magnesium oxide (MAG-OX) 400 MG tablet Take 400 mg by mouth 2 (two) times daily.    . metoprolol  tartrate (LOPRESSOR) 25 MG tablet Take 25 mg by mouth daily.    . nitrofurantoin (MACRODANTIN) 100 MG capsule Take 100 mg by mouth 2 (two) times daily.    Marland Kitchen omeprazole (PRILOSEC) 40 MG capsule Take 1 capsule (40 mg total) by mouth 2 (two) times daily. 180 capsule 4  . ondansetron (ZOFRAN) 8 MG tablet Take 8 mg by mouth every 8 (eight) hours as needed for nausea or vomiting.    . traMADol (ULTRAM) 50 MG tablet One to two tablets by mouth every 6 hours as needed for pain 90 tablet 0  . vitamin B-12 (CYANOCOBALAMIN) 1000 MCG tablet Take 1,000 mcg by mouth daily.     No current facility-administered medications on file prior to visit.     Social History   Social History  . Marital status: Divorced    Spouse name: N/A  . Number of children: N/A  .  Years of education: N/A   Occupational History  . Not on file.   Social History Main Topics  . Smoking status: Never Smoker  . Smokeless tobacco: Never Used  . Alcohol use Yes     Comment: not at present time due to chemo treatment  . Drug use: No  . Sexual activity: Not on file   Other Topics Concern  . Not on file   Social History Narrative  . No narrative on file    Family History  Problem Relation Age of Onset  . Hypertension Mother   . Heart disease Mother     atrial fib  . Heart disease Father   . Other Father     glaucoma    Temp 97.9 F (36.6 C)   Ht 5' 4.5" (1.638 m)   Wt 136 lb (61.7 kg)   BMI 22.98 kg/m      Objective:   Physical Exam  Constitutional: She is oriented to person, place, and time. She appears well-developed and well-nourished.  HENT:  Head: Normocephalic and atraumatic.  Eyes: Conjunctivae and EOM are normal. Pupils are equal, round, and reactive to light.  Neck: Normal range of motion. Neck supple.  Cardiovascular: Normal rate, regular rhythm and intact distal pulses.   Pulmonary/Chest: Effort normal.  Abdominal: Soft.  Musculoskeletal: She exhibits tenderness (Pain and slight deformity of the right clavicle mid shaft, NV intact.  Sling adjusted.  Left negative.  Neck full motion.).  Neurological: She is alert and oriented to person, place, and time. She displays normal reflexes. No cranial nerve deficit. She exhibits normal muscle tone. Coordination normal.  Skin: Skin is warm and dry.  Psychiatric: She has a normal mood and affect. Her behavior is normal. Judgment and thought content normal.          Assessment & Plan:   Encounter Diagnosis  Name Primary?  . Fracture of interligamentous part of right clavicle, closed, initial encounter Yes   A new sling given and adjusted.  Sleep semi-erect.  Return in one week.  X-rays on return.  Call if any problem.  Precautions discussed.  Electronically Signed Sanjuana Kava,  MD 9/19/20172:11 PM

## 2016-05-05 ENCOUNTER — Encounter (HOSPITAL_BASED_OUTPATIENT_CLINIC_OR_DEPARTMENT_OTHER): Payer: BLUE CROSS/BLUE SHIELD

## 2016-05-05 ENCOUNTER — Other Ambulatory Visit (HOSPITAL_COMMUNITY): Payer: BLUE CROSS/BLUE SHIELD

## 2016-05-05 ENCOUNTER — Encounter (HOSPITAL_COMMUNITY): Payer: Self-pay | Admitting: Oncology

## 2016-05-05 ENCOUNTER — Ambulatory Visit (HOSPITAL_COMMUNITY)
Admission: RE | Admit: 2016-05-05 | Discharge: 2016-05-05 | Disposition: A | Discharge status: Home / Self Care | Payer: BLUE CROSS/BLUE SHIELD | Source: Ambulatory Visit | Attending: Oncology | Admitting: Oncology

## 2016-05-05 ENCOUNTER — Ambulatory Visit (INDEPENDENT_AMBULATORY_CARE_PROVIDER_SITE_OTHER): Payer: BLUE CROSS/BLUE SHIELD | Admitting: Orthopaedic Surgery

## 2016-05-05 ENCOUNTER — Encounter (HOSPITAL_BASED_OUTPATIENT_CLINIC_OR_DEPARTMENT_OTHER): Payer: BLUE CROSS/BLUE SHIELD | Admitting: Oncology

## 2016-05-05 ENCOUNTER — Ambulatory Visit (INDEPENDENT_AMBULATORY_CARE_PROVIDER_SITE_OTHER): Payer: BLUE CROSS/BLUE SHIELD

## 2016-05-05 VITALS — HR 60 | Temp 98.0°F | Resp 16 | Ht 64.0 in | Wt 134.0 lb

## 2016-05-05 DIAGNOSIS — M79605 Pain in left leg: Secondary | ICD-10-CM

## 2016-05-05 DIAGNOSIS — C50919 Malignant neoplasm of unspecified site of unspecified female breast: Secondary | ICD-10-CM

## 2016-05-05 DIAGNOSIS — S42001D Fracture of unspecified part of right clavicle, subsequent encounter for fracture with routine healing: Secondary | ICD-10-CM

## 2016-05-05 DIAGNOSIS — C7951 Secondary malignant neoplasm of bone: Secondary | ICD-10-CM | POA: Diagnosis not present

## 2016-05-05 DIAGNOSIS — Z95828 Presence of other vascular implants and grafts: Secondary | ICD-10-CM

## 2016-05-05 DIAGNOSIS — M7989 Other specified soft tissue disorders: Secondary | ICD-10-CM | POA: Insufficient documentation

## 2016-05-05 DIAGNOSIS — C210 Malignant neoplasm of anus, unspecified: Secondary | ICD-10-CM | POA: Diagnosis not present

## 2016-05-05 DIAGNOSIS — T1490 Injury, unspecified: Secondary | ICD-10-CM | POA: Insufficient documentation

## 2016-05-05 DIAGNOSIS — N390 Urinary tract infection, site not specified: Secondary | ICD-10-CM

## 2016-05-05 DIAGNOSIS — Z452 Encounter for adjustment and management of vascular access device: Secondary | ICD-10-CM

## 2016-05-05 LAB — CBC WITH DIFFERENTIAL/PLATELET
Basophils Absolute: 0 10*3/uL (ref 0.0–0.1)
Basophils Relative: 0 %
Eosinophils Absolute: 0.1 10*3/uL (ref 0.0–0.7)
Eosinophils Relative: 1 %
HCT: 35.6 % — ABNORMAL LOW (ref 36.0–46.0)
Hemoglobin: 11.7 g/dL — ABNORMAL LOW (ref 12.0–15.0)
Lymphocytes Relative: 20 %
Lymphs Abs: 1.1 10*3/uL (ref 0.7–4.0)
MCH: 35.6 pg — ABNORMAL HIGH (ref 26.0–34.0)
MCHC: 32.9 g/dL (ref 30.0–36.0)
MCV: 108.2 fL — ABNORMAL HIGH (ref 78.0–100.0)
Monocytes Absolute: 0.5 10*3/uL (ref 0.1–1.0)
Monocytes Relative: 9 %
Neutro Abs: 3.7 10*3/uL (ref 1.7–7.7)
Neutrophils Relative %: 70 %
Platelets: 223 10*3/uL (ref 150–400)
RBC: 3.29 MIL/uL — ABNORMAL LOW (ref 3.87–5.11)
RDW: 13.8 % (ref 11.5–15.5)
WBC: 5.3 10*3/uL (ref 4.0–10.5)

## 2016-05-05 LAB — COMPREHENSIVE METABOLIC PANEL
ALT: 14 U/L (ref 14–54)
AST: 21 U/L (ref 15–41)
Albumin: 4.1 g/dL (ref 3.5–5.0)
Alkaline Phosphatase: 69 U/L (ref 38–126)
Anion gap: 6 (ref 5–15)
BUN: 18 mg/dL (ref 6–20)
CO2: 25 mmol/L (ref 22–32)
Calcium: 8.8 mg/dL — ABNORMAL LOW (ref 8.9–10.3)
Chloride: 105 mmol/L (ref 101–111)
Creatinine, Ser: 0.86 mg/dL (ref 0.44–1.00)
GFR calc Af Amer: >60 mL/min (ref >60)
GFR calc non Af Amer: >60 mL/min (ref >60)
Glucose, Bld: 91 mg/dL (ref 65–99)
Potassium: 3.8 mmol/L (ref 3.5–5.1)
Sodium: 136 mmol/L (ref 135–145)
Total Bilirubin: 0.5 mg/dL (ref 0.3–1.2)
Total Protein: 6.9 g/dL (ref 6.5–8.1)

## 2016-05-05 MED ORDER — HEPARIN SOD (PORK) LOCK FLUSH 100 UNIT/ML IV SOLN
500.0000 [IU] | Freq: Once | INTRAVENOUS | Status: AC
  Administered 2016-05-05: 500 [IU] via INTRAVENOUS

## 2016-05-05 MED ORDER — SODIUM CHLORIDE 0.9% FLUSH
10.0000 mL | INTRAVENOUS | Status: DC | PRN
  Administered 2016-05-05: 10 mL via INTRAVENOUS
  Filled 2016-05-05: qty 10

## 2016-05-05 MED ORDER — NITROFURANTOIN MACROCRYSTAL 100 MG PO CAPS: 100.0000 mg | ORAL_CAPSULE | Freq: Every day | ORAL | 1 refills | Status: DC

## 2016-05-05 MED ORDER — HEPARIN SOD (PORK) LOCK FLUSH 100 UNIT/ML IV SOLN
INTRAVENOUS | Status: AC
Start: 2016-05-05 — End: 2016-05-05
  Filled 2016-05-05: qty 5

## 2016-05-05 NOTE — Progress Notes (Signed)
Wende Neighbors, MD [place] [address]  Adenocarcinoma of breast, unspecified laterality [facility]) - Plan: NM PET Image Restag (PS) Skull Base To Thigh  Squamous cell carcinoma of anus (HCC) - Plan: NM PET Image Restag (PS) Skull Base To Thigh, CBC with Differential, Comprehensive metabolic panel, Cancer antigen 27.29, Cancer antigen 15-3  Bone metastasis (HCC) - Plan: NM PET Image Restag (PS) Skull Base To Thigh  Recurrent UTI - Plan: nitrofurantoin (MACRODANTIN) 100 MG capsule  Left leg pain - Plan: DG Knee 1-2 Views Left, DG Tibia/Fibula Left  CURRENT THERAPY: Xeloda 1300 mg BID 7 days on and 7 days off beginning on 04/20/2016.  INTERVAL HISTORY: [name] 65 y.o. female returns for followup of Stage IV breast cancer ER/PR positive, HER-2/neu negative. BRCA2 POSITIVE. History of stage IIIc left breast cancer in 2009 treated with neoadjuvant chemotherapy consisting of epirubicin and Cytoxan followed by 4 cycles of docetaxel from 04/16/2008-08/13/2008 with the initiation of Herceptin for 52 weeks at the start of docetaxel. She then underwent a left mastectomy on 08/30/2008 showing persistent disease in the breast and lymph nodes. This was followed by radiation therapy from 09/17/2008-11/26/2008 to the left chest wall, supraclavicular, and axillary node areas. She then moved on to antiestrogen therapy with letrozole (11/27/2008-04/20/2011). She was found to have recurrent breast cancer on supraclavicular lymph node biopsy on 03/30/2011 leading to a change in antiestrogen therapy to tamoxifen (04/21/2011-06/04/2014). Pet imaging on 05/25/2014 demonstrated progression of left submandibular lymph node, mediastinal lymph node, and probable lung involvement. This was followed by a biopsy on 06/13/2014 of a left supraclavicular mass that did reveal metastatic carcinoma consistent with breast primary. She was then treated with carboplatin and Taxol for 6 cycles with an  excellent response (06/25/2014-10/09/2014). She was then transition back to hormone therapy consisting of Ibrance and Faslodex (11/13/2014-06/10/2015). This was complicated by grade 3 neutropenia with cycle #2 resulting in a dose reduction of Ibrance to 100 mg for cycle 3. Unfortunately, on 06/10/2015, she was noted to have progression of disease in the right mid cervical lymph node over a 4 day period that was PET avid. Therapy was therefore changed to exemestane and everolimus (95/63/8756-43/32/9518) that was complicated by an adverse reaction consisting of diffuse pneumonitis. This regimen was therefore discontinued. She then transitioned back to systemic chemotherapy consisting of cisplatin every 14 days (08/23/2015-12/23/2015). She was put on a drug holiday beginning on 12/23/2015 in preparation for a trip to [place] on 01/25/2016.  She is now on Xeloda maintenance in a 7 day on and 7 day off fashion. AND newly diagnosed squamous cell carcinoma of anus, HPV POSITIVE, S/P XRT with Xeloda concomitantly in the neoadjuvant setting.     Adenocarcinoma of left breast   04/09/2008 Pathology Results    Positive for breast cancer, ER 100%, PR 5%, HER-2/neu POSITIVE.      04/09/2008 Procedure    Biopsy of left axillary node mass       04/16/2008 - 08/13/2008 Chemotherapy    Dose dense epirubicin/Cytoxan 4 cycles followed by docetaxel 4 cycles. Herceptin initiated with docetaxel for a total of 52 weeks.      08/30/2008 Definitive Surgery    Left mastectomy revealing persistence of disease in the breast and lymph nodes.      09/17/2008 - 11/26/2008 Radiation Therapy    Radiation to left chest wall, supraclavicular, and axillary nodal areas.      11/27/2008 - 04/20/2011 Anti-estrogen oral therapy  Letrozole      03/30/2011 Procedure    Left supraclavicular lymph node biopsy      03/30/2011 Pathology Results    Recurrent breast cancer, invasive ductal type, ER 100%, PR 40%, HER-2 NEGATIVE       04/21/2011 - 06/04/2014 Anti-estrogen oral therapy    Tamoxifen      05/25/2014 PET scan    Progression in left submandibular lymph node, mediastinal lymph nodes, and probable lung involvement.      05/25/2014 Progression    PET scan demonstrates progression of disease.      06/13/2014 Procedure    Left supraclavicular mass biopsy      06/13/2014 Pathology Results    Metastatic carcinoma consistent with breast primary, involving skeletal muscle, perineural invasion, LVI, no lymphoid tissue identified, ER 98%, PR negative, HER-2 negative      06/25/2014 - 10/09/2014 Chemotherapy    Carboplatin/Taxol 6 cycles with excellent response      11/06/2014 - 06/10/2015 Chemotherapy    Palbociclib and Faslodex      01/08/2015 Adverse Reaction    Grade 3 neutropenia with cycle #2 of Palbociclib.      01/08/2015 Treatment Plan Change    Palbociclib dose reduced to 100 mg for cycle #3.      06/10/2015 Progression    Right cervical lymph node progression clinically developed over 4 days.  This lymph node was positive on PET scan in September 2016 and at that time was approximately 8 mm with possible progression in a single bone metastasis.      06/17/2015 Procedure    Biopsy of right cervical lymph node      06/17/2015 Pathology Results    Genetic sequencing performed revealing BRCA2 positivity      07/02/2015 - 08/01/2015 Chemotherapy    Exemestane/everolimus, 25 mg/ 5 mg x 7 days then increasing to 10 mg daily.      08/01/2015 PET scan    Diffuse pneumonitis.        08/01/2015 Adverse Reaction    Pneumonitis secondary to drug reaction from everolimus.      08/23/2015 - 12/23/2015 Chemotherapy    Cisplatin 40 mg/m every other week      11/15/2015 PET scan    Interval resolution of hypermetabolic airspace opacities in the lungs which were likely inflammatory. Abnormal high activity at the anal rectal junction. Very low grade residual activity at the site of L3 metastatic  lesion.      12/23/2015 Treatment Plan Change    Holiday from treatment in preparation for trip to [place].      04/20/2016 -  Chemotherapy    XELODA 1300 mg po bid 7 days on/ 7 days off        Squamous cell carcinoma of anus ([place])   12/31/2015 Procedure    Colonoscopy by Dr. Britta Mccreedy      12/31/2015 Pathology Results    Invasive squamous cell carcinoma approximate 4 cm mass posterior wall of rectum      01/13/2016 Treatment Plan Change    Dr. Leighton Ruff consultation- no surgery.  Recommend chemoXRT      01/16/2016 Pathology Results    HPV POSITIVE      02/07/2016 - 03/10/2016 Chemotherapy    Xeloda 1450 mg PO BID Sunday PM- Friday AM during XRT      02/07/2016 - 03/10/2016 Radiation Therapy    Definitive XRT      02/19/2016 Treatment Plan Change    Xeloda on hold due to increased GERD  symtpoms.  PPI dose increased.  Will restart Xeloda tomorrow, 02/21/2016.      02/23/2016 Treatment Plan Change    Xeloda dose decreased to 1150 mg PO BID Sunday PM- Friday AM during XRT       She recently fractured her right clavicle secondary to a equestrian accident.  She notes that she was bucked off of a horse.  As a result, she fractured her right clavicle.  She also notes a left lower leg pain that has progressed.  She denies any radiation of this pain.  Review of Systems  Constitutional: Negative.  Negative for chills, fever and weight loss.  HENT: Negative.   Eyes: Negative.  Negative for blurred vision.  Respiratory: Negative.  Negative for cough.   Cardiovascular: Negative.  Negative for chest pain.  Gastrointestinal: Negative.  Negative for nausea and vomiting.  Genitourinary: Negative.   Musculoskeletal: Positive for joint pain.  Skin: Negative.   Neurological: Negative.  Negative for weakness.  Endo/Heme/Allergies: Negative.   Psychiatric/Behavioral: Negative.     Past Medical History:  Diagnosis Date  . Adenocarcinoma of breast ([place]) 01/27/2011   stage IIIc breast  cancer  . Bladder infection   . Bone metastasis ([place]) 01/17/2016  . Borderline hypertension   . Breast cancer ([place])   . Closed fracture of humerus sept 2013  . Fatigue   . Generalized headaches   . Osteoporosis 01/27/2011  . Pneumonia   . Port catheter in place 04/14/2013  . Post-mastectomy lymphedema syndrome   . 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 Reflux   . Retinal detachment   . 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 Swollen lymph nodes   . URI (upper respiratory infection) 02/17/12  . Wears glasses     Past Surgical History:  Procedure Laterality Date  . biopsy of lymph node     super clavicle  . BREAST SURGERY    . BTL    . CATARACT EXTRACTION    . cervical disc fusioni    . LYMPH NODE BIOPSY Right 06/19/2015   Procedure: RIGHT NECK LYMPH NODE EXCISION;  Surgeon: Rolm Bookbinder, MD;  Location: [facility];  Service: General;  Laterality: Right;  . MASTECTOMY MODIFIED RADICAL    . PORT-A-CATH REMOVAL     in place (not removed)  . retinal detachment and repair      Family History  Problem Relation Age of Onset  . Hypertension Mother   . Heart disease Mother     atrial fib  . Heart disease Father   . Other Father     glaucoma    Social History   Social History  . Marital status: Divorced    Spouse name: N/A  . Number of children: N/A  . Years of education: N/A   Social History Main Topics  . Smoking status: Never Smoker  . Smokeless tobacco: Never Used  . Alcohol use Yes     Comment: not at present time due to chemo treatment  . Drug use: No  . Sexual activity: Not Asked   Other Topics Concern  . None   Social History Narrative  . None     PHYSICAL EXAMINATION  ECOG PERFORMANCE STATUS: 1 - Symptomatic but completely ambulatory  Vitals:   05/05/16 0850  Pulse: 60  Resp: 16  Temp: 98 F (36.7 C)     GENERAL:alert, no distress, well nourished, well developed, comfortable,  cooperative, smiling and  unaccompanied SKIN: skin color, texture, turgor are normal, no rashes or significant lesions HEAD: Normocephalic, No masses, lesions, tenderness or abnormalities EYES: normal, EOMI, Conjunctiva are pink and non-injected EARS: External ears normal OROPHARYNX:lips, buccal mucosa, and tongue normal and mucous membranes are moist  NECK: supple, trachea midline LYMPH:  no palpable lymphadenopathy BREAST:not examined LUNGS: clear to auscultation and percussion HEART: regular rate & rhythm, no murmurs and no gallops ABDOMEN:abdomen soft, non-tender, normal bowel sounds and no masses or organomegaly BACK: Back symmetric, no curvature. EXTREMITIES:less then 2 second capillary refill, right arm in sling, with left medial bony mass from fracture. NEURO: alert & oriented x 3 with fluent speech, no focal motor/sensory deficits, gait normal   LABORATORY DATA: CBC    Component Value Date/Time   WBC 5.3 05/05/2016 0923   RBC 3.29 (L) 05/05/2016 0923   HGB 11.7 (L) 05/05/2016 0923   HCT 35.6 (L) 05/05/2016 0923   PLT 223 05/05/2016 0923   MCV 108.2 (H) 05/05/2016 0923   MCH 35.6 (H) 05/05/2016 0923   MCHC 32.9 05/05/2016 0923   RDW 13.8 05/05/2016 0923   LYMPHSABS 1.1 05/05/2016 0923   MONOABS 0.5 05/05/2016 0923   EOSABS 0.1 05/05/2016 0923   BASOSABS 0.0 05/05/2016 0923      Chemistry      Component Value Date/Time   NA 136 05/05/2016 0923   K 3.8 05/05/2016 0923   CL 105 05/05/2016 0923   CO2 25 05/05/2016 0923   BUN 18 05/05/2016 0923   CREATININE 0.86 05/05/2016 0923      Component Value Date/Time   CALCIUM 8.8 (L) 05/05/2016 0923   ALKPHOS 69 05/05/2016 0923   AST 21 05/05/2016 0923   ALT 14 05/05/2016 0923   BILITOT 0.5 05/05/2016 0923        PENDING LABS:   RADIOGRAPHIC STUDIES:  Dg Clavicle Right  Result Date: 05/05/2016 Clinical:  History of right clavicle fracture X-rays were done of the right clavicle, two views There is a fracture  displaced in bayonet positioning of the mid shaft of the right clavicle. Impression:  Fracture right clavicle midshaft displaced Electronically Signed Sanjuana Kava, MD 9/26/20171:58 PM   Dg Clavicle Right  Result Date: 04/26/2016 CLINICAL DATA:  Acute right clavicle pain following fall from horse today. Initial encounter. EXAM: RIGHT CLAVICLE - 2+ VIEWS COMPARISON:  04/28/2012 FINDINGS: A comminuted fracture of the mid clavicle with is noted with 1 shaft width inferior displacement. A remote fracture of the right humeral neck identified. A right Port-A-Cath and cervical spine fusion hardware again noted. IMPRESSION: Comminuted mid clavicle fracture with 1 shaft width inferior displacement. Electronically Signed   By: Margarette Canada M.D.   On: 04/26/2016 14:14   Dg Knee 1-2 Views Left  Result Date: 05/05/2016 CLINICAL DATA:  Left knee pain after falling off horse 10 days ago EXAM: LEFT KNEE - 1-2 VIEW COMPARISON:  None. FINDINGS: Two views of the left knee submitted. No acute fracture or subluxation. No radiopaque foreign body. No joint effusion. Joint space is preserved. IMPRESSION: Negative. Electronically Signed   By: Lahoma Crocker M.D.   On: 05/05/2016 10:52   Dg Tibia/fibula Left  Result Date: 05/05/2016 CLINICAL DATA:  Status post fall from a horse 10 days ago with persistent proximal tibia and fibular pain just below the knee EXAM: LEFT TIBIA AND FIBULA - 2 VIEW COMPARISON:  None in PACs FINDINGS: The bones are subjectively adequately mineralized. The observed portions of the knee and ankle exhibit no acute  abnormalities. There is subjective narrowing of the ankle joint mortise. The fibula is intact. There is no periosteal reaction. The soft tissues of the proximal pretibial region are mildly swollen. IMPRESSION: No acute or significant chronic bony abnormality of the left tibia or fibula is observed. The pretibial soft tissues exhibit mild swelling proximally which may reflect contusion. Electronically  Signed   By: David  Martinique M.D.   On: 05/05/2016 10:58   Dg Hand Complete Right  Result Date: 04/26/2016 CLINICAL DATA:  65 year old female with acute right hand pain following fall from horse today. Initial encounter. EXAM: RIGHT HAND - COMPLETE 3+ VIEW COMPARISON:  None. FINDINGS: There is no evidence of fracture or dislocation. There is no evidence of arthropathy or other focal bone abnormality. Soft tissues are unremarkable. IMPRESSION: Negative. Electronically Signed   By: Margarette Canada M.D.   On: 04/26/2016 14:19     PATHOLOGY:    ASSESSMENT AND PLAN:  Adenocarcinoma of left breast Stage IV breast cancer ER/PR positive, HER-2/neu negative. BRCA2 POSITIVE.  History of stage IIIc left breast cancer in 2009 treated with neoadjuvant chemotherapy consisting of epirubicin and Cytoxan followed by 4 cycles of docetaxel from 04/16/2008-08/13/2008 with the initiation of Herceptin for 52 weeks at the start of docetaxel. She then underwent a left mastectomy on 08/30/2008 showing persistent disease in the breast and lymph nodes. This was followed by radiation therapy from 09/17/2008-11/26/2008 to the left chest wall, supraclavicular, and axillary node areas. She then moved on to antiestrogen therapy with letrozole (11/27/2008-04/20/2011). She was found to have recurrent breast cancer on supraclavicular lymph node biopsy on 03/30/2011 leading to a change in antiestrogen therapy to tamoxifen (04/21/2011-06/04/2014). Pet imaging on 05/25/2014 demonstrated progression of left submandibular lymph node, mediastinal lymph node, and probable lung involvement. This was followed by a biopsy on 06/13/2014 of a left supraclavicular mass that did reveal metastatic carcinoma consistent with breast primary. She was then treated with carboplatin and Taxol for 6 cycles with an excellent response (06/25/2014-10/09/2014). She was then transition back to hormone therapy consisting of Ibrance and Faslodex (11/13/2014-06/10/2015).  This was complicated by grade 3 neutropenia with cycle #2 resulting in a dose reduction of Ibrance to 100 mg for cycle 3. Unfortunately, on 06/10/2015, she was noted to have progression of disease in the right mid cervical lymph node over a 4 day period that was PET avid. Therapy was therefore changed to exemestane and everolimus (78/29/5621-30/86/5784) that was complicated by an adverse reaction consisting of diffuse pneumonitis. This regimen was therefore discontinued. She then transitioned back to systemic chemotherapy consisting of cisplatin every 14 days (08/23/2015-12/23/2015). She was put on a drug holiday beginning on 12/23/2015 in preparation for a trip to [place] on 01/25/2016.  She is now on Xeloda maintenance in a 7 day on and 7 day off fashion beginning on 04/20/2016.  Oncology history is updated.  She has seen Dr. Mauro Kaufmann Muss in the past for second opinion.  Labs today: CBC diff, CMET, CA 27.29, CA 15-3.  I personally reviewed and went over laboratory results with the patient.  The results are noted within this dictation.  I have refilled her Macrobid.  She takes this daily prophylactically for recurrent UTIs.  She continues to taper her Fentanyl dosing and she plans on stopping in ~ 2 weeks (after her clavicle heals more).  She will continue with short-acting medication as needed.  She started her Xeloda on 05/04/2016.  She has follow-up with Dr. Luna Glasgow later today for her right clavicle fracture.  She notes progressive left leg pain and therefore, I have order left knee and left tib/fib xray.  Xrays are negative for any acute fracture, but there is evidence of contusion.   Restaging scans ordered: PET scan.  PET is being chosen due to metastatic breast cancer in addition to evaluation of response to anal cancer knowing that CT imaging has not been helpful from an anal cancer standpoint.  Return in 2 weeks for follow-up, labs, and Xgeva.   Squamous cell carcinoma of anus  (HCC) Squamous cell carcinoma of anus, HPV POSITIVE, S/P XRT with Xeloda concomitantly in the neoadjuvant setting.  Initial medical oncology recommendations provided by Dr. Tressie Stalker at [facility].  Dr. Tressie Stalker does dictate that there is a history of anal intercourse in the remote past.  Oncology history updated.  She is on Fentanyl 12.5 mcg with breakthrough pain management.  She is currently working on a taper of her pain medications which is reasonable.  Bone metastasis (HCC) Bone metastases from breast cancer. On Xgeva therapy. She is also taking calcium and vitamin D.  Oncology Flowsheet 04/20/2016  denosumab (XGEVA) [place] 120 mg    ORDERS PLACED FOR THIS ENCOUNTER: Orders Placed This Encounter  Procedures  . NM PET Image Restag (PS) Skull Base To Thigh  . DG Knee 1-2 Views Left  . DG Tibia/Fibula Left    MEDICATIONS PRESCRIBED THIS ENCOUNTER: Meds ordered this encounter  Medications  . escitalopram (LEXAPRO) 10 MG tablet    Refill:  5  . DISCONTD: fentaNYL (DURAGESIC - DOSED MCG/HR) 25 MCG/HR patch    Sig: PLACE 1 PATCH ONTO THE SKIN Q 3 DAYS    Refill:  0  . metoprolol succinate (TOPROL-XL) 25 MG 24 hr tablet    Sig: TK 1 T QD    Refill:  5  . omeprazole (PRILOSEC) 20 MG capsule    Refill:  6  . nitrofurantoin (MACRODANTIN) 100 MG capsule    Sig: Take 1 capsule (100 mg total) by mouth daily.    Dispense:  90 capsule    Refill:  1    Order Specific Question:   Supervising Provider    Answer:   Patrici Ranks [id number]    THERAPY PLAN:  Continue Xeloda maintenance therapy with upcoming restaging tests ordered.  All questions were answered. The patient knows to call the clinic with any problems, questions or concerns. We can certainly see the patient much sooner if necessary.  Patient and plan discussed with Dr. Ancil Linsey and she is in agreement with the aforementioned.   This note is electronically signed by: Doy Mince 05/05/2016 5:39 PM

## 2016-05-05 NOTE — Progress Notes (Signed)
[name] presented for Portacath access and flush. Portacath located right chest wall accessed with  H 20 needle. Good blood return present. Portacath flushed with 71ml NS and 500U/77ml Heparin and needle removed intact. Procedure without incident. Patient tolerated procedure well.  Labs were also drawn and sent to lab.

## 2016-05-05 NOTE — Assessment & Plan Note (Addendum)
Squamous cell carcinoma of anus, HPV POSITIVE, S/P XRT with Xeloda concomitantly in the neoadjuvant setting.  Initial medical oncology recommendations provided by Dr. Tressie Stalker at [facility].  Dr. Tressie Stalker does dictate that there is a history of anal intercourse in the remote past.  Oncology history updated.  She is on Fentanyl 12.5 mcg with breakthrough pain management.  She is currently working on a taper of her pain medications which is reasonable.

## 2016-05-05 NOTE — Assessment & Plan Note (Addendum)
Stage IV breast cancer ER/PR positive, HER-2/neu negative. BRCA2 POSITIVE.  History of stage IIIc left breast cancer in 2009 treated with neoadjuvant chemotherapy consisting of epirubicin and Cytoxan followed by 4 cycles of docetaxel from 04/16/2008-08/13/2008 with the initiation of Herceptin for 52 weeks at the start of docetaxel. She then underwent a left mastectomy on 08/30/2008 showing persistent disease in the breast and lymph nodes. This was followed by radiation therapy from 09/17/2008-11/26/2008 to the left chest wall, supraclavicular, and axillary node areas. She then moved on to antiestrogen therapy with letrozole (11/27/2008-04/20/2011). She was found to have recurrent breast cancer on supraclavicular lymph node biopsy on 03/30/2011 leading to a change in antiestrogen therapy to tamoxifen (04/21/2011-06/04/2014). Pet imaging on 05/25/2014 demonstrated progression of left submandibular lymph node, mediastinal lymph node, and probable lung involvement. This was followed by a biopsy on 06/13/2014 of a left supraclavicular mass that did reveal metastatic carcinoma consistent with breast primary. She was then treated with carboplatin and Taxol for 6 cycles with an excellent response (06/25/2014-10/09/2014). She was then transition back to hormone therapy consisting of Ibrance and Faslodex (11/13/2014-06/10/2015). This was complicated by grade 3 neutropenia with cycle #2 resulting in a dose reduction of Ibrance to 100 mg for cycle 3. Unfortunately, on 06/10/2015, she was noted to have progression of disease in the right mid cervical lymph node over a 4 day period that was PET avid. Therapy was therefore changed to exemestane and everolimus (80/10/4915-91/50/5697) that was complicated by an adverse reaction consisting of diffuse pneumonitis. This regimen was therefore discontinued. She then transitioned back to systemic chemotherapy consisting of cisplatin every 14 days (08/23/2015-12/23/2015). She was put on a  drug holiday beginning on 12/23/2015 in preparation for a trip to [place] on 01/25/2016.  She is now on Xeloda maintenance in a 7 day on and 7 day off fashion beginning on 04/20/2016.  Oncology history is updated.  She has seen Dr. Mauro Kaufmann Muss in the past for second opinion.  Labs today: CBC diff, CMET, CA 27.29, CA 15-3.  I personally reviewed and went over laboratory results with the patient.  The results are noted within this dictation.  I have refilled her Macrobid.  She takes this daily prophylactically for recurrent UTIs.  She continues to taper her Fentanyl dosing and she plans on stopping in ~ 2 weeks (after her clavicle heals more).  She will continue with short-acting medication as needed.  She started her Xeloda on 05/04/2016.  She has follow-up with Dr. Luna Glasgow later today for her right clavicle fracture.    She notes progressive left leg pain and therefore, I have order left knee and left tib/fib xray.  Xrays are negative for any acute fracture, but there is evidence of contusion.   Restaging scans ordered: PET scan.  PET is being chosen due to metastatic breast cancer in addition to evaluation of response to anal cancer knowing that CT imaging has not been helpful from an anal cancer standpoint.  Return in 2 weeks for follow-up, labs, and Xgeva.

## 2016-05-05 NOTE — Assessment & Plan Note (Signed)
Bone metastases from breast cancer. On Xgeva therapy. She is also taking calcium and vitamin D.  Oncology Flowsheet 04/20/2016  denosumab (XGEVA) [place] 120 mg

## 2016-05-05 NOTE — Progress Notes (Signed)
[name] tolerated port lab draw well without complaints or incident. Port flushed per protocol after blood drawn. Pt discharged self ambulatory in satisfactory condition

## 2016-05-05 NOTE — Patient Instructions (Signed)
[place] at [facility] Discharge Instructions  RECOMMENDATIONS MADE BY THE CONSULTANT AND ANY TEST RESULTS WILL BE SENT TO YOUR REFERRING PHYSICIAN.  You were seen today by Kirby Crigler PA-C. You had labs and a port flush today.  Return in 2 weeks for labs, follow up, PET scan and Xgeva.   Thank you for choosing [place] at [facility] to provide your oncology and hematology care.  To afford each patient quality time with our provider, please arrive at least 15 minutes before your scheduled appointment time.   Beginning January 23rd 2017 lab work for the [facility] will be done in the  Main lab at [facility] on 1st floor. If you have a lab appointment with the [facility] please come in thru the  Main Entrance and check in at the main information desk  You need to re-schedule your appointment should you arrive 10 or more minutes late.  We strive to give you quality time with our providers, and arriving late affects you and other patients whose appointments are after yours.  Also, if you no show three or more times for appointments you may be dismissed from the clinic at the providers discretion.     Again, thank you for choosing [facility].  Our hope is that these requests will decrease the amount of time that you wait before being seen by our physicians.       _____________________________________________________________  Should you have questions after your visit to [facility], please contact our office at [phone] between the hours of 8:30 a.m. and 4:30 p.m.  Voicemails left after 4:30 p.m. will not be returned until the following business day.  For prescription refill requests, have your pharmacy contact our office.         Resources For Cancer Patients and their Caregivers ? American Cancer Society: Can assist with transportation, wigs, general needs, runs Look Good Feel Better.         [phone] ? Cancer Care: Provides financial assistance, online support groups, medication/co-pay assistance.  1-800-813-HOPE 947 260 2800) ? [place] Assists [place] cancer patients and their families through emotional , educational and financial support.  (772)268-3187 ? [place] DSS Where to apply for food stamps, Medicaid and utility assistance. (984) 875-2160 ? RCATS: Transportation to medical appointments. (571) 838-8270 ? Social Security Administration: May apply for disability if have a Stage IV cancer. 812 637 0518 604-849-5553 ? [employer], Disability and Transit Services: Assists with nutrition, care and transit needs. [place] Support Programs: @10RELATIVEDAYS @ > Cancer Support Group  2nd Tuesday of the month 1pm-2pm, Journey Room  > Creative Journey  3rd Tuesday of the month 1130am-1pm, Journey Room  > Look Good Feel Better  1st Wednesday of the month 10am-12 noon, Journey Room (Call [place] to register [phone])

## 2016-05-05 NOTE — Progress Notes (Signed)
CC:  My collar bone is less tender  She has less pain of the right clavicle. She is using her sling.  NV is intact. She has a prominence of the clavicle on the right midshaft.  X-rays were done and reported separately.  Encounter Diagnosis  Name Primary?  . Clavicle fracture, right, with routine healing, subsequent encounter Yes   Return in three weeks.  X-rays on return.  Precautions given.  Call if any problem.  Electronically Signed Sanjuana Kava, MD 9/26/20172:00 PM

## 2016-05-05 NOTE — Patient Instructions (Signed)
[place] at [facility] Discharge Instructions  RECOMMENDATIONS MADE BY THE CONSULTANT AND ANY TEST RESULTS WILL BE SENT TO YOUR REFERRING PHYSICIAN. Port lab draw with flush today. Follow-up as scheduled. Call clinic for any questions or concerns  Thank you for choosing [place] at [facility] to provide your oncology and hematology care.  To afford each patient quality time with our provider, please arrive at least 15 minutes before your scheduled appointment time.   Beginning January 23rd 2017 lab work for the [facility] will be done in the  Main lab at [facility] on 1st floor. If you have a lab appointment with the [place] please come in thru the  Main Entrance and check in at the main information desk  You need to re-schedule your appointment should you arrive 10 or more minutes late.  We strive to give you quality time with our providers, and arriving late affects you and other patients whose appointments are after yours.  Also, if you no show three or more times for appointments you may be dismissed from the clinic at the providers discretion.     Again, thank you for choosing [facility].  Our hope is that these requests will decrease the amount of time that you wait before being seen by our physicians.       _____________________________________________________________  Should you have questions after your visit to [facility], please contact our office at [phone] between the hours of 8:30 a.m. and 4:30 p.m.  Voicemails left after 4:30 p.m. will not be returned until the following business day.  For prescription refill requests, have your pharmacy contact our office.         Resources For Cancer Patients and their Caregivers ? American Cancer Society: Can assist with transportation, wigs, general needs, runs Look Good Feel Better.        (669)381-6647 ? Cancer Care: Provides financial  assistance, online support groups, medication/co-pay assistance.  1-800-813-HOPE 930 371 9654) ? [place] Assists [place] cancer patients and their families through emotional , educational and financial support.  (252)101-3183 ? [place] DSS Where to apply for food stamps, Medicaid and utility assistance. 225-048-5419 ? RCATS: Transportation to medical appointments. (458)165-9899 ? Social Security Administration: May apply for disability if have a Stage IV cancer. (224) [phone] ? [employer], Disability and Transit Services: Assists with nutrition, care and transit needs. [place] Support Programs: @10RELATIVEDAYS @ > Cancer Support Group  2nd Tuesday of the month 1pm-2pm, Journey Room  > Creative Journey  3rd Tuesday of the month 1130am-1pm, Journey Room  > Look Good Feel Better  1st Wednesday of the month 10am-12 noon, Journey Room (Call [place] to register [phone])

## 2016-05-06 LAB — CANCER ANTIGEN 27.29: CA 27.29: 23.2 U/mL (ref 0.0–38.6)

## 2016-05-06 LAB — CANCER ANTIGEN 15-3: CA 15-3: 21.9 U/mL (ref 0.0–25.0)

## 2016-05-13 ENCOUNTER — Telehealth (HOSPITAL_COMMUNITY): Payer: Self-pay | Admitting: Oncology

## 2016-05-13 NOTE — Telephone Encounter (Signed)
Peer to peer completed for PET approval.  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  As a result, PET is needed for restaging/surveillance.  I spoke with Dr. Jack Quarto who approved PET imaging.  Approval #: [id number].  Robynn Pane, PA-C 05/13/2016 4:19 PM

## 2016-05-15 ENCOUNTER — Ambulatory Visit (HOSPITAL_COMMUNITY)
Admission: RE | Admit: 2016-05-15 | Discharge: 2016-05-15 | Disposition: A | Discharge status: Home / Self Care | Payer: BLUE CROSS/BLUE SHIELD | Source: Ambulatory Visit | Attending: Oncology | Admitting: Oncology

## 2016-05-15 DIAGNOSIS — X58XXXA Exposure to other specified factors, initial encounter: Secondary | ICD-10-CM | POA: Insufficient documentation

## 2016-05-15 DIAGNOSIS — C50919 Malignant neoplasm of unspecified site of unspecified female breast: Secondary | ICD-10-CM | POA: Diagnosis present

## 2016-05-15 DIAGNOSIS — C7951 Secondary malignant neoplasm of bone: Secondary | ICD-10-CM | POA: Diagnosis not present

## 2016-05-15 DIAGNOSIS — I2510 Atherosclerotic heart disease of native coronary artery without angina pectoris: Secondary | ICD-10-CM | POA: Insufficient documentation

## 2016-05-15 DIAGNOSIS — I700 Atherosclerosis of aorta: Secondary | ICD-10-CM | POA: Diagnosis not present

## 2016-05-15 DIAGNOSIS — C210 Malignant neoplasm of anus, unspecified: Secondary | ICD-10-CM | POA: Diagnosis not present

## 2016-05-15 DIAGNOSIS — S2241XA Multiple fractures of ribs, right side, initial encounter for closed fracture: Secondary | ICD-10-CM | POA: Diagnosis not present

## 2016-05-15 LAB — GLUCOSE, CAPILLARY: Glucose-Capillary: 107 mg/dL — ABNORMAL HIGH (ref 65–99)

## 2016-05-15 MED ORDER — FLUDEOXYGLUCOSE F - 18 (FDG) INJECTION
6.6700 | Freq: Once | INTRAVENOUS | Status: AC | PRN
  Administered 2016-05-15: 6.67 via INTRAVENOUS

## 2016-05-18 ENCOUNTER — Other Ambulatory Visit (HOSPITAL_COMMUNITY): Payer: BLUE CROSS/BLUE SHIELD

## 2016-05-18 ENCOUNTER — Ambulatory Visit (HOSPITAL_COMMUNITY): Payer: BLUE CROSS/BLUE SHIELD

## 2016-05-19 ENCOUNTER — Encounter (HOSPITAL_COMMUNITY): Payer: BLUE CROSS/BLUE SHIELD | Attending: Hematology & Oncology

## 2016-05-19 ENCOUNTER — Encounter (HOSPITAL_COMMUNITY): Payer: Self-pay | Admitting: Oncology

## 2016-05-19 ENCOUNTER — Encounter (HOSPITAL_COMMUNITY): Payer: BLUE CROSS/BLUE SHIELD | Attending: Oncology | Admitting: Oncology

## 2016-05-19 ENCOUNTER — Encounter (HOSPITAL_COMMUNITY): Payer: BLUE CROSS/BLUE SHIELD

## 2016-05-19 DIAGNOSIS — C7951 Secondary malignant neoplasm of bone: Secondary | ICD-10-CM | POA: Insufficient documentation

## 2016-05-19 DIAGNOSIS — C50919 Malignant neoplasm of unspecified site of unspecified female breast: Secondary | ICD-10-CM | POA: Diagnosis not present

## 2016-05-19 DIAGNOSIS — C210 Malignant neoplasm of anus, unspecified: Secondary | ICD-10-CM

## 2016-05-19 LAB — CBC WITH DIFFERENTIAL/PLATELET
Basophils Absolute: 0 10*3/uL (ref 0.0–0.1)
Basophils Relative: 0 %
Eosinophils Absolute: 0.1 10*3/uL (ref 0.0–0.7)
Eosinophils Relative: 1 %
HCT: 35.9 % — ABNORMAL LOW (ref 36.0–46.0)
Hemoglobin: 11.9 g/dL — ABNORMAL LOW (ref 12.0–15.0)
Lymphocytes Relative: 25 %
Lymphs Abs: 1.3 10*3/uL (ref 0.7–4.0)
MCH: 35.7 pg — ABNORMAL HIGH (ref 26.0–34.0)
MCHC: 33.1 g/dL (ref 30.0–36.0)
MCV: 107.8 fL — ABNORMAL HIGH (ref 78.0–100.0)
Monocytes Absolute: 0.4 10*3/uL (ref 0.1–1.0)
Monocytes Relative: 9 %
Neutro Abs: 3.4 10*3/uL (ref 1.7–7.7)
Neutrophils Relative %: 65 %
Platelets: 170 10*3/uL (ref 150–400)
RBC: 3.33 MIL/uL — ABNORMAL LOW (ref 3.87–5.11)
RDW: 14.6 % (ref 11.5–15.5)
WBC: 5.2 10*3/uL (ref 4.0–10.5)

## 2016-05-19 LAB — COMPREHENSIVE METABOLIC PANEL
ALT: 15 U/L (ref 14–54)
AST: 24 U/L (ref 15–41)
Albumin: 4 g/dL (ref 3.5–5.0)
Alkaline Phosphatase: 100 U/L (ref 38–126)
Anion gap: 4 — ABNORMAL LOW (ref 5–15)
BUN: 15 mg/dL (ref 6–20)
CO2: 23 mmol/L (ref 22–32)
Calcium: 8.7 mg/dL — ABNORMAL LOW (ref 8.9–10.3)
Chloride: 109 mmol/L (ref 101–111)
Creatinine, Ser: 0.93 mg/dL (ref 0.44–1.00)
GFR calc Af Amer: >60 mL/min (ref >60)
GFR calc non Af Amer: >60 mL/min (ref >60)
Glucose, Bld: 103 mg/dL — ABNORMAL HIGH (ref 65–99)
Potassium: 4 mmol/L (ref 3.5–5.1)
Sodium: 136 mmol/L (ref 135–145)
Total Bilirubin: 0.5 mg/dL (ref 0.3–1.2)
Total Protein: 6.7 g/dL (ref 6.5–8.1)

## 2016-05-19 NOTE — Assessment & Plan Note (Addendum)
Stage IV breast cancer ER/PR positive, HER-2/neu negative. BRCA2 POSITIVE.  History of stage IIIc left breast cancer in 2009 treated with neoadjuvant chemotherapy consisting of epirubicin and Cytoxan followed by 4 cycles of docetaxel from 04/16/2008-08/13/2008 with the initiation of Herceptin for 52 weeks at the start of docetaxel. She then underwent a left mastectomy on 08/30/2008 showing persistent disease in the breast and lymph nodes. This was followed by radiation therapy from 09/17/2008-11/26/2008 to the left chest wall, supraclavicular, and axillary node areas. She then moved on to antiestrogen therapy with letrozole (11/27/2008-04/20/2011). She was found to have recurrent breast cancer on supraclavicular lymph node biopsy on 03/30/2011 leading to a change in antiestrogen therapy to tamoxifen (04/21/2011-06/04/2014). Pet imaging on 05/25/2014 demonstrated progression of left submandibular lymph node, mediastinal lymph node, and probable lung involvement. This was followed by a biopsy on 06/13/2014 of a left supraclavicular mass that did reveal metastatic carcinoma consistent with breast primary. She was then treated with carboplatin and Taxol for 6 cycles with an excellent response (06/25/2014-10/09/2014). She was then transition back to hormone therapy consisting of Ibrance and Faslodex (11/13/2014-06/10/2015). This was complicated by grade 3 neutropenia with cycle #2 resulting in a dose reduction of Ibrance to 100 mg for cycle 3. Unfortunately, on 06/10/2015, she was noted to have progression of disease in the right mid cervical lymph node over a 4 day period that was PET avid. Therapy was therefore changed to exemestane and everolimus (72/94/2627-00/48/4986) that was complicated by an adverse reaction consisting of diffuse pneumonitis. This regimen was therefore discontinued. She then transitioned back to systemic chemotherapy consisting of cisplatin every 14 days (08/23/2015-12/23/2015). She was put on a  drug holiday beginning on 12/23/2015 in preparation for a trip to [place] on 01/25/2016.  She is now on Xeloda maintenance in a 7 day on and 7 day off fashion beginning on 04/20/2016.  Oncology history is updated.  She has seen Dr. Mauro Kaufmann Muss in the past for second opinion.  Labs today: CBC diff, CMET.  I personally reviewed and went over laboratory results with the patient.  The results are noted within this dictation.  She has successfully tapered off of Fentanyl.  Pain is currently well controled.  She started her Xeloda today, 05/19/2016.  Right clavicular fracture is followed by Dr. Luna Glasgow.  Return in 3-4 weeks for follow-up, labs, and Xgeva.

## 2016-05-19 NOTE — Patient Instructions (Signed)
[place] at [facility] Discharge Instructions  RECOMMENDATIONS MADE BY THE CONSULTANT AND ANY TEST RESULTS WILL BE SENT TO YOUR REFERRING PHYSICIAN.  You were seen today by Kirby Crigler PA-C. Xgeva held today. Continue taking calcium and vit D. Take tums 2-3 times a day. Follow up ,labs, port flush and Xgeva in 4 weeks.   Thank you for choosing [place] at [facility] to provide your oncology and hematology care.  To afford each patient quality time with our provider, please arrive at least 15 minutes before your scheduled appointment time.   Beginning January 23rd 2017 lab work for the [facility] will be done in the  Main lab at [facility] on 1st floor. If you have a lab appointment with the [facility] please come in thru the  Main Entrance and check in at the main information desk  You need to re-schedule your appointment should you arrive 10 or more minutes late.  We strive to give you quality time with our providers, and arriving late affects you and other patients whose appointments are after yours.  Also, if you no show three or more times for appointments you may be dismissed from the clinic at the providers discretion.     Again, thank you for choosing [facility].  Our hope is that these requests will decrease the amount of time that you wait before being seen by our physicians.       _____________________________________________________________  Should you have questions after your visit to [facility], please contact our office at [phone] between the hours of 8:30 a.m. and 4:30 p.m.  Voicemails left after 4:30 p.m. will not be returned until the following business day.  For prescription refill requests, have your pharmacy contact our office.         Resources For Cancer Patients and their Caregivers ? American Cancer Society: Can assist with transportation, wigs, general needs, runs  Look Good Feel Better.        ([phone] ? Cancer Care: Provides financial assistance, online support groups, medication/co-pay assistance.  1-800-813-HOPE (936) 569-8781) ? [place] Assists [place] cancer patients and their families through emotional , educational and financial support.  928-205-6724 ? [place] DSS Where to apply for food stamps, Medicaid and utility assistance. 351-003-7015 ? RCATS: Transportation to medical appointments. 248-234-0357 ? Social Security Administration: May apply for disability if have a Stage IV cancer. ([phone] 602-003-5163 ? [employer], Disability and Transit Services: Assists with nutrition, care and transit needs. [place] Support Programs: @10RELATIVEDAYS @ > Cancer Support Group  2nd Tuesday of the month 1pm-2pm, Journey Room  > Creative Journey  3rd Tuesday of the month 1130am-1pm, Journey Room  > Look Good Feel Better  1st Wednesday of the month 10am-12 noon, Journey Room (Call [place] to register [phone])

## 2016-05-19 NOTE — Assessment & Plan Note (Addendum)
Squamous cell carcinoma of anus, HPV POSITIVE, S/P XRT with Xeloda concomitantly in the neoadjuvant setting.  Initial medical oncology recommendations provided by Dr. Tressie Stalker at [facility].  Dr. Tressie Stalker does dictate that there is a history of anal intercourse in the remote past.  Oncology history updated.  I personally reviewed and went over radiographic studies with the patient.  The results are noted within this dictation.  PET demonstrates a partial metabolic response regarding anal malignancy.  Patient is educated that a partial response at this time does not necessarily mean failure of treatment.  It is not uncommon to continue to see regression of disease even up to 26 weeks post treatment according to ACT-II study.  I will message Dr. Leighton Ruff for ongoing follow-up of anal cancer.  At follow-up appointment, will plan for further PET restaging.

## 2016-05-19 NOTE — Assessment & Plan Note (Addendum)
Bone metastases from breast cancer. On Xgeva therapy. She is also taking calcium and vitamin D.  Xgeva due today BUT HELD due to hypocalcemia.  Will defer x 3-4 weeks.    Compliance with Ca++ encouraged.  I have also recommended TUM BID-TID for additional Ca++ supplementation.

## 2016-05-19 NOTE — Progress Notes (Signed)
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  INTERVAL HISTORY: [name] 65 y.o. female returns for followup of Stage IV breast cancer ER/PR positive, HER-2/neu negative. BRCA2 POSITIVE. History of stage IIIc left breast cancer in 2009 treated with neoadjuvant chemotherapy consisting of epirubicin and Cytoxan followed by 4 cycles of docetaxel from 04/16/2008-08/13/2008 with the initiation of Herceptin for 52 weeks at the start of docetaxel. She then underwent a left mastectomy on 08/30/2008 showing persistent disease in the breast and lymph nodes. This was followed by radiation therapy from 09/17/2008-11/26/2008 to the left chest wall, supraclavicular, and axillary node areas. She then moved on to antiestrogen therapy with letrozole (11/27/2008-04/20/2011). She was found to have recurrent breast cancer on supraclavicular lymph node biopsy on 03/30/2011 leading to a change in antiestrogen therapy to tamoxifen (04/21/2011-06/04/2014). Pet imaging on 05/25/2014 demonstrated progression of left submandibular lymph node, mediastinal lymph node, and probable lung involvement. This was followed by a biopsy on 06/13/2014 of a left supraclavicular mass that did reveal metastatic carcinoma consistent with breast primary. She was then treated with carboplatin and Taxol for 6 cycles with an excellent response (06/25/2014-10/09/2014). She was then transition back to hormone therapy consisting of Ibrance and Faslodex (11/13/2014-06/10/2015). This was complicated by grade 3 neutropenia with cycle #2 resulting in a dose reduction of Ibrance to 100 mg for cycle 3. Unfortunately, on 06/10/2015, she was noted to have progression of disease in the right mid cervical lymph node over a 4 day  period that was PET avid. Therapy was therefore changed to exemestane and everolimus (03/83/3383-29/19/1660) that was complicated by an adverse reaction consisting of diffuse pneumonitis. This regimen was therefore discontinued. She then transitioned back to systemic chemotherapy consisting of cisplatin every 14 days (08/23/2015-12/23/2015). She was put on a drug holiday beginning on 12/23/2015 in preparation for a trip to [place] on 01/25/2016.  She is now on Xeloda maintenance in a 7 day on and 7 day off fashion. AND squamous cell carcinoma of anus, HPV POSITIVE, S/P XRT with Xeloda concomitantly in the neoadjuvant setting.    Adenocarcinoma of left breast   04/09/2008 Pathology Results    Positive for breast cancer, ER 100%, PR 5%, HER-2/neu POSITIVE.      04/09/2008 Procedure    Biopsy of left axillary node mass       04/16/2008 - 08/13/2008 Chemotherapy    Dose dense epirubicin/Cytoxan 4 cycles followed by docetaxel 4 cycles. Herceptin initiated with docetaxel for a total of 52 weeks.      08/30/2008 Definitive Surgery    Left mastectomy revealing persistence of disease in the breast and lymph nodes.      09/17/2008 - 11/26/2008 Radiation Therapy    Radiation to left chest wall, supraclavicular, and axillary nodal areas.      11/27/2008 - 04/20/2011 Anti-estrogen oral therapy    Letrozole      03/30/2011 Procedure    Left supraclavicular lymph node biopsy      03/30/2011 Pathology Results    Recurrent breast cancer, invasive ductal type, ER 100%, PR 40%, HER-2 NEGATIVE      04/21/2011 - 06/04/2014 Anti-estrogen oral therapy    Tamoxifen      05/25/2014 PET scan    Progression in left submandibular lymph  node, mediastinal lymph nodes, and probable lung involvement.      05/25/2014 Progression    PET scan demonstrates progression of disease.      06/13/2014 Procedure    Left supraclavicular mass biopsy      06/13/2014 Pathology Results    Metastatic carcinoma consistent  with breast primary, involving skeletal muscle, perineural invasion, LVI, no lymphoid tissue identified, ER 98%, PR negative, HER-2 negative      06/25/2014 - 10/09/2014 Chemotherapy    Carboplatin/Taxol 6 cycles with excellent response      11/06/2014 - 06/10/2015 Chemotherapy    Palbociclib and Faslodex      01/08/2015 Adverse Reaction    Grade 3 neutropenia with cycle #2 of Palbociclib.      01/08/2015 Treatment Plan Change    Palbociclib dose reduced to 100 mg for cycle #3.      06/10/2015 Progression    Right cervical lymph node progression clinically developed over 4 days.  This lymph node was positive on PET scan in September 2016 and at that time was approximately 8 mm with possible progression in a single bone metastasis.      06/17/2015 Procedure    Biopsy of right cervical lymph node      06/17/2015 Pathology Results    Genetic sequencing performed revealing BRCA2 positivity      07/02/2015 - 08/01/2015 Chemotherapy    Exemestane/everolimus, 25 mg/ 5 mg x 7 days then increasing to 10 mg daily.      08/01/2015 PET scan    Diffuse pneumonitis.        08/01/2015 Adverse Reaction    Pneumonitis secondary to drug reaction from everolimus.      08/23/2015 - 12/23/2015 Chemotherapy    Cisplatin 40 mg/m every other week      11/15/2015 PET scan    Interval resolution of hypermetabolic airspace opacities in the lungs which were likely inflammatory. Abnormal high activity at the anal rectal junction. Very low grade residual activity at the site of L3 metastatic lesion.      12/23/2015 Treatment Plan Change    Holiday from treatment in preparation for trip to [place].      04/20/2016 -  Chemotherapy    XELODA 1300 mg po bid 7 days on/ 7 days off       05/15/2016 PET scan    1. Partial metabolic response at the hypermetabolic anorectal junction malignancy. 2. No hypermetabolic metastatic disease. Lumbar vertebral sclerotic osseous lesions are non hypermetabolic. 3.  Hypermetabolism associated with acute/early subacute fractures of the right clavicle and right anterior second through fifth ribs, without associated bone lesions. 4. Aortic atherosclerosis.  Three-vessel coronary atherosclerosis.       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 Britta Mccreedy      12/31/2015 Pathology Results    Invasive squamous cell carcinoma approximate 4 cm mass posterior wall of rectum      01/13/2016 Treatment Plan Change    Dr. Leighton Ruff consultation- no surgery.  Recommend chemoXRT      01/16/2016 Pathology Results    HPV POSITIVE      02/07/2016 - 03/10/2016 Chemotherapy    Xeloda 1450 mg PO BID Sunday PM- Friday AM during XRT      02/07/2016 - 03/10/2016 Radiation Therapy    Definitive XRT      02/19/2016 Treatment Plan Change    Xeloda on hold due to increased GERD symtpoms.  PPI dose increased.  Will  restart Xeloda tomorrow, 02/21/2016.      02/23/2016 Treatment Plan Change    Xeloda dose decreased to 1150 mg PO BID _0 /01/2016 PET scan    1. Partial metabolic response at the hypermetabolic anorectal junction malignancy.        She is moving her bowels well.  She denies any blood.  She is compliant with her Xeloda and tolerating well.   Review of Systems  Constitutional: Negative.  Negative for chills, fever and weight loss.  HENT: Negative.   Eyes: Negative.   Respiratory: Negative.  Negative for cough.   Cardiovascular: Negative.  Negative for chest pain.  Gastrointestinal: Negative.  Negative for blood in stool, diarrhea, melena, nausea and vomiting.  Genitourinary: Negative.  Negative for dysuria.  Musculoskeletal: Negative.   Skin: Negative.  Negative for rash.  Neurological: Negative.  Negative for weakness.  Endo/Heme/Allergies: Negative.     Past Medical History:  Diagnosis Date  . Adenocarcinoma of breast ([facility]) 01/27/2011   stage IIIc breast cancer  .  Bladder infection   . Bone metastasis ([facility]) 01/17/2016  . Borderline hypertension   . Breast cancer ([place])   . Closed fracture of humerus sept 2013  . Fatigue   . Generalized headaches   . Osteoporosis 01/27/2011  . Pneumonia   . Port catheter in place 04/14/2013  . Post-mastectomy lymphedema syndrome   . 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 Reflux   . Retinal detachment   . 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 Swollen lymph nodes   . URI (upper respiratory infection) 02/17/12  . Wears glasses     Past Surgical History:  Procedure Laterality Date  . biopsy of lymph node     super clavicle  . BREAST SURGERY    . BTL    . CATARACT EXTRACTION    . cervical disc fusioni    . LYMPH NODE BIOPSY Right 06/19/2015   Procedure: RIGHT NECK LYMPH NODE EXCISION;  Surgeon: Rolm Bookbinder, MD;  Location: [facility];  Service: General;  Laterality: Right;  . MASTECTOMY MODIFIED RADICAL    . PORT-A-CATH REMOVAL     in place (not removed)  . retinal detachment and repair      Family History  Problem Relation Age of Onset  . Hypertension Mother   . Heart disease Mother     atrial fib  . Heart disease Father   . Other Father     glaucoma    Social History   Social History  . Marital status: Divorced    Spouse name: N/A  . Number of children: N/A  . Years of education: N/A   Social History Main Topics  . Smoking status: Never Smoker  . Smokeless tobacco: Never Used  . Alcohol use Yes     Comment: not at present time due to chemo treatment  . Drug use: No  . Sexual activity: Not Asked   Other Topics Concern  . None   Social History Narrative  . None     PHYSICAL EXAMINATION  ECOG PERFORMANCE STATUS: 0 - Asymptomatic  Vitals:   05/19/16 0900  BP: 137/72  Pulse: 60  Resp: 16  Temp: 98.5 F (36.9 C)    GENERAL:alert, no distress, well nourished, well developed, comfortable, cooperative, smiling  and unaccompanied SKIN: skin color, texture, turgor are normal, no rashes or significant lesions HEAD:  Normocephalic, No masses, lesions, tenderness or abnormalities EYES: normal, EOMI, Conjunctiva are pink and non-injected EARS: External ears normal OROPHARYNX:lips, buccal mucosa, and tongue normal and mucous membranes are moist  NECK: supple, trachea midline LYMPH:  not examined BREAST:not examined LUNGS: clear to auscultation and percussion HEART: regular rate & rhythm, no murmurs, no gallops, S1 normal and S2 normal ABDOMEN:abdomen soft, non-tender and normal bowel sounds BACK: Back symmetric, no curvature. EXTREMITIES:less then 2 second capillary refill, no joint deformities, effusion, or inflammation, no edema, no skin discoloration, no clubbing, no cyanosis.  POSITIVE: right prominence of clavicule at the medial 1/3. NEURO: alert & oriented x 3 with fluent speech, no focal motor/sensory deficits, gait normal   LABORATORY DATA: CBC    Component Value Date/Time   WBC 5.2 05/19/2016 0853   RBC 3.33 (L) 05/19/2016 0853   HGB 11.9 (L) 05/19/2016 0853   HCT 35.9 (L) 05/19/2016 0853   PLT 170 05/19/2016 0853   MCV 107.8 (H) 05/19/2016 0853   MCH 35.7 (H) 05/19/2016 0853   MCHC 33.1 05/19/2016 0853   RDW 14.6 05/19/2016 0853   LYMPHSABS 1.3 05/19/2016 0853   MONOABS 0.4 05/19/2016 0853   EOSABS 0.1 05/19/2016 0853   BASOSABS 0.0 05/19/2016 0853      Chemistry      Component Value Date/Time   NA 136 05/19/2016 0853   K 4.0 05/19/2016 0853   CL 109 05/19/2016 0853   CO2 23 05/19/2016 0853   BUN 15 05/19/2016 0853   CREATININE 0.93 05/19/2016 0853      Component Value Date/Time   CALCIUM 8.7 (L) 05/19/2016 0853   ALKPHOS 100 05/19/2016 0853   AST 24 05/19/2016 0853   ALT 15 05/19/2016 0853   BILITOT 0.5 05/19/2016 0853        PENDING LABS:   RADIOGRAPHIC STUDIES:  Dg Clavicle Right  Result Date: 05/05/2016 Clinical:  History of right clavicle fracture  X-rays were done of the right clavicle, two views There is a fracture displaced in bayonet positioning of the mid shaft of the right clavicle. Impression:  Fracture right clavicle midshaft displaced Electronically Signed Sanjuana Kava, MD 9/26/20171:58 PM   Dg Clavicle Right  Result Date: 04/26/2016 CLINICAL DATA:  Acute right clavicle pain following fall from horse today. Initial encounter. EXAM: RIGHT CLAVICLE - 2+ VIEWS COMPARISON:  04/28/2012 FINDINGS: A comminuted fracture of the mid clavicle with is noted with 1 shaft width inferior displacement. A remote fracture of the right humeral neck identified. A right Port-A-Cath and cervical spine fusion hardware again noted. IMPRESSION: Comminuted mid clavicle fracture with 1 shaft width inferior displacement. Electronically Signed   By: Margarette Canada M.D.   On: 04/26/2016 14:14   Dg Knee 1-2 Views Left  Result Date: 05/05/2016 CLINICAL DATA:  Left knee pain after falling off horse 10 days ago EXAM: LEFT KNEE - 1-2 VIEW COMPARISON:  None. FINDINGS: Two views of the left knee submitted. No acute fracture or subluxation. No radiopaque foreign body. No joint effusion. Joint space is preserved. IMPRESSION: Negative. Electronically Signed   By: Lahoma Crocker M.D.   On: 05/05/2016 10:52   Dg Tibia/fibula Left  Result Date: 05/05/2016 CLINICAL DATA:  Status post fall from a horse 10 days ago with persistent proximal tibia and fibular pain just below the knee EXAM: LEFT TIBIA AND FIBULA - 2 VIEW COMPARISON:  None in PACs FINDINGS: The bones are subjectively adequately mineralized. The observed portions of the knee and ankle exhibit no acute abnormalities.  There is subjective narrowing of the ankle joint mortise. The fibula is intact. There is no periosteal reaction. The soft tissues of the proximal pretibial region are mildly swollen. IMPRESSION: No acute or significant chronic bony abnormality of the left tibia or fibula is observed. The pretibial soft tissues  exhibit mild swelling proximally which may reflect contusion. Electronically Signed   By: David  Martinique M.D.   On: 05/05/2016 10:58   Nm Pet Image Restag (ps) Skull Base To Thigh  Result Date: 05/15/2016 CLINICAL DATA:  Subsequent treatment strategy for metastatic left breast cancer diagnosed June 2012 and squamous cell carcinoma of the anus diagnosed June 2017. EXAM: NUCLEAR MEDICINE PET SKULL BASE TO THIGH TECHNIQUE: 6.7 mCi F-18 FDG was injected intravenously. Full-ring PET imaging was performed from the skull base to thigh after the radiotracer. CT data was obtained and used for attenuation correction and anatomic localization. FASTING BLOOD GLUCOSE:  Value: 107 mg/dl COMPARISON:  11/15/2015 PET-CT and 01/22/2016 CT chest, abdomen and pelvis. FINDINGS: NECK No hypermetabolic lymph nodes in the neck. CHEST No hypermetabolic axillary, mediastinal or hilar nodes. Right subclavian Port-A-Cath terminates in the lower third of the superior vena cava. Left anterior descending, left circumflex and right coronary atherosclerosis. Atherosclerotic nonaneurysmal thoracic aorta. No pleural effusions. No pneumothorax. Right upper lobe 7 mm ground-glass pulmonary nodule is stable since 04/18/2008, not associated with significant metabolism and considered benign. Stable subpleural reticulation in the anterior left upper lobe from radiation fibrosis. No acute consolidative airspace disease or new significant pulmonary nodules. Left mastectomy. Left axillary nodal dissection clips. ABDOMEN/PELVIS There is focal hypermetabolism in the right posterior anorectal junction with max SUV 11.7, previously 18.3 on 11/15/2015, decreased, with no discrete mass in this location on the CT images. No abnormal hypermetabolic activity within the liver, pancreas, adrenal glands, or spleen. No hypermetabolic lymph nodes in the abdomen or pelvis. Atherosclerotic nonaneurysmal abdominal aorta. SKELETON No hypermetabolism within the right  posterior L1 or L3 vertebral sclerotic lesions. No hypermetabolic osseous lesions suggestive of skeletal metastasis. There is hypermetabolism associated with a comminuted fracture of the mid to lateral shaft of the right clavicle. There is hypermetabolism associated with acute/early subacute anterior right second through fifth right rib fractures. No bone lesions are seen associated with these fractures. Surgical hardware from [facility] is seen in the lower cervical spine. There is curvilinear intramuscular hypermetabolism in the right posterior paraspinal musculature of the neck and chest, with no CT correlate, favored to represent the sequela of activity or soft tissue injury. IMPRESSION: 1. Partial metabolic response at the hypermetabolic anorectal junction malignancy. 2. No hypermetabolic metastatic disease. Lumbar vertebral sclerotic osseous lesions are non hypermetabolic. 3. Hypermetabolism associated with acute/early subacute fractures of the right clavicle and right anterior second through fifth ribs, without associated bone lesions. 4. Aortic atherosclerosis.  Three-vessel coronary atherosclerosis. Electronically Signed   By: Ilona Sorrel M.D.   On: 05/15/2016 13:58   Dg Hand Complete Right  Result Date: 04/26/2016 CLINICAL DATA:  65 year old female with acute right hand pain following fall from horse today. Initial encounter. EXAM: RIGHT HAND - COMPLETE 3+ VIEW COMPARISON:  None. FINDINGS: There is no evidence of fracture or dislocation. There is no evidence of arthropathy or other focal bone abnormality. Soft tissues are unremarkable. IMPRESSION: Negative. Electronically Signed   By: Margarette Canada M.D.   On: 04/26/2016 14:19     PATHOLOGY:    ASSESSMENT AND PLAN:  Adenocarcinoma of left breast Stage IV breast cancer ER/PR positive, HER-2/neu negative. BRCA2 POSITIVE.  History of stage IIIc left breast cancer in 2009 treated with neoadjuvant chemotherapy consisting of epirubicin and Cytoxan followed  by 4 cycles of docetaxel from 04/16/2008-08/13/2008 with the initiation of Herceptin for 52 weeks at the start of docetaxel. She then underwent a left mastectomy on 08/30/2008 showing persistent disease in the breast and lymph nodes. This was followed by radiation therapy from 09/17/2008-11/26/2008 to the left chest wall, supraclavicular, and axillary node areas. She then moved on to antiestrogen therapy with letrozole (11/27/2008-04/20/2011). She was found to have recurrent breast cancer on supraclavicular lymph node biopsy on 03/30/2011 leading to a change in antiestrogen therapy to tamoxifen (04/21/2011-06/04/2014). Pet imaging on 05/25/2014 demonstrated progression of left submandibular lymph node, mediastinal lymph node, and probable lung involvement. This was followed by a biopsy on 06/13/2014 of a left supraclavicular mass that did reveal metastatic carcinoma consistent with breast primary. She was then treated with carboplatin and Taxol for 6 cycles with an excellent response (06/25/2014-10/09/2014). She was then transition back to hormone therapy consisting of Ibrance and Faslodex (11/13/2014-06/10/2015). This was complicated by grade 3 neutropenia with cycle #2 resulting in a dose reduction of Ibrance to 100 mg for cycle 3. Unfortunately, on 06/10/2015, she was noted to have progression of disease in the right mid cervical lymph node over a 4 day period that was PET avid. Therapy was therefore changed to exemestane and everolimus (99/37/1696-78/93/8101) that was complicated by an adverse reaction consisting of diffuse pneumonitis. This regimen was therefore discontinued. She then transitioned back to systemic chemotherapy consisting of cisplatin every 14 days (08/23/2015-12/23/2015). She was put on a drug holiday beginning on 12/23/2015 in preparation for a trip to [place] on 01/25/2016.  She is now on Xeloda maintenance in a 7 day on and 7 day off fashion beginning on 04/20/2016.  Oncology history is  updated.  She has seen Dr. Mauro Kaufmann Muss in the past for second opinion.  Labs today: CBC diff, CMET.  I personally reviewed and went over laboratory results with the patient.  The results are noted within this dictation.  She has successfully tapered off of Fentanyl.  Pain is currently well controled.  She started her Xeloda today, 05/19/2016.  Right clavicular fracture is followed by Dr. Luna Glasgow.  Return in 3-4 weeks for follow-up, labs, and Xgeva.   Squamous cell carcinoma of anus ([facility]) Squamous cell carcinoma of anus, HPV POSITIVE, S/P XRT with Xeloda concomitantly in the neoadjuvant setting.  Initial medical oncology recommendations provided by Dr. Tressie Stalker at [facility].  Dr. Tressie Stalker does dictate that there is a history of anal intercourse in the remote past.  Oncology history updated.  I personally reviewed and went over radiographic studies with the patient.  The results are noted within this dictation.  PET demonstrates a partial metabolic response regarding anal malignancy.  Patient is educated that a partial response at this time does not necessarily mean failure of treatment.  It is not uncommon to continue to see regression of disease even up to 26 weeks post treatment according to ACT-II study.  I will message Dr. Leighton Ruff for ongoing follow-up of anal cancer.  At follow-up appointment, will plan for further PET restaging.  Bone metastasis ([facility]) Bone metastases from breast cancer. On Xgeva therapy. She is also taking calcium and vitamin D.  Xgeva due today BUT HELD due to hypocalcemia.  Will defer x 3-4 weeks.    Compliance with Ca++ encouraged.  I have also recommended TUM BID-TID for additional Ca++ supplementation.   ORDERS PLACED  FOR THIS ENCOUNTER: No orders of the defined types were placed in this encounter.   MEDICATIONS PRESCRIBED THIS ENCOUNTER: Meds ordered this encounter  Medications  . Acetylcarnitine HCl (ACETYL L-CARNITINE PO)    Sig: Take 1,000  mg by mouth daily.    THERAPY PLAN:  Continue Xeloda maintenance therapy.  All questions were answered. The patient knows to call the clinic with any problems, questions or concerns. We can certainly see the patient much sooner if necessary.  Patient and plan discussed with Dr. Ancil Linsey and she is in agreement with the aforementioned.   This note is electronically signed by: Robynn Pane, PA-C 05/19/2016 10:04 AM

## 2016-05-19 NOTE — Progress Notes (Signed)
Calcium 8.7 Lab results shown to [facility] and B.Sonny Masters, pharmacist. Delton See held for today

## 2016-05-20 MED ORDER — HEPARIN SOD (PORK) LOCK FLUSH 100 UNIT/ML IV SOLN
INTRAVENOUS | Status: AC
Start: 2016-05-20 — End: 2016-05-20
  Filled 2016-05-20: qty 5

## 2016-05-20 MED ORDER — OCTREOTIDE ACETATE 30 MG IM KIT
PACK | INTRAMUSCULAR | Status: AC
  Filled 2016-05-20: qty 1

## 2016-05-20 MED ORDER — HEPARIN SOD (PORK) LOCK FLUSH 100 UNIT/ML IV SOLN
INTRAVENOUS | Status: AC
  Filled 2016-05-20: qty 5

## 2016-05-26 ENCOUNTER — Ambulatory Visit (INDEPENDENT_AMBULATORY_CARE_PROVIDER_SITE_OTHER): Payer: BLUE CROSS/BLUE SHIELD

## 2016-05-26 ENCOUNTER — Encounter: Payer: Self-pay | Admitting: Orthopaedic Surgery

## 2016-05-26 ENCOUNTER — Ambulatory Visit (INDEPENDENT_AMBULATORY_CARE_PROVIDER_SITE_OTHER): Payer: BLUE CROSS/BLUE SHIELD | Admitting: Orthopaedic Surgery

## 2016-05-26 DIAGNOSIS — S42021D Displaced fracture of shaft of right clavicle, subsequent encounter for fracture with routine healing: Secondary | ICD-10-CM | POA: Diagnosis not present

## 2016-05-26 NOTE — Progress Notes (Signed)
CC:  My shoulder is better  She fell again last week while walking her dog.  She has some increased pain in the right clavicle area.  She had a PET scan in relation to the cancer. She also has fracture of the right 2 thru 5 ribs she says.  They are not hurting.  She would like to begin OT for the shoulder.  I will set this up.  She has good motion of the right shoulder.  She has no crepitus.  She has "knot" over the fracture site of the mid clavicle on the right.  NV is intact.  X-rays were done today of the right clavicle, reported separately.  Encounter Diagnosis  Name Primary?  . Closed displaced fracture of shaft of right clavicle with routine healing, subsequent encounter Yes   Return in one month.  Begin OT.  X-rays on return of the right clavicle.  Electronically Signed Sanjuana Kava, MD 10/17/201710:04 AM

## 2016-06-01 ENCOUNTER — Encounter (HOSPITAL_COMMUNITY): Payer: Self-pay

## 2016-06-01 ENCOUNTER — Ambulatory Visit (HOSPITAL_COMMUNITY): Payer: BLUE CROSS/BLUE SHIELD | Attending: Orthopaedic Surgery

## 2016-06-01 DIAGNOSIS — M25611 Stiffness of right shoulder, not elsewhere classified: Secondary | ICD-10-CM

## 2016-06-01 DIAGNOSIS — R29898 Other symptoms and signs involving the musculoskeletal system: Secondary | ICD-10-CM

## 2016-06-01 DIAGNOSIS — M25511 Pain in right shoulder: Secondary | ICD-10-CM | POA: Diagnosis present

## 2016-06-01 NOTE — Therapy (Signed)
[place], [place], [address] Phone: [phone]   Fax:  [phone]  Occupational Therapy Evaluation  Patient Details  Name: [name] MRN: [id number] Date of Birth: 07-17-1951 Referring Provider: Sanjuana Kava, MD  Encounter Date: 06/01/2016      OT End of Session - 06/01/16 1451    Visit Number 1   Number of Visits 16   Date for OT Re-Evaluation 07/31/16   Authorization Type BC[employer]   Authorization Time Period 30 visits PT/OT/Chiropractor - 0 used.   Authorization - Visit Number 1   Authorization - Number of Visits 30   OT Start Time 1350   OT Stop Time 1430   OT Time Calculation (min) 40 min   Activity Tolerance Patient tolerated treatment well   Behavior During Therapy WFL for tasks assessed/performed      Past Medical History:  Diagnosis Date  . 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 Bladder infection   . Bone metastasis ([facility]) 01/17/2016  . Borderline hypertension   . Breast cancer ([facility])   . Closed fracture of humerus sept 2013  . Fatigue   . Generalized headaches   . Osteoporosis 01/27/2011  . Pneumonia   . Port catheter in place 04/14/2013  . Post-mastectomy lymphedema syndrome   . 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 Reflux   . Retinal detachment   . 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 Swollen lymph nodes   . URI (upper respiratory infection) 02/17/12  . Wears glasses     Past Surgical History:  Procedure Laterality Date  . biopsy of lymph node     super clavicle  . BREAST SURGERY    . BTL    . CATARACT EXTRACTION    . cervical disc fusioni    . LYMPH NODE BIOPSY Right 06/19/2015   Procedure: RIGHT NECK LYMPH NODE EXCISION;  Surgeon: Rolm Bookbinder, MD;  Location: [facility];  Service: General;  Laterality: Right;  . MASTECTOMY MODIFIED RADICAL    . PORT-A-CATH REMOVAL     in  place (not removed)  . retinal detachment and repair      There were no vitals filed for this visit.      Subjective Assessment - 06/01/16 1352    Subjective  S: I've just been doing everything below 70 degrees and not overdoing it.    Pertinent History Patient is a 65 y/o female S/P closed displaced fracture of the shaft of right clavicle with routine healing which occurred when thrown from a horse on 05/05/16. X-ray was completed which showed rib fractures 2-5 and a right clavicle fracture.  The week of October 8th, patient fell when walking her dog while faling on her right shoulder. No additional injury to right clavicle was performed. Dr. Luna Glasgow has referred patient to occupational therapy for evaluation and treatment.    Special Tests FOTO score: 41/100   Patient Stated Goals regain strength and ROM.   Currently in Pain? Yes   Pain Score 2    Pain Location Shoulder   Pain Orientation Right   Pain Descriptors / Indicators Aching   Pain Radiating Towards N/A   Pain Onset More than a month ago   Pain Frequency Constant   Aggravating Factors  Quick movements   Pain Relieving Factors Rest   Effect of Pain on Daily Activities Mod-max effect  Multiple Pain Sites No           [facility] OT Assessment - 06/01/16 1346      Assessment   Diagnosis Right clavicle fratcure   Referring Provider Sanjuana Kava, MD   Onset Date --  Sept   Assessment 06/30/16   Prior Therapy None for this condition     Precautions   Precautions Shoulder   Type of Shoulder Precautions (4-8 weeks): full A/ROM/P/ROM by 6 weeks. A/ROM as symptoms and pain permits. No extreme ROM. (8-12 weeks): Full A/ROM in all planes. No heavy lifting. (>12 weeks): advanced strengthening as tolerated.    Precaution Comments Port a Cath under Right clavicle. No manual therapy or ES around port a cath. Per MD notes: active breast and colon cancer.      Restrictions   Weight Bearing Restrictions Yes     Balance Screen   Has  the patient fallen in the past 6 months Yes   How many times? 1  dog knocked patient down   Has the patient had a decrease in activity level because of a fear of falling?  No   Is the patient reluctant to leave their home because of a fear of falling?  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 Taking care of horses.     ADL   ADL comments Difficulty completing all daily work requirements such as heavy lifying. Restrictions with ROM.      Mobility   Mobility Status Independent     Written Expression   Dominant Hand Right     Vision - History   Baseline Vision Wears glasses all the time     Cognition   Overall Cognitive Status Within Functional Limits for tasks assessed     ROM / Strength   AROM / PROM / Strength AROM;PROM;Strength     Palpation   Palpation comment Mod fascial restrictions in anterior deltoid and upper trapezius, and scapularis region.     AROM   Overall AROM Comments Assessed standing. IR/er adducted.   AROM Assessment Site Shoulder   Right/Left Shoulder Right   Right Shoulder Flexion 116 Degrees   Right Shoulder ABduction 110 Degrees   Right Shoulder Internal Rotation 90 Degrees   Right Shoulder External Rotation 73 Degrees     PROM   Overall PROM Comments Assessed supine. IR/er adducted.   PROM Assessment Site Shoulder   Right/Left Shoulder Right   Right Shoulder Flexion 128 Degrees   Right Shoulder ABduction 116 Degrees   Right Shoulder Internal Rotation 90 Degrees   Right Shoulder External Rotation 70 Degrees     Strength   Overall Strength Comments Assessed seated. IR/er adducted.   Strength Assessment Site Shoulder   Right/Left Shoulder Right   Right Shoulder Flexion 3/5   Right Shoulder ABduction 3/5   Right  Shoulder Internal Rotation 3/5   Right Shoulder External Rotation 3/5                         OT Education - 06/01/16 1451    Education provided Yes   Education Details AA/ROM shoulder exercises. Activity and task modification suggesions.    Person(s) Educated Patient   Methods Explanation;Demonstration;Verbal cues;Handout   Comprehension Returned  demonstration;Verbalized understanding          OT Short Term Goals - 06/01/16 1500      OT SHORT TERM GOAL #1   Title Patient will be educated and independent with HEP to increase functional use of RUE during daily tasks.    Time 4   Period Weeks   Status New     OT SHORT TERM GOAL #2   Title Patient will increase A/ROM in RUE to WNL to increase ability to complete reaching activities for work.   Time 4   Period Weeks   Status New     OT SHORT TERM GOAL #3   Title Patient will increase RUE strength to 3+/5 to increase ability to complete lightweight lifting activities.    Time 4   Period Weeks   Status New           OT Long Term Goals - 06/01/16 1506      OT LONG TERM GOAL #1   Title Patient will return to highest level of independence with all daily and work related tasks.   Time 8   Period Weeks   Status New     OT LONG TERM GOAL #2   Title Patient will decrease fascial restrictions on trace amount in RUE to increase functional mobility needed for high level reaching.    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   Time 8   Period Weeks   Status New     OT LONG TERM GOAL #4   Title Patient's pain will remain low at a 2/10 or less when using RUE during daily tasks.    Time 8   Period Weeks   Status New               Plan - 06/01/16 1453    Clinical Impression Statement A: patient is a 65 y/o female S/P right clavicle fracture causing increased pain,  fascial restrictions, and decreased strength and ROM resulting in difficulty completing daily and work related tasks using RUE.    Rehab Potential Excellent   Clinical Impairments Affecting Rehab Potential Active breast and colon cancer.   OT Frequency 2x / week   OT Duration 8 weeks   OT Treatment/Interventions Self-care/ADL training;Therapeutic exercise;Patient/family education;Dry needling;Ultrasound;Therapeutic activities;DME and/or AE instruction;Cryotherapy;Electrical Stimulation;Moist Heat;Passive range of motion   Plan P: Patient will benefit from skilled OT services to increase functional performance during daily and work tasks. Treatment Plan: Myofascial release, manual stretching, AA/ROM, A/ROM, general shoulder and scapular strengthening.    OT Home Exercise Plan 10/23: AA/ROM exercises   Consulted and Agree with Plan of Care Patient      Patient will benefit from skilled therapeutic intervention in order to improve the following deficits and impairments:  Decreased strength, Decreased range of motion, Pain, Increased fascial restricitons  Visit Diagnosis: Other symptoms and signs involving the musculoskeletal system - Plan: Ot plan of care cert/re-cert  Stiffness of right shoulder, not elsewhere classified - Plan: Ot plan of care cert/re-cert  Acute pain of right shoulder - Plan: Ot plan of care cert/re-cert    Problem List Patient Active Problem List   Diagnosis Date Noted  . Coronary artery calcification seen on CT scan 03/22/2016  . 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  Bone metastasis ([facility]) 01/17/2016  . 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 Port catheter in place 04/14/2013  . Adenocarcinoma of left breast 01/27/2011  . Osteoporosis 01/27/2011   Ailene Ravel, OTR/L,CBIS  [phone]  06/01/2016, 4:20 PM  [facility] [address] [place], [place], [address] Phone: [phone]   Fax:  [phone]  Name:  [name] MRN: [id number] Date of Birth: 1951-04-17

## 2016-06-01 NOTE — Patient Instructions (Signed)

## 2016-06-03 ENCOUNTER — Other Ambulatory Visit (HOSPITAL_COMMUNITY): Payer: Self-pay | Admitting: Emergency Medicine

## 2016-06-03 DIAGNOSIS — C50912 Malignant neoplasm of unspecified site of left female breast: Secondary | ICD-10-CM

## 2016-06-03 MED ORDER — CAPECITABINE 150 MG PO TABS: ORAL_TABLET | ORAL | 0 refills | Status: DC

## 2016-06-03 MED ORDER — CAPECITABINE 500 MG PO TABS: ORAL_TABLET | ORAL | 0 refills | Status: DC

## 2016-06-03 NOTE — Progress Notes (Signed)
Xeloda refilled  

## 2016-06-04 ENCOUNTER — Encounter (HOSPITAL_COMMUNITY): Payer: Self-pay

## 2016-06-04 ENCOUNTER — Ambulatory Visit (HOSPITAL_COMMUNITY): Payer: BLUE CROSS/BLUE SHIELD

## 2016-06-04 DIAGNOSIS — R29898 Other symptoms and signs involving the musculoskeletal system: Secondary | ICD-10-CM | POA: Diagnosis not present

## 2016-06-04 DIAGNOSIS — M25511 Pain in right shoulder: Secondary | ICD-10-CM

## 2016-06-04 DIAGNOSIS — M25611 Stiffness of right shoulder, not elsewhere classified: Secondary | ICD-10-CM

## 2016-06-04 NOTE — Patient Instructions (Signed)
1) Shoulder Protraction    Begin with elbows by your side, slowly "punch" straight out in front of you.      2) Shoulder Flexion   Standing:         Begin with arms at your side with thumbs pointed up, slowly raise both arms up and forward towards overhead.      3) Horizontal abduction/adduction    Standing:           Begin with arms straight out in front of you, bring out to the side in at "T" shape. Keep arms straight entire time.      4) Internal & External Rotation    *No band* -Stand with elbows at the side and elbows bent 90 degrees. Move your forearms away from your body, then bring back inward toward the body.     5) Shoulder Abduction   Standing:   *ONLY COMPLETE TO HALF WAY. FOCUS ON FORM AND DO NOT HIKE YOUR SHOULDER.*     Lying on your back begin with your arms flat on the table next to your side. Slowly move your arms out to the side so that they go overhead, in a jumping jack or snow angel movement.    .    Repeat all exercises 10-15 times, 1-2 times per day.

## 2016-06-04 NOTE — Therapy (Signed)
[place], [place], [address] Phone: [phone]   Fax:  [phone]  Occupational Therapy Treatment  Patient Details  Name: [name] MRN: [id number] Date of Birth: 31-Aug-1950 Referring Provider: Sanjuana Kava, MD  Encounter Date: 06/04/2016      OT End of Session - 06/04/16 1107    Visit Number 2   Number of Visits 16   Date for OT Re-Evaluation 07/31/16   Authorization Type BCBS   Authorization Time Period 30 visits PT/OT/Chiropractor - 0 used.   Authorization - Visit Number 2   Authorization - Number of Visits 30   OT Start Time [phone]   OT Stop Time 1030   OT Time Calculation (min) 42 min   Activity Tolerance Patient tolerated treatment well   Behavior During Therapy WFL for tasks assessed/performed      Past Medical History:  Diagnosis Date  . 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 Bladder infection   . Bone metastasis ([facility]) 01/17/2016  . Borderline hypertension   . Breast cancer ([facility])   . Closed fracture of humerus sept 2013  . Fatigue   . Generalized headaches   . Osteoporosis 01/27/2011  . Pneumonia   . Port catheter in place 04/14/2013  . Post-mastectomy lymphedema syndrome   . 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 Reflux   . Retinal detachment   . 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 Swollen lymph nodes   . URI (upper respiratory infection) 02/17/12  . Wears glasses     Past Surgical History:  Procedure Laterality Date  . biopsy of lymph node     super clavicle  . BREAST SURGERY    . BTL    . CATARACT EXTRACTION    . cervical disc fusioni    . LYMPH NODE BIOPSY Right 06/19/2015   Procedure: RIGHT NECK LYMPH NODE EXCISION;  Surgeon: Rolm Bookbinder, MD;  Location: [facility];  Service: General;  Laterality: Right;  . MASTECTOMY MODIFIED RADICAL    . PORT-A-CATH REMOVAL     in  place (not removed)  . retinal detachment and repair      There were no vitals filed for this visit.      Subjective Assessment - 06/04/16 1014    Subjective  S: I'm doing my exercises at home ok.   Currently in Pain? Yes   Pain Score 2    Pain Location Shoulder   Pain Orientation Right   Pain Descriptors / Indicators Aching   Pain Type Acute pain            [facility] OT Assessment - 06/04/16 1016      Assessment   Diagnosis Right clavicle fratcure     Precautions   Precautions Shoulder   Type of Shoulder Precautions Standard protocol: (4-8 weeks): full A/ROM/P/ROM by 6 weeks. A/ROM as symptoms and pain permits. No extreme ROM. (8-12 weeks): Full A/ROM in all planes. No heavy lifting. (>12 weeks): advanced strengthening as tolerated.    Precaution Comments Port a Cath under Right clavicle. No manual therapy or ES around port a cath. Per MD notes: active breast and colon cancer.                   OT Treatments/Exercises (OP) - 06/04/16 1016      Exercises   Exercises Shoulder  Shoulder Exercises: Supine   Protraction PROM;AROM;10 reps   Horizontal ABduction PROM;AROM;10 reps   External Rotation PROM;AROM;10 reps   Internal Rotation PROM;AROM;10 reps   Flexion PROM;AROM;10 reps   ABduction PROM;AROM;10 reps     Shoulder Exercises: Pulleys   Flexion 1 minute   ABduction 1 minute     Shoulder Exercises: ROM/Strengthening   Wall Wash 1'     Manual Therapy   Manual Therapy Myofascial release   Manual therapy comments Manual therapy completed prior to exercises.    Myofascial Release Myofascial release and manual stretching                OT Education - 06/04/16 1106    Education provided Yes   Education Details A/ROM shoulder exercises. Discussed not lifting anything at this time and to remain within pain tolerance. No extreme ROM.   Person(s) Educated Patient   Methods Explanation;Demonstration;Verbal cues;Handout   Comprehension Returned  demonstration;Verbalized understanding          OT Short Term Goals - 06/04/16 1109      OT SHORT TERM GOAL #1   Title Patient will be educated and independent with HEP to increase functional use of RUE during daily tasks.    Time 4   Period Weeks   Status On-going     OT SHORT TERM GOAL #2   Title Patient will increase A/ROM in RUE to WNL to increase ability to complete reaching activities for work.   Time 4   Period Weeks   Status On-going     OT SHORT TERM GOAL #3   Title Patient will increase RUE strength to 3+/5 to increase ability to complete lightweight lifting activities.    Time 4   Period Weeks   Status On-going           OT Long Term Goals - 06/04/16 1109      OT LONG TERM GOAL #1   Title Patient will return to highest level of independence with all daily and work related tasks.   Time 8   Period Weeks   Status On-going     OT LONG TERM GOAL #2   Title Patient will decrease fascial restrictions on trace amount in RUE to increase functional mobility needed for high level reaching.    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   Time 8   Period Weeks   Status On-going     OT LONG TERM GOAL #4   Title Patient's pain will remain low at a 2/10 or less when using RUE during daily tasks.    Time 8   Period Weeks   Status On-going               Plan - 06/04/16 1107    Clinical Impression Statement A: Initiated myofascial release, manual stretching, and A/ROM exercises. Patient doing well and was upgraded to A/ROM exercises. Did have some difficulty with standing abduction. Pt was instructed to focus on form and only complete to half way. HEP was upgraded to A/ROM.    Plan P: Follow up on new HEP. Continue with A/ROM exercises. No extreme ROM and remain within pain tolerance. At 6 weeks, ok to perform light  weight lifting.    OT Home Exercise Plan 10/23: AA/ROM exercises. 10/26:  Stop AA/ROM and start A/ROM shoulder exercises.       Patient will benefit from skilled therapeutic intervention in order to improve the following deficits and impairments:  Decreased strength, Decreased range of motion, Pain, Increased fascial restricitons  Visit Diagnosis: Other symptoms and signs involving the musculoskeletal system  Stiffness of right shoulder, not elsewhere classified  Acute pain of right shoulder    Problem List Patient Active Problem List   Diagnosis Date Noted  . Coronary artery calcification seen on CT scan 03/22/2016  . 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 Bone metastasis ([facility]) 01/17/2016  . 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 Port catheter in place 04/14/2013  . Adenocarcinoma of left breast 01/27/2011  . Osteoporosis 01/27/2011   Ailene Ravel, OTR/L,CBIS  [phone]  06/04/2016, 11:11 AM  [facility] [place], [place], 04/27/1951 Phone: [phone]   Fax:  [phone]  Name: [name] MRN: [id number] Date of Birth: 16-Feb-1951

## 2016-06-08 ENCOUNTER — Ambulatory Visit (HOSPITAL_COMMUNITY): Payer: BLUE CROSS/BLUE SHIELD | Admitting: Specialist

## 2016-06-08 DIAGNOSIS — R29898 Other symptoms and signs involving the musculoskeletal system: Secondary | ICD-10-CM

## 2016-06-08 DIAGNOSIS — M25611 Stiffness of right shoulder, not elsewhere classified: Secondary | ICD-10-CM

## 2016-06-08 DIAGNOSIS — M25511 Pain in right shoulder: Secondary | ICD-10-CM

## 2016-06-08 NOTE — Therapy (Addendum)
[place], [place], [address] Phone: [phone]   Fax:  [phone]  Occupational Therapy Treatment  Patient Details  Name: [name] MRN: [id number] Date of Birth: September 06, 1950 Referring Provider: Sanjuana Kava, MD  Encounter Date: 06/08/2016      OT End of Session - 06/08/16 1401    Visit Number 3   Number of Visits 16   Date for OT Re-Evaluation 07/31/16   Authorization Type BCBS   Authorization Time Period 30 visits PT/OT/Chiropractor - 0 used.   Authorization - Visit Number 3   Authorization - Number of Visits 30   OT Start Time [phone]   OT Stop Time 0945   OT Time Calculation (min) 40 min   Activity Tolerance Patient tolerated treatment well   Behavior During Therapy WFL for tasks assessed/performed      Past Medical History:  Diagnosis Date  . 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 Bladder infection   . Bone metastasis ([facility]) 01/17/2016  . Borderline hypertension   . Breast cancer ([facility])   . Closed fracture of humerus sept 2013  . Fatigue   . Generalized headaches   . Osteoporosis 01/27/2011  . Pneumonia   . Port catheter in place 04/14/2013  . Post-mastectomy lymphedema syndrome   . 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 Reflux   . Retinal detachment   . 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 Swollen lymph nodes   . URI (upper respiratory infection) 02/17/12  . Wears glasses     Past Surgical History:  Procedure Laterality Date  . biopsy of lymph node     super clavicle  . BREAST SURGERY    . BTL    . CATARACT EXTRACTION    . cervical disc fusioni    . LYMPH NODE BIOPSY Right 06/19/2015   Procedure: RIGHT NECK LYMPH NODE EXCISION;  Surgeon: Rolm Bookbinder, MD;  Location: [facility];  Service: General;  Laterality: Right;  . MASTECTOMY MODIFIED RADICAL    . PORT-A-CATH REMOVAL     in  place (not removed)  . retinal detachment and repair      There were no vitals filed for this visit.      Subjective Assessment - 06/08/16 0905    Subjective  S:  I feel ok some pain in top of the shoulder.   Currently in Pain? Yes   Pain Score 2    Pain Location Shoulder   Pain Orientation Right   Pain Descriptors / Indicators Aching   Pain Type Acute pain            [facility] OT Assessment - 06/08/16 0001      Assessment   Diagnosis Right clavicle fratcure     Precautions   Precautions Shoulder   Type of Shoulder Precautions Standard protocol: (4-8 weeks): full A/ROM/P/ROM by 6 weeks. A/ROM as symptoms and pain permits. No extreme ROM. (8-12 weeks): Full A/ROM in all planes. No heavy lifting. (>12 weeks): advanced strengthening as tolerated.    Precaution Comments Port a Cath under Right clavicle. No manual therapy or ES around port a cath. Per MD notes: active breast and colon cancer.                   OT Treatments/Exercises (OP) - 06/08/16 0001      Exercises   Exercises Shoulder  Shoulder Exercises: Supine   Protraction PROM;5 reps;AROM;12 reps   Horizontal ABduction PROM;5 reps;AROM;12 reps   External Rotation PROM;5 reps;AROM;12 reps   Internal Rotation PROM;5 reps;AROM;12 reps   Flexion PROM;5 reps;AROM;12 reps   ABduction PROM;5 reps;AROM;12 reps     Shoulder Exercises: Standing   Protraction AROM;10 reps   Horizontal ABduction AROM;10 reps   External Rotation AROM;10 reps   Internal Rotation AROM;10 reps   Flexion AROM;10 reps   ABduction AROM;10 reps     Shoulder Exercises: Therapy Ball   Right/Left 5 reps     Shoulder Exercises: ROM/Strengthening   Wall Wash 2'   "W" Arms 10 times   X to V Arms 10 times   Proximal Shoulder Strengthening, Supine 10 times A/ROM   Proximal Shoulder Strengthening, Seated 10 times A/ROM     Manual Therapy   Manual Therapy Myofascial release   Manual therapy comments Manual therapy completed prior to  exercises.    Myofascial Release Myofascial release and manual stretching                OT Education - 06/08/16 1359    Education Details tendon glides and mod resist red theraputty for right hand strengthening (patient states right hand was sprained during fall).   Person(s) Educated Patient   Methods Explanation;Demonstration;Handout   Comprehension Verbalized understanding;Returned demonstration          OT Short Term Goals - 06/04/16 1109      OT SHORT TERM GOAL #1   Title Patient will be educated and independent with HEP to increase functional use of RUE during daily tasks.    Time 4   Period Weeks   Status On-going     OT SHORT TERM GOAL #2   Title Patient will increase A/ROM in RUE to WNL to increase ability to complete reaching activities for work.   Time 4   Period Weeks   Status On-going     OT SHORT TERM GOAL #3   Title Patient will increase RUE strength to 3+/5 to increase ability to complete lightweight lifting activities.    Time 4   Period Weeks   Status On-going           OT Long Term Goals - 06/04/16 1109      OT LONG TERM GOAL #1   Title Patient will return to highest level of independence with all daily and work related tasks.   Time 8   Period Weeks   Status On-going     OT LONG TERM GOAL #2   Title Patient will decrease fascial restrictions on trace amount in RUE to increase functional mobility needed for high level reaching.    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   Time 8   Period Weeks   Status On-going     OT LONG TERM GOAL #4   Title Patient's pain will remain low at a 2/10 or less when using RUE during daily tasks.    Time 8   Period Weeks   Status On-going               Plan - 06/08/16 1402    Clinical Impression Statement A:  Patient demonstrating functiona A/ROM  in supine and standing, except for abduction and external rotation.  Noted popping with A/ROM exercises in shoulder joint.   P:  Patient will improve shoulder abduction to WNL with decreased pain and popping sensation.  Follow up on ability to abduct in combination with external rotation.    OT Home Exercise Plan 10/23: AA/ROM exercises. 10/26: Stop AA/ROM and start A/ROM shoulder exercises. 10/30:  tendon glides and red theraputty      Patient will benefit from skilled therapeutic intervention in order to improve the following deficits and impairments:  Decreased strength, Decreased range of motion, Pain, Increased fascial restricitons  Visit Diagnosis: Other symptoms and signs involving the musculoskeletal system  Acute pain of right shoulder  Stiffness of right shoulder, not elsewhere classified    Problem List Patient Active Problem List   Diagnosis Date Noted  . Coronary artery calcification seen on CT scan 03/22/2016  . 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 Bone metastasis ([facility]) 01/17/2016  . 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 Port catheter in place 04/14/2013  . Adenocarcinoma of left breast 01/27/2011  . Osteoporosis 01/27/2011    Vangie Bicker, [place], OTR/L [phone]  06/08/2016, 2:05 PM  [place], [place], [address] Phone: [phone]   Fax:  [phone]  Name: [name] MRN: [id number] Date of Birth: May 16, 1951

## 2016-06-10 ENCOUNTER — Encounter (HOSPITAL_COMMUNITY): Payer: Self-pay

## 2016-06-10 ENCOUNTER — Ambulatory Visit (HOSPITAL_COMMUNITY): Payer: BLUE CROSS/BLUE SHIELD | Attending: Orthopaedic Surgery

## 2016-06-10 DIAGNOSIS — M25511 Pain in right shoulder: Secondary | ICD-10-CM | POA: Diagnosis present

## 2016-06-10 DIAGNOSIS — R29898 Other symptoms and signs involving the musculoskeletal system: Secondary | ICD-10-CM | POA: Insufficient documentation

## 2016-06-10 DIAGNOSIS — M25611 Stiffness of right shoulder, not elsewhere classified: Secondary | ICD-10-CM | POA: Diagnosis present

## 2016-06-10 NOTE — Therapy (Signed)
[place], [place], 04/30/1951 Phone: [phone]   Fax:  [phone]  Occupational Therapy Treatment  Patient Details  Name: [name] MRN: [id number] Date of Birth: Jun 10, 1951 Referring Provider: Sanjuana Kava, MD  Encounter Date: 06/10/2016      OT End of Session - 06/10/16 1146    Visit Number 4   Number of Visits 16   Date for OT Re-Evaluation 07/31/16   Authorization Type BCBS   Authorization Time Period 30 visits PT/OT/Chiropractor - 0 used.   Authorization - Visit Number 4   Authorization - Number of Visits 30   OT Start Time [id number]   OT Stop Time 1200   OT Time Calculation (min) 42 min   Activity Tolerance Patient tolerated treatment well   Behavior During Therapy WFL for tasks assessed/performed      Past Medical History:  Diagnosis Date  . 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 Bladder infection   . Bone metastasis ([facility]) 01/17/2016  . Borderline hypertension   . Breast cancer ([facility])   . Closed fracture of humerus sept 2013  . Fatigue   . Generalized headaches   . Osteoporosis 01/27/2011  . Pneumonia   . Port catheter in place 04/14/2013  . Post-mastectomy lymphedema syndrome   . 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 Reflux   . Retinal detachment   . 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 Swollen lymph nodes   . URI (upper respiratory infection) 02/17/12  . Wears glasses     Past Surgical History:  Procedure Laterality Date  . biopsy of lymph node     super clavicle  . BREAST SURGERY    . BTL    . CATARACT EXTRACTION    . cervical disc fusioni    . LYMPH NODE BIOPSY Right 06/19/2015   Procedure: RIGHT NECK LYMPH NODE EXCISION;  Surgeon: Rolm Bookbinder, MD;  Location: [facility];  Service: General;  Laterality: Right;  . MASTECTOMY MODIFIED RADICAL    . PORT-A-CATH REMOVAL     in  place (not removed)  . retinal detachment and repair      There were no vitals filed for this visit.      Subjective Assessment - 06/10/16 1135    Subjective  S: I feel like I was hiking my shoulder a lot this morning so my trap is tight.    Currently in Pain? Yes   Pain Score 3             [facility] OT Assessment - 06/10/16 1136      Assessment   Diagnosis Right clavicle fratcure     Precautions   Precautions Shoulder   Type of Shoulder Precautions Standard protocol: (4-8 weeks): full A/ROM/P/ROM by 6 weeks. A/ROM as symptoms and pain permits. No extreme ROM. (8-12 weeks): Full A/ROM in all planes. No heavy lifting. (>12 weeks): advanced strengthening as tolerated.    Precaution Comments Port a Cath under Right clavicle. No manual therapy or ES around port a cath. Per MD notes: active breast and colon cancer.                   OT Treatments/Exercises (OP) - 06/10/16 1137      Exercises   Exercises Shoulder     Shoulder Exercises: Supine   Protraction PROM;5 reps;AROM;12 reps   Horizontal  ABduction PROM;5 reps;AROM;12 reps   External Rotation PROM;5 reps;AROM;12 reps   Internal Rotation PROM;5 reps;AROM;12 reps   Flexion PROM;5 reps;AROM;12 reps   ABduction PROM;5 reps;AROM;12 reps     Shoulder Exercises: Prone   Other Prone Exercises [place] exercises; 10X     Shoulder Exercises: Standing   Protraction AROM;12 reps   Horizontal ABduction AROM;12 reps   External Rotation AROM;12 reps   Internal Rotation AROM;12 reps   Flexion AROM;12 reps   ABduction AROM;12 reps     Shoulder Exercises: Therapy Ball   Right/Left 5 reps     Shoulder Exercises: ROM/Strengthening   Wall Wash 2'   X to V Arms 10 times   Proximal Shoulder Strengthening, Supine 12 times A/ROM   Proximal Shoulder Strengthening, Seated 12 times A/ROM     Manual Therapy   Manual Therapy Myofascial release   Manual therapy comments Manual therapy completed prior to exercises.     Myofascial Release Myofascial release and manual stretching                  OT Short Term Goals - 06/04/16 1109      OT SHORT TERM GOAL #1   Title Patient will be educated and independent with HEP to increase functional use of RUE during daily tasks.    Time 4   Period Weeks   Status On-going     OT SHORT TERM GOAL #2   Title Patient will increase A/ROM in RUE to WNL to increase ability to complete reaching activities for work.   Time 4   Period Weeks   Status On-going     OT SHORT TERM GOAL #3   Title Patient will increase RUE strength to 3+/5 to increase ability to complete lightweight lifting activities.    Time 4   Period Weeks   Status On-going           OT Long Term Goals - 06/04/16 1109      OT LONG TERM GOAL #1   Title Patient will return to highest level of independence with all daily and work related tasks.   Time 8   Period Weeks   Status On-going     OT LONG TERM GOAL #2   Title Patient will decrease fascial restrictions on trace amount in RUE to increase functional mobility needed for high level reaching.    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   Time 8   Period Weeks   Status On-going     OT LONG TERM GOAL #4   Title Patient's pain will remain low at a 2/10 or less when using RUE during daily tasks.    Time 8   Period Weeks   Status On-going               Plan - 06/10/16 1223    Clinical Impression Statement A: Added prone exercises to increase scapular strength. Patient with increased difficulty when completing shoulder flexion prone and was with limited ROM. no dificulty with horizontal abduction.    Plan P: Add scapular theraband strengthening exercises. Ok to begin 1# weights supine if able to tolerate.       Patient will benefit from skilled therapeutic intervention in order to  improve the following deficits and impairments:  Decreased  strength, Decreased range of motion, Pain, Increased fascial restricitons  Visit Diagnosis: Other symptoms and signs involving the musculoskeletal system  Acute pain of right shoulder  Stiffness of right shoulder, not elsewhere classified    Problem List Patient Active Problem List   Diagnosis Date Noted  . Coronary artery calcification seen on CT scan 03/22/2016  . 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 Bone metastasis ([facility]) 01/17/2016  . 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 Port catheter in place 04/14/2013  . Adenocarcinoma of left breast 01/27/2011  . Osteoporosis 01/27/2011   Ailene Ravel, OTR/L,CBIS  [phone]  06/10/2016, 12:29 PM  [facility] [address] [place], [place], [address] Phone: [phone]   Fax:  [phone]  Name: RAELEE ALFRED MRN: [id number] Date of Birth: 12-Jan-1951

## 2016-06-12 ENCOUNTER — Encounter (HOSPITAL_COMMUNITY): Payer: BLUE CROSS/BLUE SHIELD | Attending: Adult Health

## 2016-06-12 VITALS — BP 142/71 | HR 66 | Temp 98.0°F | Resp 18

## 2016-06-12 DIAGNOSIS — C7951 Secondary malignant neoplasm of bone: Secondary | ICD-10-CM | POA: Diagnosis present

## 2016-06-12 DIAGNOSIS — C210 Malignant neoplasm of anus, unspecified: Secondary | ICD-10-CM | POA: Diagnosis not present

## 2016-06-12 DIAGNOSIS — Z452 Encounter for adjustment and management of vascular access device: Secondary | ICD-10-CM | POA: Diagnosis not present

## 2016-06-12 LAB — COMPREHENSIVE METABOLIC PANEL
ALT: 16 U/L (ref 14–54)
AST: 22 U/L (ref 15–41)
Albumin: 4.2 g/dL (ref 3.5–5.0)
Alkaline Phosphatase: 77 U/L (ref 38–126)
Anion gap: 7 (ref 5–15)
BUN: 18 mg/dL (ref 6–20)
CO2: 26 mmol/L (ref 22–32)
Calcium: 9 mg/dL (ref 8.9–10.3)
Chloride: 99 mmol/L — ABNORMAL LOW (ref 101–111)
Creatinine, Ser: 0.85 mg/dL (ref 0.44–1.00)
GFR calc Af Amer: >60 mL/min (ref >60)
GFR calc non Af Amer: >60 mL/min (ref >60)
Glucose, Bld: 82 mg/dL (ref 65–99)
Potassium: 3.6 mmol/L (ref 3.5–5.1)
Sodium: 132 mmol/L — ABNORMAL LOW (ref 135–145)
Total Bilirubin: 0.5 mg/dL (ref 0.3–1.2)
Total Protein: 6.9 g/dL (ref 6.5–8.1)

## 2016-06-12 LAB — CBC WITH DIFFERENTIAL/PLATELET
Basophils Absolute: 0 10*3/uL (ref 0.0–0.1)
Basophils Relative: 0 %
Eosinophils Absolute: 0 10*3/uL (ref 0.0–0.7)
Eosinophils Relative: 1 %
HCT: 35.9 % — ABNORMAL LOW (ref 36.0–46.0)
Hemoglobin: 12 g/dL (ref 12.0–15.0)
Lymphocytes Relative: 25 %
Lymphs Abs: 1.7 10*3/uL (ref 0.7–4.0)
MCH: 36 pg — ABNORMAL HIGH (ref 26.0–34.0)
MCHC: 33.4 g/dL (ref 30.0–36.0)
MCV: 107.8 fL — ABNORMAL HIGH (ref 78.0–100.0)
Monocytes Absolute: 0.5 10*3/uL (ref 0.1–1.0)
Monocytes Relative: 8 %
Neutro Abs: 4.6 10*3/uL (ref 1.7–7.7)
Neutrophils Relative %: 66 %
Platelets: 229 10*3/uL (ref 150–400)
RBC: 3.33 MIL/uL — ABNORMAL LOW (ref 3.87–5.11)
RDW: 15.6 % — ABNORMAL HIGH (ref 11.5–15.5)
WBC: 6.8 10*3/uL (ref 4.0–10.5)

## 2016-06-12 MED ORDER — HEPARIN SOD (PORK) LOCK FLUSH 100 UNIT/ML IV SOLN
INTRAVENOUS | Status: AC
  Filled 2016-06-12: qty 5

## 2016-06-12 MED ORDER — SODIUM CHLORIDE 0.9% FLUSH
20.0000 mL | INTRAVENOUS | Status: DC | PRN
  Administered 2016-06-12: 20 mL via INTRAVENOUS
  Filled 2016-06-12: qty 20

## 2016-06-12 MED ORDER — HEPARIN SOD (PORK) LOCK FLUSH 100 UNIT/ML IV SOLN
500.0000 [IU] | Freq: Once | INTRAVENOUS | Status: AC
  Administered 2016-06-12: 500 [IU] via INTRAVENOUS

## 2016-06-12 NOTE — Progress Notes (Signed)
[name] tolerated port lab draw with port flush well without complaints or incident. Port flushed with 20 ml NS and 5 ml Heparin easily per protocol. VSS. Pt discharged self ambulatory in satisfactory condition

## 2016-06-12 NOTE — Patient Instructions (Signed)
[place] at [facility] Discharge Instructions  RECOMMENDATIONS MADE BY THE CONSULTANT AND ANY TEST RESULTS WILL BE SENT TO YOUR REFERRING PHYSICIAN.  Port lab draw with port flushed per protocol today. Follow-up as scheduled. Call clinic for any questions or concerns  Thank you for choosing [facility] at [facility] to provide your oncology and hematology care.  To afford each patient quality time with our provider, please arrive at least 15 minutes before your scheduled appointment time.   Beginning January 23rd 2017 lab work for the [facility] will be done in the  Main lab at [facility] on 1st floor. If you have a lab appointment with the [facility] please come in thru the  Main Entrance and check in at the main information desk  You need to re-schedule your appointment should you arrive 10 or more minutes late.  We strive to give you quality time with our providers, and arriving late affects you and other patients whose appointments are after yours.  Also, if you no show three or more times for appointments you may be dismissed from the clinic at the providers discretion.     Again, thank you for choosing [facility].  Our hope is that these requests will decrease the amount of time that you wait before being seen by our physicians.       _____________________________________________________________  Should you have questions after your visit to [facility], please contact our office at [phone] between the hours of 8:30 a.m. and 4:30 p.m.  Voicemails left after 4:30 p.m. will not be returned until the following business day.  For prescription refill requests, have your pharmacy contact our office.         Resources For Cancer Patients and their Caregivers ? American Cancer Society: Can assist with transportation, wigs, general needs, runs Look Good Feel Better.        586-019-9638 ? Cancer  Care: Provides financial assistance, online support groups, medication/co-pay assistance.  1-800-813-HOPE 414-349-5723) ? [facility] Assists [place] cancer patients and their families through emotional , educational and financial support.  316 313 6035 ? [place] DSS Where to apply for food stamps, Medicaid and utility assistance. 6407198841 ? [facility]: Transportation to medical appointments. 440-767-9110 ? Social Security Administration: May apply for disability if have a Stage IV cancer. (954) 593-6186 (802) 843-3540 ? [employer], Disability and Transit Services: Assists with nutrition, care and transit needs. [facility] Support Programs: @10RELATIVEDAYS @ > Cancer Support Group  2nd Tuesday of the month 1pm-2pm, Journey Room  > Creative Journey  3rd Tuesday of the month 1130am-1pm, Journey Room  > Look Good Feel Better  1st Wednesday of the month 10am-12 noon, Journey Room (Call [facility] to register [phone])

## 2016-06-15 ENCOUNTER — Ambulatory Visit (HOSPITAL_COMMUNITY): Payer: BLUE CROSS/BLUE SHIELD | Admitting: Specialist

## 2016-06-15 ENCOUNTER — Other Ambulatory Visit (HOSPITAL_COMMUNITY): Payer: BLUE CROSS/BLUE SHIELD

## 2016-06-15 DIAGNOSIS — M25611 Stiffness of right shoulder, not elsewhere classified: Secondary | ICD-10-CM

## 2016-06-15 DIAGNOSIS — R29898 Other symptoms and signs involving the musculoskeletal system: Secondary | ICD-10-CM | POA: Diagnosis not present

## 2016-06-15 DIAGNOSIS — M25511 Pain in right shoulder: Secondary | ICD-10-CM

## 2016-06-15 NOTE — Therapy (Signed)
[place], [place], [address] Phone: [phone]   Fax:  [phone]  Occupational Therapy Treatment  Patient Details  Name: [name] MRN: [id number] Date of Birth: 03-Mar-1951 Referring Provider: Sanjuana Kava, MD  Encounter Date: 06/15/2016      OT End of Session - 06/15/16 1504    Visit Number 5   Number of Visits 16   Date for OT Re-Evaluation 07/31/16   Authorization Type BC[employer]   Authorization Time Period 30 visits PT/OT/Chiropractor - 0 used.   Authorization - Visit Number 5   Authorization - Number of Visits 30   OT Start Time [id number]   OT Stop Time 1118   OT Time Calculation (min) 43 min   Activity Tolerance Patient tolerated treatment well   Behavior During Therapy WFL for tasks assessed/performed      Past Medical History:  Diagnosis Date  . 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 Bladder infection   . Bone metastasis ([facility]) 01/17/2016  . Borderline hypertension   . Breast cancer ([facility])   . Closed fracture of humerus sept 2013  . Fatigue   . Generalized headaches   . Osteoporosis 01/27/2011  . Pneumonia   . Port catheter in place 04/14/2013  . Post-mastectomy lymphedema syndrome   . 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 Reflux   . Retinal detachment   . 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 Swollen lymph nodes   . URI (upper respiratory infection) 02/17/12  . Wears glasses     Past Surgical History:  Procedure Laterality Date  . biopsy of lymph node     super clavicle  . BREAST SURGERY    . BTL    . CATARACT EXTRACTION    . cervical disc fusioni    . LYMPH NODE BIOPSY Right 06/19/2015   Procedure: RIGHT NECK LYMPH NODE EXCISION;  Surgeon: Rolm Bookbinder, MD;  Location: [facility];  Service: General;  Laterality: Right;  . MASTECTOMY MODIFIED RADICAL    . PORT-A-CATH REMOVAL     in  place (not removed)  . retinal detachment and repair      There were no vitals filed for this visit.      Subjective Assessment - 06/15/16 1035    Subjective  S:  I think I am ready for weights.  My range is doing really good.   Currently in Pain? Yes   Pain Score 2    Pain Location Shoulder   Pain Orientation Right   Pain Descriptors / Indicators Aching   Pain Type Acute pain            [facility] OT Assessment - 06/15/16 0001      Assessment   Diagnosis Right clavicle fratcure     Precautions   Precautions Shoulder   Type of Shoulder Precautions Standard protocol: (4-8 weeks): full A/ROM/P/ROM by 6 weeks. A/ROM as symptoms and pain permits. No extreme ROM. (8-12 weeks): Full A/ROM in all planes. No heavy lifting. (>12 weeks): advanced strengthening as tolerated.    Precaution Comments Port a Cath under Right clavicle. No manual therapy or ES around port a cath. Per MD notes: active breast and colon cancer.                   OT Treatments/Exercises (OP) - 06/15/16 0001      Exercises  Exercises Shoulder     Shoulder Exercises: Supine   Protraction PROM;5 reps;Strengthening;10 reps   Protraction Weight (lbs) 1   Horizontal ABduction PROM;5 reps;Strengthening;10 reps   Horizontal ABduction Weight (lbs) 1   External Rotation PROM;5 reps;Strengthening;10 reps   External Rotation Weight (lbs) 1   Internal Rotation PROM;5 reps;Strengthening;10 reps   Internal Rotation Weight (lbs) 1   Flexion PROM;5 reps;Strengthening;10 reps   Shoulder Flexion Weight (lbs) 1   ABduction PROM;5 reps;Strengthening;10 reps   Shoulder ABduction Weight (lbs) 1     Shoulder Exercises: Sidelying   External Rotation Strengthening;10 reps   External Rotation Weight (lbs) 1   Internal Rotation Strengthening;10 reps   Internal Rotation Weight (lbs) 1   Flexion Strengthening;10 reps   Flexion Weight (lbs) 1   ABduction Strengthening;10 reps   ABduction Weight (lbs) 1     Shoulder  Exercises: Standing   Horizontal ABduction Theraband;10 reps   Theraband Level (Shoulder Horizontal ABduction) Level 2 (Red)   External Rotation Theraband;10 reps   Theraband Level (Shoulder External Rotation) Level 2 (Red)   Internal Rotation Theraband;10 reps   Theraband Level (Shoulder Internal Rotation) Level 2 (Red)   Extension Theraband;10 reps   Theraband Level (Shoulder Extension) Level 2 (Red)   Row Theraband;10 reps   Theraband Level (Shoulder Row) Level 2 (Red)   Retraction Theraband;10 reps   Theraband Level (Shoulder Retraction) Level 2 (Red)     Shoulder Exercises: ROM/Strengthening   UBE (Upper Arm Bike) 3' forward and 3' reverse at 1.0   "W" Arms 10 times with 1# with tactile cues and verbal cues to focus on scapular stability   X to V Arms 10 times with 1# with verbal and tactile cuing for technique and for scapular input   Proximal Shoulder Strengthening, Supine 10 times with 1# resistance without resting   Ball on Wall 1' wtih arm flexed to 90 and 1' with arm abducted to 90     Manual Therapy   Manual Therapy Myofascial release   Manual therapy comments Manual therapy completed prior to exercises.    Myofascial Release Myofascial release and manual stretching                OT Education - 06/15/16 1504    Education Details strengthening shoulder supine   Person(s) Educated Patient   Methods Explanation;Demonstration   Comprehension Verbalized understanding;Returned demonstration          OT Short Term Goals - 06/04/16 1109      OT SHORT TERM GOAL #1   Title Patient will be educated and independent with HEP to increase functional use of RUE during daily tasks.    Time 4   Period Weeks   Status On-going     OT SHORT TERM GOAL #2   Title Patient will increase A/ROM in RUE to WNL to increase ability to complete reaching activities for work.   Time 4   Period Weeks   Status On-going     OT SHORT TERM GOAL #3   Title Patient will increase RUE  strength to 3+/5 to increase ability to complete lightweight lifting activities.    Time 4   Period Weeks   Status On-going           OT Long Term Goals - 06/04/16 1109      OT LONG TERM GOAL #1   Title Patient will return to highest level of independence with all daily and work related tasks.   Time 8  Period Weeks   Status On-going     OT LONG TERM GOAL #2   Title Patient will decrease fascial restrictions on trace amount in RUE to increase functional mobility needed for high level reaching.    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   Time 8   Period Weeks   Status On-going     OT LONG TERM GOAL #4   Title Patient's pain will remain low at a 2/10 or less when using RUE during daily tasks.    Time 8   Period Weeks   Status On-going               Plan - 06/15/16 1505    Clinical Impression Statement A:  transitioned from prone to sidelying for improved isolation of range while strengthening scapula.  focused on scapular stability as right scapula is winged 50% greater than left.    Plan P:  Added scapular tband to HEP.  Attempt 2# strengthening.    OT Home Exercise Plan 10/23: AA/ROM exercises. 10/26: Stop AA/ROM and start A/ROM shoulder exercises. 10/30:  tendon glides and red theraputty  11/06:  strengthening with 1# supine      Patient will benefit from skilled therapeutic intervention in order to improve the following deficits and impairments:  Decreased strength, Decreased range of motion, Pain, Increased fascial restricitons  Visit Diagnosis: Other symptoms and signs involving the musculoskeletal system  Acute pain of right shoulder  Stiffness of right shoulder, not elsewhere classified    Problem List Patient Active Problem List   Diagnosis Date Noted  . Coronary artery calcification seen on CT scan  03/22/2016  . 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 Bone metastasis ([facility]) 01/17/2016  . 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 Port catheter in place 04/14/2013  . Adenocarcinoma of left breast 01/27/2011  . Osteoporosis 01/27/2011    Vangie Bicker, [place], OTR/L [phone]  06/15/2016, 3:08 PM  [place], [place], 05/17/1951 Phone: [phone]   Fax:  [phone]  Name: [name] MRN: [id number] Date of Birth: 1951-01-18

## 2016-06-16 ENCOUNTER — Encounter (HOSPITAL_COMMUNITY): Payer: Self-pay | Admitting: Oncology

## 2016-06-16 ENCOUNTER — Encounter (HOSPITAL_COMMUNITY): Payer: BLUE CROSS/BLUE SHIELD | Attending: Oncology | Admitting: Oncology

## 2016-06-16 ENCOUNTER — Encounter (HOSPITAL_BASED_OUTPATIENT_CLINIC_OR_DEPARTMENT_OTHER): Payer: BLUE CROSS/BLUE SHIELD

## 2016-06-16 VITALS — BP 129/61 | HR 72 | Temp 98.6°F | Resp 16 | Ht 64.0 in | Wt 137.0 lb

## 2016-06-16 DIAGNOSIS — Z23 Encounter for immunization: Secondary | ICD-10-CM

## 2016-06-16 DIAGNOSIS — C210 Malignant neoplasm of anus, unspecified: Secondary | ICD-10-CM | POA: Diagnosis not present

## 2016-06-16 DIAGNOSIS — C7951 Secondary malignant neoplasm of bone: Secondary | ICD-10-CM

## 2016-06-16 DIAGNOSIS — C50919 Malignant neoplasm of unspecified site of unspecified female breast: Secondary | ICD-10-CM

## 2016-06-16 DIAGNOSIS — C50912 Malignant neoplasm of unspecified site of left female breast: Secondary | ICD-10-CM | POA: Diagnosis not present

## 2016-06-16 MED ORDER — INFLUENZA VAC SPLIT QUAD 0.5 ML IM SUSY
0.5000 mL | PREFILLED_SYRINGE | Freq: Once | INTRAMUSCULAR | Status: AC
  Administered 2016-06-16: 0.5 mL via INTRAMUSCULAR
  Filled 2016-06-16: qty 0.5

## 2016-06-16 MED ORDER — DENOSUMAB 120 MG/1.7ML ~~LOC~~ SOLN
120.0000 mg | Freq: Once | SUBCUTANEOUS | Status: AC
  Administered 2016-06-16: 120 mg via SUBCUTANEOUS
  Filled 2016-06-16: qty 1.7

## 2016-06-16 NOTE — Progress Notes (Signed)
[name] presents today for injection per MD orders. X-geva 120mg  administered SQ in right Abdomen. Administration without incident. Patient tolerated well.  Flu shot given, see MAR for details.

## 2016-06-16 NOTE — Assessment & Plan Note (Addendum)
Squamous cell carcinoma of anus, HPV POSITIVE, S/P XRT with Xeloda concomitantly in the neoadjuvant setting (02/07/2016- 03/10/2016).  Initial medical oncology recommendations provided by Dr. Tressie Stalker at [facility].  Dr. Tressie Stalker does dictate that there is a history of anal intercourse in the remote past.  Oncology history updated.  Patient is educated that a partial response on PET imaging at this time does not necessarily mean failure of treatment.  It is not uncommon to continue to see regression of disease even up to 26 weeks post treatment according to ACT-II study.  She has seen Dr. Leighton Ruff in follow-up since completion of therapy.  She performed an exam in her office.  She noted some inflammation at the site of treatment.  At follow-up appointment, will plan for further PET restaging.  I do not think it would be unreasonable to perform PET imaging at the beginning of Jan 2018 (3 months from her previous PET).  PET imaging is necessary given that CT imaging in past was not helpful with regards to imaging for her anal cancer.

## 2016-06-16 NOTE — Patient Instructions (Signed)
[place] at [facility] Discharge Instructions  RECOMMENDATIONS MADE BY THE CONSULTANT AND ANY TEST RESULTS WILL BE SENT TO YOUR REFERRING PHYSICIAN.  Xgeva given today, flu shot given today. Follow up as scheduled.  Thank you for choosing [place] at [facility] to provide your oncology and hematology care.  To afford each patient quality time with our provider, please arrive at least 15 minutes before your scheduled appointment time.   Beginning January 23rd 2017 lab work for the [facility] will be done in the  Main lab at [facility] on 1st floor. If you have a lab appointment with the [facility] please come in thru the  Main Entrance and check in at the main information desk  You need to re-schedule your appointment should you arrive 10 or more minutes late.  We strive to give you quality time with our providers, and arriving late affects you and other patients whose appointments are after yours.  Also, if you no show three or more times for appointments you may be dismissed from the clinic at the providers discretion.     Again, thank you for choosing [facility].  Our hope is that these requests will decrease the amount of time that you wait before being seen by our physicians.       _____________________________________________________________  Should you have questions after your visit to [facility], please contact our office at [phone] between the hours of 8:30 a.m. and 4:30 p.m.  Voicemails left after 4:30 p.m. will not be returned until the following business day.  For prescription refill requests, have your pharmacy contact our office.         Resources For Cancer Patients and their Caregivers ? American Cancer Society: Can assist with transportation, wigs, general needs, runs Look Good Feel Better.        989-299-7940 ? Cancer Care: Provides financial assistance, online support  groups, medication/co-pay assistance.  1-800-813-HOPE 4148763366) ? [facility] Assists [place] cancer patients and their families through emotional , educational and financial support.  8181765913 ? [place] DSS Where to apply for food stamps, Medicaid and utility assistance. 619-490-5742 ? [facility]: Transportation to medical appointments. 802 326 5206 ? Social Security Administration: May apply for disability if have a Stage IV cancer. 620-499-4347 620-312-9099 ? [employer], Disability and Transit Services: Assists with nutrition, care and transit needs. [place] Support Programs: @10RELATIVEDAYS @ > Cancer Support Group  2nd Tuesday of the month 1pm-2pm, Journey Room  > Creative Journey  3rd Tuesday of the month 1130am-1pm, Journey Room  > Look Good Feel Better  1st Wednesday of the month 10am-12 noon, Journey Room (Call [facility] to register [phone])

## 2016-06-16 NOTE — Progress Notes (Signed)
Jennifer Neighbors, MD [place] [address]  Adenocarcinoma of breast, unspecified laterality [facility]) - Plan: CBC with Differential, Comprehensive metabolic panel, Cancer antigen 27.29, Cancer antigen 15-3  Squamous cell carcinoma of anus (HCC) - Plan: CBC with Differential, Comprehensive metabolic panel  Bone metastasis (HCC) - Plan: Comprehensive metabolic panel  CURRENT THERAPY: Xeloda 1300 mg BID 7 days on and 7 days off beginning on 04/20/2016.  INTERVAL HISTORY: [name] 64 y.o. female returns for followup of Stage IV breast cancer ER/PR positive, HER-2/neu negative. BRCA2 POSITIVE. History of stage IIIc left breast cancer in 2009 treated with neoadjuvant chemotherapy consisting of epirubicin and Cytoxan followed by 4 cycles of docetaxel from 04/16/2008-08/13/2008 with the initiation of Herceptin for 52 weeks at the start of docetaxel. She then underwent a left mastectomy on 08/30/2008 showing persistent disease in the breast and lymph nodes. This was followed by radiation therapy from 09/17/2008-11/26/2008 to the left chest wall, supraclavicular, and axillary node areas. She then moved on to antiestrogen therapy with letrozole (11/27/2008-04/20/2011). She was found to have recurrent breast cancer on supraclavicular lymph node biopsy on 03/30/2011 leading to a change in antiestrogen therapy to tamoxifen (04/21/2011-06/04/2014). Pet imaging on 05/25/2014 demonstrated progression of left submandibular lymph node, mediastinal lymph node, and probable lung involvement. This was followed by a biopsy on 06/13/2014 of a left supraclavicular mass that did reveal metastatic carcinoma consistent with breast primary. She was then treated with carboplatin and Taxol for 6 cycles with an excellent response (06/25/2014-10/09/2014). She was then transition back to hormone therapy consisting of Ibrance and Faslodex (11/13/2014-06/10/2015). This was complicated by grade 3 neutropenia  with cycle #2 resulting in a dose reduction of Ibrance to 100 mg for cycle 3. Unfortunately, on 06/10/2015, she was noted to have progression of disease in the right mid cervical lymph node over a 4 day period that was PET avid. Therapy was therefore changed to exemestane and everolimus (48/18/5631-49/70/2637) that was complicated by an adverse reaction consisting of diffuse pneumonitis. This regimen was therefore discontinued. She then transitioned back to systemic chemotherapy consisting of cisplatin every 14 days (08/23/2015-12/23/2015). She was put on a drug holiday beginning on 12/23/2015 in preparation for a trip to [place] on 01/25/2016.  She is now on Xeloda maintenance in a 7 day on and 7 day off fashion. AND squamous cell carcinoma of anus, HPV POSITIVE, S/P XRT with Xeloda concomitantly in the neoadjuvant setting (02/07/2016- 03/10/2016).    Adenocarcinoma of left breast   04/09/2008 Pathology Results    Positive for breast cancer, ER 100%, PR 5%, HER-2/neu POSITIVE.      04/09/2008 Procedure    Biopsy of left axillary node mass       04/16/2008 - 08/13/2008 Chemotherapy    Dose dense epirubicin/Cytoxan 4 cycles followed by docetaxel 4 cycles. Herceptin initiated with docetaxel for a total of 52 weeks.      08/30/2008 Definitive Surgery    Left mastectomy revealing persistence of disease in the breast and lymph nodes.      09/17/2008 - 11/26/2008 Radiation Therapy    Radiation to left chest wall, supraclavicular, and axillary nodal areas.      11/27/2008 - 04/20/2011 Anti-estrogen oral therapy    Letrozole      03/30/2011 Procedure    Left supraclavicular lymph node biopsy      03/30/2011 Pathology Results    Recurrent breast cancer, invasive ductal type, ER 100%, PR 40%, HER-2 NEGATIVE  04/21/2011 - 06/04/2014 Anti-estrogen oral therapy    Tamoxifen      05/25/2014 PET scan    Progression in left submandibular lymph node, mediastinal lymph nodes, and probable lung  involvement.      05/25/2014 Progression    PET scan demonstrates progression of disease.      06/13/2014 Procedure    Left supraclavicular mass biopsy      06/13/2014 Pathology Results    Metastatic carcinoma consistent with breast primary, involving skeletal muscle, perineural invasion, LVI, no lymphoid tissue identified, ER 98%, PR negative, HER-2 negative      06/25/2014 - 10/09/2014 Chemotherapy    Carboplatin/Taxol 6 cycles with excellent response      11/06/2014 - 06/10/2015 Chemotherapy    Palbociclib and Faslodex      01/08/2015 Adverse Reaction    Grade 3 neutropenia with cycle #2 of Palbociclib.      01/08/2015 Treatment Plan Change    Palbociclib dose reduced to 100 mg for cycle #3.      06/10/2015 Progression    Right cervical lymph node progression clinically developed over 4 days.  This lymph node was positive on PET scan in September 2016 and at that time was approximately 8 mm with possible progression in a single bone metastasis.      06/17/2015 Procedure    Biopsy of right cervical lymph node      06/17/2015 Pathology Results    Genetic sequencing performed revealing BRCA2 positivity      07/02/2015 - 08/01/2015 Chemotherapy    Exemestane/everolimus, 25 mg/ 5 mg x 7 days then increasing to 10 mg daily.      08/01/2015 PET scan    Diffuse pneumonitis.        08/01/2015 Adverse Reaction    Pneumonitis secondary to drug reaction from everolimus.      08/23/2015 - 12/23/2015 Chemotherapy    Cisplatin 40 mg/m every other week      11/15/2015 PET scan    Interval resolution of hypermetabolic airspace opacities in the lungs which were likely inflammatory. Abnormal high activity at the anal rectal junction. Very low grade residual activity at the site of L3 metastatic lesion.      12/23/2015 Treatment Plan Change    Holiday from treatment in preparation for trip to [place].      04/20/2016 -  Chemotherapy    XELODA 1300 mg po bid 7 days on/ 7 days  off       05/15/2016 PET scan    1. Partial metabolic response at the hypermetabolic anorectal junction malignancy. 2. No hypermetabolic metastatic disease. Lumbar vertebral sclerotic osseous lesions are non hypermetabolic. 3. Hypermetabolism associated with acute/early subacute fractures of the right clavicle and right anterior second through fifth ribs, without associated bone lesions. 4. Aortic atherosclerosis.  Three-vessel coronary atherosclerosis.       Squamous cell carcinoma of anus ([facility])   12/31/2015 Procedure    Colonoscopy by Dr. Britta Mccreedy      12/31/2015 Pathology Results    Invasive squamous cell carcinoma approximate 4 cm mass posterior wall of rectum      01/13/2016 Treatment Plan Change    Dr. Leighton Ruff consultation- no surgery.  Recommend chemoXRT      01/16/2016 Pathology Results    HPV POSITIVE      02/07/2016 - 03/10/2016 Chemotherapy    Xeloda 1450 mg PO BID Sunday PM- Friday AM during XRT      02/07/2016 - 03/10/2016 Radiation Therapy    Definitive XRT  02/19/2016 Treatment Plan Change    Xeloda on hold due to increased GERD symtpoms.  PPI dose increased.  Will restart Xeloda tomorrow, 02/21/2016.      02/23/2016 Treatment Plan Change    Xeloda dose decreased to 1150 mg PO BID 'Sunday PM- Friday AM during XRT      10' /01/2016 PET scan    1. Partial metabolic response at the hypermetabolic anorectal junction malignancy.        She is doing well.  She is seeing PT/OT for her right clavicular fracture for rehabilitation.  She denies any abnormal pain.  She denies any changes in her bowel habits.  She denies any diarrhea, stomatitis, and palmar-plantar erythrodysesthesia.  Review of Systems  Constitutional: Negative.  Negative for chills, fever and weight loss.  HENT: Negative.   Eyes: Negative.   Respiratory: Negative.  Negative for cough.   Cardiovascular: Negative.  Negative for chest pain.  Gastrointestinal: Negative.  Negative for blood  in stool, diarrhea, melena, nausea and vomiting.  Genitourinary: Negative.  Negative for dysuria.  Musculoskeletal: Negative.   Skin: Negative.  Negative for rash.  Neurological: Negative.  Negative for weakness.  Endo/Heme/Allergies: Negative.   Psychiatric/Behavioral: Negative.     Past Medical History:  Diagnosis Date  . Adenocarcinoma of breast ([facility]) 01/27/2011   stage IIIc breast cancer  . Bladder infection   . Bone metastasis ([facility]) 01/17/2016  . Borderline hypertension   . Breast cancer ([facility])   . Closed fracture of humerus sept 2013  . Fatigue   . Generalized headaches   . Osteoporosis 01/27/2011  . Pneumonia   . Port catheter in place 04/14/2013  . Post-mastectomy lymphedema syndrome   . 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 Reflux   . Retinal detachment   . 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 Swollen lymph nodes   . URI (upper respiratory infection) 02/17/12  . Wears glasses     Past Surgical History:  Procedure Laterality Date  . biopsy of lymph node     super clavicle  . BREAST SURGERY    . BTL    . CATARACT EXTRACTION    . cervical disc fusioni    . LYMPH NODE BIOPSY Right 06/19/2015   Procedure: RIGHT NECK LYMPH NODE EXCISION;  Surgeon: Rolm Bookbinder, MD;  Location: [facility];  Service: General;  Laterality: Right;  . MASTECTOMY MODIFIED RADICAL    . PORT-A-CATH REMOVAL     in place (not removed)  . retinal detachment and repair      Family History  Problem Relation Age of Onset  . Hypertension Mother   . Heart disease Mother     atrial fib  . Heart disease Father   . Other Father     glaucoma    Social History   Social History  . Marital status: Divorced    Spouse name: N/A  . Number of children: N/A  . Years of education: N/A   Social History Main Topics  . Smoking status: Never Smoker  . Smokeless tobacco: Never Used  . Alcohol use Yes     Comment: not at present  time due to chemo treatment  . Drug use: No  . Sexual activity: Not Asked   Other Topics Concern  . None   Social History Narrative  . None     PHYSICAL EXAMINATION  ECOG PERFORMANCE STATUS: 0 - Asymptomatic  Vitals:   06/16/16 0944  BP: 129/61  Pulse: 72  Resp: 16  Temp: 98.6 F (37 C)    GENERAL:alert, no distress, well nourished, well developed, comfortable, cooperative, smiling and unaccompanied SKIN: skin color, texture, turgor are normal, no rashes or significant lesions HEAD: Normocephalic, No masses, lesions, tenderness or abnormalities EYES: normal, EOMI, Conjunctiva are pink and non-injected EARS: External ears normal OROPHARYNX:lips, buccal mucosa, and tongue normal and mucous membranes are moist  NECK: supple, trachea midline LYMPH:  not examined BREAST:not examined LUNGS: clear to auscultation and percussion HEART: regular rate & rhythm, no murmurs, no gallops, S1 normal and S2 normal ABDOMEN:abdomen soft, non-tender and normal bowel sounds BACK: Back symmetric, no curvature. EXTREMITIES:less then 2 second capillary refill, no joint deformities, effusion, or inflammation, no edema, no skin discoloration, no clubbing, no cyanosis.  POSITIVE: right prominence of clavicule at the medial 1/3. NEURO: alert & oriented x 3 with fluent speech, no focal motor/sensory deficits, gait normal   LABORATORY DATA: CBC    Component Value Date/Time   WBC 6.8 06/12/2016 1432   RBC 3.33 (L) 06/12/2016 1432   HGB 12.0 06/12/2016 1432   HCT 35.9 (L) 06/12/2016 1432   PLT 229 06/12/2016 1432   MCV 107.8 (H) 06/12/2016 1432   MCH 36.0 (H) 06/12/2016 1432   MCHC 33.4 06/12/2016 1432   RDW 15.6 (H) 06/12/2016 1432   LYMPHSABS 1.7 06/12/2016 1432   MONOABS 0.5 06/12/2016 1432   EOSABS 0.0 06/12/2016 1432   BASOSABS 0.0 06/12/2016 1432      Chemistry      Component Value Date/Time   NA 132 (L) 06/12/2016 1432   K 3.6 06/12/2016 1432   CL 99 (L) 06/12/2016 1432   CO2  26 06/12/2016 1432   BUN 18 06/12/2016 1432   CREATININE 0.85 06/12/2016 1432      Component Value Date/Time   CALCIUM 9.0 06/12/2016 1432   ALKPHOS 77 06/12/2016 1432   AST 22 06/12/2016 1432   ALT 16 06/12/2016 1432   BILITOT 0.5 06/12/2016 1432        PENDING LABS:   RADIOGRAPHIC STUDIES:  Dg Clavicle Right  Result Date: 05/26/2016 Clinical:  History of fracture of the right clavicle X-rays were done of the right clavicle, two views. There is a port-a-cath in place on the right.  There is a fracture displaced of the right clavicle with bayonet positioning and early callus formation.  Impression:  Displaced healing fracture right midshaft clavicle. Electronically Signed Sanjuana Kava, MD 10/17/20179:55 AM     PATHOLOGY:    ASSESSMENT AND PLAN:  Adenocarcinoma of left breast Stage IV breast cancer ER/PR positive, HER-2/neu negative. BRCA2 POSITIVE.  History of stage IIIc left breast cancer in 2009 treated with neoadjuvant chemotherapy consisting of epirubicin and Cytoxan followed by 4 cycles of docetaxel from 04/16/2008-08/13/2008 with the initiation of Herceptin for 52 weeks at the start of docetaxel. She then underwent a left mastectomy on 08/30/2008 showing persistent disease in the breast and lymph nodes. This was followed by radiation therapy from 09/17/2008-11/26/2008 to the left chest wall, supraclavicular, and axillary node areas. She then moved on to antiestrogen therapy with letrozole (11/27/2008-04/20/2011). She was found to have recurrent breast cancer on supraclavicular lymph node biopsy on 03/30/2011 leading to a change in antiestrogen therapy to tamoxifen (04/21/2011-06/04/2014). Pet imaging on 05/25/2014 demonstrated progression of left submandibular lymph node, mediastinal lymph node, and probable lung involvement. This was followed by a biopsy on 06/13/2014 of a left supraclavicular mass that did reveal metastatic carcinoma consistent  with breast primary. She was  then treated with carboplatin and Taxol for 6 cycles with an excellent response (06/25/2014-10/09/2014). She was then transition back to hormone therapy consisting of Ibrance and Faslodex (11/13/2014-06/10/2015). This was complicated by grade 3 neutropenia with cycle #2 resulting in a dose reduction of Ibrance to 100 mg for cycle 3. Unfortunately, on 06/10/2015, she was noted to have progression of disease in the right mid cervical lymph node over a 4 day period that was PET avid. Therapy was therefore changed to exemestane and everolimus (34/19/6222-97/98/9211) that was complicated by an adverse reaction consisting of diffuse pneumonitis. This regimen was therefore discontinued. She then transitioned back to systemic chemotherapy consisting of cisplatin every 14 days (08/23/2015-12/23/2015). She was put on a drug holiday beginning on 12/23/2015 in preparation for a trip to [place] on 01/25/2016.  She is now on Xeloda maintenance in a 7 day on and 7 day off fashion beginning on 04/20/2016.  Oncology history is updated.  She has seen Dr. Mauro Kaufmann Muss in the past for second opinion.  Labs performed on 06/12/2016 with port flush: CBC diff, CMET.  I personally reviewed and went over laboratory results with the patient.  The results are noted within this dictation.  Macrocytosis without anemia is noted; likely secondary to Xeloda.  Labs in 4 weeks: CBC diff, CMET, CA 27.29, and CA15-3  She started her Xeloda today, 06/16/2016.  Right clavicular fracture is followed by Dr. Luna Glasgow.  She is now undergoing physical therapy/occupational therapy.  Influenza immunization is provided today.  Return in 4 weeks for follow-up, labs, and Xgeva.  She knows to call us with any questions or concerns, particularly for any possible toxicities.   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  Initial  medical oncology recommendations provided by Dr. Tressie Stalker at [facility].  Dr. Tressie Stalker does dictate that there is a history of anal intercourse in the remote past.  Oncology history updated.  Patient is educated that a partial response on PET imaging at this time does not necessarily mean failure of treatment.  It is not uncommon to continue to see regression of disease even up to 26 weeks post treatment according to ACT-II study.  She has seen Dr. Leighton Ruff in follow-up since completion of therapy.  She performed an exam in her office.  She noted some inflammation at the site of treatment.  At follow-up appointment, will plan for further PET restaging.  I do not think it would be unreasonable to perform PET imaging at the beginning of Jan 2018 (3 months from her previous PET).  PET imaging is necessary given that CT imaging in past was not helpful with regards to imaging for her anal cancer.  Bone metastasis (HCC) Bone metastases from breast cancer. On Xgeva therapy. She is also taking calcium and vitamin D.  Delton See is due today.  Calcium is WNL today and therefore meet parameters for this supportive therapy.    Supportive therapy plan is reviewed.  Compliance with Ca++ encouraged.  She is also taking TUMS BID-TID for additional Ca++ supplementation.   ORDERS PLACED FOR THIS ENCOUNTER: Orders Placed This Encounter  Procedures  . CBC with Differential  . Comprehensive metabolic panel  . Cancer antigen 27.29  . Cancer antigen 15-3    MEDICATIONS PRESCRIBED THIS ENCOUNTER: No orders of the defined types were placed in this encounter.   THERAPY PLAN:  Continue Xeloda  maintenance therapy.  All questions were answered. The patient knows to call the clinic with any problems, questions or concerns. We can certainly see the patient much sooner if necessary.  Patient and plan discussed with Dr. Ancil Linsey and she is in agreement with the aforementioned.   This note is  electronically signed by: Doy Mince 06/16/2016 10:18 AM

## 2016-06-16 NOTE — Assessment & Plan Note (Addendum)
Stage IV breast cancer ER/PR positive, HER-2/neu negative. BRCA2 POSITIVE.  History of stage IIIc left breast cancer in 2009 treated with neoadjuvant chemotherapy consisting of epirubicin and Cytoxan followed by 4 cycles of docetaxel from 04/16/2008-08/13/2008 with the initiation of Herceptin for 52 weeks at the start of docetaxel. She then underwent a left mastectomy on 08/30/2008 showing persistent disease in the breast and lymph nodes. This was followed by radiation therapy from 09/17/2008-11/26/2008 to the left chest wall, supraclavicular, and axillary node areas. She then moved on to antiestrogen therapy with letrozole (11/27/2008-04/20/2011). She was found to have recurrent breast cancer on supraclavicular lymph node biopsy on 03/30/2011 leading to a change in antiestrogen therapy to tamoxifen (04/21/2011-06/04/2014). Pet imaging on 05/25/2014 demonstrated progression of left submandibular lymph node, mediastinal lymph node, and probable lung involvement. This was followed by a biopsy on 06/13/2014 of a left supraclavicular mass that did reveal metastatic carcinoma consistent with breast primary. She was then treated with carboplatin and Taxol for 6 cycles with an excellent response (06/25/2014-10/09/2014). She was then transition back to hormone therapy consisting of Ibrance and Faslodex (11/13/2014-06/10/2015). This was complicated by grade 3 neutropenia with cycle #2 resulting in a dose reduction of Ibrance to 100 mg for cycle 3. Unfortunately, on 06/10/2015, she was noted to have progression of disease in the right mid cervical lymph node over a 4 day period that was PET avid. Therapy was therefore changed to exemestane and everolimus (83/43/7357-89/78/4784) that was complicated by an adverse reaction consisting of diffuse pneumonitis. This regimen was therefore discontinued. She then transitioned back to systemic chemotherapy consisting of cisplatin every 14 days (08/23/2015-12/23/2015). She was put on a  drug holiday beginning on 12/23/2015 in preparation for a trip to [place] on 01/25/2016.  She is now on Xeloda maintenance in a 7 day on and 7 day off fashion beginning on 04/20/2016.  Oncology history is updated.  She has seen Dr. Mauro Kaufmann Muss in the past for second opinion.  Labs performed on 06/12/2016 with port flush: CBC diff, CMET.  I personally reviewed and went over laboratory results with the patient.  The results are noted within this dictation.  Macrocytosis without anemia is noted; likely secondary to Xeloda.  Labs in 4 weeks: CBC diff, CMET, CA 27.29, and CA15-3  She started her Xeloda today, 06/16/2016.  Right clavicular fracture is followed by Dr. Luna Glasgow.  She is now undergoing physical therapy/occupational therapy.  Influenza immunization is provided today.  Return in 4 weeks for follow-up, labs, and Xgeva.  She knows to call us with any questions or concerns, particularly for any possible toxicities.

## 2016-06-16 NOTE — Assessment & Plan Note (Signed)
Bone metastases from breast cancer. On Xgeva therapy. She is also taking calcium and vitamin D.  Delton See is due today.  Calcium is WNL today and therefore meet parameters for this supportive therapy.    Supportive therapy plan is reviewed.  Compliance with Ca++ encouraged.  She is also taking TUMS BID-TID for additional Ca++ supplementation.

## 2016-06-16 NOTE — Patient Instructions (Addendum)
[place] at [facility] Discharge Instructions  RECOMMENDATIONS MADE BY THE CONSULTANT AND ANY TEST RESULTS WILL BE SENT TO YOUR REFERRING PHYSICIAN.  You were seen today by Kirby Crigler PA-C. Continue Xeloda as planned. Flu shot given today. Return in 4 weeks for labs, Xgeva and follow up.    Thank you for choosing [place] at [facility] to provide your oncology and hematology care.  To afford each patient quality time with our provider, please arrive at least 15 minutes before your scheduled appointment time.   Beginning January 23rd 2017 lab work for the [facility] will be done in the  Main lab at [facility] on 1st floor. If you have a lab appointment with the [facility] please come in thru the  Main Entrance and check in at the main information desk  You need to re-schedule your appointment should you arrive 10 or more minutes late.  We strive to give you quality time with our providers, and arriving late affects you and other patients whose appointments are after yours.  Also, if you no show three or more times for appointments you may be dismissed from the clinic at the providers discretion.     Again, thank you for choosing [facility].  Our hope is that these requests will decrease the amount of time that you wait before being seen by our physicians.       _____________________________________________________________  Should you have questions after your visit to [facility], please contact our office at [phone] between the hours of 8:30 a.m. and 4:30 p.m.  Voicemails left after 4:30 p.m. will not be returned until the following business day.  For prescription refill requests, have your pharmacy contact our office.         Resources For Cancer Patients and their Caregivers ? American Cancer Society: Can assist with transportation, wigs, general needs, runs Look Good Feel Better.         414-457-3214 ? Cancer Care: Provides financial assistance, online support groups, medication/co-pay assistance.  1-800-813-HOPE (639) 176-6114) ? [facility] [place] Assists [place] cancer patients and their families through emotional , educational and financial support.  351 860 3936 ? [place] DSS Where to apply for food stamps, Medicaid and utility assistance. 256-611-1359 ? [facility]: Transportation to medical appointments. 5416084435 ? Social Security Administration: May apply for disability if have a Stage IV cancer. (980)102-9041 413-607-2819 ? [employer], Disability and Transit Services: Assists with nutrition, care and transit needs. [place] Support Programs: @10RELATIVEDAYS @ > Cancer Support Group  2nd Tuesday of the month 1pm-2pm, Journey Room  > Creative Journey  3rd Tuesday of the month 1130am-1pm, Journey Room  > Look Good Feel Better  1st Wednesday of the month 10am-12 noon, Journey Room (Call [facility] to register [phone])

## 2016-06-17 ENCOUNTER — Ambulatory Visit (HOSPITAL_COMMUNITY): Payer: BLUE CROSS/BLUE SHIELD

## 2016-06-17 DIAGNOSIS — R29898 Other symptoms and signs involving the musculoskeletal system: Secondary | ICD-10-CM

## 2016-06-17 DIAGNOSIS — M25611 Stiffness of right shoulder, not elsewhere classified: Secondary | ICD-10-CM

## 2016-06-17 DIAGNOSIS — M25511 Pain in right shoulder: Secondary | ICD-10-CM

## 2016-06-17 NOTE — Therapy (Signed)
[place], [place], [address] Phone: [phone]   Fax:  [phone]  Occupational Therapy Treatment  Patient Details  Name: [name] MRN: [id number] Date of Birth: 04-15-1951 Referring Provider: Sanjuana Kava, MD  Encounter Date: 06/17/2016      OT End of Session - 06/17/16 1338    Visit Number 6   Number of Visits 16   Date for OT Re-Evaluation 07/31/16   Authorization Type BCBS   Authorization Time Period 30 visits PT/OT/Chiropractor - 0 used.   Authorization - Visit Number 6   Authorization - Number of Visits 30   OT Start Time [id number]   OT Stop Time 1345   OT Time Calculation (min) 42 min   Activity Tolerance Patient tolerated treatment well   Behavior During Therapy WFL for tasks assessed/performed      Past Medical History:  Diagnosis Date  . 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 Bladder infection   . Bone metastasis ([facility]) 01/17/2016  . Borderline hypertension   . Breast cancer ([facility])   . Closed fracture of humerus sept 2013  . Fatigue   . Generalized headaches   . Osteoporosis 01/27/2011  . Pneumonia   . Port catheter in place 04/14/2013  . Post-mastectomy lymphedema syndrome   . 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 Reflux   . Retinal detachment   . 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 Swollen lymph nodes   . URI (upper respiratory infection) 02/17/12  . Wears glasses     Past Surgical History:  Procedure Laterality Date  . biopsy of lymph node     super clavicle  . BREAST SURGERY    . BTL    . CATARACT EXTRACTION    . cervical disc fusioni    . LYMPH NODE BIOPSY Right 06/19/2015   Procedure: RIGHT NECK LYMPH NODE EXCISION;  Surgeon: Rolm Bookbinder, MD;  Location: [facility];  Service: General;  Laterality: Right;  . MASTECTOMY MODIFIED RADICAL    . PORT-A-CATH REMOVAL     in  place (not removed)  . retinal detachment and repair      There were no vitals filed for this visit.      Subjective Assessment - 06/17/16 1340    Subjective  S: I got my flu shot and it's a little sore from that.    Currently in Pain? Yes   Pain Score 3    Pain Location Shoulder   Pain Orientation Right   Pain Descriptors / Indicators Aching;Burning   Pain Type Acute pain   Pain Radiating Towards N/A   Pain Onset More than a month ago   Pain Frequency Constant   Aggravating Factors  quick movements   Pain Relieving Factors rest   Effect of Pain on Daily Activities min-mod effect   Multiple Pain Sites No                      OT Treatments/Exercises (OP) - 06/17/16 1320      Exercises   Exercises Shoulder     Shoulder Exercises: Supine   Protraction PROM;5 reps;Strengthening;12 reps   Protraction Weight (lbs) 2   Horizontal ABduction PROM;5 reps;Strengthening;12 reps   Horizontal ABduction Weight (lbs) 2   External Rotation PROM;5 reps;Strengthening;12 reps   External Rotation Limitations 2   Internal Rotation PROM;5 reps;Strengthening;12  reps   Internal Rotation Weight (lbs) 2   Flexion PROM;5 reps;Strengthening;12 reps   Shoulder Flexion Weight (lbs) 2     Shoulder Exercises: Standing   Protraction Strengthening;12 reps   Protraction Weight (lbs) 2   Horizontal ABduction Strengthening;12 reps   Horizontal ABduction Weight (lbs) 2   External Rotation Strengthening;12 reps   External Rotation Weight (lbs) 2   Internal Rotation Strengthening;12 reps   Internal Rotation Weight (lbs) 2   Flexion Strengthening;12 reps   Shoulder Flexion Weight (lbs) 2   ABduction Strengthening;12 reps   Shoulder ABduction Weight (lbs) 2   Extension Theraband;10 reps   Theraband Level (Shoulder Extension) Level 2 (Red)   Row Theraband;10 reps   Theraband Level (Shoulder Row) Level 2 (Red)   Retraction Theraband;10 reps   Theraband Level (Shoulder Retraction) Level  2 (Red)     Shoulder Exercises: ROM/Strengthening   UBE (Upper Arm Bike) 3' forward and 3' reverse at 1.0   X to V Arms 10X with 2#    Proximal Shoulder Strengthening, Supine 12 times with 2# resistance without resting   Proximal Shoulder Strengthening, Seated 12X with 2# no rest breaks   Ball on Wall 1' wtih arm flexed to 90 and 1' with arm abducted to 90     Manual Therapy   Manual Therapy Myofascial release   Manual therapy comments Manual therapy completed prior to exercises.    Myofascial Release Myofascial release and manual stretching                OT Education - 06/17/16 1344    Education provided Yes   Education Details [employer] patient that she can use a 2# handweight at home for her exercises. If a 2# weight is not available she is can double her repetitions instead.    Person(s) Educated Patient   Methods Explanation;Demonstration   Comprehension Verbalized understanding;Returned demonstration          OT Short Term Goals - 06/04/16 1109      OT SHORT TERM GOAL #1   Title Patient will be educated and independent with HEP to increase functional use of RUE during daily tasks.    Time 4   Period Weeks   Status On-going     OT SHORT TERM GOAL #2   Title Patient will increase A/ROM in RUE to WNL to increase ability to complete reaching activities for work.   Time 4   Period Weeks   Status On-going     OT SHORT TERM GOAL #3   Title Patient will increase RUE strength to 3+/5 to increase ability to complete lightweight lifting activities.    Time 4   Period Weeks   Status On-going           OT Long Term Goals - 06/04/16 1109      OT LONG TERM GOAL #1   Title Patient will return to highest level of independence with all daily and work related tasks.   Time 8   Period Weeks   Status On-going     OT LONG TERM GOAL #2   Title Patient will decrease fascial restrictions on trace amount in RUE to increase functional mobility needed for high level  reaching.    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  Time 8   Period Weeks   Status On-going     OT LONG TERM GOAL #4   Title Patient's pain will remain low at a 2/10 or less when using RUE during daily tasks.    Time 8   Period Weeks   Status On-going               Plan - 06/17/16 1338    Clinical Impression Statement A: Increased to 2# handweight supine and standing during strengthening. Patient tolerated well with slight difficulty during standing. Overall, completed all exercises with great form and technique with only intial verbal cueing.    Plan P: Continue with scapular strengthening and sidelying strengthening next session.       Patient will benefit from skilled therapeutic intervention in order to improve the following deficits and impairments:  Decreased strength, Decreased range of motion, Pain, Increased fascial restricitons  Visit Diagnosis: Other symptoms and signs involving the musculoskeletal system  Acute pain of right shoulder  Stiffness of right shoulder, not elsewhere classified    Problem List Patient Active Problem List   Diagnosis Date Noted  . Coronary artery calcification seen on CT scan 03/22/2016  . 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 Bone metastasis ([facility]) 01/17/2016  . 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 Port catheter in place 04/14/2013  . Adenocarcinoma of left breast 01/27/2011  . Osteoporosis 01/27/2011   Ailene Ravel, OTR/L,CBIS  [phone]  06/17/2016, 2:36 PM  [facility] [address] [place], [place], 04/01/1951 Phone: [phone]   Fax:  [phone]  Name: [name] MRN: [id number] Date of Birth: 1951/06/13

## 2016-06-22 ENCOUNTER — Ambulatory Visit (HOSPITAL_COMMUNITY): Payer: BLUE CROSS/BLUE SHIELD | Admitting: Specialist

## 2016-06-22 DIAGNOSIS — M25511 Pain in right shoulder: Secondary | ICD-10-CM

## 2016-06-22 DIAGNOSIS — R29898 Other symptoms and signs involving the musculoskeletal system: Secondary | ICD-10-CM | POA: Diagnosis not present

## 2016-06-22 DIAGNOSIS — M25611 Stiffness of right shoulder, not elsewhere classified: Secondary | ICD-10-CM

## 2016-06-22 NOTE — Therapy (Signed)
[place], [place], [address] Phone: [phone]   Fax:  [phone]  Occupational Therapy Treatment  Patient Details  Name: [name] MRN: [id number] Date of Birth: 1951-04-08 Referring Provider: Sanjuana Kava, MD  Encounter Date: 06/22/2016      OT End of Session - 06/22/16 1552    Visit Number 7   Number of Visits 16   Date for OT Re-Evaluation 07/31/16   Authorization Type BCBS   Authorization Time Period 30 visits PT/OT/Chiropractor - 0 used.   Authorization - Visit Number 7   Authorization - Number of Visits 30   OT Start Time [id number]   OT Stop Time 1119   OT Time Calculation (min) 47 min   Activity Tolerance Patient tolerated treatment well   Behavior During Therapy WFL for tasks assessed/performed      Past Medical History:  Diagnosis Date  . 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 Bladder infection   . Bone metastasis ([facility]) 01/17/2016  . Borderline hypertension   . Breast cancer ([facility])   . Closed fracture of humerus sept 2013  . Fatigue   . Generalized headaches   . Osteoporosis 01/27/2011  . Pneumonia   . Port catheter in place 04/14/2013  . Post-mastectomy lymphedema syndrome   . 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 Reflux   . Retinal detachment   . 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 Swollen lymph nodes   . URI (upper respiratory infection) 02/17/12  . Wears glasses     Past Surgical History:  Procedure Laterality Date  . biopsy of lymph node     super clavicle  . BREAST SURGERY    . BTL    . CATARACT EXTRACTION    . cervical disc fusioni    . LYMPH NODE BIOPSY Right 06/19/2015   Procedure: RIGHT NECK LYMPH NODE EXCISION;  Surgeon: Rolm Bookbinder, MD;  Location: [facility];  Service: General;  Laterality: Right;  . MASTECTOMY MODIFIED RADICAL    . PORT-A-CATH REMOVAL     in  place (not removed)  . retinal detachment and repair      There were no vitals filed for this visit.      Subjective Assessment - 06/22/16 1034    Subjective  S:  It feels pretty good - I helped someone move, so I lifted alot and it was sore while I was doing it, but not after.    Currently in Pain? Yes   Pain Score 2    Pain Location Shoulder   Pain Orientation Right   Pain Descriptors / Indicators Aching   Pain Type Acute pain   Pain Onset More than a month ago   Pain Frequency Intermittent            [facility] OT Assessment - 06/22/16 0001      Assessment   Diagnosis Right clavicle fratcure     Precautions   Precautions Shoulder   Type of Shoulder Precautions Standard protocol: (4-8 weeks): full A/ROM/P/ROM by 6 weeks. A/ROM as symptoms and pain permits. No extreme ROM. (8-12 weeks): Full A/ROM in all planes. No heavy lifting. (>12 weeks): advanced strengthening as tolerated.    Precaution Comments Port a Cath under Right clavicle. No manual therapy or ES around port a cath. Per MD notes: active breast and colon cancer.  OT Treatments/Exercises (OP) - 06/22/16 0001      Exercises   Exercises Shoulder     Shoulder Exercises: Supine   Protraction PROM;5 reps;Strengthening;12 reps   Protraction Weight (lbs) 2   Horizontal ABduction PROM;5 reps;Strengthening;12 reps   Horizontal ABduction Weight (lbs) 2   External Rotation PROM;5 reps;Strengthening;12 reps   External Rotation Weight (lbs) 2   Internal Rotation PROM;5 reps;Strengthening;12 reps   Internal Rotation Weight (lbs) 2   Flexion PROM;5 reps;Strengthening;12 reps   Shoulder Flexion Weight (lbs) 2   ABduction PROM;5 reps;Strengthening;10 reps   Shoulder ABduction Weight (lbs) 2     Shoulder Exercises: Sidelying   External Rotation Strengthening;10 reps   External Rotation Weight (lbs) 2   Internal Rotation Strengthening;10 reps   Internal Rotation Weight (lbs) 2   Flexion  Strengthening;10 reps   Flexion Weight (lbs) 2   ABduction Strengthening;10 reps   ABduction Weight (lbs) 2   Other Sidelying Exercises horizontal abduction 10 times 2 #     Shoulder Exercises: ROM/Strengthening   UBE (Upper Arm Bike) 3' forward and 3' reverse at 1.0   Cybex Press 1 plate;10 reps   Cybex Row 1 plate;10 reps   "W" Arms 10 times with 2#   X to V Arms 10 times with 2#   Proximal Shoulder Strengthening, Supine 12 times with 2# resistance without resting   Ball on Wall 1' wtih arm flexed to 90 and 1' with arm abducted to 90     Manual Therapy   Manual Therapy Myofascial release   Manual therapy comments Manual therapy completed prior to exercises.    Myofascial Release Myofascial release and manual stretching  to right upper arm to decrease pain and restrictions and improve pain free mobility.                   OT Short Term Goals - 06/04/16 1109      OT SHORT TERM GOAL #1   Title Patient will be educated and independent with HEP to increase functional use of RUE during daily tasks.    Time 4   Period Weeks   Status On-going     OT SHORT TERM GOAL #2   Title Patient will increase A/ROM in RUE to WNL to increase ability to complete reaching activities for work.   Time 4   Period Weeks   Status On-going     OT SHORT TERM GOAL #3   Title Patient will increase RUE strength to 3+/5 to increase ability to complete lightweight lifting activities.    Time 4   Period Weeks   Status On-going           OT Long Term Goals - 06/04/16 1109      OT LONG TERM GOAL #1   Title Patient will return to highest level of independence with all daily and work related tasks.   Time 8   Period Weeks   Status On-going     OT LONG TERM GOAL #2   Title Patient will decrease fascial restrictions on trace amount in RUE to increase functional mobility needed for high level reaching.    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   Time 8   Period Weeks   Status On-going  OT LONG TERM GOAL #4   Title Patient's pain will remain low at a 2/10 or less when using RUE during daily tasks.    Time 8   Period Weeks   Status On-going               Plan - 06/22/16 1553    Clinical Impression Statement A:  Patient with improved control of strengthening exercises in supine and sidelying this date.  Improved form with scapular stability exercises.  Added cybex press and row this date for addtional scapular and proximal shoulder strengthening and stability.    Plan P:  Increase weight for cybex press and row to 2 plates.  Increase to level 2 with UBE for improved strength in right arm needed for caring for horses.       Patient will benefit from skilled therapeutic intervention in order to improve the following deficits and impairments:  Decreased strength, Decreased range of motion, Pain, Increased fascial restricitons  Visit Diagnosis: Other symptoms and signs involving the musculoskeletal system  Acute pain of right shoulder  Stiffness of right shoulder, not elsewhere classified    Problem List Patient Active Problem List   Diagnosis Date Noted  . Coronary artery calcification seen on CT scan 03/22/2016  . 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 Bone metastasis ([facility]) 01/17/2016  . 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 Port catheter in place 04/14/2013  . Adenocarcinoma of left breast 01/27/2011  . Osteoporosis 01/27/2011    Vangie Bicker, [place], OTR/L [phone]  06/22/2016, 3:56 PM  [facility], [place], [address] Phone: [phone]   Fax:  [phone]  Name: [name] MRN: [id number] Date of Birth: [birth year]

## 2016-06-24 ENCOUNTER — Ambulatory Visit (HOSPITAL_COMMUNITY): Payer: BLUE CROSS/BLUE SHIELD

## 2016-06-24 ENCOUNTER — Encounter (HOSPITAL_COMMUNITY): Payer: Self-pay

## 2016-06-24 DIAGNOSIS — M25611 Stiffness of right shoulder, not elsewhere classified: Secondary | ICD-10-CM

## 2016-06-24 DIAGNOSIS — R29898 Other symptoms and signs involving the musculoskeletal system: Secondary | ICD-10-CM | POA: Diagnosis not present

## 2016-06-24 DIAGNOSIS — M25511 Pain in right shoulder: Secondary | ICD-10-CM

## 2016-06-24 NOTE — Therapy (Signed)
[place], [place], [address] Phone: [phone]   Fax:  [phone]  Occupational Therapy Treatment  Patient Details  Name: [name] MRN: [id number] Date of Birth: 09-Nov-1950 Referring Provider: Sanjuana Kava, MD  Encounter Date: 06/24/2016      OT End of Session - 06/24/16 1317    Visit Number 8   Number of Visits 16   Date for OT Re-Evaluation 07/31/16   Authorization Type BCBS   Authorization Time Period 30 visits PT/OT/Chiropractor - 0 used.   Authorization - Visit Number 8   Authorization - Number of Visits 30   OT Start Time [id number]   OT Stop Time 1115   OT Time Calculation (min) 43 min   Activity Tolerance Patient tolerated treatment well   Behavior During Therapy WFL for tasks assessed/performed      Past Medical History:  Diagnosis Date  . 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 Bladder infection   . Bone metastasis ([facility]) 01/17/2016  . Borderline hypertension   . Breast cancer ([facility])   . Closed fracture of humerus sept 2013  . Fatigue   . Generalized headaches   . Osteoporosis 01/27/2011  . Pneumonia   . Port catheter in place 04/14/2013  . Post-mastectomy lymphedema syndrome   . 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 Reflux   . Retinal detachment   . 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 Swollen lymph nodes   . URI (upper respiratory infection) 02/17/12  . Wears glasses     Past Surgical History:  Procedure Laterality Date  . biopsy of lymph node     super clavicle  . BREAST SURGERY    . BTL    . CATARACT EXTRACTION    . cervical disc fusioni    . LYMPH NODE BIOPSY Right 06/19/2015   Procedure: RIGHT NECK LYMPH NODE EXCISION;  Surgeon: Rolm Bookbinder, MD;  Location: [facility];  Service: General;  Laterality: Right;  . MASTECTOMY MODIFIED RADICAL    . PORT-A-CATH REMOVAL     in  place (not removed)  . retinal detachment and repair      There were no vitals filed for this visit.      Subjective Assessment - 06/24/16 1033    Subjective  S: It's a little sore just over the actually break area. I think it's more bone soreness.   Currently in Pain? Yes   Pain Score 2    Pain Location Shoulder   Pain Orientation Right   Pain Descriptors / Indicators Aching                      OT Treatments/Exercises (OP) - 06/24/16 1045      Exercises   Exercises Shoulder     Shoulder Exercises: Supine   Protraction PROM;5 reps;Strengthening;15 reps   Horizontal ABduction PROM;5 reps;Strengthening;15 reps   Horizontal ABduction Weight (lbs) 2   External Rotation PROM;5 reps;Strengthening;15 reps   External Rotation Limitations 2   Internal Rotation PROM;5 reps;Strengthening;15 reps   Internal Rotation Weight (lbs) 2   Flexion PROM;5 reps;Strengthening;15 reps   Shoulder Flexion Weight (lbs) 2   ABduction PROM;5 reps;Strengthening;15 reps   Shoulder ABduction Weight (lbs) 2     Shoulder Exercises: Sidelying   External Rotation Strengthening;10 reps   External Rotation Weight (lbs) 2   Internal Rotation  Strengthening;10 reps   Internal Rotation Weight (lbs) 2   Flexion Strengthening;10 reps   Flexion Weight (lbs) 2   ABduction Strengthening;10 reps   ABduction Weight (lbs) 2   Other Sidelying Exercises horizontal abduction 10 times 2 #     Shoulder Exercises: Standing   Protraction Strengthening;12 reps   Protraction Weight (lbs) 2   Horizontal ABduction Strengthening;12 reps   Horizontal ABduction Weight (lbs) 2   External Rotation Strengthening;12 reps   External Rotation Weight (lbs) 2   Internal Rotation Strengthening;12 reps   Internal Rotation Weight (lbs) 2   Flexion Strengthening;12 reps   Shoulder Flexion Weight (lbs) 2   ABduction Strengthening;12 reps   Shoulder ABduction Weight (lbs) 2     Shoulder Exercises: ROM/Strengthening    UBE (Upper Arm Bike) Level 2 3' forward 3' reverse   Cybex Press 2 plate  579FGE   Cybex Row 2 plate  579FGE   Proximal Shoulder Strengthening, Seated 15X with 2# no rest breaks     Manual Therapy   Manual Therapy Myofascial release   Manual therapy comments Manual therapy completed prior to exercises.    Myofascial Release Myofascial release and manual stretching  to right upper arm to decrease pain and restrictions and improve pain free mobility.                   OT Short Term Goals - 06/04/16 1109      OT SHORT TERM GOAL #1   Title Patient will be educated and independent with HEP to increase functional use of RUE during Jennifer tasks.    Time 4   Period Weeks   Status On-going     OT SHORT TERM GOAL #2   Title Patient will increase A/ROM in RUE to WNL to increase ability to complete reaching activities for work.   Time 4   Period Weeks   Status On-going     OT SHORT TERM GOAL #3   Title Patient will increase RUE strength to 3+/5 to increase ability to complete lightweight lifting activities.    Time 4   Period Weeks   Status On-going           OT Long Term Goals - 06/04/16 1109      OT LONG TERM GOAL #1   Title Patient will return to highest level of independence with all Jennifer and work related tasks.   Time 8   Period Weeks   Status On-going     OT LONG TERM GOAL #2   Title Patient will decrease fascial restrictions on trace amount in RUE to increase functional mobility needed for high level reaching.    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   Time 8   Period Weeks   Status On-going     OT LONG TERM GOAL #4   Title Patient's pain will remain low at a 2/10 or less when using RUE during Jennifer tasks.    Time 8   Period Weeks   Status On-going               Plan - 06/24/16 1317    Clinical  Impression Statement A: Increased repetitions supine with 2#, increased cybex row and press to using 2 plate, and increased to level 2 on  UBE bike to increase shoulder and scapular strength in right arm. Pt did require form and positioning cueing when completing standing exercises to maintain core stability and eliminate an extended lower lumber region.    Plan P: Continue to work on proper form and technique during standing exercises. Measure for MD appointment.       Patient will benefit from skilled therapeutic intervention in order to improve the following deficits and impairments:  Decreased strength, Decreased range of motion, Pain, Increased fascial restricitons  Visit Diagnosis: Other symptoms and signs involving the musculoskeletal system  Acute pain of right shoulder  Stiffness of right shoulder, not elsewhere classified    Problem List Patient Active Problem List   Diagnosis Date Noted  . Coronary artery calcification seen on CT scan 03/22/2016  . 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 Bone metastasis ([facility]) 01/17/2016  . 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 Port catheter in place 04/14/2013  . Adenocarcinoma of left breast 01/27/2011  . Osteoporosis 01/27/2011   Ailene Ravel, OTR/L,CBIS  [phone]   06/24/2016, 1:22 PM  [facility] [address] [place], [place], [address] Phone: [phone]   Fax:  [phone]  Name: [name] MRN: [id number] Date of Birth: 01-Jun-1951

## 2016-06-30 ENCOUNTER — Encounter (HOSPITAL_COMMUNITY): Payer: Self-pay

## 2016-06-30 ENCOUNTER — Ambulatory Visit (INDEPENDENT_AMBULATORY_CARE_PROVIDER_SITE_OTHER): Payer: Self-pay | Admitting: Orthopaedic Surgery

## 2016-06-30 ENCOUNTER — Ambulatory Visit (HOSPITAL_COMMUNITY): Payer: BLUE CROSS/BLUE SHIELD

## 2016-06-30 ENCOUNTER — Ambulatory Visit (INDEPENDENT_AMBULATORY_CARE_PROVIDER_SITE_OTHER): Payer: BLUE CROSS/BLUE SHIELD

## 2016-06-30 VITALS — BP 130/67 | HR 61 | Ht 64.0 in | Wt 137.0 lb

## 2016-06-30 DIAGNOSIS — M79641 Pain in right hand: Secondary | ICD-10-CM

## 2016-06-30 DIAGNOSIS — M25611 Stiffness of right shoulder, not elsewhere classified: Secondary | ICD-10-CM

## 2016-06-30 DIAGNOSIS — R29898 Other symptoms and signs involving the musculoskeletal system: Secondary | ICD-10-CM | POA: Diagnosis not present

## 2016-06-30 DIAGNOSIS — S42021D Displaced fracture of shaft of right clavicle, subsequent encounter for fracture with routine healing: Secondary | ICD-10-CM | POA: Diagnosis not present

## 2016-06-30 DIAGNOSIS — M25511 Pain in right shoulder: Secondary | ICD-10-CM

## 2016-06-30 NOTE — Therapy (Signed)
[place], [place], [address] Phone: [phone]   Fax:  [phone]  Occupational Therapy Treatment And reassessment Patient Details  Name: [name] MRN: [id number] Date of Birth: 1951-04-28 Referring Provider: Sanjuana Kava, MD  Encounter Date: 06/30/2016      OT End of Session - 06/30/16 1643    Visit Number 9   Number of Visits 16   Authorization Type [employer]   Authorization Time Period 30 visits PT/OT/Chiropractor - 0 used.   Authorization - Visit Number 9   Authorization - Number of Visits 30   OT Start Time 1122  reassessment/DISCHARGE   OT Stop Time 1200   OT Time Calculation (min) 38 min   Activity Tolerance Patient tolerated treatment well   Behavior During Therapy WFL for tasks assessed/performed      Past Medical History:  Diagnosis Date  . 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 Bladder infection   . Bone metastasis ([facility]) 01/17/2016  . Borderline hypertension   . Breast cancer ([facility])   . Closed fracture of humerus sept 2013  . Fatigue   . Generalized headaches   . Osteoporosis 01/27/2011  . Pneumonia   . Port catheter in place 04/14/2013  . Post-mastectomy lymphedema syndrome   . 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 Reflux   . Retinal detachment   . 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 Swollen lymph nodes   . URI (upper respiratory infection) 02/17/12  . Wears glasses     Past Surgical History:  Procedure Laterality Date  . biopsy of lymph node     super clavicle  . BREAST SURGERY    . BTL    . CATARACT EXTRACTION    . cervical disc fusioni    . LYMPH NODE BIOPSY Right 06/19/2015   Procedure: RIGHT NECK LYMPH NODE EXCISION;  Surgeon: Rolm Bookbinder, MD;  Location: [facility];  Service: General;  Laterality: Right;  . MASTECTOMY MODIFIED RADICAL    . PORT-A-CATH REMOVAL     in  place (not removed)  . retinal detachment and repair      There were no vitals filed for this visit.      Subjective Assessment - 06/30/16 1639    Subjective  S: I'm doing pretty good.    Special Tests FOTO score: 66/100   Currently in Pain? Yes   Pain Score 2    Pain Location Shoulder   Pain Orientation Right   Pain Descriptors / Indicators Aching   Pain Type Acute pain   Pain Radiating Towards N/A   Pain Onset More than a month ago   Pain Frequency Intermittent   Aggravating Factors  quick movements   Pain Relieving Factors rest   Effect of Pain on Daily Activities min-mod effect   Multiple Pain Sites No            [facility] OT Assessment - 06/30/16 1123      Assessment   Diagnosis Right clavicle fratcure     Precautions   Precautions Shoulder   Type of Shoulder Precautions Standard protocol: (4-8 weeks): full A/ROM/P/ROM by 6 weeks. A/ROM as symptoms and pain permits. No extreme ROM. (8-12 weeks): Full A/ROM in all planes. No heavy lifting. (>12 weeks): advanced strengthening as tolerated.    Precaution Comments Port a Cath under Right clavicle. No manual therapy or ES  around port a cath. Per MD notes: active breast and colon cancer.      AROM   Overall AROM Comments Assessed standing. IR/er adducted.   AROM Assessment Site Shoulder   Right/Left Shoulder Right   Right Shoulder Flexion 150 Degrees  previous: 116   Right Shoulder ABduction 150 Degrees  previous: 110   Right Shoulder Internal Rotation 90 Degrees  previous: same   Right Shoulder External Rotation 80 Degrees  previous: 73     PROM   Overall PROM Comments Assessed supine. IR/er adducted.   PROM Assessment Site Shoulder   Right/Left Shoulder Right   Right Shoulder Flexion 180 Degrees  previous: 128   Right Shoulder ABduction 180 Degrees  previous: 116   Right Shoulder Internal Rotation 90 Degrees  previous: same   Right Shoulder External Rotation 70 Degrees  previous; same     Strength    Overall Strength Comments Assessed seated. IR/er adducted.   Strength Assessment Site Shoulder   Right/Left Shoulder Right   Right Shoulder Flexion 5/5  previous: 3/5   Right Shoulder ABduction 4+/5  previous: 3/5   Right Shoulder Internal Rotation 5/5  previous: 3/5   Right Shoulder External Rotation 4/5  previous: 3/5                  OT Treatments/Exercises (OP) - 06/30/16 1641      Exercises   Exercises Shoulder     Shoulder Exercises: Supine   Protraction PROM;5 reps   Horizontal ABduction PROM;5 reps   External Rotation PROM;5 reps   Internal Rotation PROM;5 reps   Flexion PROM;5 reps   ABduction PROM;5 reps     Shoulder Exercises: Standing   Protraction Strengthening;10 reps   Protraction Weight (lbs) 3   Horizontal ABduction Strengthening;10 reps   Horizontal ABduction Weight (lbs) 3   External Rotation Strengthening;10 reps   External Rotation Weight (lbs) 3   Internal Rotation Strengthening;10 reps   Internal Rotation Weight (lbs) 3   Flexion Strengthening;10 reps   Shoulder Flexion Weight (lbs) 3   ABduction Strengthening;10 reps   Shoulder ABduction Weight (lbs) 3   ABduction Limitations Able to achieve 50% range with proper form     Manual Therapy   Manual Therapy Myofascial release   Manual therapy comments Manual therapy completed prior to exercises.    Myofascial Release Myofascial release and manual stretching  to right upper arm to decrease pain and restrictions and improve pain free mobility.                 OT Education - 06/30/16 1642    Education provided Yes   Education Details Reviewed HEP and made adjustments as needed. Pt was educated on appropriate weight and frequency for completion.    Person(s) Educated Patient   Methods Explanation;Demonstration;Handout   Comprehension Returned demonstration;Verbalized understanding          OT Short Term Goals - 06/30/16 1147      OT SHORT TERM GOAL #1   Title Patient  will be educated and independent with HEP to increase functional use of RUE during daily tasks.    Time 4   Period Weeks   Status Achieved     OT SHORT TERM GOAL #2   Title Patient will increase A/ROM in RUE to WNL to increase ability to complete reaching activities for work.   Time 4   Period Weeks   Status Achieved     OT SHORT TERM GOAL #3  Title Patient will increase RUE strength to 3+/5 to increase ability to complete lightweight lifting activities.    Time 4   Period Weeks   Status Achieved           OT Long Term Goals - 06/30/16 1147      OT LONG TERM GOAL #1   Title Patient will return to highest level of independence with all daily and work related tasks.   Time 8   Period Weeks   Status Achieved     OT LONG TERM GOAL #2   Title Patient will decrease fascial restrictions on trace amount in RUE to increase functional mobility needed for high level reaching.    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   Time 8   Period Weeks   Status Achieved     OT LONG TERM GOAL #4   Title Patient's pain will remain low at a 2/10 or less when using RUE during daily tasks.    Time 8   Period Weeks   Status Achieved               Plan - 06/30/16 1644    Clinical Impression Statement A: Reassessment completed this date for MD appointment. Pt's measurements are not funcntional and she has met all her therapy goals for strength and ROM. Pt feeld confident that she can complete her HEP independently at home to continue to work on strengthening her RUE.    Plan P: D/C with HEP      Patient will benefit from skilled therapeutic intervention in order to improve the following deficits and impairments:  Decreased strength, Decreased range of motion, Pain, Increased fascial restricitons  Visit Diagnosis: Other symptoms and signs  involving the musculoskeletal system  Acute pain of right shoulder  Stiffness of right shoulder, not elsewhere classified    Problem List Patient Active Problem List   Diagnosis Date Noted  . Coronary artery calcification seen on CT scan 03/22/2016  . 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 Bone metastasis ([facility]) 01/17/2016  . 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 Port catheter in place 04/14/2013  . Adenocarcinoma of left breast 01/27/2011  . Osteoporosis 01/27/2011    OCCUPATIONAL THERAPY DISCHARGE SUMMARY  Visits from Start of Care: 9  Current functional level related to goals / functional outcomes: See above   Remaining deficits: See above   Education / Equipment: See above Plan: Patient agrees to discharge.  Patient goals were met. Patient is being discharged due to meeting the stated rehab goals.  ?????         Ailene Ravel, OTR/L,CBIS  [phone]  06/30/2016, 4:47 PM  [place], [place], [address] Phone: [phone]   Fax:  [phone]  Name: FILOMENA POKORNEY MRN: [id number] Date of Birth: 1950-12-27

## 2016-06-30 NOTE — Progress Notes (Signed)
CC:  My shoulder is not hurting  She uses a Kevlar type vest when riding her horse and it rubs against her healing clavicle fracture.  Otherwise, she has no pain.  She has completed her OT and is doing well.  X-rays were done, reported separately.  Encounter Diagnoses  Name Primary?  . Closed displaced fracture of shaft of right clavicle with routine healing, subsequent encounter Yes  . Right hand pain    Return in one month  Continue exercises.  Call if any problem.  Electronically Signed Sanjuana Kava, MD 11/21/20172:18 PM

## 2016-07-06 ENCOUNTER — Ambulatory Visit (HOSPITAL_COMMUNITY): Payer: BLUE CROSS/BLUE SHIELD | Admitting: Specialist

## 2016-07-08 ENCOUNTER — Encounter (HOSPITAL_COMMUNITY): Payer: BLUE CROSS/BLUE SHIELD

## 2016-07-09 ENCOUNTER — Other Ambulatory Visit (HOSPITAL_COMMUNITY): Payer: Self-pay | Admitting: Oncology

## 2016-07-09 DIAGNOSIS — C50912 Malignant neoplasm of unspecified site of left female breast: Secondary | ICD-10-CM

## 2016-07-09 MED ORDER — CAPECITABINE 500 MG PO TABS: ORAL_TABLET | ORAL | 0 refills | Status: DC

## 2016-07-09 MED ORDER — CAPECITABINE 150 MG PO TABS: ORAL_TABLET | ORAL | 0 refills | Status: DC

## 2016-07-13 ENCOUNTER — Encounter (HOSPITAL_COMMUNITY): Payer: BLUE CROSS/BLUE SHIELD | Admitting: Specialist

## 2016-07-14 ENCOUNTER — Encounter (HOSPITAL_COMMUNITY): Payer: BLUE CROSS/BLUE SHIELD

## 2016-07-14 ENCOUNTER — Encounter (HOSPITAL_BASED_OUTPATIENT_CLINIC_OR_DEPARTMENT_OTHER): Payer: BLUE CROSS/BLUE SHIELD | Admitting: Hematology & Oncology

## 2016-07-14 ENCOUNTER — Other Ambulatory Visit (HOSPITAL_COMMUNITY): Payer: BLUE CROSS/BLUE SHIELD

## 2016-07-14 ENCOUNTER — Encounter (HOSPITAL_COMMUNITY): Payer: Self-pay | Admitting: Hematology & Oncology

## 2016-07-14 ENCOUNTER — Encounter (HOSPITAL_COMMUNITY): Payer: Self-pay | Admitting: Lab

## 2016-07-14 ENCOUNTER — Encounter (HOSPITAL_COMMUNITY): Payer: BLUE CROSS/BLUE SHIELD | Attending: Hematology & Oncology

## 2016-07-14 ENCOUNTER — Ambulatory Visit (HOSPITAL_COMMUNITY): Payer: BLUE CROSS/BLUE SHIELD | Admitting: Oncology

## 2016-07-14 VITALS — BP 143/77 | HR 70 | Temp 98.7°F | Resp 18 | Ht 64.0 in | Wt 138.6 lb

## 2016-07-14 DIAGNOSIS — C210 Malignant neoplasm of anus, unspecified: Secondary | ICD-10-CM

## 2016-07-14 DIAGNOSIS — C50912 Malignant neoplasm of unspecified site of left female breast: Secondary | ICD-10-CM

## 2016-07-14 DIAGNOSIS — Z1502 Genetic susceptibility to malignant neoplasm of ovary: Secondary | ICD-10-CM

## 2016-07-14 DIAGNOSIS — Z1509 Genetic susceptibility to other malignant neoplasm: Secondary | ICD-10-CM

## 2016-07-14 DIAGNOSIS — C50919 Malignant neoplasm of unspecified site of unspecified female breast: Secondary | ICD-10-CM | POA: Insufficient documentation

## 2016-07-14 DIAGNOSIS — R599 Enlarged lymph nodes, unspecified: Secondary | ICD-10-CM | POA: Diagnosis not present

## 2016-07-14 DIAGNOSIS — Z1501 Genetic susceptibility to malignant neoplasm of breast: Secondary | ICD-10-CM

## 2016-07-14 DIAGNOSIS — C7951 Secondary malignant neoplasm of bone: Secondary | ICD-10-CM

## 2016-07-14 LAB — COMPREHENSIVE METABOLIC PANEL
ALT: 18 U/L (ref 14–54)
AST: 25 U/L (ref 15–41)
Albumin: 3.9 g/dL (ref 3.5–5.0)
Alkaline Phosphatase: 70 U/L (ref 38–126)
Anion gap: 6 (ref 5–15)
BUN: 18 mg/dL (ref 6–20)
CO2: 27 mmol/L (ref 22–32)
Calcium: 9.4 mg/dL (ref 8.9–10.3)
Chloride: 103 mmol/L (ref 101–111)
Creatinine, Ser: 0.84 mg/dL (ref 0.44–1.00)
GFR calc Af Amer: >60 mL/min (ref >60)
GFR calc non Af Amer: >60 mL/min (ref >60)
Glucose, Bld: 105 mg/dL — ABNORMAL HIGH (ref 65–99)
Potassium: 3.9 mmol/L (ref 3.5–5.1)
Sodium: 136 mmol/L (ref 135–145)
Total Bilirubin: 0.6 mg/dL (ref 0.3–1.2)
Total Protein: 6.9 g/dL (ref 6.5–8.1)

## 2016-07-14 LAB — CBC WITH DIFFERENTIAL/PLATELET
Basophils Absolute: 0 10*3/uL (ref 0.0–0.1)
Basophils Relative: 0 %
Eosinophils Absolute: 0 10*3/uL (ref 0.0–0.7)
Eosinophils Relative: 0 %
HCT: 38 % (ref 36.0–46.0)
Hemoglobin: 12.7 g/dL (ref 12.0–15.0)
Lymphocytes Relative: 21 %
Lymphs Abs: 1.4 10*3/uL (ref 0.7–4.0)
MCH: 37.2 pg — ABNORMAL HIGH (ref 26.0–34.0)
MCHC: 33.4 g/dL (ref 30.0–36.0)
MCV: 111.4 fL — ABNORMAL HIGH (ref 78.0–100.0)
Monocytes Absolute: 0.3 10*3/uL (ref 0.1–1.0)
Monocytes Relative: 5 %
Neutro Abs: 5.1 10*3/uL (ref 1.7–7.7)
Neutrophils Relative %: 74 %
Platelets: 210 10*3/uL (ref 150–400)
RBC: 3.41 MIL/uL — ABNORMAL LOW (ref 3.87–5.11)
RDW: 16 % — ABNORMAL HIGH (ref 11.5–15.5)
WBC: 6.9 10*3/uL (ref 4.0–10.5)

## 2016-07-14 MED ORDER — DENOSUMAB 120 MG/1.7ML ~~LOC~~ SOLN
120.0000 mg | Freq: Once | SUBCUTANEOUS | Status: AC
  Administered 2016-07-14: 120 mg via SUBCUTANEOUS
  Filled 2016-07-14: qty 1.7

## 2016-07-14 NOTE — Progress Notes (Signed)
[name] presents today for injection per MD orders. Xgeva 120 mg administered SQ in right Abdomen. Administration without incident. Patient tolerated well.

## 2016-07-14 NOTE — Progress Notes (Signed)
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 female with a medical history significant for Stage IV ER/PR+ breast cancer with initial diagnosis showing HER2 POSITIVITY initially diagnosed with Stage IIIC disease in 2012 and treated curative intent with metastatic disease discovered in October 2015 and osseous involvement noted on PET imaging on 09/10/2014, who is here for a follow up of Stage IV breast cancer and  squamous cell carcinoma of anus. She completed treatment of her anal carcinoma with XELODA/XRT and continues on XELODA therapy. She is tolerating it fairly well.   Patient is unaccompanied. She had a lab workup and is receiving Xgeva injection.  She feels somewhat queasy, but believes this is because she is very anxious today. Patient felt an enlarged posterior cervical lymph node a few days ago and is concerned. She denies poor appetite, cough, or any other new symptoms. [place] does report some minor right lower abdominal pain, but has diverticulitis.   She has seen Dr. Marcello Moores recently and notes that she was told "everything looked good." She will be scoped again in January.  Does not need any refills. She had fallen off her horse recently and sustained multiple rib fractures and a R clavicle fracture. She is making a great recovery.     Adenocarcinoma of left breast   04/09/2008 Pathology Results    Positive for breast cancer, ER 100%, PR 5%, HER-2/neu POSITIVE.      04/09/2008 Procedure    Biopsy of left axillary node mass         04/16/2008 - 08/13/2008 Chemotherapy    Dose dense epirubicin/Cytoxan 4 cycles followed by docetaxel 4 cycles. Herceptin initiated with docetaxel for a total of 52 weeks.      08/30/2008 Definitive Surgery    Left mastectomy revealing persistence of disease in the breast and lymph nodes.      09/17/2008 - 11/26/2008 Radiation Therapy    Radiation to left chest wall, supraclavicular, and axillary nodal areas.      11/27/2008 - 04/20/2011 Anti-estrogen oral therapy    Letrozole      03/30/2011 Procedure    Left supraclavicular lymph node biopsy      03/30/2011 Pathology Results    Recurrent breast cancer, invasive ductal type, ER 100%, PR 40%, HER-2 NEGATIVE      04/21/2011 - 06/04/2014 Anti-estrogen oral therapy    Tamoxifen      05/25/2014 PET scan    Progression in left submandibular lymph node, mediastinal lymph nodes, and probable lung involvement.      05/25/2014 Progression    PET scan demonstrates progression of disease.      06/13/2014 Procedure    Left supraclavicular mass biopsy      06/13/2014 Pathology Results    Metastatic carcinoma consistent with breast primary, involving skeletal muscle, perineural invasion, LVI, no lymphoid tissue identified, ER 98%, PR negative, HER-2 negative      06/25/2014 - 10/09/2014 Chemotherapy    Carboplatin/Taxol 6 cycles with excellent response  11/06/2014 - 06/10/2015 Chemotherapy    Palbociclib and Faslodex      01/08/2015 Adverse Reaction    Grade 3 neutropenia with cycle #2 of Palbociclib.      01/08/2015 Treatment Plan Change    Palbociclib dose reduced to 100 mg for cycle #3.      06/10/2015 Progression    Right cervical lymph node progression clinically developed over 4 days.  This lymph node was positive on PET scan in September 2016 and at that time was approximately 8 mm with possible progression in a single bone metastasis.      06/17/2015 Procedure    Biopsy of right cervical lymph node       06/17/2015 Pathology Results    Genetic sequencing performed revealing BRCA2 positivity      07/02/2015 - 08/01/2015 Chemotherapy    Exemestane/everolimus, 25 mg/ 5 mg x 7 days then increasing to 10 mg daily.      08/01/2015 PET scan    Diffuse pneumonitis.        08/01/2015 Adverse Reaction    Pneumonitis secondary to drug reaction from everolimus.      08/23/2015 - 12/23/2015 Chemotherapy    Cisplatin 40 mg/m every other week      11/15/2015 PET scan    Interval resolution of hypermetabolic airspace opacities in the lungs which were likely inflammatory. Abnormal high activity at the anal rectal junction. Very low grade residual activity at the site of L3 metastatic lesion.      12/23/2015 Treatment Plan Change    Holiday from treatment in preparation for trip to [place].      04/20/2016 -  Chemotherapy    XELODA 1300 mg po bid 7 days on/ 7 days off       05/15/2016 PET scan    1. Partial metabolic response at the hypermetabolic anorectal junction malignancy. 2. No hypermetabolic metastatic disease. Lumbar vertebral sclerotic osseous lesions are non hypermetabolic. 3. Hypermetabolism associated with acute/early subacute fractures of the right clavicle and right anterior second through fifth ribs, without associated bone lesions. 4. Aortic atherosclerosis.  Three-vessel coronary atherosclerosis.       Squamous cell carcinoma of anus ([place])   12/31/2015 Procedure    Colonoscopy by Dr. Britta Mccreedy      12/31/2015 Pathology Results    Invasive squamous cell carcinoma approximate 4 cm mass posterior wall of rectum      01/13/2016 Treatment Plan Change    Dr. Leighton Ruff consultation- no surgery.  Recommend chemoXRT      01/16/2016 Pathology Results    HPV POSITIVE      02/07/2016 - 03/10/2016 Chemotherapy    Xeloda 1450 mg PO BID 'Sunday PM- Friday AM during XRT      02/07/2016 - 03/10/2016 Radiation Therapy    Definitive XRT      02/19/2016 Treatment Plan Change    Xeloda  on hold due to increased GERD symtpoms.  PPI dose increased.  Will restart Xeloda tomorrow, 02/21/2016.      02/23/2016 Treatment Plan Change    Xeloda dose decreased to 1150 mg PO BID Sunday PM- Friday AM during XRT      10' /01/2016 PET scan    1. Partial metabolic response at the hypermetabolic anorectal junction malignancy.        Chart is reviewed. Oncology history is developed. Staging is completed. All dictations are reviewed.   Newly diagnosed squamous cell rectal cancer has been evaluated by a radiation therapy standpoint. She saw Dr. Lisbeth Renshaw on 01/10/2016.  I have his note available and this is reviewed:  One of Dr. Jaclyn Prime primary concern is that of attempting to spare the patient's bone marrow reserve is to maintain the ability to tolerate chemotherapy given that the dominant life threatening issue remains her metastatic breast cancer. However, the patient is symptomatic from her rectal cancer and this also needs to be addressed with local therapy. The patient is seeing Dr. Marcello Moores to discuss possible surgery. If this is felt to be warranted as initial treatment I believe this would be reasonable and I would not street feel strongly about preceding this with radiation treatment or chemoradiation treatment. However, if the patient will not undergo surgery after this discussion, and I would recommend palliative radiation treatment to the pelvis. This could be done in a focal manner to a greater extent than normal for rectal cancer and sparing of the bone marrow could be achieved to a much greater extent than a traditional rectal cancer plan. The patient's treatment could consist of traditional 5-1/2 week course of treatment or a shorter course of treatment such as a two-week course. One additional alternative would involve 5 Gy5 fraction as is often more commonly done in [place], which also may be a good short course option for the patient. With the diagnosis of squamous cell cancer, the  patient may have a very radiosensitive tumor if she does require palliative radiation therapy.  All of this was discussed with the patient. We will follow up on her discussion with Dr. Marcello Moores on Monday and then proceed accordingly. The patient could undergo simulation in a week on Thursday if this appears appropriate. All the patient's questions were answered today and she states that she is comfortable with this plan.   Review of Systems  Constitutional: Negative.  Negative for chills, fever and weight loss.  HENT: Negative.  Negative for hearing loss and tinnitus.        Enlarged posterior cervical lymph node  Eyes: Negative.  Negative for blurred vision and double vision.  Respiratory: Negative.  Negative for cough, hemoptysis, sputum production, shortness of breath and wheezing.   Cardiovascular: Negative.  Negative for chest pain.  Gastrointestinal: Negative for constipation, melena, nausea and vomiting.          Genitourinary: Negative.  Negative for dysuria, frequency, hematuria and urgency.  Musculoskeletal: Negative.  Negative for back pain, falls, joint pain and myalgias.  Skin: Negative.   Neurological: Positive for tingling. Negative for dizziness, sensory change, speech change, focal weakness, seizures, loss of consciousness, weakness and headaches.       Hand and foot neuropathy; worsening foot neuropathy  Endo/Heme/Allergies: Negative.   Psychiatric/Behavioral: Negative.   14 point review of systems was performed and is negative except as detailed under history of present illness and above  PAST MEDICAL HISTORY:   Past Medical History:  Diagnosis Date  . Adenocarcinoma of breast ([place]) 01/27/2011   stage IIIc breast cancer  . Bladder infection   . Bone metastasis ([place]) 01/17/2016  . Borderline hypertension   . Breast cancer ([place])   . Closed fracture of humerus sept 2013  . Fatigue   . Generalized headaches   . Osteoporosis 01/27/2011  . Pneumonia   . Port catheter in  place 04/14/2013  . Post-mastectomy lymphedema syndrome   . 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 Reflux   . Retinal detachment   . 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 Swollen lymph nodes   .  URI (upper respiratory infection) 02/17/12  . Wears glasses     ALLERGIES: Allergies  Allergen Reactions  . Ciprofloxacin Other (See Comments)    tendonitis      MEDICATIONS: I have reviewed the patient's current medications.    Current Outpatient Prescriptions on File Prior to Visit  Medication Sig Dispense Refill  . Acetylcarnitine HCl (ACETYL L-CARNITINE PO) Take 1,000 mg by mouth daily.    . calcium-vitamin D (OSCAL WITH D) 500-200 MG-UNIT per tablet Take 2 tablets by mouth daily.     . capecitabine (XELODA) 150 MG tablet Take 1300 mg twice a day 7 days on and 7 days off. 56 tablet 0  . capecitabine (XELODA) 500 MG tablet Take 1300 mg twice a day 7 days on and 7 days off 56 tablet 0  . diphenhydrAMINE (SOMINEX) 25 MG tablet Take 25 mg by mouth at bedtime as needed. To help with sleeping    . escitalopram (LEXAPRO) 10 MG tablet Take 15 mg by mouth daily.   5  . magnesium oxide (MAG-OX) 400 MG tablet Take 400 mg by mouth 2 (two) times daily.    . metoprolol succinate (TOPROL-XL) 25 MG 24 hr tablet TK 1 T QD  5  . nitrofurantoin (MACRODANTIN) 100 MG capsule Take 1 capsule (100 mg total) by mouth daily. 90 capsule 1  . omeprazole (PRILOSEC) 40 MG capsule Take 1 capsule (40 mg total) by mouth 2 (two) times daily. 180 capsule 4  . vitamin B-12 (CYANOCOBALAMIN) 1000 MCG tablet Take 1,000 mcg by mouth daily.     No current facility-administered medications on file prior to visit.      PAST SURGICAL HISTORY Past Surgical History:  Procedure Laterality Date  . biopsy of lymph node     super clavicle  . BREAST SURGERY    . BTL    . CATARACT EXTRACTION    . cervical disc fusioni    . LYMPH NODE BIOPSY Right 06/19/2015   Procedure: RIGHT  NECK LYMPH NODE EXCISION;  Surgeon: Rolm Bookbinder, MD;  Location: [facility];  Service: General;  Laterality: Right;  . MASTECTOMY MODIFIED RADICAL    . PORT-A-CATH REMOVAL     in place (not removed)  . retinal detachment and repair      FAMILY HISTORY: Family History  Problem Relation Age of Onset  . Hypertension Mother   . Heart disease Mother     atrial fib  . Heart disease Father   . Other Father     glaucoma    SOCIAL HISTORY:  reports that she has never smoked. She has never used smokeless tobacco. She reports that she drinks alcohol. She reports that she does not use drugs.  Social History   Social History  . Marital status: Divorced    Spouse name: N/A  . Number of children: N/A  . Years of education: N/A   Social History Main Topics  . Smoking status: Never Smoker  . Smokeless tobacco: Never Used  . Alcohol use Yes     Comment: not at present time due to chemo treatment  . Drug use: No  . Sexual activity: Not Asked   Other Topics Concern  . None   Social History Narrative  . None    PERFORMANCE STATUS: The patient's performance status is 1 - Symptomatic but completely ambulatory  PHYSICAL EXAM: Most Recent Vital Signs: Blood pressure (!) 143/77, pulse 70, temperature 98.7 F (37.1 C), temperature source Oral, resp. rate 18, height  '5\' 4"'  (1.626 m), weight 138 lb 9.6 oz (62.9 kg), SpO2 100 %. General appearance: alert, cooperative, appears stated age, no distress and smiling and unaccompanied.  Wears glasses. Head: Normocephalic, without obvious abnormality, atraumatic Eyes: negative findings: lids and lashes normal, conjunctivae and sclerae normal and corneas clear Throat: normal findings: oropharynx pink & moist without lesions or evidence of thrush Neck: no adenopathy, supple, symmetrical, trachea midline and thyroid not enlarged, symmetric, no tenderness/mass/nodules Lungs: clear to auscultation bilaterally Heart: regular rate and  rhythm, S1, S2 normal, no murmur, click, rub or gallop Abdomen: soft, non-tender; bowel sounds normal; no masses,  no organomegaly Extremities: extremities normal, atraumatic, no cyanosis or edema and left UE lympedema sleeve in place Skin: Skin color, texture, turgor normal. No rashes or lesions Lymph nodes: prominent posterior cervical lymph node, 0.5 cm Neurologic: Grossly normal  LABORATORY DATA:  I have reviewed the data as listed. Results for orders placed or performed in visit on 07/14/16 (from the past 48 hour(s))  CBC with Differential     Status: Abnormal   Collection Time: 07/14/16 10:15 AM  Result Value Ref Range   WBC 6.9 4.0 - 10.5 K/uL   RBC 3.41 (L) 3.87 - 5.11 MIL/uL   Hemoglobin 12.7 12.0 - 15.0 g/dL   HCT 38.0 36.0 - 46.0 %   MCV 111.4 (H) 78.0 - 100.0 fL   MCH 37.2 (H) 26.0 - 34.0 pg   MCHC 33.4 30.0 - 36.0 g/dL   RDW 16.0 (H) 11.5 - 15.5 %   Platelets 210 150 - 400 K/uL   Neutrophils Relative % 74 %   Neutro Abs 5.1 1.7 - 7.7 K/uL   Lymphocytes Relative 21 %   Lymphs Abs 1.4 0.7 - 4.0 K/uL   Monocytes Relative 5 %   Monocytes Absolute 0.3 0.1 - 1.0 K/uL   Eosinophils Relative 0 %   Eosinophils Absolute 0.0 0.0 - 0.7 K/uL   Basophils Relative 0 %   Basophils Absolute 0.0 0.0 - 0.1 K/uL  Comprehensive metabolic panel     Status: Abnormal   Collection Time: 07/14/16 10:15 AM  Result Value Ref Range   Sodium 136 135 - 145 mmol/L   Potassium 3.9 3.5 - 5.1 mmol/L   Chloride 103 101 - 111 mmol/L   CO2 27 22 - 32 mmol/L   Glucose, Bld 105 (H) 65 - 99 mg/dL   BUN 18 6 - 20 mg/dL   Creatinine, Ser 0.84 0.44 - 1.00 mg/dL   Calcium 9.4 8.9 - 10.3 mg/dL   Total Protein 6.9 6.5 - 8.1 g/dL   Albumin 3.9 3.5 - 5.0 g/dL   AST 25 15 - 41 U/L   ALT 18 14 - 54 U/L   Alkaline Phosphatase 70 38 - 126 U/L   Total Bilirubin 0.6 0.3 - 1.2 mg/dL   GFR calc non Af Amer >60 >60 mL/min   GFR calc Af Amer >60 >60 mL/min    Comment: (NOTE) The eGFR has been calculated using  the CKD EPI equation. This calculation has not been validated in all clinical situations. eGFR's persistently <60 mL/min signify possible Chronic Kidney Disease.    Anion gap 6 5 - 15      RADIOGRAPHY: I have personally reviewed the radiological images as listed and agreed with the findings in the report.  Study Result   CLINICAL DATA:  Subsequent treatment strategy for metastatic left breast cancer diagnosed June 2012 and squamous cell carcinoma of the anus diagnosed June 2017.  EXAM: NUCLEAR MEDICINE PET SKULL BASE TO THIGH  TECHNIQUE: 6.7 mCi F-18 FDG was injected intravenously. Full-ring PET imaging was performed from the skull base to thigh after the radiotracer. CT data was obtained and used for attenuation correction and anatomic localization.  FASTING BLOOD GLUCOSE:  Value: 107 mg/dl  COMPARISON:  11/15/2015 PET-CT and 01/22/2016 CT chest, abdomen and pelvis.  FINDINGS: NECK  No hypermetabolic lymph nodes in the neck.  CHEST  No hypermetabolic axillary, mediastinal or hilar nodes. Right subclavian Port-A-Cath terminates in the lower third of the superior vena cava. Left anterior descending, left circumflex and right coronary atherosclerosis. Atherosclerotic nonaneurysmal thoracic aorta. No pleural effusions. No pneumothorax. Right upper lobe 7 mm ground-glass pulmonary nodule is stable since 04/18/2008, not associated with significant metabolism and considered benign. Stable subpleural reticulation in the anterior left upper lobe from radiation fibrosis. No acute consolidative airspace disease or new significant pulmonary nodules. Left mastectomy. Left axillary nodal dissection clips.  ABDOMEN/PELVIS  There is focal hypermetabolism in the right posterior anorectal junction with max SUV 11.7, previously 18.3 on 11/15/2015, decreased, with no discrete mass in this location on the CT images.  No abnormal hypermetabolic activity within the liver,  pancreas, adrenal glands, or spleen. No hypermetabolic lymph nodes in the abdomen or pelvis. Atherosclerotic nonaneurysmal abdominal aorta.  SKELETON  No hypermetabolism within the right posterior L1 or L3 vertebral sclerotic lesions. No hypermetabolic osseous lesions suggestive of skeletal metastasis. There is hypermetabolism associated with a comminuted fracture of the mid to lateral shaft of the right clavicle. There is hypermetabolism associated with acute/early subacute anterior right second through fifth right rib fractures. No bone lesions are seen associated with these fractures. Surgical hardware from ACD[employer] is seen in the lower cervical spine. There is curvilinear intramuscular hypermetabolism in the right posterior paraspinal musculature of the neck and chest, with no CT correlate, favored to represent the sequela of activity or soft tissue injury.  IMPRESSION: 1. Partial metabolic response at the hypermetabolic anorectal junction malignancy. 2. No hypermetabolic metastatic disease. Lumbar vertebral sclerotic osseous lesions are non hypermetabolic. 3. Hypermetabolism associated with acute/early subacute fractures of the right clavicle and right anterior second through fifth ribs, without associated bone lesions. 4. Aortic atherosclerosis.  Three-vessel coronary atherosclerosis.   Electronically Signed   By: Ilona Sorrel M.D.   On: 05/15/2016 13:58     PATHOLOGY:       ASSESSMENT/PLAN:  Excellent PS Squamous cell carcinoma of anus ([facility])  She continues to follow with Dr. Marcello Moores. She has another anoscopy in January. She is due for repeat PET.    Adenocarcinoma of left breast BRCA2 Positive XELODA related skin toxicity  Stage IV breast cancer ER/PR positive, HER-2/neu negative. BRCA2 POSITIVE.  History of stage IIIc left breast cancer in 2009 treated with neoadjuvant chemotherapy consisting of epirubicin and Cytoxan followed by 4 cycles of docetaxel from  04/16/2008-08/13/2008 with the initiation of Herceptin for 52 weeks at the start of docetaxel. She then underwent a left mastectomy on 08/30/2008 showing persistent disease in the breast and lymph nodes. This was followed by radiation therapy from 09/17/2008-11/26/2008 to the left chest wall, supraclavicular, and axillary node areas. She then moved on to antiestrogen therapy with letrozole (11/27/2008-04/20/2011). She was found to have recurrent breast cancer on supraclavicular lymph node biopsy on 03/30/2011 leading to a change in antiestrogen therapy to tamoxifen (04/21/2011-06/04/2014). Pet imaging on 05/25/2014 demonstrated progression of left submandibular lymph node, mediastinal lymph node, and probable lung involvement. This was followed by a  biopsy on 06/13/2014 of a left supraclavicular mass that did reveal metastatic carcinoma consistent with breast primary. She was then treated with carboplatin and Taxol for 6 cycles with an excellent response (06/25/2014-10/09/2014). She was then transition back to hormone therapy consisting of Ibrance and Faslodex (11/13/2014-06/10/2015). This was complicated by grade 3 neutropenia with cycle #2 resulting in a dose reduction of Ibrance to 100 mg for cycle 3. Unfortunately, on 06/10/2015, she was noted to have progression of disease in the right mid cervical lymph node over a 4 day period that was PET avid. Therapy was therefore changed to exemestane and everolimus (51/46/0479-98/72/1587) that was complicated by an adverse reaction consisting of diffuse pneumonitis. This regimen was therefore discontinued. She then transitioned back to systemic chemotherapy consisting of cisplatin every 14 days (08/23/2015-12/23/2015). She was put on a drug holiday beginning on 12/23/2015 in preparation for a trip to [place] on 01/25/2016.  She continues on XELODA  for her STAGE IV breast cancer. She did continue with XELODA in a week on/week off fashion. She will continue with current  dosing.   Patient is pleasant. Labs are normal. She continues to take Xeloda and is tolerating it well.  [place] reports an enlarged posterior cervical lymph node and is very concerned. I have ordered a PET scan and referred her to Dr. Donne Hazel for evaluation (per her wishes, she is worried that this is recurrence of her breast cancer)  RTC in a month. She will be called after PET scan/her visit with Dr. Donne Hazel.  Bone metastasis ([facility]) Bone metastases from breast cancer. On Xgeva therapy. She is also taking calcium and vitamin D.  Patient received Xgeva injection today.   Orders Placed This Encounter  Procedures  . NM PET Image Restag (PS) Skull Base To Thigh    Standing Status:   Future    Standing Expiration Date:   07/14/2017    Order Specific Question:   Reason for Exam (SYMPTOM  OR DIAGNOSIS REQUIRED)    Answer:   restaging stage IV breast cancer, anal carcinoma    Order Specific Question:   Preferred imaging location?    Answer:   [facility]    Order Specific Question:   If indicated for the ordered procedure, I authorize the administration of a radiopharmaceutical per Radiology protocol    Answer:   Yes    All questions were answered. The patient knows to call the clinic with any problems, questions or concerns. We can certainly see the patient much sooner if necessary.  This document serves as a record of services personally performed by Ancil Linsey, MD. It was created on her behalf by Toni Amend, a trained medical scribe. The creation of this record is based on the scribe's personal observations and the provider's statements to them. This document has been checked and approved by the attending provider.  I have reviewed the above documentation for accuracy and completeness, and I agree with the above.  This note is electronically signed by: Molli Hazard, MD   07/14/2016 12:47 PM

## 2016-07-14 NOTE — Patient Instructions (Addendum)
[place] at [facility] Discharge Instructions  RECOMMENDATIONS MADE BY THE CONSULTANT AND ANY TEST RESULTS WILL BE SENT TO YOUR REFERRING PHYSICIAN.  You saw Dr.Penland today. PET/CT scheduled. Referring to Dr. Donne Hazel for post cervical lymph node. Return in 1 month for follow up. See Amy at checkout for appointments.  Thank you for choosing [place] at [facility] to provide your oncology and hematology care.  To afford each patient quality time with our provider, please arrive at least 15 minutes before your scheduled appointment time.   Beginning January 23rd 2017 lab work for the [facility] will be done in the  Main lab at [facility] on 1st floor. If you have a lab appointment with the [place] please come in thru the  Main Entrance and check in at the main information desk  You need to re-schedule your appointment should you arrive 10 or more minutes late.  We strive to give you quality time with our providers, and arriving late affects you and other patients whose appointments are after yours.  Also, if you no show three or more times for appointments you may be dismissed from the clinic at the providers discretion.     Again, thank you for choosing [facility].  Our hope is that these requests will decrease the amount of time that you wait before being seen by our physicians.       _____________________________________________________________  Should you have questions after your visit to [facility], please contact our office at [phone] between the hours of 8:30 a.m. and 4:30 p.m.  Voicemails left after 4:30 p.m. will not be returned until the following business day.  For prescription refill requests, have your pharmacy contact our office.         Resources For Cancer Patients and their Caregivers ? American Cancer Society: Can assist with transportation, wigs, general needs, runs Look  Good Feel Better.        719-248-9809 ? Cancer Care: Provides financial assistance, online support groups, medication/co-pay assistance.  1-800-813-HOPE 929-444-9760) ? [place] Assists [place] cancer patients and their families through emotional , educational and financial support.  (605)844-3732 ? [place] DSS Where to apply for food stamps, Medicaid and utility assistance. 5796604678 ? RCATS: Transportation to medical appointments. 8313453575 ? Social Security Administration: May apply for disability if have a Stage IV cancer. 470-635-6391 9010468950 ? [employer], Disability and Transit Services: Assists with nutrition, care and transit needs. [place] Support Programs: @10RELATIVEDAYS @ > Cancer Support Group  2nd Tuesday of the month 1pm-2pm, Journey Room  > Creative Journey  3rd Tuesday of the month 1130am-1pm, Journey Room  > Look Good Feel Better  1st Wednesday of the month 10am-12 noon, Journey Room (Call [place] to register [phone])

## 2016-07-14 NOTE — Patient Instructions (Signed)
[place] at [facility] Discharge Instructions  RECOMMENDATIONS MADE BY THE CONSULTANT AND ANY TEST RESULTS WILL BE SENT TO YOUR REFERRING PHYSICIAN.  Xgeva 120 mg injection given today as ordered. Return as scheduled.  Thank you for choosing [place] at [facility] to provide your oncology and hematology care.  To afford each patient quality time with our provider, please arrive at least 15 minutes before your scheduled appointment time.   Beginning January 23rd 2017 lab work for the [facility] will be done in the  Main lab at [facility] on 1st floor. If you have a lab appointment with the [place] please come in thru the  Main Entrance and check in at the main information desk  You need to re-schedule your appointment should you arrive 10 or more minutes late.  We strive to give you quality time with our providers, and arriving late affects you and other patients whose appointments are after yours.  Also, if you no show three or more times for appointments you may be dismissed from the clinic at the providers discretion.     Again, thank you for choosing [facility].  Our hope is that these requests will decrease the amount of time that you wait before being seen by our physicians.       _____________________________________________________________  Should you have questions after your visit to [facility], please contact our office at [phone] between the hours of 8:30 a.m. and 4:30 p.m.  Voicemails left after 4:30 p.m. will not be returned until the following business day.  For prescription refill requests, have your pharmacy contact our office.         Resources For Cancer Patients and their Caregivers ? American Cancer Society: Can assist with transportation, wigs, general needs, runs Look Good Feel Better.        (785)168-6450 ? Cancer Care: Provides financial assistance, online support  groups, medication/co-pay assistance.  1-800-813-HOPE 208 716 2420) ? [place] Assists [place] cancer patients and their families through emotional , educational and financial support.  931-031-9074 ? [place] DSS Where to apply for food stamps, Medicaid and utility assistance. 234-388-9031 ? RCATS: Transportation to medical appointments. 249-056-6158 ? Social Security Administration: May apply for disability if have a Stage IV cancer. 8192693447 912-655-6676 ? [employer], Disability and Transit Services: Assists with nutrition, care and transit needs. [place] Support Programs: @10RELATIVEDAYS @ > Cancer Support Group  2nd Tuesday of the month 1pm-2pm, Journey Room  > Creative Journey  3rd Tuesday of the month 1130am-1pm, Journey Room  > Look Good Feel Better  1st Wednesday of the month 10am-12 noon, Journey Room (Call [place] to register [phone])

## 2016-07-14 NOTE — Progress Notes (Unsigned)
Referral sent to [facility] Dr Donne Hazel.  Records faxed on 12/5

## 2016-07-15 ENCOUNTER — Encounter (HOSPITAL_COMMUNITY): Payer: BLUE CROSS/BLUE SHIELD

## 2016-07-15 LAB — CANCER ANTIGEN 15-3: CA 15-3: 29.4 U/mL — ABNORMAL HIGH (ref 0.0–25.0)

## 2016-07-15 LAB — CANCER ANTIGEN 27.29: CA 27.29: 23.4 U/mL (ref 0.0–38.6)

## 2016-07-16 ENCOUNTER — Encounter (HOSPITAL_COMMUNITY): Payer: Self-pay | Admitting: Hematology & Oncology

## 2016-07-20 ENCOUNTER — Encounter (HOSPITAL_COMMUNITY): Payer: BLUE CROSS/BLUE SHIELD

## 2016-07-22 ENCOUNTER — Encounter (HOSPITAL_COMMUNITY): Payer: BLUE CROSS/BLUE SHIELD

## 2016-07-22 ENCOUNTER — Other Ambulatory Visit: Payer: Self-pay | Admitting: General Surgery

## 2016-07-23 ENCOUNTER — Encounter (HOSPITAL_BASED_OUTPATIENT_CLINIC_OR_DEPARTMENT_OTHER): Payer: Self-pay | Admitting: *Deleted

## 2016-07-24 ENCOUNTER — Encounter (HOSPITAL_BASED_OUTPATIENT_CLINIC_OR_DEPARTMENT_OTHER)
Admission: RE | Admit: 2016-07-24 | Discharge: 2016-07-24 | Disposition: A | Discharge status: Home / Self Care | Payer: BLUE CROSS/BLUE SHIELD | Source: Ambulatory Visit | Attending: General Surgery | Admitting: General Surgery

## 2016-07-24 ENCOUNTER — Other Ambulatory Visit: Payer: Self-pay

## 2016-07-24 ENCOUNTER — Encounter (HOSPITAL_COMMUNITY)
Admission: RE | Admit: 2016-07-24 | Discharge: 2016-07-24 | Disposition: A | Discharge status: Home / Self Care | Payer: BLUE CROSS/BLUE SHIELD | Source: Ambulatory Visit | Attending: Hematology & Oncology | Admitting: Hematology & Oncology

## 2016-07-24 DIAGNOSIS — K219 Gastro-esophageal reflux disease without esophagitis: Secondary | ICD-10-CM | POA: Diagnosis not present

## 2016-07-24 DIAGNOSIS — C801 Malignant (primary) neoplasm, unspecified: Secondary | ICD-10-CM | POA: Diagnosis not present

## 2016-07-24 DIAGNOSIS — I10 Essential (primary) hypertension: Secondary | ICD-10-CM | POA: Diagnosis not present

## 2016-07-24 DIAGNOSIS — Z9221 Personal history of antineoplastic chemotherapy: Secondary | ICD-10-CM | POA: Diagnosis not present

## 2016-07-24 DIAGNOSIS — Z85828 Personal history of other malignant neoplasm of skin: Secondary | ICD-10-CM | POA: Diagnosis not present

## 2016-07-24 DIAGNOSIS — C50919 Malignant neoplasm of unspecified site of unspecified female breast: Secondary | ICD-10-CM | POA: Insufficient documentation

## 2016-07-24 DIAGNOSIS — C7951 Secondary malignant neoplasm of bone: Secondary | ICD-10-CM | POA: Diagnosis present

## 2016-07-24 DIAGNOSIS — Z853 Personal history of malignant neoplasm of breast: Secondary | ICD-10-CM | POA: Diagnosis not present

## 2016-07-24 DIAGNOSIS — Z9012 Acquired absence of left breast and nipple: Secondary | ICD-10-CM | POA: Diagnosis not present

## 2016-07-24 DIAGNOSIS — Z85830 Personal history of malignant neoplasm of bone: Secondary | ICD-10-CM | POA: Diagnosis not present

## 2016-07-24 DIAGNOSIS — C210 Malignant neoplasm of anus, unspecified: Secondary | ICD-10-CM | POA: Insufficient documentation

## 2016-07-24 DIAGNOSIS — R221 Localized swelling, mass and lump, neck: Secondary | ICD-10-CM | POA: Diagnosis present

## 2016-07-24 DIAGNOSIS — C770 Secondary and unspecified malignant neoplasm of lymph nodes of head, face and neck: Secondary | ICD-10-CM | POA: Diagnosis not present

## 2016-07-24 LAB — GLUCOSE, CAPILLARY: Glucose-Capillary: 100 mg/dL — ABNORMAL HIGH (ref 65–99)

## 2016-07-24 MED ORDER — FLUDEOXYGLUCOSE F - 18 (FDG) INJECTION
6.9200 | Freq: Once | INTRAVENOUS | Status: AC | PRN
  Administered 2016-07-24: 6.92 via INTRAVENOUS

## 2016-07-27 ENCOUNTER — Encounter (HOSPITAL_BASED_OUTPATIENT_CLINIC_OR_DEPARTMENT_OTHER)
Admission: RE | Disposition: A | Discharge status: Home / Self Care | Payer: Self-pay | Source: Ambulatory Visit | Attending: General Surgery

## 2016-07-27 ENCOUNTER — Ambulatory Visit (HOSPITAL_BASED_OUTPATIENT_CLINIC_OR_DEPARTMENT_OTHER): Payer: BLUE CROSS/BLUE SHIELD | Admitting: Anesthesiology

## 2016-07-27 ENCOUNTER — Ambulatory Visit (HOSPITAL_BASED_OUTPATIENT_CLINIC_OR_DEPARTMENT_OTHER)
Admission: RE | Admit: 2016-07-27 | Discharge: 2016-07-27 | Disposition: A | Discharge status: Home / Self Care | Payer: BLUE CROSS/BLUE SHIELD | Source: Ambulatory Visit | Attending: General Surgery | Admitting: General Surgery

## 2016-07-27 ENCOUNTER — Encounter (HOSPITAL_BASED_OUTPATIENT_CLINIC_OR_DEPARTMENT_OTHER): Payer: Self-pay

## 2016-07-27 ENCOUNTER — Encounter (HOSPITAL_COMMUNITY): Payer: BLUE CROSS/BLUE SHIELD

## 2016-07-27 DIAGNOSIS — Z9012 Acquired absence of left breast and nipple: Secondary | ICD-10-CM | POA: Insufficient documentation

## 2016-07-27 DIAGNOSIS — Z853 Personal history of malignant neoplasm of breast: Secondary | ICD-10-CM | POA: Insufficient documentation

## 2016-07-27 DIAGNOSIS — I10 Essential (primary) hypertension: Secondary | ICD-10-CM | POA: Insufficient documentation

## 2016-07-27 DIAGNOSIS — C770 Secondary and unspecified malignant neoplasm of lymph nodes of head, face and neck: Secondary | ICD-10-CM | POA: Insufficient documentation

## 2016-07-27 DIAGNOSIS — C801 Malignant (primary) neoplasm, unspecified: Secondary | ICD-10-CM | POA: Insufficient documentation

## 2016-07-27 DIAGNOSIS — K219 Gastro-esophageal reflux disease without esophagitis: Secondary | ICD-10-CM | POA: Insufficient documentation

## 2016-07-27 DIAGNOSIS — Z9221 Personal history of antineoplastic chemotherapy: Secondary | ICD-10-CM | POA: Insufficient documentation

## 2016-07-27 DIAGNOSIS — Z85830 Personal history of malignant neoplasm of bone: Secondary | ICD-10-CM | POA: Insufficient documentation

## 2016-07-27 DIAGNOSIS — Z85828 Personal history of other malignant neoplasm of skin: Secondary | ICD-10-CM | POA: Insufficient documentation

## 2016-07-27 HISTORY — DX: Major depressive disorder, single episode, unspecified: F32.9

## 2016-07-27 HISTORY — DX: Gastro-esophageal reflux disease without esophagitis: K21.9

## 2016-07-27 HISTORY — PX: MASS EXCISION: SHX2000

## 2016-07-27 HISTORY — DX: Essential (primary) hypertension: I10

## 2016-07-27 HISTORY — DX: Myoneural disorder, unspecified: G70.9

## 2016-07-27 HISTORY — DX: Depression, unspecified: F32.A

## 2016-07-27 SURGERY — EXCISION MASS
Anesthesia: Monitor Anesthesia Care | Site: Neck | Laterality: Left

## 2016-07-27 MED ORDER — LIDOCAINE 2% (20 MG/ML) 5 ML SYRINGE
INTRAMUSCULAR | Status: AC
  Filled 2016-07-27: qty 5

## 2016-07-27 MED ORDER — ONDANSETRON HCL 4 MG/2ML IJ SOLN
INTRAMUSCULAR | Status: DC | PRN
  Administered 2016-07-27: 4 mg via INTRAVENOUS

## 2016-07-27 MED ORDER — CEFAZOLIN SODIUM-DEXTROSE 2-4 GM/100ML-% IV SOLN
INTRAVENOUS | Status: AC
  Filled 2016-07-27: qty 100

## 2016-07-27 MED ORDER — FENTANYL CITRATE (PF) 100 MCG/2ML IJ SOLN
INTRAMUSCULAR | Status: AC
  Filled 2016-07-27: qty 2

## 2016-07-27 MED ORDER — LIDOCAINE HCL (PF) 1 % IJ SOLN
INTRAMUSCULAR | Status: AC
  Filled 2016-07-27: qty 30

## 2016-07-27 MED ORDER — SCOPOLAMINE 1 MG/3DAYS TD PT72: 1.0000 | MEDICATED_PATCH | Freq: Once | TRANSDERMAL | Status: DC | PRN

## 2016-07-27 MED ORDER — GABAPENTIN 300 MG PO CAPS
ORAL_CAPSULE | ORAL | Status: AC
  Filled 2016-07-27: qty 1

## 2016-07-27 MED ORDER — PROPOFOL 500 MG/50ML IV EMUL
INTRAVENOUS | Status: DC | PRN
  Administered 2016-07-27: 50 ug via INTRAVENOUS

## 2016-07-27 MED ORDER — FENTANYL CITRATE (PF) 100 MCG/2ML IJ SOLN
50.0000 ug | INTRAMUSCULAR | Status: DC | PRN
  Administered 2016-07-27: 50 ug via INTRAVENOUS

## 2016-07-27 MED ORDER — CEFAZOLIN SODIUM-DEXTROSE 2-4 GM/100ML-% IV SOLN: 2.0000 g | INTRAVENOUS | Status: DC

## 2016-07-27 MED ORDER — MIDAZOLAM HCL 2 MG/2ML IJ SOLN
INTRAMUSCULAR | Status: AC
  Filled 2016-07-27: qty 2

## 2016-07-27 MED ORDER — PROPOFOL 500 MG/50ML IV EMUL
INTRAVENOUS | Status: DC | PRN
  Administered 2016-07-27: 50 ug/kg/min via INTRAVENOUS

## 2016-07-27 MED ORDER — BUPIVACAINE HCL (PF) 0.25 % IJ SOLN
INTRAMUSCULAR | Status: AC
  Filled 2016-07-27: qty 90

## 2016-07-27 MED ORDER — LIDOCAINE HCL 1 % IJ SOLN
INTRAMUSCULAR | Status: DC | PRN
  Administered 2016-07-27: 5.5 mL via SUBCUTANEOUS

## 2016-07-27 MED ORDER — ONDANSETRON HCL 4 MG/2ML IJ SOLN
INTRAMUSCULAR | Status: AC
  Filled 2016-07-27: qty 2

## 2016-07-27 MED ORDER — LACTATED RINGERS IV SOLN
INTRAVENOUS | Status: DC
  Administered 2016-07-27: 14:00:00 via INTRAVENOUS

## 2016-07-27 MED ORDER — ACETAMINOPHEN 500 MG PO TABS
1000.0000 mg | ORAL_TABLET | ORAL | Status: AC
  Administered 2016-07-27: 1000 mg via ORAL

## 2016-07-27 MED ORDER — MIDAZOLAM HCL 2 MG/2ML IJ SOLN
1.0000 mg | INTRAMUSCULAR | Status: DC | PRN
  Administered 2016-07-27: 2 mg via INTRAVENOUS

## 2016-07-27 MED ORDER — LIDOCAINE HCL (CARDIAC) 20 MG/ML IV SOLN
INTRAVENOUS | Status: DC | PRN
  Administered 2016-07-27: 60 mg via INTRAVENOUS

## 2016-07-27 MED ORDER — PROMETHAZINE HCL 25 MG/ML IJ SOLN: 6.2500 mg | INTRAMUSCULAR | Status: DC | PRN

## 2016-07-27 MED ORDER — GABAPENTIN 300 MG PO CAPS
300.0000 mg | ORAL_CAPSULE | ORAL | Status: AC
  Administered 2016-07-27: 300 mg via ORAL

## 2016-07-27 MED ORDER — FENTANYL CITRATE (PF) 100 MCG/2ML IJ SOLN: 25.0000 ug | INTRAMUSCULAR | Status: DC | PRN

## 2016-07-27 MED ORDER — ACETAMINOPHEN 500 MG PO TABS
ORAL_TABLET | ORAL | Status: AC
  Filled 2016-07-27: qty 2

## 2016-07-27 SURGICAL SUPPLY — 52 items
ADH SKN CLS APL DERMABOND .7 (GAUZE/BANDAGES/DRESSINGS) ×1
BLADE CLIPPER SURG (BLADE) ×2 IMPLANT
BLADE SURG 15 STRL LF DISP TIS (BLADE) ×1 IMPLANT
BLADE SURG 15 STRL SS (BLADE) ×3
CANISTER SUCT 1200ML W/VALVE (MISCELLANEOUS) IMPLANT
CHLORAPREP W/TINT 26ML (MISCELLANEOUS) ×1 IMPLANT
CLOSURE STERI-STRIP 1/2X4 (GAUZE/BANDAGES/DRESSINGS) ×1
CLSR STERI-STRIP ANTIMIC 1/2X4 (GAUZE/BANDAGES/DRESSINGS) ×2 IMPLANT
COVER BACK TABLE 60X90IN (DRAPES) ×3 IMPLANT
COVER MAYO STAND STRL (DRAPES) ×3 IMPLANT
DECANTER SPIKE VIAL GLASS SM (MISCELLANEOUS) IMPLANT
DERMABOND ADVANCED (GAUZE/BANDAGES/DRESSINGS) ×2
DERMABOND ADVANCED .7 DNX12 (GAUZE/BANDAGES/DRESSINGS) ×1 IMPLANT
DRAPE LAPAROTOMY 100X72 PEDS (DRAPES) ×3 IMPLANT
DRSG TEGADERM 4X4.75 (GAUZE/BANDAGES/DRESSINGS) IMPLANT
ELECT COATED BLADE 2.86 ST (ELECTRODE) ×2 IMPLANT
ELECT REM PT RETURN 9FT ADLT (ELECTROSURGICAL) ×3
ELECTRODE REM PT RTRN 9FT ADLT (ELECTROSURGICAL) ×1 IMPLANT
GAUZE PACKING IODOFORM 1/4X15 (GAUZE/BANDAGES/DRESSINGS) IMPLANT
GLOVE BIO SURGEON STRL SZ7 (GLOVE) ×3 IMPLANT
GLOVE BIOGEL PI IND STRL 7.0 (GLOVE) IMPLANT
GLOVE BIOGEL PI IND STRL 7.5 (GLOVE) ×1 IMPLANT
GLOVE BIOGEL PI INDICATOR 7.0 (GLOVE) ×4
GLOVE BIOGEL PI INDICATOR 7.5 (GLOVE) ×2
GLOVE ECLIPSE 6.5 STRL STRAW (GLOVE) ×2 IMPLANT
GOWN STRL REUS W/ TWL LRG LVL3 (GOWN DISPOSABLE) ×3 IMPLANT
GOWN STRL REUS W/TWL LRG LVL3 (GOWN DISPOSABLE) ×9
NDL HYPO 25X1 1.5 SAFETY (NEEDLE) ×1 IMPLANT
NEEDLE HYPO 25X1 1.5 SAFETY (NEEDLE) ×3 IMPLANT
NS IRRIG 1000ML POUR BTL (IV SOLUTION) IMPLANT
PACK BASIN DAY SURGERY FS (CUSTOM PROCEDURE TRAY) ×3 IMPLANT
PENCIL BUTTON HOLSTER BLD 10FT (ELECTRODE) ×3 IMPLANT
SLEEVE SCD COMPRESS KNEE MED (MISCELLANEOUS) ×1 IMPLANT
SPONGE GAUZE 4X4 12PLY STER LF (GAUZE/BANDAGES/DRESSINGS) IMPLANT
SUT ETHILON 2 0 FS 18 (SUTURE) IMPLANT
SUT MNCRL AB 4-0 PS2 18 (SUTURE) ×3 IMPLANT
SUT SILK 2 0 SH (SUTURE) IMPLANT
SUT VIC AB 2-0 SH 27 (SUTURE)
SUT VIC AB 2-0 SH 27XBRD (SUTURE) IMPLANT
SUT VIC AB 3-0 SH 27 (SUTURE) ×3
SUT VIC AB 3-0 SH 27X BRD (SUTURE) IMPLANT
SUT VICRYL 3-0 CR8 SH (SUTURE) IMPLANT
SUT VICRYL 4-0 PS2 18IN ABS (SUTURE) IMPLANT
SWAB COLLECTION DEVICE MRSA (MISCELLANEOUS) IMPLANT
SWAB CULTURE ESWAB REG 1ML (MISCELLANEOUS) IMPLANT
SYR CONTROL 10ML LL (SYRINGE) ×3 IMPLANT
TOWEL OR 17X24 6PK STRL BLUE (TOWEL DISPOSABLE) ×3 IMPLANT
TOWEL OR NON WOVEN STRL DISP B (DISPOSABLE) ×3 IMPLANT
TUBE CONNECTING 20'X1/4 (TUBING)
TUBE CONNECTING 20X1/4 (TUBING) IMPLANT
UNDERPAD 30X30 (UNDERPADS AND DIAPERS) IMPLANT
YANKAUER SUCT BULB TIP NO VENT (SUCTIONS) IMPLANT

## 2016-07-27 NOTE — Anesthesia Preprocedure Evaluation (Signed)
Anesthesia Evaluation  Patient identified by MRN, date of birth, ID band Patient awake    Reviewed: Allergy & Precautions, NPO status , Patient's Chart, lab work & pertinent test results, reviewed documented beta blocker date and time   Airway Mallampati: II  TM Distance: <3 FB Neck ROM: Full    Dental  (+) Teeth Intact, Dental Advisory Given   Pulmonary neg pulmonary ROS,    Pulmonary exam normal breath sounds clear to auscultation       Cardiovascular hypertension, Pt. on home beta blockers Normal cardiovascular exam Rhythm:Regular Rate:Normal     Neuro/Psych PSYCHIATRIC DISORDERS Depression  Neuromuscular disease (Chemo-induced peripheral neuropathy)    GI/Hepatic Neg liver ROS, GERD  Controlled and Medicated,  Endo/Other  negative endocrine ROS  Renal/GU negative Renal ROS     Musculoskeletal negative musculoskeletal ROS (+)   Abdominal   Peds  Hematology negative hematology ROS (+)   Anesthesia Other Findings Day of surgery medications reviewed with the patient.  Breast cancer w/ mets  Reproductive/Obstetrics                             Anesthesia Physical Anesthesia Plan  ASA: III  Anesthesia Plan: MAC   Post-op Pain Management:    Induction: Intravenous  Airway Management Planned: Simple Face Mask  Additional Equipment:   Intra-op Plan:   Post-operative Plan:   Informed Consent: I have reviewed the patients History and Physical, chart, labs and discussed the procedure including the risks, benefits and alternatives for the proposed anesthesia with the patient or authorized representative who has indicated his/her understanding and acceptance.   Dental advisory given  Plan Discussed with: CRNA  Anesthesia Plan Comments: (Discussed risks/benefits/alternatives to MAC sedation including need for ventilatory support, hypotension, need for conversion to general anesthesia.   All patient questions answered.  Patient/guardian wishes to proceed.)        Anesthesia Quick Evaluation

## 2016-07-27 NOTE — Op Note (Signed)
Preoperative diagnosis: History stage IV breast cancer, posterior neck Mass. Postoperative diagnosis: Same as above Procedure: Excision of posterior neck lymph node Surgeon: Dr. Serita Grammes Anesthesia: Local with sedation Specimens: Left posterior neck skin and subcutaneous tissue, posterior neck lymph node Estimated blood loss: Minimal Crepitations: None Drains: None Sponge and needle count was correct 2 and operation Disposition to recovery stable  Indications: This is a 65 year old female in no well-developed history stage IV breast cancer. She is overall doing well but has a new mass in her posterior neck. We discussed excising this area.  Procedure: After informed consent was obtained the patient was taken to the operating room. She was given antibiotics. She was placed under monitored anesthesia care. She was then placed in the right lateral decubitus position and appropriately padded. She was prepped and draped in the standard sterile surgical fashion. A surgical timeout was then performed.  I infiltrated a mixture of 1% lidocaine with epinephrine and quarter percent Marcaine overlying the mass. This had previously been marked. I made an elliptical incision as it felt like it might be adhered to the skin. I then dissected down and it became clear that the mass was a lymph node below this area. I then removed the skin and subcutaneous tissue. The lymph node was removed separately and sent off separately. I then obtained hemostasis. This was then closed with 3-0 Vicryl, 4 Monocryl, and glue. She tolerated this well and was transferred to recovery stable condition.

## 2016-07-27 NOTE — Anesthesia Procedure Notes (Signed)
Procedure Name: MAC Date/Time: 07/27/2016 3:09 PM Performed by: Laporsha Grealish D Pre-anesthesia Checklist: Patient identified, Emergency Drugs available, Suction available, Patient being monitored and Timeout performed Patient Re-evaluated:Patient Re-evaluated prior to inductionOxygen Delivery Method: Simple face mask

## 2016-07-27 NOTE — H&P (Signed)
[name] is an 65 y.o. female.   Chief Complaint: posterior neck mass HPI:  62 yof I know well from treatment for breast cancer. 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 she is overall well. she did fracture ribs on right after fall from horse. she remains active. she does on posterior left neck have a small nodule she is concerned is a node. Her last pet in 10/17 didnt really show metastatic disease that was active. she is here to discuss options for posterior neck lesion   Past Medical History:  Diagnosis Date  . 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 Bladder infection   . Bone metastasis ([place]) 01/17/2016  . Borderline hypertension   . Breast cancer ([place])   . Closed fracture of humerus sept 2013  . Depression   . Fatigue   . GERD (gastroesophageal reflux disease)   . Hypertension   . 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 Osteoporosis 01/27/2011  . Pneumonia   . Port catheter in place 04/14/2013  . Post-mastectomy lymphedema syndrome   . 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 Reflux   . Retinal detachment   . 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 Swollen lymph nodes   . URI (upper respiratory infection) 02/17/12  . Wears glasses     Past Surgical History:  Procedure Laterality Date  . biopsy of lymph node     super clavicle  . BTL    . CATARACT EXTRACTION    . cervical disc fusioni    . LYMPH NODE BIOPSY Right 06/19/2015   Procedure: RIGHT NECK LYMPH NODE EXCISION;  Surgeon: Rolm Bookbinder, MD;  Location: [place];  Service: General;  Laterality: Right;  . MASTECTOMY MODIFIED RADICAL     left breast  . Port- a-cath insertion    . retinal detachment and repair      Family History  Problem Relation Age of Onset  . Hypertension Mother   . Heart disease  Mother     atrial fib  . Heart disease Father   . Other Father     glaucoma   Social History:  reports that she has never smoked. She has never used smokeless tobacco. She reports that she drinks about 1.8 oz of alcohol per week . She reports that she uses drugs, including Marijuana.  Allergies:  Allergies  Allergen Reactions  . Ciprofloxacin Other (See Comments)    tendonitis    No prescriptions prior to admission.    No results found for this or any previous visit (from the past 48 hour(s)). No results found.  Review of Systems  Constitutional: Negative for chills and fever.  All other systems reviewed and are negative.   Height _0  (1.626 m), weight 62.6 kg (138 lb). Physical Exam   Vitals (Sonya Bynum CMA; 07/22/2016 4:44 PM) 07/22/2016 4:44 PM Weight: 138 lb Height: 64in Body Surface Area: 1.67 m Body Mass Index: 23.69 kg/m  Temp.: 102F(Temporal)  Pulse: 76 (Regular)  BP: 128/80 (Sitting, Left Arm, Standard Physical Exam Rolm Bookbinder MD; 07/22/2016 8:53 PM) General Mental Status-Alert. Orientation-Oriented X3. Head and Neck Note: left posterior neck near hairline is a 1 cm what appears to be subq nodule, cyst vs node, no infection, nontender Chest and Lung Exam  Note: clear bilaterally  Assessment/Plan BREAST CANCER, STAGE 4 (C50.919) Story: Im not sure if this is smal node or benign skin lesion. with history I think prudent as does she that we should just excise this area. It may not be large enough to show up on pet. will plan for next week to go to or under mac and excise the lesion. discussed procedure with [facility] today.   Rolm Bookbinder, MD 07/27/2016, 1:04 PM

## 2016-07-27 NOTE — Transfer of Care (Signed)
Immediate Anesthesia Transfer of Care Note  Patient: [name]  Procedure(s) Performed: Procedure(s): EXCISION OF POSTERIOR NECK MASS (N/A)  Patient Location: PACU  Anesthesia Type:MAC  Level of Consciousness: awake, alert , oriented and patient cooperative  Airway & Oxygen Therapy: Patient Spontanous Breathing  Post-op Assessment: Report given to RN and Post -op Vital signs reviewed and stable  Post vital signs: Reviewed and stable  Last Vitals:  Vitals:   07/27/16 1321  BP: (!) 153/61  Pulse: 69  Resp: 20  Temp: 36.9 C    Last Pain:  Vitals:   07/27/16 1321  TempSrc: Oral         Complications: No apparent anesthesia complications

## 2016-07-27 NOTE — Discharge Instructions (Signed)
[facility]  POST OP INSTRUCTIONS  Always review your discharge instruction sheet given to you by the facility where your surgery was performed. IF YOU HAVE DISABILITY OR FAMILY LEAVE FORMS, YOU MUST BRING THEM TO THE OFFICE FOR PROCESSING.   DO NOT GIVE THEM TO YOUR DOCTOR.  1. A prescription for pain medication may be given to you upon discharge.  Take your pain medication as prescribed, if needed.  If narcotic pain medicine is not needed, then you may take acetaminophen (Tylenol), naprosyn (Alleve), or ibuprofen (Advil) as needed. 2. Take your usually prescribed medications unless otherwise directed. 3. If you need a refill on your pain medication, please contact your pharmacy.  They will contact our office to request authorization. Prescriptions will not be filled after 5pm or on week-ends. 4. Most patients will experience some swelling and bruising in the area of the incisions.  Ice packs will help.  Swelling and bruising can take several days to resolve.  5. It is common to experience some constipation if taking pain medication after surgery.  Increasing fluid intake and taking a stool softener (such as Colace) will usually help or prevent this problem from occurring.  A mild laxative (Milk of Magnesia or Miralax) should be taken according to package instructions if there are no bowel movements after 48 hours. 6. Unless discharge instructions indicate otherwise, you may remove your bandages 48 hours after surgery, and you may shower at that time.  You may have steri-strips (small skin tapes) in place directly over the incision.  These strips should be left on the skin for 7-10 days.  If your surgeon used skin glue on the incision, you may shower in 24 hours.  The glue will flake off over the next 2-3 weeks.   7. ACTIVITIES:  You may resume regular (light) daily activities beginning the next day--such as daily self-care, walking, climbing  stairs--gradually increasing activities as tolerated.   a. You may drive when you are no longer taking prescription pain medication, you can comfortably wear a seatbelt, and you can safely maneuver your car and apply brakes. b. RETURN TO WORK:  __________________________________________________________ 8. You should see your doctor in the office for a follow-up appointment approximately 2-3 weeks after your surgery.  Make sure that you call for this appointment within a day or two after you arrive home to insure a convenient appointment time. 9. OTHER INSTRUCTIONS: __________________________________________________________________________________________________________________________ __________________________________________________________________________________________________________________________ WHEN TO CALL YOUR DOCTOR: 1. Fever over 101.0 2. Continued bleeding from incision. 3. Increased pain, redness, or drainage from the incision.   The clinic staff is available to answer your questions during regular business hours.  Please dont hesitate to call and ask to speak to one of the nurses for clinical concerns.  If you have a medical emergency, go to the nearest emergency room or call 911.  A surgeon from [facility] is always on call at the hospital. [address] [place], [place], [place]  [address] ? [address], [place], [place]   [address] [phone] ? [phone] ? FAX [phone] Web site: [email or web address]    Post Anesthesia Home Care Instructions  Activity: Get plenty of rest for the remainder of the day. A responsible adult should stay with you for 24 hours following the procedure.  For the next 24 hours, DO NOT: -Drive a car -Paediatric nurse -Drink alcoholic beverages -Take any medication unless instructed by your physician -Make any legal decisions or sign important papers.  Meals:  Start with liquid foods such as gelatin  or soup. Progress to regular foods as tolerated. Avoid greasy, spicy, heavy foods. If nausea and/or vomiting occur, drink only clear liquids until the nausea and/or vomiting subsides. Call your physician if vomiting continues.  Special Instructions/Symptoms: Your throat may feel dry or sore from the anesthesia or the breathing tube placed in your throat during surgery. If this causes discomfort, gargle with warm salt water. The discomfort should disappear within 24 hours.  If you had a scopolamine patch placed behind your ear for the management of post- operative nausea and/or vomiting:  1. The medication in the patch is effective for 72 hours, after which it should be removed.  Wrap patch in a tissue and discard in the trash. Wash hands thoroughly with soap and water. 2. You may remove the patch earlier than 72 hours if you experience unpleasant side effects which may include dry mouth, dizziness or visual disturbances. 3. Avoid touching the patch. Wash your hands with soap and water after contact with the patch.    Call your surgeon if you experience:   1.  Fever over 101.0. 2.  Inability to urinate. 3.  Nausea and/or vomiting. 4.  Extreme swelling or bruising at the surgical site. 5.  Continued bleeding from the incision. 6.  Increased pain, redness or drainage from the incision. 7.  Problems related to your pain medication. 8.  Any problems and/or concerns

## 2016-07-27 NOTE — Interval H&P Note (Signed)
History and Physical Interval Note:  07/27/2016 2:43 PM  [name]  has presented today for surgery, with the diagnosis of NECK MASS, HISTORY OF BREAST CANCER  The various methods of treatment have been discussed with the patient and family. After consideration of risks, benefits and other options for treatment, the patient has consented to  Procedure(s): EXCISION OF POSTERIOR NECK MASS (N/A) as a surgical intervention .  The patient's history has been reviewed, patient examined, no change in status, stable for surgery.  I have reviewed the patient's chart and labs.  Questions were answered to the patient's satisfaction.     Wandalene Abrams

## 2016-07-28 ENCOUNTER — Ambulatory Visit (INDEPENDENT_AMBULATORY_CARE_PROVIDER_SITE_OTHER): Payer: BLUE CROSS/BLUE SHIELD | Admitting: Orthopaedic Surgery

## 2016-07-28 ENCOUNTER — Encounter (HOSPITAL_BASED_OUTPATIENT_CLINIC_OR_DEPARTMENT_OTHER): Payer: Self-pay | Admitting: General Surgery

## 2016-07-28 DIAGNOSIS — S42021D Displaced fracture of shaft of right clavicle, subsequent encounter for fracture with routine healing: Secondary | ICD-10-CM

## 2016-07-28 NOTE — Progress Notes (Signed)
CC:  My collar bone is OK  She has no problem with the right clavicle now.  She is back to regular activities including riding a horse.  NV is intact. She has full ROM of the right shoulder and no pain.  Encounter Diagnosis  Name Primary?  . Closed displaced fracture of shaft of right clavicle with routine healing, subsequent encounter Yes   Discharge.  Electronically Signed Sanjuana Kava, MD 12/19/20172:44 PM

## 2016-07-28 NOTE — Anesthesia Postprocedure Evaluation (Signed)
Anesthesia Post Note  Patient: [name]  Procedure(s) Performed: Procedure(s) (LRB): EXCISION OF POSTERIOR NECK MASS (Left)  Patient location during evaluation: PACU Anesthesia Type: MAC Level of consciousness: awake and alert Pain management: pain level controlled Vital Signs Assessment: post-procedure vital signs reviewed and stable Respiratory status: spontaneous breathing, nonlabored ventilation, respiratory function stable and patient connected to nasal cannula oxygen Cardiovascular status: stable and blood pressure returned to baseline Anesthetic complications: no       Last Vitals:  Vitals:   07/27/16 1600 07/27/16 1614  BP: 114/60 130/72  Pulse: 63 62  Resp: 14 16  Temp:  36.9 C    Last Pain:  Vitals:   07/27/16 1321  TempSrc: Oral                 Catalina Gravel

## 2016-07-29 ENCOUNTER — Encounter (HOSPITAL_COMMUNITY): Payer: BLUE CROSS/BLUE SHIELD

## 2016-08-04 ENCOUNTER — Encounter (HOSPITAL_COMMUNITY): Payer: BLUE CROSS/BLUE SHIELD | Admitting: Occupational Therapy

## 2016-08-06 ENCOUNTER — Encounter (HOSPITAL_COMMUNITY): Payer: BLUE CROSS/BLUE SHIELD | Admitting: Occupational Therapy

## 2016-08-11 ENCOUNTER — Encounter (HOSPITAL_BASED_OUTPATIENT_CLINIC_OR_DEPARTMENT_OTHER): Payer: BLUE CROSS/BLUE SHIELD | Admitting: Hematology & Oncology

## 2016-08-11 ENCOUNTER — Other Ambulatory Visit (HOSPITAL_COMMUNITY): Payer: Self-pay | Admitting: Pharmacist

## 2016-08-11 ENCOUNTER — Encounter (HOSPITAL_COMMUNITY): Payer: Self-pay | Admitting: Hematology & Oncology

## 2016-08-11 ENCOUNTER — Encounter (HOSPITAL_COMMUNITY): Payer: BLUE CROSS/BLUE SHIELD | Attending: Adult Health

## 2016-08-11 VITALS — BP 153/54 | HR 76 | Temp 98.2°F | Resp 18 | Wt 139.6 lb

## 2016-08-11 DIAGNOSIS — C50912 Malignant neoplasm of unspecified site of left female breast: Secondary | ICD-10-CM | POA: Diagnosis not present

## 2016-08-11 DIAGNOSIS — G4700 Insomnia, unspecified: Secondary | ICD-10-CM

## 2016-08-11 DIAGNOSIS — C210 Malignant neoplasm of anus, unspecified: Secondary | ICD-10-CM

## 2016-08-11 DIAGNOSIS — C50919 Malignant neoplasm of unspecified site of unspecified female breast: Secondary | ICD-10-CM

## 2016-08-11 DIAGNOSIS — G893 Neoplasm related pain (acute) (chronic): Secondary | ICD-10-CM

## 2016-08-11 DIAGNOSIS — C7951 Secondary malignant neoplasm of bone: Secondary | ICD-10-CM

## 2016-08-11 DIAGNOSIS — F5102 Adjustment insomnia: Secondary | ICD-10-CM

## 2016-08-11 DIAGNOSIS — Z1509 Genetic susceptibility to other malignant neoplasm: Secondary | ICD-10-CM

## 2016-08-11 DIAGNOSIS — Z452 Encounter for adjustment and management of vascular access device: Secondary | ICD-10-CM

## 2016-08-11 DIAGNOSIS — Z1501 Genetic susceptibility to malignant neoplasm of breast: Secondary | ICD-10-CM

## 2016-08-11 DIAGNOSIS — Z171 Estrogen receptor negative status [ER-]: Secondary | ICD-10-CM

## 2016-08-11 LAB — CBC WITH DIFFERENTIAL/PLATELET
Basophils Absolute: 0 10*3/uL (ref 0.0–0.1)
Basophils Relative: 0 %
Eosinophils Absolute: 0.1 10*3/uL (ref 0.0–0.7)
Eosinophils Relative: 1 %
HCT: 36.9 % (ref 36.0–46.0)
Hemoglobin: 12.4 g/dL (ref 12.0–15.0)
Lymphocytes Relative: 12 %
Lymphs Abs: 1 10*3/uL (ref 0.7–4.0)
MCH: 38 pg — ABNORMAL HIGH (ref 26.0–34.0)
MCHC: 33.6 g/dL (ref 30.0–36.0)
MCV: 113.2 fL — ABNORMAL HIGH (ref 78.0–100.0)
Monocytes Absolute: 0.7 10*3/uL (ref 0.1–1.0)
Monocytes Relative: 9 %
Neutro Abs: 6.4 10*3/uL (ref 1.7–7.7)
Neutrophils Relative %: 78 %
Platelets: 220 10*3/uL (ref 150–400)
RBC: 3.26 MIL/uL — ABNORMAL LOW (ref 3.87–5.11)
RDW: 14.7 % (ref 11.5–15.5)
WBC: 8.2 10*3/uL (ref 4.0–10.5)

## 2016-08-11 LAB — COMPREHENSIVE METABOLIC PANEL
ALT: 17 U/L (ref 14–54)
AST: 23 U/L (ref 15–41)
Albumin: 3.8 g/dL (ref 3.5–5.0)
Alkaline Phosphatase: 79 U/L (ref 38–126)
Anion gap: 5 (ref 5–15)
BUN: 15 mg/dL (ref 6–20)
CO2: 30 mmol/L (ref 22–32)
Calcium: 9.5 mg/dL (ref 8.9–10.3)
Chloride: 102 mmol/L (ref 101–111)
Creatinine, Ser: 0.92 mg/dL (ref 0.44–1.00)
GFR calc Af Amer: >60 mL/min (ref >60)
GFR calc non Af Amer: >60 mL/min (ref >60)
Glucose, Bld: 109 mg/dL — ABNORMAL HIGH (ref 65–99)
Potassium: 3.8 mmol/L (ref 3.5–5.1)
Sodium: 137 mmol/L (ref 135–145)
Total Bilirubin: 0.8 mg/dL (ref 0.3–1.2)
Total Protein: 6.8 g/dL (ref 6.5–8.1)

## 2016-08-11 MED ORDER — MIRTAZAPINE 30 MG PO TABS: 30.0000 mg | ORAL_TABLET | Freq: Every day | ORAL | 2 refills | Status: DC

## 2016-08-11 MED ORDER — OLAPARIB 150 MG PO TABS: 300.0000 mg | ORAL_TABLET | Freq: Two times a day (BID) | ORAL | 2 refills | Status: DC

## 2016-08-11 MED ORDER — FENTANYL 25 MCG/HR TD PT72
25.0000 ug | MEDICATED_PATCH | TRANSDERMAL | 0 refills | Status: DC
Start: 2016-08-11 — End: 2016-08-25

## 2016-08-11 MED ORDER — SODIUM CHLORIDE 0.9% FLUSH
20.0000 mL | INTRAVENOUS | Status: DC | PRN
  Administered 2016-08-11: 20 mL via INTRAVENOUS
  Filled 2016-08-11: qty 20

## 2016-08-11 MED ORDER — HEPARIN SOD (PORK) LOCK FLUSH 100 UNIT/ML IV SOLN
500.0000 [IU] | Freq: Once | INTRAVENOUS | Status: AC
  Administered 2016-08-11: 500 [IU] via INTRAVENOUS

## 2016-08-11 MED ORDER — FENTANYL 50 MCG/HR TD PT72: 50.0000 ug | MEDICATED_PATCH | TRANSDERMAL | 0 refills | Status: DC

## 2016-08-11 NOTE — Progress Notes (Signed)
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      04/09/2008 Procedure    Biopsy of left axillary node mass       04/16/2008 - 08/13/2008 Chemotherapy    Dose dense epirubicin/Cytoxan 4 cycles followed by docetaxel 4 cycles. Herceptin initiated with docetaxel for a total of 52 weeks.      08/30/2008 Definitive Surgery    Left mastectomy revealing persistence of disease in the breast and lymph nodes.      09/17/2008 - 11/26/2008 Radiation Therapy    Radiation to left chest wall, supraclavicular, and axillary nodal areas.      11/27/2008 - 04/20/2011 Anti-estrogen oral therapy    Letrozole      03/30/2011 Procedure    Left supraclavicular lymph node biopsy      03/30/2011 Pathology Results    Recurrent breast cancer, invasive ductal type, ER 100%, PR 40%, HER-2 NEGATIVE      04/21/2011 - 06/04/2014 Anti-estrogen oral therapy    Tamoxifen      05/25/2014 PET scan    Progression in left submandibular lymph node, mediastinal lymph nodes, and probable lung involvement.      05/25/2014 Progression    PET scan demonstrates progression of disease.      06/13/2014 Procedure    Left supraclavicular mass biopsy      06/13/2014 Pathology Results    Metastatic carcinoma consistent with breast primary, involving skeletal muscle, perineural invasion, LVI, no lymphoid tissue identified, ER 98%, PR negative, HER-2 negative      06/25/2014 - 10/09/2014 Chemotherapy   Carboplatin/Taxol 6 cycles with excellent response      11/06/2014 - 06/10/2015 Chemotherapy    Palbociclib and Faslodex      01/08/2015 Adverse Reaction    Grade 3 neutropenia with cycle #2 of Palbociclib.      01/08/2015 Treatment Plan Change    Palbociclib dose reduced to 100 mg for cycle #3.      06/10/2015 Progression    Right cervical lymph node progression clinically developed over 4 days.  This lymph node was positive on PET scan in September 2016 and at that time was approximately 8 mm with possible progression in a single bone metastasis.      06/17/2015 Procedure    Biopsy of right cervical lymph node      06/17/2015 Pathology Results    Genetic sequencing performed revealing BRCA2 positivity      07/02/2015 - 08/01/2015 Chemotherapy    Exemestane/everolimus, 25 mg/ 5 mg x 7 days then increasing to 10 mg daily.      08/01/2015 PET scan    Diffuse pneumonitis.        08/01/2015 Adverse Reaction    Pneumonitis secondary to drug reaction from everolimus.      08/23/2015 - 12/23/2015 Chemotherapy    Cisplatin 40 mg/m every other week      11/15/2015 PET scan    Interval resolution of hypermetabolic  airspace opacities in the lungs which were likely inflammatory. Abnormal high activity at the anal rectal junction. Very low grade residual activity at the site of L3 metastatic lesion.      12/23/2015 Treatment Plan Change    Holiday from treatment in preparation for trip to [place].      04/20/2016 -  Chemotherapy    XELODA 1300 mg po bid 7 days on/ 7 days off       05/15/2016 PET scan    1. Partial metabolic response at the hypermetabolic anorectal junction malignancy. 2. No hypermetabolic metastatic disease. Lumbar vertebral sclerotic osseous lesions are non hypermetabolic. 3. Hypermetabolism associated with acute/early subacute fractures of the right clavicle and right anterior second through fifth ribs, without associated bone lesions. 4. Aortic  atherosclerosis.  Three-vessel coronary atherosclerosis.       Squamous cell carcinoma of anus ([place])   12/31/2015 Procedure    Colonoscopy by Dr. Britta Mccreedy      12/31/2015 Pathology Results    Invasive squamous cell carcinoma approximate 4 cm mass posterior wall of rectum      01/13/2016 Treatment Plan Change    Dr. Leighton Ruff consultation- no surgery.  Recommend chemoXRT      01/16/2016 Pathology Results    HPV POSITIVE      02/07/2016 - 03/10/2016 Chemotherapy    Xeloda 1450 mg PO BID _0 /15/2017 PET scan    1. Intense metabolic activity associated mucosal thickening in the anal rectal junction is consistent with residual carcinoma. Mild increase in metabolic activity. 2. No hypermetabolic lymph nodes in the pelvis. 3. No evidence distant metastatic disease. 4. No evidence breast cancer recurrence.       HISTORY OF PRESENT ILLNESS:   [name] is a 66 y.o. female with a medical history significant for Stage IV ER/PR+ breast cancer with initial diagnosis showing HER2 POSITIVITY initially diagnosed with Stage IIIC disease in 2012 and treated curative intent with metastatic disease discovered in October 2015 and osseous involvement noted on PET imaging on 09/10/2014, who is here for a follow up of Stage IV breast cancer and  squamous cell carcinoma of anus. She completed treatment of her anal carcinoma with XELODA/XRT and continues on XELODA therapy. At her last visit she noted a small palpable LN at the  posterior aspect of her neck. This has been biopsied by Dr. Donne Hazel and positive for metastatic breast cancer, prognostic markers are noted above.   Patient is accompanied by her sister today. She is here to discuss recent imaging results, biopsy results.   I personally reviewed and went over PET and biopsy results with the patient and her family.   She is taking fentanyl and other pain medication and it is not managing her pain. At first the fentanyl was helping but now it only lasts until mid-day. Then she takes tramadol and half a hydrocodone at night. She reports  rectal pain and low back pain. She notes that her rectal pain has been increasing significantly over the past 2 weeks. She is agreeable to increasing the dose of her fentanyl. She has enough hydrocodone and does not need a refill at this time.  She reports frequency of bowel movements.   She reports itching all the time across the back of her neck. She associates this with taking hydrocodone.  She reports she is not sleeping well between being up using the bathroom and feeling anxious and depressed. She has tried coming off Lexapro before and was not successful secondary to side effects.   She is hardly eating, but makes herself eat.    Review of Systems  Constitutional: Negative.  Negative for chills, fever and weight loss.  HENT: Negative.  Negative for hearing loss and tinnitus.   Eyes: Negative.  Negative for blurred vision and double vision.  Respiratory: Negative.  Negative for cough, hemoptysis, sputum production, shortness of breath and wheezing.   Cardiovascular: Negative.  Negative for chest pain.  Gastrointestinal: Negative for constipation, melena, nausea and vomiting.       Frequency of bowel movements   Genitourinary: Negative.  Negative for dysuria, frequency, hematuria and urgency.  Musculoskeletal: Positive for back pain. Negative for falls, joint pain and myalgias.       Rectal pain  Skin: Positive for  itching (Persistent itching of the back of neck).  Neurological: Positive for tingling. Negative for dizziness, sensory change, speech change, focal weakness, seizures, loss of consciousness, weakness and headaches.       Hand and foot neuropathy; worsening foot neuropathy  Endo/Heme/Allergies: Negative.   Psychiatric/Behavioral: Positive for depression. The patient is nervous/anxious.   14 point review of systems was performed and is negative except as detailed under history of present illness and above  PAST MEDICAL HISTORY:   Past Medical History:  Diagnosis Date  . Adenocarcinoma of breast ([place]) 01/27/2011   stage IIIc breast cancer  . Bladder infection   . Bone metastasis ([place]) 01/17/2016  . Borderline hypertension   . Breast cancer ([place])   . Closed fracture of humerus sept 2013  . Depression   . Fatigue   . GERD (gastroesophageal reflux disease)   . Hypertension   . 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 Osteoporosis 01/27/2011  . Pneumonia   . Port catheter in place 04/14/2013  . Post-mastectomy lymphedema syndrome   . 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 Reflux   . Retinal detachment   . 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 Swollen lymph nodes   . URI (upper respiratory infection) 02/17/12  . Wears glasses     ALLERGIES: Allergies  Allergen Reactions  . Ciprofloxacin Other (See Comments)    tendonitis      MEDICATIONS: I have reviewed the patient's current medications.    Current Outpatient Prescriptions on File Prior to Visit  Medication Sig Dispense Refill  . Acetylcarnitine HCl (ACETYL L-CARNITINE PO) Take 1,000 mg by mouth daily.    . calcium-vitamin D (OSCAL WITH D) 500-200 MG-UNIT per tablet Take 2 tablets by mouth daily.     . capecitabine (XELODA) 150 MG tablet Take 1300 mg twice a day 7 days on and 7 days off. 56 tablet 0  . capecitabine (XELODA) 500 MG tablet Take  1300 mg twice a day 7 days on and 7 days off 56 tablet  0  . diphenhydrAMINE (SOMINEX) 25 MG tablet Take 25 mg by mouth at bedtime as needed. To help with sleeping    . escitalopram (LEXAPRO) 10 MG tablet Take 15 mg by mouth daily.   5  . magnesium oxide (MAG-OX) 400 MG tablet Take 400 mg by mouth 2 (two) times daily.    . metoprolol succinate (TOPROL-XL) 25 MG 24 hr tablet TK 1 T QD  5  . nitrofurantoin (MACRODANTIN) 100 MG capsule Take 1 capsule (100 mg total) by mouth daily. 90 capsule 1  . omeprazole (PRILOSEC) 40 MG capsule Take 1 capsule (40 mg total) by mouth 2 (two) times daily. 180 capsule 4  . vitamin B-12 (CYANOCOBALAMIN) 1000 MCG tablet Take 1,000 mcg by mouth daily.     No current facility-administered medications on file prior to visit.      PAST SURGICAL HISTORY Past Surgical History:  Procedure Laterality Date  . biopsy of lymph node     super clavicle  . BTL    . CATARACT EXTRACTION    . cervical disc fusioni    . LYMPH NODE BIOPSY Right 06/19/2015   Procedure: RIGHT NECK LYMPH NODE EXCISION;  Surgeon: Rolm Bookbinder, MD;  Location: [facility];  Service: General;  Laterality: Right;  . MASS EXCISION Left 07/27/2016   Procedure: EXCISION OF POSTERIOR NECK MASS;  Surgeon: Rolm Bookbinder, MD;  Location: [facility];  Service: General;  Laterality: Left;  Marland Kitchen MASTECTOMY MODIFIED RADICAL     left breast  . Port- a-cath insertion    . retinal detachment and repair      FAMILY HISTORY: Family History  Problem Relation Age of Onset  . Hypertension Mother   . Heart disease Mother     atrial fib  . Heart disease Father   . Other Father     glaucoma    SOCIAL HISTORY:  reports that she has never smoked. She has never used smokeless tobacco. She reports that she drinks about 1.8 oz of alcohol per week . She reports that she uses drugs, including Marijuana.  Social History   Social History  . Marital status: Divorced    Spouse name:  N/A  . Number of children: N/A  . Years of education: N/A   Social History Main Topics  . Smoking status: Never Smoker  . Smokeless tobacco: Never Used  . Alcohol use 1.8 oz/week    3 Glasses of wine per week     Comment: Drinks 3 drinks very other week  . Drug use:     Types: Marijuana     Comment: During chemo, none over 1 Year  . Sexual activity: No   Other Topics Concern  . None   Social History Narrative  . None    PERFORMANCE STATUS: The patient's performance status is 1 - Symptomatic but completely ambulatory  PHYSICAL EXAM: Most Recent Vital Signs: Blood pressure (!) 153/54, pulse 76, temperature 98.2 F (36.8 C), temperature source Oral, resp. rate 18, weight 139 lb 9.6 oz (63.3 kg), SpO2 100 %. Physical Exam  Constitutional: She is oriented to person, place, and time and well-developed, well-nourished, and in no distress.  HENT:  Head: Normocephalic and atraumatic.  Mouth/Throat: Oropharynx is clear and moist.  Eyes: Conjunctivae and EOM are normal. Pupils are equal, round, and reactive to light.  Neck: Normal range of motion. Neck supple.  Well healing incision at surgical site  Cardiovascular: Normal rate, regular rhythm and normal heart sounds.  Pulmonary/Chest: Effort normal and breath sounds normal.  Abdominal: Soft. Bowel sounds are normal.  Musculoskeletal: Normal range of motion.  Neurological: She is alert and oriented to person, place, and time. Gait normal.  Skin: Skin is warm and dry.  Nursing note and vitals reviewed.   LABORATORY DATA:  I have reviewed the data as listed.  Results for RUCHA, WISSINGER (MRN [id number]) as of 08/11/2016 09:42 Results for SHANELL, ADEN (MRN [id number]) as of 08/11/2016 17:37  Ref. Range 08/11/2016 09:57  Sodium Latest Ref Range: 135 - 145 mmol/L 137  Potassium Latest Ref Range: 3.5 - 5.1 mmol/L 3.8  Chloride Latest Ref Range: 101 - 111 mmol/L 102  CO2 Latest Ref Range: 22 - 32 mmol/L 30  BUN Latest Ref Range: 6 -  20 mg/dL 15  Creatinine Latest Ref Range: 0.44 - 1.00 mg/dL 0.92  Calcium Latest Ref Range: 8.9 - 10.3 mg/dL 9.5  EGFR (Non-African Amer.) Latest Ref Range: >60 mL/min >60  EGFR (African American) Latest Ref Range: >60 mL/min >60  Glucose Latest Ref Range: 65 - 99 mg/dL 109 (H)  Anion gap Latest Ref Range: 5 - 15  5  Alkaline Phosphatase Latest Ref Range: 38 - 126 U/L 79  Albumin Latest Ref Range: 3.5 - 5.0 g/dL 3.8  AST Latest Ref Range: 15 - 41 U/L 23  ALT Latest Ref Range: 14 - 54 U/L 17  Total Protein Latest Ref Range: 6.5 - 8.1 g/dL 6.8  Total Bilirubin Latest Ref Range: 0.3 - 1.2 mg/dL 0.8  WBC Latest Ref Range: 4.0 - 10.5 K/uL 8.2  RBC Latest Ref Range: 3.87 - 5.11 MIL/uL 3.26 (L)  Hemoglobin Latest Ref Range: 12.0 - 15.0 g/dL 12.4  HCT Latest Ref Range: 36.0 - 46.0 % 36.9  MCV Latest Ref Range: 78.0 - 100.0 fL 113.2 (H)  MCH Latest Ref Range: 26.0 - 34.0 pg 38.0 (H)  MCHC Latest Ref Range: 30.0 - 36.0 g/dL 33.6  RDW Latest Ref Range: 11.5 - 15.5 % 14.7  Platelets Latest Ref Range: 150 - 400 K/uL 220  Neutrophils Latest Units: % 78  Lymphocytes Latest Units: % 12  Monocytes Relative Latest Units: % 9  Eosinophil Latest Units: % 1  Basophil Latest Units: % 0  NEUT# Latest Ref Range: 1.7 - 7.7 K/uL 6.4  Lymphocyte # Latest Ref Range: 0.7 - 4.0 K/uL 1.0  Monocyte # Latest Ref Range: 0.1 - 1.0 K/uL 0.7  Eosinophils Absolute Latest Ref Range: 0.0 - 0.7 K/uL 0.1  Basophils Absolute Latest Ref Range: 0.0 - 0.1 K/uL 0.0    RADIOGRAPHY: I have personally reviewed the radiological images as listed and agreed with the findings in the report. Study Result   CLINICAL DATA:  Subsequent treatment strategy for squamous cell anal carcinoma. Additional history of LEFT breast cancer.  EXAM: NUCLEAR MEDICINE PET SKULL BASE TO THIGH  TECHNIQUE: 6.9 mCi F-18 FDG was injected intravenously. Full-ring PET imaging was performed from the skull base to thigh after the radiotracer.  CT data was obtained and used for attenuation correction and anatomic localization.  FASTING BLOOD GLUCOSE:  Value: 100 mg/dl  COMPARISON:  PET-CT 05/15/2016  FINDINGS: NECK  No hypermetabolic lymph nodes in the neck.  CHEST  No hypermetabolic axillary supraclavicular adenopathy. No hypermetabolic mediastinal adenopathy.  Port in the RIGHT chest.  Angular nodule in the RIGHT upper lobe measures 10 mm. No change from prior and no metabolic activity.  ABDOMEN/PELVIS  No abnormal hypermetabolic activity within the liver, pancreas,  adrenal glands, or spleen. No hypermetabolic lymph nodes in the abdomen or pelvis.  Intense metabolic activity at the anorectal junction with associated mucosal thickening. Single wall thickening measures 12 mm compared to approximately 9 mm. Metabolic activity associated with an rectal thickening is mildly increased SUV max equal 12.5 increased from 11.7.  SKELETON  No focal hypermetabolic activity to suggest skeletal metastasis. Rule decrease in metabolic activity associated with the LEFT clavicle fracture.  IMPRESSION: 1. Intense metabolic activity associated mucosal thickening in the anal rectal junction is consistent with residual carcinoma. Mild increase in metabolic activity. 2. No hypermetabolic lymph nodes in the pelvis. 3. No evidence distant metastatic disease. 4. No evidence breast cancer recurrence.   Electronically Signed   By: Suzy Bouchard M.D.   On: 07/24/2016 14:51    PATHOLOGY:        ASSESSMENT/PLAN:  Excellent PS Squamous cell carcinoma of anus ([facility]) Adenocarcinoma of left breast BRCA2 Positive XELODA related skin toxicity Cancer related pain  Stage IV breast cancer ER/PR positive, HER-2/neu negative. BRCA2 POSITIVE.  History of stage IIIc left breast cancer in 2009 treated with neoadjuvant chemotherapy consisting of epirubicin and Cytoxan followed by 4 cycles of docetaxel from  04/16/2008-08/13/2008 with the initiation of Herceptin for 52 weeks at the start of docetaxel. She then underwent a left mastectomy on 08/30/2008 showing persistent disease in the breast and lymph nodes. This was followed by radiation therapy from 09/17/2008-11/26/2008 to the left chest wall, supraclavicular, and axillary node areas. She then moved on to antiestrogen therapy with letrozole (11/27/2008-04/20/2011). She was found to have recurrent breast cancer on supraclavicular lymph node biopsy on 03/30/2011 leading to a change in antiestrogen therapy to tamoxifen (04/21/2011-06/04/2014). Pet imaging on 05/25/2014 demonstrated progression of left submandibular lymph node, mediastinal lymph node, and probable lung involvement. This was followed by a biopsy on 06/13/2014 of a left supraclavicular mass that did reveal metastatic carcinoma consistent with breast primary. She was then treated with carboplatin and Taxol for 6 cycles with an excellent response (06/25/2014-10/09/2014). She was then transition back to hormone therapy consisting of Ibrance and Faslodex (11/13/2014-06/10/2015). This was complicated by grade 3 neutropenia with cycle #2 resulting in a dose reduction of Ibrance to 100 mg for cycle 3. Unfortunately, on 06/10/2015, she was noted to have progression of disease in the right mid cervical lymph node over a 4 day period that was PET avid. Therapy was therefore changed to exemestane and everolimus (29/52/8413-24/40/1027) that was complicated by an adverse reaction consisting of diffuse pneumonitis. This regimen was therefore discontinued. She then transitioned back to systemic chemotherapy consisting of cisplatin every 14 days (08/23/2015-12/23/2015). She was put on a drug holiday beginning on 12/23/2015 in preparation for a trip to [place] on 01/25/2016.  PET was reviewed in detail. In regards to her breast cancer, PET is essentially negative. Imaging is noted above. Nodal disease in the neck was not  detected. We reviewed her pathology, prognostic markers are reviewed. Disease is still HER 2 negative. Pathology is noted above.  At this point I am most concerned about her anal carcinoma based upon imaging and her increasing pain. She has an appointment with dr. Marcello Moores on Monday. I have spoken with Dr. Marcello Moores, she is in the hospital this week and cannot see the patient sooner. I have increased her pain medications. She is to notify me if her pain does not improve. She may at this point need an APR, she is aware of this.   In regards to her breast  cancer she has several options. Although her disease is not triple negative/BRCA positive, Olaparib is still an option given that she is HER 2 -/BRCA positive. She has not tried ixabepilone, erbulin. Her anal carcinoma may make her ineligible for clinical trial.   Bone metastasis ([place]) Bone metastases from breast cancer. On Xgeva therapy. She is also taking calcium and vitamin D.  Insomnia/Anxiety  She is not sleeping or eating well. We discussed switching from Lexapro to Remeron. She has tried coming off Lexapro before and was not successful secondary to side effect. She was given instructions on how to do so. Hopefully remeron will help her sleep and pick up her appetite.    She will return for follow up in about 1.5 weeks.  Meds ordered this encounter  Medications  . fentaNYL (DURAGESIC - DOSED MCG/HR) 50 MCG/HR    Sig: Place 1 patch (50 mcg total) onto the skin every 3 (three) days.    Dispense:  10 patch    Refill:  0  . fentaNYL (DURAGESIC - DOSED MCG/HR) 25 MCG/HR patch    Sig: Place 1 patch (25 mcg total) onto the skin every 3 (three) days.    Dispense:  10 patch    Refill:  0  . mirtazapine (REMERON) 30 MG tablet    Sig: Take 1 tablet (30 mg total) by mouth at bedtime. Take 15 mg for 3 days then increase to 30 mg    Dispense:  60 tablet    Refill:  2  . Olaparib 150 MG TABS    Sig: Take 300 mg by mouth 2 (two) times daily.     Dispense:  120 tablet    Refill:  2    All questions were answered. The patient knows to call the clinic with any problems, questions or concerns. We can certainly see the patient much sooner if necessary.  This document serves as a record of services personally performed by Ancil Linsey, MD. It was created on her behalf by Arlyce Harman, a trained medical scribe. The creation of this record is based on the scribe's personal observations and the provider's statements to them. This document has been checked and approved by the attending provider.  I have reviewed the above documentation for accuracy and completeness, and I agree with the above.  This note is electronically signed by: Molli Hazard, MD   08/11/2016 10:03 AM

## 2016-08-11 NOTE — Patient Instructions (Addendum)
[place] at [facility] Discharge Instructions  RECOMMENDATIONS MADE BY THE CONSULTANT AND ANY TEST RESULTS WILL BE SENT TO YOUR REFERRING PHYSICIAN.  You were seen today by Dr. Whitney Muse. Use Remeron and try halfing lexapro. Rx for Remeron sent to your pharmacy. Start taking Remeron 15 mg for 3 days then increase to 30 mg and stop your lexapro.  Talked to Dr. Marcello Moores, can't see you until Monday, will treat your pain until then.  Return to clinic in 2 weeks.   Thank you for choosing [place] at [facility] to provide your oncology and hematology care.  To afford each patient quality time with our provider, please arrive at least 15 minutes before your scheduled appointment time.    If you have a lab appointment with the [facility] please come in thru the  Main Entrance and check in at the main information desk  You need to re-schedule your appointment should you arrive 10 or more minutes late.  We strive to give you quality time with our providers, and arriving late affects you and other patients whose appointments are after yours.  Also, if you no show three or more times for appointments you may be dismissed from the clinic at the providers discretion.     Again, thank you for choosing [facility].  Our hope is that these requests will decrease the amount of time that you wait before being seen by our physicians.       _____________________________________________________________  Should you have questions after your visit to [facility], please contact our office at [phone] between the hours of 8:30 a.m. and 4:30 p.m.  Voicemails left after 4:30 p.m. will not be returned until the following business day.  For prescription refill requests, have your pharmacy contact our office.       Resources For Cancer Patients and their Caregivers ? American Cancer Society: Can assist with transportation, wigs,  general needs, runs Look Good Feel Better.        (437) 019-5043 ? Cancer Care: Provides financial assistance, online support groups, medication/co-pay assistance.  1-800-813-HOPE 709 472 1634) ? [place] Assists [place] cancer patients and their families through emotional , educational and financial support.  947-163-5574 ? [place] DSS Where to apply for food stamps, Medicaid and utility assistance. (873) 686-8117 ? RCATS: Transportation to medical appointments. 551-611-2399 ? Social Security Administration: May apply for disability if have a Stage IV cancer. (317) 735-0281 581-269-5500 ? [employer], Disability and Transit Services: Assists with nutrition, care and transit needs. [place] Support Programs: @10RELATIVEDAYS @ > Cancer Support Group  2nd Tuesday of the month 1pm-2pm, Journey Room  > Creative Journey  3rd Tuesday of the month 1130am-1pm, Journey Room  > Look Good Feel Better  1st Wednesday of the month 10am-12 noon, Journey Room (Call [place] to register [phone])

## 2016-08-11 NOTE — Progress Notes (Signed)
[name] tolerated Portacath flush with labs drawn well without complaints or incident. Port accessed with good blood return then flushed with 20 ml NS and 5 ml Heparin easily per protocol.Pt did not want to wait for labs today so she will return for Xgeva injection tomorrow. Pt discharged self ambulatory in satisfactory condition accompanied by her isister

## 2016-08-11 NOTE — Patient Instructions (Signed)
[place] at [facility] Discharge Instructions  RECOMMENDATIONS MADE BY THE CONSULTANT AND ANY TEST RESULTS WILL BE SENT TO YOUR REFERRING PHYSICIAN.  Portacath flushed per protocol after labs drawn. Pt to return tomorrow for Xgeva injection if labs stable. Follow-up as scheduled. Call clinic for any questions or concerns  Thank you for choosing [place] at [facility] to provide your oncology and hematology care.  To afford each patient quality time with our provider, please arrive at least 15 minutes before your scheduled appointment time.    If you have a lab appointment with the [facility] please come in thru the  Main Entrance and check in at the main information desk  You need to re-schedule your appointment should you arrive 10 or more minutes late.  We strive to give you quality time with our providers, and arriving late affects you and other patients whose appointments are after yours.  Also, if you no show three or more times for appointments you may be dismissed from the clinic at the providers discretion.     Again, thank you for choosing [facility].  Our hope is that these requests will decrease the amount of time that you wait before being seen by our physicians.       _____________________________________________________________  Should you have questions after your visit to [facility], please contact our office at [phone] between the hours of 8:30 a.m. and 4:30 p.m.  Voicemails left after 4:30 p.m. will not be returned until the following business day.  For prescription refill requests, have your pharmacy contact our office.       Resources For Cancer Patients and their Caregivers ? American Cancer Society: Can assist with transportation, wigs, general needs, runs Look Good Feel Better.        ([phone] [phone] ? Cancer Care: Provides financial assistance, online support groups,  medication/co-pay assistance.  1-800-813-HOPE 817 379 7139) ? [place] Assists [place] cancer patients and their families through emotional , educational and financial support.  (437)256-9097 ? [place] DSS Where to apply for food stamps, Medicaid and utility assistance. (614)008-3589 ? RCATS: Transportation to medical appointments. (539) 510-1451 ? Social Security Administration: May apply for disability if have a Stage IV cancer. 806-880-9040 (347) 126-1873 ? [employer], Disability and Transit Services: Assists with nutrition, care and transit needs. [place] Support Programs: @10RELATIVEDAYS @ > Cancer Support Group  2nd Tuesday of the month 1pm-2pm, Journey Room  > Creative Journey  3rd Tuesday of the month 1130am-1pm, Journey Room  > Look Good Feel Better  1st Wednesday of the month 10am-12 noon, Journey Room (Call [place] to register [phone])

## 2016-08-12 ENCOUNTER — Encounter (HOSPITAL_BASED_OUTPATIENT_CLINIC_OR_DEPARTMENT_OTHER): Payer: BLUE CROSS/BLUE SHIELD

## 2016-08-12 ENCOUNTER — Encounter (HOSPITAL_COMMUNITY): Payer: Self-pay

## 2016-08-12 VITALS — BP 137/64 | HR 73 | Temp 98.6°F | Resp 18

## 2016-08-12 DIAGNOSIS — C7951 Secondary malignant neoplasm of bone: Secondary | ICD-10-CM

## 2016-08-12 DIAGNOSIS — C50912 Malignant neoplasm of unspecified site of left female breast: Secondary | ICD-10-CM | POA: Diagnosis not present

## 2016-08-12 LAB — CANCER ANTIGEN 27.29: CA 27.29: 33.2 U/mL (ref 0.0–38.6)

## 2016-08-12 LAB — CANCER ANTIGEN 15-3: CA 15-3: 26.4 U/mL — ABNORMAL HIGH (ref 0.0–25.0)

## 2016-08-12 MED ORDER — DENOSUMAB 120 MG/1.7ML ~~LOC~~ SOLN
120.0000 mg | Freq: Once | SUBCUTANEOUS | Status: AC
  Administered 2016-08-12: 120 mg via SUBCUTANEOUS
  Filled 2016-08-12: qty 1.7

## 2016-08-12 NOTE — Progress Notes (Signed)
[name] presents today for injection per MD orders. Xgeva administered SQ in right Abdomen. Administration without incident. Patient tolerated well.

## 2016-08-12 NOTE — Patient Instructions (Signed)
[place] at [facility] Discharge Instructions  RECOMMENDATIONS MADE BY THE CONSULTANT AND ANY TEST RESULTS WILL BE SENT TO YOUR REFERRING PHYSICIAN.  Xgeva today.    Thank you for choosing [place] at [facility] to provide your oncology and hematology care.  To afford each patient quality time with our provider, please arrive at least 15 minutes before your scheduled appointment time.    If you have a lab appointment with the [facility] please come in thru the  Main Entrance and check in at the main information desk  You need to re-schedule your appointment should you arrive 10 or more minutes late.  We strive to give you quality time with our providers, and arriving late affects you and other patients whose appointments are after yours.  Also, if you no show three or more times for appointments you may be dismissed from the clinic at the providers discretion.     Again, thank you for choosing [facility].  Our hope is that these requests will decrease the amount of time that you wait before being seen by our physicians.       _____________________________________________________________  Should you have questions after your visit to [facility], please contact our office at [phone] between the hours of 8:30 a.m. and 4:30 p.m.  Voicemails left after 4:30 p.m. will not be returned until the following business day.  For prescription refill requests, have your pharmacy contact our office.       Resources For Cancer Patients and their Caregivers ? American Cancer Society: Can assist with transportation, wigs, general needs, runs Look Good Feel Better.        234-190-3873 ? Cancer Care: Provides financial assistance, online support groups, medication/co-pay assistance.  1-800-813-HOPE (272) 735-5749) ? [place] Assists [place] cancer patients and their families through emotional ,  educational and financial support.  [phone] ? [place] DSS Where to apply for food stamps, Medicaid and utility assistance. 929 368 7081 ? RCATS: Transportation to medical appointments. 7161878840 ? Social Security Administration: May apply for disability if have a Stage IV cancer. ([phone] 449-2291 607-700-6777 ? [employer], Disability and Transit Services: Assists with nutrition, care and transit needs. [place] Support Programs: @10RELATIVEDAYS @ > Cancer Support Group  2nd Tuesday of the month 1pm-2pm, Journey Room  > Creative Journey  3rd Tuesday of the month 1130am-1pm, Journey Room  > Look Good Feel Better  1st Wednesday of the month 10am-12 noon, Journey Room (Call [place] to register [phone])

## 2016-08-14 ENCOUNTER — Other Ambulatory Visit (HOSPITAL_COMMUNITY): Payer: Self-pay | Admitting: Oncology

## 2016-08-17 ENCOUNTER — Other Ambulatory Visit: Payer: Self-pay | Admitting: General Surgery

## 2016-08-17 NOTE — H&P (Signed)
KRITI FULL 08/17/2016 1:36 PM Location: [facility] Patient #: [id number] DOB: 24-Mar-1951 Divorced / Language: Cleophus Molt / Race: White Female  History of Present Illness Leighton Ruff MD; Q000111Q 2:08 PM) The patient is a 66 year old female who presents with anal pain. 66 year old female patient of Dr. Donne Hazel, who has treated her in the past for a left-sided breast cancer. Patient has stage IV disease with metastases noted to her lung, cervical lymph nodes and axillary lymph nodes. During a f/u PET scan, a lesion was noted in her colon. She has also noticed some rectal bleeding and rectal pain. She underwent a colonoscopy, which showed a distal rectal mass. 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 CT scan showed no signs of metastatic disease. She underwent chemotherapy and radiation treatment. This was completed at the end of August 2017. I saw her back on October 31 and she had some edema in the area and signs of regression. Since that time she has noticed worsening pain and bleeding. She is now taking 70mcg fentanyl and a daily basis.   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 Spinal Surgery - Neck Cataract Surgery Right. Breast Biopsy Left.  Diagnostic Studies History Leighton Ruff, MD; Q000111Q 2:04 PM) Mammogram 1-3 years ago Colonoscopy never Pap Smear 1-5 years ago  Allergies Malachy Moan, RMA; 08/17/2016 1:36 PM) Ciprofloxacin *CHEMICALS*  Medication History Malachy Moan, RMA; 08/17/2016 1:36 PM) Illusions AA Breast Prosthesis (1 (one) Misc as needed, Taken starting 07/24/2014) Active. (C50.919 Left Mastectomy ; ; [id number]  Post-Mastectomy Camisole-2; ; L8000-Post-Surgical Bras-6; ; [id number]- Non-Silicone Breast Prosthesis-2; ; [id number]- Silicone Breast Prosthesis-1) Metoprolol Succinate ER (25MG  Tablet ER 24HR, Oral) Active. Escitalopram Oxalate (10MG  Tablet, Oral) Active. Mirtazapine (30MG  Tablet, Oral) Active. FentaNYL (12MCG/HR Patch 72HR, Transdermal) Active. Oscal 500/200 D-3 (500-200MG -UNIT Tablet, Oral) Active. Sominex (25MG  Tablet, Oral) Active. Zofran (4MG  Tablet, Oral as needed) Active. Nitrofurantoin Macrocrystal (100MG  Capsule, Oral) Active. MAGnesium-Oxide (400 (241.3 Mg)MG Tablet, Oral) Active. Omeprazole (20MG  Capsule DR, Oral) Active. Acetyl L-Carnitine (500MG  Capsule, Oral) Active. Medications Reconciled  Social History Leighton Ruff, MD; Q000111Q 2:04 PM) Alcohol use Moderate alcohol use. No drug use Caffeine use Tea. Tobacco use Never smoker.  Family History Leighton Ruff, MD; Q000111Q 2:04 PM) Arthritis Mother. Heart Disease Father. Depression Mother. Migraine Headache Mother. Hypertension Mother.  Pregnancy / Birth History Leighton Ruff, MD; Q000111Q 2:04 PM) Durenda Age 2 Age at menarche 72 years. Contraceptive History Oral contraceptives. Age of menopause 1-55  Other Problems Leighton Ruff, MD; Q000111Q 2:08 PM) Migraine Headache Lump In Breast Heart murmur Gastroesophageal Reflux Disease High blood pressure Hemorrhoids Bladder Problems Back Pain Depression Breast Cancer Anxiety Disorder     Review of Systems Leighton Ruff MD; Q000111Q 2:04 PM) General Present- Fatigue. Not Present- Appetite Loss, Chills, Fever, Night Sweats, Weight Gain and Weight Loss. Skin Not Present- Change in Wart/Mole, Dryness, Hives, Jaundice, New Lesions, Non-Healing Wounds, Rash and Ulcer. HEENT Present- Seasonal Allergies and Wears glasses/contact lenses. Not Present- Earache, Hearing Loss, Hoarseness, Nose Bleed, Oral Ulcers, Ringing in the Ears,  Sinus Pain, Sore Throat, Visual Disturbances and Yellow Eyes. Respiratory Present- Chronic Cough, Difficulty Breathing, Snoring and Wheezing. Not Present- Bloody sputum. Breast Not Present- Breast Mass, Breast Pain, Nipple Discharge and Skin Changes. Cardiovascular Present- Leg  Cramps and Shortness of Breath. Not Present- Chest Pain, Difficulty Breathing Lying Down, Palpitations, Rapid Heart Rate and Swelling of Extremities. Gastrointestinal Present- Hemorrhoids and Indigestion. Not Present- Abdominal Pain, Bloating, Bloody Stool, Change in Bowel Habits, Chronic diarrhea, Constipation, Difficulty Swallowing, Excessive gas, Gets full quickly at meals, Nausea, Rectal Pain and Vomiting. Female Genitourinary Not Present- Frequency, Nocturia, Painful Urination, Pelvic Pain and Urgency. Musculoskeletal Present- Muscle Weakness. Not Present- Back Pain, Joint Pain, Joint Stiffness, Muscle Pain and Swelling of Extremities. Neurological Present- Tremor. Not Present- Decreased Memory, Fainting, Headaches, Numbness, Seizures, Tingling, Trouble walking and Weakness. Psychiatric Present- Anxiety. Not Present- Bipolar, Change in Sleep Pattern, Depression, Fearful and Frequent crying. Endocrine Present- Heat Intolerance. Not Present- Cold Intolerance, Excessive Hunger, Hair Changes, Hot flashes and New Diabetes. Hematology Not Present- Easy Bruising, Excessive bleeding, Gland problems, HIV and Persistent Infections.  Vitals Malachy Moan RMA; 08/17/2016 1:37 PM) 08/17/2016 1:37 PM Weight: 138.6 lb Height: 64in Body Surface Area: 1.67 m Body Mass Index: 23.79 kg/m  Temp.: 23F  Pulse: 67 (Regular)  BP: 120/70 (Sitting, Left Arm, Standard)      Physical Exam Leighton Ruff MD; Q000111Q 2:04 PM)  General Mental Status-Alert. General Appearance-Not in acute distress. Build & Nutrition-Well nourished. Posture-Normal posture. Gait-Normal.  Head and Neck Head-normocephalic,  atraumatic with no lesions or palpable masses. Trachea-midline.  Chest and Lung Exam Chest and lung exam reveals -on auscultation, normal breath sounds, no adventitious sounds and normal vocal resonance.  Cardiovascular Cardiovascular examination reveals -normal heart sounds, regular rate and rhythm with no murmurs.  Abdomen Inspection Inspection of the abdomen reveals - No Hernias. Palpation/Percussion Palpation and Percussion of the abdomen reveal - Soft, Non Tender, No Rigidity (guarding), No hepatosplenomegaly and No Palpable abdominal masses.  Rectal Anorectal Exam External - normal external exam. Internal - Note: Approximately 4-6 cm palpable ulcerative mass at posterior midline of the anal canal arising around the level of the dentate line and extending into distal rectum.  Neurologic Neurologic evaluation reveals -alert and oriented x 3 with no impairment of recent or remote memory, normal attention span and ability to concentrate, normal sensation and normal coordination.  Musculoskeletal Normal Exam - Bilateral-Upper Extremity Strength Normal and Lower Extremity Strength Normal.    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 She states that she is having progressively worsening pain and bleeding. On exam, she has an ulcerative mass consistent with recurrence at posterior midline overlying the coccyx. This is confirmed with metabolic activity noted on recent PET scan. I discussed abdominal perineal resection with the patient. We will plan on doing this open to expedite scheduling. The surgery and anatomy were described to the patient as well as the risks of surgery and the possible complications. These include: Bleeding, deep abdominal infections and possible wound complications such as hernia  and infection, damage to adjacent structures, leak of surgical connections, which can lead to other surgeries and possibly an ostomy, possible need for other procedures, such as abscess drains in radiology, possible prolonged hospital stay, possible diarrhea from removal of part of the colon, possible constipation from narcotics, possible bowel, bladder or sexual dysfunction if having rectal surgery, prolonged fatigue/weakness or appetite loss, possible early recurrence of of disease, possible complications of their medical problems such as heart disease or arrhythmias or lung problems, death (less than 1%). I believe  the patient understands and wishes to proceed with the surgery.

## 2016-08-18 ENCOUNTER — Telehealth (HOSPITAL_COMMUNITY): Payer: Self-pay | Admitting: Hematology & Oncology

## 2016-08-18 NOTE — Telephone Encounter (Signed)
OLAPARIB/LYNPARZA APPROVED. PENDING COPY INFO

## 2016-08-20 ENCOUNTER — Telehealth (HOSPITAL_COMMUNITY): Payer: Self-pay | Admitting: Oncology

## 2016-08-20 NOTE — Telephone Encounter (Signed)
Faxed olaparzib script to [facility] on 1/3,pre auth on completed on 1/5. authd on 1/9 and recvd a vm from [facility] stating they can not fill script forward it to [facility]. Will fax script and auth letter to [facility] and mark it as urgent.

## 2016-08-21 ENCOUNTER — Telehealth (HOSPITAL_COMMUNITY): Payer: Self-pay | Admitting: Emergency Medicine

## 2016-08-21 NOTE — Telephone Encounter (Signed)
Called and completed teaching over the phone on lynparza.    Chemotherapy teaching pulled together for when pt comes in for appt on 08/26/2015. Chemo consent needs to be signed.

## 2016-08-21 NOTE — Patient Instructions (Signed)
[place]   CHEMOTHERAPY INSTRUCTIONS  You have breast cancer.  You are going to take Lynparza 300 mg twice a day, daily.  This treatment is with palliative intent, which means you are treatable but not curable.  You will take this drug until you progress or you can no longer tolerate it.   You will see the doctor regularly throughout treatment.  We monitor your lab work through treatment.  The doctor monitors your response to treatment by the way you are feeling, your blood work, and scans periodically.  POTENTIAL SIDE EFFECTS OF TREATMENT:  How should I take [name]?  Take LYNPARZA tablets exactly as your health care provider tells you   Your health care provider may temporarily stop treatment with [facility] or change your dose of LYNPARZA if you experience side effects   LYNPARZA comes as tablets and capsules. LYNPARZA tablets and capsules are not the same. If your health care provider prescribes LYNPARZA tablets for you, do not take LYNPARZA capsules. Do not take more than 4 LYNPARZA tablets in 1 day. If you have any questions about LYNPARZA, talk to your health care provider or pharmacist   Take [facility] by mouth 2 times a day   Each dose should be taken about 12 hours apart   Swallow LYNPARZA tablets whole. Do not chew, crush, dissolve, or divide the tablets   Take LYNPARZA with or without food   If you miss a dose of LYNPARZA, take your next dose at your usual scheduled time. Do not take an extra dose to make up for a missed dose   If you take too much [facility], call your health care provider or go to the nearest hospital emergency room right away What should I avoid while taking LYNPARZA?  Avoid grapefruit, grapefruit juice, [place] oranges, and [place] orange juice during treatment with LYNPARZA since they may increase the level of LYNPARZA in your blood    LYNPARZA may cause serious side effects. The most common side effects of  LYNPARZA are:  Nausea or vomiting. Tell your health care provider if you get nausea or vomiting. Your health care provider may prescribe medicines to treat these symptoms   Tiredness or weakness   Diarrhea   Headache   Changes in kidney function blood test   Low number of platelets   Changes in the way food tastes   Loss of appetite   Low number of red or white blood cells   Mouth sores   Respiratory infections   Indigestion or heartburn   Sore throat or runny nose   Upper respiratory infection   Cough   Pain in the joints, muscles, and back   Rash   Pain or discomfort in the stomach area  IMPORTANT SAFETY INFORMATION LYNPARZA may cause serious side effects, including: Bone marrow problems called Myelodysplastic Syndrome (MDS) or Acute Myeloid Leukemia (AML). Some people who have ovarian cancer and who have received previous treatment with chemotherapy, radiotherapy, or certain other medicines for their cancer have developed MDS or AML during treatment with LYNPARZA. MDS or AML may lead to death. If you develop MDS or AML, your health care provider will stop treatment with LYNPARZA. Symptoms of low blood cell counts are common during treatment with LYNPARZA, but can be a sign of serious bone marrow problems, including MDS or AML. Symptoms may include: weakness, weight loss, fever, frequent infections, blood in urine or stool, shortness of breath, feeling very tired, bruising or bleeding more  easily. Your health care provider will do blood tests to check your blood cell counts:  Before treatment with LYNPARZA   Every month during treatment with [facility]   Weekly if you have low blood cell counts that last a long time. Your health care provider may stop treatment with LYNPARZA until your blood cell counts improve Lung problems (pneumonitis). Tell your health care provider if you have any new or worsening symptoms of lung problems, including shortness of breath, fever,  cough, or wheezing. Your health care provider may do a chest x-ray if you have any of these symptoms. Your health care provider may temporarily stop treatment or completely stop treatment if you develop pneumonitis. Pneumonitis may lead to death. Before you take [facility], tell your health care provider about all your medical conditions, including if you:  Have lung or breathing problems   Have liver problems   Have kidney problems   Are pregnant or plan to become pregnant. LYNPARZA can harm your unborn baby and may cause loss of pregnancy (miscarriage)   If you are able to become pregnant, your health care provider may do a pregnancy test before you start treatment with [facility]   Females who are able to become pregnant should use effective birth control (contraception) during treatment with [facility] and for 6 months after receiving the last dose of LYNPARZA   Talk to your health care provider about birth control methods that may be right for you   Tell your health care provider right away if you become pregnant  Are breastfeeding or plan to breastfeed. It is not known if LYNPARZA passes into your breast milk. Do not breastfeed during treatment with LYNPARZA and for 1 month after receiving the last dose of LYNPARZA. Talk to your health care provider about the best way to feed your baby during this time     [place]:  Hydration Increase your fluid intake 48 hours prior to treatment and drink at least 8 to 12 cups (64 ounces) of water/decaff beverages per day after treatment. You can still have your cup of coffee or soda but these beverages do not count as part of your 8 to 12 cups that you need to drink daily. No alcohol intake.  Medications Continue taking your normal prescription medication as prescribed.  If you start any new herbal or new supplements please let us know first to make sure it is safe.  Mouth Care Have teeth cleaned professionally  before starting treatment. Keep dentures and partial plates clean. Use soft toothbrush and do not use mouthwashes that contain alcohol. Biotene is a good mouthwash that is available at most pharmacies or may be ordered by calling [phone]. Use warm salt water gargles (1 teaspoon salt per 1 quart warm water) before and after meals and at bedtime. Or you may rinse with 2 tablespoons of three-percent hydrogen peroxide mixed in eight ounces of water. If you are still having problems with your mouth or sores in your mouth please call the clinic. If you need dental work, please let Dr. Whitney Muse know before you go for your appointment so that we can coordinate the best possible time for you in regards to your chemo regimen. You need to also let your dentist know that you are actively taking chemo. We may need to do labs prior to your dental appointment.   Skin Care Always use sunscreen that has not expired and with SPF (Sun Protection Factor) of 50 or higher. Wear  hats to protect your head from the sun. Remember to use sunscreen on your hands, ears, face, & feet.  Use good moisturizing lotions such as udder cream, eucerin, or even Vaseline. Some chemotherapies can cause dry skin, color changes in your skin and nails.    . Avoid long, hot showers or baths. . Use gentle, fragrance-free soaps and laundry detergent. . Use moisturizers, preferably creams or ointments rather than lotions because the thicker consistency is better at preventing skin dehydration. Apply the cream or ointment within 15 minutes of showering. Reapply moisturizer at night, and moisturize your hands every time after you wash them.  Hair Loss (if your doctor says your hair will fall out)  . If your doctor says that your hair is likely to fall out, decide before you begin chemo whether you want to wear a wig. You may want to shop before treatment to match your hair color. . Hats, turbans, and scarves can also camouflage hair loss,  although some people prefer to leave their heads uncovered. If you go bare-headed outdoors, be sure to use sunscreen on your scalp. . Cut your hair short. It eases the inconvenience of shedding lots of hair, but it also can reduce the emotional impact of watching your hair fall out. . Don't perm or color your hair during chemotherapy. Those chemical treatments are already damaging to hair and can enhance hair loss. Once your chemo treatments are done and your hair has grown back, it's OK to resume dyeing or perming hair. With chemotherapy, hair loss is almost always temporary. But when it grows back, it may be a different color or texture. In older adults who still had hair color before chemotherapy, the new growth may be completely gray.  Often, new hair is very fine and soft.  Infection Prevention Please wash your hands for at least 30 seconds using warm soapy water. Handwashing is the #1 way to prevent the spread of germs. Stay away from sick people or people who are getting over a cold. If you develop respiratory systems such as green/yellow mucus production or productive cough or persistent cough let us know and we will see if you need an antibiotic. It is a good idea to keep a pair of gloves on when going into grocery stores/[employer] to decrease your risk of coming into contact with germs on the carts, etc. Carry alcohol hand gel with you at all times and use it frequently if out in public. If your temperature reaches 100.5 or higher please call the clinic and let us know.  If it is after hours or on the weekend please go to the ER if your temperature is over 100.5.  Please have your own personal thermometer at home to use.    Sex and bodily fluids If you are going to have sex, a condom must be used to protect the person that isn't taking chemotherapy. Chemo can decrease your libido (sex drive). For a few days after chemotherapy, chemotherapy can be excreted through your bodily fluids.  When using the  toilet please close the lid and flush the toilet twice.  Do this for a few day after you have had chemotherapy.     Effects of chemotherapy on your sex life Some changes are simple and won't last long. They won't affect your sex life permanently. Sometimes you may feel: . too tired . not strong enough to be very active . sick or sore  . not in the mood . anxious or low  Your anxiety might not seem related to sex. For example, you may be worried about the cancer and how your treatment is going. Or you may be worried about money, or about how you family are coping with your illness. These things can cause stress, which can affect your interest in sex. It's important to talk to your partner about how you feel. Remember - the changes to your sex life don't usually last long. There's usually no medical reason to stop having sex during chemo. The drugs won't have any long term physical effects on your performance or enjoyment of sex. Cancer can't be passed on to your partner during sex  Contraception It's important to use reliable contraception during treatment. Avoid getting pregnant while you or your partner are having chemotherapy. This is because the drugs may harm the baby. Sometimes chemotherapy drugs can leave a man or woman infertile.  This means you would not be able to have children in the future. You might want to talk to someone about permanent infertility. It can be very difficult to learn that you may no longer be able to have children. Some people find counselling helpful. There might be ways to preserve your fertility, although this is easier for men than for women. You may want to speak to a fertility expert. You can talk about sperm banking or harvesting your eggs. You can also ask about other fertility options, such as donor eggs. If you have or have had breast cancer, your doctor might advise you not to take the contraceptive pill. This is because the hormones in it might affect the  cancer.  It is not known for sure whether or not chemotherapy drugs can be passed on through semen or secretions from the vagina. Because of this some doctors advise people to use a barrier method if you have sex during treatment. This applies to vaginal, anal or oral sex. Generally, doctors advise a barrier method only for the time you are actually having the treatment and for about a week after your treatment. Advice like this can be worrying, but this does not mean that you have to avoid being intimate with your partner. You can still have close contact with your partner and continue to enjoy sex.  Animals If you have cats or birds we just ask that you not change the litter or change the cage.  Please have someone else do this for you while you are on chemotherapy.   Food Safety During and After Cancer Treatment Food safety is important for people both during and after cancer treatment. Cancer and cancer treatments, such as chemotherapy, radiation therapy, and stem cell/bone marrow transplantation, often weaken the immune system. This makes it harder for your body to protect itself from foodborne illness, also called food poisoning. Foodborne illness is caused by eating food that contains harmful bacteria, parasites, or viruses.  Foods to avoid Some foods have a higher risk of becoming tainted with bacteria. These include: Marland Kitchen Unwashed fresh fruit and vegetables, especially leafy vegetables that can hide dirt and other contaminants . Raw sprouts, such as alfalfa sprouts . Raw or undercooked beef, especially ground beef, or other raw or undercooked meat and poultry . Fatty, fried, or spicy foods immediately before or after treatment.  These can sit heavy on your stomach and make you feel nauseous. . Raw or undercooked shellfish, such as oysters. . Sushi and sashimi, which often contain raw fish.  . Unpasteurized beverages, such as unpasteurized fruit juices, raw milk, raw  yogurt, or  cider . Undercooked eggs, such as soft boiled, over easy, and poached; raw, unpasteurized eggs; or foods made with raw egg, such as homemade raw cookie dough and homemade mayonnaise Simple steps for food safety Shop smart. . Do not buy food stored or displayed in an unclean area. . Do not buy bruised or damaged fruits or vegetables. . Do not buy cans that have cracks, dents, or bulges. . Pick up foods that can spoil at the end of your shopping trip and store them in a cooler on the way home. Prepare and clean up foods carefully. . Rinse all fresh fruits and vegetables under running water, and dry them with a clean towel or paper towel. . Clean the top of cans before opening them. . After preparing food, wash your hands for 20 seconds with hot water and soap. Pay special attention to areas between fingers and under nails. . Clean your utensils and dishes with hot water and soap. Marland Kitchen Disinfect your kitchen and cutting boards using 1 teaspoon of liquid, unscented bleach mixed into 1 quart of water.   Dispose of old food. . Eat canned and packaged food before its expiration date (the "use by" or "best before" date). . Consume refrigerated leftovers within 3 to 4 days. After that time, throw out the food. Even if the food does not smell or look spoiled, it still may be unsafe. Some bacteria, such as Listeria, can grow even on foods stored in the refrigerator if they are kept for too long. Take precautions when eating out. . At restaurants, avoid buffets and salad bars where food sits out for a long time and comes in contact with many people. Food can become contaminated when someone with a virus, often a norovirus, or another "bug" handles it. . Put any leftover food in a "to-go" container yourself, rather than having the server do it. And, refrigerate leftovers as soon as you get home. . Choose restaurants that are clean and that are willing to prepare your food as you order it  cooked.   MEDICATIONS:                                                                                                                                                      Zofran/Ondansetron 8mg  tablet. Take 1 tablet every 8 hours as needed for nausea/vomiting. (#1 nausea med to take, this can constipate)  Compazine/Prochlorperazine 10mg  tablet. Take 1 tablet every 6 hours as needed for nausea/vomiting. (#2 nausea med to take, this can make you sleepy)   Over-the-Counter Meds:  Miralax 1 capful in 8 oz of fluid daily. May increase to two times a day if needed. This is a stool softener. If this doesn't work proceed you can add:  Senokot S-start with 1 tablet two times a day and increase to  4 tablets two times a day if needed. (total of 8 tablets in a 24 hour period). This is a stimulant laxative.   Call us if this does not help your bowels move.   Imodium 2mg  capsule. Take 2 capsules after the 1st loose stool and then 1 capsule every 2 hours until you go a total of 12 hours without having a loose stool. Call the [place] if loose stools continue. If diarrhea occurs @ bedtime, take 2 capsules @ bedtime. Then take 2 capsules every 4 hours until morning. Call [place].    Constipation Sheet *Miralax in 8 oz of fluid daily.  May increase to two times a day if needed.  This is a stool softener.  If this not enough to keep your bowel regular:  You can add:  *Senokot S, start with one tablet twice a day and can increase to 4 tablets twice a day if needed.  This is a stimulant laxative.   Sometimes when you take pain medication you need BOTH a medicine to keep your stool soft and a medicine to help your bowel push it out!  Please call if the above does not work for you.   Do not go more than 2 days without a bowel movement.  It is very important that you do not become constipated.  It will make you feel sick to your stomach (nausea) and can cause abdominal pain and  vomiting.    Diarrhea Sheet  If you are having loose stools/diarrhea, please purchase Imodium and begin taking as outlined:  At the first sign of poorly formed or loose stools you should begin taking Imodium(loperamide) 2 mg capsules.  Take two caplets (4mg ) followed by one caplet (2mg ) every 2 hours until you have had no diarrhea for 12 hours.  During the night take two caplets (4mg ) at bedtime and continue every 4 hours during the night until the morning.  Stop taking Imodium only after there is no sign of diarrhea for 12 hours.    Always call the [place] if you are having loose stools/diarrhea that you can't get under control.  Loose stools/disrrhea leads to dehydration (loss of water) in your body.  We have other options of trying to get the loose stools/diarrhea to stopped but you must let us know!    Nausea Sheet  Zofran/Ondansetron 8mg  tablet. Take 1 tablet every 8 hours as needed for nausea/vomiting. (#1 nausea med to take, this can constipate)  Compazine/Prochlorperazine 10mg  tablet. Take 1 tablet every 6 hours as needed for nausea/vomiting. (#2 nausea med to take, this can make you sleepy)  You can take these medications together or separately.  We would first like for you to try the Ondansetron by itself and then take the Prochloperizine if needed. But you are allowed to take both medications at the same time if your nausea is that severe.  If you are having persistent nausea (nausea that does not stop) please take these medications on a staggered schedule so that the nausea medication stays in your body.  Please call the [place] and let us know the amount of nausea that you are experiencing.  If you begin to vomit, you need to call the [place] and if it is the weekend and you have vomited more than one time and cant get it to stop-go to the Emergency Room.  Persistent nausea/vomiting can lead to dehydration (loss of fluid in your body) and will make you feel terrible.  Ice chips, sips of clear liquids, foods that are @ room temperature, crackers, and toast tend to be better tolerated.    SYMPTOMS TO REPORT AS SOON AS POSSIBLE AFTER TREATMENT:  FEVER GREATER THAN 100.5 F  CHILLS WITH OR WITHOUT FEVER  NAUSEA AND VOMITING THAT IS NOT CONTROLLED WITH YOUR NAUSEA MEDICATION  UNUSUAL SHORTNESS OF BREATH  UNUSUAL BRUISING OR BLEEDING  TENDERNESS IN MOUTH AND THROAT WITH OR WITHOUT PRESENCE OF ULCERS  URINARY PROBLEMS  BOWEL PROBLEMS  UNUSUAL RASH    Wear comfortable clothing and clothing appropriate for easy access to any Portacath or PICC line. Let us know if there is anything that we can do to make your therapy better!    What to do if you need assistance after hours or on the weekends: CALL [phone].  HOLD on the line, do not hang up.  You will hear multiple messages but at the end you will be connected with a nurse triage line.  They will contact Dr Whitney Muse if necessary.  Most of the time they will be able to assist you.   Do not call the hospital operator.  Dr Whitney Muse will not answer phone calls received by them.     I have been informed and understand all of the instructions given to me and have received a copy. I have been instructed to call the clinic [phone] or my family physician as soon as possible for continued medical care, if indicated. I do not have any more questions at this time but understand that I may call the [place] or the Patient Navigator at [phone] during office hours should I have questions or need assistance in obtaining follow-up care.

## 2016-08-25 ENCOUNTER — Encounter (HOSPITAL_BASED_OUTPATIENT_CLINIC_OR_DEPARTMENT_OTHER): Payer: BLUE CROSS/BLUE SHIELD | Admitting: Hematology & Oncology

## 2016-08-25 ENCOUNTER — Encounter (HOSPITAL_COMMUNITY): Payer: Self-pay | Admitting: Hematology & Oncology

## 2016-08-25 ENCOUNTER — Encounter (HOSPITAL_COMMUNITY): Payer: BLUE CROSS/BLUE SHIELD

## 2016-08-25 ENCOUNTER — Encounter (HOSPITAL_COMMUNITY): Payer: BLUE CROSS/BLUE SHIELD | Attending: Hematology & Oncology

## 2016-08-25 VITALS — BP 128/59 | HR 83 | Temp 98.8°F | Resp 16 | Wt 135.9 lb

## 2016-08-25 DIAGNOSIS — Z170 Estrogen receptor positive status [ER+]: Secondary | ICD-10-CM | POA: Diagnosis not present

## 2016-08-25 DIAGNOSIS — C50912 Malignant neoplasm of unspecified site of left female breast: Secondary | ICD-10-CM | POA: Diagnosis not present

## 2016-08-25 DIAGNOSIS — C50919 Malignant neoplasm of unspecified site of unspecified female breast: Secondary | ICD-10-CM

## 2016-08-25 DIAGNOSIS — G893 Neoplasm related pain (acute) (chronic): Secondary | ICD-10-CM

## 2016-08-25 DIAGNOSIS — C7951 Secondary malignant neoplasm of bone: Secondary | ICD-10-CM

## 2016-08-25 DIAGNOSIS — C210 Malignant neoplasm of anus, unspecified: Secondary | ICD-10-CM | POA: Diagnosis not present

## 2016-08-25 DIAGNOSIS — Z1501 Genetic susceptibility to malignant neoplasm of breast: Secondary | ICD-10-CM

## 2016-08-25 DIAGNOSIS — Z1509 Genetic susceptibility to other malignant neoplasm: Secondary | ICD-10-CM

## 2016-08-25 LAB — COMPREHENSIVE METABOLIC PANEL
ALT: 16 U/L (ref 14–54)
AST: 24 U/L (ref 15–41)
Albumin: 3.9 g/dL (ref 3.5–5.0)
Alkaline Phosphatase: 64 U/L (ref 38–126)
Anion gap: 7 (ref 5–15)
BUN: 13 mg/dL (ref 6–20)
CO2: 28 mmol/L (ref 22–32)
Calcium: 9.5 mg/dL (ref 8.9–10.3)
Chloride: 101 mmol/L (ref 101–111)
Creatinine, Ser: 0.98 mg/dL (ref 0.44–1.00)
GFR calc Af Amer: >60 mL/min (ref >60)
GFR calc non Af Amer: >60 mL/min (ref >60)
Glucose, Bld: 77 mg/dL (ref 65–99)
Potassium: 4 mmol/L (ref 3.5–5.1)
Sodium: 136 mmol/L (ref 135–145)
Total Bilirubin: 0.3 mg/dL (ref 0.3–1.2)
Total Protein: 7 g/dL (ref 6.5–8.1)

## 2016-08-25 LAB — CBC WITH DIFFERENTIAL/PLATELET
Basophils Absolute: 0 10*3/uL (ref 0.0–0.1)
Basophils Relative: 0 %
Eosinophils Absolute: 0.1 10*3/uL (ref 0.0–0.7)
Eosinophils Relative: 1 %
HCT: 35.7 % — ABNORMAL LOW (ref 36.0–46.0)
Hemoglobin: 12 g/dL (ref 12.0–15.0)
Lymphocytes Relative: 13 %
Lymphs Abs: 1.1 10*3/uL (ref 0.7–4.0)
MCH: 37.2 pg — ABNORMAL HIGH (ref 26.0–34.0)
MCHC: 33.6 g/dL (ref 30.0–36.0)
MCV: 110.5 fL — ABNORMAL HIGH (ref 78.0–100.0)
Monocytes Absolute: 0.8 10*3/uL (ref 0.1–1.0)
Monocytes Relative: 9 %
Neutro Abs: 6.6 10*3/uL (ref 1.7–7.7)
Neutrophils Relative %: 77 %
Platelets: 261 10*3/uL (ref 150–400)
RBC: 3.23 MIL/uL — ABNORMAL LOW (ref 3.87–5.11)
RDW: 13.4 % (ref 11.5–15.5)
WBC: 8.6 10*3/uL (ref 4.0–10.5)

## 2016-08-25 LAB — VITAMIN B12: Vitamin B-12: 882 pg/mL (ref 180–914)

## 2016-08-25 LAB — FOLATE: Folate: 33.9 ng/mL (ref >5.9)

## 2016-08-25 LAB — FERRITIN: Ferritin: 72 ng/mL (ref 11–307)

## 2016-08-25 MED ORDER — FENTANYL 50 MCG/HR TD PT72: MEDICATED_PATCH | TRANSDERMAL | 0 refills | Status: DC

## 2016-08-25 MED ORDER — FENTANYL 25 MCG/HR TD PT72: MEDICATED_PATCH | TRANSDERMAL | 0 refills | Status: DC

## 2016-08-25 NOTE — Progress Notes (Signed)
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      04/09/2008 Procedure    Biopsy of left axillary node mass       04/16/2008 - 08/13/2008 Chemotherapy    Dose dense epirubicin/Cytoxan 4 cycles followed by docetaxel 4 cycles. Herceptin initiated with docetaxel for a total of 52 weeks.      08/30/2008 Definitive Surgery    Left mastectomy revealing persistence of disease in the breast and lymph nodes.      09/17/2008 - 11/26/2008 Radiation Therapy    Radiation to left chest wall, supraclavicular, and axillary nodal areas.      11/27/2008 - 04/20/2011 Anti-estrogen oral therapy    Letrozole      03/30/2011 Procedure    Left supraclavicular lymph node biopsy      03/30/2011 Pathology Results    Recurrent breast cancer, invasive ductal type, ER 100%, PR 40%, HER-2 NEGATIVE      04/21/2011 - 06/04/2014 Anti-estrogen oral therapy    Tamoxifen      05/25/2014 PET scan    Progression in left submandibular lymph node, mediastinal lymph nodes, and probable lung involvement.      05/25/2014 Progression    PET scan demonstrates progression of disease.      06/13/2014 Procedure    Left supraclavicular mass biopsy      06/13/2014 Pathology Results    Metastatic carcinoma consistent with breast primary, involving skeletal muscle, perineural invasion, LVI, no lymphoid tissue identified, ER 98%, PR negative, HER-2 negative      06/25/2014 - 10/09/2014 Chemotherapy   Carboplatin/Taxol 6 cycles with excellent response      11/06/2014 - 06/10/2015 Chemotherapy    Palbociclib and Faslodex      01/08/2015 Adverse Reaction    Grade 3 neutropenia with cycle #2 of Palbociclib.      01/08/2015 Treatment Plan Change    Palbociclib dose reduced to 100 mg for cycle #3.      06/10/2015 Progression    Right cervical lymph node progression clinically developed over 4 days.  This lymph node was positive on PET scan in September 2016 and at that time was approximately 8 mm with possible progression in a single bone metastasis.      06/17/2015 Procedure    Biopsy of right cervical lymph node      06/17/2015 Pathology Results    Genetic sequencing performed revealing BRCA2 positivity      07/02/2015 - 08/01/2015 Chemotherapy    Exemestane/everolimus, 25 mg/ 5 mg x 7 days then increasing to 10 mg daily.      08/01/2015 PET scan    Diffuse pneumonitis.        08/01/2015 Adverse Reaction    Pneumonitis secondary to drug reaction from everolimus.      08/23/2015 - 12/23/2015 Chemotherapy    Cisplatin 40 mg/m every other week      11/15/2015 PET scan    Interval resolution of hypermetabolic  airspace opacities in the lungs which were likely inflammatory. Abnormal high activity at the anal rectal junction. Very low grade residual activity at the site of L3 metastatic lesion.      12/23/2015 Treatment Plan Change    Holiday from treatment in preparation for trip to [place].      04/20/2016 -  Chemotherapy    XELODA 1300 mg po bid 7 days on/ 7 days off       05/15/2016 PET scan    1. Partial metabolic response at the hypermetabolic anorectal junction malignancy. 2. No hypermetabolic metastatic disease. Lumbar vertebral sclerotic osseous lesions are non hypermetabolic. 3. Hypermetabolism associated with acute/early subacute fractures of the right clavicle and right anterior second through fifth ribs, without associated bone lesions. 4. Aortic  atherosclerosis.  Three-vessel coronary atherosclerosis.       Squamous cell carcinoma of anus ([place])   12/31/2015 Procedure    Colonoscopy by Dr. Britta Mccreedy      12/31/2015 Pathology Results    Invasive squamous cell carcinoma approximate 4 cm mass posterior wall of rectum      01/13/2016 Treatment Plan Change    Dr. Leighton Ruff consultation- no surgery.  Recommend chemoXRT      01/16/2016 Pathology Results    HPV POSITIVE      02/07/2016 - 03/10/2016 Chemotherapy    Xeloda 1450 mg PO BID _0 /15/2017 PET scan    1. Intense metabolic activity associated mucosal thickening in the anal rectal junction is consistent with residual carcinoma. Mild increase in metabolic activity. 2. No hypermetabolic lymph nodes in the pelvis. 3. No evidence distant metastatic disease. 4. No evidence breast cancer recurrence.       HISTORY OF PRESENT ILLNESS:   [name] is a 66 y.o. female with a medical history significant for Stage IV ER/PR+ breast cancer with initial diagnosis showing HER2 POSITIVITY initially diagnosed with Stage IIIC disease in 2012 and treated curative intent with metastatic disease discovered in October 2015 and osseous involvement noted on PET imaging on 09/10/2014, who is here for a follow up of Stage IV breast cancer and  squamous cell carcinoma of anus. She completed treatment of her anal carcinoma with XELODA/XRT and continues on XELODA therapy. At her last visit she noted a small palpable LN at the  posterior aspect of her neck. This has been biopsied by Dr. Donne Hazel and positive for metastatic breast cancer, prognostic markers are noted above.   Patient is unaccompanied.. She is here to discuss biopsy results.   I personally reviewed and went over labs with the patient.   She stated her pain is tolerable but noticeably worse in the pelvic/anorectal region. She bumped up to 75 mcg fentanyl patch yesterday.  She states she has her abdominal perineal resection surgery next Tuesday. She said she received some reading material for her surgery and that she read it. She notes that her  sister will be with her part of the time during the surgery, but one of her friends will be with her that morning and that night.   States her lynparza comes tomorrow.   She states she had pneumonitis on afinitor previously.  She was a little tearful a couple times during the visit. She notes that she is still positive, she is not unrealistic. She loves life she is remarkably active and independent. She notes that "I am just not ready to die yet.   Review of Systems  Constitutional: Negative.        Pain is tolerable. Went up to 75 mg yesterday on her pain medicine.   HENT: Negative.   Eyes: Negative.   Respiratory: Negative.   Cardiovascular: Negative.   Gastrointestinal: Negative.  Negative for constipation, melena, nausea and vomiting.  Genitourinary: Negative.  Negative for dysuria, frequency, hematuria and urgency.  Musculoskeletal: Negative.  Negative for falls, joint pain and myalgias.  Skin: Negative.   Neurological: Negative.  Negative for dizziness, sensory change, speech change, focal weakness, seizures, loss of consciousness and headaches.  Endo/Heme/Allergies: Negative.   Psychiatric/Behavioral: Positive for depression. The patient is nervous/anxious.   All other systems reviewed and are negative. 14 point review of systems was performed and is negative except as detailed under history of  present illness and above  PAST MEDICAL HISTORY:   Past Medical History:  Diagnosis Date  . Adenocarcinoma of breast ([place]) 01/27/2011   stage IIIc breast cancer  . Bladder infection   . Bone metastasis ([place]) 01/17/2016  . Borderline hypertension   . Breast cancer ([place])   . Closed fracture of humerus sept 2013  . Depression   . Fatigue   . GERD (gastroesophageal reflux disease)   . Hypertension   . 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 Osteoporosis 01/27/2011  . Pneumonia   . Port catheter in place 04/14/2013  . Post-mastectomy lymphedema syndrome   . 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 Reflux   . Retinal detachment   . 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 Swollen lymph nodes   . URI (upper respiratory infection) 02/17/12  . Wears glasses     ALLERGIES: Allergies  Allergen Reactions  . Ciprofloxacin Other (See Comments)    tendonitis      MEDICATIONS: I have reviewed the patient's current medications.    Current Outpatient Prescriptions on File Prior to Visit  Medication Sig Dispense Refill  . Acetylcarnitine HCl (ACETYL L-CARNITINE PO) Take 1,000 mg by mouth daily.    Marland Kitchen CALCIUM-VITAMIN D PO Take 1 tablet by mouth 2 (two) times daily.    Marland Kitchen escitalopram (LEXAPRO) 10 MG tablet Take 5 mg by mouth daily. Patiently is weaning herself off and may not be taking prior to procedure  5  . fentaNYL (DURAGESIC - DOSED MCG/HR) 25 MCG/HR patch Place 1 patch (25 mcg total) onto the skin every 3 (three) days. (Patient taking differently: Place 25 mcg onto the skin every 3 (three) days as needed (pain). Will use all 3 strengths of patches as needed for pain) 10 patch 0  . fentaNYL (DURAGESIC - DOSED MCG/HR) 50 MCG/HR Place 1 patch (50 mcg total) onto the skin every 3 (three) days. (Patient taking differently: Place 50 mcg onto the skin every 3 (three) days as needed. Will use all 3 patch strengths has  needed for pain) 10 patch 0  .  magnesium oxide (MAG-OX) 400 MG tablet Take 400 mg by mouth 2 (two) times daily.    . metoprolol succinate (TOPROL-XL) 25 MG 24 hr tablet TAKE 1 TABLET DAILY  5  . metroNIDAZOLE (FLAGYL) 500 MG tablet Will start 1 day prior to  procedure  0  . mirtazapine (REMERON) 30 MG tablet Take 1 tablet (30 mg total) by mouth at bedtime. Take 15 mg for 3 days then increase to 30 mg (Patient taking differently: Take 30 mg by mouth at bedtime. ) 60 tablet 2  . neomycin (MYCIFRADIN) 500 MG tablet Will start 1 day prior to procedure  0  . nitrofurantoin (MACRODANTIN) 100 MG capsule Take 1 capsule (100 mg total) by mouth daily. (Patient taking differently: Take 100 mg by mouth at bedtime. ) 90 capsule 1  . Olaparib 150 MG TABS Take 300 mg by mouth 2 (two) times daily. 120 tablet 2  . omeprazole (PRILOSEC) 20 MG capsule Take 20 mg by mouth daily as needed (heartburn).    Marland Kitchen omeprazole (PRILOSEC) 40 MG capsule Take 1 capsule (40 mg total) by mouth 2 (two) times daily. (Patient taking differently: Take 40 mg by mouth daily as needed (heartburn). ) 180 capsule 4  . traMADol (ULTRAM) 50 MG tablet Take 50 mg by mouth every 8 (eight) hours as needed for moderate pain.     . vitamin B-12 (CYANOCOBALAMIN) 1000 MCG tablet Take 1,000 mcg by mouth daily.     No current facility-administered medications on file prior to visit.      PAST SURGICAL HISTORY Past Surgical History:  Procedure Laterality Date  . biopsy of lymph node     super clavicle  . BTL    . CATARACT EXTRACTION    . cervical disc fusioni    . LYMPH NODE BIOPSY Right 06/19/2015   Procedure: RIGHT NECK LYMPH NODE EXCISION;  Surgeon: Rolm Bookbinder, MD;  Location: [facility];  Service: General;  Laterality: Right;  . MASS EXCISION Left 07/27/2016   Procedure: EXCISION OF POSTERIOR NECK MASS;  Surgeon: Rolm Bookbinder, MD;  Location: [facility];  Service: General;  Laterality: Left;  Marland Kitchen  MASTECTOMY MODIFIED RADICAL     left breast  . Port- a-cath insertion    . retinal detachment and repair      FAMILY HISTORY: Family History  Problem Relation Age of Onset  . Hypertension Mother   . Heart disease Mother     atrial fib  . Heart disease Father   . Other Father     glaucoma    SOCIAL HISTORY:  reports that she has never smoked. She has never used smokeless tobacco. She reports that she drinks about 1.8 oz of alcohol per week . She reports that she uses drugs, including Marijuana.  Social History   Social History  . Marital status: Divorced    Spouse name: N/A  . Number of children: N/A  . Years of education: N/A   Social History Main Topics  . Smoking status: Never Smoker  . Smokeless tobacco: Never Used  . Alcohol use 1.8 oz/week    3 Glasses of wine per week     Comment: Drinks 3 drinks very other week  . Drug use:     Types: Marijuana     Comment: During chemo, none over 1 Year  . Sexual activity: No   Other Topics Concern  . None   Social History Narrative  . None    PERFORMANCE STATUS: The  patient's performance status is 1 - Symptomatic but completely ambulatory   PHYSICAL EXAM: Most Recent Vital Signs: Blood pressure (!) 128/59, pulse 83, temperature 98.8 F (37.1 C), temperature source Oral, resp. rate 16, weight 135 lb 14.4 oz (61.6 kg), SpO2 100 %. Physical Exam  Constitutional: She is oriented to person, place, and time and well-developed, well-nourished, and in no distress.  HENT:  Head: Normocephalic and atraumatic.  Mouth/Throat: Oropharynx is clear and moist. No oropharyngeal exudate.  Eyes: Conjunctivae and EOM are normal. Pupils are equal, round, and reactive to light. No scleral icterus.  Neck: Normal range of motion. Neck supple. No thyromegaly present.  Well healing incision at surgical site  Cardiovascular: Normal rate, regular rhythm and normal heart sounds.   Pulmonary/Chest: Effort normal and breath sounds normal.    Abdominal: Soft. Bowel sounds are normal. She exhibits no distension. There is no tenderness. There is no rebound.  Musculoskeletal: Normal range of motion. She exhibits no edema.  Lymphadenopathy:    She has no cervical adenopathy.  Neurological: She is alert and oriented to person, place, and time. Gait normal.  Skin: Skin is warm and dry.  Psychiatric: Mood, memory, affect and judgment normal.  Nursing note and vitals reviewed.   LABORATORY DATA:  I have reviewed the data as listed. Results for HARUKO, MERSCH (MRN [id number]) as of 08/25/2016 14:16  Ref. Range 08/25/2016 09:58  Sodium Latest Ref Range: 135 - 145 mmol/L 136  Potassium Latest Ref Range: 3.5 - 5.1 mmol/L 4.0  Chloride Latest Ref Range: 101 - 111 mmol/L 101  CO2 Latest Ref Range: 22 - 32 mmol/L 28  Glucose Latest Ref Range: 65 - 99 mg/dL 77  BUN Latest Ref Range: 6 - 20 mg/dL 13  Creatinine Latest Ref Range: 0.44 - 1.00 mg/dL 0.98  Calcium Latest Ref Range: 8.9 - 10.3 mg/dL 9.5  Anion gap Latest Ref Range: 5 - 15  7  Alkaline Phosphatase Latest Ref Range: 38 - 126 U/L 64  Albumin Latest Ref Range: 3.5 - 5.0 g/dL 3.9  AST Latest Ref Range: 15 - 41 U/L 24  ALT Latest Ref Range: 14 - 54 U/L 16  Total Protein Latest Ref Range: 6.5 - 8.1 g/dL 7.0  Total Bilirubin Latest Ref Range: 0.3 - 1.2 mg/dL 0.3  EGFR (African American) Latest Ref Range: >60 mL/min >60  EGFR (Non-African Amer.) Latest Ref Range: >60 mL/min >60  Ferritin Latest Ref Range: 11 - 307 ng/mL 72  Folate Latest Ref Range: >5.9 ng/mL 33.9  Vitamin B12 Latest Ref Range: 180 - 914 pg/mL 882  WBC Latest Ref Range: 4.0 - 10.5 K/uL 8.6  RBC Latest Ref Range: 3.87 - 5.11 MIL/uL 3.23 (L)  Hemoglobin Latest Ref Range: 12.0 - 15.0 g/dL 12.0  HCT Latest Ref Range: 36.0 - 46.0 % 35.7 (L)  MCV Latest Ref Range: 78.0 - 100.0 fL 110.5 (H)  MCH Latest Ref Range: 26.0 - 34.0 pg 37.2 (H)  MCHC Latest Ref Range: 30.0 - 36.0 g/dL 33.6  RDW Latest Ref Range: 11.5 - 15.5 %  13.4  Platelets Latest Ref Range: 150 - 400 K/uL 261  Neutrophils Latest Units: % 77  Lymphocytes Latest Units: % 13  Monocytes Relative Latest Units: % 9  Eosinophil Latest Units: % 1  Basophil Latest Units: % 0  NEUT# Latest Ref Range: 1.7 - 7.7 K/uL 6.6  Lymphocyte # Latest Ref Range: 0.7 - 4.0 K/uL 1.1  Monocyte # Latest Ref Range: 0.1 - 1.0 K/uL  0.8  Eosinophils Absolute Latest Ref Range: 0.0 - 0.7 K/uL 0.1  Basophils Absolute Latest Ref Range: 0.0 - 0.1 K/uL 0.0    RADIOGRAPHY: I have personally reviewed the radiological images as listed and agreed with the findings in the report. Study Result   CLINICAL DATA:  Subsequent treatment strategy for squamous cell anal carcinoma. Additional history of LEFT breast cancer.  EXAM: NUCLEAR MEDICINE PET SKULL BASE TO THIGH  TECHNIQUE: 6.9 mCi F-18 FDG was injected intravenously. Full-ring PET imaging was performed from the skull base to thigh after the radiotracer. CT data was obtained and used for attenuation correction and anatomic localization.  FASTING BLOOD GLUCOSE:  Value: 100 mg/dl  COMPARISON:  PET-CT 05/15/2016  FINDINGS: NECK  No hypermetabolic lymph nodes in the neck.  CHEST  No hypermetabolic axillary supraclavicular adenopathy. No hypermetabolic mediastinal adenopathy.  Port in the RIGHT chest.  Angular nodule in the RIGHT upper lobe measures 10 mm. No change from prior and no metabolic activity.  ABDOMEN/PELVIS  No abnormal hypermetabolic activity within the liver, pancreas, adrenal glands, or spleen. No hypermetabolic lymph nodes in the abdomen or pelvis.  Intense metabolic activity at the anorectal junction with associated mucosal thickening. Single wall thickening measures 12 mm compared to approximately 9 mm. Metabolic activity associated with an rectal thickening is mildly increased SUV max equal 12.5 increased from 11.7.  SKELETON  No focal hypermetabolic activity to suggest  skeletal metastasis. Rule decrease in metabolic activity associated with the LEFT clavicle fracture.  IMPRESSION: 1. Intense metabolic activity associated mucosal thickening in the anal rectal junction is consistent with residual carcinoma. Mild increase in metabolic activity. 2. No hypermetabolic lymph nodes in the pelvis. 3. No evidence distant metastatic disease. 4. No evidence breast cancer recurrence.   Electronically Signed   By: Suzy Bouchard M.D.   On: 07/24/2016 14:51    PATHOLOGY:        ASSESSMENT/PLAN:  Excellent PS Squamous cell carcinoma of anus ([facility]) Adenocarcinoma of left breast BRCA2 Positive XELODA related skin toxicity Cancer related pain  Stage IV breast cancer ER/PR positive, HER-2/neu negative. BRCA2 POSITIVE.  History of stage IIIc left breast cancer in 2009 treated with neoadjuvant chemotherapy consisting of epirubicin and Cytoxan followed by 4 cycles of docetaxel from 04/16/2008-08/13/2008 with the initiation of Herceptin for 52 weeks at the start of docetaxel. She then underwent a left mastectomy on 08/30/2008 showing persistent disease in the breast and lymph nodes. This was followed by radiation therapy from 09/17/2008-11/26/2008 to the left chest wall, supraclavicular, and axillary node areas. She then moved on to antiestrogen therapy with letrozole (11/27/2008-04/20/2011). She was found to have recurrent breast cancer on supraclavicular lymph node biopsy on 03/30/2011 leading to a change in antiestrogen therapy to tamoxifen (04/21/2011-06/04/2014). Pet imaging on 05/25/2014 demonstrated progression of left submandibular lymph node, mediastinal lymph node, and probable lung involvement. This was followed by a biopsy on 06/13/2014 of a left supraclavicular mass that did reveal metastatic carcinoma consistent with breast primary. She was then treated with carboplatin and Taxol for 6 cycles with an excellent response (06/25/2014-10/09/2014). She was then  transition back to hormone therapy consisting of Ibrance and Faslodex (11/13/2014-06/10/2015). This was complicated by grade 3 neutropenia with cycle #2 resulting in a dose reduction of Ibrance to 100 mg for cycle 3. Unfortunately, on 06/10/2015, she was noted to have progression of disease in the right mid cervical lymph node over a 4 day period that was PET avid. Therapy was therefore changed to exemestane and  everolimus (16/05/9603-54/04/8118) that was complicated by an adverse reaction consisting of diffuse pneumonitis. This regimen was therefore discontinued. She then transitioned back to systemic chemotherapy consisting of cisplatin every 14 days (08/23/2015-12/23/2015). She was put on a drug holiday beginning on 12/23/2015 in preparation for a trip to [place] on 01/25/2016.  PET was reviewed in detail. In regards to her breast cancer, PET is essentially negative. Imaging is noted above. Nodal disease in the neck was not detected. We reviewed her pathology, prognostic markers are reviewed. Disease is still HER 2 negative. Pathology is noted above.  At this point I am most concerned about her anal carcinoma based upon imaging and her increasing pain. She is scheduled for an APR.This is the best choice for her. Medea is independent and active. This should not slow her down.  In regards to her breast cancer she has several options. Although her disease is not triple negative/BRCA positive, Olaparib is still an option given that she is HER 2 -/BRCA positive. She has not tried ixabepilone, erbulin. Her anal carcinoma may make her ineligible for clinical trial.  She will receive her medication tomorrow. She will start with follow-up in two weeks with labs.   Bone metastasis ([facility]) Bone metastases from breast cancer. On Xgeva therapy. She is also taking calcium and vitamin D.  ---------------------------------------------------------- Labs reviewed. Results noted above.   Her lynparza comes tomorrow. I  talked to her about the symptoms of taking lynparza.   I talked to her about what to expect for her surgery and recovery.  Today when she leaves I will check her baseline blood counts and iron levels. She will get them checked again in 3 weeks.    She was a little tearful a couple times during the visit, we discussed a lot of the emotional issues she is dealing with.  She will follow up 1 week after her surgery.     Orders Placed This Encounter  Procedures  . CBC with Differential    Standing Status:   Future    Number of Occurrences:   1    Standing Expiration Date:   08/25/2017  . Comprehensive metabolic panel    Standing Status:   Future    Number of Occurrences:   1    Standing Expiration Date:   08/25/2017  . Ferritin    Standing Status:   Future    Number of Occurrences:   1    Standing Expiration Date:   08/25/2017  . Vitamin B12    Standing Status:   Future    Number of Occurrences:   1    Standing Expiration Date:   08/25/2017  . Folate    Standing Status:   Future    Number of Occurrences:   1    Standing Expiration Date:   08/25/2017  . CBC with Differential    Standing Status:   Standing    Number of Occurrences:   6    Standing Expiration Date:   08/25/2017  . Comprehensive metabolic panel    Standing Status:   Standing    Number of Occurrences:   6    Standing Expiration Date:   08/25/2017    All questions were answered. The patient knows to call the clinic with any problems, questions or concerns. We can certainly see the patient much sooner if necessary.  This document serves as a record of services personally performed by Ancil Linsey, MD. It was created on her behalf by Shirlean Mylar,  a trained medical scribe. The creation of this record is based on the scribe's personal observations and the provider's statements to them. This document has been checked and approved by the attending provider.   I have reviewed the above documentation for accuracy and  completeness, and I agree with the above.  This note is electronically signed by: Molli Hazard, MD   08/25/2016 9:13 AM

## 2016-08-25 NOTE — Patient Instructions (Signed)
[place] at [facility] Discharge Instructions  RECOMMENDATIONS MADE BY THE CONSULTANT AND ANY TEST RESULTS WILL BE SENT TO YOUR REFERRING PHYSICIAN.  You were seen today by Dr. Whitney Muse. Rx given for Fentanyl patches. Diarrhea sheet given. Baseline labs today, will call you with results. Return on February 2nd for follow up and labs.   Thank you for choosing [place] at [facility] to provide your oncology and hematology care.  To afford each patient quality time with our provider, please arrive at least 15 minutes before your scheduled appointment time.    If you have a lab appointment with the [facility] please come in thru the  Main Entrance and check in at the main information desk  You need to re-schedule your appointment should you arrive 10 or more minutes late.  We strive to give you quality time with our providers, and arriving late affects you and other patients whose appointments are after yours.  Also, if you no show three or more times for appointments you may be dismissed from the clinic at the providers discretion.     Again, thank you for choosing [facility].  Our hope is that these requests will decrease the amount of time that you wait before being seen by our physicians.       _____________________________________________________________  Should you have questions after your visit to [facility], please contact our office at [phone] between the hours of 8:30 a.m. and 4:30 p.m.  Voicemails left after 4:30 p.m. will not be returned until the following business day.  For prescription refill requests, have your pharmacy contact our office.       Resources For Cancer Patients and their Caregivers ? American Cancer Society: Can assist with transportation, wigs, general needs, runs Look Good Feel Better.        ([phone] ? Cancer Care: Provides financial assistance, online support  groups, medication/co-pay assistance.  1-800-813-HOPE (515)127-9241) ? [place] Assists [place] cancer patients and their families through emotional , educational and financial support.  501-109-8581 ? [place] DSS Where to apply for food stamps, Medicaid and utility assistance. (737)475-6289 ? [employer]: Transportation to medical appointments. 343-276-8380 ? Social Security Administration: May apply for disability if have a Stage IV cancer. 8306825327 (276)726-4019 ? [employer], Disability and Transit Services: Assists with nutrition, care and transit needs. [place] Support Programs: @10RELATIVEDAYS @ > Cancer Support Group  2nd Tuesday of the month 1pm-2pm, Journey Room  > Creative Journey  3rd Tuesday of the month 1130am-1pm, Journey Room  > Look Good Feel Better  1st Wednesday of the month 10am-12 noon, Journey Room (Call [place] to register [phone])

## 2016-08-27 NOTE — Progress Notes (Signed)
07/14/2016- noted in EPIC-EKG.  03/16/2016- noted in EPIC- Stress test

## 2016-08-27 NOTE — Patient Instructions (Addendum)
EUREKA WUOLLET  08/27/2016   Your procedure is scheduled on: Tuesday 09/01/2016  Report to [facility] Main  Entrance take [facility]  elevators to 3rd floor to  [place] at   1100 AM.  Call this number if you have problems the morning of surgery [phone]   Remember: ONLY 1 PERSON MAY GO WITH YOU TO SHORT STAY TO GET  READY MORNING OF [place].                      Follow Bowel Prep instructions from Dr. Marcello Moores office along with a clear liquid diet all day on Monday 08/31/2016 up until midnight!    CLEAR LIQUID DIET   Foods Allowed                                                                     Foods Excluded  Coffee and tea, regular and decaf                             liquids that you cannot  Plain Jell-O in any flavor                                             see through such as: Fruit ices (not with fruit pulp)                                     milk, soups, orange juice  Iced Popsicles                                    All solid food Carbonated beverages, regular and diet                                    Cranberry, grape and apple juices Sports drinks like Gatorade Lightly seasoned clear broth or consume(fat free) Sugar, honey syrup  Sample Menu Breakfast                                Lunch                                     Supper Cranberry juice                    Beef broth                            Chicken broth Jell-O  Grape juice                           Apple juice Coffee or tea                        Jell-O                                      Popsicle                                                Coffee or tea                        Coffee or tea  _____________________________________________________________________     Do not eat food  :After Midnight.on Sunday 08/30/2016!   MAY HAVE CLEAR LIQUIDS FROM MIDNIGHT UP UNTIL 0700 AM MORNING OF SURGERY THEN NOTHING IN MOUTH AFTER  0700 AM UNTIL AFTER SURGERY!    Take these medicines the morning of surgery with A SIP OF WATER:   Metopolol, Omeprazole (Prilosec), Olaparib                                 You may not have any metal on your body including hair pins and              piercings  Do not wear jewelry, make-up, lotions, powders or perfumes, deodorant             Do not wear nail polish.  Do not shave  48 hours prior to surgery.              Men may shave face and neck.   Do not bring valuables to the hospital. [place].  Contacts, dentures or bridgework may not be worn into surgery.  Leave suitcase in the car. After surgery it may be brought to your room.      Please read over the following fact sheets you were given: _____________________________________________________________________             [facility] - Preparing for Surgery Before surgery, you can play an important role.  Because skin is not sterile, your skin needs to be as free of germs as possible.  You can reduce the number of germs on your skin by washing with CHG (chlorahexidine gluconate) soap before surgery.  CHG is an antiseptic cleaner which kills germs and bonds with the skin to continue killing germs even after washing. Please DO NOT use if you have an allergy to CHG or antibacterial soaps.  If your skin becomes reddened/irritated stop using the CHG and inform your nurse when you arrive at Short Stay. Do not shave (including legs and underarms) for at least 48 hours prior to the first CHG shower.  You may shave your face/neck. Please follow these instructions carefully:  1.  Shower with CHG Soap the night before surgery and the  morning of Surgery.  2.  If you choose to wash your hair, wash your hair first as  usual with your  normal  shampoo.  3.  After you shampoo, rinse your hair and body thoroughly to remove the  shampoo.                           4.  Use CHG as you would any other  liquid soap.  You can apply chg directly  to the skin and wash                       Gently with a scrungie or clean washcloth.  5.  Apply the CHG Soap to your body ONLY FROM THE NECK DOWN.   Do not use on face/ open                           Wound or open sores. Avoid contact with eyes, ears mouth and genitals (private parts).                       Wash face,  Genitals (private parts) with your normal soap.             6.  Wash thoroughly, paying special attention to the area where your surgery  will be performed.  7.  Thoroughly rinse your body with warm water from the neck down.  8.  DO NOT shower/wash with your normal soap after using and rinsing off  the CHG Soap.                9.  Pat yourself dry with a clean towel.            10.  Wear clean pajamas.            11.  Place clean sheets on your bed the night of your first shower and do not  sleep with pets. Day of Surgery : Do not apply any lotions/deodorants the morning of surgery.  Please wear clean clothes to the hospital/surgery center.  FAILURE TO FOLLOW THESE INSTRUCTIONS MAY RESULT IN THE CANCELLATION OF YOUR SURGERY PATIENT SIGNATURE_________________________________  NURSE SIGNATURE__________________________________  _

## 2016-08-28 ENCOUNTER — Encounter (HOSPITAL_COMMUNITY)
Admission: RE | Admit: 2016-08-28 | Discharge: 2016-08-28 | Disposition: A | Discharge status: Home / Self Care | Payer: BLUE CROSS/BLUE SHIELD | Source: Ambulatory Visit | Attending: General Surgery | Admitting: General Surgery

## 2016-08-28 ENCOUNTER — Encounter (HOSPITAL_COMMUNITY): Payer: Self-pay

## 2016-08-28 DIAGNOSIS — Z01818 Encounter for other preprocedural examination: Secondary | ICD-10-CM | POA: Insufficient documentation

## 2016-08-28 HISTORY — DX: Family history of other specified conditions: Z84.89

## 2016-08-28 LAB — CBC
HCT: 32.6 % — ABNORMAL LOW (ref 36.0–46.0)
Hemoglobin: 10.9 g/dL — ABNORMAL LOW (ref 12.0–15.0)
MCH: 35.6 pg — ABNORMAL HIGH (ref 26.0–34.0)
MCHC: 33.4 g/dL (ref 30.0–36.0)
MCV: 106.5 fL — ABNORMAL HIGH (ref 78.0–100.0)
Platelets: 272 10*3/uL (ref 150–400)
RBC: 3.06 MIL/uL — ABNORMAL LOW (ref 3.87–5.11)
RDW: 13.5 % (ref 11.5–15.5)
WBC: 9.6 10*3/uL (ref 4.0–10.5)

## 2016-08-28 LAB — BASIC METABOLIC PANEL
Anion gap: 6 (ref 5–15)
BUN: 21 mg/dL — ABNORMAL HIGH (ref 6–20)
CO2: 28 mmol/L (ref 22–32)
Calcium: 9.1 mg/dL (ref 8.9–10.3)
Chloride: 104 mmol/L (ref 101–111)
Creatinine, Ser: 1.15 mg/dL — ABNORMAL HIGH (ref 0.44–1.00)
GFR calc Af Amer: 57 mL/min — ABNORMAL LOW (ref >60)
GFR calc non Af Amer: 50 mL/min — ABNORMAL LOW (ref >60)
Glucose, Bld: 102 mg/dL — ABNORMAL HIGH (ref 65–99)
Potassium: 4.7 mmol/L (ref 3.5–5.1)
Sodium: 138 mmol/L (ref 135–145)

## 2016-08-28 LAB — ABO/RH: ABO/RH(D): A POS

## 2016-08-28 NOTE — Consult Note (Addendum)
[place] Nurse requested for preoperative stoma site marking  Discussed surgical procedure and stoma creation with patient.  Explained role of the [place] nurse team.  Provided the patient with educational booklet/DVD and provided samples of pouching options.    Examined patient sitting and standing in order to place the marking in the patient's visual field, away from any creases or abdominal contour issues and within the rectus muscle.  Attempted to mark below the patient's belt line, but this was not possible related to 2 significant skin folds which occur when patient is leaning forward and should be avoided if possible.   Marked for colostomy in the LUQ  _6___ cm to the left of the umbilicus and [id number] above the umbilicus.  Patient's abdomen cleansed with CHG wipes at site markings, allowed to air dry prior to marking.Covered mark with thin film transparent dressing to preserve mark until date of surgery. She states sometimes she is allergic to dressings; informed her she can remove and re-draw the mark with the pen which she was given to take home if this occurs.  [place] Nurse team will follow up with patient after surgery for continue ostomy care and teaching. Briefly discussed pouching routines and she denies further questions at this time.  Julien Girt MSN, RN, [place], [place], [place]

## 2016-08-29 LAB — HEMOGLOBIN A1C
Hgb A1c MFr Bld: 5.3 % (ref 4.8–5.6)
Mean Plasma Glucose: 105 mg/dL

## 2016-09-01 ENCOUNTER — Inpatient Hospital Stay (HOSPITAL_COMMUNITY): Payer: BLUE CROSS/BLUE SHIELD | Admitting: Anesthesiology

## 2016-09-01 ENCOUNTER — Encounter (HOSPITAL_COMMUNITY): Payer: Self-pay | Admitting: *Deleted

## 2016-09-01 ENCOUNTER — Encounter (HOSPITAL_COMMUNITY)
Admission: RE | Disposition: A | Discharge status: Home / Self Care | Payer: Self-pay | Source: Ambulatory Visit | Attending: General Surgery

## 2016-09-01 ENCOUNTER — Inpatient Hospital Stay (HOSPITAL_COMMUNITY)
Admission: RE | Admit: 2016-09-01 | Discharge: 2016-09-05 | DRG: 330 | Disposition: A | Discharge status: Home / Self Care | Payer: BLUE CROSS/BLUE SHIELD | Source: Ambulatory Visit | Attending: General Surgery | Admitting: General Surgery

## 2016-09-01 DIAGNOSIS — N736 Female pelvic peritoneal adhesions (postinfective): Secondary | ICD-10-CM | POA: Diagnosis present

## 2016-09-01 DIAGNOSIS — K219 Gastro-esophageal reflux disease without esophagitis: Secondary | ICD-10-CM | POA: Diagnosis present

## 2016-09-01 DIAGNOSIS — C20 Malignant neoplasm of rectum: Secondary | ICD-10-CM | POA: Diagnosis present

## 2016-09-01 DIAGNOSIS — I10 Essential (primary) hypertension: Secondary | ICD-10-CM | POA: Diagnosis present

## 2016-09-01 DIAGNOSIS — M810 Age-related osteoporosis without current pathological fracture: Secondary | ICD-10-CM | POA: Diagnosis present

## 2016-09-01 DIAGNOSIS — C210 Malignant neoplasm of anus, unspecified: Secondary | ICD-10-CM | POA: Diagnosis present

## 2016-09-01 DIAGNOSIS — Z853 Personal history of malignant neoplasm of breast: Secondary | ICD-10-CM | POA: Diagnosis not present

## 2016-09-01 HISTORY — PX: ABDOMINAL PERINEAL BOWEL RESECTION: SHX1111

## 2016-09-01 LAB — TYPE AND SCREEN
ABO/RH(D): A POS
Antibody Screen: NEGATIVE

## 2016-09-01 SURGERY — PROCTECTOMY, ABDOMINOPERINEAL
Anesthesia: General | Site: Abdomen

## 2016-09-01 MED ORDER — ALVIMOPAN 12 MG PO CAPS
12.0000 mg | ORAL_CAPSULE | Freq: Once | ORAL | Status: AC
  Administered 2016-09-01: 12 mg via ORAL
  Filled 2016-09-01: qty 1

## 2016-09-01 MED ORDER — LIDOCAINE 2% (20 MG/ML) 5 ML SYRINGE
INTRAMUSCULAR | Status: AC
  Filled 2016-09-01: qty 5

## 2016-09-01 MED ORDER — SUGAMMADEX SODIUM 200 MG/2ML IV SOLN
INTRAVENOUS | Status: AC
  Filled 2016-09-01: qty 2

## 2016-09-01 MED ORDER — ACETAMINOPHEN 500 MG PO TABS
1000.0000 mg | ORAL_TABLET | ORAL | Status: AC
  Administered 2016-09-01: 1000 mg via ORAL
  Filled 2016-09-01: qty 2

## 2016-09-01 MED ORDER — LACTATED RINGERS IV SOLN
INTRAVENOUS | Status: DC
  Administered 2016-09-01 (×2): via INTRAVENOUS
  Administered 2016-09-01: 1000 mL via INTRAVENOUS
  Administered 2016-09-01: 14:00:00 via INTRAVENOUS

## 2016-09-01 MED ORDER — METOPROLOL SUCCINATE ER 25 MG PO TB24
25.0000 mg | ORAL_TABLET | Freq: Every day | ORAL | Status: DC
  Administered 2016-09-02 – 2016-09-05 (×4): 25 mg via ORAL
  Filled 2016-09-01 (×5): qty 1

## 2016-09-01 MED ORDER — OXYCODONE HCL 5 MG/5ML PO SOLN
5.0000 mg | Freq: Once | ORAL | Status: DC | PRN
  Filled 2016-09-01: qty 5

## 2016-09-01 MED ORDER — FENTANYL 25 MCG/HR TD PT72
25.0000 ug | MEDICATED_PATCH | TRANSDERMAL | Status: DC
  Filled 2016-09-01: qty 1

## 2016-09-01 MED ORDER — HYDROMORPHONE HCL 1 MG/ML IJ SOLN
0.2500 mg | INTRAMUSCULAR | Status: DC | PRN
  Administered 2016-09-01 (×3): 0.5 mg via INTRAVENOUS

## 2016-09-01 MED ORDER — DEXAMETHASONE SODIUM PHOSPHATE 10 MG/ML IJ SOLN
INTRAMUSCULAR | Status: DC | PRN
  Administered 2016-09-01: 10 mg via INTRAVENOUS

## 2016-09-01 MED ORDER — LACTATED RINGERS IV SOLN
INTRAVENOUS | Status: DC
  Administered 2016-09-01 – 2016-09-02 (×2): 100 mL/h via INTRAVENOUS

## 2016-09-01 MED ORDER — SUGAMMADEX SODIUM 200 MG/2ML IV SOLN
INTRAVENOUS | Status: DC | PRN
  Administered 2016-09-01: 150 mg via INTRAVENOUS

## 2016-09-01 MED ORDER — NALOXONE HCL 0.4 MG/ML IJ SOLN: 0.4000 mg | INTRAMUSCULAR | Status: DC | PRN

## 2016-09-01 MED ORDER — OXYCODONE HCL 5 MG PO TABS: 5.0000 mg | ORAL_TABLET | Freq: Once | ORAL | Status: DC | PRN

## 2016-09-01 MED ORDER — MIDAZOLAM HCL 2 MG/2ML IJ SOLN
INTRAMUSCULAR | Status: DC | PRN
  Administered 2016-09-01 (×2): 1 mg via INTRAVENOUS

## 2016-09-01 MED ORDER — PROMETHAZINE HCL 25 MG/ML IJ SOLN
6.2500 mg | INTRAMUSCULAR | Status: DC | PRN
Start: 2016-09-01 — End: 2016-09-01
  Administered 2016-09-01: 6.25 mg via INTRAVENOUS

## 2016-09-01 MED ORDER — ONDANSETRON HCL 4 MG/2ML IJ SOLN
INTRAMUSCULAR | Status: AC
  Filled 2016-09-01: qty 2

## 2016-09-01 MED ORDER — HYDROMORPHONE HCL 1 MG/ML IJ SOLN
INTRAMUSCULAR | Status: AC
  Administered 2016-09-01: 0.5 mg via INTRAVENOUS
  Filled 2016-09-01: qty 1

## 2016-09-01 MED ORDER — DEXAMETHASONE SODIUM PHOSPHATE 10 MG/ML IJ SOLN
INTRAMUSCULAR | Status: AC
  Filled 2016-09-01: qty 1

## 2016-09-01 MED ORDER — MORPHINE SULFATE 2 MG/ML IV SOLN
INTRAVENOUS | Status: DC
  Administered 2016-09-01: 16:00:00 via INTRAVENOUS
  Administered 2016-09-01: 18 mg via INTRAVENOUS
  Administered 2016-09-02: 1.5 mg via INTRAVENOUS
  Administered 2016-09-02: 7.5 mg via INTRAVENOUS
  Administered 2016-09-02: 18 mg via INTRAVENOUS
  Administered 2016-09-02: 6 mg via INTRAVENOUS
  Filled 2016-09-01 (×2): qty 25

## 2016-09-01 MED ORDER — MIRTAZAPINE 15 MG PO TABS
30.0000 mg | ORAL_TABLET | Freq: Every day | ORAL | Status: DC
  Administered 2016-09-01 – 2016-09-04 (×4): 30 mg via ORAL
  Filled 2016-09-01 (×6): qty 2

## 2016-09-01 MED ORDER — DIPHENHYDRAMINE HCL 50 MG/ML IJ SOLN: 12.5000 mg | Freq: Four times a day (QID) | INTRAMUSCULAR | Status: DC | PRN

## 2016-09-01 MED ORDER — CELECOXIB 200 MG PO CAPS
400.0000 mg | ORAL_CAPSULE | ORAL | Status: AC
  Administered 2016-09-01: 400 mg via ORAL
  Filled 2016-09-01: qty 2

## 2016-09-01 MED ORDER — LIDOCAINE 2% (20 MG/ML) 5 ML SYRINGE
INTRAMUSCULAR | Status: DC | PRN
  Administered 2016-09-01: 60 mg via INTRAVENOUS

## 2016-09-01 MED ORDER — TRAMADOL HCL 50 MG PO TABS: 50.0000 mg | ORAL_TABLET | Freq: Three times a day (TID) | ORAL | Status: DC | PRN

## 2016-09-01 MED ORDER — ACETAMINOPHEN 500 MG PO TABS
1000.0000 mg | ORAL_TABLET | Freq: Four times a day (QID) | ORAL | Status: AC
  Administered 2016-09-01 – 2016-09-02 (×4): 1000 mg via ORAL
  Filled 2016-09-01 (×3): qty 2

## 2016-09-01 MED ORDER — SODIUM CHLORIDE 0.9 % IJ SOLN
INTRAMUSCULAR | Status: AC
Start: 2016-09-01 — End: 2016-09-02
  Filled 2016-09-01: qty 10

## 2016-09-01 MED ORDER — FENTANYL CITRATE (PF) 100 MCG/2ML IJ SOLN
INTRAMUSCULAR | Status: AC
  Filled 2016-09-01: qty 2

## 2016-09-01 MED ORDER — PROPOFOL 10 MG/ML IV BOLUS
INTRAVENOUS | Status: DC | PRN
  Administered 2016-09-01: 150 mg via INTRAVENOUS

## 2016-09-01 MED ORDER — SUCCINYLCHOLINE CHLORIDE 200 MG/10ML IV SOSY
PREFILLED_SYRINGE | INTRAVENOUS | Status: DC | PRN
  Administered 2016-09-01: 100 mg via INTRAVENOUS

## 2016-09-01 MED ORDER — OLAPARIB 150 MG PO TABS
300.0000 mg | ORAL_TABLET | Freq: Two times a day (BID) | ORAL | Status: DC
  Administered 2016-09-01 – 2016-09-05 (×8): 300 mg via ORAL

## 2016-09-01 MED ORDER — ONDANSETRON HCL 4 MG/2ML IJ SOLN
INTRAMUSCULAR | Status: DC | PRN
  Administered 2016-09-01: 4 mg via INTRAVENOUS

## 2016-09-01 MED ORDER — ALBUMIN HUMAN 5 % IV SOLN
INTRAVENOUS | Status: DC | PRN
  Administered 2016-09-01: 15:00:00 via INTRAVENOUS

## 2016-09-01 MED ORDER — ONDANSETRON HCL 4 MG/2ML IJ SOLN
4.0000 mg | Freq: Four times a day (QID) | INTRAMUSCULAR | Status: DC | PRN
Start: 2016-09-01 — End: 2016-09-02

## 2016-09-01 MED ORDER — CHLORHEXIDINE GLUCONATE 0.12 % MT SOLN
15.0000 mL | Freq: Two times a day (BID) | OROMUCOSAL | Status: DC
  Administered 2016-09-01 – 2016-09-05 (×6): 15 mL via OROMUCOSAL
  Filled 2016-09-01 (×8): qty 15

## 2016-09-01 MED ORDER — CEFOTETAN DISODIUM-DEXTROSE 2-2.08 GM-% IV SOLR
INTRAVENOUS | Status: AC
  Filled 2016-09-01: qty 50

## 2016-09-01 MED ORDER — PROPOFOL 10 MG/ML IV BOLUS
INTRAVENOUS | Status: AC
  Filled 2016-09-01: qty 20

## 2016-09-01 MED ORDER — ALVIMOPAN 12 MG PO CAPS
12.0000 mg | ORAL_CAPSULE | Freq: Two times a day (BID) | ORAL | Status: DC
  Administered 2016-09-02 – 2016-09-04 (×5): 12 mg via ORAL
  Filled 2016-09-01 (×5): qty 1

## 2016-09-01 MED ORDER — DIPHENHYDRAMINE HCL 12.5 MG/5ML PO ELIX: 12.5000 mg | ORAL_SOLUTION | Freq: Four times a day (QID) | ORAL | Status: DC | PRN

## 2016-09-01 MED ORDER — PROMETHAZINE HCL 25 MG/ML IJ SOLN
INTRAMUSCULAR | Status: AC
  Administered 2016-09-01: 6.25 mg via INTRAVENOUS
  Filled 2016-09-01: qty 1

## 2016-09-01 MED ORDER — ROCURONIUM BROMIDE 50 MG/5ML IV SOSY
PREFILLED_SYRINGE | INTRAVENOUS | Status: DC | PRN
  Administered 2016-09-01: 30 mg via INTRAVENOUS
  Administered 2016-09-01: 20 mg via INTRAVENOUS
  Administered 2016-09-01: 5 mg via INTRAVENOUS
  Administered 2016-09-01: 10 mg via INTRAVENOUS

## 2016-09-01 MED ORDER — ALUM & MAG HYDROXIDE-SIMETH 200-200-20 MG/5ML PO SUSP: 30.0000 mL | Freq: Four times a day (QID) | ORAL | Status: DC | PRN

## 2016-09-01 MED ORDER — SUCCINYLCHOLINE CHLORIDE 200 MG/10ML IV SOSY
PREFILLED_SYRINGE | INTRAVENOUS | Status: AC
  Filled 2016-09-01: qty 10

## 2016-09-01 MED ORDER — ROCURONIUM BROMIDE 50 MG/5ML IV SOSY
PREFILLED_SYRINGE | INTRAVENOUS | Status: AC
  Filled 2016-09-01: qty 5

## 2016-09-01 MED ORDER — ORAL CARE MOUTH RINSE
15.0000 mL | Freq: Two times a day (BID) | OROMUCOSAL | Status: DC
  Administered 2016-09-02 – 2016-09-05 (×7): 15 mL via OROMUCOSAL

## 2016-09-01 MED ORDER — SODIUM CHLORIDE 0.9% FLUSH: 9.0000 mL | INTRAVENOUS | Status: DC | PRN

## 2016-09-01 MED ORDER — HYDROMORPHONE HCL 1 MG/ML IJ SOLN
0.2500 mg | INTRAMUSCULAR | Status: DC | PRN
  Administered 2016-09-01 (×5): 0.5 mg via INTRAVENOUS

## 2016-09-01 MED ORDER — 0.9 % SODIUM CHLORIDE (POUR BTL) OPTIME
TOPICAL | Status: DC | PRN
  Administered 2016-09-01: 4000 mL

## 2016-09-01 MED ORDER — PANTOPRAZOLE SODIUM 40 MG PO TBEC
40.0000 mg | DELAYED_RELEASE_TABLET | Freq: Every day | ORAL | Status: DC
  Administered 2016-09-01 – 2016-09-05 (×5): 40 mg via ORAL
  Filled 2016-09-01 (×6): qty 1

## 2016-09-01 MED ORDER — GABAPENTIN 300 MG PO CAPS
300.0000 mg | ORAL_CAPSULE | ORAL | Status: AC
  Administered 2016-09-01: 300 mg via ORAL
  Filled 2016-09-01: qty 1

## 2016-09-01 MED ORDER — MIDAZOLAM HCL 2 MG/2ML IJ SOLN
INTRAMUSCULAR | Status: AC
  Filled 2016-09-01: qty 2

## 2016-09-01 MED ORDER — FENTANYL CITRATE (PF) 250 MCG/5ML IJ SOLN
INTRAMUSCULAR | Status: DC | PRN
  Administered 2016-09-01 (×7): 50 ug via INTRAVENOUS

## 2016-09-01 MED ORDER — ENOXAPARIN SODIUM 40 MG/0.4ML ~~LOC~~ SOLN
40.0000 mg | SUBCUTANEOUS | Status: DC
  Administered 2016-09-02 – 2016-09-05 (×4): 40 mg via SUBCUTANEOUS
  Filled 2016-09-01 (×4): qty 0.4

## 2016-09-01 MED ORDER — FENTANYL CITRATE (PF) 100 MCG/2ML IJ SOLN
INTRAMUSCULAR | Status: DC | PRN
  Administered 2016-09-01: 50 ug via INTRAVENOUS
  Administered 2016-09-01 (×2): 25 ug via INTRAVENOUS
  Administered 2016-09-01 (×2): 50 ug via INTRAVENOUS

## 2016-09-01 MED ORDER — FENTANYL CITRATE (PF) 250 MCG/5ML IJ SOLN
INTRAMUSCULAR | Status: AC
  Filled 2016-09-01: qty 5

## 2016-09-01 MED ORDER — CEFOTETAN DISODIUM 2 G IJ SOLR
2.0000 g | INTRAMUSCULAR | Status: AC
  Administered 2016-09-01: 2 g via INTRAVENOUS
  Filled 2016-09-01: qty 2

## 2016-09-01 MED ORDER — DEXTROSE 5 % IV SOLN
2.0000 g | Freq: Two times a day (BID) | INTRAVENOUS | Status: AC
  Administered 2016-09-01: 2 g via INTRAVENOUS
  Filled 2016-09-01: qty 2

## 2016-09-01 MED ORDER — HYDROMORPHONE HCL 1 MG/ML IJ SOLN
INTRAMUSCULAR | Status: AC
Start: 2016-09-01 — End: 2016-09-01
  Administered 2016-09-01: 0.5 mg via INTRAVENOUS
  Filled 2016-09-01: qty 1

## 2016-09-01 MED ORDER — HEPARIN SODIUM (PORCINE) 5000 UNIT/ML IJ SOLN
5000.0000 [IU] | Freq: Once | INTRAMUSCULAR | Status: AC
  Administered 2016-09-01: 5000 [IU] via SUBCUTANEOUS
  Filled 2016-09-01: qty 1

## 2016-09-01 MED ORDER — ROCURONIUM BROMIDE 50 MG/5ML IV SOSY
PREFILLED_SYRINGE | INTRAVENOUS | Status: AC
Start: 2016-09-01 — End: 2016-09-01
  Filled 2016-09-01: qty 5

## 2016-09-01 MED ORDER — NITROFURANTOIN MACROCRYSTAL 100 MG PO CAPS
100.0000 mg | ORAL_CAPSULE | Freq: Every day | ORAL | Status: DC
  Administered 2016-09-01 – 2016-09-04 (×4): 100 mg via ORAL
  Filled 2016-09-01 (×5): qty 1

## 2016-09-01 SURGICAL SUPPLY — 54 items
ADH SKN CLS APL DERMABOND .7 (GAUZE/BANDAGES/DRESSINGS) ×1
BLADE EXTENDED COATED 6.5IN (ELECTRODE) ×1 IMPLANT
CELLS DAT CNTRL 66122 CELL SVR (MISCELLANEOUS) IMPLANT
CHLORAPREP W/TINT 26ML (MISCELLANEOUS) ×2 IMPLANT
COUNTER NEEDLE 20 DBL MAG RED (NEEDLE) ×2 IMPLANT
COVER MAYO STAND STRL (DRAPES) ×4 IMPLANT
COVER SURGICAL LIGHT HANDLE (MISCELLANEOUS) ×4 IMPLANT
DERMABOND ADVANCED (GAUZE/BANDAGES/DRESSINGS) ×1
DERMABOND ADVANCED .7 DNX12 (GAUZE/BANDAGES/DRESSINGS) ×1 IMPLANT
DRAIN CHANNEL 19F RND (DRAIN) ×1 IMPLANT
DRAPE LAPAROSCOPIC ABDOMINAL (DRAPES) ×2 IMPLANT
DRSG OPSITE POSTOP 4X10 (GAUZE/BANDAGES/DRESSINGS) IMPLANT
DRSG OPSITE POSTOP 4X6 (GAUZE/BANDAGES/DRESSINGS) IMPLANT
DRSG OPSITE POSTOP 4X8 (GAUZE/BANDAGES/DRESSINGS) ×1 IMPLANT
DRSG PAD ABDOMINAL 8X10 ST (GAUZE/BANDAGES/DRESSINGS) ×1 IMPLANT
ELECT BLADE TIP CTD 4 INCH (ELECTRODE) ×1 IMPLANT
ELECT PENCIL ROCKER SW 15FT (MISCELLANEOUS) ×4 IMPLANT
EVACUATOR SILICONE 100CC (DRAIN) ×1 IMPLANT
GAUZE SPONGE 4X4 12PLY STRL (GAUZE/BANDAGES/DRESSINGS) ×1 IMPLANT
GLOVE BIO SURGEON STRL SZ 6.5 (GLOVE) ×4 IMPLANT
GLOVE BIOGEL PI IND STRL 7.0 (GLOVE) ×2 IMPLANT
GLOVE BIOGEL PI INDICATOR 7.0 (GLOVE) ×2
GOWN STRL REUS W/TWL 2XL LVL3 (GOWN DISPOSABLE) ×4 IMPLANT
GOWN STRL REUS W/TWL XL LVL3 (GOWN DISPOSABLE) ×8 IMPLANT
LEGGING LITHOTOMY PAIR STRL (DRAPES) ×2 IMPLANT
LUBRICANT JELLY K Y 4OZ (MISCELLANEOUS) ×2 IMPLANT
PACK COLON (CUSTOM PROCEDURE TRAY) ×2 IMPLANT
PACK GENERAL/GYN (CUSTOM PROCEDURE TRAY) ×2 IMPLANT
PAD POSITIONING PINK XL (MISCELLANEOUS) ×2 IMPLANT
RETRACTOR WND ALEXIS 18 MED (MISCELLANEOUS) IMPLANT
RTRCTR WOUND ALEXIS 18CM MED (MISCELLANEOUS)
SEALER TISSUE G2 CVD JAW 35 (ENDOMECHANICALS) IMPLANT
SEALER TISSUE G2 CVD JAW 45CM (ENDOMECHANICALS)
SEALER TISSUE X1 CVD JAW (INSTRUMENTS) IMPLANT
STAPLER PROXIMATE 75MM BLUE (STAPLE) ×1 IMPLANT
STAPLER VISISTAT 35W (STAPLE) ×2 IMPLANT
SUT ETHILON 2 0 PS N (SUTURE) ×1 IMPLANT
SUT NOVA 1 T20/GS 25DT (SUTURE) ×3 IMPLANT
SUT NOVA NAB DX-16 0-1 5-0 T12 (SUTURE) ×4 IMPLANT
SUT PDS AB 1 CTX 36 (SUTURE) IMPLANT
SUT PDS AB 1 TP1 96 (SUTURE) IMPLANT
SUT PROLENE 2 0 KS (SUTURE) ×2 IMPLANT
SUT SILK 2 0 (SUTURE) ×4
SUT SILK 2 0 SH CR/8 (SUTURE) ×2 IMPLANT
SUT SILK 2-0 18XBRD TIE 12 (SUTURE) ×1 IMPLANT
SUT SILK 3 0 (SUTURE) ×2
SUT SILK 3 0 SH CR/8 (SUTURE) ×2 IMPLANT
SUT SILK 3-0 18XBRD TIE 12 (SUTURE) ×1 IMPLANT
SUT VIC AB 2-0 SH 18 (SUTURE) ×7 IMPLANT
SUT VIC AB 2-0 SH 27 (SUTURE) ×4
SUT VIC AB 2-0 SH 27X BRD (SUTURE) IMPLANT
SUT VIC AB 4-0 PS2 27 (SUTURE) ×2 IMPLANT
TOWEL OR NON WOVEN STRL DISP B (DISPOSABLE) ×2 IMPLANT
TRAY FOLEY CATH 14FRSI W/METER (CATHETERS) ×1 IMPLANT

## 2016-09-01 NOTE — H&P (View-Only) (Signed)
AVIEL VITTONE 08/17/2016 1:36 PM Location: [facility] Patient #: [id number] DOB: 02-15-1951 Divorced / Language: Cleophus Molt / Race: White Female  History of Present Illness Leighton Ruff MD; Q000111Q 2:08 PM) The patient is a 66 year old female who presents with anal pain. 66 year old female patient of Dr. Donne Hazel, who has treated her in the past for a left-sided breast cancer. Patient has stage IV disease with metastases noted to her lung, cervical lymph nodes and axillary lymph nodes. During a f/u PET scan, a lesion was noted in her colon. She has also noticed some rectal bleeding and rectal pain. She underwent a colonoscopy, which showed a distal rectal mass. 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 CT scan showed no signs of metastatic disease. She underwent chemotherapy and radiation treatment. This was completed at the end of August 2017. I saw her back on October 31 and she had some edema in the area and signs of regression. Since that time she has noticed worsening pain and bleeding. She is now taking 42mcg fentanyl and a daily basis.   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 Spinal Surgery - Neck Cataract Surgery Right. Breast Biopsy Left.  Diagnostic Studies History Leighton Ruff, MD; Q000111Q 2:04 PM) Mammogram 1-3 years ago Colonoscopy never Pap Smear 1-5 years ago  Allergies Malachy Moan, RMA; 08/17/2016 1:36 PM) Ciprofloxacin *CHEMICALS*  Medication History Malachy Moan, RMA; 08/17/2016 1:36 PM) Illusions AA Breast Prosthesis (1 (one) Misc as needed, Taken starting 07/24/2014) Active. (C50.919 Left Mastectomy ; ; [id number]  Post-Mastectomy Camisole-2; ; L8000-Post-Surgical Bras-6; ; [id number]- Non-Silicone Breast Prosthesis-2; ; [id number]- Silicone Breast Prosthesis-1) Metoprolol Succinate ER (25MG  Tablet ER 24HR, Oral) Active. Escitalopram Oxalate (10MG  Tablet, Oral) Active. Mirtazapine (30MG  Tablet, Oral) Active. FentaNYL (12MCG/HR Patch 72HR, Transdermal) Active. Oscal 500/200 D-3 (500-200MG -UNIT Tablet, Oral) Active. Sominex (25MG  Tablet, Oral) Active. Zofran (4MG  Tablet, Oral as needed) Active. Nitrofurantoin Macrocrystal (100MG  Capsule, Oral) Active. MAGnesium-Oxide (400 (241.3 Mg)MG Tablet, Oral) Active. Omeprazole (20MG  Capsule DR, Oral) Active. Acetyl L-Carnitine (500MG  Capsule, Oral) Active. Medications Reconciled  Social History Leighton Ruff, MD; Q000111Q 2:04 PM) Alcohol use Moderate alcohol use. No drug use Caffeine use Tea. Tobacco use Never smoker.  Family History Leighton Ruff, MD; Q000111Q 2:04 PM) Arthritis Mother. Heart Disease Father. Depression Mother. Migraine Headache Mother. Hypertension Mother.  Pregnancy / Birth History Leighton Ruff, MD; Q000111Q 2:04 PM) Durenda Age 2 Age at menarche 71 years. Contraceptive History Oral contraceptives. Age of menopause 35-55  Other Problems Leighton Ruff, MD; Q000111Q 2:08 PM) Migraine Headache Lump In Breast Heart murmur Gastroesophageal Reflux Disease High blood pressure Hemorrhoids Bladder Problems Back Pain Depression Breast Cancer Anxiety Disorder     Review of Systems Leighton Ruff MD; Q000111Q 2:04 PM) General Present- Fatigue. Not Present- Appetite Loss, Chills, Fever, Night Sweats, Weight Gain and Weight Loss. Skin Not Present- Change in Wart/Mole, Dryness, Hives, Jaundice, New Lesions, Non-Healing Wounds, Rash and Ulcer. HEENT Present- Seasonal Allergies and Wears glasses/contact lenses. Not Present- Earache, Hearing Loss, Hoarseness, Nose Bleed, Oral Ulcers, Ringing in the Ears,  Sinus Pain, Sore Throat, Visual Disturbances and Yellow Eyes. Respiratory Present- Chronic Cough, Difficulty Breathing, Snoring and Wheezing. Not Present- Bloody sputum. Breast Not Present- Breast Mass, Breast Pain, Nipple Discharge and Skin Changes. Cardiovascular Present- Leg  Cramps and Shortness of Breath. Not Present- Chest Pain, Difficulty Breathing Lying Down, Palpitations, Rapid Heart Rate and Swelling of Extremities. Gastrointestinal Present- Hemorrhoids and Indigestion. Not Present- Abdominal Pain, Bloating, Bloody Stool, Change in Bowel Habits, Chronic diarrhea, Constipation, Difficulty Swallowing, Excessive gas, Gets full quickly at meals, Nausea, Rectal Pain and Vomiting. Female Genitourinary Not Present- Frequency, Nocturia, Painful Urination, Pelvic Pain and Urgency. Musculoskeletal Present- Muscle Weakness. Not Present- Back Pain, Joint Pain, Joint Stiffness, Muscle Pain and Swelling of Extremities. Neurological Present- Tremor. Not Present- Decreased Memory, Fainting, Headaches, Numbness, Seizures, Tingling, Trouble walking and Weakness. Psychiatric Present- Anxiety. Not Present- Bipolar, Change in Sleep Pattern, Depression, Fearful and Frequent crying. Endocrine Present- Heat Intolerance. Not Present- Cold Intolerance, Excessive Hunger, Hair Changes, Hot flashes and New Diabetes. Hematology Not Present- Easy Bruising, Excessive bleeding, Gland problems, HIV and Persistent Infections.  Vitals Malachy Moan RMA; 08/17/2016 1:37 PM) 08/17/2016 1:37 PM Weight: 138.6 lb Height: 64in Body Surface Area: 1.67 m Body Mass Index: 23.79 kg/m  Temp.: 63F  Pulse: 67 (Regular)  BP: 120/70 (Sitting, Left Arm, Standard)      Physical Exam Leighton Ruff MD; Q000111Q 2:04 PM)  General Mental Status-Alert. General Appearance-Not in acute distress. Build & Nutrition-Well nourished. Posture-Normal posture. Gait-Normal.  Head and Neck Head-normocephalic,  atraumatic with no lesions or palpable masses. Trachea-midline.  Chest and Lung Exam Chest and lung exam reveals -on auscultation, normal breath sounds, no adventitious sounds and normal vocal resonance.  Cardiovascular Cardiovascular examination reveals -normal heart sounds, regular rate and rhythm with no murmurs.  Abdomen Inspection Inspection of the abdomen reveals - No Hernias. Palpation/Percussion Palpation and Percussion of the abdomen reveal - Soft, Non Tender, No Rigidity (guarding), No hepatosplenomegaly and No Palpable abdominal masses.  Rectal Anorectal Exam External - normal external exam. Internal - Note: Approximately 4-6 cm palpable ulcerative mass at posterior midline of the anal canal arising around the level of the dentate line and extending into distal rectum.  Neurologic Neurologic evaluation reveals -alert and oriented x 3 with no impairment of recent or remote memory, normal attention span and ability to concentrate, normal sensation and normal coordination.  Musculoskeletal Normal Exam - Bilateral-Upper Extremity Strength Normal and Lower Extremity Strength Normal.    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 She states that she is having progressively worsening pain and bleeding. On exam, she has an ulcerative mass consistent with recurrence at posterior midline overlying the coccyx. This is confirmed with metabolic activity noted on recent PET scan. I discussed abdominal perineal resection with the patient. We will plan on doing this open to expedite scheduling. The surgery and anatomy were described to the patient as well as the risks of surgery and the possible complications. These include: Bleeding, deep abdominal infections and possible wound complications such as hernia  and infection, damage to adjacent structures, leak of surgical connections, which can lead to other surgeries and possibly an ostomy, possible need for other procedures, such as abscess drains in radiology, possible prolonged hospital stay, possible diarrhea from removal of part of the colon, possible constipation from narcotics, possible bowel, bladder or sexual dysfunction if having rectal surgery, prolonged fatigue/weakness or appetite loss, possible early recurrence of of disease, possible complications of their medical problems such as heart disease or arrhythmias or lung problems, death (less than 1%). I believe  the patient understands and wishes to proceed with the surgery.

## 2016-09-01 NOTE — Progress Notes (Signed)
Clarified Fentanyl patch order with Dr. Lucia Gaskins via phone. Md made aware pt applied a new 75 mcg patch yesterday but 25 mcg ordered post op. Dr. Lucia Gaskins instructed to leave 75 mcg patch and to clarify order with Dr. Marcello Moores in am.

## 2016-09-01 NOTE — Transfer of Care (Signed)
Immediate Anesthesia Transfer of Care Note  Patient: [name]  Procedure(s) Performed: Procedure(s): ABDOMINAL PERINEAL RESECTION PERMANENT COLOSTOMY (N/A)  Patient Location: PACU  Anesthesia Type:General  Level of Consciousness: awake and alert   Airway & Oxygen Therapy: Patient Spontanous Breathing and Patient connected to face mask oxygen  Post-op Assessment: Report given to RN and Post -op Vital signs reviewed and stable  Post vital signs: Reviewed and stable  Last Vitals:  Vitals:   09/01/16 1100  BP: 134/63  Pulse: 81  Resp: 16  Temp: 37.2 C    Last Pain:  Vitals:   09/01/16 1124  TempSrc:   PainSc: 4       Patients Stated Pain Goal: 3 ([id number])  Complications: No apparent anesthesia complications

## 2016-09-01 NOTE — Op Note (Signed)
09/01/2016  3:17 PM  PATIENT:  [name]  66 y.o. female  Patient Care Team: Celene Squibb, MD as PCP - General (Internal Medicine)  PRE-OPERATIVE DIAGNOSIS:  recurrent anal cancer  POST-OPERATIVE DIAGNOSIS:  recurrent anal cancer  PROCEDURE:   ABDOMINAL PERINEAL RESECTION  Anal EUA with biopsy  SURGEON:  Surgeon(s): Leighton Ruff, MD Alphonsa Overall, MD  ASSISTANT: Dr Lucia Gaskins   ANESTHESIA:   general  EBL:  Total I/O In: 2000 [I.V.:2000] Out: 440 [Urine:140; Blood:300]  DRAINS: (19F) Jackson-Pratt drain(s) with closed bulb suction in the pelvis   SPECIMEN:  Source of Specimen:  rectum and anus  DISPOSITION OF SPECIMEN:  PATHOLOGY  COUNTS:  YES  PLAN OF CARE: Admit to inpatient   PATIENT DISPOSITION:  PACU - hemodynamically stable.  INDICATION: 66 y.o. F with recurrent SCC of the rectum   OR FINDINGS: posterior distal rectal tumor  DESCRIPTION: the patient was identified in the preoperative holding area and taken to the OR where they were laid supine on the operating room table.  General anesthesia was induced without difficulty. SCDs were also noted to be in place prior to the initiation of anesthesia.  The patient was then placed in lithotomy position and a Foley catheter was inserted under sterile conditions. A surgical timeout was performed and an anal exam under anesthesia was completed. A biopsy of the mass was taken to confirm squamous cell carcinoma. Frozen biopsy results were consistent with this. The patient was then prepped and draped in the usual sterile fashion.   A surgical timeout was performed indicating the correct patient, procedure, positioning and need for preoperative antibiotics.   I began by making a lower midline incision using a 10 blade scalpel. This was carried down through the subcutaneous tissues using electrocautery. The fascia was entered at midline. The peritoneum was then entered bluntly and divided. A Balfour retractor was then placed,  the small bowel was packed out of the way and the sigmoid colon was mobilized off of the left lateral sidewall using electrocautery and blunt dissection. The left ureter was identified in the standard position and swept laterally. Once the sigmoid colon was mobilized out of the abdomen I divided this with a GIA blue load 75 mm stapler. The mesentery was then divided using 2-0 silk suture ligatures. This was then packed out of the way. I continued down the posterior plane until I entered the mesorectal space. I dissected down this space posteriorly to the level of the coccyx. I then came around laterally using electrocautery and blunt dissection. I divided the remaining peritoneum on the right and left side and connected this at the peritoneal reflection anteriorly. I used a [employer] retractor to gain exposure and continued to mobilize the lateral stalks and divided using electrocautery. There was some posterior adhesions to the coccyx which were resected away from the pelvic floor using electrocautery. One portion of this remained separate from the mesorectum specimen and this was removed separately and sent as additional posterior margin. After I mobilized down to the pelvic floor I divided the pelvic floor out laterally making sure not to cone in the specimen. As soon as I had dissected down through the pelvic floor circumferentially, I moved to the perineal portion of the procedure. I made an incision around the anal canal using electrocautery. Dissection was carried around the anal canal using electrocautery as well. I entered into the peritoneal space using the left coronary posteriorly I then came around the lateral planes of  my previously dissected pelvic floor. I then brought the specimen out through the peritoneum and continued to divide the anterior portion separating the anterior rectum from the posterior vaginal wall. After this was completed the specimen was sent to pathology for further examination.  I palpated the pelvic floor and there was no signs of residual disease. I began to close the peritoneal floor in layers using interrupted 2-0 Vicryl sutures. At this time I partner, Dr. Lucia Gaskins closed the peritoneal reflection using interrupted 2-0 Vicryl sutures. A 19 [place] Blake drain was placed in the pelvis and brought out through the left lower quadrant. This was secured into place with a 2-0 nylon suture. A Balfour retractor was removed. The pack sponges were removed from the abdomen as well. I then changed my gloves and gown and switched to the abdominal portion. I made an incision over the previously marked ostomy site using electrocautery. Dissection was carried down to level of the fascia. The fascia was incised in a cruciate manner and the rectum was split with a Kelly clamp. The peritoneum was then entered using electrocautery. The ostomy site was dilated to 2 fingerbreadths. The remaining sigmoid colon was brought out through the ostomy site. The abdomen was then irrigated. Hemostasis was good. The omentum was brought down over the small bowel.  The fascia was then close using interrupted 0 Novafil sutures. The subcutaneous layer was closed using interrupted 2-0 Vicryl sutures. The skin was closed using a 4-0 Vicryl suture. A sterile dressing was applied. I then removed the staple line of the sigmoid colon using electric cautery. I matured the ostomy in standard [place] fashion using 2-0 Vicryl interrupted sutures. An ostomy appliance was placed. The patient was then awakened from anesthesia and sent to the postanesthesia care unit in stable condition. All counts were correct per operating room staff.

## 2016-09-01 NOTE — Anesthesia Postprocedure Evaluation (Signed)
Anesthesia Post Note  Patient: [name]  Procedure(s) Performed: Procedure(s) (LRB): ABDOMINAL PERINEAL RESECTION PERMANENT COLOSTOMY (N/A)  Patient location during evaluation: PACU Anesthesia Type: General Level of consciousness: awake and alert, oriented and patient cooperative Pain management: pain level controlled Vital Signs Assessment: post-procedure vital signs reviewed and stable Respiratory status: spontaneous breathing, nonlabored ventilation, respiratory function stable and patient connected to nasal cannula oxygen Cardiovascular status: blood pressure returned to baseline and stable Postop Assessment: no signs of nausea or vomiting Anesthetic complications: no       Last Vitals:  Vitals:   09/01/16 1815 09/01/16 1915  BP: (!) 156/73 140/77  Pulse: 74 77  Resp: 11 19  Temp: 36.6 C 36.7 C    Last Pain:  Vitals:   09/01/16 1915  TempSrc: Oral  PainSc:                  Latravion Graves,E. Evalyse Stroope

## 2016-09-01 NOTE — Progress Notes (Signed)
Patient brought her own Olaparib 150 mg as per presurgical testing instructions. Taken to pharmacy for it to be dispensed to patient.   Patient has skin marking on LLQ of abdomen for colostomy placement.

## 2016-09-01 NOTE — Anesthesia Preprocedure Evaluation (Signed)
Anesthesia Evaluation  Patient identified by MRN, date of birth, ID band Patient awake    Reviewed: Allergy & Precautions, NPO status , Patient's Chart, lab work & pertinent test results  History of Anesthesia Complications Negative for: history of anesthetic complications  Airway Mallampati: II  TM Distance: >3 FB Neck ROM: Full    Dental  (+) Teeth Intact   Pulmonary neg shortness of breath, neg sleep apnea, neg COPD, neg recent URI,    breath sounds clear to auscultation       Cardiovascular hypertension, Pt. on medications (-) angina+ CAD  (-) CHF (-) dysrhythmias  Rhythm:Regular     Neuro/Psych PSYCHIATRIC DISORDERS Depression  Neuromuscular disease    GI/Hepatic Neg liver ROS, GERD  Medicated and Controlled,Anal cancer   Endo/Other  negative endocrine ROS  Renal/GU negative Renal ROS     Musculoskeletal   Abdominal   Peds  Hematology  (+) anemia ,   Anesthesia Other Findings   Reproductive/Obstetrics                             Anesthesia Physical Anesthesia Plan  ASA: III  Anesthesia Plan: General   Post-op Pain Management:    Induction: Intravenous  Airway Management Planned: Oral ETT  Additional Equipment: None  Intra-op Plan:   Post-operative Plan: Extubation in OR  Informed Consent: I have reviewed the patients History and Physical, chart, labs and discussed the procedure including the risks, benefits and alternatives for the proposed anesthesia with the patient or authorized representative who has indicated his/her understanding and acceptance.   Dental advisory given  Plan Discussed with: CRNA and Surgeon  Anesthesia Plan Comments:         Anesthesia Quick Evaluation

## 2016-09-01 NOTE — Interval H&P Note (Signed)
History and Physical Interval Note:  09/01/2016 12:06 PM  [name]  has presented today for surgery, with the diagnosis of recurrent anal cancer  The various methods of treatment have been discussed with the patient and family. After consideration of risks, benefits and other options for treatment, the patient has consented to  Procedure(s): ABDOMINAL PERINEAL RESECTION PERMANENT COLOSTOMY (N/A) as a surgical intervention .  The patient's history has been reviewed, patient examined, no change in status, stable for surgery.  I have reviewed the patient's chart and labs.  Questions were answered to the patient's satisfaction.     Rosario Adie, MD  [facility]

## 2016-09-01 NOTE — Anesthesia Procedure Notes (Signed)
Procedure Name: Intubation Date/Time: 09/01/2016 12:34 PM Performed by: Dione Booze Pre-anesthesia Checklist: Emergency Drugs available, Suction available, Patient being monitored and Patient identified Patient Re-evaluated:Patient Re-evaluated prior to inductionOxygen Delivery Method: Circle system utilized Preoxygenation: Pre-oxygenation with 100% oxygen Laryngoscope Size: Mac and 4 Grade View: Grade II Tube type: Oral Tube size: 7.5 mm Number of attempts: 1 Airway Equipment and Method: Stylet Placement Confirmation: ETT inserted through vocal cords under direct vision,  positive ETCO2 and breath sounds checked- equal and bilateral Secured at: 22 cm Tube secured with: Tape Dental Injury: Teeth and Oropharynx as per pre-operative assessment

## 2016-09-02 LAB — CBC
HCT: 29.8 % — ABNORMAL LOW (ref 36.0–46.0)
Hemoglobin: 10.5 g/dL — ABNORMAL LOW (ref 12.0–15.0)
MCH: 36.5 pg — ABNORMAL HIGH (ref 26.0–34.0)
MCHC: 35.2 g/dL (ref 30.0–36.0)
MCV: 103.5 fL — ABNORMAL HIGH (ref 78.0–100.0)
Platelets: 201 10*3/uL (ref 150–400)
RBC: 2.88 MIL/uL — ABNORMAL LOW (ref 3.87–5.11)
RDW: 13.3 % (ref 11.5–15.5)
WBC: 15.1 10*3/uL — ABNORMAL HIGH (ref 4.0–10.5)

## 2016-09-02 LAB — BASIC METABOLIC PANEL
Anion gap: 10 (ref 5–15)
BUN: 10 mg/dL (ref 6–20)
CO2: 21 mmol/L — ABNORMAL LOW (ref 22–32)
Calcium: 7.9 mg/dL — ABNORMAL LOW (ref 8.9–10.3)
Chloride: 104 mmol/L (ref 101–111)
Creatinine, Ser: 0.93 mg/dL (ref 0.44–1.00)
GFR calc Af Amer: >60 mL/min (ref >60)
GFR calc non Af Amer: >60 mL/min (ref >60)
Glucose, Bld: 119 mg/dL — ABNORMAL HIGH (ref 65–99)
Potassium: 4.2 mmol/L (ref 3.5–5.1)
Sodium: 135 mmol/L (ref 135–145)

## 2016-09-02 LAB — POCT I-STAT 4, (NA,K, GLUC, HGB,HCT)
Glucose, Bld: 135 mg/dL — ABNORMAL HIGH (ref 65–99)
HCT: 24 % — ABNORMAL LOW (ref 36.0–46.0)
Hemoglobin: 8.2 g/dL — ABNORMAL LOW (ref 12.0–15.0)
Potassium: 3.7 mmol/L (ref 3.5–5.1)
Sodium: 137 mmol/L (ref 135–145)

## 2016-09-02 MED ORDER — KCL IN DEXTROSE-NACL 20-5-0.45 MEQ/L-%-% IV SOLN
INTRAVENOUS | Status: DC
  Administered 2016-09-02 (×2): via INTRAVENOUS
  Administered 2016-09-03: 10 mL/h via INTRAVENOUS
  Filled 2016-09-02 (×4): qty 1000

## 2016-09-02 MED ORDER — MORPHINE SULFATE (PF) 2 MG/ML IV SOLN
2.0000 mg | INTRAVENOUS | Status: DC | PRN
  Administered 2016-09-02: 4 mg via INTRAVENOUS
  Administered 2016-09-02: 2 mg via INTRAVENOUS
  Administered 2016-09-03 (×2): 4 mg via INTRAVENOUS
  Filled 2016-09-02 (×2): qty 2
  Filled 2016-09-02: qty 1

## 2016-09-02 MED ORDER — FENTANYL 75 MCG/HR TD PT72
75.0000 ug | MEDICATED_PATCH | TRANSDERMAL | Status: DC
  Administered 2016-09-03: 75 ug via TRANSDERMAL
  Filled 2016-09-02 (×2): qty 1

## 2016-09-02 NOTE — Progress Notes (Signed)
PCA morphine was D/C'd this morning. I wasted 19mg  with Grace Blight at 0945 today in the sink. Malcolm

## 2016-09-02 NOTE — Progress Notes (Signed)
1 Day Post-Op APR Subjective: Pt doing well.  Pain controlled.  No nausea.  Annoyed with PCA  Objective: Vital signs in last 24 hours: Temp:  [97.3 F (36.3 C)-99 F (37.2 C)] 97.3 F (36.3 C) (01/24 0537) Pulse Rate:  [67-81] 69 (01/24 0537) Resp:  [11-20] 17 (01/24 0833) BP: (111-157)/(53-91) 116/53 (01/24 0537) SpO2:  [92 %-100 %] 100 % (01/24 0833) FiO2 (%):  [2 %] 2 % (01/23 2119) Weight:  [61.7 kg (136 lb)] 61.7 kg (136 lb) (01/23 1124)   Intake/Output from previous day: 01/23 0701 - 01/24 0700 In: 3800 [I.V.:3350; IV Piggyback:250] Out: 2065 [Urine:1505; Drains:260; Blood:300] Intake/Output this shift: No intake/output data recorded.   General appearance: alert and cooperative GI: soft, nondistended  Incision: no significant drainage  Lab Results:   Recent Labs  09/01/16 1501 09/02/16 0508  WBC  --  15.1*  HGB 8.2* 10.5*  HCT 24.0* 29.8*  PLT  --  201   BMET  Recent Labs  09/01/16 1501 09/02/16 0508  NA 137 135  K 3.7 4.2  CL  --  104  CO2  --  21*  GLUCOSE 135* 119*  BUN  --  10  CREATININE  --  0.93  CALCIUM  --  7.9*   PT/INR No results for input(s): LABPROT, INR in the last 72 hours. ABG No results for input(s): PHART, HCO3 in the last 72 hours.  Invalid input(s): PCO2, PO2  MEDS, Scheduled . acetaminophen  1,000 mg Oral Q6H  . alvimopan  12 mg Oral BID  . chlorhexidine  15 mL Mouth Rinse BID  . enoxaparin (LOVENOX) injection  40 mg Subcutaneous Q24H  . [START ON 09/03/2016] fentaNYL  75 mcg Transdermal Q72H  . mouth rinse  15 mL Mouth Rinse q12n4p  . metoprolol succinate  25 mg Oral Daily  . mirtazapine  30 mg Oral QHS  . morphine   Intravenous Q4H  . nitrofurantoin  100 mg Oral QHS  . Olaparib  300 mg Oral BID  . pantoprazole  40 mg Oral Daily    Studies/Results: No results found.  Assessment: s/p Procedure(s): ABDOMINAL PERINEAL RESECTION PERMANENT COLOSTOMY Patient Active Problem List   Diagnosis Date Noted  . Anal  cancer ([place]) 09/01/2016  . Coronary artery calcification seen on CT scan 03/22/2016  . 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 Bone metastasis ([place]) 01/17/2016  . 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 BRCA2 positive 12/16/2015  . Port catheter in place 04/14/2013  . Adenocarcinoma of left breast 01/27/2011  . Osteoporosis 01/27/2011    Expected post op course  Plan: Advance diet to clears, full liquids later today if tolerates clears D/c PCA Ambulate Decrease IVF's   LOS: 1 day     .Rosario Adie, [facility], [place]   09/02/2016 9:09 AM

## 2016-09-02 NOTE — Evaluation (Signed)
Physical Therapy Evaluation Patient Details Name: [name] MRN: [id number] DOB: 09-Jun-1951 Today's Date: 09/02/2016   History of Present Illness  Pt admitted with recurrent anal CA and s/p abdominal perineal resection with permanent colostomy 09/01/16.  Pt with hx of Breast CA s/p mastectomy and with chemo related neuropathy  Clinical Impression  Pt admitted as above and presenting with functional mobility limitations 2* post op pain and mild ambulatory balance deficits.  Pt should progress to dc home with assist of family/friends.    Follow Up Recommendations No PT follow up    Equipment Recommendations  None recommended by PT    Recommendations for Other Services OT consult     Precautions / Restrictions Precautions Precautions: Fall Precaution Comments: JP drain in place Restrictions Weight Bearing Restrictions: No      Mobility  Bed Mobility Overal bed mobility: Needs Assistance Bed Mobility: Supine to Sit;Sit to Supine     Supine to sit: Min guard Sit to supine: Min assist   General bed mobility comments: pt utilizing bed rail to move to sitting and requiring min assist with LE back into bed.  Cues for log roll technique  Transfers Overall transfer level: Needs assistance Equipment used: None Transfers: Sit to/from Stand Sit to Stand: Min assist         General transfer comment: cues for use of UEs and min assist to bring wt up and fwd and to balance in initial standing  Ambulation/Gait Ambulation/Gait assistance: Min assist;Min guard Ambulation Distance (Feet): 800 Feet Assistive device: Rolling walker (2 wheeled) Gait Pattern/deviations: Step-through pattern;Shuffle     General Gait Details: min cues for posture and position from [employer]            Wheelchair Mobility    Modified Rankin (Stroke Patients Only)       Balance Overall balance assessment: Needs assistance Sitting-balance support: No upper extremity supported;Feet  supported Sitting balance-Leahy Scale: Good     Standing balance support: No upper extremity supported Standing balance-Leahy Scale: Fair                               Pertinent Vitals/Pain Pain Assessment: 0-10 Pain Score: 5  Pain Location: "my backside" Pain Descriptors / Indicators: Sore Pain Intervention(s): Limited activity within patient's tolerance;Monitored during session;Premedicated before session    Home Living Family/patient expects to be discharged to:: Private residence Living Arrangements: Alone Available Help at Discharge: Family;Friend(s);Available 24 hours/day;Available PRN/intermittently Type of Home: House Home Access: Stairs to enter Entrance Stairs-Rails: Right Entrance Stairs-Number of Steps: 7 Home Layout: One level Home Equipment: None Additional Comments: Pt states she can borrow any equipment she needs from family members`    Prior Function Level of Independence: Independent               Hand Dominance        Extremity/Trunk Assessment   Upper Extremity Assessment Upper Extremity Assessment: Overall WFL for tasks assessed    Lower Extremity Assessment Lower Extremity Assessment: Overall WFL for tasks assessed    Cervical / Trunk Assessment Cervical / Trunk Assessment: Normal  Communication   Communication: No difficulties  Cognition Arousal/Alertness: Awake/alert Behavior During Therapy: WFL for tasks assessed/performed Overall Cognitive Status: Within Functional Limits for tasks assessed                      General Comments      Exercises  Assessment/Plan    PT Assessment Patient needs continued PT services  PT Problem List Decreased balance;Decreased mobility;Decreased knowledge of use of DME;Pain          PT Treatment Interventions DME instruction;Gait training;Stair training;Functional mobility training;Therapeutic activities;Therapeutic exercise;Patient/family education    PT Goals  (Current goals can be found in the Care Plan section)  Acute Rehab PT Goals Patient Stated Goal: Regain IND and return home PT Goal Formulation: With patient Time For Goal Achievement: 09/16/16 Potential to Achieve Goals: Good    Frequency Min 3X/week   Barriers to discharge        Co-evaluation               End of Session   Activity Tolerance: Patient tolerated treatment well Patient left: in bed;with call bell/phone within reach;with family/visitor present Nurse Communication: Mobility status         Time: 1115-1140 PT Time Calculation (min) (ACUTE ONLY): 25 min   Charges:   PT Evaluation $PT Eval Low Complexity: 1 Procedure PT Treatments $Gait Training: 8-22 mins   PT G Codes:        Sotirios Navarro 2016-09-11, 12:34 PM

## 2016-09-02 NOTE — Consult Note (Signed)
[place] Nurse ostomy consult note Stoma type/location: LUQ Colostomy Stomal assessment/size: Not seen today Peristomal assessment: Not seen today Treatment options for stomal/peristomal skin: N/A until assessment Output: Small amount of serosanguinous drainage in pouch; no flatus, no stool Ostomy pouching: 1pc.post op pouch  Education provided: Introduced myself to patient and friend and patient declined visit at this time.  I left her with an educational booklet and she agrees to participate in a pouch change tomorrow morning. She is tearful. Enrolled patient in [facility] program: No WOC nursing team will follow along with you for education and ostomy pouch management, and will remain available to this patient, the nursing, surgical and medical teams.   Thanks, Maudie Flakes, MSN, RN, [place], Arther Abbott  Pager# [phone]

## 2016-09-03 LAB — BASIC METABOLIC PANEL
Anion gap: 5 (ref 5–15)
BUN: 7 mg/dL (ref 6–20)
CO2: 25 mmol/L (ref 22–32)
Calcium: 7.9 mg/dL — ABNORMAL LOW (ref 8.9–10.3)
Chloride: 111 mmol/L (ref 101–111)
Creatinine, Ser: 0.88 mg/dL (ref 0.44–1.00)
GFR calc Af Amer: >60 mL/min (ref >60)
GFR calc non Af Amer: >60 mL/min (ref >60)
Glucose, Bld: 113 mg/dL — ABNORMAL HIGH (ref 65–99)
Potassium: 3.5 mmol/L (ref 3.5–5.1)
Sodium: 141 mmol/L (ref 135–145)

## 2016-09-03 LAB — CBC
HCT: 32.2 % — ABNORMAL LOW (ref 36.0–46.0)
Hemoglobin: 10.7 g/dL — ABNORMAL LOW (ref 12.0–15.0)
MCH: 35 pg — ABNORMAL HIGH (ref 26.0–34.0)
MCHC: 33.2 g/dL (ref 30.0–36.0)
MCV: 105.2 fL — ABNORMAL HIGH (ref 78.0–100.0)
Platelets: 236 10*3/uL (ref 150–400)
RBC: 3.06 MIL/uL — ABNORMAL LOW (ref 3.87–5.11)
RDW: 13.6 % (ref 11.5–15.5)
WBC: 10.2 10*3/uL (ref 4.0–10.5)

## 2016-09-03 MED ORDER — SODIUM CHLORIDE 0.9% FLUSH
10.0000 mL | INTRAVENOUS | Status: DC | PRN
  Administered 2016-09-05: 10 mL
  Filled 2016-09-03: qty 40

## 2016-09-03 MED ORDER — HYDROCODONE-ACETAMINOPHEN 5-325 MG PO TABS
1.0000 | ORAL_TABLET | ORAL | Status: DC | PRN
  Administered 2016-09-03 (×3): 1 via ORAL
  Administered 2016-09-03: 2 via ORAL
  Administered 2016-09-04: 1 via ORAL
  Administered 2016-09-04: 2 via ORAL
  Administered 2016-09-04 – 2016-09-05 (×6): 1 via ORAL
  Administered 2016-09-05 (×2): 2 via ORAL
  Filled 2016-09-03: qty 1
  Filled 2016-09-03: qty 2
  Filled 2016-09-03: qty 1
  Filled 2016-09-03: qty 2
  Filled 2016-09-03: qty 1
  Filled 2016-09-03: qty 2
  Filled 2016-09-03 (×5): qty 1
  Filled 2016-09-03: qty 2
  Filled 2016-09-03: qty 1
  Filled 2016-09-03 (×2): qty 2

## 2016-09-03 NOTE — Consult Note (Signed)
[place] Nurse ostomy follow up Enrolled patient in [employer] Discharge program: Yes, today.  [place] nursing team will follow, and will remain available to this patient, the nursing, surgical and medical teams.   Thanks, Maudie Flakes, MSN, RN, [place], Arther Abbott  Pager# [phone]

## 2016-09-03 NOTE — Progress Notes (Signed)
Physical Therapy Treatment Patient Details Name: ARGELIA THUROW MRN: [id number] DOB: 03-31-1951 Today's Date: 09/03/2016    History of Present Illness Pt admitted with recurrent anal CA and s/p abdominal perineal resection with permanent colostomy 09/01/16.  Pt with hx of Breast CA s/p mastectomy and with chemo related neuropathy    PT Comments    Pt in good spirits.  Assisted with amb full unit using w RW just for safety for our long hallways.   Follow Up Recommendations  No PT follow up     Equipment Recommendations  None recommended by PT    Recommendations for Other Services       Precautions / Restrictions Precautions Precautions: Fall Precaution Comments: JP drain in place Restrictions Weight Bearing Restrictions: No    Mobility  Bed Mobility Overal bed mobility: Modified Independent             General bed mobility comments: increased time  Transfers Overall transfer level: Needs assistance Equipment used: None Transfers: Sit to/from Stand Sit to Stand: Supervision;Min guard         General transfer comment: cues for use of UEs and min assist to bring wt up and fwd and to balance in initial standing  Ambulation/Gait Ambulation/Gait assistance: Min guard Ambulation Distance (Feet): 425 Feet Assistive device: Rolling walker (2 wheeled) Gait Pattern/deviations: Step-through pattern;Shuffle     General Gait Details: increased time   [employer] Rankin (Stroke Patients Only)       Balance                                    Cognition Arousal/Alertness: Awake/alert Behavior During Therapy: WFL for tasks assessed/performed Overall Cognitive Status: Within Functional Limits for tasks assessed                      Exercises      General Comments        Pertinent Vitals/Pain Pain Assessment: Faces Faces Pain Scale: Hurts a little bit Pain Location: "my backside" Pain  Descriptors / Indicators: Sore    Home Living                      Prior Function            PT Goals (current goals can now be found in the care plan section) Progress towards PT goals: Progressing toward goals    Frequency    Min 3X/week      PT Plan Current plan remains appropriate    Co-evaluation             End of Session Equipment Utilized During Treatment: Gait belt Activity Tolerance: Patient tolerated treatment well Patient left: in bed;with call bell/phone within reach;with family/visitor present     Time: [id number] PT Time Calculation (min) (ACUTE ONLY): 12 min  Charges:  $Gait Training: 8-22 mins                    G Codes:      Rica Koyanagi  PTA [facility]  Acute  Rehab Pager      [phone]

## 2016-09-03 NOTE — Consult Note (Signed)
[place] [facility] Nurse ostomy consult note Stoma type/location: LUQ Colostomy Stomal assessment/size: 1 and 1/2 inches round, red with darker hue of red in the center near os, moist, edematous. Os at center. Peristomal assessment: Intact, clear.  Crease 0.5cm below ostomy (distal margin) Treatment options for stomal/peristomal skin: Skin barrier ring Output: flatus, small amount of serosanguinous effluent Ostomy pouching:2pc. 2 and 1/4 inch ostomy pouching system with skin barrier ring Education provided: Extended session for ostomy:  Review of the GI system, stoma and pouch characteristics.  Teaching regarding activity, patient is concerned that her (horse) riding pants will rub ostomy and is taught that not only will stoma decrease in size and elevation, but that we have many other interventions we can employ if that is the case. Demonstration of pouch removal, stoma sizing with stoma sizing guide and pouching system preparation.  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  We discuss that she will be able to obtain these pre-cut, but not until her stomal edema resolves.  We discuss that she might be able to engage a friend to assist, but she is not sure.  I demonstrate application of skin barrier ring, skin barrier (placed as a diamond today rather than square to avoid the surgical honeycomb dressing) and the Lock and Roll closure.  Patient is able to give a return demonstration of this skill, but is again concerned about neuropathy.  We discuss that she pay be able to place the tail end of the pouch on the bathroom counter and use a coffee mug or the heel of her hand to ensure closure. Other questions are answered about emptying frequency and change frequency; I demonstrate cleaning of the tail closure with a toilet paper "wick" and only cleaning out the bottom 2-inches of the pouch and she is understanding of that rationale.  Patient  tearfully relays how difficult the decade of cancer(s), the last month of intractable diarrhea and now this new ostomy has been, but notes that the ostomy appears to be an answer to the diarrhea problem. I will see her again tomorrow. Enrolled patient in [facility] program: No WOC nursing team will follow, and will remain available to this patient, the nursing, surgical and medical teams.   Thanks, Maudie Flakes, MSN, RN, [place], Arther Abbott  Pager# [phone]

## 2016-09-03 NOTE — Progress Notes (Signed)
2 Days Post-Op APR Subjective: Pt doing well.  Pain controlled.  No nausea.  Tolerated FLD.  Passing air through ostomy  Objective: Vital signs in last 24 hours: Temp:  [98.2 F (36.8 C)-98.8 F (37.1 C)] 98.2 F (36.8 C) (01/25 0527) Pulse Rate:  [70-86] 73 (01/25 0527) Resp:  [17-18] 18 (01/25 0527) BP: (114-148)/(44-63) 136/63 (01/25 0527) SpO2:  [98 %-100 %] 100 % (01/25 0527)   Intake/Output from previous day: 01/24 0701 - 01/25 0700 In: 2240 [P.O.:840; I.V.:1200] Out: 3832 [Urine:3350; Drains:250; Stool:25] Intake/Output this shift: No intake/output data recorded.   General appearance: alert and cooperative GI: soft, nondistended Ostomy: beefy red Incision: no significant drainage  Lab Results:   Recent Labs  09/02/16 0508 09/03/16 0541  WBC 15.1* 10.2  HGB 10.5* 10.7*  HCT 29.8* 32.2*  PLT 201 236   BMET  Recent Labs  09/02/16 0508 09/03/16 0541  NA 135 141  K 4.2 3.5  CL 104 111  CO2 21* 25  GLUCOSE 119* 113*  BUN 10 7  CREATININE 0.93 0.88  CALCIUM 7.9* 7.9*   PT/INR No results for input(s): LABPROT, INR in the last 72 hours. ABG No results for input(s): PHART, HCO3 in the last 72 hours.  Invalid input(s): PCO2, PO2  MEDS, Scheduled . alvimopan  12 mg Oral BID  . chlorhexidine  15 mL Mouth Rinse BID  . enoxaparin (LOVENOX) injection  40 mg Subcutaneous Q24H  . fentaNYL  75 mcg Transdermal Q72H  . mouth rinse  15 mL Mouth Rinse q12n4p  . metoprolol succinate  25 mg Oral Daily  . mirtazapine  30 mg Oral QHS  . nitrofurantoin  100 mg Oral QHS  . Olaparib  300 mg Oral BID  . pantoprazole  40 mg Oral Daily    Studies/Results: No results found.  Assessment: s/p Procedure(s): ABDOMINAL PERINEAL RESECTION PERMANENT COLOSTOMY Patient Active Problem List   Diagnosis Date Noted  . 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 Coronary artery calcification seen on CT scan 03/22/2016  . 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 Bone  metastasis ([place]) 01/17/2016  . 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 BRCA2 positive 12/16/2015  . Port catheter in place 04/14/2013  . Adenocarcinoma of left breast 01/27/2011  . Osteoporosis 01/27/2011    Expected post op course  Plan: Advance diet to soft foods KVO IVF's Ambulate PO pain meds   LOS: 2 days     .Rosario Adie, [facility], [place]   09/03/2016 7:39 AM

## 2016-09-04 LAB — BASIC METABOLIC PANEL
Anion gap: 4 — ABNORMAL LOW (ref 5–15)
BUN: <5 mg/dL — ABNORMAL LOW (ref 6–20)
CO2: 27 mmol/L (ref 22–32)
Calcium: 8.2 mg/dL — ABNORMAL LOW (ref 8.9–10.3)
Chloride: 111 mmol/L (ref 101–111)
Creatinine, Ser: 0.81 mg/dL (ref 0.44–1.00)
GFR calc Af Amer: >60 mL/min (ref >60)
GFR calc non Af Amer: >60 mL/min (ref >60)
Glucose, Bld: 113 mg/dL — ABNORMAL HIGH (ref 65–99)
Potassium: 4.2 mmol/L (ref 3.5–5.1)
Sodium: 142 mmol/L (ref 135–145)

## 2016-09-04 LAB — CBC
HCT: 27.7 % — ABNORMAL LOW (ref 36.0–46.0)
Hemoglobin: 9.3 g/dL — ABNORMAL LOW (ref 12.0–15.0)
MCH: 36 pg — ABNORMAL HIGH (ref 26.0–34.0)
MCHC: 33.6 g/dL (ref 30.0–36.0)
MCV: 107.4 fL — ABNORMAL HIGH (ref 78.0–100.0)
Platelets: 268 10*3/uL (ref 150–400)
RBC: 2.58 MIL/uL — ABNORMAL LOW (ref 3.87–5.11)
RDW: 13.9 % (ref 11.5–15.5)
WBC: 10.5 10*3/uL (ref 4.0–10.5)

## 2016-09-04 MED ORDER — SODIUM CHLORIDE 0.9 % IV SOLN: 250.0000 mL | INTRAVENOUS | Status: DC | PRN

## 2016-09-04 MED ORDER — DOCUSATE SODIUM 100 MG PO CAPS: 200.0000 mg | ORAL_CAPSULE | Freq: Two times a day (BID) | ORAL | 0 refills | Status: DC

## 2016-09-04 MED ORDER — DOCUSATE SODIUM 100 MG PO CAPS
200.0000 mg | ORAL_CAPSULE | Freq: Two times a day (BID) | ORAL | Status: DC
  Administered 2016-09-04 – 2016-09-05 (×3): 200 mg via ORAL
  Filled 2016-09-04 (×3): qty 2

## 2016-09-04 MED ORDER — SODIUM CHLORIDE 0.9% FLUSH
3.0000 mL | Freq: Two times a day (BID) | INTRAVENOUS | Status: DC
  Administered 2016-09-04: 3 mL via INTRAVENOUS

## 2016-09-04 MED ORDER — SODIUM CHLORIDE 0.9% FLUSH: 3.0000 mL | INTRAVENOUS | Status: DC | PRN

## 2016-09-04 NOTE — Progress Notes (Signed)
3 Days Post-Op APR Subjective: Pt doing well.  Pain controlled.  No nausea.  Tolerated soft diet.  Passing air and some stool through ostomy  Objective: Vital signs in last 24 hours: Temp:  [98.2 F (36.8 C)-99.3 F (37.4 C)] 98.4 F (36.9 C) (01/26 0622) Pulse Rate:  [70-91] 70 (01/26 0622) Resp:  [18] 18 (01/26 0622) BP: (133-144)/(61-72) 142/72 (01/26 0622) SpO2:  [98 %-100 %] 100 % (01/26 0622)   Intake/Output from previous day: 01/25 0701 - 01/26 0700 In: 1796.7 [P.O.:1650; I.V.:146.7] Out: 3116 [Urine:2975; Drains:122; Stool:19] Intake/Output this shift: No intake/output data recorded.   General appearance: alert and cooperative GI: soft, nondistended Ostomy: beefy red Incision: no significant drainage  Lab Results:   Recent Labs  09/03/16 0541 09/04/16 0416  WBC 10.2 10.5  HGB 10.7* 9.3*  HCT 32.2* 27.7*  PLT 236 268   BMET  Recent Labs  09/03/16 0541 09/04/16 0416  NA 141 142  K 3.5 4.2  CL 111 111  CO2 25 27  GLUCOSE 113* 113*  BUN 7 <5*  CREATININE 0.88 0.81  CALCIUM 7.9* 8.2*   PT/INR No results for input(s): LABPROT, INR in the last 72 hours. ABG No results for input(s): PHART, HCO3 in the last 72 hours.  Invalid input(s): PCO2, PO2  MEDS, Scheduled . alvimopan  12 mg Oral BID  . chlorhexidine  15 mL Mouth Rinse BID  . docusate sodium  200 mg Oral BID  . enoxaparin (LOVENOX) injection  40 mg Subcutaneous Q24H  . fentaNYL  75 mcg Transdermal Q72H  . mouth rinse  15 mL Mouth Rinse q12n4p  . metoprolol succinate  25 mg Oral Daily  . mirtazapine  30 mg Oral QHS  . nitrofurantoin  100 mg Oral QHS  . Olaparib  300 mg Oral BID  . pantoprazole  40 mg Oral Daily  . sodium chloride flush  3 mL Intravenous Q12H    Studies/Results: No results found.  Assessment: s/p Procedure(s): ABDOMINAL PERINEAL RESECTION PERMANENT COLOSTOMY Patient Active Problem List   Diagnosis Date Noted  . Anal cancer ([place]) 09/01/2016  . Coronary artery  calcification seen on CT scan 03/22/2016  . 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 Bone metastasis ([place]) 01/17/2016  . 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 BRCA2 positive 12/16/2015  . Port catheter in place 04/14/2013  . Adenocarcinoma of left breast 01/27/2011  . Osteoporosis 01/27/2011    Expected post op course  Plan: Reg diet Saline lock IVF's Ambulate PO pain meds   LOS: 3 days     .Rosario Adie, [facility], [place]   09/04/2016 8:03 AM

## 2016-09-04 NOTE — Progress Notes (Signed)
Patient demonstrated colostomy emptying, [id number] click pouch closure, and burping independently effectively. Questions asked regarding patient's concerns for caring for colostomy, patient stated that her only concern was her neuropathy affecting her emptying/closing her pouch properly and would like to inquire if a disposable bag be more effective. Neta Mends RN 09-04-2016 19:18pm

## 2016-09-04 NOTE — Progress Notes (Signed)
PT Cancellation Note  Patient Details Name: [name] MRN: [id number] DOB: 1950-10-08   Cancelled Treatment:     pt stated she just took pain meds then plans to amb with the nursing student at [facility]  PTA [facility]  Acute  Rehab Pager      [phone]

## 2016-09-04 NOTE — Discharge Instructions (Addendum)
ABDOMINAL SURGERY: POST OP INSTRUCTIONS  1. DIET: Follow a light bland diet the first 24 hours after arrival home, such as soup, liquids, crackers, etc.  Be sure to include lots of fluids daily.  Avoid fast food or heavy meals as your are more likely to get nauseated.  Do not eat any uncooked fruits or vegetables for the next 2 weeks as your colon heals. 2. Take your usually prescribed home medications unless otherwise directed. 3. PAIN CONTROL: a. Pain is best controlled by a usual combination of three different methods TOGETHER: i. Ice/Heat ii. Over the counter pain medication iii. Prescription pain medication b. Most patients will experience some swelling and bruising around the incisions.  Ice packs or heating pads (30-60 minutes up to 6 times a day) will help. Use ice for the first few days to help decrease swelling and bruising, then switch to heat to help relax tight/sore spots and speed recovery.  Some people prefer to use ice alone, heat alone, alternating between ice & heat.  Experiment to what works for you.  Swelling and bruising can take several weeks to resolve.   c. It is helpful to take an over-the-counter pain medication regularly for the first few weeks.  Choose one of the following that works best for you: i. Naproxen (Aleve, etc)  Two 220mg  tabs twice a day ii. Ibuprofen (Advil, etc) Three 200mg  tabs four times a day (every meal & bedtime) iii. Acetaminophen (Tylenol, etc) 500-650mg  four times a day (every meal & bedtime) d. A  prescription for pain medication (such as oxycodone, hydrocodone, etc) should be given to you upon discharge.  Take your pain medication as prescribed.  i. If you are having problems/concerns with the prescription medicine (does not control pain, nausea, vomiting, rash, itching, etc), please call us [phone] to see if we need to switch you to a different pain medicine that will work better for you and/or control your side effect better. ii. If you  need a refill on your pain medication, please contact your pharmacy.  They will contact our office to request authorization. Prescriptions will not be filled after 5 pm or on week-ends. 4. Avoid getting constipated.  Between the surgery and the pain medications, it is common to experience some constipation.  Increasing fluid intake and taking a fiber supplement (such as Metamucil, Citrucel, FiberCon, MiraLax, etc) 1-2 times a day regularly will usually help prevent this problem from occurring.  A mild laxative (prune juice, Milk of Magnesia, MiraLax, etc) should be taken according to package directions if there are no bowel movements after 48 hours.   5. Watch out for diarrhea.  If you have many loose bowel movements, simplify your diet to bland foods & liquids for a few days.  Stop any stool softeners and decrease your fiber supplement.  Switching to mild anti-diarrheal medications (Kayopectate, Pepto Bismol) can help.  If this worsens or does not improve, please call us. 6. Wash / shower every day.  You may shower over the incision / wound.  Avoid baths until the skin is fully healed.  Keep a dry gauze or pad on your bottom incision at all times. 7. Remove your waterproof bandages 5 days after surgery.  You may leave the incision open to air.  You may replace a dressing/Band-Aid to cover the incision for comfort if you wish.  Dry dressing to tailbone wound daily. 8. ACTIVITIES as tolerated:   a. You may resume regular (light) daily activities beginning the next  day--such as daily self-care, walking, climbing stairs--gradually increasing activities as tolerated.  If you can walk 30 minutes without difficulty, it is safe to try more intense activity such as jogging, treadmill, bicycling, low-impact aerobics, swimming, etc. b. Save the most intensive and strenuous activity for last such as sit-ups, heavy lifting, contact sports, etc  Refrain from any heavy lifting or straining until you are off narcotics for  pain control.   c. DO NOT PUSH THROUGH PAIN.  Let pain be your guide: If it hurts to do something, don't do it.  Pain is your body warning you to avoid that activity for another week until the pain goes down. d. You may drive when you are no longer taking prescription pain medication, you can comfortably wear a seatbelt, and you can safely maneuver your car and apply brakes. e. Dennis Bast may have sexual intercourse when it is comfortable.  9. FOLLOW UP in our office a. Please call [facility] at [phone] to set up an appointment to see your surgeon in the office for a follow-up appointment approximately 1-2 weeks after your surgery. b. Make sure that you call for this appointment the day you arrive home to insure a convenient appointment time. 10. IF YOU HAVE DISABILITY OR FAMILY LEAVE FORMS, BRING THEM TO THE OFFICE FOR PROCESSING.  DO NOT GIVE THEM TO YOUR DOCTOR.   WHEN TO CALL us [phone]: 1. Poor pain control 2. Reactions / problems with new medications (rash/itching, nausea, etc)  3. Fever over 101.5 F (38.5 C) 4. Inability to urinate 5. Nausea and/or vomiting 6. Worsening swelling or bruising 7. Continued bleeding from incision. 8. Increased pain, redness, or drainage from the incision  The clinic staff is available to answer your questions during regular business hours (8:30am-5pm).  Please dont hesitate to call and ask to speak to one of our nurses for clinical concerns.   A surgeon from [facility] is always on call at the hospitals   If you have a medical emergency, go to the nearest emergency room or call 911.    [facility], [place], [place], [place], [place]  [address] ? MAIN: [phone] ? TOLL FREE: [phone] ? FAX [phone] [email or web address]

## 2016-09-05 LAB — CBC
HCT: 28.5 % — ABNORMAL LOW (ref 36.0–46.0)
Hemoglobin: 9.8 g/dL — ABNORMAL LOW (ref 12.0–15.0)
MCH: 36.6 pg — ABNORMAL HIGH (ref 26.0–34.0)
MCHC: 34.4 g/dL (ref 30.0–36.0)
MCV: 106.3 fL — ABNORMAL HIGH (ref 78.0–100.0)
Platelets: 286 10*3/uL (ref 150–400)
RBC: 2.68 MIL/uL — ABNORMAL LOW (ref 3.87–5.11)
RDW: 13.9 % (ref 11.5–15.5)
WBC: 8.1 10*3/uL (ref 4.0–10.5)

## 2016-09-05 MED ORDER — HYDROCODONE-ACETAMINOPHEN 5-325 MG PO TABS: 1.0000 | ORAL_TABLET | ORAL | 0 refills | Status: DC | PRN

## 2016-09-05 MED ORDER — HEPARIN SOD (PORK) LOCK FLUSH 100 UNIT/ML IV SOLN
500.0000 [IU] | INTRAVENOUS | Status: AC | PRN
  Administered 2016-09-05: 500 [IU]

## 2016-09-05 NOTE — Progress Notes (Signed)
Discharge instructions and prescriptions were reviewed and given to patient, all questions were answered. Patient was sent home with colostomy supplies and instructions as well. Patient was taken to main entrance via wheelchair. [place]

## 2016-09-05 NOTE — Evaluation (Signed)
   Occupational Therapy Evaluation Patient Details Name: [name] MRN: [id number] DOB: 01/21/1951 Today's Date: 2016/09/20    History of Present Illness Pt admitted with recurrent anal CA and s/p abdominal perineal resection with permanent colostomy 09/01/16.  Pt with hx of Breast CA s/p mastectomy and with chemo related neuropathy   Clinical Impression   Pt feels she is at baseline with ADL activity   Follow Up Recommendations  No OT follow up    Equipment Recommendations  None recommended by OT;Other (comment) (pt will borrow anything she needs)    Recommendations for Other Services       Precautions / Restrictions Restrictions Weight Bearing Restrictions: No      Mobility Bed Mobility Overal bed mobility: Independent                Transfers Overall transfer level: Independent                         ADL Overall ADL's : At baseline                                                       Pertinent Vitals/Pain Pain Assessment: No/denies pain     Hand Dominance     Extremity/Trunk Assessment Upper Extremity Assessment Upper Extremity Assessment: Overall WFL for tasks assessed   Lower Extremity Assessment Lower Extremity Assessment: Overall WFL for tasks assessed       Communication Communication Communication: No difficulties   Cognition Arousal/Alertness: Awake/alert Behavior During Therapy: WFL for tasks assessed/performed Overall Cognitive Status: Within Functional Limits for tasks assessed                                Home Living Family/patient expects to be discharged to:: Private residence Living Arrangements: Alone Available Help at Discharge: Family;Friend(s);Available 24 hours/day;Available PRN/intermittently Type of Home: House Home Access: Stairs to enter [employer] of Steps: 7 Entrance Stairs-Rails: Right Home Layout: One level     Bathroom Shower/Tub: Tub/shower  unit Shower/tub characteristics: Door       Home Equipment: None   Additional Comments: Pt states she can borrow any equipment she needs from family members`      Prior Functioning/Environment Level of Independence: Independent                       OT Goals(Current goals can be found in the care plan section) Acute Rehab OT Goals Patient Stated Goal: Regain IND and return home  OT Frequency:                End of Session    Activity Tolerance: Patient tolerated treatment well Patient left: in chair;with call bell/phone within reach   Time: 1130-1148 OT Time Calculation (min): 18 min Charges:  OT General Charges $OT Visit: 1 Procedure OT Evaluation $OT Eval Low Complexity: 1 Procedure G-Codes:    Payton Mccallum D 09-20-2016, 11:57 AM

## 2016-09-05 NOTE — Progress Notes (Signed)
Assessment Active Problems:   Anal cancer s/p APR 09/01/16-doing well; wounds look good.   Plan:  Discharge today.  Will get [facility] to assist with colostomy care.   LOS: 4 days     4 Days Post-Op  Subjective: Feels good.  Tolerating diet.  Colostomy working.  Objective: Vital signs in last 24 hours: Temp:  [98.2 F (36.8 C)-98.5 F (36.9 C)] 98.2 F (36.8 C) (01/27 0452) Pulse Rate:  [76-96] 85 (01/27 0452) Resp:  [18] 18 (01/27 0452) BP: (129-154)/(53-75) 150/53 (01/27 0452) SpO2:  [100 %] 100 % (01/27 0452) Last BM Date: 09/05/16  Intake/Output from previous day: 01/26 0701 - 01/27 0700 In: 250 [P.O.:240; I.V.:10] Out: 1800 [Urine:1500; Stool:300] Intake/Output this shift: Total I/O In: 120 [P.O.:120] Out: -   PE: General- In NAD Abdomen-soft, incision clean and intact, colostomy viable with stool output Perineal wound clean and intact  Lab Results:   Recent Labs  09/04/16 0416 09/05/16 0437  WBC 10.5 8.1  HGB 9.3* 9.8*  HCT 27.7* 28.5*  PLT 268 286   BMET  Recent Labs  09/03/16 0541 09/04/16 0416  NA 141 142  K 3.5 4.2  CL 111 111  CO2 25 27  GLUCOSE 113* 113*  BUN 7 <5*  CREATININE 0.88 0.81  CALCIUM 7.9* 8.2*   PT/INR No results for input(s): LABPROT, INR in the last 72 hours. Comprehensive Metabolic Panel:    Component Value Date/Time   NA 142 09/04/2016 0416   NA 141 09/03/2016 0541   K 4.2 09/04/2016 0416   K 3.5 09/03/2016 0541   CL 111 09/04/2016 0416   CL 111 09/03/2016 0541   CO2 27 09/04/2016 0416   CO2 25 09/03/2016 0541   BUN <5 (L) 09/04/2016 0416   BUN 7 09/03/2016 0541   CREATININE 0.81 09/04/2016 0416   CREATININE 0.88 09/03/2016 0541   GLUCOSE 113 (H) 09/04/2016 0416   GLUCOSE 113 (H) 09/03/2016 0541   CALCIUM 8.2 (L) 09/04/2016 0416   CALCIUM 7.9 (L) 09/03/2016 0541   AST 24 08/25/2016 0958   AST 23 08/11/2016 0957   ALT 16 08/25/2016 0958   ALT 17 08/11/2016 0957   ALKPHOS 64 08/25/2016 0958   ALKPHOS 79  08/11/2016 0957   BILITOT 0.3 08/25/2016 0958   BILITOT 0.8 08/11/2016 0957   PROT 7.0 08/25/2016 0958   PROT 6.8 08/11/2016 0957   ALBUMIN 3.9 08/25/2016 0958   ALBUMIN 3.8 08/11/2016 0957     Studies/Results: No results found.  Anti-infectives: Anti-infectives    Start     Dose/Rate Route Frequency Ordered Stop   09/01/16 2200  cefoTEtan (CEFOTAN) 2 g in dextrose 5 % 50 mL IVPB     2 g 100 mL/hr over 30 Minutes Intravenous Every 12 hours 09/01/16 1722 09/01/16 2309   09/01/16 1055  cefoTEtan (CEFOTAN) 2 g in dextrose 5 % 50 mL IVPB     2 g 100 mL/hr over 30 Minutes Intravenous On call to O.R. 09/01/16 1055 09/01/16 1241       Amiah Frohlich J 09/05/2016

## 2016-09-05 NOTE — Care Management Note (Addendum)
Case Management Note  Patient Details  Name: [name] MRN: [id number] Date of Birth: Dec 19, 1950  Subjective/Objective:    Recurrent anal cancer, s/p abdominal perineal resection with permanent colostomy 09/01/2016                Action/Plan: NCM spoke to pt and offered choice for [facility]. Pt requested [facility] for HH. Contacted [facility] Liaison with new referral. Unit RN will provided additional supplies explained HHRN will be out 24-48 hours for new start of care.   Expected Discharge Date:  09/05/16               Expected Discharge Plan:  [place]  In-House Referral:  NA  Discharge planning Services  CM Consult  Post Acute Care Choice:  Home Health Choice offered to:  Patient  DME Arranged:  N/A DME Agency:  NA  HH Arranged:  RN [place] Agency:  [place]  Status of Service:  Completed, signed off  If discussed at [place] of Stay Meetings, dates discussed:    Additional Comments:  Erenest Rasher, RN 09/05/2016, 12:18 PM

## 2016-09-08 ENCOUNTER — Encounter (HOSPITAL_COMMUNITY): Payer: BLUE CROSS/BLUE SHIELD

## 2016-09-08 DIAGNOSIS — C210 Malignant neoplasm of anus, unspecified: Secondary | ICD-10-CM

## 2016-09-08 DIAGNOSIS — C7951 Secondary malignant neoplasm of bone: Secondary | ICD-10-CM | POA: Diagnosis present

## 2016-09-08 LAB — CBC WITH DIFFERENTIAL/PLATELET
Basophils Absolute: 0 10*3/uL (ref 0.0–0.1)
Basophils Relative: 0 %
Eosinophils Absolute: 0.2 10*3/uL (ref 0.0–0.7)
Eosinophils Relative: 2 %
HCT: 29.2 % — ABNORMAL LOW (ref 36.0–46.0)
Hemoglobin: 10 g/dL — ABNORMAL LOW (ref 12.0–15.0)
Lymphocytes Relative: 9 %
Lymphs Abs: 1 10*3/uL (ref 0.7–4.0)
MCH: 36.8 pg — ABNORMAL HIGH (ref 26.0–34.0)
MCHC: 34.2 g/dL (ref 30.0–36.0)
MCV: 107.4 fL — ABNORMAL HIGH (ref 78.0–100.0)
Monocytes Absolute: 0.5 10*3/uL (ref 0.1–1.0)
Monocytes Relative: 5 %
Neutro Abs: 8.6 10*3/uL — ABNORMAL HIGH (ref 1.7–7.7)
Neutrophils Relative %: 84 %
Platelets: 284 10*3/uL (ref 150–400)
RBC: 2.72 MIL/uL — ABNORMAL LOW (ref 3.87–5.11)
RDW: 14.1 % (ref 11.5–15.5)
WBC: 10.3 10*3/uL (ref 4.0–10.5)

## 2016-09-08 LAB — COMPREHENSIVE METABOLIC PANEL
ALT: 56 U/L — ABNORMAL HIGH (ref 14–54)
AST: 49 U/L — ABNORMAL HIGH (ref 15–41)
Albumin: 3.4 g/dL — ABNORMAL LOW (ref 3.5–5.0)
Alkaline Phosphatase: 62 U/L (ref 38–126)
Anion gap: 8 (ref 5–15)
BUN: 15 mg/dL (ref 6–20)
CO2: 27 mmol/L (ref 22–32)
Calcium: 9.2 mg/dL (ref 8.9–10.3)
Chloride: 102 mmol/L (ref 101–111)
Creatinine, Ser: 1.13 mg/dL — ABNORMAL HIGH (ref 0.44–1.00)
GFR calc Af Amer: 59 mL/min — ABNORMAL LOW (ref >60)
GFR calc non Af Amer: 51 mL/min — ABNORMAL LOW (ref >60)
Glucose, Bld: 119 mg/dL — ABNORMAL HIGH (ref 65–99)
Potassium: 4.2 mmol/L (ref 3.5–5.1)
Sodium: 137 mmol/L (ref 135–145)
Total Bilirubin: 0.4 mg/dL (ref 0.3–1.2)
Total Protein: 6.6 g/dL (ref 6.5–8.1)

## 2016-09-09 ENCOUNTER — Other Ambulatory Visit (HOSPITAL_COMMUNITY): Payer: BLUE CROSS/BLUE SHIELD

## 2016-09-09 ENCOUNTER — Encounter (HOSPITAL_BASED_OUTPATIENT_CLINIC_OR_DEPARTMENT_OTHER): Payer: BLUE CROSS/BLUE SHIELD

## 2016-09-09 ENCOUNTER — Encounter (HOSPITAL_COMMUNITY): Payer: Self-pay | Admitting: Hematology & Oncology

## 2016-09-09 ENCOUNTER — Other Ambulatory Visit (HOSPITAL_COMMUNITY): Payer: Self-pay | Admitting: Oncology

## 2016-09-09 VITALS — BP 137/48 | HR 101 | Temp 98.2°F | Resp 18

## 2016-09-09 DIAGNOSIS — C50912 Malignant neoplasm of unspecified site of left female breast: Secondary | ICD-10-CM | POA: Diagnosis not present

## 2016-09-09 DIAGNOSIS — C7951 Secondary malignant neoplasm of bone: Secondary | ICD-10-CM | POA: Diagnosis not present

## 2016-09-09 MED ORDER — DENOSUMAB 120 MG/1.7ML ~~LOC~~ SOLN
120.0000 mg | Freq: Once | SUBCUTANEOUS | Status: AC
Start: 2016-09-09 — End: 2016-09-09
  Administered 2016-09-09: 120 mg via SUBCUTANEOUS
  Filled 2016-09-09: qty 1.7

## 2016-09-09 NOTE — Progress Notes (Signed)
[name] presents today for injection per MD orders. Xgeva 120 mg administered SQ in right Abdomen. Administration without incident. Patient tolerated well.

## 2016-09-09 NOTE — Progress Notes (Unsigned)
Error  ROS  This encounter was created in error - please disregard.

## 2016-09-09 NOTE — Patient Instructions (Signed)
[facility] at [facility] Discharge Instructions  RECOMMENDATIONS MADE BY THE CONSULTANT AND ANY TEST RESULTS WILL BE SENT TO YOUR REFERRING PHYSICIAN.  Xgeva 120 mg injection given as ordered. Return as scheduled.  Thank you for choosing [facility] at [facility] to provide your oncology and hematology care.  To afford each patient quality time with our provider, please arrive at least 15 minutes before your scheduled appointment time.    If you have a lab appointment with the Cancer Center please come in thru the  Main Entrance and check in at the main information desk  You need to re-schedule your appointment should you arrive 10 or more minutes late.  We strive to give you quality time with our providers, and arriving late affects you and other patients whose appointments are after yours.  Also, if you no show three or more times for appointments you may be dismissed from the clinic at the providers discretion.     Again, thank you for choosing [facility].  Our hope is that these requests will decrease the amount of time that you wait before being seen by our physicians.       _____________________________________________________________  Should you have questions after your visit to [facility], please contact our office at [phone] between the hours of 8:30 a.m. and 4:30 p.m.  Voicemails left after 4:30 p.m. will not be returned until the following business day.  For prescription refill requests, have your pharmacy contact our office.       Resources For Cancer Patients and their Caregivers ? American Cancer Society: Can assist with transportation, wigs, general needs, runs Look Good Feel Better.        1-888-227-6333 ? Cancer Care: Provides financial assistance, online support groups, medication/co-pay assistance.  1-800-813-HOPE (4673) ? [facility] Assists [place] cancer  patients and their families through emotional , educational and financial support.  336-427-4357 ? [place] DSS Where to apply for food stamps, Medicaid and utility assistance. 336-342-1394 ? [facility]: Transportation to medical appointments. 336-347-2287 ? Social Security Administration: May apply for disability if have a Stage IV cancer. 336-342-7796 1-800-772-1213 ? [place] Aging, Disability and Transit Services: Assists with nutrition, care and transit needs. 336-349-2343  Cancer Center Support Programs: @10RELATIVEDAYS@ > Cancer Support Group  2nd Tuesday of the month 1pm-2pm, Journey Room  > Creative Journey  3rd Tuesday of the month 1130am-1pm, Journey Room  > Look Good Feel Better  1st Wednesday of the month 10am-12 noon, Journey Room (Call American Cancer Society to register 1-800-395-5775)   

## 2016-09-14 ENCOUNTER — Encounter (HOSPITAL_COMMUNITY): Payer: Self-pay | Admitting: Oncology

## 2016-09-14 ENCOUNTER — Encounter (HOSPITAL_COMMUNITY): Payer: BLUE CROSS/BLUE SHIELD

## 2016-09-14 ENCOUNTER — Encounter (HOSPITAL_COMMUNITY): Payer: BLUE CROSS/BLUE SHIELD | Attending: Oncology | Admitting: Oncology

## 2016-09-14 VITALS — BP 109/60 | HR 72 | Temp 98.2°F | Resp 16

## 2016-09-14 DIAGNOSIS — C7951 Secondary malignant neoplasm of bone: Secondary | ICD-10-CM | POA: Diagnosis not present

## 2016-09-14 DIAGNOSIS — M810 Age-related osteoporosis without current pathological fracture: Secondary | ICD-10-CM | POA: Diagnosis not present

## 2016-09-14 DIAGNOSIS — C210 Malignant neoplasm of anus, unspecified: Secondary | ICD-10-CM

## 2016-09-14 DIAGNOSIS — D649 Anemia, unspecified: Secondary | ICD-10-CM

## 2016-09-14 DIAGNOSIS — C218 Malignant neoplasm of overlapping sites of rectum, anus and anal canal: Secondary | ICD-10-CM | POA: Diagnosis not present

## 2016-09-14 DIAGNOSIS — Z1501 Genetic susceptibility to malignant neoplasm of breast: Secondary | ICD-10-CM | POA: Diagnosis not present

## 2016-09-14 DIAGNOSIS — Z1502 Genetic susceptibility to malignant neoplasm of ovary: Secondary | ICD-10-CM | POA: Diagnosis not present

## 2016-09-14 DIAGNOSIS — C50912 Malignant neoplasm of unspecified site of left female breast: Secondary | ICD-10-CM | POA: Diagnosis not present

## 2016-09-14 DIAGNOSIS — C50919 Malignant neoplasm of unspecified site of unspecified female breast: Secondary | ICD-10-CM

## 2016-09-14 DIAGNOSIS — C778 Secondary and unspecified malignant neoplasm of lymph nodes of multiple regions: Secondary | ICD-10-CM

## 2016-09-14 LAB — CBC WITH DIFFERENTIAL/PLATELET
Basophils Absolute: 0 10*3/uL (ref 0.0–0.1)
Basophils Relative: 0 %
Eosinophils Absolute: 0.1 10*3/uL (ref 0.0–0.7)
Eosinophils Relative: 2 %
HCT: 25.5 % — ABNORMAL LOW (ref 36.0–46.0)
Hemoglobin: 8.6 g/dL — ABNORMAL LOW (ref 12.0–15.0)
Lymphocytes Relative: 12 %
Lymphs Abs: 0.7 10*3/uL (ref 0.7–4.0)
MCH: 36.6 pg — ABNORMAL HIGH (ref 26.0–34.0)
MCHC: 33.7 g/dL (ref 30.0–36.0)
MCV: 108.5 fL — ABNORMAL HIGH (ref 78.0–100.0)
Monocytes Absolute: 0.2 10*3/uL (ref 0.1–1.0)
Monocytes Relative: 4 %
Neutro Abs: 4.3 10*3/uL (ref 1.7–7.7)
Neutrophils Relative %: 82 %
Platelets: 223 10*3/uL (ref 150–400)
RBC: 2.35 MIL/uL — ABNORMAL LOW (ref 3.87–5.11)
RDW: 14.5 % (ref 11.5–15.5)
WBC: 5.3 10*3/uL (ref 4.0–10.5)

## 2016-09-14 LAB — COMPREHENSIVE METABOLIC PANEL
ALT: 20 U/L (ref 14–54)
AST: 19 U/L (ref 15–41)
Albumin: 3.3 g/dL — ABNORMAL LOW (ref 3.5–5.0)
Alkaline Phosphatase: 58 U/L (ref 38–126)
Anion gap: 5 (ref 5–15)
BUN: 12 mg/dL (ref 6–20)
CO2: 25 mmol/L (ref 22–32)
Calcium: 8.1 mg/dL — ABNORMAL LOW (ref 8.9–10.3)
Chloride: 108 mmol/L (ref 101–111)
Creatinine, Ser: 0.98 mg/dL (ref 0.44–1.00)
GFR calc Af Amer: >60 mL/min (ref >60)
GFR calc non Af Amer: >60 mL/min (ref >60)
Glucose, Bld: 127 mg/dL — ABNORMAL HIGH (ref 65–99)
Potassium: 3.8 mmol/L (ref 3.5–5.1)
Sodium: 138 mmol/L (ref 135–145)
Total Bilirubin: 0.5 mg/dL (ref 0.3–1.2)
Total Protein: 6.3 g/dL — ABNORMAL LOW (ref 6.5–8.1)

## 2016-09-14 MED ORDER — FENTANYL 12 MCG/HR TD PT72: 12.5000 ug | MEDICATED_PATCH | TRANSDERMAL | 0 refills | Status: DC

## 2016-09-14 NOTE — Assessment & Plan Note (Signed)
Stage IV breast cancer ER/PR positive, HER-2/neu negative. BRCA2 POSITIVE.  History of stage IIIc left breast cancer in 2009 treated with neoadjuvant chemotherapy consisting of epirubicin and Cytoxan followed by 4 cycles of docetaxel from 04/16/2008-08/13/2008 with the initiation of Herceptin for 52 weeks at the start of docetaxel. She then underwent a left mastectomy on 08/30/2008 showing persistent disease in the breast and lymph nodes. This was followed by radiation therapy from 09/17/2008-11/26/2008 to the left chest wall, supraclavicular, and axillary node areas. She then moved on to antiestrogen therapy with letrozole (11/27/2008-04/20/2011). She was found to have recurrent breast cancer on supraclavicular lymph node biopsy on 03/30/2011 leading to a change in antiestrogen therapy to tamoxifen (04/21/2011-06/04/2014). Pet imaging on 05/25/2014 demonstrated progression of left submandibular lymph node, mediastinal lymph node, and probable lung involvement. This was followed by a biopsy on 06/13/2014 of a left supraclavicular mass that did reveal metastatic carcinoma consistent with breast primary. She was then treated with carboplatin and Taxol for 6 cycles with an excellent response (06/25/2014-10/09/2014). She was then transition back to hormone therapy consisting of Ibrance and Faslodex (11/13/2014-06/10/2015). This was complicated by grade 3 neutropenia with cycle #2 resulting in a dose reduction of Ibrance to 100 mg for cycle 3. Unfortunately, on 06/10/2015, she was noted to have progression of disease in the right mid cervical lymph node over a 4 day period that was PET avid. Therapy was therefore changed to exemestane and everolimus (54/56/2563-89/37/3428) that was complicated by an adverse reaction consisting of diffuse pneumonitis. This regimen was therefore discontinued. She then transitioned back to systemic chemotherapy consisting of cisplatin every 14 days (08/23/2015-12/23/2015). She was put on a  drug holiday beginning on 12/23/2015 in preparation for a trip to [place] on 01/25/2016.  She was on Xeloda maintenance in a 7 day on and 7 day off fashion beginning on 04/20/2016, but progression of disease was noted in cervical lymph node.  She is NOW on [place] beginning on 08/26/2016.  Oncology history is updated.  She has seen Dr. Mauro Kaufmann Muss in the past for second opinion.  Labs today: CBC diff, CMET.  I personally reviewed and went over laboratory results with the patient.  The results are noted within this dictation.  Significant anemia is noted.    I presume her anemia is secondary to recent surgery.  Labs in 1 week: CBC diff, anemia panel, sample to blood bank.  Labs in 2-3 weeks: CBC diff, CMET, CA 27.29, and CA15-3  She is tolerating Lynparza without any treatment specific complaints.  Xgeva last given on 09/09/2016.  Return in 2-3 weeks for follow-up.

## 2016-09-14 NOTE — Patient Instructions (Addendum)
[place] at [facility] Discharge Instructions  RECOMMENDATIONS MADE BY THE CONSULTANT AND ANY TEST RESULTS WILL BE SENT TO YOUR REFERRING PHYSICIAN.  You were seen today by Kirby Crigler, PA-C Lab work in 1 week Lab work and follow up in 3 weeks   Thank you for choosing [place] at [facility] to provide your oncology and hematology care.  To afford each patient quality time with our provider, please arrive at least 15 minutes before your scheduled appointment time.    If you have a lab appointment with the [facility] please come in thru the  Main Entrance and check in at the main information desk  You need to re-schedule your appointment should you arrive 10 or more minutes late.  We strive to give you quality time with our providers, and arriving late affects you and other patients whose appointments are after yours.  Also, if you no show three or more times for appointments you may be dismissed from the clinic at the providers discretion.     Again, thank you for choosing [facility].  Our hope is that these requests will decrease the amount of time that you wait before being seen by our physicians.       _____________________________________________________________  Should you have questions after your visit to [facility], please contact our office at [phone] between the hours of 8:30 a.m. and 4:30 p.m.  Voicemails left after 4:30 p.m. will not be returned until the following business day.  For prescription refill requests, have your pharmacy contact our office.       Resources For Cancer Patients and their Caregivers ? American Cancer Society: Can assist with transportation, wigs, general needs, runs Look Good Feel Better.        2524061317 ? Cancer Care: Provides financial assistance, online support groups, medication/co-pay assistance.  1-800-813-HOPE 864-826-5363) ? [place] Assists [place] cancer patients and their families through emotional , educational and financial support.  5671042022 ? [place] DSS Where to apply for food stamps, Medicaid and utility assistance. 239-561-1940 ? [facility]: Transportation to medical appointments. (437) 788-2116 ? Social Security Administration: May apply for disability if have a Stage IV cancer. 4376581516 5480956088 ? [employer], Disability and Transit Services: Assists with nutrition, care and transit needs. [place] Support Programs: @10RELATIVEDAYS @ > Cancer Support Group  2nd Tuesday of the month 1pm-2pm, Journey Room  > Creative Journey  3rd Tuesday of the month 1130am-1pm, Journey Room  > Look Good Feel Better  1st Wednesday of the month 10am-12 noon, Journey Room (Call [place] to register [phone])

## 2016-09-14 NOTE — Progress Notes (Signed)
Wende Neighbors, MD [place] [address]  Adenocarcinoma of breast, unspecified laterality ([place]) - Plan: fentaNYL (DURAGESIC - DOSED MCG/HR) 12 MCG/HR, CBC with Differential, Comprehensive metabolic panel, Magnesium  Squamous cell carcinoma of anus (HCC) - Plan: CBC with Differential, Comprehensive metabolic panel  Anemia, unspecified type - Plan: CBC with Differential, Comprehensive metabolic panel, Vitamin O12, Folate, Iron and TIBC, Ferritin, Reticulocytes, Pathologist smear review  CURRENT THERAPY: Olaparib 300 mg BID   INTERVAL HISTORY: PAITYNN MIKUS 66 y.o. female returns for followup of Stage IV breast cancer ER/PR positive, HER-2/neu negative. BRCA2 POSITIVE. History of stage IIIc left breast cancer in 2009 treated with neoadjuvant chemotherapy consisting of epirubicin and Cytoxan followed by 4 cycles of docetaxel from 04/16/2008-08/13/2008 with the initiation of Herceptin for 52 weeks at the start of docetaxel. She then underwent a left mastectomy on 08/30/2008 showing persistent disease in the breast and lymph nodes. This was followed by radiation therapy from 09/17/2008-11/26/2008 to the left chest wall, supraclavicular, and axillary node areas. She then moved on to antiestrogen therapy with letrozole (11/27/2008-04/20/2011). She was found to have recurrent breast cancer on supraclavicular lymph node biopsy on 03/30/2011 leading to a change in antiestrogen therapy to tamoxifen (04/21/2011-06/04/2014). Pet imaging on 05/25/2014 demonstrated progression of left submandibular lymph node, mediastinal lymph node, and probable lung involvement. This was followed by a biopsy on 06/13/2014 of a left supraclavicular mass that did reveal metastatic carcinoma consistent with breast primary. She was then treated with carboplatin and Taxol for 6 cycles with an excellent response (06/25/2014-10/09/2014). She was then transition back to hormone therapy consisting of Ibrance and  Faslodex (11/13/2014-06/10/2015). This was complicated by grade 3 neutropenia with cycle #2 resulting in a dose reduction of Ibrance to 100 mg for cycle 3. Unfortunately, on 06/10/2015, she was noted to have progression of disease in the right mid cervical lymph node over a 4 day period that was PET avid. Therapy was therefore changed to exemestane and everolimus (24/82/5003-70/48/8891) that was complicated by an adverse reaction consisting of diffuse pneumonitis. This regimen was therefore discontinued. She then transitioned back to systemic chemotherapy consisting of cisplatin every 14 days (08/23/2015-12/23/2015). She was put on a drug holiday beginning on 12/23/2015 in preparation for a trip to [place] on 01/25/2016. She is now on Xeloda maintenance in a 7 day on and 7 day off fashion. AND squamous cell carcinoma of anus, HPV POSITIVE, S/P XRT with Xeloda concomitantly in the neoadjuvant setting (02/07/2016- 03/10/2016).  S/P APR by Dr. Leighton Ruff on 6/94/5038    Adenocarcinoma of left breast   04/09/2008 Pathology Results    Positive for breast cancer, ER 100%, PR 5%, HER-2/neu POSITIVE.      04/09/2008 Procedure    Biopsy of left axillary node mass       04/16/2008 - 08/13/2008 Chemotherapy    Dose dense epirubicin/Cytoxan 4 cycles followed by docetaxel 4 cycles. Herceptin initiated with docetaxel for a total of 52 weeks.      08/30/2008 Definitive Surgery    Left mastectomy revealing persistence of disease in the breast and lymph nodes.      09/17/2008 - 11/26/2008 Radiation Therapy    Radiation to left chest wall, supraclavicular, and axillary nodal areas.      11/27/2008 - 04/20/2011 Anti-estrogen oral therapy    Letrozole      03/30/2011 Procedure    Left supraclavicular lymph node biopsy      03/30/2011 Pathology Results  Recurrent breast cancer, invasive ductal type, ER 100%, PR 40%, HER-2 NEGATIVE      04/21/2011 - 06/04/2014 Anti-estrogen oral therapy    Tamoxifen       05/25/2014 PET scan    Progression in left submandibular lymph node, mediastinal lymph nodes, and probable lung involvement.      05/25/2014 Progression    PET scan demonstrates progression of disease.      06/13/2014 Procedure    Left supraclavicular mass biopsy      06/13/2014 Pathology Results    Metastatic carcinoma consistent with breast primary, involving skeletal muscle, perineural invasion, LVI, no lymphoid tissue identified, ER 98%, PR negative, HER-2 negative      06/25/2014 - 10/09/2014 Chemotherapy    Carboplatin/Taxol 6 cycles with excellent response      11/06/2014 - 06/10/2015 Chemotherapy    Palbociclib and Faslodex      01/08/2015 Adverse Reaction    Grade 3 neutropenia with cycle #2 of Palbociclib.      01/08/2015 Treatment Plan Change    Palbociclib dose reduced to 100 mg for cycle #3.      06/10/2015 Progression    Right cervical lymph node progression clinically developed over 4 days.  This lymph node was positive on PET scan in September 2016 and at that time was approximately 8 mm with possible progression in a single bone metastasis.      06/17/2015 Procedure    Biopsy of right cervical lymph node      06/17/2015 Pathology Results    Genetic sequencing performed revealing BRCA2 positivity      07/02/2015 - 08/01/2015 Chemotherapy    Exemestane/everolimus, 25 mg/ 5 mg x 7 days then increasing to 10 mg daily.      08/01/2015 PET scan    Diffuse pneumonitis.        08/01/2015 Adverse Reaction    Pneumonitis secondary to drug reaction from everolimus.      08/23/2015 - 12/23/2015 Chemotherapy    Cisplatin 40 mg/m every other week      11/15/2015 PET scan    Interval resolution of hypermetabolic airspace opacities in the lungs which were likely inflammatory. Abnormal high activity at the anal rectal junction. Very low grade residual activity at the site of L3 metastatic lesion.      12/23/2015 Treatment Plan Change    Holiday from treatment in  preparation for trip to [place].      04/20/2016 -  Chemotherapy    XELODA 1300 mg po bid 7 days on/ 7 days off       05/15/2016 PET scan    1. Partial metabolic response at the hypermetabolic anorectal junction malignancy. 2. No hypermetabolic metastatic disease. Lumbar vertebral sclerotic osseous lesions are non hypermetabolic. 3. Hypermetabolism associated with acute/early subacute fractures of the right clavicle and right anterior second through fifth ribs, without associated bone lesions. 4. Aortic atherosclerosis.  Three-vessel coronary atherosclerosis.      07/24/2016 PET scan    1. Intense metabolic activity associated mucosal thickening in the anal rectal junction is consistent with residual carcinoma. Mild increase in metabolic activity. 2. No hypermetabolic lymph nodes in the pelvis. 3. No evidence distant metastatic disease. 4. No evidence breast cancer recurrence.      07/27/2016 Procedure    1. Lymph node, biopsy, Left Posterior Neck - METASTATIC CARCINOMA. - SEE MICROSCOPIC DESCRIPTION 2. Skin , and Subcutaneous Left Posterior Neck - BENIGN SKIN AND SUBCUTANEOUS ADIPOSE TISSUE. - NO EVIDENCE OF MALIGNANCY.  07/27/2016 Pathology Results    HER2 NEGATIVE, ER/PR POSITIVE       Squamous cell carcinoma of anus ([place])   12/31/2015 Procedure    Colonoscopy by Dr. Britta Mccreedy      12/31/2015 Pathology Results    Invasive squamous cell carcinoma approximate 4 cm mass posterior wall of rectum      01/13/2016 Treatment Plan Change    Dr. Leighton Ruff consultation- no surgery.  Recommend chemoXRT      01/16/2016 Pathology Results    HPV POSITIVE      02/07/2016 - 03/10/2016 Chemotherapy    Xeloda 1450 mg PO BID _0 /15/2017 PET scan    1. Intense metabolic activity associated mucosal thickening in the anal rectal junction is consistent with residual carcinoma. Mild increase in metabolic activity. 2. No hypermetabolic lymph nodes in the pelvis. 3. No evidence distant metastatic disease. 4. No evidence breast cancer recurrence.      09/01/2016 Surgery    ABDOMINAL PERINEAL RESECTION by Dr. Leighton Ruff      5/42/7062 Pathology Results    Diagnosis 1. Anus, biopsy - INVASIVE SQUAMOUS CELL CARCINOMA. 2. Colon, resection margin (donut), additional posterior - BENIGN FIBROFATTY AND VASCULAR SOFT TISSUE. - BENIGN MUSCLE PRESENT. - NO TUMOR SEEN. 3. Colon, segmental resection for tumor, rectum and anus - INVASIVE WELL TO MODERATELY DIFFERENTIATED SQUAMOUS CELL CARCINOMA, SPANNING 4.7 CM IN GREATEST DIMENSION. - TUMOR INVADES THROUGH BOWEL AND INTO UNDERLYING FIBROFATTY AND VASCULAR SOFT TISSUE AND INVOLVES POSTERIOR RADIAL NON-PERITONEALIZED MARGIN. - PROXIMAL AND DISTAL MARGINS ARE NEGATIVE. - THIRTEEN BENIGN LYMPH NODES WITH NO TUMOR SEEN (0/13). - SEE ONCOLOGY TEMPLATE.       She reports "Reynauds is back."  She reports that this was the case when she was first diagnosed approximately 8 years ago. I looked up side effects of Lynparza and this is not listed as a possible toxicity.  She reports fatigue and tiredness. I suspect this is secondary to her anemia in addition to recent surgery.  She currently is not taking her magnesium oxide. I will add a magnesium level to her next blood work.  Her pain is currently well controlled. She rates it as a 5 out of 10. She is taking 75 g of fentanyl.  She hopes that she can decrease this in the near future. As a result, she  requests a prescription for 12.5 g of fentanyl assuming that she may be on a taper her dose prior to her next follow-up visit.  He notes that her left posterior cervical lymph nodes have gotten smaller in size.  Review of Systems  Constitutional: Positive for malaise/fatigue. Negative for chills, fever and weight loss.  HENT: Negative.   Respiratory: Negative.  Negative for cough.   Cardiovascular: Negative.  Negative for chest pain.  Gastrointestinal: Negative.  Negative for constipation, diarrhea, nausea and vomiting.  Genitourinary: Negative.   Musculoskeletal: Negative.  Skin: Negative.   Neurological: Positive for weakness.  Endo/Heme/Allergies: Negative.   Psychiatric/Behavioral: Negative.     Past Medical History:  Diagnosis Date  . Adenocarcinoma of breast ([place]) 01/27/2011   stage IIIc breast cancer  . Bladder infection   . Bone metastasis ([place]) 01/17/2016  . Borderline hypertension   . Breast cancer ([place])   . Closed fracture of humerus sept 2013  . Depression   . Family history of adverse reaction to anesthesia    father remembered his surgery  . Fatigue   . GERD (gastroesophageal reflux disease)   . Hypertension   . 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 Osteoporosis 01/27/2011  . Pneumonia   . Port catheter in place 04/14/2013  . Post-mastectomy lymphedema syndrome   . 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 Reflux   . Retinal detachment   . 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 Swollen lymph nodes   . URI (upper respiratory infection) 02/17/12  . Wears glasses     Past Surgical History:  Procedure Laterality Date  . ABDOMINAL PERINEAL BOWEL RESECTION N/A 09/01/2016   Procedure: ABDOMINAL PERINEAL RESECTION PERMANENT COLOSTOMY;  Surgeon: Leighton Ruff, MD;  Location: [facility];  Service: General;  Laterality: N/A;  . biopsy of lymph node     super clavicle  . BTL    .  CATARACT EXTRACTION    . cervical disc fusioni    . LYMPH NODE BIOPSY Right 06/19/2015   Procedure: RIGHT NECK LYMPH NODE EXCISION;  Surgeon: Rolm Bookbinder, MD;  Location: [facility];  Service: General;  Laterality: Right;  . MASS EXCISION Left 07/27/2016   Procedure: EXCISION OF POSTERIOR NECK MASS;  Surgeon: Rolm Bookbinder, MD;  Location: [facility];  Service: General;  Laterality: Left;  Marland Kitchen MASTECTOMY MODIFIED RADICAL     left breast  . Port- a-cath insertion    . retinal detachment and repair      Family History  Problem Relation Age of Onset  . Hypertension Mother   . Heart disease Mother     atrial fib  . Heart disease Father   . Other Father     glaucoma    Social History   Social History  . Marital status: Divorced    Spouse name: N/A  . Number of children: N/A  . Years of education: N/A   Social History Main Topics  . Smoking status: Never Smoker  . Smokeless tobacco: Never Used  . Alcohol use 1.8 oz/week    3 Glasses of wine per week     Comment: Drinks 3 drinks very other week  . Drug use: Yes    Types: Marijuana     Comment: During chemo, none over 1 Year  . Sexual activity: No   Other Topics Concern  . None   Social History Narrative  . None     PHYSICAL EXAMINATION  ECOG PERFORMANCE STATUS: 1 - Symptomatic but completely ambulatory  Vitals:   09/14/16 1118  BP: 109/60  Pulse: 72  Resp: 16  Temp: 98.2 F (36.8 C)    GENERAL:alert, no distress, cooperative, pale, smiling and fatigued/tired, unaccompanied SKIN: skin color, texture, turgor are normal, no rashes or significant lesions HEAD: Normocephalic, No masses, lesions, tenderness or abnormalities EYES: normal, EOMI, Conjunctiva are pink and non-injected EARS: External ears normal OROPHARYNX:lips, buccal mucosa, and tongue normal and mucous membranes  are moist  NECK: supple, trachea midline, left posterior cervical chain lymph nodes measuring 5 mm in  size. LYMPH:  As above. BREAST:not examined LUNGS: clear to auscultation  HEART: regular rate & rhythm, no murmurs, no gallops, S1 normal and S2 normal ABDOMEN:abdomen soft and normal bowel sounds BACK: Back symmetric, no curvature. EXTREMITIES:less then 2 second capillary refill, no joint deformities, effusion, or inflammation, no skin discoloration, no cyanosis  NEURO: alert & oriented x 3 with fluent speech, no focal motor/sensory deficits, gait normal    LABORATORY DATA: CBC    Component Value Date/Time   WBC 5.3 09/14/2016 1025   RBC 2.35 (L) 09/14/2016 1025   HGB 8.6 (L) 09/14/2016 1025   HCT 25.5 (L) 09/14/2016 1025   PLT 223 09/14/2016 1025   MCV 108.5 (H) 09/14/2016 1025   MCH 36.6 (H) 09/14/2016 1025   MCHC 33.7 09/14/2016 1025   RDW 14.5 09/14/2016 1025   LYMPHSABS 0.7 09/14/2016 1025   MONOABS 0.2 09/14/2016 1025   EOSABS 0.1 09/14/2016 1025   BASOSABS 0.0 09/14/2016 1025      Chemistry      Component Value Date/Time   NA 138 09/14/2016 1025   K 3.8 09/14/2016 1025   CL 108 09/14/2016 1025   CO2 25 09/14/2016 1025   BUN 12 09/14/2016 1025   CREATININE 0.98 09/14/2016 1025      Component Value Date/Time   CALCIUM 8.1 (L) 09/14/2016 1025   ALKPHOS 58 09/14/2016 1025   AST 19 09/14/2016 1025   ALT 20 09/14/2016 1025   BILITOT 0.5 09/14/2016 1025        PENDING LABS:   RADIOGRAPHIC STUDIES:  No results found.   PATHOLOGY:    ASSESSMENT AND PLAN:  Adenocarcinoma of left breast Stage IV breast cancer ER/PR positive, HER-2/neu negative. BRCA2 POSITIVE.  History of stage IIIc left breast cancer in 2009 treated with neoadjuvant chemotherapy consisting of epirubicin and Cytoxan followed by 4 cycles of docetaxel from 04/16/2008-08/13/2008 with the initiation of Herceptin for 52 weeks at the start of docetaxel. She then underwent a left mastectomy on 08/30/2008 showing persistent disease in the breast and lymph nodes. This was followed by radiation  therapy from 09/17/2008-11/26/2008 to the left chest wall, supraclavicular, and axillary node areas. She then moved on to antiestrogen therapy with letrozole (11/27/2008-04/20/2011). She was found to have recurrent breast cancer on supraclavicular lymph node biopsy on 03/30/2011 leading to a change in antiestrogen therapy to tamoxifen (04/21/2011-06/04/2014). Pet imaging on 05/25/2014 demonstrated progression of left submandibular lymph node, mediastinal lymph node, and probable lung involvement. This was followed by a biopsy on 06/13/2014 of a left supraclavicular mass that did reveal metastatic carcinoma consistent with breast primary. She was then treated with carboplatin and Taxol for 6 cycles with an excellent response (06/25/2014-10/09/2014). She was then transition back to hormone therapy consisting of Ibrance and Faslodex (11/13/2014-06/10/2015). This was complicated by grade 3 neutropenia with cycle #2 resulting in a dose reduction of Ibrance to 100 mg for cycle 3. Unfortunately, on 06/10/2015, she was noted to have progression of disease in the right mid cervical lymph node over a 4 day period that was PET avid. Therapy was therefore changed to exemestane and everolimus (24/23/5361-44/31/5400) that was complicated by an adverse reaction consisting of diffuse pneumonitis. This regimen was therefore discontinued. She then transitioned back to systemic chemotherapy consisting of cisplatin every 14 days (08/23/2015-12/23/2015). She was put on a drug holiday beginning on 12/23/2015 in preparation for a trip to [place]  on 01/25/2016.  She was on Xeloda maintenance in a 7 day on and 7 day off fashion beginning on 04/20/2016, but progression of disease was noted in cervical lymph node.  She is NOW on [place] beginning on 08/26/2016.  Oncology history is updated.  She has seen Dr. Mauro Kaufmann Muss in the past for second opinion.  Labs today: CBC diff, CMET.  I personally reviewed and went over laboratory results  with the patient.  The results are noted within this dictation.  Significant anemia is noted.    I presume her anemia is secondary to recent surgery.  Labs in 1 week: CBC diff, anemia panel, sample to blood bank.  Labs in 2-3 weeks: CBC diff, CMET, CA 27.29, and CA15-3  She is tolerating Lynparza without any treatment specific complaints.  Xgeva last given on 09/09/2016.  Return in 2-3 weeks for follow-up.  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  S/P APR by Dr. Leighton Ruff on 6/60/6004, demonstrating a partial response to therapy, Stage IIA (T2N0M0).  Initial medical oncology recommendations provided by Dr. Tressie Stalker at [facility].  Dr. Tressie Stalker does dictate that there is a history of anal intercourse in the remote past.  Oncology history updated.  Staging in [facility] problem list is completed.   ORDERS PLACED FOR THIS ENCOUNTER: Orders Placed This Encounter  Procedures  . CBC with Differential  . Comprehensive metabolic panel  . Vitamin B12  . Folate  . Iron and TIBC  . Ferritin  . Reticulocytes  . Pathologist smear review  . CBC with Differential  . Comprehensive metabolic panel  . Magnesium    MEDICATIONS PRESCRIBED THIS ENCOUNTER: Meds ordered this encounter  Medications  . fentaNYL (DURAGESIC - DOSED MCG/HR) 12 MCG/HR    Sig: Place 1 patch (12.5 mcg total) onto the skin every 3 (three) days.    Dispense:  10 patch    Refill:  0    Order Specific Question:   Supervising Provider    Answer:   Brunetta Genera [[id number]]    THERAPY PLAN:  Continue [facility] [place].  All questions were answered. The patient knows to call the clinic with any problems, questions or concerns. We can certainly see the patient much sooner if necessary.  Patient and plan discussed with Dr. Twana First and she is in agreement with the aforementioned.   This note is  electronically signed by: Doy Mince 09/14/2016 1:52 PM

## 2016-09-14 NOTE — Assessment & Plan Note (Signed)
Squamous cell carcinoma of anus, HPV POSITIVE, S/P XRT with Xeloda concomitantly in the neoadjuvant setting (02/07/2016- 03/10/2016).  S/P APR by Dr. Leighton Ruff on [id number], demonstrating a partial response to therapy, Stage IIA (T2N0M0).  Initial medical oncology recommendations provided by Dr. Tressie Stalker at [facility].  Dr. Tressie Stalker does dictate that there is a history of anal intercourse in the remote past.  Oncology history updated.  Staging in [facility] problem list is completed.

## 2016-09-15 NOTE — Discharge Summary (Signed)
Physician Discharge Summary  Patient ID: BELENDA ALVIAR MRN: [id number] DOB/AGE: 04-26-1951 66 y.o.  Admit date: 09/01/2016 Discharge date: 1/27//2018  Admission Diagnoses:  Anal cancer  Discharge Diagnoses:  Active Problems:   Anal cancer ([place]) s/p APR 09/01/16   H/o left breast cancer   BRCA2 positive   Discharged Condition: good  Hospital Course: She underwent the above operation and progressed well.  She learned colostomy care fairly quickly.  She was tolerating a solid diet, colostomy looked good and was working, all wounds looked good.  She was discharged on POD #4 with home health nursing.  Discharge instructions were given to her.   Discharge Exam: Blood pressure (!) 126/49, pulse 90, temperature 98.6 F (37 C), temperature source Oral, resp. rate 18, height 5' 4" (1.626 m), weight 61.7 kg (136 lb), SpO2 100 %.   Disposition: 01-Home or Self Care   Allergies as of 09/05/2016      Reactions   Ciprofloxacin Other (See Comments)   tendonitis      Medication List    STOP taking these medications   metroNIDAZOLE 500 MG tablet Commonly known as:  FLAGYL   neomycin 500 MG tablet Commonly known as:  MYCIFRADIN   traMADol 50 MG tablet Commonly known as:  ULTRAM     TAKE these medications   ACETYL L-CARNITINE PO Take 1,000 mg by mouth daily.   CALCIUM-VITAMIN D PO Take 1 tablet by mouth 2 (two) times daily. 1200 mg /1600 international units daily   docusate sodium 100 MG capsule Commonly known as:  COLACE Take 2 capsules (200 mg total) by mouth 2 (two) times daily.   escitalopram 10 MG tablet Commonly known as:  LEXAPRO Take 5 mg by mouth daily. Patiently is weaning herself off and may not be taking prior to procedure   fentaNYL 50 MCG/HR Commonly known as:  DURAGESIC - dosed mcg/hr Combine with 25 mcg patch and change every 3 days for total of 75 mcg   fentaNYL 25 MCG/HR patch Commonly known as:  DURAGESIC - dosed mcg/hr Combine with 50 mcg patch for  total of 75 mcg, change every 3 days   HYDROcodone-acetaminophen 5-325 MG tablet Commonly known as:  NORCO/VICODIN Take 1-2 tablets by mouth every 4 (four) hours as needed for moderate pain.   magnesium oxide 400 MG tablet Commonly known as:  MAG-OX Take 400 mg by mouth 2 (two) times daily.   metoprolol succinate 25 MG 24 hr tablet Commonly known as:  TOPROL-XL TAKE 1 TABLET DAILY   mirtazapine 30 MG tablet Commonly known as:  REMERON Take 1 tablet (30 mg total) by mouth at bedtime. Take 15 mg for 3 days then increase to 30 mg What changed:  additional instructions   nitrofurantoin 100 MG capsule Commonly known as:  MACRODANTIN Take 1 capsule (100 mg total) by mouth daily. What changed:  when to take this   Olaparib 150 MG Tabs Take 300 mg by mouth 2 (two) times daily.   omeprazole 20 MG capsule Commonly known as:  PRILOSEC Take 20 mg by mouth daily as needed (heartburn). What changed:  Another medication with the same name was changed. Make sure you understand how and when to take each.   omeprazole 40 MG capsule Commonly known as:  PRILOSEC Take 1 capsule (40 mg total) by mouth 2 (two) times daily. What changed:  when to take this  reasons to take this   vitamin B-12 1000 MCG tablet Commonly known as:  CYANOCOBALAMIN Take  1,000 mcg by mouth daily.      Follow-up Information    Rosario Adie., MD. Schedule an appointment as soon as possible for a visit in 2 week(s).   Specialty:  General Surgery Contact information: [address] [place] [address] [phone]        [facility] Follow up.   Why:  Home Health RN Contact information: [address] [place] [address] [phone]           Signed: Odis Hollingshead 09/15/2016, 9:08 AM

## 2016-09-16 ENCOUNTER — Telehealth (HOSPITAL_COMMUNITY): Payer: Self-pay | Admitting: *Deleted

## 2016-09-16 DIAGNOSIS — C210 Malignant neoplasm of anus, unspecified: Secondary | ICD-10-CM

## 2016-09-16 DIAGNOSIS — C50919 Malignant neoplasm of unspecified site of unspecified female breast: Secondary | ICD-10-CM

## 2016-09-16 DIAGNOSIS — C7951 Secondary malignant neoplasm of bone: Secondary | ICD-10-CM

## 2016-09-16 NOTE — Telephone Encounter (Signed)
Reviewed with PA-C, labs ordered for 09/17/16 at 11:20, and tentatively scheduled for blood transfusion on Friday at [facility]. Patient notified and verbalized understanding.

## 2016-09-17 ENCOUNTER — Other Ambulatory Visit (HOSPITAL_COMMUNITY): Payer: Self-pay

## 2016-09-17 ENCOUNTER — Other Ambulatory Visit (HOSPITAL_COMMUNITY): Payer: Self-pay | Admitting: Oncology

## 2016-09-17 ENCOUNTER — Encounter (HOSPITAL_COMMUNITY): Payer: BLUE CROSS/BLUE SHIELD

## 2016-09-17 DIAGNOSIS — D649 Anemia, unspecified: Secondary | ICD-10-CM

## 2016-09-17 DIAGNOSIS — C50919 Malignant neoplasm of unspecified site of unspecified female breast: Secondary | ICD-10-CM

## 2016-09-17 DIAGNOSIS — C210 Malignant neoplasm of anus, unspecified: Secondary | ICD-10-CM

## 2016-09-17 DIAGNOSIS — C7951 Secondary malignant neoplasm of bone: Secondary | ICD-10-CM

## 2016-09-17 DIAGNOSIS — C50912 Malignant neoplasm of unspecified site of left female breast: Secondary | ICD-10-CM | POA: Diagnosis not present

## 2016-09-17 LAB — CBC WITH DIFFERENTIAL/PLATELET
Basophils Absolute: 0 10*3/uL (ref 0.0–0.1)
Basophils Relative: 0 %
Eosinophils Absolute: 0.1 10*3/uL (ref 0.0–0.7)
Eosinophils Relative: 1 %
HCT: 26.8 % — ABNORMAL LOW (ref 36.0–46.0)
Hemoglobin: 9.2 g/dL — ABNORMAL LOW (ref 12.0–15.0)
Lymphocytes Relative: 16 %
Lymphs Abs: 0.7 10*3/uL (ref 0.7–4.0)
MCH: 36.8 pg — ABNORMAL HIGH (ref 26.0–34.0)
MCHC: 34.3 g/dL (ref 30.0–36.0)
MCV: 107.2 fL — ABNORMAL HIGH (ref 78.0–100.0)
Monocytes Absolute: 0.3 10*3/uL (ref 0.1–1.0)
Monocytes Relative: 6 %
Neutro Abs: 3.4 10*3/uL (ref 1.7–7.7)
Neutrophils Relative %: 77 %
Platelets: 304 10*3/uL (ref 150–400)
RBC: 2.5 MIL/uL — ABNORMAL LOW (ref 3.87–5.11)
RDW: 14.5 % (ref 11.5–15.5)
WBC: 4.5 10*3/uL (ref 4.0–10.5)

## 2016-09-17 LAB — SAMPLE TO BLOOD BANK

## 2016-09-17 LAB — ABO/RH: ABO/RH(D): A POS

## 2016-09-17 LAB — PREPARE RBC (CROSSMATCH)

## 2016-09-18 ENCOUNTER — Encounter (HOSPITAL_COMMUNITY): Payer: BLUE CROSS/BLUE SHIELD | Attending: Adult Health

## 2016-09-18 ENCOUNTER — Encounter (HOSPITAL_COMMUNITY): Payer: Self-pay | Admitting: Oncology

## 2016-09-18 ENCOUNTER — Other Ambulatory Visit: Payer: Self-pay

## 2016-09-18 ENCOUNTER — Other Ambulatory Visit (HOSPITAL_COMMUNITY): Payer: Self-pay | Admitting: Oncology

## 2016-09-18 ENCOUNTER — Encounter (HOSPITAL_COMMUNITY): Payer: BLUE CROSS/BLUE SHIELD | Attending: Oncology | Admitting: Oncology

## 2016-09-18 ENCOUNTER — Encounter (HOSPITAL_COMMUNITY): Payer: Self-pay

## 2016-09-18 VITALS — BP 134/61 | HR 72 | Temp 98.0°F | Resp 18

## 2016-09-18 DIAGNOSIS — R531 Weakness: Secondary | ICD-10-CM | POA: Diagnosis not present

## 2016-09-18 DIAGNOSIS — D649 Anemia, unspecified: Secondary | ICD-10-CM | POA: Diagnosis present

## 2016-09-18 DIAGNOSIS — D6489 Other specified anemias: Secondary | ICD-10-CM

## 2016-09-18 DIAGNOSIS — C50919 Malignant neoplasm of unspecified site of unspecified female breast: Secondary | ICD-10-CM

## 2016-09-18 DIAGNOSIS — R110 Nausea: Secondary | ICD-10-CM

## 2016-09-18 DIAGNOSIS — R42 Dizziness and giddiness: Secondary | ICD-10-CM

## 2016-09-18 DIAGNOSIS — D500 Iron deficiency anemia secondary to blood loss (chronic): Secondary | ICD-10-CM | POA: Diagnosis present

## 2016-09-18 DIAGNOSIS — F40240 Claustrophobia: Secondary | ICD-10-CM

## 2016-09-18 LAB — CBC WITH DIFFERENTIAL/PLATELET
Basophils Absolute: 0 10*3/uL (ref 0.0–0.1)
Basophils Relative: 0 %
Eosinophils Absolute: 0.1 10*3/uL (ref 0.0–0.7)
Eosinophils Relative: 1 %
HCT: 28.2 % — ABNORMAL LOW (ref 36.0–46.0)
Hemoglobin: 9.5 g/dL — ABNORMAL LOW (ref 12.0–15.0)
Lymphocytes Relative: 14 %
Lymphs Abs: 0.8 10*3/uL (ref 0.7–4.0)
MCH: 35.8 pg — ABNORMAL HIGH (ref 26.0–34.0)
MCHC: 33.7 g/dL (ref 30.0–36.0)
MCV: 106.4 fL — ABNORMAL HIGH (ref 78.0–100.0)
Monocytes Absolute: 0.5 10*3/uL (ref 0.1–1.0)
Monocytes Relative: 8 %
Neutro Abs: 4.4 10*3/uL (ref 1.7–7.7)
Neutrophils Relative %: 77 %
Platelets: 332 10*3/uL (ref 150–400)
RBC: 2.65 MIL/uL — ABNORMAL LOW (ref 3.87–5.11)
RDW: 14.7 % (ref 11.5–15.5)
WBC: 5.8 10*3/uL (ref 4.0–10.5)

## 2016-09-18 LAB — COMPREHENSIVE METABOLIC PANEL
ALT: 17 U/L (ref 14–54)
AST: 20 U/L (ref 15–41)
Albumin: 3.6 g/dL (ref 3.5–5.0)
Alkaline Phosphatase: 81 U/L (ref 38–126)
Anion gap: 6 (ref 5–15)
BUN: 12 mg/dL (ref 6–20)
CO2: 26 mmol/L (ref 22–32)
Calcium: 9.1 mg/dL (ref 8.9–10.3)
Chloride: 106 mmol/L (ref 101–111)
Creatinine, Ser: 1.04 mg/dL — ABNORMAL HIGH (ref 0.44–1.00)
GFR calc Af Amer: >60 mL/min (ref >60)
GFR calc non Af Amer: 56 mL/min — ABNORMAL LOW (ref >60)
Glucose, Bld: 112 mg/dL — ABNORMAL HIGH (ref 65–99)
Potassium: 3.7 mmol/L (ref 3.5–5.1)
Sodium: 138 mmol/L (ref 135–145)
Total Bilirubin: 0.3 mg/dL (ref 0.3–1.2)
Total Protein: 6.8 g/dL (ref 6.5–8.1)

## 2016-09-18 LAB — D-DIMER, QUANTITATIVE: D-Dimer, Quant: 2.1 ug/mL-FEU — ABNORMAL HIGH (ref 0.00–0.50)

## 2016-09-18 MED ORDER — HEPARIN SOD (PORK) LOCK FLUSH 100 UNIT/ML IV SOLN
500.0000 [IU] | Freq: Every day | INTRAVENOUS | Status: AC | PRN
  Administered 2016-09-18: 500 [IU]
  Filled 2016-09-18: qty 5

## 2016-09-18 MED ORDER — DIPHENHYDRAMINE HCL 25 MG PO CAPS
ORAL_CAPSULE | ORAL | Status: AC
  Filled 2016-09-18: qty 1

## 2016-09-18 MED ORDER — SODIUM CHLORIDE 0.9 % IV SOLN
250.0000 mL | Freq: Once | INTRAVENOUS | Status: AC
  Administered 2016-09-18: 250 mL via INTRAVENOUS

## 2016-09-18 MED ORDER — ONDANSETRON HCL 4 MG PO TABS
ORAL_TABLET | ORAL | Status: AC
  Filled 2016-09-18: qty 2

## 2016-09-18 MED ORDER — ACETAMINOPHEN 325 MG PO TABS: 650.0000 mg | ORAL_TABLET | Freq: Once | ORAL | Status: DC

## 2016-09-18 MED ORDER — ONDANSETRON HCL 8 MG PO TABS: 8.0000 mg | ORAL_TABLET | Freq: Three times a day (TID) | ORAL | 1 refills | Status: DC | PRN

## 2016-09-18 MED ORDER — DIPHENHYDRAMINE HCL 25 MG PO CAPS
25.0000 mg | ORAL_CAPSULE | Freq: Once | ORAL | Status: AC
  Administered 2016-09-18: 25 mg via ORAL

## 2016-09-18 MED ORDER — PROCHLORPERAZINE MALEATE 10 MG PO TABS: 10.0000 mg | ORAL_TABLET | Freq: Four times a day (QID) | ORAL | Status: DC | PRN

## 2016-09-18 MED ORDER — SODIUM CHLORIDE 0.9% FLUSH
10.0000 mL | INTRAVENOUS | Status: AC | PRN
  Administered 2016-09-18: 10 mL

## 2016-09-18 MED ORDER — ALPRAZOLAM 0.5 MG PO TABS: ORAL_TABLET | ORAL | 0 refills | Status: DC

## 2016-09-18 MED ORDER — ONDANSETRON HCL 4 MG PO TABS: 8.0000 mg | ORAL_TABLET | Freq: Once | ORAL | Status: DC

## 2016-09-18 NOTE — Patient Instructions (Signed)
[facility] at [facility] Discharge Instructions  RECOMMENDATIONS MADE BY THE CONSULTANT AND ANY TEST RESULTS WILL BE SENT TO YOUR REFERRING PHYSICIAN.  One unit of blood given today Follow up as scheduled.  Thank you for choosing [facility] at [facility] to provide your oncology and hematology care.  To afford each patient quality time with our provider, please arrive at least 15 minutes before your scheduled appointment time.    If you have a lab appointment with the Cancer Center please come in thru the  Main Entrance and check in at the main information desk  You need to re-schedule your appointment should you arrive 10 or more minutes late.  We strive to give you quality time with our providers, and arriving late affects you and other patients whose appointments are after yours.  Also, if you no show three or more times for appointments you may be dismissed from the clinic at the providers discretion.     Again, thank you for choosing [facility].  Our hope is that these requests will decrease the amount of time that you wait before being seen by our physicians.       _____________________________________________________________  Should you have questions after your visit to  Cancer Center, please contact our office at [phone] between the hours of 8:30 a.m. and 4:30 p.m.  Voicemails left after 4:30 p.m. will not be returned until the following business day.  For prescription refill requests, have your pharmacy contact our office.       Resources For Cancer Patients and their Caregivers ? American Cancer Society: Can assist with transportation, wigs, general needs, runs Look Good Feel Better.        1-888-227-6333 ? Cancer Care: Provides financial assistance, online support groups, medication/co-pay assistance.  1-800-813-HOPE (4673) ? [facility] Assists [place] cancer  patients and their families through emotional , educational and financial support.  336-427-4357 ? [place] DSS Where to apply for food stamps, Medicaid and utility assistance. 336-342-1394 ? RCATS: Transportation to medical appointments. 336-347-2287 ? Social Security Administration: May apply for disability if have a Stage IV cancer. 336-342-7796 1-800-772-1213 ? [place] Aging, Disability and Transit Services: Assists with nutrition, care and transit needs. 336-349-2343  Cancer Center Support Programs: @10RELATIVEDAYS@ > Cancer Support Group  2nd Tuesday of the month 1pm-2pm, Journey Room  > Creative Journey  3rd Tuesday of the month 1130am-1pm, Journey Room  > Look Good Feel Better  1st Wednesday of the month 10am-12 noon, Journey Room (Call American Cancer Society to register [phone])   

## 2016-09-18 NOTE — Patient Instructions (Addendum)
[name] at [facility] Discharge Instructions  RECOMMENDATIONS MADE BY THE CONSULTANT AND ANY TEST RESULTS WILL BE SENT TO YOUR REFERRING PHYSICIAN.  You were seen today by Manning Charity MRI brain by next week and follow up for results  HOLD Lynparza for 1 week until you return for follow up   Thank you for choosing [place] at [facility] to provide your oncology and hematology care.  To afford each patient quality time with our provider, please arrive at least 15 minutes before your scheduled appointment time.    If you have a lab appointment with the [facility] please come in thru the  Main Entrance and check in at the main information desk  You need to re-schedule your appointment should you arrive 10 or more minutes late.  We strive to give you quality time with our providers, and arriving late affects you and other patients whose appointments are after yours.  Also, if you no show three or more times for appointments you may be dismissed from the clinic at the providers discretion.     Again, thank you for choosing [facility].  Our hope is that these requests will decrease the amount of time that you wait before being seen by our physicians.       _____________________________________________________________  Should you have questions after your visit to [facility], please contact our office at [phone] between the hours of 8:30 a.m. and 4:30 p.m.  Voicemails left after 4:30 p.m. will not be returned until the following business day.  For prescription refill requests, have your pharmacy contact our office.       Resources For Cancer Patients and their Caregivers ? American Cancer Society: Can assist with transportation, wigs, general needs, runs Look Good Feel Better.        575 042 1010 ? Cancer Care: Provides financial assistance, online support groups, medication/co-pay assistance.   1-800-813-HOPE (430) 756-5607) ? [facility] Assists [place] cancer patients and their families through emotional , educational and financial support.  364-821-2494 ? [place] DSS Where to apply for food stamps, Medicaid and utility assistance. 402-730-5317 ? RCATS: Transportation to medical appointments. (878) 747-1512 ? Social Security Administration: May apply for disability if have a Stage IV cancer. 539-834-7608 (445) 172-7426 ? [employer], Disability and Transit Services: Assists with nutrition, care and transit needs. [place] Support Programs: @10RELATIVEDAYS @ > Cancer Support Group  2nd Tuesday of the month 1pm-2pm, Journey Room  > Creative Journey  3rd Tuesday of the month 1130am-1pm, Journey Room  > Look Good Feel Better  1st Wednesday of the month 10am-12 noon, Journey Room (Call [place] to register [phone])

## 2016-09-18 NOTE — Progress Notes (Signed)
She is seen as a work-in today.  She is here today 1 unit of PRBCs due to symptomatic anemia from blood loss from surgery.  I witnessed her at the front desk bent over leaning on th secretary's desk and Sharyn Lull asking if she needed a wheelchair.    She reported dizziness and weakness.  She is pale.  She is not diaphoretic.  No LOC.  She is helped into a wheelchair and brought to the infusion area.  She notes some low-grade nausea that is controlled with Zofran at home.  She denies any vomiting.  We helped her into a chemo-bed.   Exam: Vitals - 1 value per visit [id number]  SYSTOLIC 123456  DIASTOLIC 47  Pulse 70  Temperature 98.1  Respirations 18   Gen: Pale, no distress, not looking well. HEENT: Atraumatic, normocephalic. Skin: Warm and dry.  Pale Cardiac: RRR without murmur, rub, or gallop Lungs: CTA B/L Neuro: No focal deficits   Assessment: 1. Dizziness 2. Pale 3. Symptomatic anemia 4. Nausea    Plan: 1. Repeat labs: CBC diff, CMET. 2. I have ordered a D-dimer, given her symptoms and recent surgery in the setting of Stage IV disease  If elevated, then I will pursue CT angio of chest to evaluate for PE. 3. EKG is ordered. 4. Compazine PO today in clinic.  She took Zofran prior to appointment today.  5. D-dimer return elevated as would be expected in the setting of Stage IV malignancy and recent surgery.  EKG is WNL with NSR.  CBC diff, CMET is unimpressive.  Will not pursue CT imaging of chest at this time given clinical picture and normal EKG. 6. HOLD Lynparza x 1 week. 7. MRI brain within 1 week to verify no metastatic disease, given her Stage IV breast cancer.  I will give her Xanax due to her claustrophobia. 8. Return in 1 week for follow-up. 9. She is advised to report to ED with worsening symptoms.  Patient and plan discussed with Dr. Twana First and she is in agreement with the aforementioned.   Kylil Swopes, PA-C 09/18/2016 11:14 AM

## 2016-09-19 LAB — TYPE AND SCREEN
ABO/RH(D): A POS
Antibody Screen: NEGATIVE
Unit division: 0

## 2016-09-21 ENCOUNTER — Other Ambulatory Visit (HOSPITAL_COMMUNITY): Payer: BLUE CROSS/BLUE SHIELD

## 2016-09-24 ENCOUNTER — Encounter (HOSPITAL_BASED_OUTPATIENT_CLINIC_OR_DEPARTMENT_OTHER): Payer: BLUE CROSS/BLUE SHIELD | Admitting: Oncology

## 2016-09-24 ENCOUNTER — Encounter (HOSPITAL_COMMUNITY): Payer: Self-pay | Admitting: Oncology

## 2016-09-24 ENCOUNTER — Ambulatory Visit (HOSPITAL_COMMUNITY)
Admission: RE | Admit: 2016-09-24 | Discharge: 2016-09-24 | Disposition: A | Discharge status: Home / Self Care | Payer: BLUE CROSS/BLUE SHIELD | Source: Ambulatory Visit | Attending: Oncology | Admitting: Oncology

## 2016-09-24 VITALS — BP 101/59 | HR 84 | Temp 97.3°F | Resp 16 | Ht 64.0 in | Wt 128.0 lb

## 2016-09-24 DIAGNOSIS — C210 Malignant neoplasm of anus, unspecified: Secondary | ICD-10-CM | POA: Diagnosis not present

## 2016-09-24 DIAGNOSIS — C50912 Malignant neoplasm of unspecified site of left female breast: Secondary | ICD-10-CM

## 2016-09-24 DIAGNOSIS — C7951 Secondary malignant neoplasm of bone: Secondary | ICD-10-CM | POA: Diagnosis not present

## 2016-09-24 DIAGNOSIS — R42 Dizziness and giddiness: Secondary | ICD-10-CM | POA: Diagnosis not present

## 2016-09-24 DIAGNOSIS — C50919 Malignant neoplasm of unspecified site of unspecified female breast: Secondary | ICD-10-CM | POA: Insufficient documentation

## 2016-09-24 MED ORDER — BUPROPION HCL ER (XL) 150 MG PO TB24: 150.0000 mg | ORAL_TABLET | Freq: Every day | ORAL | 1 refills | Status: DC

## 2016-09-24 MED ORDER — GADOBENATE DIMEGLUMINE 529 MG/ML IV SOLN
11.0000 mL | Freq: Once | INTRAVENOUS | Status: AC | PRN
  Administered 2016-09-24: 11 mL via INTRAVENOUS

## 2016-09-24 NOTE — Assessment & Plan Note (Addendum)
Stage IV breast cancer ER/PR positive, HER-2/neu negative. BRCA2 POSITIVE.  History of stage IIIc left breast cancer in 2009 treated with neoadjuvant chemotherapy consisting of epirubicin and Cytoxan followed by 4 cycles of docetaxel from 04/16/2008-08/13/2008 with the initiation of Herceptin for 52 weeks at the start of docetaxel. She then underwent a left mastectomy on 08/30/2008 showing persistent disease in the breast and lymph nodes. This was followed by radiation therapy from 09/17/2008-11/26/2008 to the left chest wall, supraclavicular, and axillary node areas. She then moved on to antiestrogen therapy with letrozole (11/27/2008-04/20/2011). She was found to have recurrent breast cancer on supraclavicular lymph node biopsy on 03/30/2011 leading to a change in antiestrogen therapy to tamoxifen (04/21/2011-06/04/2014). Pet imaging on 05/25/2014 demonstrated progression of left submandibular lymph node, mediastinal lymph node, and probable lung involvement. This was followed by a biopsy on 06/13/2014 of a left supraclavicular mass that did reveal metastatic carcinoma consistent with breast primary. She was then treated with carboplatin and Taxol for 6 cycles with an excellent response (06/25/2014-10/09/2014). She was then transition back to hormone therapy consisting of Ibrance and Faslodex (11/13/2014-06/10/2015). This was complicated by grade 3 neutropenia with cycle #2 resulting in a dose reduction of Ibrance to 100 mg for cycle 3. Unfortunately, on 06/10/2015, she was noted to have progression of disease in the right mid cervical lymph node over a 4 day period that was PET avid. Therapy was therefore changed to exemestane and everolimus (67/[birth year]/1937-90/24/0973) that was complicated by an adverse reaction consisting of diffuse pneumonitis. This regimen was therefore discontinued. She then transitioned back to systemic chemotherapy consisting of cisplatin every 14 days (08/23/2015-12/23/2015). She was put on a  drug holiday beginning on 12/23/2015 in preparation for a trip to [place] on 01/25/2016.  She was on Xeloda maintenance in a 7 day on and 7 day off fashion beginning on 04/20/2016, but progression of disease was noted in cervical lymph node.  She is NOW on [place] beginning on 08/26/2016.  Oncology history is updated.  She has seen Dr. Mauro Kaufmann Muss in the past for second opinion.  Labs next week: CBC diff, CMET.     I personally reviewed and went over radiographic studies with the patient.  The results are noted within this dictation.  MRI brain is negative for any intracranial metastases.  I presume her fatigue and tiredness is multifactorial secondary to her recent APR for anal cancer.  Additionally, she was on chemotherapy following the surgery.  She is now off of the [place] since last week.  Her shortness of breath with exertion is concerning to me.  This may be multifactorial, but I will ask nursing to call the patient tomorrow to check on this.  If persistent, I think scheduling her for CT angiogram of chest would be prudent to rule out PE.  She denies any pleuritic chest pain and her vitals are stable.  This has been a symptom for the last few weeks.  I have prescribed Wellbutrin 150 mg daily in the morning.  She can continue with Remeron at bedtime.  She will return next week for follow-up.  We will see how she is doing at that point in time.  If she significantly improved, we can consider restarting Lynparza at a reduced dose (200 mg twice daily).  She asks about reinstitution of Cisplatin.  She did not fail this intervention; it was placed on hold for a drug holiday as the patient went to [place] for a trip.  Following return from this trip, she  was diagnosed with anal cancer which led to discontinuation of cisplatin and pursuit of treatment for anal cancer.  I discussed this with her previous oncologist who reports uncertainty about durability of response of reinstitution of this  medication.  Instead, dose reduction of [place] was recommended.  We can certainly consider a second opinion with [facility] or Emilio Math in the future.

## 2016-09-24 NOTE — Patient Instructions (Signed)
[name] Discharge Instructions  RECOMMENDATIONS MADE BY THE CONSULTANT AND ANY TEST RESULTS WILL BE SENT TO YOUR REFERRING PHYSICIAN.  You were seen today by Kirby Crigler PA-C. Rx for Welbutrin sent to your pharmacy. Return in 1 week for follow. Keep scheduled follow up visit on 3/1   Thank you for choosing [place] at [facility] to provide your oncology and hematology care.  To afford each patient quality time with our provider, please arrive at least 15 minutes before your scheduled appointment time.    If you have a lab appointment with the [facility] please come in thru the  Main Entrance and check in at the main information desk  You need to re-schedule your appointment should you arrive 10 or more minutes late.  We strive to give you quality time with our providers, and arriving late affects you and other patients whose appointments are after yours.  Also, if you no show three or more times for appointments you may be dismissed from the clinic at the providers discretion.     Again, thank you for choosing [facility].  Our hope is that these requests will decrease the amount of time that you wait before being seen by our physicians.       _____________________________________________________________  Should you have questions after your visit to [facility], please contact our office at [phone] between the hours of 8:30 a.m. and 4:30 p.m.  Voicemails left after 4:30 p.m. will not be returned until the following business day.  For prescription refill requests, have your pharmacy contact our office.       Resources For Cancer Patients and their Caregivers ? American Cancer Society: Can assist with transportation, wigs, general needs, runs Look Good Feel Better.        947-241-7023 ? Cancer Care: Provides financial assistance, online support groups, medication/co-pay assistance.   1-800-813-HOPE 574-608-8021) ? [facility] Assists [place] cancer patients and their families through emotional , educational and financial support.  330 139 1041 ? [place] DSS Where to apply for food stamps, Medicaid and utility assistance. 337-679-4700 ? RCATS: Transportation to medical appointments. (351) 183-5000 ? Social Security Administration: May apply for disability if have a Stage IV cancer. 6152118356 ([phone] ? [employer], Disability and Transit Services: Assists with nutrition, care and transit needs. [place] Support Programs: @10RELATIVEDAYS @ > Cancer Support Group  2nd Tuesday of the month 1pm-2pm, Journey Room  > Creative Journey  3rd Tuesday of the month 1130am-1pm, Journey Room  > Look Good Feel Better  1st Wednesday of the month 10am-12 noon, Journey Room (Call [place] to register [phone])

## 2016-09-24 NOTE — Progress Notes (Signed)
Jennifer Neighbors, MD [place] [address]  Adenocarcinoma of left breast [facility]) - Plan: buPROPion (WELLBUTRIN XL) 150 MG 24 hr tablet  Bone metastasis (HCC)  Squamous cell carcinoma of anus (HCC)  CURRENT THERAPY: Olaparib 300 mg BID beginning on 08/26/2016 on HOLD.  INTERVAL HISTORY: [name] 66 y.o. female returns for followup of Stage IV breast cancer ER/PR positive, HER-2/neu negative. BRCA2 POSITIVE. History of stage IIIc left breast cancer in 2009 treated with neoadjuvant chemotherapy consisting of epirubicin and Cytoxan followed by 4 cycles of docetaxel from 04/16/2008-08/13/2008 with the initiation of Herceptin for 52 weeks at the start of docetaxel. She then underwent a left mastectomy on 08/30/2008 showing persistent disease in the breast and lymph nodes. This was followed by radiation therapy from 09/17/2008-11/26/2008 to the left chest wall, supraclavicular, and axillary node areas. She then moved on to antiestrogen therapy with letrozole (11/27/2008-04/20/2011). She was found to have recurrent breast cancer on supraclavicular lymph node biopsy on 03/30/2011 leading to a change in antiestrogen therapy to tamoxifen (04/21/2011-06/04/2014). Pet imaging on 05/25/2014 demonstrated progression of left submandibular lymph node, mediastinal lymph node, and probable lung involvement. This was followed by a biopsy on 06/13/2014 of a left supraclavicular mass that did reveal metastatic carcinoma consistent with breast primary. She was then treated with carboplatin and Taxol for 6 cycles with an excellent response (06/25/2014-10/09/2014). She was then transition back to hormone therapy consisting of Ibrance and Faslodex (11/13/2014-06/10/2015). This was complicated by grade 3 neutropenia with cycle #2 resulting in a dose reduction of Ibrance to 100 mg for cycle 3. Unfortunately, on 06/10/2015, she was noted to have progression of disease in the right mid cervical lymph  node over a 4 day period that was PET avid. Therapy was therefore changed to exemestane and everolimus (36/14/4315-40/03/6760) that was complicated by an adverse reaction consisting of diffuse pneumonitis. This regimen was therefore discontinued. She then transitioned back to systemic chemotherapy consisting of cisplatin every 14 days (08/23/2015-12/23/2015). She was put on a drug holiday beginning on 12/23/2015 in preparation for a trip to [place] on 01/25/2016. She was on Xeloda maintenance in a 7 day on and 7 day off fashion beginning on 04/20/2016, but progression of disease was noted in cervical lymph node.  She is NOW on [place] beginning on 08/26/2016. AND squamous cell carcinoma of anus, HPV POSITIVE, S/P XRT with Xeloda concomitantly in the neoadjuvant setting (02/07/2016- 03/10/2016).  S/P APR by Dr. Leighton Ruff on 9/50/9326    Adenocarcinoma of left breast   04/09/2008 Pathology Results    Positive for breast cancer, ER 100%, PR 5%, HER-2/neu POSITIVE.      04/09/2008 Procedure    Biopsy of left axillary node mass       04/16/2008 - 08/13/2008 Chemotherapy    Dose dense epirubicin/Cytoxan 4 cycles followed by docetaxel 4 cycles. Herceptin initiated with docetaxel for a total of 52 weeks.      08/30/2008 Definitive Surgery    Left mastectomy revealing persistence of disease in the breast and lymph nodes.      09/17/2008 - 11/26/2008 Radiation Therapy    Radiation to left chest wall, supraclavicular, and axillary nodal areas.      11/27/2008 - 04/20/2011 Anti-estrogen oral therapy    Letrozole      03/30/2011 Procedure    Left supraclavicular lymph node biopsy      03/30/2011 Pathology Results    Recurrent breast cancer, invasive ductal type, ER 100%,  PR 40%, HER-2 NEGATIVE      04/21/2011 - 06/04/2014 Anti-estrogen oral therapy    Tamoxifen      05/25/2014 PET scan    Progression in left submandibular lymph node, mediastinal lymph nodes, and probable lung involvement.       05/25/2014 Progression    PET scan demonstrates progression of disease.      06/13/2014 Procedure    Left supraclavicular mass biopsy      06/13/2014 Pathology Results    Metastatic carcinoma consistent with breast primary, involving skeletal muscle, perineural invasion, LVI, no lymphoid tissue identified, ER 98%, PR negative, HER-2 negative      06/25/2014 - 10/09/2014 Chemotherapy    Carboplatin/Taxol 6 cycles with excellent response      11/06/2014 - 06/10/2015 Chemotherapy    Palbociclib and Faslodex      01/08/2015 Adverse Reaction    Grade 3 neutropenia with cycle #2 of Palbociclib.      01/08/2015 Treatment Plan Change    Palbociclib dose reduced to 100 mg for cycle #3.      06/10/2015 Progression    Right cervical lymph node progression clinically developed over 4 days.  This lymph node was positive on PET scan in September 2016 and at that time was approximately 8 mm with possible progression in a single bone metastasis.      06/17/2015 Procedure    Biopsy of right cervical lymph node      06/17/2015 Pathology Results    Genetic sequencing performed revealing BRCA2 positivity      07/02/2015 - 08/01/2015 Chemotherapy    Exemestane/everolimus, 25 mg/ 5 mg x 7 days then increasing to 10 mg daily.      08/01/2015 PET scan    Diffuse pneumonitis.        08/01/2015 Adverse Reaction    Pneumonitis secondary to drug reaction from everolimus.      08/23/2015 - 12/23/2015 Chemotherapy    Cisplatin 40 mg/m every other week      11/15/2015 PET scan    Interval resolution of hypermetabolic airspace opacities in the lungs which were likely inflammatory. Abnormal high activity at the anal rectal junction. Very low grade residual activity at the site of L3 metastatic lesion.      12/23/2015 Treatment Plan Change    Holiday from treatment in preparation for trip to [place].      04/20/2016 - 07/27/2016 Chemotherapy    XELODA 1300 mg po bid 7 days on/ 7 days off        05/15/2016 PET scan    1. Partial metabolic response at the hypermetabolic anorectal junction malignancy. 2. No hypermetabolic metastatic disease. Lumbar vertebral sclerotic osseous lesions are non hypermetabolic. 3. Hypermetabolism associated with acute/early subacute fractures of the right clavicle and right anterior second through fifth ribs, without associated bone lesions. 4. Aortic atherosclerosis.  Three-vessel coronary atherosclerosis.      07/24/2016 PET scan    1. Intense metabolic activity associated mucosal thickening in the anal rectal junction is consistent with residual carcinoma. Mild increase in metabolic activity. 2. No hypermetabolic lymph nodes in the pelvis. 3. No evidence distant metastatic disease. 4. No evidence breast cancer recurrence.      07/27/2016 Procedure    1. Lymph node, biopsy, Left Posterior Neck - METASTATIC CARCINOMA. - SEE MICROSCOPIC DESCRIPTION 2. Skin , and Subcutaneous Left Posterior Neck - BENIGN SKIN AND SUBCUTANEOUS ADIPOSE TISSUE. - NO EVIDENCE OF MALIGNANCY.      07/27/2016 Pathology Results    HER2  NEGATIVE, ER/PR POSITIVE      08/26/2016 -  Chemotherapy    Olaparib 300 mg BID beginning on 08/26/2016      09/18/2016 Treatment Plan Change    Olaparib on HOLD due to recent surgery and feeling poor.      09/24/2016 Imaging    MRI brain- No acute intracranial abnormality or intracranial metastatic disease.       Squamous cell carcinoma of anus ([place])   12/31/2015 Procedure    Colonoscopy by Dr. Britta Mccreedy      12/31/2015 Pathology Results    Invasive squamous cell carcinoma approximate 4 cm mass posterior wall of rectum      01/13/2016 Treatment Plan Change    Dr. Leighton Ruff consultation- no surgery.  Recommend chemoXRT      01/16/2016 Pathology Results    HPV POSITIVE      02/07/2016 - 03/10/2016 Chemotherapy    Xeloda 1450 mg PO BID 'Sunday PM- Friday AM during XRT      02/07/2016 - 03/10/2016 Radiation Therapy     Definitive XRT      02/19/2016 Treatment Plan Change    Xeloda on hold due to increased GERD symtpoms.  PPI dose increased.  Will restart Xeloda tomorrow, 02/21/2016.      02/23/2016 Treatment Plan Change    Xeloda dose decreased to 1150 mg PO BID Sunday PM- Friday AM during XRT      05/15/2016 PET scan    1. Partial metabolic response at the hypermetabolic anorectal junction malignancy.       12' /15/2017 PET scan    1. Intense metabolic activity associated mucosal thickening in the anal rectal junction is consistent with residual carcinoma. Mild increase in metabolic activity. 2. No hypermetabolic lymph nodes in the pelvis. 3. No evidence distant metastatic disease. 4. No evidence breast cancer recurrence.      09/01/2016 Surgery    ABDOMINAL PERINEAL RESECTION by Dr. Leighton Ruff      7/40/8144 Pathology Results    Diagnosis 1. Anus, biopsy - INVASIVE SQUAMOUS CELL CARCINOMA. 2. Colon, resection margin (donut), additional posterior - BENIGN FIBROFATTY AND VASCULAR SOFT TISSUE. - BENIGN MUSCLE PRESENT. - NO TUMOR SEEN. 3. Colon, segmental resection for tumor, rectum and anus - INVASIVE WELL TO MODERATELY DIFFERENTIATED SQUAMOUS CELL CARCINOMA, SPANNING 4.7 CM IN GREATEST DIMENSION. - TUMOR INVADES THROUGH BOWEL AND INTO UNDERLYING FIBROFATTY AND VASCULAR SOFT TISSUE AND INVOLVES POSTERIOR RADIAL NON-PERITONEALIZED MARGIN. - PROXIMAL AND DISTAL MARGINS ARE NEGATIVE. - THIRTEEN BENIGN LYMPH NODES WITH NO TUMOR SEEN (0/13). - SEE ONCOLOGY TEMPLATE.       She continues to be weak and tired.  She notes shortness of breath on exertion.  She reports decreased stamina.  She denies any nausea or vomiting.  She is certainly improved compared to last week.  Last week, she required a wheelchair to get to the clinic.  Today she was able to walk in on her own accord.  Her skin complexion and appearance is much improved.  She is accompanied by a friend.  She reports that she is  not used to being dependent upon [place].  She is having a difficult time losing her independence.  She had an MRI of her brain today and utilize Xanax to help her with her anxiety associated with this.  Her MRI is negative.  She notes that the Xanax very helpful.  Review of Systems  Constitutional: Negative.  Negative for chills, fever and weight loss.  HENT:  Negative.   Eyes: Negative.   Respiratory: Positive for shortness of breath (on exertion). Negative for cough.   Cardiovascular: Negative for chest pain.  Gastrointestinal: Negative.  Negative for blood in stool, constipation, diarrhea, melena, nausea and vomiting.  Genitourinary: Negative.   Musculoskeletal: Negative.  Negative for falls.  Skin: Negative.   Neurological: Negative.   Endo/Heme/Allergies: Negative.   Psychiatric/Behavioral: Negative.     Past Medical History:  Diagnosis Date  . Adenocarcinoma of breast ([place]) 01/27/2011   stage IIIc breast cancer  . Bladder infection   . Bone metastasis ([place]) 01/17/2016  . Borderline hypertension   . Breast cancer ([place])   . Closed fracture of humerus sept 2013  . Depression   . Family history of adverse reaction to anesthesia    father remembered his surgery  . Fatigue   . GERD (gastroesophageal reflux disease)   . Hypertension   . 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 Osteoporosis 01/27/2011  . Pneumonia   . Port catheter in place 04/14/2013  . Post-mastectomy lymphedema syndrome   . 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 Reflux   . Retinal detachment   . 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 Swollen lymph nodes   . URI (upper respiratory infection) 02/17/12  . Wears glasses     Past Surgical History:  Procedure Laterality Date  . ABDOMINAL PERINEAL BOWEL RESECTION N/A 09/01/2016   Procedure: ABDOMINAL PERINEAL RESECTION PERMANENT COLOSTOMY;  Surgeon: Leighton Ruff, MD;  Location: [facility];  Service: General;  Laterality: N/A;  . biopsy of lymph node     super clavicle  . BTL    . CATARACT EXTRACTION    . cervical disc fusioni    . LYMPH NODE BIOPSY Right 06/19/2015   Procedure: RIGHT NECK LYMPH NODE EXCISION;  Surgeon: Rolm Bookbinder, MD;  Location: [facility];  Service: General;  Laterality: Right;  . MASS EXCISION Left 07/27/2016   Procedure: EXCISION OF POSTERIOR NECK MASS;  Surgeon: Rolm Bookbinder, MD;  Location: [facility];  Service: General;  Laterality: Left;  Marland Kitchen MASTECTOMY MODIFIED RADICAL     left breast  . Port- a-cath insertion    . retinal detachment and repair      Family History  Problem Relation Age of Onset  . Hypertension Mother   . Heart disease Mother     atrial fib  . Heart disease Father   . Other Father     glaucoma    Social History   Social History  . Marital status: Divorced    Spouse name: N/A  . Number of children: N/A  . Years of education: N/A   Social History Main Topics  . Smoking status: Never Smoker  . Smokeless tobacco: Never Used  . Alcohol use 1.8 oz/week    3 Glasses of wine per week     Comment: Drinks 3 drinks very other week  . Drug use: Yes    Types: Marijuana     Comment: During chemo, none over 1 Year  . Sexual activity: No   Other Topics Concern  . None   Social History Narrative  . None     PHYSICAL EXAMINATION  ECOG PERFORMANCE STATUS: 1 - Symptomatic but completely ambulatory  Vitals:   09/24/16 1621  BP: (!) 101/59  Pulse: 84  Resp: 16  Temp: 97.3 F (36.3 C)  GENERAL:alert, no distress, cooperative, smiling and fatigued/tired, accompanied by a friend. SKIN: skin color, texture, turgor are normal, no rashes or significant lesions HEAD: Normocephalic, No masses, lesions, tenderness or abnormalities EYES: normal, EOMI, Conjunctiva are pink and non-injected EARS: External ears normal OROPHARYNX:lips, buccal mucosa, and  tongue normal and mucous membranes are moist  NECK: supple, trachea midline, left posterior cervical chain lymph nodes measuring 5 mm in size. LYMPH:  As above. BREAST:not examined LUNGS: clear to auscultation  HEART: regular rate & rhythm, no murmurs, no gallops, S1 normal and S2 normal ABDOMEN:abdomen soft and normal bowel sounds BACK: Back symmetric, no curvature. EXTREMITIES:less then 2 second capillary refill, no joint deformities, effusion, or inflammation, no skin discoloration, no cyanosis  NEURO: alert & oriented x 3 with fluent speech, no focal motor/sensory deficits, gait normal    LABORATORY DATA: CBC    Component Value Date/Time   WBC 5.8 09/18/2016 0845   RBC 2.65 (L) 09/18/2016 0845   HGB 9.5 (L) 09/18/2016 0845   HCT 28.2 (L) 09/18/2016 0845   PLT 332 09/18/2016 0845   MCV 106.4 (H) 09/18/2016 0845   MCH 35.8 (H) 09/18/2016 0845   MCHC 33.7 09/18/2016 0845   RDW 14.7 09/18/2016 0845   LYMPHSABS 0.8 09/18/2016 0845   MONOABS 0.5 09/18/2016 0845   EOSABS 0.1 09/18/2016 0845   BASOSABS 0.0 09/18/2016 0845      Chemistry      Component Value Date/Time   NA 138 09/18/2016 0845   K 3.7 09/18/2016 0845   CL 106 09/18/2016 0845   CO2 26 09/18/2016 0845   BUN 12 09/18/2016 0845   CREATININE 1.04 (H) 09/18/2016 0845      Component Value Date/Time   CALCIUM 9.1 09/18/2016 0845   ALKPHOS 81 09/18/2016 0845   AST 20 09/18/2016 0845   ALT 17 09/18/2016 0845   BILITOT 0.3 09/18/2016 0845        PENDING LABS:   RADIOGRAPHIC STUDIES:  Mr Jeri Cos Wo Contrast  Result Date: 09/24/2016 CLINICAL DATA:  Breast cancer and weakness EXAM: MRI HEAD WITHOUT AND WITH CONTRAST TECHNIQUE: Multiplanar, multiecho pulse sequences of the brain and surrounding structures were obtained without and with intravenous contrast. CONTRAST:  65m MULTIHANCE GADOBENATE DIMEGLUMINE 529 MG/ML IV SOLN COMPARISON:  Head CT 01/16/2015 Brain MR 05/27/2009 FINDINGS: Brain: No focal diffusion  restriction to indicate acute infarct. No intraparenchymal hemorrhage. There is minimal multifocal hyperintense T2-weighted signal within the periventricular white matter, which may be seen in the setting of migraine headaches or early chronic microvascular disease; however, it is also seen in normal patients of this age. No mass lesion or midline shift. No hydrocephalus or extra-axial fluid collection. The midline structures are normal. No age advanced or lobar predominant atrophy. No contrast-enhancing lesions. Vascular: Major intracranial arterial and venous sinus flow voids are preserved. No evidence of chronic microhemorrhage or amyloid angiopathy. Small left frontal developmental venous anomaly. Skull and upper cervical spine: The visualized skull base, calvarium, upper cervical spine and extracranial soft tissues are normal. Sinuses/Orbits: No fluid levels or advanced mucosal thickening. No mastoid effusion. Normal orbits. IMPRESSION: No acute intracranial abnormality or intracranial metastatic disease. Electronically Signed   By: KUlyses JarredM.D.   On: 09/24/2016 14:58     PATHOLOGY:    ASSESSMENT AND PLAN:  Adenocarcinoma of left breast Stage IV breast cancer ER/PR positive, HER-2/neu negative. BRCA2 POSITIVE.  History of stage IIIc left breast cancer in 2009 treated with neoadjuvant chemotherapy consisting of epirubicin and  Cytoxan followed by 4 cycles of docetaxel from 04/16/2008-08/13/2008 with the initiation of Herceptin for 52 weeks at the start of docetaxel. She then underwent a left mastectomy on 08/30/2008 showing persistent disease in the breast and lymph nodes. This was followed by radiation therapy from 09/17/2008-11/26/2008 to the left chest wall, supraclavicular, and axillary node areas. She then moved on to antiestrogen therapy with letrozole (11/27/2008-04/20/2011). She was found to have recurrent breast cancer on supraclavicular lymph node biopsy on 03/30/2011 leading to a change  in antiestrogen therapy to tamoxifen (04/21/2011-06/04/2014). Pet imaging on 05/25/2014 demonstrated progression of left submandibular lymph node, mediastinal lymph node, and probable lung involvement. This was followed by a biopsy on 06/13/2014 of a left supraclavicular mass that did reveal metastatic carcinoma consistent with breast primary. She was then treated with carboplatin and Taxol for 6 cycles with an excellent response (06/25/2014-10/09/2014). She was then transition back to hormone therapy consisting of Ibrance and Faslodex (11/13/2014-06/10/2015). This was complicated by grade 3 neutropenia with cycle #2 resulting in a dose reduction of Ibrance to 100 mg for cycle 3. Unfortunately, on 06/10/2015, she was noted to have progression of disease in the right mid cervical lymph node over a 4 day period that was PET avid. Therapy was therefore changed to exemestane and everolimus (66/01/3015-08/18/3233) that was complicated by an adverse reaction consisting of diffuse pneumonitis. This regimen was therefore discontinued. She then transitioned back to systemic chemotherapy consisting of cisplatin every 14 days (08/23/2015-12/23/2015). She was put on a drug holiday beginning on 12/23/2015 in preparation for a trip to [place] on 01/25/2016.  She was on Xeloda maintenance in a 7 day on and 7 day off fashion beginning on 04/20/2016, but progression of disease was noted in cervical lymph node.  She is NOW on [place] beginning on 08/26/2016.  Oncology history is updated.  She has seen Dr. Mauro Kaufmann Muss in the past for second opinion.  Labs next week: CBC diff, CMET.     I presume her fatigue and tiredness is multifactorial secondary to her recent APR for anal cancer.  Additionally, she was on chemotherapy following the surgery.  She is now off of the [place] since last week.  Her shortness of breath with exertion is concerning to me.  This may be multifactorial, but I will ask nursing to call the patient  tomorrow to check on this.  If persistent, I think scheduling her for CT angiogram of chest would be prudent to rule out PE.  She denies any pleuritic chest pain and her vitals are stable.  This has been a symptom for the last few weeks.  I have prescribed Wellbutrin 150 mg daily in the morning.  She can continue with Remeron at bedtime.  She will return next week for follow-up.  We will see how she is doing at that point in time.  If she significantly improved, we can consider restarting Lynparza at a reduced dose (200 mg twice daily).  She asks about reinstitution of Cisplatin.  She did not fail this intervention; it was placed on hold for a drug holiday as the patient went to [place] for a trip.  Following return from this trip, she was diagnosed with anal cancer which led to discontinuation of cisplatin and pursuit of treatment for anal cancer.  I discussed this with her previous oncologist who reports uncertainty about durability of response of reinstitution of this medication.  Instead, dose reduction of [place] was recommended.  We can certainly consider a second opinion with Mauro Kaufmann  Muss or Emilio Math in the future.  Bone metastasis (HCC) Bone metastases from breast cancer. On Xgeva therapy. She is also taking calcium and vitamin D.  Xgeva given on 09/09/2016.  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  S/P APR by Dr. Leighton Ruff on 3/81/8299, demonstrating a partial response to therapy, Stage IIA (T2N0M0).  Initial medical oncology recommendations provided by Dr. Tressie Stalker at [facility].  Dr. Tressie Stalker does dictate that there is a history of anal intercourse in the remote past.  Oncology history is up to date.    ORDERS PLACED FOR THIS ENCOUNTER: No orders of the defined types were placed in this encounter.   MEDICATIONS PRESCRIBED THIS ENCOUNTER: Meds ordered  this encounter  Medications  . buPROPion (WELLBUTRIN XL) 150 MG 24 hr tablet    Sig: Take 1 tablet (150 mg total) by mouth daily.    Dispense:  30 tablet    Refill:  1    Order Specific Question:   Supervising Provider    Answer:   Brunetta Genera [[id number]]    THERAPY PLAN:  Continue to [place].  When reinstituted, we should consider dose reduction to 200 mg twice daily.  All questions were answered. The patient knows to call the clinic with any problems, questions or concerns. We can certainly see the patient much sooner if necessary.  Patient and plan discussed with Dr. Twana First and she is in agreement with the aforementioned.   This note is electronically signed by: Doy Mince 09/24/2016 7:14 PM

## 2016-09-24 NOTE — Assessment & Plan Note (Signed)
Squamous cell carcinoma of anus, HPV POSITIVE, S/P XRT with Xeloda concomitantly in the neoadjuvant setting (02/07/2016- 03/10/2016).  S/P APR by Dr. Leighton Ruff on 0000000, demonstrating a partial response to therapy, Stage IIA (T2N0M0).  Initial medical oncology recommendations provided by Dr. Tressie Stalker at [facility].  Dr. Tressie Stalker does dictate that there is a history of anal intercourse in the remote past.  Oncology history is up to date.

## 2016-09-24 NOTE — Assessment & Plan Note (Signed)
Bone metastases from breast cancer. On Xgeva therapy. She is also taking calcium and vitamin D.  Xgeva given on 09/09/2016.

## 2016-09-25 ENCOUNTER — Telehealth (HOSPITAL_COMMUNITY): Payer: Self-pay | Admitting: Emergency Medicine

## 2016-09-25 NOTE — Telephone Encounter (Signed)
Left message for pt to call me

## 2016-09-25 NOTE — Telephone Encounter (Signed)
Pt went to walk about 200 yards and she was fine.  She then proceeded to sing and she got lightheaded and SOB.  She then cam back in and waited about 1 minute and counted her respirations and they were 24.  She said that this was much improved.  She will continue doing this test and see if it improves or gets worse.  If it gets worse she will call me and we can order her CT angio of her chest.  Before she states that she could not even walk 100 yards and she would have been painting and very SOB.

## 2016-09-30 NOTE — Progress Notes (Signed)
[name], [place] [id number]   CLINIC:  Medical Oncology/Hematology  PCP:  Wende Neighbors, MD [address] 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      04/09/2008 Procedure    Biopsy of left axillary node mass       04/16/2008 - 08/13/2008 Chemotherapy    Dose dense epirubicin/Cytoxan 4 cycles followed by docetaxel 4 cycles. Herceptin initiated with docetaxel for a total of 52 weeks.      08/30/2008 Definitive Surgery    Left mastectomy revealing persistence of disease in the breast and lymph nodes.      09/17/2008 - 11/26/2008 Radiation Therapy    Radiation to left chest wall, supraclavicular, and axillary nodal areas.      11/27/2008 - 04/20/2011 Anti-estrogen oral therapy    Letrozole      03/30/2011 Procedure    Left supraclavicular lymph node biopsy      03/30/2011 Pathology Results    Recurrent breast cancer, invasive ductal type, ER 100%, PR 40%, HER-2 NEGATIVE      04/21/2011 - 06/04/2014 Anti-estrogen oral therapy    Tamoxifen      05/25/2014 PET scan    Progression in left submandibular lymph node, mediastinal lymph nodes, and probable lung involvement.      05/25/2014 Progression    PET scan demonstrates progression of disease.      06/13/2014 Procedure    Left supraclavicular mass biopsy      06/13/2014 Pathology Results    Metastatic carcinoma consistent with breast primary, involving skeletal muscle, perineural invasion, LVI, no lymphoid tissue identified, ER 98%, PR negative, HER-2 negative      06/25/2014 - 10/09/2014 Chemotherapy   Carboplatin/Taxol 6 cycles with excellent response      11/06/2014 - 06/10/2015 Chemotherapy    Palbociclib and Faslodex      01/08/2015 Adverse Reaction    Grade 3 neutropenia with cycle #2 of Palbociclib.      01/08/2015 Treatment Plan Change    Palbociclib dose reduced to 100 mg for cycle #3.      06/10/2015 Progression    Right cervical lymph node progression clinically developed over 4 days.  This lymph node was positive on PET scan in September 2016 and at that time was approximately 8 mm with possible progression in a single bone metastasis.      06/17/2015 Procedure    Biopsy of right cervical lymph node      06/17/2015 Pathology Results    Genetic sequencing performed revealing BRCA2 positivity      07/02/2015 - 08/01/2015 Chemotherapy    Exemestane/everolimus, 25 mg/ 5 mg x 7 days then increasing to 10 mg daily.      08/01/2015 PET scan    Diffuse pneumonitis.        08/01/2015 Adverse Reaction    Pneumonitis secondary to drug reaction from everolimus.      08/23/2015 - 12/23/2015 Chemotherapy    Cisplatin 40 mg/m every other week      11/15/2015 PET scan    Interval resolution of hypermetabolic airspace opacities in  the lungs which were likely inflammatory. Abnormal high activity at the anal rectal junction. Very low grade residual activity at the site of L3 metastatic lesion.      12/23/2015 Treatment Plan Change    Holiday from treatment in preparation for trip to [place].      04/20/2016 - 07/27/2016 Chemotherapy    XELODA 1300 mg po bid 7 days on/ 7 days off       05/15/2016 PET scan    1. Partial metabolic response at the hypermetabolic anorectal junction malignancy. 2. No hypermetabolic metastatic disease. Lumbar vertebral sclerotic osseous lesions are non hypermetabolic. 3. Hypermetabolism associated with acute/early subacute fractures of the right clavicle and right anterior second through fifth ribs, without associated bone lesions. 4. Aortic  atherosclerosis.  Three-vessel coronary atherosclerosis.      07/24/2016 PET scan    1. Intense metabolic activity associated mucosal thickening in the anal rectal junction is consistent with residual carcinoma. Mild increase in metabolic activity. 2. No hypermetabolic lymph nodes in the pelvis. 3. No evidence distant metastatic disease. 4. No evidence breast cancer recurrence.      07/27/2016 Procedure    1. Lymph node, biopsy, Left Posterior Neck - METASTATIC CARCINOMA. - SEE MICROSCOPIC DESCRIPTION 2. Skin , and Subcutaneous Left Posterior Neck - BENIGN SKIN AND SUBCUTANEOUS ADIPOSE TISSUE. - NO EVIDENCE OF MALIGNANCY.      07/27/2016 Pathology Results    HER2 NEGATIVE, ER/PR POSITIVE      08/26/2016 -  Chemotherapy    Olaparib 300 mg BID beginning on 08/26/2016      09/18/2016 Treatment Plan Change    Olaparib on HOLD due to recent surgery and feeling poor.      09/24/2016 Imaging    MRI brain- No acute intracranial abnormality or intracranial metastatic disease.       Squamous cell carcinoma of anus ([place])   12/31/2015 Procedure    Colonoscopy by Dr. Britta Mccreedy      12/31/2015 Pathology Results    Invasive squamous cell carcinoma approximate 4 cm mass posterior wall of rectum      01/13/2016 Treatment Plan Change    Dr. Leighton Ruff consultation- no surgery.  Recommend chemoXRT      01/16/2016 Pathology Results    HPV POSITIVE      02/07/2016 - 03/10/2016 Chemotherapy    Xeloda 1450 mg PO BID _0 /15/2017 PET scan    1. Intense metabolic activity associated mucosal thickening in the anal rectal junction is consistent with residual carcinoma. Mild increase in metabolic activity. 2. No hypermetabolic lymph nodes in the pelvis. 3. No evidence distant metastatic disease. 4. No evidence breast cancer recurrence.      09/01/2016 Surgery    ABDOMINAL PERINEAL RESECTION by Dr. Leighton Ruff      3/38/2505 Pathology Results    Diagnosis 1. Anus, biopsy - INVASIVE SQUAMOUS CELL CARCINOMA. 2. Colon, resection margin (donut),  additional posterior - BENIGN FIBROFATTY AND VASCULAR SOFT TISSUE. - BENIGN MUSCLE PRESENT. - NO TUMOR SEEN. 3. Colon, segmental resection for tumor, rectum and anus - INVASIVE WELL TO MODERATELY DIFFERENTIATED SQUAMOUS CELL CARCINOMA, SPANNING 4.7 CM IN GREATEST DIMENSION. - TUMOR INVADES THROUGH BOWEL AND INTO UNDERLYING FIBROFATTY AND VASCULAR SOFT TISSUE AND INVOLVES POSTERIOR RADIAL NON-PERITONEALIZED MARGIN. - PROXIMAL AND DISTAL MARGINS ARE NEGATIVE. - THIRTEEN BENIGN LYMPH NODES WITH NO TUMOR SEEN (0/13). - SEE ONCOLOGY TEMPLATE.        HISTORY OF PRESENT ILLNESS:  (From Kirby Crigler, PA-C's last note on 09/24/16)     INTERVAL HISTORY:  Ms. Gruenhagen returns today for follow-up accompanied by her friend.  She feels like she is "continuing to be on the mend."  She has continued to hold the [place]  She reports several debilitating side effects that she attributes to the [place] including anemia, extreme fatigue, extreme depression/apathy, decreased appetite, nausea, and weakness.  She describes her quality of life after starting [place] and having her APR surgery to be "the worst I have ever felt in my life. I wasn't suicidal, but I didn't care if I didn't wake up. 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 I could not do anything except lay around and sleep; I've never felt that low before."     She was given a prescription for Wellbutrin XL by Kirby Crigler, PA-C at her last visit. She reports that she started taking it about 3 days ago.  Even prior to initiating the Wellbutrin, she feels like "I have more get up and go than I did a few weeks ago."  Remains on Remeron at bedtime.    She is concerned about increasing lymph node size to her left neck; this was previously biopsied and found to be metastatic breast cancer.  She is anxious "to get back on some kind of treatment before my breast cancer takes off again."  She is extremely reluctant to want to try Lonie Peak again given her side effects.    She has been managing her colostomy well and independently.  Denies any frank bleeding or dark stools in the ostomy bag. Her post-surgical pain is continuing to improve. Home health nursing has been out to her home a few times.  She has decreased her Fentanyl patch to 50 mcg; she feels like her pain is improving quite well.  Her rectal pain is what remains for her at this time. Denies any abdominal pain. She is due to see her surgeon, Dr. Leighton Ruff, sometime within the next couple of weeks.      REVIEW OF SYSTEMS:  Review of Systems  Constitutional: Positive for fatigue (improving ).  HENT:   Positive for lump/mass ("left neck lymph nodes getting bigger").   Eyes: Negative.   Respiratory: Negative.   Cardiovascular: Negative.  Negative for chest pain, leg swelling and palpitations.  Gastrointestinal: Positive for rectal pain. Negative for abdominal pain, blood in stool and constipation.  Endocrine: Negative.   Genitourinary: Negative.  Negative for dysuria and hematuria.   Musculoskeletal:       Recently sprained her right hand   Skin: Positive for wound (healing surgical wounds; home health helping manage).  Neurological: Positive for dizziness (some dizziness when going from sitting to standing position).  Hematological: Positive for adenopathy.  Psychiatric/Behavioral: Positive  for depression (improving ).     PAST MEDICAL/SURGICAL HISTORY:  Past Medical History:  Diagnosis Date  . Adenocarcinoma of breast ([facility]) 01/27/2011   stage IIIc breast cancer  . Bladder infection   . Bone metastasis ([facility]) 01/17/2016  . Borderline hypertension   . Breast cancer ([place])   . Closed fracture of humerus sept 2013  . Depression   . Family history of adverse reaction to anesthesia    father remembered his surgery  . Fatigue   . GERD (gastroesophageal reflux disease)   . Hypertension   . 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 Osteoporosis 01/27/2011  . Pneumonia   . Port catheter in place 04/14/2013  . Post-mastectomy lymphedema syndrome   . 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 Reflux   . Retinal detachment   . 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 Swollen lymph nodes   . URI (upper respiratory infection) 02/17/12  . Wears glasses    Past Surgical History:  Procedure Laterality Date  . ABDOMINAL PERINEAL BOWEL RESECTION N/A 09/01/2016   Procedure: ABDOMINAL PERINEAL RESECTION PERMANENT COLOSTOMY;  Surgeon: Leighton Ruff, MD;  Location: [facility];  Service: General;  Laterality: N/A;  . biopsy of lymph node     super clavicle  . BTL    . CATARACT EXTRACTION    . cervical disc fusioni    . LYMPH NODE BIOPSY Right 06/19/2015   Procedure: RIGHT NECK LYMPH NODE EXCISION;  Surgeon: Rolm Bookbinder, MD;  Location: [facility];  Service: General;  Laterality: Right;  . MASS EXCISION Left 07/27/2016   Procedure: EXCISION OF POSTERIOR NECK MASS;  Surgeon: Rolm Bookbinder, MD;  Location: [facility];  Service: General;  Laterality: Left;  Marland Kitchen MASTECTOMY MODIFIED RADICAL     left breast  . Port- a-cath insertion    . retinal detachment and repair       SOCIAL HISTORY:  Social History   Social History  . Marital status: Divorced    Spouse name: N/A  .  Number of children: N/A  . Years of education: N/A   Occupational History  . Not on file.   Social History Main Topics  . Smoking status: Never Smoker  . Smokeless tobacco: Never Used  . Alcohol use 1.8 oz/week    3 Glasses of wine per week     Comment: Drinks 3 drinks very other week  . Drug use: Yes    Types: Marijuana     Comment: During chemo, none over 1 Year  . Sexual activity: No   Other Topics Concern  . Not on file   Social History Narrative  . No narrative on file    FAMILY HISTORY:  Family History  Problem Relation Age of Onset  . Hypertension Mother   . Heart disease Mother     atrial fib  . Heart disease Father   . Other Father     glaucoma    CURRENT MEDICATIONS:  Outpatient Encounter Prescriptions as of 10/01/2016  Medication Sig Note  . Acetylcarnitine HCl (ACETYL L-CARNITINE PO) Take 1,000 mg by mouth daily.   Marland Kitchen ALPRAZolam (XANAX) 0.5 MG tablet Take 1 tablet 1 hours prior to MRI and may repeat in the waiting room.   Marland Kitchen buPROPion (WELLBUTRIN XL) 150 MG 24 hr tablet Take 1 tablet (150 mg total) by mouth daily.   Marland Kitchen CALCIUM-VITAMIN D PO Take 1 tablet by mouth 2 (two) times daily. 1200 mg /1600 international units daily   .  docusate sodium (COLACE) 100 MG capsule Take 2 capsules (200 mg total) by mouth 2 (two) times daily.   Marland Kitchen escitalopram (LEXAPRO) 10 MG tablet    . fentaNYL (DURAGESIC - DOSED MCG/HR) 50 MCG/HR Combine with 25 mcg patch and change every 3 days for total of 75 mcg   . HYDROcodone-acetaminophen (NORCO/VICODIN) 5-325 MG tablet Take 1-2 tablets by mouth every 4 (four) hours as needed for moderate pain.   . magnesium oxide (MAG-OX) 400 MG tablet Take 400 mg by mouth 2 (two) times daily.   . metoprolol succinate (TOPROL-XL) 25 MG 24 hr tablet TAKE 1 TABLET DAILY 05/05/2016: Received from: External Pharmacy  . mirtazapine (REMERON) 30 MG tablet Take 1 tablet (30 mg total) by mouth at bedtime. Take 15 mg for 3 days then increase to 30 mg (Patient  taking differently: Take 30 mg by mouth at bedtime. )   . nitrofurantoin (MACRODANTIN) 100 MG capsule Take 1 capsule (100 mg total) by mouth daily. (Patient taking differently: Take 100 mg by mouth at bedtime. )   . omeprazole (PRILOSEC) 20 MG capsule Take 20 mg by mouth daily as needed (heartburn).   Marland Kitchen omeprazole (PRILOSEC) 40 MG capsule Take 1 capsule (40 mg total) by mouth 2 (two) times daily. (Patient taking differently: Take 40 mg by mouth daily as needed (heartburn). )   . ondansetron (ZOFRAN) 8 MG tablet Take 1 tablet (8 mg total) by mouth every 8 (eight) hours as needed for nausea or vomiting.   . vitamin B-12 (CYANOCOBALAMIN) 1000 MCG tablet Take 1,000 mcg by mouth daily.   . fentaNYL (DURAGESIC - DOSED MCG/HR) 12 MCG/HR Place 1 patch (12.5 mcg total) onto the skin every 3 (three) days. (Patient not taking: Reported on 10/01/2016)   . fentaNYL (DURAGESIC - DOSED MCG/HR) 25 MCG/HR patch Combine with 50 mcg patch for total of 75 mcg, change every 3 days (Patient not taking: Reported on 10/01/2016)    No facility-administered encounter medications on file as of 10/01/2016.     ALLERGIES:  Allergies  Allergen Reactions  . Ciprofloxacin Other (See Comments)    tendonitis     PHYSICAL EXAM:  ECOG Performance status: 2 - Symptomatic, requires occasional assistance.   Vitals:   10/01/16 1106  BP: (!) 131/40  Pulse: 82  Resp: 16  Temp: 97.5 F (36.4 C)   Filed Weights   10/01/16 1106  Weight: 130 lb 8 oz (59.2 kg)    Physical Exam  Constitutional: She is oriented to person, place, and time.  Thin female in no distress   HENT:  Head: Normocephalic.  Mouth/Throat: Oropharynx is clear and moist. No oropharyngeal exudate.  Eyes: Conjunctivae are normal. Pupils are equal, round, and reactive to light. No scleral icterus.  Neck: Normal range of motion.  Cardiovascular: Normal rate, regular rhythm and normal heart sounds.   Pulmonary/Chest: Effort normal and breath sounds normal.  No respiratory distress.  Abdominal: Soft. Bowel sounds are normal.  (L) colostomy in place   Musculoskeletal: Normal range of motion. She exhibits tenderness (R) hand/right tenderness given recent strain/sprain.  Lymphadenopathy:    She has cervical adenopathy (postauricular/posterior cervical lymphadenopathy present along lymph node chain; about 4-5 palpable lymph nodes in this area ).       Right: No supraclavicular adenopathy present.       Left: No supraclavicular adenopathy present.  Neurological: She is alert and oriented to person, place, and time. No cranial nerve deficit. Gait normal.  Skin: Skin is warm and  dry. No rash noted. There is pallor.  Psychiatric: Memory, affect and judgment normal.  Mildly flat affect   Nursing note and vitals reviewed.    LABORATORY DATA:  I have reviewed the labs as listed.  CBC    Component Value Date/Time   WBC 5.8 09/18/2016 0845   RBC 2.65 (L) 09/18/2016 0845   HGB 9.5 (L) 09/18/2016 0845   HCT 28.2 (L) 09/18/2016 0845   PLT 332 09/18/2016 0845   MCV 106.4 (H) 09/18/2016 0845   MCH 35.8 (H) 09/18/2016 0845   MCHC 33.7 09/18/2016 0845   RDW 14.7 09/18/2016 0845   LYMPHSABS 0.8 09/18/2016 0845   MONOABS 0.5 09/18/2016 0845   EOSABS 0.1 09/18/2016 0845   BASOSABS 0.0 09/18/2016 0845   CMP Latest Ref Rng & Units 09/18/2016 09/14/2016 09/08/2016  Glucose 65 - 99 mg/dL 112(H) 127(H) 119(H)  BUN 6 - 20 mg/dL _0 Creatinine 0.44 - 1.00 mg/dL 1.04(H) 0.98 1.13(H)  Sodium 135 - 145 mmol/L 138 138 137  Potassium 3.5 - 5.1 mmol/L 3.7 3.8 4.2  Chloride 101 - 111 mmol/L 106 108 102  CO2 22 - 32 mmol/L _1 Calcium 8.9 - 10.3 mg/dL 9.1 8.1(L) 9.2  Total Protein 6.5 - 8.1 g/dL 6.8 6.3(L) 6.6  Total Bilirubin 0.3 - 1.2 mg/dL 0.3 0.5 0.4  Alkaline Phos 38 - 126 U/L 81 58 62  AST 15 - 41 U/L 20 19 49(H)  ALT 14 - 54 U/L 17 20 56(H)    PENDING LABS:    DIAGNOSTIC IMAGING:  Most recent PET scan: 07/24/16    PATHOLOGY:  Left  posterior neck lymph node biopsy: 07/27/16    APR surgical path: 09/01/16     ASSESSMENT & PLAN:   Stage IV metastatic breast cancer, BRCA2+:  -Non-PET avid lymphadenopathy in the neck. Recent left posterior cervical lymph node biopsy positive for metastatic breast cancer. Lonie Peak on hold given patient's adverse side effects and recent APR surgery for anal cancer.  Patient is very reluctant to resume Lynparza at previous dose or with dose-reduction.  She previously tolerated Cisplatin well; "I felt great on the Cisplatin and I felt like the disease was under good control."   -Patient's case discussed with Dr. Talbert Cage; she also briefly met the patient today as well.   -Per Dr. Laverle Patter recommendation, we will get short-interval PET scan to evaluate the current status of her disease. Then the patient will return to cancer center to discuss these results and develop treatment plan.  We also discussed the recommendation of seeking a 2nd opinion at an academic institution, like [facility], to see Dr. Erline Levine for the potential of other treatment options. The patient agrees with this plan and we will facilitate this referral for her.    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 Leighton Ruff. She is managing colostomy care well.  -Follow-up with Dr. Marcello Moores, as directed.   Depression:  -Continue Wellbutrin XL and Remeron     Dispo:  -Short-interval PET scan within the next week; then return to cancer center to review results with Dr. Talbert Cage and discuss interval treatment options while waiting on referral to [facility].    All questions were answered to patient's stated satisfaction. Encouraged patient to call with any new concerns or questions before her next visit to the cancer center and we can certain see her  sooner, if needed.    Plan of care discussed with  Dr. Talbert Cage, who agrees with the above aforementioned.    Orders placed this encounter:  Orders Placed This Encounter  Procedures  . NM PET Image Restag (PS) Skull Base To Thigh      Mike Craze, NP [place] [phone]

## 2016-10-01 ENCOUNTER — Telehealth (HOSPITAL_COMMUNITY): Payer: Self-pay | Admitting: Adult Health

## 2016-10-01 ENCOUNTER — Encounter (HOSPITAL_BASED_OUTPATIENT_CLINIC_OR_DEPARTMENT_OTHER): Payer: BLUE CROSS/BLUE SHIELD | Admitting: Adult Health

## 2016-10-01 ENCOUNTER — Encounter (HOSPITAL_COMMUNITY): Payer: Self-pay | Admitting: Adult Health

## 2016-10-01 VITALS — BP 131/40 | HR 82 | Temp 97.5°F | Resp 16 | Wt 130.5 lb

## 2016-10-01 DIAGNOSIS — C210 Malignant neoplasm of anus, unspecified: Secondary | ICD-10-CM | POA: Diagnosis not present

## 2016-10-01 DIAGNOSIS — C7951 Secondary malignant neoplasm of bone: Secondary | ICD-10-CM

## 2016-10-01 DIAGNOSIS — Z1509 Genetic susceptibility to other malignant neoplasm: Secondary | ICD-10-CM

## 2016-10-01 DIAGNOSIS — Z1501 Genetic susceptibility to malignant neoplasm of breast: Secondary | ICD-10-CM | POA: Diagnosis not present

## 2016-10-01 DIAGNOSIS — C7989 Secondary malignant neoplasm of other specified sites: Secondary | ICD-10-CM | POA: Diagnosis not present

## 2016-10-01 DIAGNOSIS — Z1502 Genetic susceptibility to malignant neoplasm of ovary: Secondary | ICD-10-CM | POA: Diagnosis not present

## 2016-10-01 DIAGNOSIS — C50912 Malignant neoplasm of unspecified site of left female breast: Secondary | ICD-10-CM | POA: Diagnosis not present

## 2016-10-01 NOTE — Patient Instructions (Addendum)
[place] at [facility] Discharge Instructions  RECOMMENDATIONS MADE BY THE CONSULTANT AND ANY TEST RESULTS WILL BE SENT TO YOUR REFERRING PHYSICIAN.  Exam with Mike Craze, NP today. We are going to schedule you for a PET scan. Return to the clinic after the scan for review as well as you may start treatment that day.    Thank you for choosing [place] at [facility] to provide your oncology and hematology care.  To afford each patient quality time with our provider, please arrive at least 15 minutes before your scheduled appointment time.    If you have a lab appointment with the [facility] please come in thru the  Main Entrance and check in at the main information desk  You need to re-schedule your appointment should you arrive 10 or more minutes late.  We strive to give you quality time with our providers, and arriving late affects you and other patients whose appointments are after yours.  Also, if you no show three or more times for appointments you may be dismissed from the clinic at the providers discretion.     Again, thank you for choosing [facility].  Our hope is that these requests will decrease the amount of time that you wait before being seen by our physicians.       _____________________________________________________________  Should you have questions after your visit to [facility], please contact our office at [phone] between the hours of 8:30 a.m. and 4:30 p.m.  Voicemails left after 4:30 p.m. will not be returned until the following business day.  For prescription refill requests, have your pharmacy contact our office.       Resources For Cancer Patients and their Caregivers ? American Cancer Society: Can assist with transportation, wigs, general needs, runs Look Good Feel Better.        (820)709-0365 ? Cancer Care: Provides financial assistance, online support groups,  medication/co-pay assistance.  1-800-813-HOPE (425)819-2008) ? [place] Assists [place] cancer patients and their families through emotional , educational and financial support.  8784393016 ? [place] DSS Where to apply for food stamps, Medicaid and utility assistance. 985 657 7013 ? [facility]: Transportation to medical appointments. 684-109-6222 ? Social Security Administration: May apply for disability if have a Stage IV cancer. 930-189-8936 262-652-8748 ? [employer], Disability and Transit Services: Assists with nutrition, care and transit needs. [place] Support Programs: @10RELATIVEDAYS @ > Cancer Support Group  2nd Tuesday of the month 1pm-2pm, Journey Room  > Creative Journey  3rd Tuesday of the month 1130am-1pm, Journey Room  > Look Good Feel Better  1st Wednesday of the month 10am-12 noon, Journey Room (Call [place] to register [phone])

## 2016-10-01 NOTE — Progress Notes (Signed)
PET scan approved given concurrent anal cancer diagnosis with Stage IV breast cancer.  Although, NCCN Guidelines recommend surveillance with CT imaging, Ms. Ollar anal cancer was not seen on CT imaging at the time of diagnosis.  PET scan then approved via peer-to-peer with [employer] of [place].   Mike Craze, NP [place] [phone]

## 2016-10-01 NOTE — Telephone Encounter (Signed)
PET/CT Peer-to-Peer completed.   Authorization # from [place] [employer] [place] is: [id number].   Authorization valid until 10/30/16.    Message to be routed to [place].     Mike Craze, NP [place] [phone]

## 2016-10-02 ENCOUNTER — Encounter (HOSPITAL_COMMUNITY): Payer: Self-pay | Admitting: Lab

## 2016-10-02 NOTE — Progress Notes (Unsigned)
Referral sent to [facility] Dr Wetzel Bjornstad.  Faxed on 2/23.  Estill Bamberg to review records and will contact patient with appt.

## 2016-10-05 ENCOUNTER — Ambulatory Visit
Admission: RE | Admit: 2016-10-05 | Discharge: 2016-10-05 | Disposition: A | Discharge status: Home / Self Care | Payer: BLUE CROSS/BLUE SHIELD | Source: Ambulatory Visit | Attending: Adult Health | Admitting: Adult Health

## 2016-10-05 ENCOUNTER — Ambulatory Visit (HOSPITAL_COMMUNITY): Payer: BLUE CROSS/BLUE SHIELD

## 2016-10-05 DIAGNOSIS — R911 Solitary pulmonary nodule: Secondary | ICD-10-CM | POA: Diagnosis not present

## 2016-10-05 DIAGNOSIS — C7951 Secondary malignant neoplasm of bone: Secondary | ICD-10-CM | POA: Insufficient documentation

## 2016-10-05 DIAGNOSIS — S2243XA Multiple fractures of ribs, bilateral, initial encounter for closed fracture: Secondary | ICD-10-CM | POA: Insufficient documentation

## 2016-10-05 DIAGNOSIS — C50912 Malignant neoplasm of unspecified site of left female breast: Secondary | ICD-10-CM | POA: Diagnosis not present

## 2016-10-05 DIAGNOSIS — X58XXXA Exposure to other specified factors, initial encounter: Secondary | ICD-10-CM | POA: Insufficient documentation

## 2016-10-05 DIAGNOSIS — C210 Malignant neoplasm of anus, unspecified: Secondary | ICD-10-CM

## 2016-10-05 DIAGNOSIS — Z1509 Genetic susceptibility to other malignant neoplasm: Secondary | ICD-10-CM

## 2016-10-05 DIAGNOSIS — Z1501 Genetic susceptibility to malignant neoplasm of breast: Secondary | ICD-10-CM

## 2016-10-05 DIAGNOSIS — Z9889 Other specified postprocedural states: Secondary | ICD-10-CM | POA: Diagnosis not present

## 2016-10-05 DIAGNOSIS — R938 Abnormal findings on diagnostic imaging of other specified body structures: Secondary | ICD-10-CM | POA: Insufficient documentation

## 2016-10-05 DIAGNOSIS — I700 Atherosclerosis of aorta: Secondary | ICD-10-CM | POA: Diagnosis not present

## 2016-10-05 LAB — GLUCOSE, CAPILLARY: Glucose-Capillary: 56 mg/dL — ABNORMAL LOW (ref 65–99)

## 2016-10-05 MED ORDER — FLUDEOXYGLUCOSE F - 18 (FDG) INJECTION
13.1700 | Freq: Once | INTRAVENOUS | Status: AC | PRN
  Administered 2016-10-05: 13.17 via INTRAVENOUS

## 2016-10-06 ENCOUNTER — Ambulatory Visit (HOSPITAL_COMMUNITY): Payer: BLUE CROSS/BLUE SHIELD

## 2016-10-07 ENCOUNTER — Encounter (HOSPITAL_COMMUNITY): Payer: Self-pay

## 2016-10-07 ENCOUNTER — Encounter (HOSPITAL_COMMUNITY): Payer: BLUE CROSS/BLUE SHIELD

## 2016-10-07 ENCOUNTER — Encounter (HOSPITAL_COMMUNITY): Payer: Self-pay | Admitting: Emergency Medicine

## 2016-10-07 ENCOUNTER — Encounter (HOSPITAL_BASED_OUTPATIENT_CLINIC_OR_DEPARTMENT_OTHER): Payer: BLUE CROSS/BLUE SHIELD

## 2016-10-07 ENCOUNTER — Ambulatory Visit (HOSPITAL_COMMUNITY): Payer: BLUE CROSS/BLUE SHIELD

## 2016-10-07 ENCOUNTER — Encounter (HOSPITAL_BASED_OUTPATIENT_CLINIC_OR_DEPARTMENT_OTHER): Payer: BLUE CROSS/BLUE SHIELD | Admitting: Oncology

## 2016-10-07 VITALS — BP 131/62 | HR 79 | Temp 98.3°F | Resp 18 | Wt 128.6 lb

## 2016-10-07 DIAGNOSIS — Z1509 Genetic susceptibility to other malignant neoplasm: Secondary | ICD-10-CM

## 2016-10-07 DIAGNOSIS — Z5111 Encounter for antineoplastic chemotherapy: Secondary | ICD-10-CM

## 2016-10-07 DIAGNOSIS — C778 Secondary and unspecified malignant neoplasm of lymph nodes of multiple regions: Secondary | ICD-10-CM | POA: Diagnosis not present

## 2016-10-07 DIAGNOSIS — C50912 Malignant neoplasm of unspecified site of left female breast: Secondary | ICD-10-CM | POA: Diagnosis not present

## 2016-10-07 DIAGNOSIS — C218 Malignant neoplasm of overlapping sites of rectum, anus and anal canal: Secondary | ICD-10-CM

## 2016-10-07 DIAGNOSIS — Z170 Estrogen receptor positive status [ER+]: Secondary | ICD-10-CM

## 2016-10-07 DIAGNOSIS — Z1501 Genetic susceptibility to malignant neoplasm of breast: Secondary | ICD-10-CM

## 2016-10-07 DIAGNOSIS — C210 Malignant neoplasm of anus, unspecified: Secondary | ICD-10-CM

## 2016-10-07 DIAGNOSIS — C7951 Secondary malignant neoplasm of bone: Secondary | ICD-10-CM

## 2016-10-07 LAB — COMPREHENSIVE METABOLIC PANEL
ALT: 17 U/L (ref 14–54)
AST: 24 U/L (ref 15–41)
Albumin: 3.5 g/dL (ref 3.5–5.0)
Alkaline Phosphatase: 74 U/L (ref 38–126)
Anion gap: 5 (ref 5–15)
BUN: 12 mg/dL (ref 6–20)
CO2: 27 mmol/L (ref 22–32)
Calcium: 9 mg/dL (ref 8.9–10.3)
Chloride: 104 mmol/L (ref 101–111)
Creatinine, Ser: 0.92 mg/dL (ref 0.44–1.00)
GFR calc Af Amer: >60 mL/min (ref >60)
GFR calc non Af Amer: >60 mL/min (ref >60)
Glucose, Bld: 105 mg/dL — ABNORMAL HIGH (ref 65–99)
Potassium: 3.6 mmol/L (ref 3.5–5.1)
Sodium: 136 mmol/L (ref 135–145)
Total Bilirubin: 0.3 mg/dL (ref 0.3–1.2)
Total Protein: 6.6 g/dL (ref 6.5–8.1)

## 2016-10-07 LAB — CBC WITH DIFFERENTIAL/PLATELET
Basophils Absolute: 0 10*3/uL (ref 0.0–0.1)
Basophils Relative: 0 %
Eosinophils Absolute: 0.1 10*3/uL (ref 0.0–0.7)
Eosinophils Relative: 2 %
HCT: 33.6 % — ABNORMAL LOW (ref 36.0–46.0)
Hemoglobin: 11 g/dL — ABNORMAL LOW (ref 12.0–15.0)
Lymphocytes Relative: 16 %
Lymphs Abs: 1.2 10*3/uL (ref 0.7–4.0)
MCH: 33.5 pg (ref 26.0–34.0)
MCHC: 32.7 g/dL (ref 30.0–36.0)
MCV: 102.4 fL — ABNORMAL HIGH (ref 78.0–100.0)
Monocytes Absolute: 0.5 10*3/uL (ref 0.1–1.0)
Monocytes Relative: 7 %
Neutro Abs: 5.4 10*3/uL (ref 1.7–7.7)
Neutrophils Relative %: 75 %
Platelets: 224 10*3/uL (ref 150–400)
RBC: 3.28 MIL/uL — ABNORMAL LOW (ref 3.87–5.11)
RDW: 15.9 % — ABNORMAL HIGH (ref 11.5–15.5)
WBC: 7.3 10*3/uL (ref 4.0–10.5)

## 2016-10-07 LAB — FERRITIN: Ferritin: 156 ng/mL (ref 11–307)

## 2016-10-07 LAB — IRON AND TIBC
Iron: 79 ug/dL (ref 28–170)
Saturation Ratios: 28 % (ref 10.4–31.8)
TIBC: 277 ug/dL (ref 250–450)
UIBC: 198 ug/dL

## 2016-10-07 MED ORDER — SODIUM CHLORIDE 0.9% FLUSH
10.0000 mL | INTRAVENOUS | Status: DC | PRN
  Administered 2016-10-07: 10 mL
  Filled 2016-10-07: qty 10

## 2016-10-07 MED ORDER — SODIUM CHLORIDE 0.9 % IV SOLN
Freq: Once | INTRAVENOUS | Status: AC
  Administered 2016-10-07: 12:00:00 via INTRAVENOUS
  Filled 2016-10-07: qty 5

## 2016-10-07 MED ORDER — CISPLATIN CHEMO INJECTION 100MG/100ML
40.0000 mg/m2 | Freq: Once | INTRAVENOUS | Status: AC
Start: 2016-10-07 — End: 2016-10-07
  Administered 2016-10-07: 65 mg via INTRAVENOUS
  Filled 2016-10-07: qty 65

## 2016-10-07 MED ORDER — DENOSUMAB 120 MG/1.7ML ~~LOC~~ SOLN
120.0000 mg | Freq: Once | SUBCUTANEOUS | Status: AC
  Administered 2016-10-07: 120 mg via SUBCUTANEOUS
  Filled 2016-10-07: qty 1.7

## 2016-10-07 MED ORDER — SODIUM CHLORIDE 0.9 % IV SOLN
Freq: Once | INTRAVENOUS | Status: AC
  Administered 2016-10-07: 12:00:00 via INTRAVENOUS

## 2016-10-07 MED ORDER — HEPARIN SOD (PORK) LOCK FLUSH 100 UNIT/ML IV SOLN
INTRAVENOUS | Status: AC
  Filled 2016-10-07: qty 5

## 2016-10-07 MED ORDER — PALONOSETRON HCL INJECTION 0.25 MG/5ML
0.2500 mg | Freq: Once | INTRAVENOUS | Status: AC
  Administered 2016-10-07: 0.25 mg via INTRAVENOUS
  Filled 2016-10-07: qty 5

## 2016-10-07 MED ORDER — HEPARIN SOD (PORK) LOCK FLUSH 100 UNIT/ML IV SOLN
500.0000 [IU] | Freq: Once | INTRAVENOUS | Status: AC | PRN
  Administered 2016-10-07: 500 [IU]

## 2016-10-07 MED ORDER — DEXTROSE-NACL 5-0.45 % IV SOLN
Freq: Once | INTRAVENOUS | Status: AC
  Administered 2016-10-07: 10:00:00 via INTRAVENOUS
  Filled 2016-10-07: qty 10

## 2016-10-07 NOTE — Patient Instructions (Signed)
[facility]   CHEMOTHERAPY INSTRUCTIONS  You will have treatment every 2 weeks.  You will have 2 hours of IV fluids given prior to receiving Cisplatin and 2 hours of fluids given after Cisplatin. This will help protect your kidneys.  You will see the doctor regularly throughout treatment.  We monitor your lab work prior to every treatment.  The doctor monitors your response to treatment by the way you are feeling, your blood work, and scans periodically.   You will receive the following premedications prior to receiving chemotherapy: Premeds: Aloxi - high powered nausea/vomiting prevention medication used for chemotherapy patients. Emend - high powered nausea/vomiting prevention medication used for chemotherapy patients. Dexamethasone - steroid - given to reduce the risk of you having an allergic type reaction to the chemotherapy. Dex can cause you to feel energized, nervous/anxious/jittery, make you have trouble sleeping, and/or make you feel hot/flushed in the face/neck and/or look pink/red in the face/neck. These side effects will pass as the Dex wears off. (takes 20 minutes to infuse)    POTENTIAL SIDE EFFECTS OF TREATMENT:  Cisplatin (Generic Name) Other Names: Platinol, platinum  About This Drug Cisplatin is a drug used to treat cancer. This drug is given in the vein (IV).  This drug takes 1 hour to infuse.  Possible Side Effects (More Common) . This drug may affect how your kidneys work. Your kidney function will be checked as needed. . Electrolyte changes. Your blood will be checked for electrolyte changes as needed. . High-frequency hearing loss may occur. You will get IV fluids before and during the Cisplatin infusion to help prevent this. You may also get ringing in the ears. . Bone marrow depression. This is a decrease in the number of white blood cells, red blood cells, and platelets. This may raise your risk of infection, make you tired and  weak (fatigue), and raise your risk of bleeding. . Nausea and throwing up (vomiting). These symptoms may happen within a few hours after your treatment and may last for a few days to a week. Medicines are available to stop or lessen these side effects.  Possible Side Effects (Less Common) . Effects on the nerves are called peripheral neuropathy. You may feel numbness or pain in your hands and feet. It may be hard for you to button your clothes, open jars, or walk as usual. The effect on the nerves may get worse with more doses of the drug. These effects get better in some people after the drug is stopped, but it does not get better in all people. Marland Kitchen Blurred vision or other changes in eyesight. . Soreness of the mouth and throat. You may have red areas, white patches, or sores that hurt. . Hair loss. You may notice your hair getting thin. Some patients lose their hair. Your hair often grows back when treatment is done.  Allergic Reactions Allergic reactions to this drug are rare, but may happen in some patients. Signs of allergic reactions to this drug may be a rash, fever, chills, feeling dizzy, trouble breathing, and/or feeling that your heart is beating in a fast or not normal way.  Treating Side Effects . Drink 6-8 cups of fluids each day unless your doctor has told you to limit your fluid intake due to some other health problem. A cup is 8 ounces of fluid. If you throw up or have loose bowel movements you should drink more fluids so that you do not become dehydrated (lack  water in the body due to losing too much fluid). . If you have numbness and tingling in your hands and feet, be careful when cooking, walking, and handling sharp objects and hot liquids. . Mouth care is very important. Your mouth care should consist of routine, gentle cleaning of your teeth or dentures and rinsing your mouth with a mixture of 1/2 teaspoon of salt in 8 ounces of water or  teaspoon of baking soda in 8 ounces of  water. This should be done at least after each meal and at bedtime. . If you have mouth sores, avoid mouthwash that has alcohol. Also avoid alcohol and smoking because they can bother your mouth and throat. . Talk with your nurse about getting a wig before you lose your hair. Also, call the [facility] [facility] at [phone] to find out information about the "Look Good, Feel Better" program close to where you live. It is a free program where women getting chemotherapy can learn about wigs, turbans and scarves as well as makeup techniques and skin and nail care.  Food and Drug Interactions There are no known interactions of Cisplatin with food. This drug may interact with other medicines. Tell your doctor and pharmacist about all the medicines and dietary supplements (vitamins, minerals, herbs and others) that you are taking at this time. The safety and use of dietary supplements and alternative diets are often not known. Using these might affect your cancer or interfere with your treatment. Until more is known, you should not use dietary supplements or alternative diets without your cancer doctor's help.  When to Call the Doctor Call your doctor or nurse right away if you have any of these symptoms: . Rash or itching . Feeling dizzy or lightheaded . Wheezing or trouble breathing . Swelling of the face . Fever of 100.5 F (38 C) or above . Chills . Easy bleeding or bruising . Decreased urine . Weight gain of 5 pounds in one week (fluid retention) . Nausea that stops you from eating or drinking . Throwing up more than 3 times a day Call your doctor or nurse as soon as possible if you have these symptoms: . Numbness, tingling, decreased feeling or weakness in fingers, toes, arms, or legs . Trouble walking or changes in the way you walk, feeling clumsy when buttoning clothes, opening jars, or other routine hand motions . Blurred vision or other changes in eyesight . Changes in hearing,  ringing in the ears . Pain in your mouth or throat that makes it hard to eat or drink . Fatigue that interferes with your daily activities  Sexual Problems and Reproductive Concerns . Infertility warning: Sexual problems and reproduction concerns may occur. In both men and women, this drug may affect your ability to have children. This cannot be determined before your treatment. Speak with your doctor or nurse if you plan to have children. Ask for information on sperm or egg banking. . In men, this drug may interfere with your ability to make sperm, but it should not change your ability to have sexual relations. . In women, menstrual bleeding may become irregular or stop while you are receiving this drug. Do not assume that you cannot become pregnant if you do not have a menstrual period. . Women may experience signs of menopause like vaginal dryness, itching, and pain during sexual relations . Genetic counseling is available for you to talk about the effects of this drug therapy on future pregnancies. Also, a Dietitian can  look at the possible risk of problems in the unborn baby due to this medicine if an exposure happens during pregnancy. . Pregnancy warning: The drug may have harmful effects on the unborn child, so effective methods of birth control should be used during your cancer treatment. . Breast feeding warning: Women should not breast feed during treatment because this drug could enter the breast milk and badly harm a breast feeding baby.   EDUCATIONAL MATERIALS GIVEN AND REVIEWED:  Chemotherapy and you book, nutrition book, information on cisplatin  SELF CARE ACTIVITIES WHILE ON CHEMOTHERAPY: Hydration Increase your fluid intake 48 hours prior to treatment and drink at least 8 to 12 cups (64 ounces) of water/decaff beverages per day after treatment. You can still have your cup of coffee or soda but these beverages do not count as part of your 8 to 12 cups that you  need to drink daily. No alcohol intake.  Medications Continue taking your normal prescription medication as prescribed.  If you start any new herbal or new supplements please let us know first to make sure it is safe.  Mouth Care Have teeth cleaned professionally before starting treatment. Keep dentures and partial plates clean. Use soft toothbrush and do not use mouthwashes that contain alcohol. Biotene is a good mouthwash that is available at most pharmacies or may be ordered by calling [phone]. Use warm salt water gargles (1 teaspoon salt per 1 quart warm water) before and after meals and at bedtime. Or you may rinse with 2 tablespoons of three-percent hydrogen peroxide mixed in eight ounces of water. If you are still having problems with your mouth or sores in your mouth please call the clinic. If you need dental work, please let the doctor know before you go for your appointment so that we can coordinate the best possible time for you in regards to your chemo regimen. You need to also let your dentist know that you are actively taking chemo. We may need to do labs prior to your dental appointment.   Skin Care Always use sunscreen that has not expired and with SPF (Sun Protection Factor) of 50 or higher. Wear hats to protect your head from the sun. Remember to use sunscreen on your hands, ears, face, & feet.  Use good moisturizing lotions such as udder cream, eucerin, or even Vaseline. Some chemotherapies can cause dry skin, color changes in your skin and nails.    . Avoid long, hot showers or baths. . Use gentle, fragrance-free soaps and laundry detergent. . Use moisturizers, preferably creams or ointments rather than lotions because the thicker consistency is better at preventing skin dehydration. Apply the cream or ointment within 15 minutes of showering. Reapply moisturizer at night, and moisturize your hands every time after you wash them.  Hair Loss (if your doctor says your hair  will fall out)  . If your doctor says that your hair is likely to fall out, decide before you begin chemo whether you want to wear a wig. You may want to shop before treatment to match your hair color. . Hats, turbans, and scarves can also camouflage hair loss, although some people prefer to leave their heads uncovered. If you go bare-headed outdoors, be sure to use sunscreen on your scalp. . Cut your hair short. It eases the inconvenience of shedding lots of hair, but it also can reduce the emotional impact of watching your hair fall out. . Don't perm or color your hair during chemotherapy. Those chemical treatments  are already damaging to hair and can enhance hair loss. Once your chemo treatments are done and your hair has grown back, it's OK to resume dyeing or perming hair. With chemotherapy, hair loss is almost always temporary. But when it grows back, it may be a different color or texture. In older adults who still had hair color before chemotherapy, the new growth may be completely gray.  Often, new hair is very fine and soft.  Infection Prevention Please wash your hands for at least 30 seconds using warm soapy water. Handwashing is the #1 way to prevent the spread of germs. Stay away from sick people or people who are getting over a cold. If you develop respiratory systems such as green/yellow mucus production or productive cough or persistent cough let us know and we will see if you need an antibiotic. It is a good idea to keep a pair of gloves on when going into grocery stores/[employer] to decrease your risk of coming into contact with germs on the carts, etc. Carry alcohol hand gel with you at all times and use it frequently if out in public. If your temperature reaches 100.5 or higher please call the clinic and let us know.  If it is after hours or on the weekend please go to the ER if your temperature is over 100.5.  Please have your own personal thermometer at home to use.    Sex and bodily  fluids If you are going to have sex, a condom must be used to protect the person that isn't taking chemotherapy. Chemo can decrease your libido (sex drive). For a few days after chemotherapy, chemotherapy can be excreted through your bodily fluids.  When using the toilet please close the lid and flush the toilet twice.  Do this for a few day after you have had chemotherapy.    Effects of chemotherapy on your sex life Some changes are simple and won't last long. They won't affect your sex life permanently. Sometimes you may feel: . too tired . not strong enough to be very active . sick or sore  . not in the mood . anxious or low Your anxiety might not seem related to sex. For example, you may be worried about the cancer and how your treatment is going. Or you may be worried about money, or about how you family are coping with your illness. These things can cause stress, which can affect your interest in sex. It's important to talk to your partner about how you feel. Remember - the changes to your sex life don't usually last long. There's usually no medical reason to stop having sex during chemo. The drugs won't have any long term physical effects on your performance or enjoyment of sex. Cancer can't be passed on to your partner during sex  Contraception It's important to use reliable contraception during treatment. Avoid getting pregnant while you or your partner are having chemotherapy. This is because the drugs may harm the baby. Sometimes chemotherapy drugs can leave a man or woman infertile.  This means you would not be able to have children in the future. You might want to talk to someone about permanent infertility. It can be very difficult to learn that you may no longer be able to have children. Some people find counselling helpful. There might be ways to preserve your fertility, although this is easier for men than for women. You may want to speak to a fertility expert. You can talk about  sperm banking  or harvesting your eggs. You can also ask about other fertility options, such as donor eggs. If you have or have had breast cancer, your doctor might advise you not to take the contraceptive pill. This is because the hormones in it might affect the cancer.  It is not known for sure whether or not chemotherapy drugs can be passed on through semen or secretions from the vagina. Because of this some doctors advise people to use a barrier method if you have sex during treatment. This applies to vaginal, anal or oral sex. Generally, doctors advise a barrier method only for the time you are actually having the treatment and for about a week after your treatment. Advice like this can be worrying, but this does not mean that you have to avoid being intimate with your partner. You can still have close contact with your partner and continue to enjoy sex.  Animals If you have cats or birds we just ask that you not change the litter or change the cage.  Please have someone else do this for you while you are on chemotherapy.   Food Safety During and After Cancer Treatment Food safety is important for people both during and after cancer treatment. Cancer and cancer treatments, such as chemotherapy, radiation therapy, and stem cell/bone marrow transplantation, often weaken the immune system. This makes it harder for your body to protect itself from foodborne illness, also called food poisoning. Foodborne illness is caused by eating food that contains harmful bacteria, parasites, or viruses.  Foods to avoid Some foods have a higher risk of becoming tainted with bacteria. These include: Marland Kitchen Unwashed fresh fruit and vegetables, especially leafy vegetables that can hide dirt and other contaminants . Raw sprouts, such as alfalfa sprouts . Raw or undercooked beef, especially ground beef, or other raw or undercooked meat and poultry . Fatty, fried, or spicy foods immediately before or after treatment.  These  can sit heavy on your stomach and make you feel nauseous. . Raw or undercooked shellfish, such as oysters. . Sushi and sashimi, which often contain raw fish.  . Unpasteurized beverages, such as unpasteurized fruit juices, raw milk, raw yogurt, or cider . Undercooked eggs, such as soft boiled, over easy, and poached; raw, unpasteurized eggs; or foods made with raw egg, such as homemade raw cookie dough and homemade mayonnaise Simple steps for food safety Shop smart. . Do not buy food stored or displayed in an unclean area. . Do not buy bruised or damaged fruits or vegetables. . Do not buy cans that have cracks, dents, or bulges. . Pick up foods that can spoil at the end of your shopping trip and store them in a cooler on the way home. Prepare and clean up foods carefully. . Rinse all fresh fruits and vegetables under running water, and dry them with a clean towel or paper towel. . Clean the top of cans before opening them. . After preparing food, wash your hands for 20 seconds with hot water and soap. Pay special attention to areas between fingers and under nails. . Clean your utensils and dishes with hot water and soap. Marland Kitchen Disinfect your kitchen and cutting boards using 1 teaspoon of liquid, unscented bleach mixed into 1 quart of water.   Dispose of old food. . Eat canned and packaged food before its expiration date (the "use by" or "best before" date). . Consume refrigerated leftovers within 3 to 4 days. After that time, throw out the food. Even if the food  does not smell or look spoiled, it still may be unsafe. Some bacteria, such as Listeria, can grow even on foods stored in the refrigerator if they are kept for too long. Take precautions when eating out. . At restaurants, avoid buffets and salad bars where food sits out for a long time and comes in contact with many people. Food can become contaminated when someone with a virus, often a norovirus, or another "bug" handles it. . Put any  leftover food in a "to-go" container yourself, rather than having the server do it. And, refrigerate leftovers as soon as you get home. . Choose restaurants that are clean and that are willing to prepare your food as you order it cooked.    MEDICATIONS: Zofran/Ondansetron 8mg  tablet. Take 1 tablet every 8 hours as needed for nausea/vomiting. (#1 nausea med to take, this can constipate)  Compazine/Prochlorperazine 10mg  tablet. Take 1 tablet every 6 hours as needed for nausea/vomiting. (#2 nausea med to take, this can make you sleepy)   EMLA cream. Apply a quarter size amount to port site 1 hour prior to chemo. Do not rub in. Cover with plastic wrap.   Over-the-Counter Meds:  Miralax 1 capful in 8 oz of fluid daily. May increase to two times a day if needed. This is a stool softener. If this doesn't work proceed you can add:  Senokot S-start with 1 tablet two times a day and increase to 4 tablets two times a day if needed. (total of 8 tablets in a 24 hour period). This is a stimulant laxative.   Call us if this does not help your bowels move.   Imodium 2mg  capsule. Take 2 capsules after the 1st loose stool and then 1 capsule every 2 hours until you go a total of 12 hours without having a loose stool. Call the [facility] if loose stools continue. If diarrhea occurs @ bedtime, take 2 capsules @ bedtime. Then take 2 capsules every 4 hours until morning. Call [facility].    Diarrhea Sheet  If you are having loose stools/diarrhea, please purchase Imodium and begin taking as outlined:  At the first sign of poorly formed or loose stools you should begin taking Imodium(loperamide) 2 mg capsules.  Take two caplets (4mg ) followed by one caplet (2mg ) every 2 hours until you have had no diarrhea for 12 hours.  During the night take two caplets (4mg ) at bedtime and continue every 4 hours during the night until the morning.  Stop taking Imodium only after there is no sign of diarrhea for 12 hours.     Always call the [facility] if you are having loose stools/diarrhea that you can't get under control.  Loose stools/disrrhea leads to dehydration (loss of water) in your body.  We have other options of trying to get the loose stools/diarrhea to stopped but you must let us know!    Constipation Sheet *Miralax in 8 oz of fluid daily.  May increase to two times a day if needed.  This is a stool softener.  If this not enough to keep your bowel regular:  You can add:  *Senokot S, start with one tablet twice a day and can increase to 4 tablets twice a day if needed.  This is a stimulant laxative.   Sometimes when you take pain medication you need BOTH a medicine to keep your stool soft and a medicine to help your bowel push it out!  Please call if the above does not work for  you.   Do not go more than 2 days without a bowel movement.  It is very important that you do not become constipated.  It will make you feel sick to your stomach (nausea) and can cause abdominal pain and vomiting.     Nausea Sheet  Zofran/Ondansetron 8mg  tablet. Take 1 tablet every 8 hours as needed for nausea/vomiting. (#1 nausea med to take, this can constipate)  Compazine/Prochlorperazine 10mg  tablet. Take 1 tablet every 6 hours as needed for nausea/vomiting. (#2 nausea med to take, this can make you sleepy)  You can take these medications together or separately.  We would first like for you to try the Ondansetron by itself and then take the Prochloperizine if needed. But you are allowed to take both medications at the same time if your nausea is that severe.  If you are having persistent nausea (nausea that does not stop) please take these medications on a staggered schedule so that the nausea medication stays in your body.  Please call the [facility] and let us know the amount of nausea that you are experiencing.  If you begin to vomit, you need to call the [facility] and if it is the weekend and you have  vomited more than one time and cant get it to stop-go to the Emergency Room.  Persistent nausea/vomiting can lead to dehydration (loss of fluid in your body) and will make you feel terrible.   Ice chips, sips of clear liquids, foods that are @ room temperature, crackers, and toast tend to be better tolerated.    SYMPTOMS TO REPORT AS SOON AS POSSIBLE AFTER TREATMENT:  FEVER GREATER THAN 100.5 F  CHILLS WITH OR WITHOUT FEVER  NAUSEA AND VOMITING THAT IS NOT CONTROLLED WITH YOUR NAUSEA MEDICATION  UNUSUAL SHORTNESS OF BREATH  UNUSUAL BRUISING OR BLEEDING  TENDERNESS IN MOUTH AND THROAT WITH OR WITHOUT PRESENCE OF ULCERS  URINARY PROBLEMS  BOWEL PROBLEMS  UNUSUAL RASH    Wear comfortable clothing and clothing appropriate for easy access to any Portacath or PICC line. Let us know if there is anything that we can do to make your therapy better!    What to do if you need assistance after hours or on the weekends: CALL [phone].  HOLD on the line, do not hang up.  You will hear multiple messages but at the end you will be connected with a nurse triage line.  They will contact the doctor if necessary.  Most of the time they will be able to assist you.  Do not call the hospital operator.      I have been informed and understand all of the instructions given to me and have received a copy. I have been instructed to call the clinic [phone] or my family physician as soon as possible for continued medical care, if indicated. I do not have any more questions at this time but understand that I may call the [facility] or the Patient Navigator at [phone] during office hours should I have questions or need assistance in obtaining follow-up care.

## 2016-10-07 NOTE — Progress Notes (Signed)
[name] tolerated chemo tx with hydration and Xgeva injection well without complaints or incident. Labs reviewed and pt denied any tooth,jaw or leg pain prior to administering chemotherapy and Xgeva. VSS upon discharge. Pt discharged self ambulatory in satisfactory condition accompanied by her friend

## 2016-10-07 NOTE — Progress Notes (Signed)
Pulled chemotherapy teaching together

## 2016-10-07 NOTE — Patient Instructions (Signed)
[facility] Discharge Instructions for Patients Receiving Chemotherapy   Beginning January 23rd 2017 lab work for the [facility] will be done in the  Main lab at [facility] on 1st floor. If you have a lab appointment with the [facility] please come in thru the  Main Entrance and check in at the main information desk   Today you received the following chemotherapy agents Cisplatin as well as Xgeva injection. Follow-up as scheduled. Call clinic for any questions or concerns  To help prevent nausea and vomiting after your treatment, we encourage you to take your nausea medication   If you develop nausea and vomiting, or diarrhea that is not controlled by your medication, call the clinic.  The clinic phone number is [phone]. Office hours are Monday-Friday 8:30am-5:00pm.  BELOW ARE SYMPTOMS THAT SHOULD BE REPORTED IMMEDIATELY:  *FEVER GREATER THAN 101.0 F  *CHILLS WITH OR WITHOUT FEVER  NAUSEA AND VOMITING THAT IS NOT CONTROLLED WITH YOUR NAUSEA MEDICATION  *UNUSUAL SHORTNESS OF BREATH  *UNUSUAL BRUISING OR BLEEDING  TENDERNESS IN MOUTH AND THROAT WITH OR WITHOUT PRESENCE OF ULCERS  *URINARY PROBLEMS  *BOWEL PROBLEMS  UNUSUAL RASH Items with * indicate a potential emergency and should be followed up as soon as possible. If you have an emergency after office hours please contact your primary care physician or go to the nearest emergency department.  Please call the clinic during office hours if you have any questions or concerns.   You may also contact the Patient Navigator at [phone] should you have any questions or need assistance in obtaining follow up care.      Resources For Cancer Patients and their Caregivers ? American Cancer Society: Can assist with transportation, wigs, general needs, runs Look Good Feel Better.        984-241-2961 ? Cancer Care: Provides financial assistance, online support groups, medication/co-pay  assistance.  1-800-813-HOPE 8656533753) ? [facility] Assists [place] cancer patients and their families through emotional , educational and financial support.  830-624-8537 ? [place] DSS Where to apply for food stamps, Medicaid and utility assistance. (581) 719-3980 ? [facility]: Transportation to medical appointments. 320-446-3479 ? Social Security Administration: May apply for disability if have a Stage IV cancer. 3371434517 407-212-5324 ? [employer], Disability and Transit Services: Assists with nutrition, care and transit needs. [phone]

## 2016-10-07 NOTE — Progress Notes (Signed)
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      04/09/2008 Procedure    Biopsy of left axillary node mass       04/16/2008 - 08/13/2008 Chemotherapy    Dose dense epirubicin/Cytoxan 4 cycles followed by docetaxel 4 cycles. Herceptin initiated with docetaxel for a total of 52 weeks.      08/30/2008 Definitive Surgery    Left mastectomy revealing persistence of disease in the breast and lymph nodes.      09/17/2008 - 11/26/2008 Radiation Therapy    Radiation to left chest wall, supraclavicular, and axillary nodal areas.      11/27/2008 - 04/20/2011 Anti-estrogen oral therapy    Letrozole      03/30/2011 Procedure    Left supraclavicular lymph node biopsy      03/30/2011 Pathology Results    Recurrent breast cancer, invasive ductal type, ER 100%, PR 40%, HER-2 NEGATIVE      04/21/2011 - 06/04/2014 Anti-estrogen oral therapy    Tamoxifen      05/25/2014 PET scan    Progression in left submandibular lymph node, mediastinal lymph nodes, and probable lung involvement.      05/25/2014 Progression    PET scan demonstrates progression of disease.      06/13/2014 Procedure    Left supraclavicular mass biopsy      06/13/2014 Pathology Results    Metastatic carcinoma consistent with breast primary, involving skeletal muscle, perineural invasion, LVI, no lymphoid tissue identified, ER 98%, PR negative, HER-2 negative      06/25/2014 - 10/09/2014 Chemotherapy   Carboplatin/Taxol 6 cycles with excellent response      11/06/2014 - 06/10/2015 Chemotherapy    Palbociclib and Faslodex      01/08/2015 Adverse Reaction    Grade 3 neutropenia with cycle #2 of Palbociclib.      01/08/2015 Treatment Plan Change    Palbociclib dose reduced to 100 mg for cycle #3.      06/10/2015 Progression    Right cervical lymph node progression clinically developed over 4 days.  This lymph node was positive on PET scan in September 2016 and at that time was approximately 8 mm with possible progression in a single bone metastasis.      06/17/2015 Procedure    Biopsy of right cervical lymph node      06/17/2015 Pathology Results    Genetic sequencing performed revealing BRCA2 positivity      07/02/2015 - 08/01/2015 Chemotherapy    Exemestane/everolimus, 25 mg/ 5 mg x 7 days then increasing to 10 mg daily.      08/01/2015 PET scan    Diffuse pneumonitis.        08/01/2015 Adverse Reaction    Pneumonitis secondary to drug reaction from everolimus.      08/23/2015 - 12/23/2015 Chemotherapy    Cisplatin 40 mg/m every other week      11/15/2015 PET scan    Interval resolution of hypermetabolic  airspace opacities in the lungs which were likely inflammatory. Abnormal high activity at the anal rectal junction. Very low grade residual activity at the site of L3 metastatic lesion.      12/23/2015 Treatment Plan Change    Holiday from treatment in preparation for trip to [place].      04/20/2016 - 07/27/2016 Chemotherapy    XELODA 1300 mg po bid 7 days on/ 7 days off       05/15/2016 PET scan    1. Partial metabolic response at the hypermetabolic anorectal junction malignancy. 2. No hypermetabolic metastatic disease. Lumbar vertebral sclerotic osseous lesions are non hypermetabolic. 3. Hypermetabolism associated with acute/early subacute fractures of the right clavicle and right anterior second through fifth ribs, without associated bone lesions. 4. Aortic  atherosclerosis.  Three-vessel coronary atherosclerosis.      07/24/2016 PET scan    1. Intense metabolic activity associated mucosal thickening in the anal rectal junction is consistent with residual carcinoma. Mild increase in metabolic activity. 2. No hypermetabolic lymph nodes in the pelvis. 3. No evidence distant metastatic disease. 4. No evidence breast cancer recurrence.      07/27/2016 Procedure    1. Lymph node, biopsy, Left Posterior Neck - METASTATIC CARCINOMA. - SEE MICROSCOPIC DESCRIPTION 2. Skin , and Subcutaneous Left Posterior Neck - BENIGN SKIN AND SUBCUTANEOUS ADIPOSE TISSUE. - NO EVIDENCE OF MALIGNANCY.      07/27/2016 Pathology Results    HER2 NEGATIVE, ER/PR POSITIVE      08/26/2016 -  Chemotherapy    Olaparib 300 mg BID beginning on 08/26/2016      09/18/2016 Treatment Plan Change    Olaparib on HOLD due to recent surgery and feeling poor.      09/24/2016 Imaging    MRI brain- No acute intracranial abnormality or intracranial metastatic disease.       Squamous cell carcinoma of anus ([place])   12/31/2015 Procedure    Colonoscopy by Dr. Britta Mccreedy      12/31/2015 Pathology Results    Invasive squamous cell carcinoma approximate 4 cm mass posterior wall of rectum      01/13/2016 Treatment Plan Change    Dr. Leighton Ruff consultation- no surgery.  Recommend chemoXRT      01/16/2016 Pathology Results    HPV POSITIVE      02/07/2016 - 03/10/2016 Chemotherapy    Xeloda 1450 mg PO BID _0 /15/2017 PET scan    1. Intense metabolic activity associated mucosal thickening in the anal rectal junction is consistent with residual carcinoma. Mild increase in metabolic activity. 2. No hypermetabolic lymph nodes in the pelvis. 3. No evidence distant metastatic disease. 4. No evidence breast cancer recurrence.      09/01/2016 Surgery    ABDOMINAL PERINEAL RESECTION by Dr. Leighton Ruff      8/91/6945 Pathology Results    Diagnosis 1. Anus, biopsy - INVASIVE SQUAMOUS CELL CARCINOMA. 2. Colon,  resection margin (donut), additional posterior - BENIGN FIBROFATTY AND VASCULAR SOFT TISSUE. - BENIGN MUSCLE PRESENT. - NO TUMOR SEEN. 3. Colon, segmental resection for tumor, rectum and anus - INVASIVE WELL TO MODERATELY DIFFERENTIATED SQUAMOUS CELL CARCINOMA, SPANNING 4.7 CM IN GREATEST DIMENSION. - TUMOR INVADES THROUGH BOWEL AND INTO UNDERLYING FIBROFATTY AND VASCULAR SOFT TISSUE AND INVOLVES POSTERIOR RADIAL NON-PERITONEALIZED MARGIN. - PROXIMAL AND DISTAL MARGINS ARE NEGATIVE. - THIRTEEN BENIGN LYMPH NODES WITH NO TUMOR SEEN (0/13). - SEE ONCOLOGY TEMPLATE.       HISTORY OF PRESENT ILLNESS:   [name] is a 66 y.o. female with a medical history significant for Stage IV ER/PR+ breast cancer with initial diagnosis showing HER2 POSITIVITY initially diagnosed with Stage IIIC disease in 2012 and treated curative intent with metastatic disease discovered in October 2015 and osseous involvement noted on PET imaging on 09/10/2014, who is here for a follow up of Stage IV breast cancer and  squamous cell carcinoma of anus. She completed treatment of her anal carcinoma with XELODA/XRT and continues on XELODA therapy. At her last visit she noted a small palpable LN at the posterior aspect of her neck. This has been biopsied by Dr. Donne Hazel and positive for metastatic breast cancer, prognostic markers are noted above.   She is doing well today. 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  Occasionally she will drop something. Pt states that she is still very depressed. She is not seeing a therapist, but has recently started anti-depressants. She has not been eating much. She says she is eating enough to heal, and that's it. Denies any other concerns. She did not respond well on lynparza, but had a good reaction to Cisplatin.   Review of Systems  Constitutional:       Loss of appetite  HENT: Negative.   Eyes: Negative.   Respiratory: Negative.   Cardiovascular: Negative.   Gastrointestinal: Negative.   Genitourinary: Negative.   Musculoskeletal: Negative.   Skin: Negative.   Neurological: Positive for tingling (hands).  Endo/Heme/Allergies: Negative.   Psychiatric/Behavioral: Positive for depression.  All other systems reviewed and are negative. 14 point review of systems was performed and is negative except as detailed under history of present illness and above  PAST MEDICAL HISTORY:   Past Medical History:  Diagnosis Date  . Adenocarcinoma of breast ([place]) 01/27/2011   stage IIIc breast cancer  . Bladder infection   . Bone metastasis ([place]) 01/17/2016  . Borderline hypertension   . Breast cancer ([place])   . Closed fracture of humerus sept 2013  . Depression   . Family history of adverse reaction to anesthesia    father remembered his surgery  . Fatigue   . GERD (gastroesophageal reflux disease)   . Hypertension   . 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 Osteoporosis 01/27/2011  . Pneumonia   . Port catheter in place 04/14/2013  . Post-mastectomy lymphedema syndrome   . 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 Reflux   . Retinal detachment   . 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 Swollen lymph nodes   . URI (upper respiratory infection) 02/17/12  . Wears glasses     ALLERGIES: Allergies  Allergen Reactions  . Ciprofloxacin Other (See Comments)    tendonitis        MEDICATIONS: I have reviewed the patient's current medications.    Current Outpatient Prescriptions on File  Prior to Visit  Medication Sig Dispense Refill  . Acetylcarnitine HCl (ACETYL L-CARNITINE PO) Take 1,000 mg by mouth daily.    Marland Kitchen ALPRAZolam (XANAX) 0.5 MG tablet Take 1 tablet 1 hours prior to MRI and may repeat in the waiting room. 5 tablet 0  . buPROPion (WELLBUTRIN XL) 150 MG 24 hr tablet Take 1 tablet (150 mg total) by mouth daily. 30 tablet 1  . CALCIUM-VITAMIN D PO Take 1 tablet by mouth 2 (two) times daily. 1200 mg /1600 international units daily    . fentaNYL (DURAGESIC - DOSED MCG/HR) 25 MCG/HR patch Combine with 50 mcg patch for total of 75 mcg, change every 3 days (Patient not taking: Reported on 10/01/2016) 10 patch 0  . magnesium oxide (MAG-OX) 400 MG tablet Take 400 mg by mouth 2 (two) times daily.    . metoprolol succinate (TOPROL-XL) 25 MG 24 hr tablet TAKE 1 TABLET DAILY  5  . mirtazapine (REMERON) 30 MG tablet Take 1 tablet (30 mg total) by mouth at bedtime. Take 15 mg for 3 days then increase to 30 mg (Patient taking differently: Take 30 mg by mouth at bedtime. ) 60 tablet 2  . nitrofurantoin (MACRODANTIN) 100 MG capsule Take 1 capsule (100 mg total) by mouth daily. (Patient taking differently: Take 100 mg by mouth at bedtime. ) 90 capsule 1  . omeprazole (PRILOSEC) 20 MG capsule Take 20 mg by mouth daily as needed (heartburn).    Marland Kitchen omeprazole (PRILOSEC) 40 MG capsule Take 1 capsule (40 mg total) by mouth 2 (two) times daily. (Patient taking differently: Take 40 mg by mouth daily as needed (heartburn). ) 180 capsule 4  . ondansetron (ZOFRAN) 8 MG tablet Take 1 tablet (8 mg total) by mouth every 8 (eight) hours as needed for nausea or vomiting. 30 tablet 1  . vitamin B-12 (CYANOCOBALAMIN) 1000 MCG tablet Take 1,000 mcg by mouth daily.     No current facility-administered medications on file prior to visit.      PAST SURGICAL HISTORY Past Surgical History:  Procedure  Laterality Date  . ABDOMINAL PERINEAL BOWEL RESECTION N/A 09/01/2016   Procedure: ABDOMINAL PERINEAL RESECTION PERMANENT COLOSTOMY;  Surgeon: Leighton Ruff, MD;  Location: [facility];  Service: General;  Laterality: N/A;  . biopsy of lymph node     super clavicle  . BTL    . CATARACT EXTRACTION    . cervical disc fusioni    . LYMPH NODE BIOPSY Right 06/19/2015   Procedure: RIGHT NECK LYMPH NODE EXCISION;  Surgeon: Rolm Bookbinder, MD;  Location: [place];  Service: General;  Laterality: Right;  . MASS EXCISION Left 07/27/2016   Procedure: EXCISION OF POSTERIOR NECK MASS;  Surgeon: Rolm Bookbinder, MD;  Location: [place];  Service: General;  Laterality: Left;  Marland Kitchen MASTECTOMY MODIFIED RADICAL     left breast  . Port- a-cath insertion    . retinal detachment and repair      FAMILY HISTORY: Family History  Problem Relation Age of Onset  . Hypertension Mother   . Heart disease Mother     atrial fib  . Heart disease Father   . Other Father     glaucoma    SOCIAL HISTORY:  reports that she has never smoked. She has never used smokeless tobacco. She reports that she drinks about 1.8 oz of alcohol per week . She reports that she uses drugs, including Marijuana.  Social History   Social History  . Marital status:  Divorced    Spouse name: N/A  . Number of children: N/A  . Years of education: N/A   Social History Main Topics  . Smoking status: Never Smoker  . Smokeless tobacco: Never Used  . Alcohol use 1.8 oz/week    3 Glasses of wine per week     Comment: Drinks 3 drinks very other week  . Drug use: Yes    Types: Marijuana     Comment: During chemo, none over 1 Year  . Sexual activity: No   Other Topics Concern  . None   Social History Narrative  . None    PERFORMANCE STATUS: The patient's performance status is 1 - Symptomatic but completely ambulatory  PHYSICAL EXAM:  Most Recent Vital Signs:   Vitals reviewed  Physical Exam    Constitutional: She is oriented to person, place, and time and well-developed, well-nourished, and in no distress.  HENT:  Head: Normocephalic and atraumatic.  Mouth/Throat: Oropharynx is clear and moist. No oropharyngeal exudate.  Eyes: Conjunctivae and EOM are normal. Pupils are equal, round, and reactive to light. No scleral icterus.  Neck: Normal range of motion. Neck supple. No thyromegaly present.  Cardiovascular: Normal rate, regular rhythm and normal heart sounds.   Pulmonary/Chest: Effort normal and breath sounds normal.  Abdominal: Soft. Bowel sounds are normal. She exhibits no distension. There is no tenderness. There is no rebound.  Musculoskeletal: Normal range of motion. She exhibits no edema.  Lymphadenopathy:    She has no cervical adenopathy.  Neurological: She is alert and oriented to person, place, and time. Gait normal.  Skin: Skin is warm and dry.  Psychiatric: Memory, affect and judgment normal.  Nursing note and vitals reviewed.  LABORATORY DATA:  I have reviewed the data as listed.  CBC Latest Ref Rng & Units 10/07/2016 09/18/2016 09/17/2016  WBC 4.0 - 10.5 K/uL 7.3 5.8 4.5  Hemoglobin 12.0 - 15.0 g/dL 11.0(L) 9.5(L) 9.2(L)  Hematocrit 36.0 - 46.0 % 33.6(L) 28.2(L) 26.8(L)  Platelets 150 - 400 K/uL 224 332 304   CMP Latest Ref Rng & Units 09/18/2016 09/14/2016 09/08/2016  Glucose 65 - 99 mg/dL 112(H) 127(H) 119(H)  BUN 6 - 20 mg/dL _0 Creatinine 0.44 - 1.00 mg/dL 1.04(H) 0.98 1.13(H)  Sodium 135 - 145 mmol/L 138 138 137  Potassium 3.5 - 5.1 mmol/L 3.7 3.8 4.2  Chloride 101 - 111 mmol/L 106 108 102  CO2 22 - 32 mmol/L _1 Calcium 8.9 - 10.3 mg/dL 9.1 8.1(L) 9.2  Total Protein 6.5 - 8.1 g/dL 6.8 6.3(L) 6.6  Total Bilirubin 0.3 - 1.2 mg/dL 0.3 0.5 0.4  Alkaline Phos 38 - 126 U/L 81 58 62  AST 15 - 41 U/L 20 19 49(H)  ALT 14 - 54 U/L 17 20 56(H)    RADIOGRAPHY: I have personally reviewed the radiological images as listed and agreed with the findings  in the report.  Restaging PET 10/05/2016 IMPRESSION: 1. Interval AP resection, without residual significant abnormal activity along the resection site to specifically indicate recurrent disease. No distant active metastatic disease is identified. Postoperative presacral fluid collection along the rectal resection site. 2. Sclerotic lesions at L1 and L3 likely from old metastatic lesions, but not currently hypermetabolic. 3. Old bilateral rib fractures, some of which are healing in the right chest, with a healing right midclavicular fracture. 4. Triangular right upper lobe nodule is stable from 2010 and not hypermetabolic, considered benign. 5. Atherosclerosis. 6. There is some subtle  activity along the inferior aspect of segment 4b of the liver adjacent to the gallbladder, likely benign/incidental, attention on follow up suggested.  MRI Brain w/wo contrast 09/24/2016 IMPRESSION: No acute intracranial abnormality or intracranial metastatic disease.  PATHOLOGY:      ASSESSMENT/PLAN:  Excellent PS Squamous cell carcinoma of anus (HCC) Adenocarcinoma of left breast BRCA2 Positive XELODA related skin toxicity Cancer related pain  Stage IV breast cancer ER/PR positive, HER-2/neu negative. BRCA2 POSITIVE.  History of stage IIIc left breast cancer in 2009 treated with neoadjuvant chemotherapy consisting of epirubicin and Cytoxan followed by 4 cycles of docetaxel from 04/16/2008-08/13/2008 with the initiation of Herceptin for 52 weeks at the start of docetaxel. She then underwent a left mastectomy on 08/30/2008 showing persistent disease in the breast and lymph nodes. This was followed by radiation therapy from 09/17/2008-11/26/2008 to the left chest wall, supraclavicular, and axillary node areas. She then moved on to antiestrogen therapy with letrozole (11/27/2008-04/20/2011). She was found to have recurrent breast cancer on supraclavicular lymph node biopsy on 03/30/2011 leading to a  change in antiestrogen therapy to tamoxifen (04/21/2011-06/04/2014). Pet imaging on 05/25/2014 demonstrated progression of left submandibular lymph node, mediastinal lymph node, and probable lung involvement. This was followed by a biopsy on 06/13/2014 of a left supraclavicular mass that did reveal metastatic carcinoma consistent with breast primary. She was then treated with carboplatin and Taxol for 6 cycles with an excellent response (06/25/2014-10/09/2014). She was then transition back to hormone therapy consisting of Ibrance and Faslodex (11/13/2014-06/10/2015). This was complicated by grade 3 neutropenia with cycle #2 resulting in a dose reduction of Ibrance to 100 mg for cycle 3. Unfortunately, on 06/10/2015, she was noted to have progression of disease in the right mid cervical lymph node over a 4 day period that was PET avid. Therapy was therefore changed to exemestane and everolimus (78/67/5449-20/05/711) that was complicated by an adverse reaction consisting of diffuse pneumonitis. This regimen was therefore discontinued. She then transitioned back to systemic chemotherapy consisting of cisplatin every 14 days (08/23/2015-12/23/2015). She was put on a drug holiday beginning on 12/23/2015 in preparation for a trip to [place] on 01/25/2016.  PET was reviewed in detail. In regards to her breast cancer, PET is essentially negative. Imaging is noted above. Nodal disease in the neck was not detected. We reviewed her pathology, prognostic markers are reviewed. Disease is still HER 2 negative. Pathology is noted above.    Bone metastasis (HCC) Bone metastases from breast cancer. On Xgeva therapy. She is also taking calcium and vitamin D.   Reviewed the PET scan results with the patient. There is no disease activity present on the scan.  She is concerned that she still has disease present because of her biopsy results despite no active disease on scans. She states cisplatin 67m/m2 IV every other week  gave her good disease control of her breast cancer in the past, we will start her back on this today while we await her evaluation for [facility]  Although her disease is not triple negative/BRCA positive, Olaparib is still an option given that she is HER 2 -/BRCA positive. She has not tried ixabepilone, erbulin. Her anal carcinoma may make her ineligible for clinical trial.   [facility].   She will return for follow up in 2 weeks to asses tolerance to maintenance cisplatin.    All questions were answered. The patient knows to call the clinic with  any problems, questions or concerns. We can certainly see the patient much sooner if necessary.  This document serves as a record of services personally performed by Twana First, MD. It was created on her behalf by Martinique Casey, a trained medical scribe. The creation of this record is based on the scribe's personal observations and the provider's statements to them. This document has been checked and approved by the attending provider.  I have reviewed the above documentation for accuracy and completeness, and I agree with the above.  This note is electronically signed by: Martinique M Casey   10/07/2016 9:14 AM

## 2016-10-07 NOTE — Patient Instructions (Signed)
[place] at [facility] Discharge Instructions  RECOMMENDATIONS MADE BY THE CONSULTANT AND ANY TEST RESULTS WILL BE SENT TO YOUR REFERRING PHYSICIAN.  You were seen today by Dr. Twana First Follow up in 2 weeks with next treatment See Amy up front for appointments   Thank you for choosing [place] at [facility] to provide your oncology and hematology care.  To afford each patient quality time with our provider, please arrive at least 15 minutes before your scheduled appointment time.    If you have a lab appointment with the [facility] please come in thru the  Main Entrance and check in at the main information desk  You need to re-schedule your appointment should you arrive 10 or more minutes late.  We strive to give you quality time with our providers, and arriving late affects you and other patients whose appointments are after yours.  Also, if you no show three or more times for appointments you may be dismissed from the clinic at the providers discretion.     Again, thank you for choosing [facility].  Our hope is that these requests will decrease the amount of time that you wait before being seen by our physicians.       _____________________________________________________________  Should you have questions after your visit to [facility], please contact our office at [phone] between the hours of 8:30 a.m. and 4:30 p.m.  Voicemails left after 4:30 p.m. will not be returned until the following business day.  For prescription refill requests, have your pharmacy contact our office.       Resources For Cancer Patients and their Caregivers ? American Cancer Society: Can assist with transportation, wigs, general needs, runs Look Good Feel Better.        435-583-2282 ? Cancer Care: Provides financial assistance, online support groups, medication/co-pay assistance.  1-800-813-HOPE 3472832491) ? [place] Assists [place] cancer patients and their families through emotional , educational and financial support.  706-474-8097 ? [place] DSS Where to apply for food stamps, Medicaid and utility assistance. 640 441 0589 ? [employer]: Transportation to medical appointments. 571-576-6165 ? Social Security Administration: May apply for disability if have a Stage IV cancer. (530)365-3636 670-706-1928 ? [employer], Disability and Transit Services: Assists with nutrition, care and transit needs. [place] Support Programs: @10RELATIVEDAYS @ > Cancer Support Group  2nd Tuesday of the month 1pm-2pm, Journey Room  > Creative Journey  3rd Tuesday of the month 1130am-1pm, Journey Room  > Look Good Feel Better  1st Wednesday of the month 10am-12 noon, Journey Room (Call [place] to register [phone])

## 2016-10-08 ENCOUNTER — Ambulatory Visit (HOSPITAL_COMMUNITY): Payer: BLUE CROSS/BLUE SHIELD

## 2016-10-21 ENCOUNTER — Ambulatory Visit (HOSPITAL_COMMUNITY): Payer: BLUE CROSS/BLUE SHIELD

## 2016-10-21 ENCOUNTER — Ambulatory Visit (HOSPITAL_COMMUNITY): Payer: BLUE CROSS/BLUE SHIELD | Admitting: Oncology

## 2016-11-04 ENCOUNTER — Ambulatory Visit (HOSPITAL_COMMUNITY): Payer: BLUE CROSS/BLUE SHIELD

## 2016-12-09 ENCOUNTER — Other Ambulatory Visit (HOSPITAL_COMMUNITY): Payer: Self-pay | Admitting: Hematology & Oncology

## 2017-04-02 ENCOUNTER — Other Ambulatory Visit (HOSPITAL_COMMUNITY): Payer: Self-pay | Admitting: Oncology

## 2017-04-02 DIAGNOSIS — C210 Malignant neoplasm of anus, unspecified: Secondary | ICD-10-CM

## 2017-04-02 DIAGNOSIS — C7951 Secondary malignant neoplasm of bone: Secondary | ICD-10-CM

## 2017-04-02 DIAGNOSIS — C50912 Malignant neoplasm of unspecified site of left female breast: Secondary | ICD-10-CM

## 2017-04-09 ENCOUNTER — Encounter (HOSPITAL_COMMUNITY)
Admission: RE | Admit: 2017-04-09 | Discharge: 2017-04-09 | Disposition: A | Discharge status: Home / Self Care | Payer: BLUE CROSS/BLUE SHIELD | Source: Ambulatory Visit | Attending: Oncology | Admitting: Oncology

## 2017-04-09 DIAGNOSIS — C7951 Secondary malignant neoplasm of bone: Secondary | ICD-10-CM

## 2017-04-09 DIAGNOSIS — C50912 Malignant neoplasm of unspecified site of left female breast: Secondary | ICD-10-CM

## 2017-04-09 DIAGNOSIS — C210 Malignant neoplasm of anus, unspecified: Secondary | ICD-10-CM | POA: Diagnosis present

## 2017-04-09 LAB — GLUCOSE, CAPILLARY: Glucose-Capillary: 83 mg/dL (ref 65–99)

## 2017-04-09 MED ORDER — FLUDEOXYGLUCOSE F - 18 (FDG) INJECTION
7.1900 | Freq: Once | INTRAVENOUS | Status: AC | PRN
  Administered 2017-04-09: 7.19 via INTRAVENOUS

## 2017-04-15 ENCOUNTER — Other Ambulatory Visit: Payer: Self-pay | Admitting: General Surgery

## 2017-04-22 NOTE — Pre-Procedure Instructions (Signed)
[name]  04/22/2017      [facility] - [place], [place] - [address] AT [address] [place] [address] Phone: [phone] Fax: [phone]    Your procedure is scheduled on Monday, April 26, 2017.  Report to [facility] Admitting at 0930 A.M.  Call this number if you have problems the morning of surgery:  [phone]   Remember:  Do not eat food or drink liquids after midnight.  Continue all other medications as directed by your physician except follow these medication instructions before surgery   Take these medicines the morning of surgery with A SIP OF WATER: Bupropion (Wellbutrin SR) Metoprolol succinate (Toprol-XL) Omeprazole (Prilosec)  7 days prior to surgery STOP taking any Aspirin, Aleve, Naproxen, Ibuprofen, Motrin, Advil, Goody's, BC's, all herbal medications, fish oil, and all vitamins    Do not wear jewelry, make-up or nail polish.  Do not wear lotions, powders, or perfumes, or deodorant.  Do not shave 48 hours prior to surgery.   Do not bring valuables to the hospital.  [facility] is not responsible for any belongings or valuables.  Contacts, eyeglasses, dentures or bridgework may not be worn into surgery.  Leave your suitcase in the car.  After surgery it may be brought to your room.  For patients admitted to the hospital, discharge time will be determined by your treatment team.  Patients discharged the day of surgery will not be allowed to drive home.   Name and phone number of your driver:    Special instructions:   [place]- Preparing For Surgery  Before surgery, you can play an important role. Because skin is not sterile, your skin needs to be as free of germs as possible. You can reduce the number of germs on your skin by washing with CHG (chlorahexidine gluconate) Soap before surgery.  CHG is an antiseptic cleaner which kills germs and bonds with the skin  to continue killing germs even after washing.  Please do not use if you have an allergy to CHG or antibacterial soaps. If your skin becomes reddened/irritated stop using the CHG.  Do not shave (including legs and underarms) for at least 48 hours prior to first CHG shower. It is OK to shave your face.  Please follow these instructions carefully.   1. Shower the NIGHT BEFORE SURGERY and the MORNING OF SURGERY with CHG.   2. If you chose to wash your hair, wash your hair first as usual with your normal shampoo.  3. After you shampoo, rinse your hair and body thoroughly to remove the shampoo.  4. Use CHG as you would any other liquid soap. You can apply CHG directly to the skin and wash gently with a scrungie or a clean washcloth.   5. Apply the CHG Soap to your body ONLY FROM THE NECK DOWN.  Do not use on open wounds or open sores. Avoid contact with your eyes, ears, mouth and genitals (private parts). Wash genitals (private parts) with your normal soap.  6. Wash thoroughly, paying special attention to the area where your surgery will be performed.  7. Thoroughly rinse your body with warm water from the neck down.  8. DO NOT shower/wash with your normal soap after using and rinsing off the CHG Soap.  9. Pat yourself dry with a CLEAN TOWEL.   10. Wear CLEAN PAJAMAS   11. Place CLEAN SHEETS  on your bed the night of your first shower and DO NOT SLEEP WITH PETS.    Day of Surgery: Shower as stated above Do not apply any deodorants/lotions.  Please wear clean clothes to the hospital/surgery center.      Please read over the following fact sheets that you were given. Pain Booklet, Coughing and Deep Breathing and Surgical Site Infection Prevention

## 2017-04-23 ENCOUNTER — Encounter (HOSPITAL_COMMUNITY): Payer: Self-pay

## 2017-04-23 ENCOUNTER — Encounter (HOSPITAL_COMMUNITY)
Admission: RE | Admit: 2017-04-23 | Discharge: 2017-04-23 | Disposition: A | Discharge status: Home / Self Care | Payer: BLUE CROSS/BLUE SHIELD | Source: Ambulatory Visit | Attending: General Surgery | Admitting: General Surgery

## 2017-04-23 DIAGNOSIS — R590 Localized enlarged lymph nodes: Secondary | ICD-10-CM | POA: Diagnosis present

## 2017-04-23 DIAGNOSIS — Z170 Estrogen receptor positive status [ER+]: Secondary | ICD-10-CM | POA: Diagnosis not present

## 2017-04-23 DIAGNOSIS — K219 Gastro-esophageal reflux disease without esophagitis: Secondary | ICD-10-CM | POA: Diagnosis not present

## 2017-04-23 DIAGNOSIS — Z9221 Personal history of antineoplastic chemotherapy: Secondary | ICD-10-CM | POA: Diagnosis not present

## 2017-04-23 DIAGNOSIS — Z9012 Acquired absence of left breast and nipple: Secondary | ICD-10-CM | POA: Diagnosis not present

## 2017-04-23 DIAGNOSIS — C50919 Malignant neoplasm of unspecified site of unspecified female breast: Secondary | ICD-10-CM | POA: Diagnosis not present

## 2017-04-23 DIAGNOSIS — C210 Malignant neoplasm of anus, unspecified: Secondary | ICD-10-CM | POA: Diagnosis not present

## 2017-04-23 DIAGNOSIS — Z79899 Other long term (current) drug therapy: Secondary | ICD-10-CM | POA: Diagnosis not present

## 2017-04-23 DIAGNOSIS — I10 Essential (primary) hypertension: Secondary | ICD-10-CM | POA: Diagnosis not present

## 2017-04-23 DIAGNOSIS — F329 Major depressive disorder, single episode, unspecified: Secondary | ICD-10-CM | POA: Diagnosis not present

## 2017-04-23 DIAGNOSIS — C770 Secondary and unspecified malignant neoplasm of lymph nodes of head, face and neck: Secondary | ICD-10-CM | POA: Diagnosis not present

## 2017-04-23 LAB — HEMOGLOBIN AND HEMATOCRIT, BLOOD
HCT: 40.3 % (ref 36.0–46.0)
Hemoglobin: 13.2 g/dL (ref 12.0–15.0)

## 2017-04-23 NOTE — Progress Notes (Addendum)
PCP: Dr. Delphina Cahill  Cardiologist: pt denies  EKG: 09/28/16 in [facility]  Stress test: 03/2016 in EPIC  ECHO: 06/05/2009 in EPIC  Cardiac Cath: pt denies ever  Chest x-ray: pt denies past year, no recent respiratory complications, infections  CBC and BMET are in [place] from 04/19/17. Hemoglobin and Hematocrit drawn today

## 2017-04-26 ENCOUNTER — Ambulatory Visit (HOSPITAL_COMMUNITY)
Admission: RE | Admit: 2017-04-26 | Discharge: 2017-04-26 | Disposition: A | Discharge status: Home / Self Care | Payer: BLUE CROSS/BLUE SHIELD | Source: Ambulatory Visit | Attending: General Surgery | Admitting: General Surgery

## 2017-04-26 ENCOUNTER — Ambulatory Visit (HOSPITAL_COMMUNITY): Payer: BLUE CROSS/BLUE SHIELD | Admitting: Emergency Medicine

## 2017-04-26 ENCOUNTER — Ambulatory Visit (HOSPITAL_COMMUNITY): Payer: BLUE CROSS/BLUE SHIELD | Admitting: Anesthesiology

## 2017-04-26 ENCOUNTER — Encounter (HOSPITAL_COMMUNITY)
Admission: RE | Disposition: A | Discharge status: Home / Self Care | Payer: Self-pay | Source: Ambulatory Visit | Attending: General Surgery

## 2017-04-26 ENCOUNTER — Encounter (HOSPITAL_COMMUNITY): Payer: Self-pay | Admitting: *Deleted

## 2017-04-26 DIAGNOSIS — C210 Malignant neoplasm of anus, unspecified: Secondary | ICD-10-CM | POA: Insufficient documentation

## 2017-04-26 DIAGNOSIS — C50919 Malignant neoplasm of unspecified site of unspecified female breast: Secondary | ICD-10-CM | POA: Insufficient documentation

## 2017-04-26 DIAGNOSIS — I10 Essential (primary) hypertension: Secondary | ICD-10-CM | POA: Insufficient documentation

## 2017-04-26 DIAGNOSIS — Z170 Estrogen receptor positive status [ER+]: Secondary | ICD-10-CM | POA: Insufficient documentation

## 2017-04-26 DIAGNOSIS — Z9221 Personal history of antineoplastic chemotherapy: Secondary | ICD-10-CM | POA: Insufficient documentation

## 2017-04-26 DIAGNOSIS — K219 Gastro-esophageal reflux disease without esophagitis: Secondary | ICD-10-CM | POA: Insufficient documentation

## 2017-04-26 DIAGNOSIS — Z9012 Acquired absence of left breast and nipple: Secondary | ICD-10-CM | POA: Insufficient documentation

## 2017-04-26 DIAGNOSIS — Z79899 Other long term (current) drug therapy: Secondary | ICD-10-CM | POA: Insufficient documentation

## 2017-04-26 DIAGNOSIS — F329 Major depressive disorder, single episode, unspecified: Secondary | ICD-10-CM | POA: Insufficient documentation

## 2017-04-26 DIAGNOSIS — C770 Secondary and unspecified malignant neoplasm of lymph nodes of head, face and neck: Secondary | ICD-10-CM | POA: Insufficient documentation

## 2017-04-26 HISTORY — PX: LYMPH NODE DISSECTION: SHX5087

## 2017-04-26 SURGERY — LYMPH NODE DISSECTION
Anesthesia: General | Site: Neck | Laterality: Left

## 2017-04-26 MED ORDER — PROPOFOL 10 MG/ML IV BOLUS
INTRAVENOUS | Status: AC
  Filled 2017-04-26: qty 20

## 2017-04-26 MED ORDER — MORPHINE SULFATE (PF) 2 MG/ML IV SOLN: 1.0000 mg | INTRAVENOUS | Status: DC | PRN

## 2017-04-26 MED ORDER — SODIUM CHLORIDE 0.9 % IV SOLN: 250.0000 mL | INTRAVENOUS | Status: DC | PRN

## 2017-04-26 MED ORDER — SUCCINYLCHOLINE CHLORIDE 200 MG/10ML IV SOSY
PREFILLED_SYRINGE | INTRAVENOUS | Status: AC
  Filled 2017-04-26: qty 10

## 2017-04-26 MED ORDER — ACETAMINOPHEN 500 MG PO TABS
1000.0000 mg | ORAL_TABLET | ORAL | Status: AC
  Administered 2017-04-26: 1000 mg via ORAL
  Filled 2017-04-26: qty 2

## 2017-04-26 MED ORDER — LIDOCAINE 2% (20 MG/ML) 5 ML SYRINGE
INTRAMUSCULAR | Status: DC | PRN
  Administered 2017-04-26: 50 mg via INTRAVENOUS

## 2017-04-26 MED ORDER — ROCURONIUM BROMIDE 10 MG/ML (PF) SYRINGE
PREFILLED_SYRINGE | INTRAVENOUS | Status: AC
  Filled 2017-04-26: qty 5

## 2017-04-26 MED ORDER — LACTATED RINGERS IV SOLN
INTRAVENOUS | Status: DC
  Administered 2017-04-26: 10:00:00 via INTRAVENOUS

## 2017-04-26 MED ORDER — FENTANYL CITRATE (PF) 250 MCG/5ML IJ SOLN
INTRAMUSCULAR | Status: AC
  Filled 2017-04-26: qty 5

## 2017-04-26 MED ORDER — EPHEDRINE 5 MG/ML INJ
INTRAVENOUS | Status: AC
  Filled 2017-04-26: qty 10

## 2017-04-26 MED ORDER — ACETAMINOPHEN 325 MG PO TABS: 650.0000 mg | ORAL_TABLET | ORAL | Status: DC | PRN

## 2017-04-26 MED ORDER — ROCURONIUM BROMIDE 100 MG/10ML IV SOLN
INTRAVENOUS | Status: DC | PRN
  Administered 2017-04-26: 40 mg via INTRAVENOUS

## 2017-04-26 MED ORDER — PROMETHAZINE HCL 25 MG/ML IJ SOLN: 6.2500 mg | INTRAMUSCULAR | Status: DC | PRN

## 2017-04-26 MED ORDER — 0.9 % SODIUM CHLORIDE (POUR BTL) OPTIME
TOPICAL | Status: DC | PRN
  Administered 2017-04-26: 1000 mL

## 2017-04-26 MED ORDER — ACETAMINOPHEN 500 MG PO TABS: 1000.0000 mg | ORAL_TABLET | Freq: Four times a day (QID) | ORAL | Status: DC

## 2017-04-26 MED ORDER — MIDAZOLAM HCL 2 MG/2ML IJ SOLN
INTRAMUSCULAR | Status: AC
  Filled 2017-04-26: qty 2

## 2017-04-26 MED ORDER — GABAPENTIN 300 MG PO CAPS
300.0000 mg | ORAL_CAPSULE | ORAL | Status: AC
  Administered 2017-04-26: 300 mg via ORAL
  Filled 2017-04-26: qty 1

## 2017-04-26 MED ORDER — SUGAMMADEX SODIUM 200 MG/2ML IV SOLN
INTRAVENOUS | Status: DC | PRN
  Administered 2017-04-26: 200 mg via INTRAVENOUS

## 2017-04-26 MED ORDER — ONDANSETRON HCL 4 MG/2ML IJ SOLN
INTRAMUSCULAR | Status: DC | PRN
  Administered 2017-04-26: 4 mg via INTRAVENOUS

## 2017-04-26 MED ORDER — SUGAMMADEX SODIUM 200 MG/2ML IV SOLN
INTRAVENOUS | Status: AC
  Filled 2017-04-26: qty 2

## 2017-04-26 MED ORDER — ONDANSETRON HCL 4 MG/2ML IJ SOLN
INTRAMUSCULAR | Status: AC
  Filled 2017-04-26: qty 2

## 2017-04-26 MED ORDER — FENTANYL CITRATE (PF) 100 MCG/2ML IJ SOLN: 25.0000 ug | INTRAMUSCULAR | Status: DC | PRN

## 2017-04-26 MED ORDER — SODIUM CHLORIDE 0.9% FLUSH: 3.0000 mL | Freq: Two times a day (BID) | INTRAVENOUS | Status: DC

## 2017-04-26 MED ORDER — PHENYLEPHRINE 40 MCG/ML (10ML) SYRINGE FOR IV PUSH (FOR BLOOD PRESSURE SUPPORT)
PREFILLED_SYRINGE | INTRAVENOUS | Status: AC
  Filled 2017-04-26: qty 10

## 2017-04-26 MED ORDER — CEFAZOLIN SODIUM-DEXTROSE 2-4 GM/100ML-% IV SOLN
2.0000 g | INTRAVENOUS | Status: AC
  Administered 2017-04-26: 2 g via INTRAVENOUS
  Filled 2017-04-26: qty 100

## 2017-04-26 MED ORDER — BUPIVACAINE-EPINEPHRINE 0.25% -1:200000 IJ SOLN
INTRAMUSCULAR | Status: DC | PRN
  Administered 2017-04-26: 1 mL

## 2017-04-26 MED ORDER — SODIUM CHLORIDE 0.9 % IV SOLN: INTRAVENOUS | Status: DC

## 2017-04-26 MED ORDER — DEXAMETHASONE SODIUM PHOSPHATE 10 MG/ML IJ SOLN
INTRAMUSCULAR | Status: AC
  Filled 2017-04-26: qty 1

## 2017-04-26 MED ORDER — OXYCODONE HCL 5 MG PO TABS: 5.0000 mg | ORAL_TABLET | ORAL | Status: DC | PRN

## 2017-04-26 MED ORDER — ACETAMINOPHEN 650 MG RE SUPP
650.0000 mg | RECTAL | Status: DC | PRN
Start: 2017-04-26 — End: 2017-04-26

## 2017-04-26 MED ORDER — PHENYLEPHRINE 40 MCG/ML (10ML) SYRINGE FOR IV PUSH (FOR BLOOD PRESSURE SUPPORT)
PREFILLED_SYRINGE | INTRAVENOUS | Status: DC | PRN
  Administered 2017-04-26 (×2): 80 ug via INTRAVENOUS

## 2017-04-26 MED ORDER — PROPOFOL 10 MG/ML IV BOLUS
INTRAVENOUS | Status: DC | PRN
  Administered 2017-04-26: 160 mg via INTRAVENOUS

## 2017-04-26 MED ORDER — FENTANYL CITRATE (PF) 100 MCG/2ML IJ SOLN
INTRAMUSCULAR | Status: DC | PRN
  Administered 2017-04-26 (×2): 50 ug via INTRAVENOUS

## 2017-04-26 MED ORDER — LACTATED RINGERS IV SOLN
INTRAVENOUS | Status: DC | PRN
  Administered 2017-04-26: 10:00:00 via INTRAVENOUS

## 2017-04-26 MED ORDER — LIDOCAINE 2% (20 MG/ML) 5 ML SYRINGE
INTRAMUSCULAR | Status: AC
  Filled 2017-04-26: qty 5

## 2017-04-26 MED ORDER — SODIUM CHLORIDE 0.9% FLUSH: 3.0000 mL | INTRAVENOUS | Status: DC | PRN

## 2017-04-26 MED ORDER — MIDAZOLAM HCL 5 MG/5ML IJ SOLN
INTRAMUSCULAR | Status: DC | PRN
  Administered 2017-04-26: 2 mg via INTRAVENOUS

## 2017-04-26 MED ORDER — BUPIVACAINE-EPINEPHRINE (PF) 0.25% -1:200000 IJ SOLN
INTRAMUSCULAR | Status: AC
  Filled 2017-04-26: qty 30

## 2017-04-26 MED ORDER — OXYCODONE HCL 5 MG PO TABS: 5.0000 mg | ORAL_TABLET | Freq: Four times a day (QID) | ORAL | 0 refills | Status: DC | PRN

## 2017-04-26 SURGICAL SUPPLY — 43 items
ADH SKN CLS APL DERMABOND .7 (GAUZE/BANDAGES/DRESSINGS) ×1
APPLIER CLIP 9.375 SM OPEN (CLIP) ×2
APR CLP SM 9.3 20 MLT OPN (CLIP) ×1
BLADE SURG 15 STRL LF DISP TIS (BLADE) IMPLANT
BLADE SURG 15 STRL SS (BLADE) ×2
CHLORAPREP W/TINT 26ML (MISCELLANEOUS) ×2 IMPLANT
CLIP APPLIE 9.375 SM OPEN (CLIP) IMPLANT
CONT SPEC 4OZ CLIKSEAL STRL BL (MISCELLANEOUS) ×2 IMPLANT
COVER SURGICAL LIGHT HANDLE (MISCELLANEOUS) ×2 IMPLANT
DECANTER SPIKE VIAL GLASS SM (MISCELLANEOUS) ×2 IMPLANT
DERMABOND ADVANCED (GAUZE/BANDAGES/DRESSINGS) ×1
DERMABOND ADVANCED .7 DNX12 (GAUZE/BANDAGES/DRESSINGS) ×1 IMPLANT
DRAPE LAPAROTOMY 100X72 PEDS (DRAPES) ×2 IMPLANT
ELECT CAUTERY BLADE 6.4 (BLADE) ×2 IMPLANT
ELECT REM PT RETURN 9FT ADLT (ELECTROSURGICAL) ×2
ELECTRODE REM PT RTRN 9FT ADLT (ELECTROSURGICAL) ×1 IMPLANT
GAUZE SPONGE 4X4 16PLY XRAY LF (GAUZE/BANDAGES/DRESSINGS) ×2 IMPLANT
GLOVE BIO SURGEON STRL SZ7 (GLOVE) ×2 IMPLANT
GLOVE BIOGEL PI IND STRL 7.5 (GLOVE) ×1 IMPLANT
GLOVE BIOGEL PI IND STRL 8 (GLOVE) IMPLANT
GLOVE BIOGEL PI INDICATOR 7.5 (GLOVE) ×1
GLOVE BIOGEL PI INDICATOR 8 (GLOVE) ×1
GLOVE SURG SS PI 8.0 STRL IVOR (GLOVE) ×1 IMPLANT
GOWN STRL REUS W/ TWL LRG LVL3 (GOWN DISPOSABLE) ×2 IMPLANT
GOWN STRL REUS W/ TWL XL LVL3 (GOWN DISPOSABLE) IMPLANT
GOWN STRL REUS W/TWL LRG LVL3 (GOWN DISPOSABLE) ×4
GOWN STRL REUS W/TWL XL LVL3 (GOWN DISPOSABLE) ×2
KIT BASIN OR (CUSTOM PROCEDURE TRAY) ×2 IMPLANT
KIT ROOM TURNOVER OR (KITS) ×2 IMPLANT
NDL HYPO 25GX1X1/2 BEV (NEEDLE) ×1 IMPLANT
NEEDLE HYPO 25GX1X1/2 BEV (NEEDLE) ×2 IMPLANT
NS IRRIG 1000ML POUR BTL (IV SOLUTION) ×2 IMPLANT
PACK SURGICAL SETUP 50X90 (CUSTOM PROCEDURE TRAY) ×2 IMPLANT
PAD ARMBOARD 7.5X6 YLW CONV (MISCELLANEOUS) ×2 IMPLANT
PENCIL BUTTON HOLSTER BLD 10FT (ELECTRODE) ×2 IMPLANT
STAPLER VISISTAT 35W (STAPLE) ×1 IMPLANT
STRIP CLOSURE SKIN 1/2X4 (GAUZE/BANDAGES/DRESSINGS) ×1 IMPLANT
SUT MNCRL AB 4-0 PS2 18 (SUTURE) ×2 IMPLANT
SUT VIC AB 3-0 SH 27 (SUTURE) ×2
SUT VIC AB 3-0 SH 27XBRD (SUTURE) ×1 IMPLANT
SYR CONTROL 10ML LL (SYRINGE) ×2 IMPLANT
TOWEL OR 17X24 6PK STRL BLUE (TOWEL DISPOSABLE) ×2 IMPLANT
TOWEL OR 17X26 10 PK STRL BLUE (TOWEL DISPOSABLE) ×2 IMPLANT

## 2017-04-26 NOTE — Transfer of Care (Signed)
Immediate Anesthesia Transfer of Care Note  Patient: [name]  Procedure(s) Performed: Procedure(s): EXCISION LEFT NECK LYMPH NODE (Left)  Patient Location: PACU  Anesthesia Type:General  Level of Consciousness: awake, oriented and patient cooperative  Airway & Oxygen Therapy: Patient Spontanous Breathing and Patient connected to nasal cannula oxygen  Post-op Assessment: Report given to RN and Post -op Vital signs reviewed and stable  Post vital signs: Reviewed and stable  Last Vitals:  Vitals:   04/26/17 0942 04/26/17 1230  BP: (!) 172/79 123/68  Pulse:    Resp:  16  Temp:  (!) 36.4 C  SpO2:      Last Pain:  Vitals:   04/26/17 0939  TempSrc: Oral      Patients Stated Pain Goal: 3 (02/18/18 7588)  Complications: No apparent anesthesia complications

## 2017-04-26 NOTE — H&P (Signed)
66 yof I know well from treatment for breast cancer. 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 she is overall well. she remains active. she has some new posterior left neck nodes noted by her and her oncologist Dr Neijstrom. she has pet pending at this point. request to biopsy to check prognostic panel to adjust therapy. she has also undergone apr for anal cancer since I saw her last   Past Surgical History (Matthew Wakefield, MD; 04/15/2017 2:55 PM) Sentinel Lymph Node Biopsy  Mastectomy  Left. Spinal Surgery - Neck  Cataract Surgery  Right. Breast Biopsy  Left.  Diagnostic Studies History (Matthew Wakefield, MD; 04/15/2017 2:55 PM) Mammogram  1-3 years ago Colonoscopy  never Pap Smear  1-5 years ago  Allergies (Patricia King, RMA; 04/15/2017 2:22 PM) Ciprofloxacin *CHEMICALS*  tendonitis Allergies Reconciled   Medication History (Patricia King, RMA; 04/15/2017 2:22 PM) Metoprolol Succinate ER (25MG Tablet ER 24HR, Oral) Active. Megestrol Acetate (625MG/5ML Suspension, Oral) Active. BuPROPion HCl ER (XL) (150MG Tablet ER 24HR, Oral daily) Active. Mirtazapine (30MG Tablet, Oral) Active. Nitrofurantoin Macrocrystal (100MG Capsule, Oral) Active. Omeprazole (20MG Capsule DR, Oral) Active. B-12 (1000MCG Capsule, Oral daily) Active. Medications Reconciled  Social History (Matthew Wakefield, MD; 04/15/2017 2:55 PM) Alcohol use  Moderate alcohol use. No drug use  Caffeine use  Tea. Tobacco use  Never smoker.  Family History (Matthew Wakefield, MD; 04/15/2017 2:55 PM) Arthritis  Mother. Heart Disease  Father. Depression  Mother. Migraine Headache  Mother. Hypertension  Mother.  Vitals (Patricia King RMA; 04/15/2017 2:23 PM) 04/15/2017 2:23 PM Weight: 144 lb Height: 65in Body Surface Area: 1.72 m Body Mass Index: 23.96 kg/m  Temp.: 99F  Pulse: 89 (Regular)  BP: 140/85  (Sitting, Left Arm, Standard)  Physical Exam (Matthew Wakefield MD; 04/15/2017 2:52 PM) General Mental Status-Alert. Orientation-Oriented X3. cv rrr Lungs clear Lymphatic Note: left posterior cervical adenopathy, two small nodes just posterior to scm mobile nontender     Assessment & Plan (Matthew Wakefield MD; 04/15/2017 2:55 PM) STAGE IV CARCINOMA OF BREAST (C50.919) CERVICAL LYMPHADENOPATHY (R59.0) Story: discussed excisional biopsy of left posterior neck nodes risks include injury to accessory nerve, bleeding, infection will plan to do asap    

## 2017-04-26 NOTE — Anesthesia Preprocedure Evaluation (Addendum)
Anesthesia Evaluation  Patient identified by MRN, date of birth, ID band Patient awake    Reviewed: Allergy & Precautions, NPO status , Patient's Chart, lab work & pertinent test results  History of Anesthesia Complications Negative for: history of anesthetic complications  Airway Mallampati: II  TM Distance: <3 FB Neck ROM: Full    Dental  (+) Teeth Intact, Dental Advisory Given   Pulmonary neg shortness of breath, neg sleep apnea, neg COPD, neg recent URI,    breath sounds clear to auscultation       Cardiovascular Exercise Tolerance: Good hypertension, Pt. on medications (-) angina+ CAD  (-) CHF (-) dysrhythmias  Rhythm:Regular Rate:Normal     Neuro/Psych PSYCHIATRIC DISORDERS Depression Hx ACDF  Neuromuscular disease    GI/Hepatic Neg liver ROS, GERD  Medicated and Controlled,Anal cancer   Endo/Other  negative endocrine ROS  Renal/GU negative Renal ROS     Musculoskeletal   Abdominal   Peds  Hematology  (+) anemia ,   Anesthesia Other Findings Breast cancer s/p chemotherapy and mastectomy, SCC rectum/anus  Reproductive/Obstetrics                            Anesthesia Physical  Anesthesia Plan  ASA: III  Anesthesia Plan: General   Post-op Pain Management:    Induction: Intravenous  PONV Risk Score and Plan: 4 or greater and Ondansetron, Dexamethasone, Midazolam, Scopolamine patch - Pre-op, Propofol infusion and Treatment may vary due to age or medical condition  Airway Management Planned: Oral ETT  Additional Equipment: None  Intra-op Plan:   Post-operative Plan: Extubation in OR  Informed Consent: I have reviewed the patients History and Physical, chart, labs and discussed the procedure including the risks, benefits and alternatives for the proposed anesthesia with the patient or authorized representative who has indicated his/her understanding and acceptance.   Dental  advisory given  Plan Discussed with: CRNA  Anesthesia Plan Comments:         Anesthesia Quick Evaluation

## 2017-04-26 NOTE — Anesthesia Postprocedure Evaluation (Signed)
Anesthesia Post Note  Patient: [name]  Procedure(s) Performed: Procedure(s) (LRB): EXCISION LEFT NECK LYMPH NODE (Left)     Patient location during evaluation: PACU Anesthesia Type: General Level of consciousness: awake and alert Pain management: pain level controlled Vital Signs Assessment: post-procedure vital signs reviewed and stable Respiratory status: spontaneous breathing, nonlabored ventilation, respiratory function stable and patient connected to nasal cannula oxygen Cardiovascular status: blood pressure returned to baseline and stable Postop Assessment: no apparent nausea or vomiting Anesthetic complications: no    Last Vitals:  Vitals:   04/26/17 1235 04/26/17 1250  BP: 123/68 131/64  Pulse: 80 65  Resp: 16 17  Temp:    SpO2: 100% 100%    Last Pain:  Vitals:   04/26/17 0939  TempSrc: Oral                 Audry Pili

## 2017-04-26 NOTE — Interval H&P Note (Signed)
History and Physical Interval Note:  04/26/2017 11:16 AM  [name]  has presented today for surgery, with the diagnosis of BREAST CANCER  The various methods of treatment have been discussed with the patient and family. After consideration of risks, benefits and other options for treatment, the patient has consented to  Procedure(s): LEFT NECK LYMPH NODE EXCISION (Left) as a surgical intervention .  The patient's history has been reviewed, patient examined, no change in status, stable for surgery.  I have reviewed the patient's chart and labs.  Questions were answered to the patient's satisfaction.     [name]

## 2017-04-26 NOTE — Discharge Instructions (Signed)
[facility] POST OP INSTRUCTIONS  Always review your discharge instruction sheet given to you by the facility where your surgery was performed. IF YOU HAVE DISABILITY OR FAMILY LEAVE FORMS, YOU MUST BRING THEM TO THE OFFICE FOR PROCESSING.   DO NOT GIVE THEM TO YOUR DOCTOR.  1. A prescription for pain medication may be given to you upon discharge.  Take your pain medication as prescribed, if needed.  If narcotic pain medicine is not needed, then you may take acetaminophen (Tylenol), naprosyn (Alleve), or ibuprofen (Advil) as needed. 2. Take your usually prescribed medications unless otherwise directed. 3. If you need a refill on your pain medication, please contact your pharmacy.  They will contact our office to request authorization. Prescriptions will not be filled after 5pm or on week-ends. 4. You should follow a light diet the first few days after arrival home, such as soup and crackers, etc.  Be sure to include lots of fluids daily. 5. Most patients will experience some swelling and bruising in the area of the incisions.  Ice packs will help.  Swelling and bruising can take several days to resolve.  6. It is common to experience some constipation if taking pain medication after surgery.  Increasing fluid intake and taking a stool softener (such as Colace) will usually help or prevent this problem from occurring.  A mild laxative (Milk of Magnesia or Miralax) should be taken according to package instructions if there are no bowel movements after 48 hours. 7. Unless discharge instructions indicate otherwise, you may remove your bandages 48 hours after surgery, and you may shower at that time.  You may have steri-strips (small skin tapes) in place directly over the incision.  These strips should be left on the skin for 7-10 days.  If your surgeon used skin glue on the incision, you may shower in 24 hours.  The glue will flake off over the next 2-3  weeks.  Any sutures or staples will be removed at the office during your follow-up visit. 8. ACTIVITIES:  You may resume regular (light) daily activities beginning the next day--such as daily self-care, walking, climbing stairs--gradually increasing activities as tolerated.  You may have sexual intercourse when it is comfortable.  Refrain from any heavy lifting or straining until approved by your doctor. a. You may drive when you are no longer taking prescription pain medication, you can comfortably wear a seatbelt, and you can safely maneuver your car and apply brakes. b. RETURN TO WORK:  __________________________________________________________ 9. You should see your doctor in the office for a follow-up appointment approximately 2-3 weeks after your surgery.  Make sure that you call for this appointment within a day or two after you arrive home to insure a convenient appointment time. 10. OTHER INSTRUCTIONS: __________________________________________________________________________________________________________________________ __________________________________________________________________________________________________________________________ WHEN TO CALL YOUR DOCTOR: 1. Fever over 101.0 2. Inability to urinate 3. Continued bleeding from incision. 4. Increased pain, redness, or drainage from the incision.   The clinic staff is available to answer your questions during regular business hours.  Please dont hesitate to call and ask to speak to one of the nurses for clinical concerns.  If you have a medical emergency, go to the nearest emergency room or call 911.  A surgeon from [facility] is always on call at the hospital. [address] [place], [place], [place]  [address] ? [address] [place], [place], [place]   [address] [phone] ? [phone] ? FAX [phone] Web site: [email or web address]

## 2017-04-26 NOTE — Anesthesia Procedure Notes (Addendum)
Procedure Name: Intubation Date/Time: 04/26/2017 11:39 AM Performed by: Orlie Dakin Pre-anesthesia Checklist: Patient identified, Emergency Drugs available, Suction available, Patient being monitored and Timeout performed Patient Re-evaluated:Patient Re-evaluated prior to induction Oxygen Delivery Method: Circle system utilized Preoxygenation: Pre-oxygenation with 100% oxygen Induction Type: IV induction Ventilation: Mask ventilation without difficulty Laryngoscope Size: Miller and 3 Grade View: Grade II Tube type: Oral Tube size: 7.5 mm Number of attempts: 1 Airway Equipment and Method: Stylet Placement Confirmation: ETT inserted through vocal cords under direct vision,  positive ETCO2 and breath sounds checked- equal and bilateral Secured at: 23 cm Tube secured with: Tape Dental Injury: Teeth and Oropharynx as per pre-operative assessment  Comments: 4x4s bite block used.

## 2017-04-26 NOTE — Op Note (Signed)
Preoperative diagnosis: Stage IV breast cancer with left posterior cervical lymphadenopathy Postoperative diagnosis: Same as above Procedure: Deep left posterior cervical lymph node excision Surgeon: Dr. Serita Grammes Anesthesia: Gen. Specimens: Left posterior cervical node 2 to pathology Complications: None Estimated blood loss: Minimal Drains: None Sponge and needle count was correct the completion Dictation to recovery in stable condition  Indications: This is 66 year old female well known to me for stage IV breast cancer. She has been treated with numerous therapies. She has new left posterior cervical lymphadenopathy. I discussed with her oncologist excising one of these to get a prognostic panel to further guide therapy. We discussed risks prior to beginning.  Procedure: After informed consent was obtained the patient was taken to the operating room. She was placed under general anesthesia without complication. SCDs were in place. She was given antibiotics. Her neck due to her prior surgery was appropriately positioned and padded. She was then prepped and draped in the standard sterile surgical fashion. Surgical timeout was then performed.  I identified the palpable nodes. I then made a transverse incision in a skin crease in the posterior left neck. I dissected down using scissors. I was able to excise 2 of the lymph nodes in their entirety. I clipped some small draining lymphatics. These were then passed off the table as a specimen. Hemostasis was observed. I then closed this with 3-0 Vicryl, 4-0 Monocryl, and glue. She tolerated this well was extubated and transferred to the recovery room stable.

## 2017-04-27 ENCOUNTER — Encounter (HOSPITAL_COMMUNITY): Payer: Self-pay | Admitting: General Surgery

## 2017-04-28 ENCOUNTER — Other Ambulatory Visit (HOSPITAL_COMMUNITY): Payer: Self-pay | Admitting: Oncology

## 2017-04-28 ENCOUNTER — Other Ambulatory Visit: Payer: Self-pay | Admitting: Oncology

## 2017-04-28 DIAGNOSIS — C7951 Secondary malignant neoplasm of bone: Secondary | ICD-10-CM

## 2017-04-28 DIAGNOSIS — Z170 Estrogen receptor positive status [ER+]: Secondary | ICD-10-CM

## 2017-04-28 DIAGNOSIS — C50912 Malignant neoplasm of unspecified site of left female breast: Secondary | ICD-10-CM

## 2017-04-29 ENCOUNTER — Ambulatory Visit (HOSPITAL_COMMUNITY)
Admission: RE | Admit: 2017-04-29 | Discharge: 2017-04-29 | Disposition: A | Discharge status: Home / Self Care | Payer: BLUE CROSS/BLUE SHIELD | Source: Ambulatory Visit | Attending: Oncology | Admitting: Oncology

## 2017-04-29 DIAGNOSIS — C7951 Secondary malignant neoplasm of bone: Secondary | ICD-10-CM | POA: Insufficient documentation

## 2017-04-29 DIAGNOSIS — X58XXXD Exposure to other specified factors, subsequent encounter: Secondary | ICD-10-CM | POA: Diagnosis not present

## 2017-04-29 DIAGNOSIS — R911 Solitary pulmonary nodule: Secondary | ICD-10-CM | POA: Insufficient documentation

## 2017-04-29 DIAGNOSIS — C50912 Malignant neoplasm of unspecified site of left female breast: Secondary | ICD-10-CM | POA: Diagnosis present

## 2017-04-29 DIAGNOSIS — S2231XD Fracture of one rib, right side, subsequent encounter for fracture with routine healing: Secondary | ICD-10-CM | POA: Diagnosis not present

## 2017-04-29 DIAGNOSIS — S42001D Fracture of unspecified part of right clavicle, subsequent encounter for fracture with routine healing: Secondary | ICD-10-CM | POA: Diagnosis not present

## 2017-04-29 DIAGNOSIS — Z170 Estrogen receptor positive status [ER+]: Secondary | ICD-10-CM

## 2017-09-14 ENCOUNTER — Other Ambulatory Visit (HOSPITAL_COMMUNITY): Payer: Self-pay | Admitting: Oncology

## 2017-09-14 DIAGNOSIS — C50011 Malignant neoplasm of nipple and areola, right female breast: Secondary | ICD-10-CM

## 2017-09-17 ENCOUNTER — Other Ambulatory Visit (HOSPITAL_COMMUNITY): Payer: Self-pay | Admitting: Oncology

## 2017-09-22 ENCOUNTER — Encounter (HOSPITAL_COMMUNITY)
Admission: RE | Admit: 2017-09-22 | Discharge: 2017-09-22 | Disposition: A | Discharge status: Home / Self Care | Payer: BLUE CROSS/BLUE SHIELD | Source: Ambulatory Visit | Attending: Oncology | Admitting: Oncology

## 2017-09-22 DIAGNOSIS — C50011 Malignant neoplasm of nipple and areola, right female breast: Secondary | ICD-10-CM | POA: Diagnosis not present

## 2017-09-22 LAB — GLUCOSE, CAPILLARY: Glucose-Capillary: 86 mg/dL (ref 65–99)

## 2017-09-22 MED ORDER — FLUDEOXYGLUCOSE F - 18 (FDG) INJECTION
7.2000 | Freq: Once | INTRAVENOUS | Status: AC | PRN
  Administered 2017-09-22: 7.2 via INTRAVENOUS

## 2018-02-01 ENCOUNTER — Other Ambulatory Visit (HOSPITAL_COMMUNITY): Payer: Self-pay | Admitting: Oncology

## 2018-02-01 DIAGNOSIS — Z95828 Presence of other vascular implants and grafts: Secondary | ICD-10-CM | POA: Diagnosis not present

## 2018-02-01 DIAGNOSIS — C7951 Secondary malignant neoplasm of bone: Secondary | ICD-10-CM | POA: Diagnosis not present

## 2018-02-01 DIAGNOSIS — Z5111 Encounter for antineoplastic chemotherapy: Secondary | ICD-10-CM | POA: Diagnosis not present

## 2018-02-01 DIAGNOSIS — Z170 Estrogen receptor positive status [ER+]: Secondary | ICD-10-CM | POA: Diagnosis not present

## 2018-02-01 DIAGNOSIS — C50011 Malignant neoplasm of nipple and areola, right female breast: Secondary | ICD-10-CM | POA: Diagnosis not present

## 2018-02-01 DIAGNOSIS — Z9012 Acquired absence of left breast and nipple: Secondary | ICD-10-CM | POA: Diagnosis not present

## 2018-02-01 DIAGNOSIS — C50912 Malignant neoplasm of unspecified site of left female breast: Secondary | ICD-10-CM | POA: Diagnosis not present

## 2018-02-01 DIAGNOSIS — Z85048 Personal history of other malignant neoplasm of rectum, rectosigmoid junction, and anus: Secondary | ICD-10-CM | POA: Diagnosis not present

## 2018-02-01 DIAGNOSIS — C210 Malignant neoplasm of anus, unspecified: Secondary | ICD-10-CM | POA: Diagnosis not present

## 2018-02-07 ENCOUNTER — Encounter (HOSPITAL_COMMUNITY)
Admission: RE | Admit: 2018-02-07 | Discharge: 2018-02-07 | Disposition: A | Discharge status: Home / Self Care | Payer: Medicare Other | Source: Ambulatory Visit | Attending: Oncology | Admitting: Oncology

## 2018-02-07 DIAGNOSIS — C50912 Malignant neoplasm of unspecified site of left female breast: Secondary | ICD-10-CM | POA: Insufficient documentation

## 2018-02-07 DIAGNOSIS — Z170 Estrogen receptor positive status [ER+]: Secondary | ICD-10-CM | POA: Diagnosis not present

## 2018-02-07 LAB — GLUCOSE, CAPILLARY: Glucose-Capillary: 95 mg/dL (ref 70–99)

## 2018-02-07 MED ORDER — FLUDEOXYGLUCOSE F - 18 (FDG) INJECTION
6.5400 | Freq: Once | INTRAVENOUS | Status: AC | PRN
  Administered 2018-02-07: 6.54 via INTRAVENOUS

## 2018-02-21 DIAGNOSIS — I10 Essential (primary) hypertension: Secondary | ICD-10-CM | POA: Diagnosis not present

## 2018-02-21 DIAGNOSIS — C50919 Malignant neoplasm of unspecified site of unspecified female breast: Secondary | ICD-10-CM | POA: Diagnosis not present

## 2018-02-21 DIAGNOSIS — Z6821 Body mass index (BMI) 21.0-21.9, adult: Secondary | ICD-10-CM | POA: Diagnosis not present

## 2018-02-21 DIAGNOSIS — R109 Unspecified abdominal pain: Secondary | ICD-10-CM | POA: Diagnosis not present

## 2018-04-05 DIAGNOSIS — Z95828 Presence of other vascular implants and grafts: Secondary | ICD-10-CM | POA: Diagnosis not present

## 2018-04-05 DIAGNOSIS — Z85048 Personal history of other malignant neoplasm of rectum, rectosigmoid junction, and anus: Secondary | ICD-10-CM | POA: Diagnosis not present

## 2018-04-05 DIAGNOSIS — C50912 Malignant neoplasm of unspecified site of left female breast: Secondary | ICD-10-CM | POA: Diagnosis not present

## 2018-04-05 DIAGNOSIS — K210 Gastro-esophageal reflux disease with esophagitis: Secondary | ICD-10-CM | POA: Diagnosis not present

## 2018-04-05 DIAGNOSIS — C210 Malignant neoplasm of anus, unspecified: Secondary | ICD-10-CM | POA: Diagnosis not present

## 2018-04-05 DIAGNOSIS — K30 Functional dyspepsia: Secondary | ICD-10-CM | POA: Diagnosis not present

## 2018-04-05 DIAGNOSIS — Z170 Estrogen receptor positive status [ER+]: Secondary | ICD-10-CM | POA: Diagnosis not present

## 2018-04-05 DIAGNOSIS — C50011 Malignant neoplasm of nipple and areola, right female breast: Secondary | ICD-10-CM | POA: Diagnosis not present

## 2018-04-05 DIAGNOSIS — C7951 Secondary malignant neoplasm of bone: Secondary | ICD-10-CM | POA: Diagnosis not present

## 2018-04-06 ENCOUNTER — Other Ambulatory Visit (HOSPITAL_COMMUNITY): Payer: Self-pay | Admitting: Oncology

## 2018-04-06 DIAGNOSIS — C50912 Malignant neoplasm of unspecified site of left female breast: Secondary | ICD-10-CM

## 2018-04-21 ENCOUNTER — Other Ambulatory Visit: Payer: Self-pay | Admitting: Nurse Practitioner

## 2018-04-27 ENCOUNTER — Ambulatory Visit (HOSPITAL_COMMUNITY)
Admission: RE | Admit: 2018-04-27 | Discharge: 2018-04-27 | Disposition: A | Discharge status: Home / Self Care | Payer: Medicare Other | Source: Ambulatory Visit | Attending: Oncology | Admitting: Oncology

## 2018-04-27 DIAGNOSIS — Z933 Colostomy status: Secondary | ICD-10-CM | POA: Diagnosis not present

## 2018-04-27 DIAGNOSIS — C50912 Malignant neoplasm of unspecified site of left female breast: Secondary | ICD-10-CM | POA: Insufficient documentation

## 2018-04-27 DIAGNOSIS — I2510 Atherosclerotic heart disease of native coronary artery without angina pectoris: Secondary | ICD-10-CM | POA: Insufficient documentation

## 2018-04-27 DIAGNOSIS — I700 Atherosclerosis of aorta: Secondary | ICD-10-CM | POA: Diagnosis not present

## 2018-04-27 DIAGNOSIS — C50919 Malignant neoplasm of unspecified site of unspecified female breast: Secondary | ICD-10-CM | POA: Diagnosis not present

## 2018-04-27 DIAGNOSIS — M899 Disorder of bone, unspecified: Secondary | ICD-10-CM | POA: Diagnosis not present

## 2018-04-27 LAB — GLUCOSE, CAPILLARY: Glucose-Capillary: 68 mg/dL — ABNORMAL LOW (ref 70–99)

## 2018-04-27 MED ORDER — FLUDEOXYGLUCOSE F - 18 (FDG) INJECTION
6.3200 | Freq: Once | INTRAVENOUS | Status: AC
  Administered 2018-04-27: 6.32 via INTRAVENOUS

## 2018-05-09 DIAGNOSIS — Z170 Estrogen receptor positive status [ER+]: Secondary | ICD-10-CM | POA: Diagnosis not present

## 2018-05-09 DIAGNOSIS — C50912 Malignant neoplasm of unspecified site of left female breast: Secondary | ICD-10-CM | POA: Diagnosis not present

## 2018-05-09 DIAGNOSIS — C50011 Malignant neoplasm of nipple and areola, right female breast: Secondary | ICD-10-CM | POA: Diagnosis not present

## 2018-05-09 DIAGNOSIS — E876 Hypokalemia: Secondary | ICD-10-CM | POA: Diagnosis not present

## 2018-05-09 DIAGNOSIS — K30 Functional dyspepsia: Secondary | ICD-10-CM | POA: Diagnosis not present

## 2018-05-09 DIAGNOSIS — C7951 Secondary malignant neoplasm of bone: Secondary | ICD-10-CM | POA: Diagnosis not present

## 2018-05-09 DIAGNOSIS — C210 Malignant neoplasm of anus, unspecified: Secondary | ICD-10-CM | POA: Diagnosis not present

## 2018-06-06 DIAGNOSIS — Z452 Encounter for adjustment and management of vascular access device: Secondary | ICD-10-CM | POA: Diagnosis not present

## 2018-06-06 DIAGNOSIS — C7951 Secondary malignant neoplasm of bone: Secondary | ICD-10-CM | POA: Diagnosis not present

## 2018-06-06 DIAGNOSIS — Z170 Estrogen receptor positive status [ER+]: Secondary | ICD-10-CM | POA: Diagnosis not present

## 2018-06-06 DIAGNOSIS — C50011 Malignant neoplasm of nipple and areola, right female breast: Secondary | ICD-10-CM | POA: Diagnosis not present

## 2018-06-06 DIAGNOSIS — Z95828 Presence of other vascular implants and grafts: Secondary | ICD-10-CM | POA: Diagnosis not present

## 2018-06-06 DIAGNOSIS — C50912 Malignant neoplasm of unspecified site of left female breast: Secondary | ICD-10-CM | POA: Diagnosis not present

## 2018-06-08 DIAGNOSIS — Z23 Encounter for immunization: Secondary | ICD-10-CM | POA: Diagnosis not present

## 2018-06-30 ENCOUNTER — Other Ambulatory Visit (HOSPITAL_COMMUNITY): Payer: Self-pay | Admitting: Oncology

## 2018-06-30 DIAGNOSIS — C50912 Malignant neoplasm of unspecified site of left female breast: Secondary | ICD-10-CM

## 2018-07-28 ENCOUNTER — Encounter (HOSPITAL_COMMUNITY)
Admission: RE | Admit: 2018-07-28 | Discharge: 2018-07-28 | Disposition: A | Discharge status: Home / Self Care | Payer: Medicare Other | Source: Ambulatory Visit | Attending: Oncology | Admitting: Oncology

## 2018-07-28 DIAGNOSIS — C50912 Malignant neoplasm of unspecified site of left female breast: Secondary | ICD-10-CM | POA: Diagnosis not present

## 2018-07-28 LAB — GLUCOSE, CAPILLARY: Glucose-Capillary: 96 mg/dL (ref 70–99)

## 2018-07-28 MED ORDER — FLUDEOXYGLUCOSE F - 18 (FDG) INJECTION
6.8300 | Freq: Once | INTRAVENOUS | Status: AC
  Administered 2018-07-28: 6.4 via INTRAVENOUS

## 2018-08-01 DIAGNOSIS — C50011 Malignant neoplasm of nipple and areola, right female breast: Secondary | ICD-10-CM | POA: Diagnosis not present

## 2018-08-01 DIAGNOSIS — C50912 Malignant neoplasm of unspecified site of left female breast: Secondary | ICD-10-CM | POA: Diagnosis not present

## 2018-08-01 DIAGNOSIS — Z95828 Presence of other vascular implants and grafts: Secondary | ICD-10-CM | POA: Diagnosis not present

## 2018-08-01 DIAGNOSIS — C50012 Malignant neoplasm of nipple and areola, left female breast: Secondary | ICD-10-CM | POA: Diagnosis not present

## 2018-08-01 DIAGNOSIS — C7951 Secondary malignant neoplasm of bone: Secondary | ICD-10-CM | POA: Diagnosis not present

## 2018-08-01 DIAGNOSIS — Z170 Estrogen receptor positive status [ER+]: Secondary | ICD-10-CM | POA: Diagnosis not present

## 2018-09-27 DIAGNOSIS — Z95828 Presence of other vascular implants and grafts: Secondary | ICD-10-CM | POA: Diagnosis not present

## 2018-09-27 DIAGNOSIS — C20 Malignant neoplasm of rectum: Secondary | ICD-10-CM | POA: Diagnosis not present

## 2018-09-27 DIAGNOSIS — Z170 Estrogen receptor positive status [ER+]: Secondary | ICD-10-CM | POA: Diagnosis not present

## 2018-09-27 DIAGNOSIS — R591 Generalized enlarged lymph nodes: Secondary | ICD-10-CM | POA: Diagnosis not present

## 2018-09-27 DIAGNOSIS — C50011 Malignant neoplasm of nipple and areola, right female breast: Secondary | ICD-10-CM | POA: Diagnosis not present

## 2018-09-27 DIAGNOSIS — C50912 Malignant neoplasm of unspecified site of left female breast: Secondary | ICD-10-CM | POA: Diagnosis not present

## 2018-09-27 DIAGNOSIS — Z452 Encounter for adjustment and management of vascular access device: Secondary | ICD-10-CM | POA: Diagnosis not present

## 2018-09-27 DIAGNOSIS — C7951 Secondary malignant neoplasm of bone: Secondary | ICD-10-CM | POA: Diagnosis not present

## 2018-09-28 DIAGNOSIS — Z7951 Long term (current) use of inhaled steroids: Secondary | ICD-10-CM | POA: Diagnosis not present

## 2018-09-28 DIAGNOSIS — C770 Secondary and unspecified malignant neoplasm of lymph nodes of head, face and neck: Secondary | ICD-10-CM | POA: Diagnosis not present

## 2018-09-28 DIAGNOSIS — C50912 Malignant neoplasm of unspecified site of left female breast: Secondary | ICD-10-CM | POA: Diagnosis not present

## 2018-09-28 DIAGNOSIS — Z881 Allergy status to other antibiotic agents status: Secondary | ICD-10-CM | POA: Diagnosis not present

## 2018-09-28 DIAGNOSIS — R011 Cardiac murmur, unspecified: Secondary | ICD-10-CM | POA: Diagnosis not present

## 2018-09-28 DIAGNOSIS — R590 Localized enlarged lymph nodes: Secondary | ICD-10-CM | POA: Diagnosis not present

## 2018-09-28 DIAGNOSIS — D6959 Other secondary thrombocytopenia: Secondary | ICD-10-CM | POA: Diagnosis not present

## 2018-09-28 DIAGNOSIS — N183 Chronic kidney disease, stage 3 (moderate): Secondary | ICD-10-CM | POA: Diagnosis not present

## 2018-09-28 DIAGNOSIS — D702 Other drug-induced agranulocytosis: Secondary | ICD-10-CM | POA: Diagnosis not present

## 2018-09-28 DIAGNOSIS — I129 Hypertensive chronic kidney disease with stage 1 through stage 4 chronic kidney disease, or unspecified chronic kidney disease: Secondary | ICD-10-CM | POA: Diagnosis not present

## 2018-09-28 DIAGNOSIS — F40240 Claustrophobia: Secondary | ICD-10-CM | POA: Diagnosis not present

## 2018-09-28 DIAGNOSIS — Z853 Personal history of malignant neoplasm of breast: Secondary | ICD-10-CM | POA: Diagnosis not present

## 2018-09-28 DIAGNOSIS — Z79899 Other long term (current) drug therapy: Secondary | ICD-10-CM | POA: Diagnosis not present

## 2018-09-28 IMAGING — CT NM PET TUM IMG RESTAG (PS) SKULL BASE T - THIGH
7 series · 25 of 25 positions shown · non-contrast
Comparison: PET-CT 05/15/2016

CLINICAL DATA: Subsequent treatment strategy for squamous cell anal
carcinoma.. Additional history of LEFT breast cancer.

EXAM:
NUCLEAR MEDICINE PET SKULL BASE TO THIGH
TECHNIQUE: 6.9 mCi F-18 FDG was injected intravenously. Full-ring PET imaging
was performed from the skull base to thigh after the radiotracer. CT
data was obtained and used for attenuation correction and anatomic
localization.
FASTING BLOOD GLUCOSE:  Value: 100 mg/dl

[Series 3: pet sk_thigh ac · axial · 5.0mm · 4.07mm/px · z∈[-900,-32]mm · 5 of 218 slices shown]
[im 1/218]
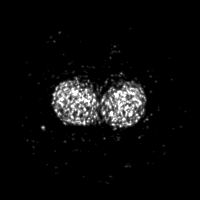
[im 55/218]
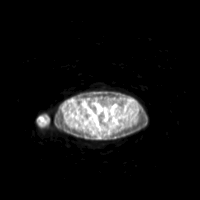
[im 109/218]
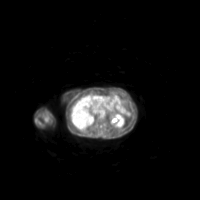
[im 163/218]
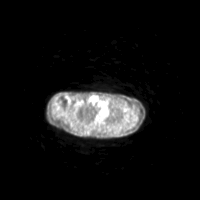
[im 218/218]
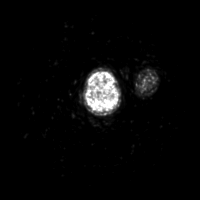

[Series 4: ct sk_thigh 5.0 hd_fov · axial · 5.0mm · 1.12mm/px · z∈[-900,-32]mm · 5 of 218 slices shown]
[im 1/218]
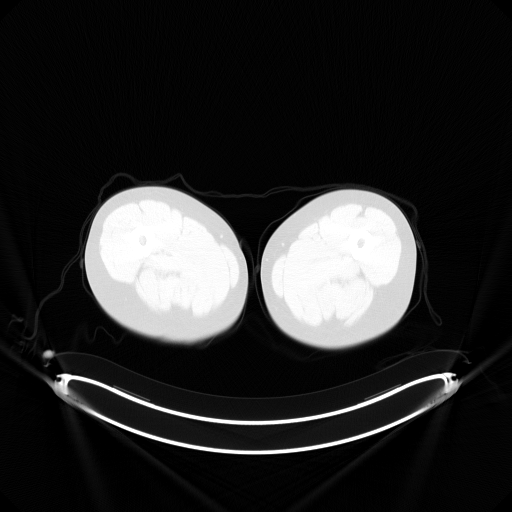
[im 55/218]
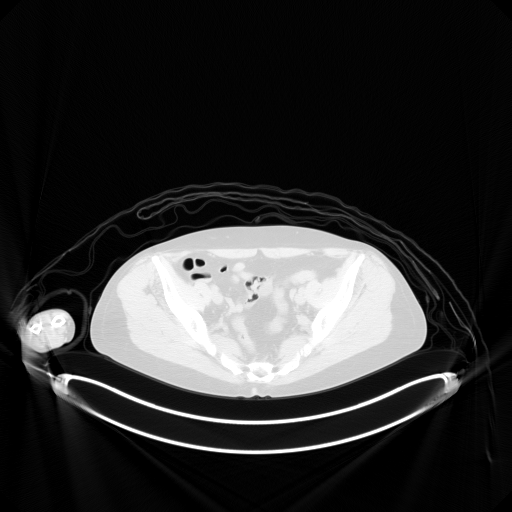
[im 109/218]
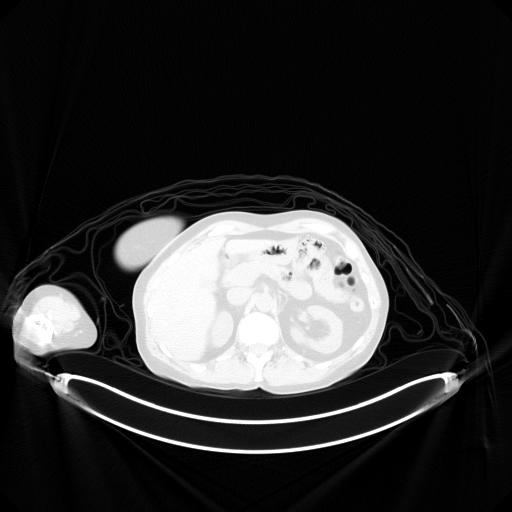
[im 163/218]
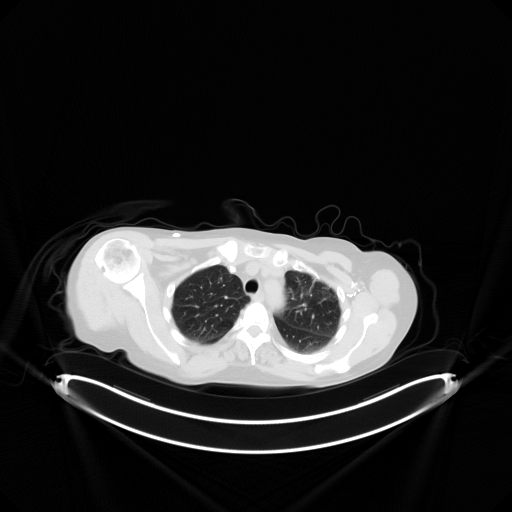
[im 218/218]
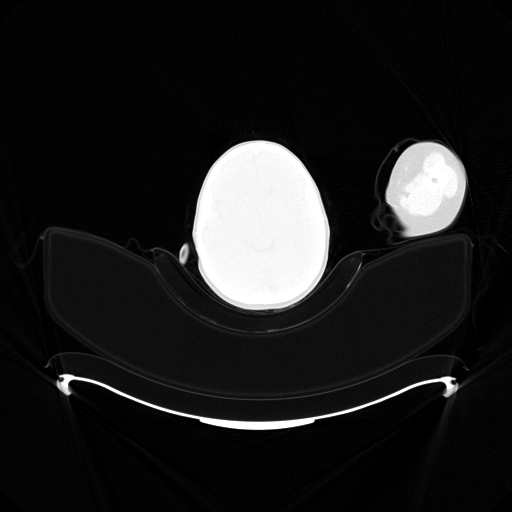

[Series 7: pet sk_thigh nac · axial · 5.0mm · 4.07mm/px · z∈[-900,-32]mm · 5 of 218 slices shown]
[im 1/218]
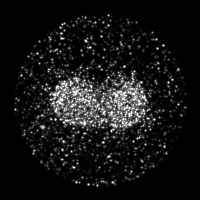
[im 55/218]
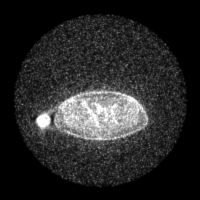
[im 109/218]
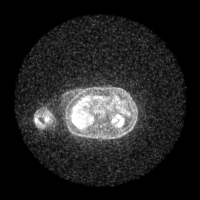
[im 163/218]
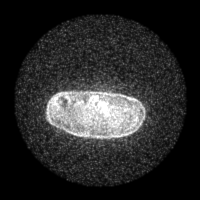
[im 218/218]
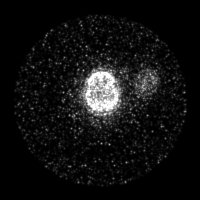

[Series 8: ct sk_thigh 5.0 b70f (id)_bone · axial · 5.0mm · 0.58mm/px · z∈[-430,-178]mm · 2 of 64 slices shown]
[im 1/64  bone]
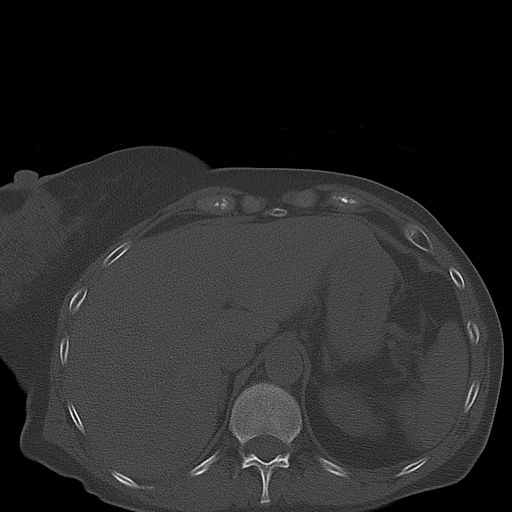
[im 64/64  bone]
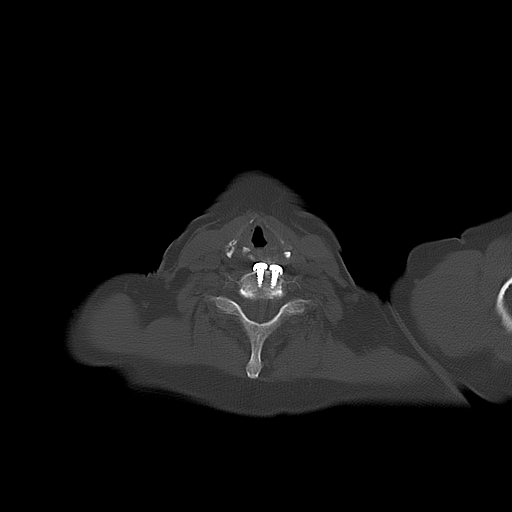

[Series 603: range-ct sk_thigh 5.0 hd_fov-cor-<alpha range> · 2 of 93 slices shown]
[im 1/93]
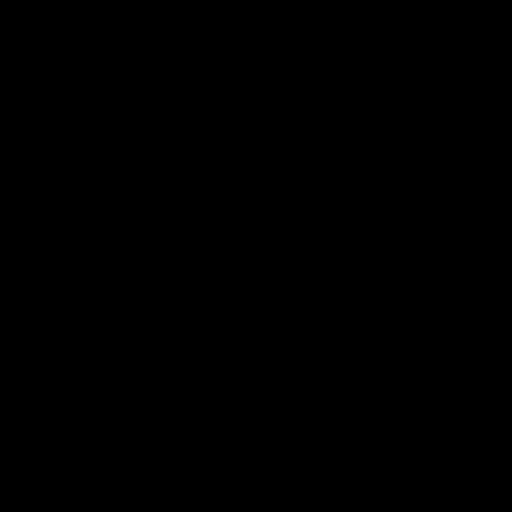
[im 93/93]
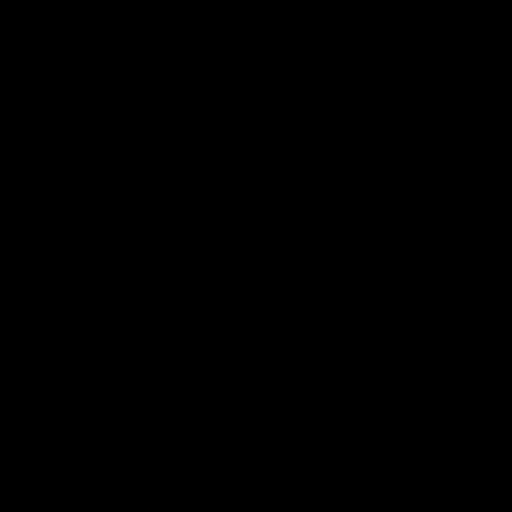

[Series 604: mip collection · coronal · 1.80mm/px · 1 of 32 slices shown]
[im 1/32]
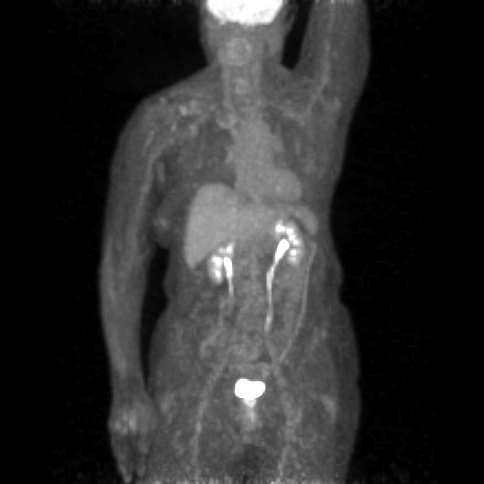

[Series 605: range-ct sk_thigh 5.0 hd_fov-tra-<alpha range> · 5 of 208 slices shown]
[im 1/208]
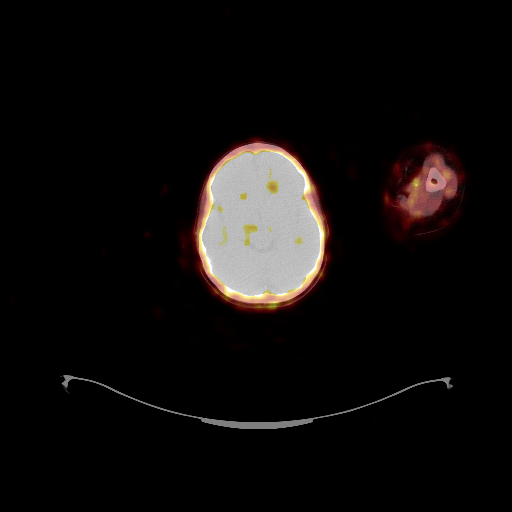
[im 52/208]
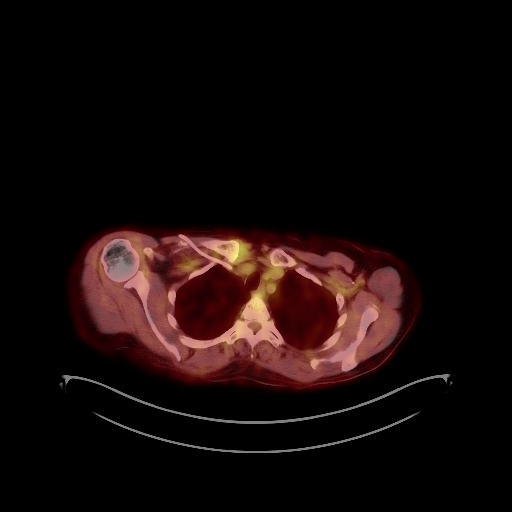
[im 104/208]
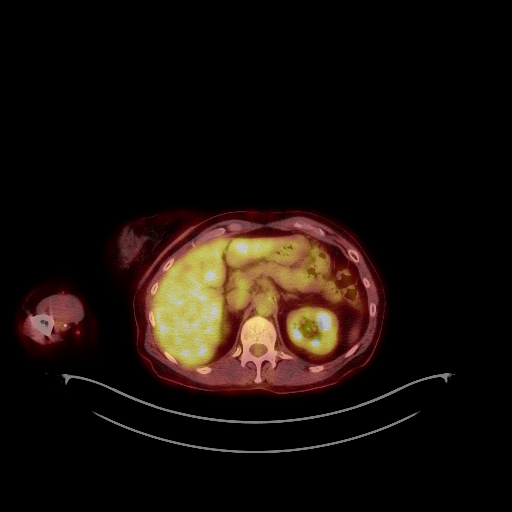
[im 156/208]
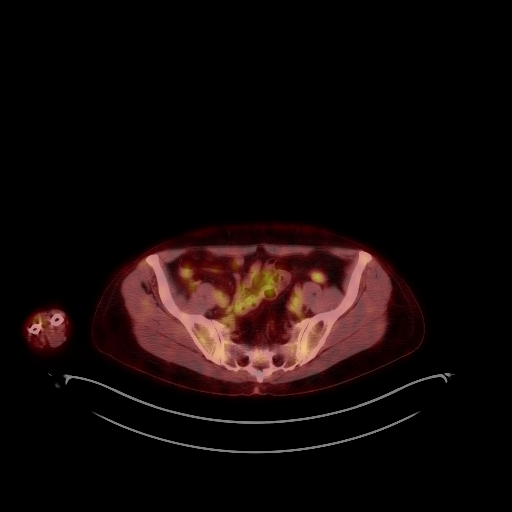
[im 208/208]
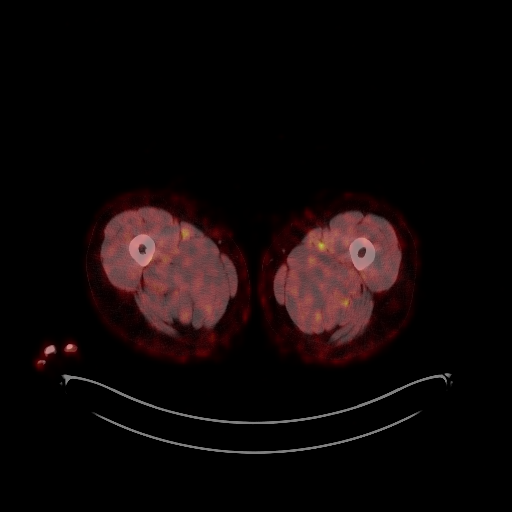

[25 of 25 positions shown; findings below may reference images not displayed]

FINDINGS: NECK

No hypermetabolic lymph nodes in the neck.

CHEST

No hypermetabolic axillary supraclavicular adenopathy. No
hypermetabolic mediastinal adenopathy.

Port in the RIGHT chest.

Angular nodule in the RIGHT upper lobe measures 10 mm. No change
from prior and no metabolic activity.

ABDOMEN/PELVIS

No abnormal hypermetabolic activity within the liver, pancreas,
adrenal glands, or spleen. No hypermetabolic lymph nodes in the
abdomen or pelvis.

Intense metabolic activity at the anorectal junction with associated
mucosal thickening. Single wall thickening measures 12 mm compared
to approximately 9 mm. Metabolic activity associated with an rectal
thickening is mildly increased SUV max equal 12.5 increased from
11.7.

SKELETON

No focal hypermetabolic activity to suggest skeletal metastasis.
Rule decrease in metabolic activity associated with the LEFT
clavicle fracture.
IMPRESSION: 1. Intense metabolic activity associated mucosal thickening in the
anal rectal junction is consistent with residual carcinoma. Mild
increase in metabolic activity.
2. No hypermetabolic lymph nodes in the pelvis.
3. No evidence distant metastatic disease.
4. No evidence breast cancer recurrence.

## 2018-09-30 ENCOUNTER — Other Ambulatory Visit: Payer: Self-pay | Admitting: Oncology

## 2018-09-30 ENCOUNTER — Other Ambulatory Visit (HOSPITAL_COMMUNITY): Payer: Self-pay | Admitting: Oncology

## 2018-09-30 DIAGNOSIS — C50011 Malignant neoplasm of nipple and areola, right female breast: Secondary | ICD-10-CM

## 2018-10-04 DIAGNOSIS — R221 Localized swelling, mass and lump, neck: Secondary | ICD-10-CM | POA: Diagnosis not present

## 2018-10-06 DIAGNOSIS — C50912 Malignant neoplasm of unspecified site of left female breast: Secondary | ICD-10-CM | POA: Diagnosis not present

## 2018-10-06 DIAGNOSIS — C7951 Secondary malignant neoplasm of bone: Secondary | ICD-10-CM | POA: Diagnosis not present

## 2018-10-06 DIAGNOSIS — Z170 Estrogen receptor positive status [ER+]: Secondary | ICD-10-CM | POA: Diagnosis not present

## 2018-10-06 DIAGNOSIS — C50011 Malignant neoplasm of nipple and areola, right female breast: Secondary | ICD-10-CM | POA: Diagnosis not present

## 2018-10-11 ENCOUNTER — Other Ambulatory Visit (HOSPITAL_COMMUNITY): Payer: Medicare Other

## 2018-10-11 DIAGNOSIS — C50912 Malignant neoplasm of unspecified site of left female breast: Secondary | ICD-10-CM | POA: Diagnosis not present

## 2018-10-11 DIAGNOSIS — Z85048 Personal history of other malignant neoplasm of rectum, rectosigmoid junction, and anus: Secondary | ICD-10-CM | POA: Diagnosis not present

## 2018-10-11 DIAGNOSIS — Z5111 Encounter for antineoplastic chemotherapy: Secondary | ICD-10-CM | POA: Diagnosis not present

## 2018-10-11 DIAGNOSIS — Z1501 Genetic susceptibility to malignant neoplasm of breast: Secondary | ICD-10-CM | POA: Diagnosis not present

## 2018-10-11 DIAGNOSIS — Z9012 Acquired absence of left breast and nipple: Secondary | ICD-10-CM | POA: Diagnosis not present

## 2018-10-11 DIAGNOSIS — D701 Agranulocytosis secondary to cancer chemotherapy: Secondary | ICD-10-CM | POA: Diagnosis not present

## 2018-10-11 DIAGNOSIS — T451X5A Adverse effect of antineoplastic and immunosuppressive drugs, initial encounter: Secondary | ICD-10-CM | POA: Diagnosis not present

## 2018-10-11 DIAGNOSIS — C7951 Secondary malignant neoplasm of bone: Secondary | ICD-10-CM | POA: Diagnosis not present

## 2018-10-11 DIAGNOSIS — Z1509 Genetic susceptibility to other malignant neoplasm: Secondary | ICD-10-CM | POA: Diagnosis not present

## 2018-10-11 DIAGNOSIS — R0602 Shortness of breath: Secondary | ICD-10-CM | POA: Diagnosis not present

## 2018-10-12 DIAGNOSIS — C50912 Malignant neoplasm of unspecified site of left female breast: Secondary | ICD-10-CM | POA: Diagnosis not present

## 2018-10-18 DIAGNOSIS — D701 Agranulocytosis secondary to cancer chemotherapy: Secondary | ICD-10-CM | POA: Diagnosis not present

## 2018-10-18 DIAGNOSIS — C7951 Secondary malignant neoplasm of bone: Secondary | ICD-10-CM | POA: Diagnosis not present

## 2018-10-18 DIAGNOSIS — T451X5A Adverse effect of antineoplastic and immunosuppressive drugs, initial encounter: Secondary | ICD-10-CM | POA: Diagnosis not present

## 2018-10-18 DIAGNOSIS — R0602 Shortness of breath: Secondary | ICD-10-CM | POA: Diagnosis not present

## 2018-10-18 DIAGNOSIS — Z5111 Encounter for antineoplastic chemotherapy: Secondary | ICD-10-CM | POA: Diagnosis not present

## 2018-10-18 DIAGNOSIS — C50912 Malignant neoplasm of unspecified site of left female breast: Secondary | ICD-10-CM | POA: Diagnosis not present

## 2018-11-15 ENCOUNTER — Other Ambulatory Visit (HOSPITAL_COMMUNITY): Payer: Self-pay | Admitting: Oncology

## 2018-11-15 DIAGNOSIS — C7951 Secondary malignant neoplasm of bone: Secondary | ICD-10-CM | POA: Diagnosis not present

## 2018-11-15 DIAGNOSIS — I427 Cardiomyopathy due to drug and external agent: Secondary | ICD-10-CM | POA: Diagnosis not present

## 2018-11-15 DIAGNOSIS — D701 Agranulocytosis secondary to cancer chemotherapy: Secondary | ICD-10-CM | POA: Diagnosis not present

## 2018-11-15 DIAGNOSIS — Z5111 Encounter for antineoplastic chemotherapy: Secondary | ICD-10-CM | POA: Diagnosis not present

## 2018-11-15 DIAGNOSIS — C50811 Malignant neoplasm of overlapping sites of right female breast: Secondary | ICD-10-CM | POA: Diagnosis not present

## 2018-11-15 DIAGNOSIS — M79662 Pain in left lower leg: Secondary | ICD-10-CM | POA: Diagnosis not present

## 2018-11-15 DIAGNOSIS — Z170 Estrogen receptor positive status [ER+]: Secondary | ICD-10-CM | POA: Diagnosis not present

## 2018-11-15 DIAGNOSIS — C50912 Malignant neoplasm of unspecified site of left female breast: Secondary | ICD-10-CM | POA: Diagnosis not present

## 2018-11-15 DIAGNOSIS — T451X5A Adverse effect of antineoplastic and immunosuppressive drugs, initial encounter: Secondary | ICD-10-CM | POA: Diagnosis not present

## 2018-11-15 DIAGNOSIS — C50011 Malignant neoplasm of nipple and areola, right female breast: Secondary | ICD-10-CM | POA: Diagnosis not present

## 2018-11-15 DIAGNOSIS — K210 Gastro-esophageal reflux disease with esophagitis: Secondary | ICD-10-CM | POA: Diagnosis not present

## 2018-11-16 ENCOUNTER — Other Ambulatory Visit (HOSPITAL_COMMUNITY): Payer: Medicare Other

## 2018-11-16 ENCOUNTER — Ambulatory Visit (HOSPITAL_COMMUNITY): Payer: Medicare Other

## 2018-11-16 ENCOUNTER — Other Ambulatory Visit: Payer: Self-pay

## 2018-11-16 ENCOUNTER — Ambulatory Visit (HOSPITAL_COMMUNITY)
Admission: RE | Admit: 2018-11-16 | Discharge: 2018-11-16 | Disposition: A | Discharge status: Home / Self Care | Payer: Medicare Other | Source: Ambulatory Visit | Attending: Oncology | Admitting: Oncology

## 2018-11-16 ENCOUNTER — Other Ambulatory Visit (HOSPITAL_COMMUNITY): Payer: Self-pay | Admitting: Oncology

## 2018-11-16 DIAGNOSIS — M79662 Pain in left lower leg: Secondary | ICD-10-CM | POA: Diagnosis not present

## 2018-11-16 DIAGNOSIS — R600 Localized edema: Secondary | ICD-10-CM | POA: Diagnosis not present

## 2018-12-01 ENCOUNTER — Other Ambulatory Visit (HOSPITAL_COMMUNITY): Payer: Self-pay | Admitting: Oncology

## 2018-12-01 DIAGNOSIS — C50912 Malignant neoplasm of unspecified site of left female breast: Secondary | ICD-10-CM

## 2018-12-01 DIAGNOSIS — M79662 Pain in left lower leg: Secondary | ICD-10-CM

## 2018-12-05 ENCOUNTER — Encounter (HOSPITAL_COMMUNITY): Payer: Self-pay

## 2018-12-05 ENCOUNTER — Other Ambulatory Visit: Payer: Self-pay

## 2018-12-05 ENCOUNTER — Ambulatory Visit (HOSPITAL_COMMUNITY)
Admission: RE | Admit: 2018-12-05 | Discharge: 2018-12-05 | Disposition: A | Discharge status: Home / Self Care | Payer: Medicare Other | Source: Ambulatory Visit | Attending: Oncology | Admitting: Oncology

## 2018-12-05 DIAGNOSIS — C50912 Malignant neoplasm of unspecified site of left female breast: Secondary | ICD-10-CM

## 2018-12-05 DIAGNOSIS — M79662 Pain in left lower leg: Secondary | ICD-10-CM

## 2018-12-06 ENCOUNTER — Other Ambulatory Visit (HOSPITAL_COMMUNITY): Payer: Self-pay | Admitting: Oncology

## 2018-12-06 ENCOUNTER — Other Ambulatory Visit: Payer: Self-pay | Admitting: Oncology

## 2018-12-06 DIAGNOSIS — Z09 Encounter for follow-up examination after completed treatment for conditions other than malignant neoplasm: Secondary | ICD-10-CM

## 2018-12-08 ENCOUNTER — Ambulatory Visit (HOSPITAL_COMMUNITY)
Admission: RE | Admit: 2018-12-08 | Discharge: 2018-12-08 | Disposition: A | Discharge status: Home / Self Care | Payer: Medicare Other | Source: Ambulatory Visit | Attending: Oncology | Admitting: Oncology

## 2018-12-08 ENCOUNTER — Other Ambulatory Visit: Payer: Self-pay

## 2018-12-08 DIAGNOSIS — I10 Essential (primary) hypertension: Secondary | ICD-10-CM | POA: Diagnosis not present

## 2018-12-08 DIAGNOSIS — Z09 Encounter for follow-up examination after completed treatment for conditions other than malignant neoplasm: Secondary | ICD-10-CM | POA: Diagnosis not present

## 2018-12-08 DIAGNOSIS — Z853 Personal history of malignant neoplasm of breast: Secondary | ICD-10-CM | POA: Diagnosis not present

## 2018-12-08 DIAGNOSIS — K219 Gastro-esophageal reflux disease without esophagitis: Secondary | ICD-10-CM | POA: Insufficient documentation

## 2018-12-08 NOTE — Progress Notes (Signed)
  Echocardiogram 2D Echocardiogram has been performed.  Darlina Sicilian M 12/08/2018, 9:30 AM

## 2018-12-13 DIAGNOSIS — C50011 Malignant neoplasm of nipple and areola, right female breast: Secondary | ICD-10-CM | POA: Diagnosis not present

## 2018-12-13 DIAGNOSIS — T451X5A Adverse effect of antineoplastic and immunosuppressive drugs, initial encounter: Secondary | ICD-10-CM | POA: Diagnosis not present

## 2018-12-13 DIAGNOSIS — C50912 Malignant neoplasm of unspecified site of left female breast: Secondary | ICD-10-CM | POA: Diagnosis not present

## 2018-12-13 DIAGNOSIS — Z5111 Encounter for antineoplastic chemotherapy: Secondary | ICD-10-CM | POA: Diagnosis not present

## 2018-12-13 DIAGNOSIS — D701 Agranulocytosis secondary to cancer chemotherapy: Secondary | ICD-10-CM | POA: Diagnosis not present

## 2018-12-13 DIAGNOSIS — C7951 Secondary malignant neoplasm of bone: Secondary | ICD-10-CM | POA: Diagnosis not present

## 2019-01-05 DIAGNOSIS — Z6821 Body mass index (BMI) 21.0-21.9, adult: Secondary | ICD-10-CM | POA: Diagnosis not present

## 2019-01-05 DIAGNOSIS — R109 Unspecified abdominal pain: Secondary | ICD-10-CM | POA: Diagnosis not present

## 2019-01-05 DIAGNOSIS — C50919 Malignant neoplasm of unspecified site of unspecified female breast: Secondary | ICD-10-CM | POA: Diagnosis not present

## 2019-01-05 DIAGNOSIS — R944 Abnormal results of kidney function studies: Secondary | ICD-10-CM | POA: Diagnosis not present

## 2019-01-05 DIAGNOSIS — I10 Essential (primary) hypertension: Secondary | ICD-10-CM | POA: Diagnosis not present

## 2019-01-05 DIAGNOSIS — Z23 Encounter for immunization: Secondary | ICD-10-CM | POA: Diagnosis not present

## 2019-01-05 DIAGNOSIS — R7309 Other abnormal glucose: Secondary | ICD-10-CM | POA: Diagnosis not present

## 2019-01-06 DIAGNOSIS — Z0001 Encounter for general adult medical examination with abnormal findings: Secondary | ICD-10-CM | POA: Diagnosis not present

## 2019-01-06 DIAGNOSIS — R944 Abnormal results of kidney function studies: Secondary | ICD-10-CM | POA: Diagnosis not present

## 2019-01-06 DIAGNOSIS — C7951 Secondary malignant neoplasm of bone: Secondary | ICD-10-CM | POA: Diagnosis not present

## 2019-01-06 DIAGNOSIS — Z85038 Personal history of other malignant neoplasm of large intestine: Secondary | ICD-10-CM | POA: Diagnosis not present

## 2019-01-06 DIAGNOSIS — R109 Unspecified abdominal pain: Secondary | ICD-10-CM | POA: Diagnosis not present

## 2019-01-06 DIAGNOSIS — I10 Essential (primary) hypertension: Secondary | ICD-10-CM | POA: Diagnosis not present

## 2019-01-06 DIAGNOSIS — C50919 Malignant neoplasm of unspecified site of unspecified female breast: Secondary | ICD-10-CM | POA: Diagnosis not present

## 2019-01-06 DIAGNOSIS — K219 Gastro-esophageal reflux disease without esophagitis: Secondary | ICD-10-CM | POA: Diagnosis not present

## 2019-01-09 DIAGNOSIS — C50912 Malignant neoplasm of unspecified site of left female breast: Secondary | ICD-10-CM | POA: Diagnosis not present

## 2019-01-09 DIAGNOSIS — C7951 Secondary malignant neoplasm of bone: Secondary | ICD-10-CM | POA: Diagnosis not present

## 2019-01-09 DIAGNOSIS — Z170 Estrogen receptor positive status [ER+]: Secondary | ICD-10-CM | POA: Diagnosis not present

## 2019-01-09 DIAGNOSIS — D701 Agranulocytosis secondary to cancer chemotherapy: Secondary | ICD-10-CM | POA: Diagnosis not present

## 2019-01-09 DIAGNOSIS — T451X5A Adverse effect of antineoplastic and immunosuppressive drugs, initial encounter: Secondary | ICD-10-CM | POA: Diagnosis not present

## 2019-01-09 DIAGNOSIS — I427 Cardiomyopathy due to drug and external agent: Secondary | ICD-10-CM | POA: Diagnosis not present

## 2019-01-09 DIAGNOSIS — Z1501 Genetic susceptibility to malignant neoplasm of breast: Secondary | ICD-10-CM | POA: Diagnosis not present

## 2019-01-09 DIAGNOSIS — Z1509 Genetic susceptibility to other malignant neoplasm: Secondary | ICD-10-CM | POA: Diagnosis not present

## 2019-01-09 DIAGNOSIS — C50011 Malignant neoplasm of nipple and areola, right female breast: Secondary | ICD-10-CM | POA: Diagnosis not present

## 2019-01-09 DIAGNOSIS — Z5111 Encounter for antineoplastic chemotherapy: Secondary | ICD-10-CM | POA: Diagnosis not present

## 2019-01-17 ENCOUNTER — Other Ambulatory Visit (HOSPITAL_COMMUNITY): Payer: Self-pay | Admitting: Oncology

## 2019-01-17 DIAGNOSIS — C50912 Malignant neoplasm of unspecified site of left female breast: Secondary | ICD-10-CM

## 2019-01-25 ENCOUNTER — Other Ambulatory Visit: Payer: Self-pay

## 2019-01-25 ENCOUNTER — Ambulatory Visit (HOSPITAL_COMMUNITY)
Admission: RE | Admit: 2019-01-25 | Discharge: 2019-01-25 | Disposition: A | Discharge status: Home / Self Care | Payer: Medicare Other | Source: Ambulatory Visit | Attending: Oncology | Admitting: Oncology

## 2019-01-25 DIAGNOSIS — C50912 Malignant neoplasm of unspecified site of left female breast: Secondary | ICD-10-CM | POA: Insufficient documentation

## 2019-01-25 LAB — GLUCOSE, CAPILLARY: Glucose-Capillary: 84 mg/dL (ref 70–99)

## 2019-01-25 MED ORDER — FLUDEOXYGLUCOSE F - 18 (FDG) INJECTION
6.2000 | Freq: Once | INTRAVENOUS | Status: AC | PRN
  Administered 2019-01-25: 6.2 via INTRAVENOUS

## 2019-01-30 DIAGNOSIS — T451X5A Adverse effect of antineoplastic and immunosuppressive drugs, initial encounter: Secondary | ICD-10-CM | POA: Diagnosis not present

## 2019-01-30 DIAGNOSIS — C7951 Secondary malignant neoplasm of bone: Secondary | ICD-10-CM | POA: Diagnosis not present

## 2019-01-30 DIAGNOSIS — Z170 Estrogen receptor positive status [ER+]: Secondary | ICD-10-CM | POA: Diagnosis not present

## 2019-01-30 DIAGNOSIS — I427 Cardiomyopathy due to drug and external agent: Secondary | ICD-10-CM | POA: Diagnosis not present

## 2019-01-30 DIAGNOSIS — Z5111 Encounter for antineoplastic chemotherapy: Secondary | ICD-10-CM | POA: Diagnosis not present

## 2019-01-30 DIAGNOSIS — C50912 Malignant neoplasm of unspecified site of left female breast: Secondary | ICD-10-CM | POA: Diagnosis not present

## 2019-01-30 DIAGNOSIS — C50011 Malignant neoplasm of nipple and areola, right female breast: Secondary | ICD-10-CM | POA: Diagnosis not present

## 2019-01-30 DIAGNOSIS — Z1509 Genetic susceptibility to other malignant neoplasm: Secondary | ICD-10-CM | POA: Diagnosis not present

## 2019-01-30 DIAGNOSIS — D701 Agranulocytosis secondary to cancer chemotherapy: Secondary | ICD-10-CM | POA: Diagnosis not present

## 2019-01-30 DIAGNOSIS — Z1501 Genetic susceptibility to malignant neoplasm of breast: Secondary | ICD-10-CM | POA: Diagnosis not present

## 2019-01-31 ENCOUNTER — Other Ambulatory Visit (HOSPITAL_COMMUNITY): Payer: Self-pay | Admitting: Oncology

## 2019-02-02 ENCOUNTER — Other Ambulatory Visit (HOSPITAL_COMMUNITY): Payer: Self-pay

## 2019-02-02 DIAGNOSIS — R609 Edema, unspecified: Secondary | ICD-10-CM

## 2019-02-06 DIAGNOSIS — C7951 Secondary malignant neoplasm of bone: Secondary | ICD-10-CM | POA: Diagnosis not present

## 2019-02-06 DIAGNOSIS — C50011 Malignant neoplasm of nipple and areola, right female breast: Secondary | ICD-10-CM | POA: Diagnosis not present

## 2019-02-06 DIAGNOSIS — Z5111 Encounter for antineoplastic chemotherapy: Secondary | ICD-10-CM | POA: Diagnosis not present

## 2019-02-06 DIAGNOSIS — D701 Agranulocytosis secondary to cancer chemotherapy: Secondary | ICD-10-CM | POA: Diagnosis not present

## 2019-02-06 DIAGNOSIS — T451X5A Adverse effect of antineoplastic and immunosuppressive drugs, initial encounter: Secondary | ICD-10-CM | POA: Diagnosis not present

## 2019-02-06 DIAGNOSIS — C50912 Malignant neoplasm of unspecified site of left female breast: Secondary | ICD-10-CM | POA: Diagnosis not present

## 2019-02-08 ENCOUNTER — Other Ambulatory Visit (HOSPITAL_COMMUNITY): Payer: Medicare Other

## 2019-02-14 ENCOUNTER — Other Ambulatory Visit: Payer: Self-pay

## 2019-02-14 ENCOUNTER — Ambulatory Visit (HOSPITAL_COMMUNITY)
Admission: RE | Admit: 2019-02-14 | Discharge: 2019-02-14 | Disposition: A | Discharge status: Home / Self Care | Payer: Medicare Other | Source: Ambulatory Visit | Attending: Oncology | Admitting: Oncology

## 2019-02-14 DIAGNOSIS — I10 Essential (primary) hypertension: Secondary | ICD-10-CM | POA: Insufficient documentation

## 2019-02-14 DIAGNOSIS — R609 Edema, unspecified: Secondary | ICD-10-CM | POA: Diagnosis not present

## 2019-02-14 DIAGNOSIS — I358 Other nonrheumatic aortic valve disorders: Secondary | ICD-10-CM | POA: Diagnosis not present

## 2019-02-14 NOTE — Progress Notes (Signed)
  Echocardiogram 2D Echocardiogram has been performed.  [name] 02/14/2019, 12:56 PM

## 2019-03-06 DIAGNOSIS — C7951 Secondary malignant neoplasm of bone: Secondary | ICD-10-CM | POA: Diagnosis not present

## 2019-03-06 DIAGNOSIS — Z9012 Acquired absence of left breast and nipple: Secondary | ICD-10-CM | POA: Diagnosis not present

## 2019-03-06 DIAGNOSIS — C50011 Malignant neoplasm of nipple and areola, right female breast: Secondary | ICD-10-CM | POA: Diagnosis not present

## 2019-03-06 DIAGNOSIS — Z170 Estrogen receptor positive status [ER+]: Secondary | ICD-10-CM | POA: Diagnosis not present

## 2019-03-06 DIAGNOSIS — Z5111 Encounter for antineoplastic chemotherapy: Secondary | ICD-10-CM | POA: Diagnosis not present

## 2019-03-06 DIAGNOSIS — Z5112 Encounter for antineoplastic immunotherapy: Secondary | ICD-10-CM | POA: Diagnosis not present

## 2019-03-06 DIAGNOSIS — T451X5A Adverse effect of antineoplastic and immunosuppressive drugs, initial encounter: Secondary | ICD-10-CM | POA: Diagnosis not present

## 2019-03-06 DIAGNOSIS — D701 Agranulocytosis secondary to cancer chemotherapy: Secondary | ICD-10-CM | POA: Diagnosis not present

## 2019-03-06 DIAGNOSIS — C50912 Malignant neoplasm of unspecified site of left female breast: Secondary | ICD-10-CM | POA: Diagnosis not present

## 2019-03-10 ENCOUNTER — Other Ambulatory Visit: Payer: Self-pay | Admitting: Adult Health Nurse Practitioner

## 2019-03-28 DIAGNOSIS — Z5111 Encounter for antineoplastic chemotherapy: Secondary | ICD-10-CM | POA: Diagnosis not present

## 2019-03-28 DIAGNOSIS — C7951 Secondary malignant neoplasm of bone: Secondary | ICD-10-CM | POA: Diagnosis not present

## 2019-03-28 DIAGNOSIS — T451X5A Adverse effect of antineoplastic and immunosuppressive drugs, initial encounter: Secondary | ICD-10-CM | POA: Diagnosis not present

## 2019-03-28 DIAGNOSIS — I427 Cardiomyopathy due to drug and external agent: Secondary | ICD-10-CM | POA: Diagnosis not present

## 2019-03-28 DIAGNOSIS — C50912 Malignant neoplasm of unspecified site of left female breast: Secondary | ICD-10-CM | POA: Diagnosis not present

## 2019-03-28 DIAGNOSIS — C50011 Malignant neoplasm of nipple and areola, right female breast: Secondary | ICD-10-CM | POA: Diagnosis not present

## 2019-04-03 ENCOUNTER — Other Ambulatory Visit (HOSPITAL_COMMUNITY): Payer: Self-pay | Admitting: Oncology

## 2019-04-03 DIAGNOSIS — T451X5A Adverse effect of antineoplastic and immunosuppressive drugs, initial encounter: Secondary | ICD-10-CM

## 2019-04-03 DIAGNOSIS — C50912 Malignant neoplasm of unspecified site of left female breast: Secondary | ICD-10-CM | POA: Diagnosis not present

## 2019-04-03 DIAGNOSIS — C50011 Malignant neoplasm of nipple and areola, right female breast: Secondary | ICD-10-CM | POA: Diagnosis not present

## 2019-04-03 DIAGNOSIS — I427 Cardiomyopathy due to drug and external agent: Secondary | ICD-10-CM | POA: Diagnosis not present

## 2019-04-03 DIAGNOSIS — Z5111 Encounter for antineoplastic chemotherapy: Secondary | ICD-10-CM | POA: Diagnosis not present

## 2019-04-03 DIAGNOSIS — C7951 Secondary malignant neoplasm of bone: Secondary | ICD-10-CM | POA: Diagnosis not present

## 2019-04-24 ENCOUNTER — Ambulatory Visit (HOSPITAL_COMMUNITY)
Admission: RE | Admit: 2019-04-24 | Discharge: 2019-04-24 | Disposition: A | Discharge status: Home / Self Care | Payer: Medicare Other | Source: Ambulatory Visit | Attending: Oncology | Admitting: Oncology

## 2019-04-24 ENCOUNTER — Other Ambulatory Visit: Payer: Self-pay

## 2019-04-24 DIAGNOSIS — I10 Essential (primary) hypertension: Secondary | ICD-10-CM | POA: Insufficient documentation

## 2019-04-24 DIAGNOSIS — I361 Nonrheumatic tricuspid (valve) insufficiency: Secondary | ICD-10-CM | POA: Diagnosis not present

## 2019-04-24 DIAGNOSIS — T887XXA Unspecified adverse effect of drug or medicament, initial encounter: Secondary | ICD-10-CM | POA: Diagnosis not present

## 2019-04-24 DIAGNOSIS — K219 Gastro-esophageal reflux disease without esophagitis: Secondary | ICD-10-CM | POA: Diagnosis not present

## 2019-04-24 DIAGNOSIS — T451X5A Adverse effect of antineoplastic and immunosuppressive drugs, initial encounter: Secondary | ICD-10-CM | POA: Diagnosis not present

## 2019-04-24 DIAGNOSIS — I083 Combined rheumatic disorders of mitral, aortic and tricuspid valves: Secondary | ICD-10-CM | POA: Diagnosis not present

## 2019-04-24 DIAGNOSIS — Y929 Unspecified place or not applicable: Secondary | ICD-10-CM | POA: Diagnosis not present

## 2019-04-24 NOTE — Progress Notes (Signed)
  Echocardiogram 2D Echocardiogram has been performed.  [name] 04/24/2019, 10:57 AM

## 2019-04-25 DIAGNOSIS — C50912 Malignant neoplasm of unspecified site of left female breast: Secondary | ICD-10-CM | POA: Diagnosis not present

## 2019-05-08 ENCOUNTER — Other Ambulatory Visit: Payer: Self-pay | Admitting: Oncology

## 2019-05-08 ENCOUNTER — Other Ambulatory Visit (HOSPITAL_COMMUNITY): Payer: Self-pay | Admitting: Oncology

## 2019-05-08 DIAGNOSIS — C50912 Malignant neoplasm of unspecified site of left female breast: Secondary | ICD-10-CM

## 2019-05-17 ENCOUNTER — Encounter (HOSPITAL_COMMUNITY)
Admission: RE | Admit: 2019-05-17 | Discharge: 2019-05-17 | Disposition: A | Discharge status: Home / Self Care | Payer: Medicare Other | Source: Ambulatory Visit | Attending: Oncology | Admitting: Oncology

## 2019-05-17 ENCOUNTER — Other Ambulatory Visit: Payer: Self-pay

## 2019-05-17 DIAGNOSIS — Z79899 Other long term (current) drug therapy: Secondary | ICD-10-CM | POA: Diagnosis not present

## 2019-05-17 DIAGNOSIS — I700 Atherosclerosis of aorta: Secondary | ICD-10-CM | POA: Diagnosis not present

## 2019-05-17 DIAGNOSIS — Z9012 Acquired absence of left breast and nipple: Secondary | ICD-10-CM | POA: Insufficient documentation

## 2019-05-17 DIAGNOSIS — Z923 Personal history of irradiation: Secondary | ICD-10-CM | POA: Diagnosis not present

## 2019-05-17 DIAGNOSIS — C50912 Malignant neoplasm of unspecified site of left female breast: Secondary | ICD-10-CM | POA: Diagnosis not present

## 2019-05-17 DIAGNOSIS — Z933 Colostomy status: Secondary | ICD-10-CM | POA: Insufficient documentation

## 2019-05-17 DIAGNOSIS — R918 Other nonspecific abnormal finding of lung field: Secondary | ICD-10-CM | POA: Insufficient documentation

## 2019-05-17 LAB — GLUCOSE, CAPILLARY: Glucose-Capillary: 86 mg/dL (ref 70–99)

## 2019-05-17 MED ORDER — FLUDEOXYGLUCOSE F - 18 (FDG) INJECTION
6.3800 | Freq: Once | INTRAVENOUS | Status: AC | PRN
  Administered 2019-05-17: 6.38 via INTRAVENOUS

## 2019-05-30 DIAGNOSIS — Z95828 Presence of other vascular implants and grafts: Secondary | ICD-10-CM | POA: Diagnosis not present

## 2019-05-30 DIAGNOSIS — C7951 Secondary malignant neoplasm of bone: Secondary | ICD-10-CM | POA: Diagnosis not present

## 2019-05-30 DIAGNOSIS — Z85048 Personal history of other malignant neoplasm of rectum, rectosigmoid junction, and anus: Secondary | ICD-10-CM | POA: Diagnosis not present

## 2019-05-30 DIAGNOSIS — C50912 Malignant neoplasm of unspecified site of left female breast: Secondary | ICD-10-CM | POA: Diagnosis not present

## 2019-05-30 DIAGNOSIS — C50011 Malignant neoplasm of nipple and areola, right female breast: Secondary | ICD-10-CM | POA: Diagnosis not present

## 2019-05-30 DIAGNOSIS — Z452 Encounter for adjustment and management of vascular access device: Secondary | ICD-10-CM | POA: Diagnosis not present

## 2019-05-30 DIAGNOSIS — Z170 Estrogen receptor positive status [ER+]: Secondary | ICD-10-CM | POA: Diagnosis not present

## 2019-05-30 DIAGNOSIS — C770 Secondary and unspecified malignant neoplasm of lymph nodes of head, face and neck: Secondary | ICD-10-CM | POA: Diagnosis not present

## 2019-06-06 DIAGNOSIS — Z1283 Encounter for screening for malignant neoplasm of skin: Secondary | ICD-10-CM | POA: Diagnosis not present

## 2019-06-06 DIAGNOSIS — D225 Melanocytic nevi of trunk: Secondary | ICD-10-CM | POA: Diagnosis not present

## 2019-06-06 DIAGNOSIS — L570 Actinic keratosis: Secondary | ICD-10-CM | POA: Diagnosis not present

## 2019-06-06 DIAGNOSIS — X32XXXD Exposure to sunlight, subsequent encounter: Secondary | ICD-10-CM | POA: Diagnosis not present

## 2019-07-12 DIAGNOSIS — Z23 Encounter for immunization: Secondary | ICD-10-CM | POA: Diagnosis not present

## 2019-07-12 DIAGNOSIS — Z85038 Personal history of other malignant neoplasm of large intestine: Secondary | ICD-10-CM | POA: Diagnosis not present

## 2019-07-12 DIAGNOSIS — C50919 Malignant neoplasm of unspecified site of unspecified female breast: Secondary | ICD-10-CM | POA: Diagnosis not present

## 2019-07-12 DIAGNOSIS — I10 Essential (primary) hypertension: Secondary | ICD-10-CM | POA: Diagnosis not present

## 2019-07-12 DIAGNOSIS — C7951 Secondary malignant neoplasm of bone: Secondary | ICD-10-CM | POA: Diagnosis not present

## 2019-07-12 DIAGNOSIS — R944 Abnormal results of kidney function studies: Secondary | ICD-10-CM | POA: Diagnosis not present

## 2019-07-12 DIAGNOSIS — K219 Gastro-esophageal reflux disease without esophagitis: Secondary | ICD-10-CM | POA: Diagnosis not present

## 2019-07-25 DIAGNOSIS — Z95828 Presence of other vascular implants and grafts: Secondary | ICD-10-CM | POA: Diagnosis not present

## 2019-07-25 DIAGNOSIS — Z170 Estrogen receptor positive status [ER+]: Secondary | ICD-10-CM | POA: Diagnosis not present

## 2019-07-25 DIAGNOSIS — C50011 Malignant neoplasm of nipple and areola, right female breast: Secondary | ICD-10-CM | POA: Diagnosis not present

## 2019-07-25 DIAGNOSIS — C7951 Secondary malignant neoplasm of bone: Secondary | ICD-10-CM | POA: Diagnosis not present

## 2019-07-25 DIAGNOSIS — Z5112 Encounter for antineoplastic immunotherapy: Secondary | ICD-10-CM | POA: Diagnosis not present

## 2019-07-25 DIAGNOSIS — Z79899 Other long term (current) drug therapy: Secondary | ICD-10-CM | POA: Diagnosis not present

## 2019-07-25 DIAGNOSIS — C50912 Malignant neoplasm of unspecified site of left female breast: Secondary | ICD-10-CM | POA: Diagnosis not present

## 2019-07-31 DIAGNOSIS — Z170 Estrogen receptor positive status [ER+]: Secondary | ICD-10-CM | POA: Diagnosis not present

## 2019-07-31 DIAGNOSIS — C50011 Malignant neoplasm of nipple and areola, right female breast: Secondary | ICD-10-CM | POA: Diagnosis not present

## 2019-07-31 DIAGNOSIS — Z79899 Other long term (current) drug therapy: Secondary | ICD-10-CM | POA: Diagnosis not present

## 2019-07-31 DIAGNOSIS — Z5112 Encounter for antineoplastic immunotherapy: Secondary | ICD-10-CM | POA: Diagnosis not present

## 2019-07-31 DIAGNOSIS — C7951 Secondary malignant neoplasm of bone: Secondary | ICD-10-CM | POA: Diagnosis not present

## 2019-08-01 ENCOUNTER — Other Ambulatory Visit (HOSPITAL_COMMUNITY): Payer: Self-pay | Admitting: Oncology

## 2019-08-01 ENCOUNTER — Other Ambulatory Visit: Payer: Self-pay | Admitting: Oncology

## 2019-08-01 DIAGNOSIS — C50912 Malignant neoplasm of unspecified site of left female breast: Secondary | ICD-10-CM

## 2019-08-10 ENCOUNTER — Encounter (HOSPITAL_COMMUNITY)
Admission: RE | Admit: 2019-08-10 | Discharge: 2019-08-10 | Disposition: A | Discharge status: Home / Self Care | Payer: Medicare Other | Source: Ambulatory Visit | Attending: Oncology | Admitting: Oncology

## 2019-08-10 ENCOUNTER — Other Ambulatory Visit: Payer: Self-pay

## 2019-08-10 DIAGNOSIS — J90 Pleural effusion, not elsewhere classified: Secondary | ICD-10-CM | POA: Insufficient documentation

## 2019-08-10 DIAGNOSIS — I2510 Atherosclerotic heart disease of native coronary artery without angina pectoris: Secondary | ICD-10-CM | POA: Diagnosis not present

## 2019-08-10 DIAGNOSIS — I700 Atherosclerosis of aorta: Secondary | ICD-10-CM | POA: Insufficient documentation

## 2019-08-10 DIAGNOSIS — C50912 Malignant neoplasm of unspecified site of left female breast: Secondary | ICD-10-CM

## 2019-08-10 DIAGNOSIS — Z933 Colostomy status: Secondary | ICD-10-CM | POA: Diagnosis not present

## 2019-08-10 LAB — GLUCOSE, CAPILLARY: Glucose-Capillary: 91 mg/dL (ref 70–99)

## 2019-08-10 MED ORDER — FLUDEOXYGLUCOSE F - 18 (FDG) INJECTION
6.5000 | Freq: Once | INTRAVENOUS | Status: AC
  Administered 2019-08-10: 6.5 via INTRAVENOUS

## 2019-08-21 DIAGNOSIS — Z170 Estrogen receptor positive status [ER+]: Secondary | ICD-10-CM | POA: Diagnosis not present

## 2019-08-21 DIAGNOSIS — I10 Essential (primary) hypertension: Secondary | ICD-10-CM | POA: Diagnosis not present

## 2019-08-21 DIAGNOSIS — C7951 Secondary malignant neoplasm of bone: Secondary | ICD-10-CM | POA: Diagnosis not present

## 2019-08-21 DIAGNOSIS — C50812 Malignant neoplasm of overlapping sites of left female breast: Secondary | ICD-10-CM | POA: Diagnosis not present

## 2019-08-21 DIAGNOSIS — R011 Cardiac murmur, unspecified: Secondary | ICD-10-CM | POA: Diagnosis not present

## 2019-08-21 DIAGNOSIS — T451X5A Adverse effect of antineoplastic and immunosuppressive drugs, initial encounter: Secondary | ICD-10-CM | POA: Diagnosis not present

## 2019-08-21 DIAGNOSIS — Z79899 Other long term (current) drug therapy: Secondary | ICD-10-CM | POA: Diagnosis not present

## 2019-08-21 DIAGNOSIS — Z5112 Encounter for antineoplastic immunotherapy: Secondary | ICD-10-CM | POA: Diagnosis not present

## 2019-08-21 DIAGNOSIS — D701 Agranulocytosis secondary to cancer chemotherapy: Secondary | ICD-10-CM | POA: Diagnosis not present

## 2019-08-21 DIAGNOSIS — Z95828 Presence of other vascular implants and grafts: Secondary | ICD-10-CM | POA: Diagnosis not present

## 2019-09-11 DIAGNOSIS — R918 Other nonspecific abnormal finding of lung field: Secondary | ICD-10-CM | POA: Diagnosis not present

## 2019-09-11 DIAGNOSIS — T451X5A Adverse effect of antineoplastic and immunosuppressive drugs, initial encounter: Secondary | ICD-10-CM | POA: Diagnosis not present

## 2019-09-11 DIAGNOSIS — D701 Agranulocytosis secondary to cancer chemotherapy: Secondary | ICD-10-CM | POA: Diagnosis not present

## 2019-09-11 DIAGNOSIS — C50912 Malignant neoplasm of unspecified site of left female breast: Secondary | ICD-10-CM | POA: Diagnosis not present

## 2019-09-11 DIAGNOSIS — C7951 Secondary malignant neoplasm of bone: Secondary | ICD-10-CM | POA: Diagnosis not present

## 2019-09-11 DIAGNOSIS — R05 Cough: Secondary | ICD-10-CM | POA: Diagnosis not present

## 2019-09-11 DIAGNOSIS — C50011 Malignant neoplasm of nipple and areola, right female breast: Secondary | ICD-10-CM | POA: Diagnosis not present

## 2019-09-12 ENCOUNTER — Other Ambulatory Visit: Payer: Self-pay

## 2019-09-12 ENCOUNTER — Ambulatory Visit: Payer: Medicare Other | Attending: Internal Medicine

## 2019-09-12 DIAGNOSIS — Z20822 Contact with and (suspected) exposure to covid-19: Secondary | ICD-10-CM | POA: Diagnosis not present

## 2019-09-13 LAB — NOVEL CORONAVIRUS, NAA: SARS-CoV-2, NAA: NOT DETECTED

## 2019-09-15 ENCOUNTER — Other Ambulatory Visit: Payer: Self-pay | Admitting: Internal Medicine

## 2019-09-15 DIAGNOSIS — I10 Essential (primary) hypertension: Secondary | ICD-10-CM | POA: Diagnosis not present

## 2019-09-15 DIAGNOSIS — Z85038 Personal history of other malignant neoplasm of large intestine: Secondary | ICD-10-CM | POA: Diagnosis not present

## 2019-09-15 DIAGNOSIS — I509 Heart failure, unspecified: Secondary | ICD-10-CM | POA: Diagnosis not present

## 2019-09-15 DIAGNOSIS — Z0001 Encounter for general adult medical examination with abnormal findings: Secondary | ICD-10-CM | POA: Diagnosis not present

## 2019-09-15 DIAGNOSIS — J90 Pleural effusion, not elsewhere classified: Secondary | ICD-10-CM

## 2019-09-15 DIAGNOSIS — J918 Pleural effusion in other conditions classified elsewhere: Secondary | ICD-10-CM | POA: Diagnosis not present

## 2019-09-15 DIAGNOSIS — Z1289 Encounter for screening for malignant neoplasm of other sites: Secondary | ICD-10-CM | POA: Diagnosis not present

## 2019-09-15 DIAGNOSIS — R7309 Other abnormal glucose: Secondary | ICD-10-CM | POA: Diagnosis not present

## 2019-09-15 DIAGNOSIS — R0602 Shortness of breath: Secondary | ICD-10-CM

## 2019-09-15 DIAGNOSIS — C7951 Secondary malignant neoplasm of bone: Secondary | ICD-10-CM | POA: Diagnosis not present

## 2019-09-15 DIAGNOSIS — R911 Solitary pulmonary nodule: Secondary | ICD-10-CM

## 2019-09-15 DIAGNOSIS — Z23 Encounter for immunization: Secondary | ICD-10-CM | POA: Diagnosis not present

## 2019-09-15 DIAGNOSIS — C50919 Malignant neoplasm of unspecified site of unspecified female breast: Secondary | ICD-10-CM | POA: Diagnosis not present

## 2019-09-15 DIAGNOSIS — Z6821 Body mass index (BMI) 21.0-21.9, adult: Secondary | ICD-10-CM | POA: Diagnosis not present

## 2019-09-15 DIAGNOSIS — K219 Gastro-esophageal reflux disease without esophagitis: Secondary | ICD-10-CM | POA: Diagnosis not present

## 2019-09-15 DIAGNOSIS — R109 Unspecified abdominal pain: Secondary | ICD-10-CM | POA: Diagnosis not present

## 2019-09-15 DIAGNOSIS — R944 Abnormal results of kidney function studies: Secondary | ICD-10-CM | POA: Diagnosis not present

## 2019-09-18 ENCOUNTER — Other Ambulatory Visit: Payer: Self-pay

## 2019-09-18 ENCOUNTER — Ambulatory Visit (HOSPITAL_COMMUNITY)
Admission: RE | Admit: 2019-09-18 | Discharge: 2019-09-18 | Disposition: A | Discharge status: Home / Self Care | Payer: Medicare Other | Source: Ambulatory Visit | Attending: Internal Medicine | Admitting: Internal Medicine

## 2019-09-18 DIAGNOSIS — R911 Solitary pulmonary nodule: Secondary | ICD-10-CM | POA: Diagnosis not present

## 2019-09-18 DIAGNOSIS — R0602 Shortness of breath: Secondary | ICD-10-CM | POA: Diagnosis not present

## 2019-09-18 DIAGNOSIS — J90 Pleural effusion, not elsewhere classified: Secondary | ICD-10-CM | POA: Diagnosis not present

## 2019-09-27 DIAGNOSIS — R0602 Shortness of breath: Secondary | ICD-10-CM | POA: Diagnosis not present

## 2019-10-02 DIAGNOSIS — Z79899 Other long term (current) drug therapy: Secondary | ICD-10-CM | POA: Diagnosis not present

## 2019-10-02 DIAGNOSIS — Z9012 Acquired absence of left breast and nipple: Secondary | ICD-10-CM | POA: Diagnosis not present

## 2019-10-02 DIAGNOSIS — D701 Agranulocytosis secondary to cancer chemotherapy: Secondary | ICD-10-CM | POA: Diagnosis not present

## 2019-10-02 DIAGNOSIS — Z5112 Encounter for antineoplastic immunotherapy: Secondary | ICD-10-CM | POA: Diagnosis not present

## 2019-10-02 DIAGNOSIS — C50011 Malignant neoplasm of nipple and areola, right female breast: Secondary | ICD-10-CM | POA: Diagnosis not present

## 2019-10-02 DIAGNOSIS — C50912 Malignant neoplasm of unspecified site of left female breast: Secondary | ICD-10-CM | POA: Diagnosis not present

## 2019-10-02 DIAGNOSIS — C7951 Secondary malignant neoplasm of bone: Secondary | ICD-10-CM | POA: Diagnosis not present

## 2019-10-02 DIAGNOSIS — T451X5A Adverse effect of antineoplastic and immunosuppressive drugs, initial encounter: Secondary | ICD-10-CM | POA: Diagnosis not present

## 2019-10-02 DIAGNOSIS — Z170 Estrogen receptor positive status [ER+]: Secondary | ICD-10-CM | POA: Diagnosis not present

## 2019-10-06 DIAGNOSIS — Z23 Encounter for immunization: Secondary | ICD-10-CM | POA: Diagnosis not present

## 2019-10-16 ENCOUNTER — Other Ambulatory Visit (HOSPITAL_COMMUNITY): Payer: Self-pay | Admitting: Oncology

## 2019-10-16 ENCOUNTER — Other Ambulatory Visit: Payer: Self-pay | Admitting: Oncology

## 2019-10-16 DIAGNOSIS — C7951 Secondary malignant neoplasm of bone: Secondary | ICD-10-CM

## 2019-10-16 DIAGNOSIS — C50912 Malignant neoplasm of unspecified site of left female breast: Secondary | ICD-10-CM

## 2019-10-16 DIAGNOSIS — C210 Malignant neoplasm of anus, unspecified: Secondary | ICD-10-CM

## 2019-10-17 DIAGNOSIS — R599 Enlarged lymph nodes, unspecified: Secondary | ICD-10-CM | POA: Diagnosis not present

## 2019-10-17 DIAGNOSIS — C50011 Malignant neoplasm of nipple and areola, right female breast: Secondary | ICD-10-CM | POA: Diagnosis not present

## 2019-10-17 DIAGNOSIS — D701 Agranulocytosis secondary to cancer chemotherapy: Secondary | ICD-10-CM | POA: Diagnosis not present

## 2019-10-17 DIAGNOSIS — C7951 Secondary malignant neoplasm of bone: Secondary | ICD-10-CM | POA: Diagnosis not present

## 2019-10-17 DIAGNOSIS — C20 Malignant neoplasm of rectum: Secondary | ICD-10-CM | POA: Diagnosis not present

## 2019-10-17 DIAGNOSIS — T451X5A Adverse effect of antineoplastic and immunosuppressive drugs, initial encounter: Secondary | ICD-10-CM | POA: Diagnosis not present

## 2019-10-17 DIAGNOSIS — C50912 Malignant neoplasm of unspecified site of left female breast: Secondary | ICD-10-CM | POA: Diagnosis not present

## 2019-10-17 DIAGNOSIS — Z170 Estrogen receptor positive status [ER+]: Secondary | ICD-10-CM | POA: Diagnosis not present

## 2019-10-18 DIAGNOSIS — R590 Localized enlarged lymph nodes: Secondary | ICD-10-CM | POA: Diagnosis not present

## 2019-10-18 DIAGNOSIS — J90 Pleural effusion, not elsewhere classified: Secondary | ICD-10-CM | POA: Diagnosis not present

## 2019-10-18 DIAGNOSIS — R918 Other nonspecific abnormal finding of lung field: Secondary | ICD-10-CM | POA: Diagnosis not present

## 2019-10-18 DIAGNOSIS — C50919 Malignant neoplasm of unspecified site of unspecified female breast: Secondary | ICD-10-CM | POA: Diagnosis not present

## 2019-10-18 DIAGNOSIS — R911 Solitary pulmonary nodule: Secondary | ICD-10-CM | POA: Diagnosis not present

## 2019-10-18 DIAGNOSIS — Z01818 Encounter for other preprocedural examination: Secondary | ICD-10-CM | POA: Diagnosis not present

## 2019-10-18 DIAGNOSIS — R599 Enlarged lymph nodes, unspecified: Secondary | ICD-10-CM | POA: Diagnosis not present

## 2019-10-19 DIAGNOSIS — Z881 Allergy status to other antibiotic agents status: Secondary | ICD-10-CM | POA: Diagnosis not present

## 2019-10-19 DIAGNOSIS — R599 Enlarged lymph nodes, unspecified: Secondary | ICD-10-CM | POA: Diagnosis not present

## 2019-10-19 DIAGNOSIS — Z853 Personal history of malignant neoplasm of breast: Secondary | ICD-10-CM | POA: Diagnosis not present

## 2019-10-19 DIAGNOSIS — K219 Gastro-esophageal reflux disease without esophagitis: Secondary | ICD-10-CM | POA: Diagnosis not present

## 2019-10-19 DIAGNOSIS — I10 Essential (primary) hypertension: Secondary | ICD-10-CM | POA: Diagnosis not present

## 2019-10-19 DIAGNOSIS — Z79899 Other long term (current) drug therapy: Secondary | ICD-10-CM | POA: Diagnosis not present

## 2019-10-19 DIAGNOSIS — C50912 Malignant neoplasm of unspecified site of left female breast: Secondary | ICD-10-CM | POA: Diagnosis not present

## 2019-10-19 DIAGNOSIS — C770 Secondary and unspecified malignant neoplasm of lymph nodes of head, face and neck: Secondary | ICD-10-CM | POA: Diagnosis not present

## 2019-10-19 DIAGNOSIS — R011 Cardiac murmur, unspecified: Secondary | ICD-10-CM | POA: Diagnosis not present

## 2019-10-19 DIAGNOSIS — F329 Major depressive disorder, single episode, unspecified: Secondary | ICD-10-CM | POA: Diagnosis not present

## 2019-10-24 DIAGNOSIS — Z5112 Encounter for antineoplastic immunotherapy: Secondary | ICD-10-CM | POA: Diagnosis not present

## 2019-10-24 DIAGNOSIS — D701 Agranulocytosis secondary to cancer chemotherapy: Secondary | ICD-10-CM | POA: Diagnosis not present

## 2019-10-24 DIAGNOSIS — C7951 Secondary malignant neoplasm of bone: Secondary | ICD-10-CM | POA: Diagnosis not present

## 2019-10-24 DIAGNOSIS — C50011 Malignant neoplasm of nipple and areola, right female breast: Secondary | ICD-10-CM | POA: Diagnosis not present

## 2019-10-24 DIAGNOSIS — T451X5A Adverse effect of antineoplastic and immunosuppressive drugs, initial encounter: Secondary | ICD-10-CM | POA: Diagnosis not present

## 2019-10-24 DIAGNOSIS — Z7689 Persons encountering health services in other specified circumstances: Secondary | ICD-10-CM | POA: Diagnosis not present

## 2019-10-24 DIAGNOSIS — Z79899 Other long term (current) drug therapy: Secondary | ICD-10-CM | POA: Diagnosis not present

## 2019-10-24 DIAGNOSIS — C50912 Malignant neoplasm of unspecified site of left female breast: Secondary | ICD-10-CM | POA: Diagnosis not present

## 2019-10-24 DIAGNOSIS — Z170 Estrogen receptor positive status [ER+]: Secondary | ICD-10-CM | POA: Diagnosis not present

## 2019-10-25 ENCOUNTER — Encounter (HOSPITAL_COMMUNITY): Payer: Medicare Other

## 2019-10-25 ENCOUNTER — Encounter (HOSPITAL_COMMUNITY): Payer: Self-pay

## 2019-11-03 DIAGNOSIS — Z23 Encounter for immunization: Secondary | ICD-10-CM | POA: Diagnosis not present

## 2019-11-14 DIAGNOSIS — Z9012 Acquired absence of left breast and nipple: Secondary | ICD-10-CM | POA: Diagnosis not present

## 2019-11-14 DIAGNOSIS — T451X5A Adverse effect of antineoplastic and immunosuppressive drugs, initial encounter: Secondary | ICD-10-CM | POA: Diagnosis not present

## 2019-11-14 DIAGNOSIS — N3000 Acute cystitis without hematuria: Secondary | ICD-10-CM | POA: Diagnosis not present

## 2019-11-14 DIAGNOSIS — C7951 Secondary malignant neoplasm of bone: Secondary | ICD-10-CM | POA: Diagnosis not present

## 2019-11-14 DIAGNOSIS — R252 Cramp and spasm: Secondary | ICD-10-CM | POA: Diagnosis not present

## 2019-11-14 DIAGNOSIS — Z5111 Encounter for antineoplastic chemotherapy: Secondary | ICD-10-CM | POA: Diagnosis not present

## 2019-11-14 DIAGNOSIS — C50912 Malignant neoplasm of unspecified site of left female breast: Secondary | ICD-10-CM | POA: Diagnosis not present

## 2019-11-14 DIAGNOSIS — Z170 Estrogen receptor positive status [ER+]: Secondary | ICD-10-CM | POA: Diagnosis not present

## 2019-11-14 DIAGNOSIS — D701 Agranulocytosis secondary to cancer chemotherapy: Secondary | ICD-10-CM | POA: Diagnosis not present

## 2019-11-14 DIAGNOSIS — G629 Polyneuropathy, unspecified: Secondary | ICD-10-CM | POA: Diagnosis not present

## 2019-11-14 DIAGNOSIS — C50011 Malignant neoplasm of nipple and areola, right female breast: Secondary | ICD-10-CM | POA: Diagnosis not present

## 2019-12-05 DIAGNOSIS — C7951 Secondary malignant neoplasm of bone: Secondary | ICD-10-CM | POA: Diagnosis not present

## 2019-12-05 DIAGNOSIS — Z5111 Encounter for antineoplastic chemotherapy: Secondary | ICD-10-CM | POA: Diagnosis not present

## 2019-12-05 DIAGNOSIS — C50011 Malignant neoplasm of nipple and areola, right female breast: Secondary | ICD-10-CM | POA: Diagnosis not present

## 2019-12-05 DIAGNOSIS — Z170 Estrogen receptor positive status [ER+]: Secondary | ICD-10-CM | POA: Diagnosis not present

## 2019-12-05 DIAGNOSIS — C50912 Malignant neoplasm of unspecified site of left female breast: Secondary | ICD-10-CM | POA: Diagnosis not present

## 2019-12-05 DIAGNOSIS — D701 Agranulocytosis secondary to cancer chemotherapy: Secondary | ICD-10-CM | POA: Diagnosis not present

## 2019-12-19 DIAGNOSIS — Z5111 Encounter for antineoplastic chemotherapy: Secondary | ICD-10-CM | POA: Diagnosis not present

## 2019-12-19 DIAGNOSIS — C50011 Malignant neoplasm of nipple and areola, right female breast: Secondary | ICD-10-CM | POA: Diagnosis not present

## 2019-12-19 DIAGNOSIS — Z170 Estrogen receptor positive status [ER+]: Secondary | ICD-10-CM | POA: Diagnosis not present

## 2019-12-19 DIAGNOSIS — D701 Agranulocytosis secondary to cancer chemotherapy: Secondary | ICD-10-CM | POA: Diagnosis not present

## 2019-12-19 DIAGNOSIS — C50912 Malignant neoplasm of unspecified site of left female breast: Secondary | ICD-10-CM | POA: Diagnosis not present

## 2019-12-19 DIAGNOSIS — T451X5A Adverse effect of antineoplastic and immunosuppressive drugs, initial encounter: Secondary | ICD-10-CM | POA: Diagnosis not present

## 2019-12-19 DIAGNOSIS — C7951 Secondary malignant neoplasm of bone: Secondary | ICD-10-CM | POA: Diagnosis not present

## 2020-01-10 DIAGNOSIS — Z1504 Genetic susceptibility to malignant neoplasm of endometrium: Secondary | ICD-10-CM | POA: Diagnosis not present

## 2020-01-10 DIAGNOSIS — Z1501 Genetic susceptibility to malignant neoplasm of breast: Secondary | ICD-10-CM | POA: Diagnosis not present

## 2020-01-10 DIAGNOSIS — R591 Generalized enlarged lymph nodes: Secondary | ICD-10-CM | POA: Diagnosis not present

## 2020-01-10 DIAGNOSIS — F329 Major depressive disorder, single episode, unspecified: Secondary | ICD-10-CM | POA: Diagnosis not present

## 2020-01-10 DIAGNOSIS — Z95828 Presence of other vascular implants and grafts: Secondary | ICD-10-CM | POA: Diagnosis not present

## 2020-01-10 DIAGNOSIS — Z5111 Encounter for antineoplastic chemotherapy: Secondary | ICD-10-CM | POA: Diagnosis not present

## 2020-01-10 DIAGNOSIS — R110 Nausea: Secondary | ICD-10-CM | POA: Diagnosis not present

## 2020-01-10 DIAGNOSIS — N1831 Chronic kidney disease, stage 3a: Secondary | ICD-10-CM | POA: Diagnosis not present

## 2020-01-10 DIAGNOSIS — Z85048 Personal history of other malignant neoplasm of rectum, rectosigmoid junction, and anus: Secondary | ICD-10-CM | POA: Diagnosis not present

## 2020-01-10 DIAGNOSIS — G629 Polyneuropathy, unspecified: Secondary | ICD-10-CM | POA: Diagnosis not present

## 2020-01-10 DIAGNOSIS — D6959 Other secondary thrombocytopenia: Secondary | ICD-10-CM | POA: Diagnosis not present

## 2020-01-10 DIAGNOSIS — N959 Unspecified menopausal and perimenopausal disorder: Secondary | ICD-10-CM | POA: Diagnosis not present

## 2020-01-10 DIAGNOSIS — I427 Cardiomyopathy due to drug and external agent: Secondary | ICD-10-CM | POA: Diagnosis not present

## 2020-01-10 DIAGNOSIS — C50912 Malignant neoplasm of unspecified site of left female breast: Secondary | ICD-10-CM | POA: Diagnosis not present

## 2020-01-10 DIAGNOSIS — D701 Agranulocytosis secondary to cancer chemotherapy: Secondary | ICD-10-CM | POA: Diagnosis not present

## 2020-01-10 DIAGNOSIS — I129 Hypertensive chronic kidney disease with stage 1 through stage 4 chronic kidney disease, or unspecified chronic kidney disease: Secondary | ICD-10-CM | POA: Diagnosis not present

## 2020-01-10 DIAGNOSIS — R0602 Shortness of breath: Secondary | ICD-10-CM | POA: Diagnosis not present

## 2020-01-10 DIAGNOSIS — Z1502 Genetic susceptibility to malignant neoplasm of ovary: Secondary | ICD-10-CM | POA: Diagnosis not present

## 2020-01-10 DIAGNOSIS — D7589 Other specified diseases of blood and blood-forming organs: Secondary | ICD-10-CM | POA: Diagnosis not present

## 2020-01-10 DIAGNOSIS — C7951 Secondary malignant neoplasm of bone: Secondary | ICD-10-CM | POA: Diagnosis not present

## 2020-01-10 DIAGNOSIS — T451X5A Adverse effect of antineoplastic and immunosuppressive drugs, initial encounter: Secondary | ICD-10-CM | POA: Diagnosis not present

## 2020-01-30 DIAGNOSIS — C7951 Secondary malignant neoplasm of bone: Secondary | ICD-10-CM | POA: Diagnosis not present

## 2020-01-30 DIAGNOSIS — C50011 Malignant neoplasm of nipple and areola, right female breast: Secondary | ICD-10-CM | POA: Diagnosis not present

## 2020-02-02 DIAGNOSIS — Z5111 Encounter for antineoplastic chemotherapy: Secondary | ICD-10-CM | POA: Diagnosis not present

## 2020-02-02 DIAGNOSIS — C210 Malignant neoplasm of anus, unspecified: Secondary | ICD-10-CM | POA: Diagnosis not present

## 2020-02-02 DIAGNOSIS — C50912 Malignant neoplasm of unspecified site of left female breast: Secondary | ICD-10-CM | POA: Diagnosis not present

## 2020-02-02 DIAGNOSIS — C50012 Malignant neoplasm of nipple and areola, left female breast: Secondary | ICD-10-CM | POA: Diagnosis not present

## 2020-02-02 DIAGNOSIS — Z1509 Genetic susceptibility to other malignant neoplasm: Secondary | ICD-10-CM | POA: Diagnosis not present

## 2020-02-02 DIAGNOSIS — D7589 Other specified diseases of blood and blood-forming organs: Secondary | ICD-10-CM | POA: Diagnosis not present

## 2020-02-02 DIAGNOSIS — Z933 Colostomy status: Secondary | ICD-10-CM | POA: Diagnosis not present

## 2020-02-02 DIAGNOSIS — R0602 Shortness of breath: Secondary | ICD-10-CM | POA: Diagnosis not present

## 2020-02-02 DIAGNOSIS — Z9012 Acquired absence of left breast and nipple: Secondary | ICD-10-CM | POA: Diagnosis not present

## 2020-02-02 DIAGNOSIS — I10 Essential (primary) hypertension: Secondary | ICD-10-CM | POA: Diagnosis not present

## 2020-02-02 DIAGNOSIS — I129 Hypertensive chronic kidney disease with stage 1 through stage 4 chronic kidney disease, or unspecified chronic kidney disease: Secondary | ICD-10-CM | POA: Diagnosis not present

## 2020-02-02 DIAGNOSIS — R591 Generalized enlarged lymph nodes: Secondary | ICD-10-CM | POA: Diagnosis not present

## 2020-02-02 DIAGNOSIS — F329 Major depressive disorder, single episode, unspecified: Secondary | ICD-10-CM | POA: Diagnosis not present

## 2020-02-02 DIAGNOSIS — C7951 Secondary malignant neoplasm of bone: Secondary | ICD-10-CM | POA: Diagnosis not present

## 2020-02-02 DIAGNOSIS — R590 Localized enlarged lymph nodes: Secondary | ICD-10-CM | POA: Diagnosis not present

## 2020-02-02 DIAGNOSIS — Z95828 Presence of other vascular implants and grafts: Secondary | ICD-10-CM | POA: Diagnosis not present

## 2020-02-02 DIAGNOSIS — D701 Agranulocytosis secondary to cancer chemotherapy: Secondary | ICD-10-CM | POA: Diagnosis not present

## 2020-02-02 DIAGNOSIS — G629 Polyneuropathy, unspecified: Secondary | ICD-10-CM | POA: Diagnosis not present

## 2020-02-02 DIAGNOSIS — N1831 Chronic kidney disease, stage 3a: Secondary | ICD-10-CM | POA: Diagnosis not present

## 2020-02-02 DIAGNOSIS — Z170 Estrogen receptor positive status [ER+]: Secondary | ICD-10-CM | POA: Diagnosis not present

## 2020-02-23 DIAGNOSIS — K2100 Gastro-esophageal reflux disease with esophagitis, without bleeding: Secondary | ICD-10-CM | POA: Diagnosis not present

## 2020-02-23 DIAGNOSIS — Z85048 Personal history of other malignant neoplasm of rectum, rectosigmoid junction, and anus: Secondary | ICD-10-CM | POA: Diagnosis not present

## 2020-02-23 DIAGNOSIS — I10 Essential (primary) hypertension: Secondary | ICD-10-CM | POA: Diagnosis not present

## 2020-02-23 DIAGNOSIS — D701 Agranulocytosis secondary to cancer chemotherapy: Secondary | ICD-10-CM | POA: Diagnosis not present

## 2020-02-23 DIAGNOSIS — Z171 Estrogen receptor negative status [ER-]: Secondary | ICD-10-CM | POA: Diagnosis not present

## 2020-02-23 DIAGNOSIS — T451X5A Adverse effect of antineoplastic and immunosuppressive drugs, initial encounter: Secondary | ICD-10-CM | POA: Diagnosis not present

## 2020-02-23 DIAGNOSIS — D7589 Other specified diseases of blood and blood-forming organs: Secondary | ICD-10-CM | POA: Diagnosis not present

## 2020-02-23 DIAGNOSIS — Z95828 Presence of other vascular implants and grafts: Secondary | ICD-10-CM | POA: Diagnosis not present

## 2020-02-23 DIAGNOSIS — Z1501 Genetic susceptibility to malignant neoplasm of breast: Secondary | ICD-10-CM | POA: Diagnosis not present

## 2020-02-23 DIAGNOSIS — C7951 Secondary malignant neoplasm of bone: Secondary | ICD-10-CM | POA: Diagnosis not present

## 2020-02-23 DIAGNOSIS — Z1509 Genetic susceptibility to other malignant neoplasm: Secondary | ICD-10-CM | POA: Diagnosis not present

## 2020-02-23 DIAGNOSIS — K219 Gastro-esophageal reflux disease without esophagitis: Secondary | ICD-10-CM | POA: Diagnosis not present

## 2020-02-23 DIAGNOSIS — I129 Hypertensive chronic kidney disease with stage 1 through stage 4 chronic kidney disease, or unspecified chronic kidney disease: Secondary | ICD-10-CM | POA: Diagnosis not present

## 2020-02-23 DIAGNOSIS — Z1502 Genetic susceptibility to malignant neoplasm of ovary: Secondary | ICD-10-CM | POA: Diagnosis not present

## 2020-02-23 DIAGNOSIS — D6959 Other secondary thrombocytopenia: Secondary | ICD-10-CM | POA: Diagnosis not present

## 2020-02-23 DIAGNOSIS — C50011 Malignant neoplasm of nipple and areola, right female breast: Secondary | ICD-10-CM | POA: Diagnosis not present

## 2020-02-23 DIAGNOSIS — I427 Cardiomyopathy due to drug and external agent: Secondary | ICD-10-CM | POA: Diagnosis not present

## 2020-02-23 DIAGNOSIS — R5382 Chronic fatigue, unspecified: Secondary | ICD-10-CM | POA: Diagnosis not present

## 2020-02-23 DIAGNOSIS — G629 Polyneuropathy, unspecified: Secondary | ICD-10-CM | POA: Diagnosis not present

## 2020-02-23 DIAGNOSIS — Z5111 Encounter for antineoplastic chemotherapy: Secondary | ICD-10-CM | POA: Diagnosis not present

## 2020-02-23 DIAGNOSIS — C50012 Malignant neoplasm of nipple and areola, left female breast: Secondary | ICD-10-CM | POA: Diagnosis not present

## 2020-02-23 DIAGNOSIS — N1831 Chronic kidney disease, stage 3a: Secondary | ICD-10-CM | POA: Diagnosis not present

## 2020-02-23 DIAGNOSIS — C50912 Malignant neoplasm of unspecified site of left female breast: Secondary | ICD-10-CM | POA: Diagnosis not present

## 2020-02-23 DIAGNOSIS — F329 Major depressive disorder, single episode, unspecified: Secondary | ICD-10-CM | POA: Diagnosis not present

## 2020-02-23 DIAGNOSIS — R110 Nausea: Secondary | ICD-10-CM | POA: Diagnosis not present

## 2020-03-15 DIAGNOSIS — I427 Cardiomyopathy due to drug and external agent: Secondary | ICD-10-CM | POA: Diagnosis not present

## 2020-03-15 DIAGNOSIS — Z95828 Presence of other vascular implants and grafts: Secondary | ICD-10-CM | POA: Diagnosis not present

## 2020-03-15 DIAGNOSIS — C210 Malignant neoplasm of anus, unspecified: Secondary | ICD-10-CM | POA: Diagnosis not present

## 2020-03-15 DIAGNOSIS — C50011 Malignant neoplasm of nipple and areola, right female breast: Secondary | ICD-10-CM | POA: Diagnosis not present

## 2020-03-15 DIAGNOSIS — K591 Functional diarrhea: Secondary | ICD-10-CM | POA: Diagnosis not present

## 2020-03-15 DIAGNOSIS — C50912 Malignant neoplasm of unspecified site of left female breast: Secondary | ICD-10-CM | POA: Diagnosis not present

## 2020-03-15 DIAGNOSIS — D7589 Other specified diseases of blood and blood-forming organs: Secondary | ICD-10-CM | POA: Diagnosis not present

## 2020-03-15 DIAGNOSIS — T451X5A Adverse effect of antineoplastic and immunosuppressive drugs, initial encounter: Secondary | ICD-10-CM | POA: Diagnosis not present

## 2020-03-15 DIAGNOSIS — R2689 Other abnormalities of gait and mobility: Secondary | ICD-10-CM | POA: Diagnosis not present

## 2020-03-15 DIAGNOSIS — D6959 Other secondary thrombocytopenia: Secondary | ICD-10-CM | POA: Diagnosis not present

## 2020-03-15 DIAGNOSIS — C7951 Secondary malignant neoplasm of bone: Secondary | ICD-10-CM | POA: Diagnosis not present

## 2020-03-15 DIAGNOSIS — Z1501 Genetic susceptibility to malignant neoplasm of breast: Secondary | ICD-10-CM | POA: Diagnosis not present

## 2020-03-15 DIAGNOSIS — F329 Major depressive disorder, single episode, unspecified: Secondary | ICD-10-CM | POA: Diagnosis not present

## 2020-03-15 DIAGNOSIS — D701 Agranulocytosis secondary to cancer chemotherapy: Secondary | ICD-10-CM | POA: Diagnosis not present

## 2020-03-15 DIAGNOSIS — G629 Polyneuropathy, unspecified: Secondary | ICD-10-CM | POA: Diagnosis not present

## 2020-03-15 DIAGNOSIS — R110 Nausea: Secondary | ICD-10-CM | POA: Diagnosis not present

## 2020-03-15 DIAGNOSIS — R194 Change in bowel habit: Secondary | ICD-10-CM | POA: Diagnosis not present

## 2020-03-15 DIAGNOSIS — N1831 Chronic kidney disease, stage 3a: Secondary | ICD-10-CM | POA: Diagnosis not present

## 2020-03-15 DIAGNOSIS — R42 Dizziness and giddiness: Secondary | ICD-10-CM | POA: Diagnosis not present

## 2020-03-18 DIAGNOSIS — R2689 Other abnormalities of gait and mobility: Secondary | ICD-10-CM | POA: Diagnosis not present

## 2020-03-18 DIAGNOSIS — R42 Dizziness and giddiness: Secondary | ICD-10-CM | POA: Diagnosis not present

## 2020-03-18 DIAGNOSIS — C50011 Malignant neoplasm of nipple and areola, right female breast: Secondary | ICD-10-CM | POA: Diagnosis not present

## 2020-03-18 DIAGNOSIS — Z853 Personal history of malignant neoplasm of breast: Secondary | ICD-10-CM | POA: Diagnosis not present

## 2020-03-18 DIAGNOSIS — C50912 Malignant neoplasm of unspecified site of left female breast: Secondary | ICD-10-CM | POA: Diagnosis not present

## 2020-03-18 DIAGNOSIS — C7951 Secondary malignant neoplasm of bone: Secondary | ICD-10-CM | POA: Diagnosis not present

## 2020-03-20 DIAGNOSIS — K30 Functional dyspepsia: Secondary | ICD-10-CM | POA: Diagnosis not present

## 2020-03-20 DIAGNOSIS — D701 Agranulocytosis secondary to cancer chemotherapy: Secondary | ICD-10-CM | POA: Diagnosis not present

## 2020-03-20 DIAGNOSIS — I10 Essential (primary) hypertension: Secondary | ICD-10-CM | POA: Diagnosis not present

## 2020-03-20 DIAGNOSIS — D6959 Other secondary thrombocytopenia: Secondary | ICD-10-CM | POA: Diagnosis not present

## 2020-03-20 DIAGNOSIS — C210 Malignant neoplasm of anus, unspecified: Secondary | ICD-10-CM | POA: Diagnosis not present

## 2020-03-20 DIAGNOSIS — D7589 Other specified diseases of blood and blood-forming organs: Secondary | ICD-10-CM | POA: Diagnosis not present

## 2020-03-20 DIAGNOSIS — F329 Major depressive disorder, single episode, unspecified: Secondary | ICD-10-CM | POA: Diagnosis not present

## 2020-03-20 DIAGNOSIS — C50011 Malignant neoplasm of nipple and areola, right female breast: Secondary | ICD-10-CM | POA: Diagnosis not present

## 2020-03-20 DIAGNOSIS — C7951 Secondary malignant neoplasm of bone: Secondary | ICD-10-CM | POA: Diagnosis not present

## 2020-03-20 DIAGNOSIS — Z5111 Encounter for antineoplastic chemotherapy: Secondary | ICD-10-CM | POA: Diagnosis not present

## 2020-03-20 DIAGNOSIS — R42 Dizziness and giddiness: Secondary | ICD-10-CM | POA: Diagnosis not present

## 2020-03-20 DIAGNOSIS — G629 Polyneuropathy, unspecified: Secondary | ICD-10-CM | POA: Diagnosis not present

## 2020-03-20 DIAGNOSIS — K591 Functional diarrhea: Secondary | ICD-10-CM | POA: Diagnosis not present

## 2020-03-20 DIAGNOSIS — R110 Nausea: Secondary | ICD-10-CM | POA: Diagnosis not present

## 2020-03-20 DIAGNOSIS — N1831 Chronic kidney disease, stage 3a: Secondary | ICD-10-CM | POA: Diagnosis not present

## 2020-03-20 DIAGNOSIS — I427 Cardiomyopathy due to drug and external agent: Secondary | ICD-10-CM | POA: Diagnosis not present

## 2020-04-08 DIAGNOSIS — Z1509 Genetic susceptibility to other malignant neoplasm: Secondary | ICD-10-CM | POA: Diagnosis not present

## 2020-04-08 DIAGNOSIS — Z1501 Genetic susceptibility to malignant neoplasm of breast: Secondary | ICD-10-CM | POA: Diagnosis not present

## 2020-04-08 DIAGNOSIS — C50919 Malignant neoplasm of unspecified site of unspecified female breast: Secondary | ICD-10-CM | POA: Diagnosis not present

## 2020-04-08 DIAGNOSIS — Z170 Estrogen receptor positive status [ER+]: Secondary | ICD-10-CM | POA: Diagnosis not present

## 2020-04-08 DIAGNOSIS — R918 Other nonspecific abnormal finding of lung field: Secondary | ICD-10-CM | POA: Diagnosis not present

## 2020-04-08 DIAGNOSIS — C7951 Secondary malignant neoplasm of bone: Secondary | ICD-10-CM | POA: Diagnosis not present

## 2020-04-08 DIAGNOSIS — N3289 Other specified disorders of bladder: Secondary | ICD-10-CM | POA: Diagnosis not present

## 2020-04-08 DIAGNOSIS — C50012 Malignant neoplasm of nipple and areola, left female breast: Secondary | ICD-10-CM | POA: Diagnosis not present

## 2020-04-17 DIAGNOSIS — C50912 Malignant neoplasm of unspecified site of left female breast: Secondary | ICD-10-CM | POA: Diagnosis not present

## 2020-04-17 DIAGNOSIS — Z1501 Genetic susceptibility to malignant neoplasm of breast: Secondary | ICD-10-CM | POA: Diagnosis not present

## 2020-04-17 DIAGNOSIS — R5382 Chronic fatigue, unspecified: Secondary | ICD-10-CM | POA: Diagnosis not present

## 2020-04-17 DIAGNOSIS — R0602 Shortness of breath: Secondary | ICD-10-CM | POA: Diagnosis not present

## 2020-04-17 DIAGNOSIS — I129 Hypertensive chronic kidney disease with stage 1 through stage 4 chronic kidney disease, or unspecified chronic kidney disease: Secondary | ICD-10-CM | POA: Diagnosis not present

## 2020-04-17 DIAGNOSIS — I10 Essential (primary) hypertension: Secondary | ICD-10-CM | POA: Diagnosis not present

## 2020-04-17 DIAGNOSIS — Z5112 Encounter for antineoplastic immunotherapy: Secondary | ICD-10-CM | POA: Diagnosis not present

## 2020-04-17 DIAGNOSIS — T451X5A Adverse effect of antineoplastic and immunosuppressive drugs, initial encounter: Secondary | ICD-10-CM | POA: Diagnosis not present

## 2020-04-17 DIAGNOSIS — I427 Cardiomyopathy due to drug and external agent: Secondary | ICD-10-CM | POA: Diagnosis not present

## 2020-04-17 DIAGNOSIS — R252 Cramp and spasm: Secondary | ICD-10-CM | POA: Diagnosis not present

## 2020-04-17 DIAGNOSIS — Z5111 Encounter for antineoplastic chemotherapy: Secondary | ICD-10-CM | POA: Diagnosis not present

## 2020-04-17 DIAGNOSIS — G479 Sleep disorder, unspecified: Secondary | ICD-10-CM | POA: Diagnosis not present

## 2020-04-17 DIAGNOSIS — G629 Polyneuropathy, unspecified: Secondary | ICD-10-CM | POA: Diagnosis not present

## 2020-04-17 DIAGNOSIS — D7589 Other specified diseases of blood and blood-forming organs: Secondary | ICD-10-CM | POA: Diagnosis not present

## 2020-04-17 DIAGNOSIS — Z95828 Presence of other vascular implants and grafts: Secondary | ICD-10-CM | POA: Diagnosis not present

## 2020-04-17 DIAGNOSIS — F329 Major depressive disorder, single episode, unspecified: Secondary | ICD-10-CM | POA: Diagnosis not present

## 2020-04-17 DIAGNOSIS — K30 Functional dyspepsia: Secondary | ICD-10-CM | POA: Diagnosis not present

## 2020-04-17 DIAGNOSIS — D701 Agranulocytosis secondary to cancer chemotherapy: Secondary | ICD-10-CM | POA: Diagnosis not present

## 2020-04-17 DIAGNOSIS — Z85048 Personal history of other malignant neoplasm of rectum, rectosigmoid junction, and anus: Secondary | ICD-10-CM | POA: Diagnosis not present

## 2020-04-17 DIAGNOSIS — K591 Functional diarrhea: Secondary | ICD-10-CM | POA: Diagnosis not present

## 2020-04-17 DIAGNOSIS — R110 Nausea: Secondary | ICD-10-CM | POA: Diagnosis not present

## 2020-04-17 DIAGNOSIS — N1831 Chronic kidney disease, stage 3a: Secondary | ICD-10-CM | POA: Diagnosis not present

## 2020-04-17 DIAGNOSIS — Z780 Asymptomatic menopausal state: Secondary | ICD-10-CM | POA: Diagnosis not present

## 2020-04-17 DIAGNOSIS — C9190 Lymphoid leukemia, unspecified not having achieved remission: Secondary | ICD-10-CM | POA: Diagnosis not present

## 2020-04-17 DIAGNOSIS — C7951 Secondary malignant neoplasm of bone: Secondary | ICD-10-CM | POA: Diagnosis not present

## 2020-04-17 DIAGNOSIS — D6959 Other secondary thrombocytopenia: Secondary | ICD-10-CM | POA: Diagnosis not present

## 2020-04-30 DIAGNOSIS — C9190 Lymphoid leukemia, unspecified not having achieved remission: Secondary | ICD-10-CM | POA: Diagnosis not present

## 2020-04-30 DIAGNOSIS — Z85048 Personal history of other malignant neoplasm of rectum, rectosigmoid junction, and anus: Secondary | ICD-10-CM | POA: Diagnosis not present

## 2020-04-30 DIAGNOSIS — Z5112 Encounter for antineoplastic immunotherapy: Secondary | ICD-10-CM | POA: Diagnosis not present

## 2020-04-30 DIAGNOSIS — C50912 Malignant neoplasm of unspecified site of left female breast: Secondary | ICD-10-CM | POA: Diagnosis not present

## 2020-04-30 DIAGNOSIS — R630 Anorexia: Secondary | ICD-10-CM | POA: Diagnosis not present

## 2020-04-30 DIAGNOSIS — C7951 Secondary malignant neoplasm of bone: Secondary | ICD-10-CM | POA: Diagnosis not present

## 2020-04-30 DIAGNOSIS — R110 Nausea: Secondary | ICD-10-CM | POA: Diagnosis not present

## 2020-04-30 DIAGNOSIS — D701 Agranulocytosis secondary to cancer chemotherapy: Secondary | ICD-10-CM | POA: Diagnosis not present

## 2020-05-03 DIAGNOSIS — C50912 Malignant neoplasm of unspecified site of left female breast: Secondary | ICD-10-CM | POA: Diagnosis not present

## 2020-05-03 DIAGNOSIS — C9190 Lymphoid leukemia, unspecified not having achieved remission: Secondary | ICD-10-CM | POA: Diagnosis not present

## 2020-05-03 DIAGNOSIS — C7951 Secondary malignant neoplasm of bone: Secondary | ICD-10-CM | POA: Diagnosis not present

## 2020-05-03 DIAGNOSIS — D701 Agranulocytosis secondary to cancer chemotherapy: Secondary | ICD-10-CM | POA: Diagnosis not present

## 2020-05-03 DIAGNOSIS — Z85048 Personal history of other malignant neoplasm of rectum, rectosigmoid junction, and anus: Secondary | ICD-10-CM | POA: Diagnosis not present

## 2020-05-03 DIAGNOSIS — Z5112 Encounter for antineoplastic immunotherapy: Secondary | ICD-10-CM | POA: Diagnosis not present

## 2020-05-07 DIAGNOSIS — Z23 Encounter for immunization: Secondary | ICD-10-CM | POA: Diagnosis not present

## 2020-05-28 DIAGNOSIS — R110 Nausea: Secondary | ICD-10-CM | POA: Diagnosis not present

## 2020-05-28 DIAGNOSIS — Z853 Personal history of malignant neoplasm of breast: Secondary | ICD-10-CM | POA: Diagnosis not present

## 2020-05-28 DIAGNOSIS — R634 Abnormal weight loss: Secondary | ICD-10-CM | POA: Diagnosis not present

## 2020-05-28 DIAGNOSIS — R1013 Epigastric pain: Secondary | ICD-10-CM | POA: Diagnosis not present

## 2020-05-28 DIAGNOSIS — K219 Gastro-esophageal reflux disease without esophagitis: Secondary | ICD-10-CM | POA: Diagnosis not present

## 2020-05-28 DIAGNOSIS — Z85040 Personal history of malignant carcinoid tumor of rectum: Secondary | ICD-10-CM | POA: Diagnosis not present

## 2020-05-29 DIAGNOSIS — Z170 Estrogen receptor positive status [ER+]: Secondary | ICD-10-CM | POA: Diagnosis not present

## 2020-05-29 DIAGNOSIS — Z5111 Encounter for antineoplastic chemotherapy: Secondary | ICD-10-CM | POA: Diagnosis not present

## 2020-05-29 DIAGNOSIS — Z1509 Genetic susceptibility to other malignant neoplasm: Secondary | ICD-10-CM | POA: Diagnosis not present

## 2020-05-29 DIAGNOSIS — D6959 Other secondary thrombocytopenia: Secondary | ICD-10-CM | POA: Diagnosis not present

## 2020-05-29 DIAGNOSIS — Z79899 Other long term (current) drug therapy: Secondary | ICD-10-CM | POA: Diagnosis not present

## 2020-05-29 DIAGNOSIS — D7589 Other specified diseases of blood and blood-forming organs: Secondary | ICD-10-CM | POA: Diagnosis not present

## 2020-05-29 DIAGNOSIS — K30 Functional dyspepsia: Secondary | ICD-10-CM | POA: Diagnosis not present

## 2020-05-29 DIAGNOSIS — Z85048 Personal history of other malignant neoplasm of rectum, rectosigmoid junction, and anus: Secondary | ICD-10-CM | POA: Diagnosis not present

## 2020-05-29 DIAGNOSIS — I10 Essential (primary) hypertension: Secondary | ICD-10-CM | POA: Diagnosis not present

## 2020-05-29 DIAGNOSIS — T451X5A Adverse effect of antineoplastic and immunosuppressive drugs, initial encounter: Secondary | ICD-10-CM | POA: Diagnosis not present

## 2020-05-29 DIAGNOSIS — C50912 Malignant neoplasm of unspecified site of left female breast: Secondary | ICD-10-CM | POA: Diagnosis not present

## 2020-05-29 DIAGNOSIS — I129 Hypertensive chronic kidney disease with stage 1 through stage 4 chronic kidney disease, or unspecified chronic kidney disease: Secondary | ICD-10-CM | POA: Diagnosis not present

## 2020-05-29 DIAGNOSIS — N1831 Chronic kidney disease, stage 3a: Secondary | ICD-10-CM | POA: Diagnosis not present

## 2020-05-29 DIAGNOSIS — I427 Cardiomyopathy due to drug and external agent: Secondary | ICD-10-CM | POA: Diagnosis not present

## 2020-05-29 DIAGNOSIS — Z1501 Genetic susceptibility to malignant neoplasm of breast: Secondary | ICD-10-CM | POA: Diagnosis not present

## 2020-05-29 DIAGNOSIS — R0602 Shortness of breath: Secondary | ICD-10-CM | POA: Diagnosis not present

## 2020-05-29 DIAGNOSIS — Z5112 Encounter for antineoplastic immunotherapy: Secondary | ICD-10-CM | POA: Diagnosis not present

## 2020-05-29 DIAGNOSIS — Z95828 Presence of other vascular implants and grafts: Secondary | ICD-10-CM | POA: Diagnosis not present

## 2020-05-29 DIAGNOSIS — F329 Major depressive disorder, single episode, unspecified: Secondary | ICD-10-CM | POA: Diagnosis not present

## 2020-05-29 DIAGNOSIS — C50011 Malignant neoplasm of nipple and areola, right female breast: Secondary | ICD-10-CM | POA: Diagnosis not present

## 2020-05-29 DIAGNOSIS — K591 Functional diarrhea: Secondary | ICD-10-CM | POA: Diagnosis not present

## 2020-05-29 DIAGNOSIS — Z1502 Genetic susceptibility to malignant neoplasm of ovary: Secondary | ICD-10-CM | POA: Diagnosis not present

## 2020-05-29 DIAGNOSIS — C7951 Secondary malignant neoplasm of bone: Secondary | ICD-10-CM | POA: Diagnosis not present

## 2020-05-29 DIAGNOSIS — R110 Nausea: Secondary | ICD-10-CM | POA: Diagnosis not present

## 2020-05-29 DIAGNOSIS — F419 Anxiety disorder, unspecified: Secondary | ICD-10-CM | POA: Diagnosis not present

## 2020-05-29 DIAGNOSIS — R5382 Chronic fatigue, unspecified: Secondary | ICD-10-CM | POA: Diagnosis not present

## 2020-05-30 DIAGNOSIS — C50912 Malignant neoplasm of unspecified site of left female breast: Secondary | ICD-10-CM | POA: Diagnosis not present

## 2020-05-30 DIAGNOSIS — Z5112 Encounter for antineoplastic immunotherapy: Secondary | ICD-10-CM | POA: Diagnosis not present

## 2020-05-30 DIAGNOSIS — Z170 Estrogen receptor positive status [ER+]: Secondary | ICD-10-CM | POA: Diagnosis not present

## 2020-05-30 DIAGNOSIS — C50011 Malignant neoplasm of nipple and areola, right female breast: Secondary | ICD-10-CM | POA: Diagnosis not present

## 2020-05-30 DIAGNOSIS — N1831 Chronic kidney disease, stage 3a: Secondary | ICD-10-CM | POA: Diagnosis not present

## 2020-05-30 DIAGNOSIS — C7951 Secondary malignant neoplasm of bone: Secondary | ICD-10-CM | POA: Diagnosis not present

## 2020-05-31 DIAGNOSIS — N1831 Chronic kidney disease, stage 3a: Secondary | ICD-10-CM | POA: Diagnosis not present

## 2020-05-31 DIAGNOSIS — C50912 Malignant neoplasm of unspecified site of left female breast: Secondary | ICD-10-CM | POA: Diagnosis not present

## 2020-05-31 DIAGNOSIS — N183 Chronic kidney disease, stage 3 unspecified: Secondary | ICD-10-CM | POA: Diagnosis not present

## 2020-05-31 DIAGNOSIS — C50919 Malignant neoplasm of unspecified site of unspecified female breast: Secondary | ICD-10-CM | POA: Diagnosis not present

## 2020-06-01 DIAGNOSIS — Z452 Encounter for adjustment and management of vascular access device: Secondary | ICD-10-CM | POA: Diagnosis not present

## 2020-06-01 DIAGNOSIS — Z95828 Presence of other vascular implants and grafts: Secondary | ICD-10-CM | POA: Diagnosis not present

## 2020-06-01 DIAGNOSIS — Z170 Estrogen receptor positive status [ER+]: Secondary | ICD-10-CM | POA: Diagnosis not present

## 2020-06-01 DIAGNOSIS — C50012 Malignant neoplasm of nipple and areola, left female breast: Secondary | ICD-10-CM | POA: Diagnosis not present

## 2020-06-02 DIAGNOSIS — Z170 Estrogen receptor positive status [ER+]: Secondary | ICD-10-CM | POA: Diagnosis not present

## 2020-06-02 DIAGNOSIS — C50012 Malignant neoplasm of nipple and areola, left female breast: Secondary | ICD-10-CM | POA: Diagnosis not present

## 2020-06-02 DIAGNOSIS — Z95828 Presence of other vascular implants and grafts: Secondary | ICD-10-CM | POA: Diagnosis not present

## 2020-06-03 ENCOUNTER — Ambulatory Visit (HOSPITAL_COMMUNITY): Payer: Medicare Other

## 2020-06-03 ENCOUNTER — Encounter (HOSPITAL_COMMUNITY): Payer: Self-pay

## 2020-06-03 ENCOUNTER — Inpatient Hospital Stay (HOSPITAL_COMMUNITY)
Admission: RE | Admit: 2020-06-03 | Discharge: 2020-06-03 | Disposition: A | Discharge status: Home / Self Care | Payer: Medicare Other | Source: Ambulatory Visit | Attending: Urology | Admitting: Urology

## 2020-06-03 ENCOUNTER — Encounter (HOSPITAL_COMMUNITY)
Admission: EM | Disposition: A | Discharge status: Home / Self Care | Payer: Self-pay | Source: Home / Self Care | Attending: Internal Medicine

## 2020-06-03 ENCOUNTER — Other Ambulatory Visit: Payer: Self-pay | Admitting: Urology

## 2020-06-03 ENCOUNTER — Telehealth: Payer: Self-pay

## 2020-06-03 ENCOUNTER — Emergency Department (HOSPITAL_COMMUNITY): Payer: Medicare Other | Admitting: Certified Registered Nurse Anesthetist

## 2020-06-03 ENCOUNTER — Other Ambulatory Visit: Payer: Self-pay

## 2020-06-03 ENCOUNTER — Inpatient Hospital Stay (HOSPITAL_COMMUNITY)
Admission: EM | Admit: 2020-06-03 | Discharge: 2020-06-08 | DRG: 660 | Disposition: A | Discharge status: Home / Self Care | Payer: Medicare Other | Attending: Internal Medicine | Admitting: Internal Medicine

## 2020-06-03 DIAGNOSIS — D638 Anemia in other chronic diseases classified elsewhere: Secondary | ICD-10-CM | POA: Diagnosis present

## 2020-06-03 DIAGNOSIS — N139 Obstructive and reflux uropathy, unspecified: Secondary | ICD-10-CM | POA: Diagnosis present

## 2020-06-03 DIAGNOSIS — N179 Acute kidney failure, unspecified: Secondary | ICD-10-CM | POA: Diagnosis present

## 2020-06-03 DIAGNOSIS — I429 Cardiomyopathy, unspecified: Secondary | ICD-10-CM | POA: Diagnosis present

## 2020-06-03 DIAGNOSIS — N1330 Unspecified hydronephrosis: Secondary | ICD-10-CM | POA: Diagnosis not present

## 2020-06-03 DIAGNOSIS — I129 Hypertensive chronic kidney disease with stage 1 through stage 4 chronic kidney disease, or unspecified chronic kidney disease: Secondary | ICD-10-CM | POA: Diagnosis not present

## 2020-06-03 DIAGNOSIS — Z79899 Other long term (current) drug therapy: Secondary | ICD-10-CM

## 2020-06-03 DIAGNOSIS — C50912 Malignant neoplasm of unspecified site of left female breast: Secondary | ICD-10-CM | POA: Diagnosis not present

## 2020-06-03 DIAGNOSIS — N3289 Other specified disorders of bladder: Secondary | ICD-10-CM | POA: Diagnosis not present

## 2020-06-03 DIAGNOSIS — F32A Depression, unspecified: Secondary | ICD-10-CM | POA: Diagnosis not present

## 2020-06-03 DIAGNOSIS — Z933 Colostomy status: Secondary | ICD-10-CM

## 2020-06-03 DIAGNOSIS — Z66 Do not resuscitate: Secondary | ICD-10-CM | POA: Diagnosis present

## 2020-06-03 DIAGNOSIS — R7989 Other specified abnormal findings of blood chemistry: Secondary | ICD-10-CM | POA: Diagnosis not present

## 2020-06-03 DIAGNOSIS — R110 Nausea: Secondary | ICD-10-CM | POA: Diagnosis not present

## 2020-06-03 DIAGNOSIS — I700 Atherosclerosis of aorta: Secondary | ICD-10-CM | POA: Diagnosis not present

## 2020-06-03 DIAGNOSIS — K219 Gastro-esophageal reflux disease without esophagitis: Secondary | ICD-10-CM | POA: Diagnosis present

## 2020-06-03 DIAGNOSIS — D539 Nutritional anemia, unspecified: Secondary | ICD-10-CM | POA: Diagnosis present

## 2020-06-03 DIAGNOSIS — Z87440 Personal history of urinary (tract) infections: Secondary | ICD-10-CM

## 2020-06-03 DIAGNOSIS — Z888 Allergy status to other drugs, medicaments and biological substances status: Secondary | ICD-10-CM

## 2020-06-03 DIAGNOSIS — N1831 Chronic kidney disease, stage 3a: Secondary | ICD-10-CM | POA: Diagnosis present

## 2020-06-03 DIAGNOSIS — Z85048 Personal history of other malignant neoplasm of rectum, rectosigmoid junction, and anus: Secondary | ICD-10-CM

## 2020-06-03 DIAGNOSIS — C210 Malignant neoplasm of anus, unspecified: Secondary | ICD-10-CM | POA: Diagnosis present

## 2020-06-03 DIAGNOSIS — C50919 Malignant neoplasm of unspecified site of unspecified female breast: Secondary | ICD-10-CM | POA: Diagnosis present

## 2020-06-03 DIAGNOSIS — C7951 Secondary malignant neoplasm of bone: Secondary | ICD-10-CM | POA: Diagnosis present

## 2020-06-03 DIAGNOSIS — M810 Age-related osteoporosis without current pathological fracture: Secondary | ICD-10-CM | POA: Diagnosis present

## 2020-06-03 DIAGNOSIS — R52 Pain, unspecified: Secondary | ICD-10-CM

## 2020-06-03 DIAGNOSIS — F419 Anxiety disorder, unspecified: Secondary | ICD-10-CM | POA: Diagnosis present

## 2020-06-03 DIAGNOSIS — Z20822 Contact with and (suspected) exposure to covid-19: Secondary | ICD-10-CM | POA: Diagnosis present

## 2020-06-03 DIAGNOSIS — Z79811 Long term (current) use of aromatase inhibitors: Secondary | ICD-10-CM

## 2020-06-03 DIAGNOSIS — Z9012 Acquired absence of left breast and nipple: Secondary | ICD-10-CM

## 2020-06-03 DIAGNOSIS — Z23 Encounter for immunization: Secondary | ICD-10-CM | POA: Diagnosis not present

## 2020-06-03 DIAGNOSIS — Z15068 Genetic susceptibility to other malignant neoplasm of digestive system: Secondary | ICD-10-CM | POA: Diagnosis present

## 2020-06-03 DIAGNOSIS — Z8249 Family history of ischemic heart disease and other diseases of the circulatory system: Secondary | ICD-10-CM

## 2020-06-03 DIAGNOSIS — I10 Essential (primary) hypertension: Secondary | ICD-10-CM | POA: Diagnosis not present

## 2020-06-03 DIAGNOSIS — Z1509 Genetic susceptibility to other malignant neoplasm: Secondary | ICD-10-CM | POA: Diagnosis present

## 2020-06-03 DIAGNOSIS — Z1501 Genetic susceptibility to malignant neoplasm of breast: Secondary | ICD-10-CM | POA: Diagnosis not present

## 2020-06-03 DIAGNOSIS — N131 Hydronephrosis with ureteral stricture, not elsewhere classified: Secondary | ICD-10-CM | POA: Diagnosis not present

## 2020-06-03 DIAGNOSIS — Z9221 Personal history of antineoplastic chemotherapy: Secondary | ICD-10-CM

## 2020-06-03 HISTORY — PX: CYSTOSCOPY W/ URETERAL STENT PLACEMENT: SHX1429

## 2020-06-03 LAB — CBC WITH DIFFERENTIAL/PLATELET
Abs Immature Granulocytes: 0.01 10*3/uL (ref 0.00–0.07)
Basophils Absolute: 0 10*3/uL (ref 0.0–0.1)
Basophils Relative: 1 %
Eosinophils Absolute: 0 10*3/uL (ref 0.0–0.5)
Eosinophils Relative: 1 %
HCT: 28.9 % — ABNORMAL LOW (ref 36.0–46.0)
Hemoglobin: 9.4 g/dL — ABNORMAL LOW (ref 12.0–15.0)
Immature Granulocytes: 0 %
Lymphocytes Relative: 19 %
Lymphs Abs: 1.3 10*3/uL (ref 0.7–4.0)
MCH: 34.6 pg — ABNORMAL HIGH (ref 26.0–34.0)
MCHC: 32.5 g/dL (ref 30.0–36.0)
MCV: 106.3 fL — ABNORMAL HIGH (ref 80.0–100.0)
Monocytes Absolute: 0.7 10*3/uL (ref 0.1–1.0)
Monocytes Relative: 10 %
Neutro Abs: 4.6 10*3/uL (ref 1.7–7.7)
Neutrophils Relative %: 69 %
Platelets: 232 10*3/uL (ref 150–400)
RBC: 2.72 MIL/uL — ABNORMAL LOW (ref 3.87–5.11)
RDW: 12.1 % (ref 11.5–15.5)
WBC: 6.5 10*3/uL (ref 4.0–10.5)
nRBC: 0 % (ref 0.0–0.2)

## 2020-06-03 LAB — COMPREHENSIVE METABOLIC PANEL
ALT: 31 U/L (ref 0–44)
AST: 32 U/L (ref 15–41)
Albumin: 3.8 g/dL (ref 3.5–5.0)
Alkaline Phosphatase: 55 U/L (ref 38–126)
Anion gap: 13 (ref 5–15)
BUN: 54 mg/dL — ABNORMAL HIGH (ref 8–23)
CO2: 19 mmol/L — ABNORMAL LOW (ref 22–32)
Calcium: 8.1 mg/dL — ABNORMAL LOW (ref 8.9–10.3)
Chloride: 109 mmol/L (ref 98–111)
Creatinine, Ser: 5.03 mg/dL — ABNORMAL HIGH (ref 0.44–1.00)
GFR, Estimated: 9 mL/min — ABNORMAL LOW (ref >60)
Glucose, Bld: 81 mg/dL (ref 70–99)
Potassium: 4.8 mmol/L (ref 3.5–5.1)
Sodium: 141 mmol/L (ref 135–145)
Total Bilirubin: 0.7 mg/dL (ref 0.3–1.2)
Total Protein: 6.8 g/dL (ref 6.5–8.1)

## 2020-06-03 LAB — URINALYSIS, ROUTINE W REFLEX MICROSCOPIC
Bilirubin Urine: NEGATIVE
Glucose, UA: NEGATIVE mg/dL
Hgb urine dipstick: NEGATIVE
Ketones, ur: NEGATIVE mg/dL
Leukocytes,Ua: NEGATIVE
Nitrite: NEGATIVE
Protein, ur: NEGATIVE mg/dL
Specific Gravity, Urine: 1.008 (ref 1.005–1.030)
pH: 6 (ref 5.0–8.0)

## 2020-06-03 LAB — RESPIRATORY PANEL BY RT PCR (FLU A&B, COVID)
Influenza A by PCR: NEGATIVE
Influenza B by PCR: NEGATIVE
SARS Coronavirus 2 by RT PCR: NEGATIVE

## 2020-06-03 SURGERY — CYSTOSCOPY, WITH RETROGRADE PYELOGRAM AND URETERAL STENT INSERTION
Anesthesia: General | Site: Ureter | Laterality: Bilateral

## 2020-06-03 MED ORDER — ACETAMINOPHEN 650 MG RE SUPP: 650.0000 mg | Freq: Four times a day (QID) | RECTAL | Status: DC | PRN

## 2020-06-03 MED ORDER — HYDRALAZINE HCL 20 MG/ML IJ SOLN
INTRAMUSCULAR | Status: AC
  Filled 2020-06-03: qty 1

## 2020-06-03 MED ORDER — SODIUM CHLORIDE 0.9 % IR SOLN
Status: DC | PRN
  Administered 2020-06-03: 1000 mL

## 2020-06-03 MED ORDER — LIDOCAINE 2% (20 MG/ML) 5 ML SYRINGE
INTRAMUSCULAR | Status: AC
  Filled 2020-06-03: qty 5

## 2020-06-03 MED ORDER — CHLORHEXIDINE GLUCONATE 0.12 % MT SOLN
15.0000 mL | OROMUCOSAL | Status: AC
  Administered 2020-06-03: 15 mL via OROMUCOSAL

## 2020-06-03 MED ORDER — HEPARIN SODIUM (PORCINE) 5000 UNIT/ML IJ SOLN
5000.0000 [IU] | Freq: Three times a day (TID) | INTRAMUSCULAR | Status: DC
  Administered 2020-06-04 – 2020-06-08 (×12): 5000 [IU] via SUBCUTANEOUS
  Filled 2020-06-03 (×12): qty 1

## 2020-06-03 MED ORDER — ONDANSETRON HCL 4 MG/2ML IJ SOLN: 4.0000 mg | Freq: Once | INTRAMUSCULAR | Status: DC | PRN

## 2020-06-03 MED ORDER — ONDANSETRON HCL 4 MG/2ML IJ SOLN: 4.0000 mg | Freq: Four times a day (QID) | INTRAMUSCULAR | Status: DC | PRN

## 2020-06-03 MED ORDER — FENTANYL CITRATE (PF) 100 MCG/2ML IJ SOLN
25.0000 ug | INTRAMUSCULAR | Status: DC | PRN
  Administered 2020-06-03: 50 ug via INTRAVENOUS

## 2020-06-03 MED ORDER — BUPROPION HCL ER (SR) 100 MG PO TB12
100.0000 mg | ORAL_TABLET | Freq: Two times a day (BID) | ORAL | Status: DC
  Filled 2020-06-03: qty 1

## 2020-06-03 MED ORDER — ACETAMINOPHEN 325 MG PO TABS
650.0000 mg | ORAL_TABLET | Freq: Four times a day (QID) | ORAL | Status: DC | PRN
  Administered 2020-06-04 – 2020-06-06 (×6): 650 mg via ORAL
  Filled 2020-06-03 (×6): qty 2

## 2020-06-03 MED ORDER — CLONAZEPAM 0.5 MG PO TABS: 0.5000 mg | ORAL_TABLET | Freq: Two times a day (BID) | ORAL | Status: DC | PRN

## 2020-06-03 MED ORDER — METOPROLOL SUCCINATE ER 25 MG PO TB24: 25.0000 mg | ORAL_TABLET | Freq: Two times a day (BID) | ORAL | Status: DC

## 2020-06-03 MED ORDER — TRAMADOL HCL 50 MG PO TABS
50.0000 mg | ORAL_TABLET | Freq: Two times a day (BID) | ORAL | Status: DC | PRN
  Administered 2020-06-04 – 2020-06-05 (×2): 50 mg via ORAL
  Filled 2020-06-03 (×2): qty 1

## 2020-06-03 MED ORDER — PANTOPRAZOLE SODIUM 40 MG PO TBEC: 80.0000 mg | DELAYED_RELEASE_TABLET | Freq: Two times a day (BID) | ORAL | Status: DC

## 2020-06-03 MED ORDER — BUPROPION HCL ER (SR) 100 MG PO TB12
100.0000 mg | ORAL_TABLET | Freq: Two times a day (BID) | ORAL | Status: DC
  Administered 2020-06-03 – 2020-06-04 (×3): 100 mg via ORAL
  Filled 2020-06-03 (×4): qty 1

## 2020-06-03 MED ORDER — SODIUM CHLORIDE 0.9 % IR SOLN
Status: DC | PRN
  Administered 2020-06-03: 3000 mL

## 2020-06-03 MED ORDER — ONDANSETRON HCL 4 MG/2ML IJ SOLN
INTRAMUSCULAR | Status: DC | PRN
  Administered 2020-06-03: 4 mg via INTRAVENOUS

## 2020-06-03 MED ORDER — CEFAZOLIN SODIUM-DEXTROSE 2-4 GM/100ML-% IV SOLN
INTRAVENOUS | Status: AC
  Filled 2020-06-03: qty 100

## 2020-06-03 MED ORDER — LETROZOLE 2.5 MG PO TABS: 2.5000 mg | ORAL_TABLET | Freq: Every day | ORAL | Status: DC

## 2020-06-03 MED ORDER — PANTOPRAZOLE SODIUM 40 MG IV SOLR
40.0000 mg | INTRAVENOUS | Status: DC
  Administered 2020-06-03 – 2020-06-04 (×2): 40 mg via INTRAVENOUS
  Filled 2020-06-03 (×2): qty 40

## 2020-06-03 MED ORDER — LIDOCAINE HCL (CARDIAC) PF 100 MG/5ML IV SOSY
PREFILLED_SYRINGE | INTRAVENOUS | Status: DC | PRN
  Administered 2020-06-03: 60 mg via INTRAVENOUS

## 2020-06-03 MED ORDER — FENTANYL CITRATE (PF) 100 MCG/2ML IJ SOLN
INTRAMUSCULAR | Status: AC
  Filled 2020-06-03: qty 2

## 2020-06-03 MED ORDER — METOPROLOL SUCCINATE ER 25 MG PO TB24
25.0000 mg | ORAL_TABLET | Freq: Two times a day (BID) | ORAL | Status: DC
  Administered 2020-06-03 – 2020-06-08 (×10): 25 mg via ORAL
  Filled 2020-06-03 (×10): qty 1

## 2020-06-03 MED ORDER — INFLUENZA VAC A&B SA ADJ QUAD 0.5 ML IM PRSY
0.5000 mL | PREFILLED_SYRINGE | INTRAMUSCULAR | Status: AC
  Administered 2020-06-04: 0.5 mL via INTRAMUSCULAR
  Filled 2020-06-03: qty 0.5

## 2020-06-03 MED ORDER — ZOLPIDEM TARTRATE 5 MG PO TABS
5.0000 mg | ORAL_TABLET | Freq: Every evening | ORAL | Status: DC | PRN
  Administered 2020-06-03 – 2020-06-04 (×2): 5 mg via ORAL
  Filled 2020-06-03 (×2): qty 1

## 2020-06-03 MED ORDER — DEXAMETHASONE SODIUM PHOSPHATE 10 MG/ML IJ SOLN
INTRAMUSCULAR | Status: DC | PRN
  Administered 2020-06-03: 10 mg via INTRAVENOUS

## 2020-06-03 MED ORDER — IOHEXOL 300 MG/ML  SOLN
INTRAMUSCULAR | Status: DC | PRN
  Administered 2020-06-03: 25 mL

## 2020-06-03 MED ORDER — ONDANSETRON HCL 4 MG PO TABS
4.0000 mg | ORAL_TABLET | Freq: Four times a day (QID) | ORAL | Status: DC | PRN
  Administered 2020-06-06: 4 mg via ORAL
  Filled 2020-06-03: qty 1

## 2020-06-03 MED ORDER — BELLADONNA ALKALOIDS-OPIUM 16.2-30 MG RE SUPP
RECTAL | Status: AC
  Filled 2020-06-03: qty 1

## 2020-06-03 MED ORDER — LACTATED RINGERS IV SOLN: INTRAVENOUS | Status: DC | PRN

## 2020-06-03 MED ORDER — LETROZOLE 2.5 MG PO TABS
2.5000 mg | ORAL_TABLET | Freq: Every day | ORAL | Status: DC
  Administered 2020-06-04 – 2020-06-08 (×5): 2.5 mg via ORAL
  Filled 2020-06-03 (×5): qty 1

## 2020-06-03 MED ORDER — FENTANYL CITRATE (PF) 100 MCG/2ML IJ SOLN
INTRAMUSCULAR | Status: DC | PRN
  Administered 2020-06-03 (×2): 50 ug via INTRAVENOUS

## 2020-06-03 MED ORDER — HYDRALAZINE HCL 20 MG/ML IJ SOLN
5.0000 mg | INTRAMUSCULAR | Status: DC | PRN
  Administered 2020-06-03: 5 mg via INTRAVENOUS

## 2020-06-03 MED ORDER — PROPOFOL 10 MG/ML IV BOLUS
INTRAVENOUS | Status: DC | PRN
  Administered 2020-06-03: 200 mg via INTRAVENOUS

## 2020-06-03 MED ORDER — LACTATED RINGERS IV SOLN: Freq: Once | INTRAVENOUS | Status: DC

## 2020-06-03 MED ORDER — CEFAZOLIN SODIUM-DEXTROSE 2-3 GM-%(50ML) IV SOLR
INTRAVENOUS | Status: DC | PRN
  Administered 2020-06-03: 2 g via INTRAVENOUS

## 2020-06-03 MED ORDER — ONDANSETRON HCL 4 MG/2ML IJ SOLN
4.0000 mg | Freq: Four times a day (QID) | INTRAMUSCULAR | Status: DC | PRN
  Administered 2020-06-05 – 2020-06-07 (×4): 4 mg via INTRAVENOUS
  Filled 2020-06-03 (×4): qty 2

## 2020-06-03 MED ORDER — PROPOFOL 10 MG/ML IV BOLUS
INTRAVENOUS | Status: AC
  Filled 2020-06-03: qty 40

## 2020-06-03 MED ORDER — ACETAMINOPHEN 10 MG/ML IV SOLN: 1000.0000 mg | Freq: Once | INTRAVENOUS | Status: DC | PRN

## 2020-06-03 SURGICAL SUPPLY — 22 items
BAG DRAINAGE 600ML DEPOT (BAG) ×1 IMPLANT
BAG DRN 24 TWST VLV ADJ (BAG) ×1
BAG URO CATCHER STRL LF (MISCELLANEOUS) ×2 IMPLANT
BASKET ZERO TIP NITINOL 2.4FR (BASKET) IMPLANT
BSKT STON RTRVL ZERO TP 2.4FR (BASKET)
CATH FOLEY 2WAY SLVR  5CC 14FR (CATHETERS) ×2
CATH FOLEY 2WAY SLVR 5CC 14FR (CATHETERS) IMPLANT
CATH URET 5FR 28IN OPEN ENDED (CATHETERS) ×2 IMPLANT
CLOTH BEACON ORANGE TIMEOUT ST (SAFETY) ×2 IMPLANT
EXTRACTOR STONE 1.7FRX115CM (UROLOGICAL SUPPLIES) IMPLANT
GLOVE BIOGEL M STRL SZ7.5 (GLOVE) ×2 IMPLANT
GOWN STRL REUS W/TWL XL LVL3 (GOWN DISPOSABLE) ×2 IMPLANT
GUIDEWIRE ANG ZIPWIRE 038X150 (WIRE) IMPLANT
GUIDEWIRE STR DUAL SENSOR (WIRE) ×2 IMPLANT
KIT TURNOVER KIT A (KITS) IMPLANT
MANIFOLD NEPTUNE II (INSTRUMENTS) ×2 IMPLANT
PACK CYSTO (CUSTOM PROCEDURE TRAY) ×2 IMPLANT
SHEATH URETERAL 12FRX28CM (UROLOGICAL SUPPLIES) IMPLANT
SHEATH URETERAL 12FRX35CM (MISCELLANEOUS) IMPLANT
STENT POLARIS 5FRX24 (STENTS) ×1 IMPLANT
TUBING CONNECTING 10 (TUBING) ×2 IMPLANT
TUBING UROLOGY SET (TUBING) ×2 IMPLANT

## 2020-06-03 NOTE — ED Triage Notes (Addendum)
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

## 2020-06-03 NOTE — Anesthesia Preprocedure Evaluation (Addendum)
Anesthesia Evaluation  Patient identified by MRN, date of birth, ID band Patient awake    Reviewed: Allergy & Precautions, NPO status , Patient's Chart, lab work & pertinent test results  Airway Mallampati: I  TM Distance: >3 FB Neck ROM: Full    Dental no notable dental hx.    Pulmonary neg pulmonary ROS,    Pulmonary exam normal breath sounds clear to auscultation       Cardiovascular hypertension, Pt. on home beta blockers Normal cardiovascular exam Rhythm:Regular Rate:Normal     Neuro/Psych PSYCHIATRIC DISORDERS Depression negative neurological ROS     GI/Hepatic GERD  Medicated and Controlled,(+)     substance abuse  ,   Endo/Other  Breast cancer   Renal/GU CRF and ARFRenal disease     Musculoskeletal negative musculoskeletal ROS (+)   Abdominal   Peds  Hematology  (+) anemia ,   Anesthesia Other Findings BILATERAL STONES  Reproductive/Obstetrics                            Anesthesia Physical Anesthesia Plan  ASA: III and emergent  Anesthesia Plan: General   Post-op Pain Management:    Induction: Intravenous  PONV Risk Score and Plan: 4 or greater and Ondansetron, Dexamethasone and Treatment may vary due to age or medical condition  Airway Management Planned: LMA  Additional Equipment:   Intra-op Plan:   Post-operative Plan: Extubation in OR  Informed Consent: I have reviewed the patients History and Physical, chart, labs and discussed the procedure including the risks, benefits and alternatives for the proposed anesthesia with the patient or authorized representative who has indicated his/her understanding and acceptance.     Dental advisory given  Plan Discussed with: CRNA  Anesthesia Plan Comments:        Anesthesia Quick Evaluation

## 2020-06-03 NOTE — Transfer of Care (Signed)
Immediate Anesthesia Transfer of Care Note  Patient: [name]  Procedure(s) Performed: CYSTOSCOPY WITH RETROGRADE PYELOGRAM/URETERAL STENT PLACEMENT bladder biopsy (Bilateral Ureter)  Patient Location: PACU  Anesthesia Type:General  Level of Consciousness: awake and alert   Airway & Oxygen Therapy: Patient Spontanous Breathing and Patient connected to face mask oxygen  Post-op Assessment: Report given to RN and Post -op Vital signs reviewed and stable  Post vital signs: Reviewed and stable  Last Vitals:  Vitals Value Taken Time  BP    Temp    Pulse 74 06/03/20 1918  Resp 14 06/03/20 1918  SpO2 100 % 06/03/20 1918  Vitals shown include unvalidated device data.  Last Pain:  Vitals:   06/03/20 1454  TempSrc:   PainSc: 0-No pain         Complications: No complications documented.

## 2020-06-03 NOTE — H&P (Signed)
I have been asked to see the patient by Dr. Nanda Quinton , for evaluation and management of bilateral hydronephrosis and acute renal failure.  History of present illness: 69 year old female who presented to the emergency department after having had a CT scan as an outpatient that demonstrated bilateral hydronephrosis.  The patient has a history of advanced breast cancer, and has been treated with chemotherapy.  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  Over the last 6 weeks or so the patient has been feeling sluggish, nauseous, and unwell.  She has not really had any pain to speak of.  She has not had any real significant urinary tract symptoms.  She denies any hematuria or dysuria.  She does have a history of recurrent urinary tract infections.  In the ER the patient was noted to have a creatinine of 5.0.  Her potassium was 4.8.  Her white blood cell count was 6.5 and her hemoglobin was 9.4.  Her urine analysis was largely unremarkable.  Review of systems: A 12 point comprehensive review of systems was obtained and is negative unless otherwise stated in the history of present illness.  Patient Active Problem List   Diagnosis Date Noted  . Anal cancer ([place]) 09/01/2016  . Coronary artery calcification seen on CT scan 03/22/2016  . 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 Bone metastasis ([place]) 01/17/2016  . 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 BRCA2 positive 12/16/2015  . Port catheter in place 04/14/2013  . Adenocarcinoma of left breast 01/27/2011  . Osteoporosis 01/27/2011    No current facility-administered medications on file prior to encounter.   Current Outpatient Medications on File Prior to Encounter  Medication Sig Dispense Refill  . acetaminophen (TYLENOL) 500 MG tablet Take 1,000 mg by mouth every 6 (six) hours as needed for moderate pain or headache.    Marland Kitchen buPROPion (WELLBUTRIN SR) 100 MG 12 hr tablet Take 100 mg by mouth 2 (two) times daily.     Marland Kitchen CALCIUM-VITAMIN D PO Take 1 tablet by mouth 2 (two) times daily.     . clonazePAM (KLONOPIN) 0.5 MG tablet Take 0.5 mg by mouth 2 (two) times daily as needed for anxiety.    Marland Kitchen denosumab (XGEVA) 120 MG/1.7ML SOLN injection Inject 120 mg into the skin every 3 (three) months.    Marland Kitchen letrozole (FEMARA) 2.5 MG tablet Take 2.5 mg by mouth daily.    . metoprolol succinate (TOPROL-XL) 25 MG 24 hr tablet Take 25 mg by mouth in the morning and at bedtime.   5  . omeprazole (PRILOSEC) 40 MG capsule Take 1 capsule (40 mg total) by mouth 2 (two) times daily. (Patient taking differently: Take 40 mg by mouth in the morning and at bedtime. ) 180 capsule 4  . potassium chloride (KLOR-CON) 10 MEQ tablet Take 20 mEq by mouth 2 (two) times daily.    . vitamin B-12 (CYANOCOBALAMIN) 1000 MCG tablet Take 1,000 mcg by mouth 2 (two) times daily.     Marland Kitchen ALPRAZolam (XANAX) 0.5 MG tablet Take 1 tablet 1 hours prior to MRI and may repeat in the waiting room. (Patient not taking: Reported on 06/03/2020) 5 tablet 0  . mirtazapine (REMERON) 30 MG tablet Take 1 tablet (30 mg total) by mouth at bedtime. Take 15 mg for 3 days then increase to 30 mg (Patient not taking: Reported on 06/03/2020) 60 tablet 2  . nitrofurantoin (MACRODANTIN) 100 MG capsule Take 1 capsule (100 mg total)  by mouth daily. (Patient not taking: Reported on 06/03/2020) 90 capsule 1  . oxyCODONE (OXY IR/ROXICODONE) 5 MG immediate release tablet Take 1 tablet (5 mg total) by mouth every 6 (six) hours as needed for moderate pain, severe pain or breakthrough pain. (Patient not taking: Reported on 06/03/2020) 10 tablet 0    Past Medical History:  Diagnosis Date  . Adenocarcinoma of breast ([place]) 01/27/2011   stage IIIc breast cancer  . Bladder infection   . Bone metastasis ([place]) 01/17/2016  . Borderline hypertension   . Breast cancer ([place])   . Closed fracture of humerus sept 2013  . Depression   . Family history of adverse reaction to anesthesia    father  remembered his surgery  . Fatigue   . GERD (gastroesophageal reflux disease)   . Hypertension   . 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 Osteoporosis 01/27/2011  . Pneumonia   . Port catheter in place 04/14/2013  . Post-mastectomy lymphedema syndrome   . 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 Reflux   . Retinal detachment   . 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 Swollen lymph nodes   . URI (upper respiratory infection) 02/17/12  . Wears glasses     Past Surgical History:  Procedure Laterality Date  . ABDOMINAL PERINEAL BOWEL RESECTION N/A 09/01/2016   Procedure: ABDOMINAL PERINEAL RESECTION PERMANENT COLOSTOMY;  Surgeon: Leighton Ruff, MD;  Location: [facility];  Service: General;  Laterality: N/A;  . biopsy of lymph node     super clavicle  . BTL    . CATARACT EXTRACTION    . cervical disc fusioni    . LYMPH NODE BIOPSY Right 06/19/2015   Procedure: RIGHT NECK LYMPH NODE EXCISION;  Surgeon: Rolm Bookbinder, MD;  Location: [facility];  Service: General;  Laterality: Right;  . LYMPH NODE DISSECTION Left 04/26/2017   Procedure: EXCISION LEFT NECK LYMPH NODE;  Surgeon: Rolm Bookbinder, MD;  Location: [facility];  Service: General;  Laterality: Left;  Marland Kitchen MASS EXCISION Left 07/27/2016   Procedure: EXCISION OF POSTERIOR NECK MASS;  Surgeon: Rolm Bookbinder, MD;  Location: [facility];  Service: General;  Laterality: Left;  Marland Kitchen MASTECTOMY MODIFIED RADICAL     left breast  . Port- a-cath insertion    . retinal detachment and repair      Social History   Tobacco Use  . Smoking status: Never Smoker  . Smokeless tobacco: Never Used  Vaping Use  . Vaping Use: Never used  Substance Use Topics  . Alcohol use: Yes    Alcohol/week: 7.0 standard drinks    Types: 7 Glasses of wine per week    Comment: 1 drink daily  . Drug use: Yes    Types: Marijuana    Comment: During chemo,  none over 1 Year    Family History  Problem Relation Age of Onset  . Hypertension Mother   . Heart disease Mother        atrial fib  . Heart disease Father   . Other Father        glaucoma    PE: Vitals:   06/03/20 1624 06/03/20 1715 06/03/20 1730 06/03/20 1818  BP: (!) 176/95 (!) 174/85 (!) 165/85 (!) 182/86  Pulse: 66 72 65 76  Resp: 18   20  Temp:      TempSrc:      SpO2:  99% 100% 99% 100%  Weight:    59.9 kg  Height:    _0  (1.651 m)   Patient appears to be in no acute distress  patient is alert and oriented x3 Atraumatic normocephalic head No cervical or supraclavicular lymphadenopathy appreciated No increased work of breathing, no audible wheezes/rhonchi Regular sinus rhythm/rate Abdomen is soft, nontender, nondistended, no CVA or suprapubic tenderness Lower extremities are symmetric without appreciable edema Grossly neurologically intact No identifiable skin lesions  Recent Labs    06/03/20 1613  WBC 6.5  HGB 9.4*  HCT 28.9*   Recent Labs    06/03/20 1613  NA 141  K 4.8  CL 109  CO2 19*  GLUCOSE 81  BUN 54*  CREATININE 5.03*  CALCIUM 8.1*   No results for input(s): LABPT, INR in the last 72 hours. No results for input(s): LABURIN in the last 72 hours. Results for orders placed or performed during the hospital encounter of 06/03/20  Respiratory Panel by RT PCR (Flu A&B, Covid) - Nasopharyngeal Swab     Status: None   Collection Time: 06/03/20  4:13 PM   Specimen: Nasopharyngeal Swab  Result Value Ref Range Status   SARS Coronavirus 2 by RT PCR NEGATIVE NEGATIVE Final    Comment: (NOTE) SARS-CoV-2 target nucleic acids are NOT DETECTED.  The SARS-CoV-2 RNA is generally detectable in upper respiratoy specimens during the acute phase of infection. The lowest concentration of SARS-CoV-2 viral copies this assay can detect is 131 copies/mL. A negative result does not preclude SARS-Cov-2 infection and should not be used as the sole basis for  treatment or other patient management decisions. A negative result may occur with  improper specimen collection/handling, submission of specimen other than nasopharyngeal swab, presence of viral mutation(s) within the areas targeted by this assay, and inadequate number of viral copies (<131 copies/mL). A negative result must be combined with clinical observations, patient history, and epidemiological information. The expected result is Negative.  Fact Sheet for Patients:  PinkCheek.be  Fact Sheet for Healthcare Providers:  GravelBags.it  This test is no t yet approved or cleared by the [place] FDA and  has been authorized for detection and/or diagnosis of SARS-CoV-2 by FDA under an Emergency Use Authorization (EUA). This EUA will remain  in effect (meaning this test can be used) for the duration of the COVID-19 declaration under Section 564(b)(1) of the Act, 21 U.S.C. section 360bbb-3(b)(1), unless the authorization is terminated or revoked sooner.     Influenza A by PCR NEGATIVE NEGATIVE Final   Influenza B by PCR NEGATIVE NEGATIVE Final    Comment: (NOTE) The Xpert Xpress SARS-CoV-2/FLU/RSV assay is intended as an aid in  the diagnosis of influenza from Nasopharyngeal swab specimens and  should not be used as a sole basis for treatment. Nasal washings and  aspirates are unacceptable for Xpert Xpress SARS-CoV-2/FLU/RSV  testing.  Fact Sheet for Patients: PinkCheek.be  Fact Sheet for Healthcare Providers: GravelBags.it  This test is not yet approved or cleared by the [place] FDA and  has been authorized for detection and/or diagnosis of SARS-CoV-2 by  FDA under an Emergency Use Authorization (EUA). This EUA will remain  in effect (meaning this test can be used) for the duration of the  Covid-19 declaration under Section 564(b)(1) of the Act, 21   U.S.C. section 360bbb-3(b)(1), unless the authorization is  terminated or revoked. Performed at [facility], [place] [address], [place], [place] [address]     Imaging:  I have independently reviewed the patient's CT scan which demonstrates mild bilateral hydroureteronephrosis and a large capacity bladder which appears to have dropped after her anus was removed.  Imp: Bilateral hydroureteronephrosis and acute renal failure.  Etiology of the hydronephrosis is unclear, may be related to metastatic breast cancer or scar tissue from her previous treatment for her anal cancer.  Recommendations: I went through the management strategies with the patient and recommended that we proceed with ureteral stent placement.  I suspect that if her kidneys start to drain unobstructed her kidney function will improve and she will start to feel remarkably better.  After going through the operation of cystoscopy, bilateral retrograde pyelograms, bilateral stent placement the patient has agreed and opted to proceed.   [name]

## 2020-06-03 NOTE — ED Provider Notes (Signed)
Emergency Department Provider Note   I have reviewed the triage vital signs and the nursing notes.   HISTORY  Chief Complaint Hydronephrosis   HPI [name] is a 69 y.o. female currently undergoing treatment for adenocarcinoma of the breast followed primarily at [place] presents to the emergency department after outpatient CT shows bilateral hydronephrosis in the setting of acute renal failure.  Patient has started to have some mild discomfort across her lower back and a slightly worse on the left.  She is urinating normally for her.  She denies any dysuria, hesitancy, urgency.  No fevers or chills.  No chest pain or shortness of breath.  The CT was ordered by [facility] and she was called and advised to present to the [facility] ED following the imaging read. No radiation of symptoms or modifying factors.    Past Medical History:  Diagnosis Date  . Adenocarcinoma of breast ([place]) 01/27/2011   stage IIIc breast cancer  . Bladder infection   . Bone metastasis ([place]) 01/17/2016  . Borderline hypertension   . Breast cancer ([place])   . Closed fracture of humerus sept 2013  . Depression   . Family history of adverse reaction to anesthesia    father remembered his surgery  . Fatigue   . GERD (gastroesophageal reflux disease)   . Hypertension   . 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 Osteoporosis 01/27/2011  . Pneumonia   . Port catheter in place 04/14/2013  . Post-mastectomy lymphedema syndrome   . 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 Reflux   . Retinal detachment   . 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 Swollen lymph nodes   . URI (upper respiratory infection) 02/17/12  . Wears glasses     Patient Active Problem List   Diagnosis Date Noted  . Hydronephrosis 06/04/2020  . AKI (acute kidney injury) ([place]) 06/03/2020  . Bilateral hydronephrosis 06/03/2020  . Acute bilateral  obstructive uropathy 06/03/2020  . Acute renal failure (ARF) ([place]) 06/03/2020  . 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 Coronary artery calcification seen on CT scan 03/22/2016  . 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 Bone metastasis ([place]) 01/17/2016  . 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 BRCA2 positive 12/16/2015  . Port catheter in place 04/14/2013  . Adenocarcinoma of left breast 01/27/2011  . Osteoporosis 01/27/2011    Past Surgical History:  Procedure Laterality Date  . ABDOMINAL PERINEAL BOWEL RESECTION N/A 09/01/2016   Procedure: ABDOMINAL PERINEAL RESECTION PERMANENT COLOSTOMY;  Surgeon: Leighton Ruff, MD;  Location: [facility];  Service: General;  Laterality: N/A;  . biopsy of lymph node     super clavicle  . BTL    . CATARACT EXTRACTION    . cervical disc fusioni    . CYSTOSCOPY W/ URETERAL STENT PLACEMENT Bilateral 06/03/2020   Procedure: CYSTOSCOPY WITH RETROGRADE PYELOGRAM/URETERAL STENT PLACEMENT bladder biopsy;  Surgeon: [name], MD;  Location: [facility];  Service: Urology;  Laterality: Bilateral;  . LYMPH NODE BIOPSY Right 06/19/2015   Procedure: RIGHT NECK LYMPH NODE EXCISION;  Surgeon: Rolm Bookbinder, MD;  Location: [facility];  Service: General;  Laterality: Right;  . LYMPH NODE DISSECTION Left 04/26/2017   Procedure: EXCISION LEFT NECK LYMPH NODE;  Surgeon: Rolm Bookbinder, MD;  Location: [facility];  Service: General;  Laterality: Left;  Marland Kitchen MASS EXCISION Left 07/27/2016   Procedure:  EXCISION OF POSTERIOR NECK MASS;  Surgeon: Rolm Bookbinder, MD;  Location: [facility];  Service: General;  Laterality: Left;  Marland Kitchen MASTECTOMY MODIFIED RADICAL     left breast  . Port- a-cath insertion    . retinal detachment and repair      Allergies Ciprofloxacin and Prochlorperazine  Family History  Problem Relation Age of Onset  . Hypertension Mother   . Heart disease Mother        atrial fib  . Heart disease Father   . Other Father         glaucoma    Social History Social History   Tobacco Use  . Smoking status: Never Smoker  . Smokeless tobacco: Never Used  Vaping Use  . Vaping Use: Never used  Substance Use Topics  . Alcohol use: Yes    Alcohol/week: 7.0 standard drinks    Types: 7 Glasses of wine per week    Comment: 1 drink daily  . Drug use: Yes    Types: Marijuana    Comment: During chemo, none over 1 Year    Review of Systems  Constitutional: No fever/chills Eyes: No visual changes. ENT: No sore throat. Cardiovascular: Denies chest pain. Respiratory: Denies shortness of breath. Gastrointestinal: No abdominal pain.  No nausea, no vomiting.  No diarrhea.  No constipation. Genitourinary: Negative for dysuria. Musculoskeletal: Positive for lower back pain. Skin: Negative for rash. Neurological: Negative for headaches, focal weakness or numbness.  10-point ROS otherwise negative.  ____________________________________________   PHYSICAL EXAM:  VITAL SIGNS: ED Triage Vitals  Enc Vitals Group     BP 06/03/20 1452 (!) 149/116     Pulse Rate 06/03/20 1452 83     Resp 06/03/20 1452 16     Temp 06/03/20 1452 99.1 F (37.3 C)     Temp Source 06/03/20 1452 Oral     SpO2 06/03/20 1452 94 %   Constitutional: Alert and oriented. Well appearing and in no acute distress. Eyes: Conjunctivae are normal.  Head: Atraumatic. Nose: No congestion/rhinnorhea. Mouth/Throat: Mucous membranes are moist.   Neck: No stridor.   Cardiovascular: Normal rate, regular rhythm. Respiratory: Normal respiratory effort.   Gastrointestinal: Soft and nontender. No distention.  Musculoskeletal: No gross deformities of extremities. Neurologic:  Normal speech and language.  Skin:  Skin is warm, dry and intact. No rash noted.  ____________________________________________   LABS (all labs ordered are listed, but only abnormal results are displayed)  Labs Reviewed  URINE CULTURE - Abnormal; Notable for the following  components:      Result Value   Culture   (*)    Value: 10,000 COLONIES/mL LACTOBACILLUS SPECIES Standardized susceptibility testing for this organism is not available. Performed at [facility], [place] [address] [place], [place] [address]    All other components within normal limits  COMPREHENSIVE METABOLIC PANEL - Abnormal; Notable for the following components:   CO2 19 (*)    BUN 54 (*)    Creatinine, Ser 5.03 (*)    Calcium 8.1 (*)    GFR, Estimated 9 (*)    All other components within normal limits  CBC WITH DIFFERENTIAL/PLATELET - Abnormal; Notable for the following components:   RBC 2.72 (*)    Hemoglobin 9.4 (*)    HCT 28.9 (*)    MCV 106.3 (*)    MCH 34.6 (*)    All other components within normal limits  URINALYSIS, ROUTINE W REFLEX MICROSCOPIC - Abnormal; Notable for the following components:  Color, Urine STRAW (*)    All other components within normal limits  BASIC METABOLIC PANEL - Abnormal; Notable for the following components:   CO2 21 (*)    Glucose, Bld 121 (*)    BUN 48 (*)    Creatinine, Ser 4.30 (*)    Calcium 8.7 (*)    GFR, Estimated 11 (*)    All other components within normal limits  CBC - Abnormal; Notable for the following components:   RBC 3.00 (*)    Hemoglobin 10.4 (*)    HCT 31.3 (*)    MCV 104.3 (*)    MCH 34.7 (*)    All other components within normal limits  RESPIRATORY PANEL BY RT PCR (FLU A&B, COVID)  HIV ANTIBODY (ROUTINE TESTING W REFLEX)  CBC  BASIC METABOLIC PANEL  SURGICAL PATHOLOGY   ____________________________________________  RADIOLOGY  CT RENAL STONE STUDY  Result Date: 06/03/2020 CLINICAL DATA:  Elevated creatinine, flank pain, no history of renal stones, history of left breast cancer EXAM: CT ABDOMEN AND PELVIS WITHOUT CONTRAST TECHNIQUE: Multidetector CT imaging of the abdomen and pelvis was performed following the standard protocol without IV contrast. COMPARISON:  PET-CT, 08/10/2019 FINDINGS: Lower chest:  Small bilateral pleural effusions and associated atelectasis or consolidation. Interlobular septal thickening throughout the included lower lungs, in addition to bandlike scarring or atelectasis. Hepatobiliary: No solid liver abnormality is seen. No gallstones, gallbladder wall thickening, or biliary dilatation. Pancreas: Unremarkable. No pancreatic ductal dilatation or surrounding inflammatory changes. Spleen: Normal in size without significant abnormality. Adrenals/Urinary Tract: Adrenal glands are unremarkable. There is moderate right, mild left hydronephrosis and proximal hydroureter without visualized obstructing calculi or other obvious etiology. There is retroperitoneal fat stranding in the vicinity of the mid ureters (series 2, image 42) and calcifications which are of the iliac vessels and do not appear to be within the ureters. Bladder is unremarkable. Stomach/Bowel: Stomach is within normal limits. The appendix is not clearly visualized. Status post abdominoperineal resection with left lower quadrant end colostomy. Vascular/Lymphatic: Aortic atherosclerosis. No enlarged abdominal or pelvic lymph nodes. Reproductive: The uterus is present although in an abnormally posterior location status post abdominoperineal resection. Other: No abdominal wall hernia or abnormality. No abdominopelvic ascites. Musculoskeletal: No acute or significant osseous findings. IMPRESSION: 1. There is moderate right, mild left hydronephrosis and proximal hydroureter without visualized obstructing calculi or other obvious etiology. There is retroperitoneal fat stranding in the vicinity of the mid ureters which is of uncertain nature. Contrast enhanced CT urogram may be helpful to better evaluate for obstructing etiology. 2. Status post abdominoperineal resection with left lower quadrant end colostomy. Appearance of the low pelvis is unchanged compared to prior PET-CT dated 08/10/2019 without evidence of mass or lymphadenopathy. 3.  Small bilateral pleural effusions and associated atelectasis or consolidation. Interlobular septal thickening throughout the included lower lungs, in addition to bandlike scarring or atelectasis. Findings are most consistent with pulmonary edema. 4. Aortic Atherosclerosis (ICD10-I70.0). Electronically Signed   By: Eddie Candle M.D.   On: 06/03/2020 11:31    ____________________________________________   PROCEDURES  Procedure(s) performed:   Procedures  None  ____________________________________________   INITIAL IMPRESSION / ASSESSMENT AND PLAN / ED COURSE  Pertinent labs & imaging results that were available during my care of the patient were reviewed by me and considered in my medical decision making (see chart for details).   Patient sent into the emergency department after outpatient CT shows bilateral hydronephrosis in the setting of acute renal failure.  Labs reviewed  from [place].  Will repeat blood work here along with UA and Covid swab.  Have paged urology to discuss CT findings and plan.  Discussed patient's case with Urology to request admission. Patient and family (if present) updated with plan. Care transferred to urology service.  I reviewed all nursing notes, vitals, pertinent old records, EKGs, labs, imaging (as available).  ____________________________________________  FINAL CLINICAL IMPRESSION(S) / ED DIAGNOSES  Final diagnoses:  Hydronephrosis, unspecified hydronephrosis type  Acute renal failure, unspecified acute renal failure type ([place])     MEDICATIONS GIVEN DURING THIS VISIT:  Medications  ceFAZolin (ANCEF) 2-4 GM/100ML-% IVPB (has no administration in time range)  fentaNYL (SUBLIMAZE) 100 MCG/2ML injection (has no administration in time range)  hydrALAZINE (APRESOLINE) 20 MG/ML injection (has no administration in time range)  zolpidem (AMBIEN) tablet 5 mg (5 mg Oral Given 06/03/20 2318)  pantoprazole (PROTONIX) injection 40 mg (40 mg  Intravenous Given 06/03/20 2307)  traMADol (ULTRAM) tablet 50-100 mg (has no administration in time range)  heparin injection 5,000 Units (5,000 Units Subcutaneous Given 06/04/20 1303)  acetaminophen (TYLENOL) tablet 650 mg (650 mg Oral Given 06/04/20 1730)    Or  acetaminophen (TYLENOL) suppository 650 mg ( Rectal See Alternative 06/04/20 1730)  ondansetron (ZOFRAN) tablet 4 mg (has no administration in time range)    Or  ondansetron (ZOFRAN) injection 4 mg (has no administration in time range)  letrozole [facility]) tablet 2.5 mg (2.5 mg Oral Given 06/04/20 1103)  metoprolol succinate (TOPROL-XL) 24 hr tablet 25 mg (25 mg Oral Given 06/04/20 1102)  clonazePAM (KLONOPIN) tablet 0.5 mg (has no administration in time range)  buPROPion (WELLBUTRIN SR) 12 hr tablet 100 mg (100 mg Oral Given 06/04/20 1103)  Chlorhexidine Gluconate Cloth 2 % PADS 6 each (6 each Topical Given 06/04/20 1105)  hydrALAZINE (APRESOLINE) tablet 25 mg (25 mg Oral Given 06/04/20 1501)  influenza vaccine adjuvanted (FLUAD) injection 0.5 mL (0.5 mLs Intramuscular Given 06/04/20 1130)  chlorhexidine (PERIDEX) 0.12 % solution 15 mL (15 mLs Mouth/Throat Given 06/03/20 1817)     Note:  This document was prepared using Dragon voice recognition software and may include unintentional dictation errors.  Nanda Quinton, MD, [facility] Emergency Medicine    Raman Featherston, [facility], MD 06/04/20 [phone]

## 2020-06-03 NOTE — Anesthesia Procedure Notes (Signed)
Date/Time: 06/03/2020 7:09 PM Performed by: Cynda Familia, CRNA Oxygen Delivery Method: Simple face mask Placement Confirmation: positive ETCO2 and breath sounds checked- equal and bilateral Dental Injury: Teeth and Oropharynx as per pre-operative assessment

## 2020-06-03 NOTE — H&P (Signed)
History and Physical    [name] VQQ:[id number] DOB: 01-01-1951 DOA: 06/03/2020  PCP: Celene Squibb, MD  Patient coming from: Home  I have personally briefly reviewed patient's old medical records in [place]  Chief Complaint: B Hydro, AKI  HPI: [name] is a 69 y.o. female with medical history significant of metastatic BRCA, BRCA2 mutation, currently on chemo holiday.  Pt with nausea, high BP worsening, increasing fatigue over past ~2 weeks or so.  Nothing makes symptoms better or worse.  Found to have AKI which has progressively worsened over that time.  CT scan of ABD/Pelvis as outpt showed B hydronephrosis.  Creat 5.0.  Sent to ED, and then taken to OR by Dr. Louis Meckel for placement of B ureteral stents.   ED Course: Post op: thankfully already having >1L UOP in the past hour alone.  Asked to obs pt overnight.   Review of Systems: As per HPI, otherwise all review of systems negative.  Past Medical History:  Diagnosis Date  . 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 Bladder infection   . Bone metastasis ([place]) 01/17/2016  . Borderline hypertension   . Breast cancer ([place])   . Closed fracture of humerus sept 2013  . Depression   . Family history of adverse reaction to anesthesia    father remembered his surgery  . Fatigue   . GERD (gastroesophageal reflux disease)   . Hypertension   . 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 Osteoporosis 01/27/2011  . Pneumonia   . Port catheter in place 04/14/2013  . Post-mastectomy lymphedema syndrome   . 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 Reflux   . Retinal detachment   . 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 Swollen lymph nodes   . URI (upper respiratory infection) 02/17/12  . Wears glasses     Past Surgical History:  Procedure Laterality Date  . ABDOMINAL PERINEAL BOWEL RESECTION N/A  09/01/2016   Procedure: ABDOMINAL PERINEAL RESECTION PERMANENT COLOSTOMY;  Surgeon: Leighton Ruff, MD;  Location: [facility];  Service: General;  Laterality: N/A;  . biopsy of lymph node     super clavicle  . BTL    . CATARACT EXTRACTION    . cervical disc fusioni    . LYMPH NODE BIOPSY Right 06/19/2015   Procedure: RIGHT NECK LYMPH NODE EXCISION;  Surgeon: Rolm Bookbinder, MD;  Location: [facility];  Service: General;  Laterality: Right;  . LYMPH NODE DISSECTION Left 04/26/2017   Procedure: EXCISION LEFT NECK LYMPH NODE;  Surgeon: Rolm Bookbinder, MD;  Location: [facility];  Service: General;  Laterality: Left;  Marland Kitchen MASS EXCISION Left 07/27/2016   Procedure: EXCISION OF POSTERIOR NECK MASS;  Surgeon: Rolm Bookbinder, MD;  Location: [facility];  Service: General;  Laterality: Left;  Marland Kitchen MASTECTOMY MODIFIED RADICAL     left breast  . Port- a-cath insertion    . retinal detachment and repair       reports that she has never smoked. She has never used smokeless tobacco. She reports current alcohol use of about 7.0 standard drinks of alcohol per week. She reports current drug use. Drug: Marijuana.  Allergies  Allergen Reactions  . Ciprofloxacin Other (See Comments)    tendonitis  . Prochlorperazine Anxiety    Other reaction(s): Confusion    Family History  Problem  Relation Age of Onset  . Hypertension Mother   . Heart disease Mother        atrial fib  . Heart disease Father   . Other Father        glaucoma     Prior to Admission medications   Medication Sig Start Date End Date Taking? Authorizing Provider  acetaminophen (TYLENOL) 500 MG tablet Take 1,000 mg by mouth every 6 (six) hours as needed for moderate pain or headache.   Yes [provider]  buPROPion (WELLBUTRIN SR) 100 MG 12 hr tablet Take 100 mg by mouth 2 (two) times daily.   Yes [provider]  CALCIUM-VITAMIN D PO Take 1 tablet by mouth 2 (two) times daily.    Yes [provider]  clonazePAM (KLONOPIN) 0.5 MG tablet Take 0.5 mg by mouth 2 (two) times daily as needed for anxiety.   Yes [provider]  denosumab (XGEVA) 120 MG/1.7ML SOLN injection Inject 120 mg into the skin every 3 (three) months.   Yes [provider]  letrozole (FEMARA) 2.5 MG tablet Take 2.5 mg by mouth daily.   Yes [provider]  metoprolol succinate (TOPROL-XL) 25 MG 24 hr tablet Take 25 mg by mouth in the morning and at bedtime.  04/23/16  Yes [provider]  omeprazole (PRILOSEC) 40 MG capsule Take 1 capsule (40 mg total) by mouth 2 (two) times daily. Patient taking differently: Take 40 mg by mouth in the morning and at bedtime.  03/19/16  Yes Holley Bouche, NP  potassium chloride (KLOR-CON) 10 MEQ tablet Take 20 mEq by mouth 2 (two) times daily.   Yes [provider]  vitamin B-12 (CYANOCOBALAMIN) 1000 MCG tablet Take 1,000 mcg by mouth 2 (two) times daily.    Yes [provider]    Physical Exam: Vitals:   06/03/20 1915 06/03/20 1924 06/03/20 1939 06/03/20 1954  BP: (!) 187/93 (!) 197/92    Pulse: 64 64 63 61  Resp: _0 Temp: 98.1 F (36.7 C)     TempSrc:      SpO2: 100% 100% 97% 97%  Weight:      Height:        Constitutional: NAD, calm, comfortable Eyes: PERRL, lids and conjunctivae normal ENMT: Mucous membranes are moist. Posterior pharynx clear of any exudate or lesions.Normal dentition.  Neck: normal, supple, no masses, no thyromegaly Respiratory: clear to auscultation bilaterally, no wheezing, no crackles. Normal respiratory effort. No accessory muscle use.  Cardiovascular: Regular rate and rhythm, no murmurs / rubs / gallops. No extremity edema. 2+ pedal pulses. No carotid bruits.  Abdomen: no tenderness, no masses palpated. No hepatosplenomegaly. Bowel sounds positive.  Musculoskeletal: no clubbing / cyanosis. No joint deformity upper and lower extremities. Good ROM, no contractures. Normal  muscle tone.  Skin: no rashes, lesions, ulcers. No induration Neurologic: CN 2-12 grossly intact. Sensation intact, DTR normal. Strength 5/5 in all 4.  Psychiatric: Normal judgment and insight. Alert and oriented x 3. Normal mood.    Labs on Admission: I have personally reviewed following labs and imaging studies  CBC: Recent Labs  Lab 06/03/20 1613  WBC 6.5  NEUTROABS 4.6  HGB 9.4*  HCT 28.9*  MCV 106.3*  PLT 977   Basic Metabolic Panel: Recent Labs  Lab 06/03/20 1613  NA 141  K 4.8  CL 109  CO2 19*  GLUCOSE 81  BUN 54*  CREATININE 5.03*  CALCIUM 8.1*   GFR: Estimated  Creatinine Clearance: 9.8 mL/min (A) (by C-G formula based on SCr of 5.03 mg/dL (H)). Liver Function Tests: Recent Labs  Lab 06/03/20 1613  AST 32  ALT 31  ALKPHOS 55  BILITOT 0.7  PROT 6.8  ALBUMIN 3.8   No results for input(s): LIPASE, AMYLASE in the last 168 hours. No results for input(s): AMMONIA in the last 168 hours. Coagulation Profile: No results for input(s): INR, PROTIME in the last 168 hours. Cardiac Enzymes: No results for input(s): CKTOTAL, CKMB, CKMBINDEX, TROPONINI in the last 168 hours. BNP (last 3 results) No results for input(s): PROBNP in the last 8760 hours. HbA1C: No results for input(s): HGBA1C in the last 72 hours. CBG: No results for input(s): GLUCAP in the last 168 hours. Lipid Profile: No results for input(s): CHOL, HDL, LDLCALC, TRIG, CHOLHDL, LDLDIRECT in the last 72 hours. Thyroid Function Tests: No results for input(s): TSH, T4TOTAL, FREET4, T3FREE, THYROIDAB in the last 72 hours. Anemia Panel: No results for input(s): VITAMINB12, FOLATE, FERRITIN, TIBC, IRON, RETICCTPCT in the last 72 hours. Urine analysis:    Component Value Date/Time   COLORURINE STRAW (A) 06/03/2020 1613   APPEARANCEUR CLEAR 06/03/2020 1613   LABSPEC 1.008 06/03/2020 1613   PHURINE 6.0 06/03/2020 1613   GLUCOSEU NEGATIVE 06/03/2020 1613   HGBUR NEGATIVE 06/03/2020 1613    BILIRUBINUR NEGATIVE 06/03/2020 1613   KETONESUR NEGATIVE 06/03/2020 1613   PROTEINUR NEGATIVE 06/03/2020 1613   UROBILINOGEN 0.2 08/06/2008 1500   NITRITE NEGATIVE 06/03/2020 1613   LEUKOCYTESUR NEGATIVE 06/03/2020 1613    Radiological Exams on Admission: DG C-Arm 1-60 Min-No Report  Result Date: 06/03/2020 Fluoroscopy was utilized by the requesting physician.  No radiographic interpretation.   CT RENAL STONE STUDY  Result Date: 06/03/2020 CLINICAL DATA:  Elevated creatinine, flank pain, no history of renal stones, history of left breast cancer EXAM: CT ABDOMEN AND PELVIS WITHOUT CONTRAST TECHNIQUE: Multidetector CT imaging of the abdomen and pelvis was performed following the standard protocol without IV contrast. COMPARISON:  PET-CT, 08/10/2019 FINDINGS: Lower chest: Small bilateral pleural effusions and associated atelectasis or consolidation. Interlobular septal thickening throughout the included lower lungs, in addition to bandlike scarring or atelectasis. Hepatobiliary: No solid liver abnormality is seen. No gallstones, gallbladder wall thickening, or biliary dilatation. Pancreas: Unremarkable. No pancreatic ductal dilatation or surrounding inflammatory changes. Spleen: Normal in size without significant abnormality. Adrenals/Urinary Tract: Adrenal glands are unremarkable. There is moderate right, mild left hydronephrosis and proximal hydroureter without visualized obstructing calculi or other obvious etiology. There is retroperitoneal fat stranding in the vicinity of the mid ureters (series 2, image 42) and calcifications which are of the iliac vessels and do not appear to be within the ureters. Bladder is unremarkable. Stomach/Bowel: Stomach is within normal limits. The appendix is not clearly visualized. Status post abdominoperineal resection with left lower quadrant end colostomy. Vascular/Lymphatic: Aortic atherosclerosis. No enlarged abdominal or pelvic lymph nodes. Reproductive: The  uterus is present although in an abnormally posterior location status post abdominoperineal resection. Other: No abdominal wall hernia or abnormality. No abdominopelvic ascites. Musculoskeletal: No acute or significant osseous findings. IMPRESSION: 1. There is moderate right, mild left hydronephrosis and proximal hydroureter without visualized obstructing calculi or other obvious etiology. There is retroperitoneal fat stranding in the vicinity of the mid ureters which is of uncertain nature. Contrast enhanced CT urogram may be helpful to better evaluate for obstructing etiology. 2. Status post abdominoperineal resection with left lower quadrant end colostomy. Appearance of the low pelvis is unchanged compared to prior  PET-CT dated 08/10/2019 without evidence of mass or lymphadenopathy. 3. Small bilateral pleural effusions and associated atelectasis or consolidation. Interlobular septal thickening throughout the included lower lungs, in addition to bandlike scarring or atelectasis. Findings are most consistent with pulmonary edema. 4. Aortic Atherosclerosis (ICD10-I70.0). Electronically Signed   By: Eddie Candle M.D.   On: 06/03/2020 11:31    EKG: Independently reviewed.  Assessment/Plan Principal Problem:   Acute bilateral obstructive uropathy Active Problems:   Adenocarcinoma of left breast   Anal cancer (HCC)   AKI (acute kidney injury) ([place])   Bilateral hydronephrosis   Acute renal failure (ARF) ([place])    1. Acute B obstructive uropathy causing AKI - 1. Severe AKI with creat of 5. 2. S/p B ureteral stents 3. Also had bladder wall biopsy 4. Good news is that she is already having large amount of UOP (Post obstructive diuresis). 5. BP 528U systolic so going to hold off on IVF for the moment and just let pt take POs. 6. Repeat BMP in AM 7. Strict intake and output 8. Call nephro if no rapid improvement in renal function (though with 1L UOP in ~1 hr, I think we are already seeing said  improvement). 2. Metastatic BRCA - 1. Unfortunately, even though we cant see specific mass finding on CT scan, the suspicion is that the CA may have reoccurred (per patient its "right on schedule to re-occur" right now) and is what was causing her B ureteral obstructions (extrinsic compression of ureters). 2. Have informed pt of this concern. 3. She confirms to me she is DNR from a CA perspective. 4. Needs to f/u with oncology. 3. H/o Anal cancer - Believed to be in remission since 2018. 4. HTN - 1. Cont metoprolol 2. Monitor overnight but expect BP to drop some as kidneys now working and large volume UOP.  DVT prophylaxis: Heparin [place] Code Status: DNR - confirmed with pt Family Communication: No family at bedside, Sister is POA, Sister is aware of BRCA 2 mutation status. Disposition Plan: Home, possibly tomorrow Consults called: Dr. Louis Meckel Admission status: Place in obs    GARDNER, JARED M. DO [facility]  How to contact the [facility] Attending or Consulting provider [place] or covering provider during after hours [place], for this patient?  1. Check the care team in [facility] and look for a) attending/consulting TR[facility] provider listed and b) the [facility] team listed 2. Log into [email or web address]  Amion Physician Scheduling and messaging for groups and whole hospitals  On call and physician scheduling software for group practices, residents, hospitalists and other medical providers for call, clinic, rotation and shift schedules. OnCall [employer] is a hospital-wide system for scheduling doctors and paging doctors on call. EasyPlot is for scientific plotting and data analysis.  [email or web address]  and use 's universal password to access. If you do not have the password, please contact the hospital operator.  3. Locate the [facility] provider you are looking for under [facility] and page to a number that you can be directly reached. 4. If you still have difficulty reaching the provider, please  page the [facility] (Director on Call) for the Hospitalists listed on amion for assistance.  06/03/2020, 8:08 PM

## 2020-06-03 NOTE — Anesthesia Procedure Notes (Signed)
Procedure Name: LMA Insertion Date/Time: 06/03/2020 6:38 PM Performed by: Cynda Familia, CRNA Pre-anesthesia Checklist: Patient identified, Emergency Drugs available, Suction available and Patient being monitored Patient Re-evaluated:Patient Re-evaluated prior to induction Oxygen Delivery Method: Circle System Utilized Preoxygenation: Pre-oxygenation with 100% oxygen Induction Type: IV induction Ventilation: Mask ventilation without difficulty LMA: LMA inserted LMA Size: 4.0 Number of attempts: 1 Placement Confirmation: positive ETCO2 Tube secured with: Tape Dental Injury: Teeth and Oropharynx as per pre-operative assessment  Comments: Smooth IV induction [place]-- intubation AM CRNA atraumatic-- teeth and mouth as per op- bilat BS [place]

## 2020-06-03 NOTE — Telephone Encounter (Signed)
Pt sister called and notified of Dr. Alyson Ingles recommendations.   Voiced understanding.

## 2020-06-03 NOTE — Op Note (Signed)
Preoperative diagnosis:  1. Bilateral hydronephrosis 2. Acute renal failure 3. Advanced breast cancer  Postoperative diagnosis:  1. Same  Procedure: 1. Cystoscopy, bilateral retrograde with interpretation 2. Bilateral ureteral stent placement 3. Bladder biopsy  Surgeon: [name], MD  Anesthesia: General  Complications: None  Intraoperative findings:  #1: The patient's vagina and urethra were atrophic, and I had to dilate the patient's urethra to accommodate the 21 [place] scope. #2: Cystoscopy demonstrated some abnormal appearing mucosa on the right lateral wall of the bladder and trigone region.  This was consistent with superficial TCC, but also may just be consistent with radiation changes.  I did perform a biopsy of this area. #3: The patient's bladder at the dome had fallen and was at a inverted angle, but there was no prolapse through the vagina. #4: The patient's ureteral orifice ease were orthotopic in location. #5: The patient's right retrograde pyelogram demonstrated some narrowing at the UVJ with dilation of the ureter all the way up to the UPJ where it was torturous.  I was able to straighten out the ureter with the stent. #6: The left retrograde pyelogram was abnormal and demonstrated some narrowing at the UVJ and dilation of the distal ureter with again another narrowing at the mid to proximal ureter.  There was less dilation on the left than the right.  There were no filling defects.  Drains: Bilateral 24 cm x 6 French Polaris ureteral stents  EBL: Minimal  Specimens: Bladder biopsy  Indication: [name] is a 69 y.o. patient with bilateral hydronephrosis and renal failure with a history of metastatic breast cancer..  After reviewing the management options for treatment, he elected to proceed with the above surgical procedure(s). We have discussed the potential benefits and risks of the procedure, side effects of the proposed treatment, the likelihood of  the patient achieving the goals of the procedure, and any potential problems that might occur during the procedure or recuperation. Informed consent has been obtained.  Description of procedure:  The patient was taken to the operating room and general anesthesia was induced.  The patient was placed in the dorsal lithotomy position, prepped and draped in the usual sterile fashion, and preoperative antibiotics were administered. A preoperative time-out was performed.   The urethra was dilated with the 21 French obturator and the 21 French sheath was then inserted and the obturator exchanged for the cystoscope.  Cystoscopy was then performed with the above findings.  I then performed a right retrograde pyelogram with the above findings and placed a 24 cm x 5 French Polaris stent.  This same procedure was performed on the patient's left side in a similar 24 cm x 6 French Polaris stent placed on the patient's left side.  I then used the bladder biopsy forceps and biopsied an area around the right lateral wall.  This was then fulgurated.  I then passed a 54 French Foley catheter into the patient's bladder.  She was subsequently awoken and returned to the PACU in good condition.  [name], M.D.

## 2020-06-03 NOTE — Telephone Encounter (Signed)
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 She needs to go to [facility] ER ----- Message ----- From: Dorisann Frames, RN Sent: 06/03/2020  12:03 PM EDT To: Cleon Gustin, MD  Please review

## 2020-06-03 NOTE — Discharge Instructions (Signed)
DISCHARGE INSTRUCTIONS FOR URETERAL STENT   MEDICATIONS:  1. Resume all your other meds from home - except do not take any extra narcotic pain meds that you may have at home.    ACTIVITY:  1. No strenuous activity x 1week  2. No driving while on narcotic pain medications  3. Drink plenty of water  4. Continue to walk at home - you can still get blood clots when you are at home, so keep active, but don't over do it.  5. May return to work/school tomorrow or when you feel ready   BATHING:  1. You can shower and we recommend daily showers  2. You have a string coming from your urethra: The stent string is attached to your ureteral stent. Do not pull on this.   SIGNS/SYMPTOMS TO CALL:  Please call us if you have a fever greater than 101.5, uncontrolled nausea/vomiting, uncontrolled pain, dizziness, unable to urinate, bloody urine, chest pain, shortness of breath, leg swelling, leg pain, redness around wound, drainage from wound, or any other concerns or questions.   You can reach [place] at [phone].   FOLLOW-UP:  1. You should call and make an appointment with Dr. Louis Meckel in 2 months.

## 2020-06-04 ENCOUNTER — Encounter (HOSPITAL_COMMUNITY): Payer: Self-pay | Admitting: Urology

## 2020-06-04 DIAGNOSIS — K219 Gastro-esophageal reflux disease without esophagitis: Secondary | ICD-10-CM | POA: Diagnosis not present

## 2020-06-04 DIAGNOSIS — D539 Nutritional anemia, unspecified: Secondary | ICD-10-CM | POA: Diagnosis present

## 2020-06-04 DIAGNOSIS — N1831 Chronic kidney disease, stage 3a: Secondary | ICD-10-CM | POA: Diagnosis present

## 2020-06-04 DIAGNOSIS — N1330 Unspecified hydronephrosis: Secondary | ICD-10-CM | POA: Diagnosis not present

## 2020-06-04 DIAGNOSIS — Z23 Encounter for immunization: Secondary | ICD-10-CM | POA: Diagnosis not present

## 2020-06-04 DIAGNOSIS — Z8249 Family history of ischemic heart disease and other diseases of the circulatory system: Secondary | ICD-10-CM | POA: Diagnosis not present

## 2020-06-04 DIAGNOSIS — C50912 Malignant neoplasm of unspecified site of left female breast: Secondary | ICD-10-CM | POA: Diagnosis not present

## 2020-06-04 DIAGNOSIS — C7951 Secondary malignant neoplasm of bone: Secondary | ICD-10-CM | POA: Diagnosis not present

## 2020-06-04 DIAGNOSIS — Z9012 Acquired absence of left breast and nipple: Secondary | ICD-10-CM | POA: Diagnosis not present

## 2020-06-04 DIAGNOSIS — Z9221 Personal history of antineoplastic chemotherapy: Secondary | ICD-10-CM | POA: Diagnosis not present

## 2020-06-04 DIAGNOSIS — Z79899 Other long term (current) drug therapy: Secondary | ICD-10-CM | POA: Diagnosis not present

## 2020-06-04 DIAGNOSIS — Z66 Do not resuscitate: Secondary | ICD-10-CM | POA: Diagnosis not present

## 2020-06-04 DIAGNOSIS — Z85048 Personal history of other malignant neoplasm of rectum, rectosigmoid junction, and anus: Secondary | ICD-10-CM | POA: Diagnosis not present

## 2020-06-04 DIAGNOSIS — Z87440 Personal history of urinary (tract) infections: Secondary | ICD-10-CM | POA: Diagnosis not present

## 2020-06-04 DIAGNOSIS — I129 Hypertensive chronic kidney disease with stage 1 through stage 4 chronic kidney disease, or unspecified chronic kidney disease: Secondary | ICD-10-CM | POA: Diagnosis not present

## 2020-06-04 DIAGNOSIS — I429 Cardiomyopathy, unspecified: Secondary | ICD-10-CM | POA: Diagnosis not present

## 2020-06-04 DIAGNOSIS — F419 Anxiety disorder, unspecified: Secondary | ICD-10-CM | POA: Diagnosis not present

## 2020-06-04 DIAGNOSIS — D638 Anemia in other chronic diseases classified elsewhere: Secondary | ICD-10-CM | POA: Diagnosis present

## 2020-06-04 DIAGNOSIS — T83192A Other mechanical complication of urinary stent, initial encounter: Secondary | ICD-10-CM | POA: Diagnosis not present

## 2020-06-04 DIAGNOSIS — N179 Acute kidney failure, unspecified: Secondary | ICD-10-CM | POA: Diagnosis not present

## 2020-06-04 DIAGNOSIS — I2510 Atherosclerotic heart disease of native coronary artery without angina pectoris: Secondary | ICD-10-CM | POA: Diagnosis not present

## 2020-06-04 DIAGNOSIS — Z20822 Contact with and (suspected) exposure to covid-19: Secondary | ICD-10-CM | POA: Diagnosis not present

## 2020-06-04 DIAGNOSIS — M810 Age-related osteoporosis without current pathological fracture: Secondary | ICD-10-CM | POA: Diagnosis present

## 2020-06-04 DIAGNOSIS — I10 Essential (primary) hypertension: Secondary | ICD-10-CM | POA: Diagnosis not present

## 2020-06-04 DIAGNOSIS — C210 Malignant neoplasm of anus, unspecified: Secondary | ICD-10-CM | POA: Diagnosis not present

## 2020-06-04 DIAGNOSIS — Z960 Presence of urogenital implants: Secondary | ICD-10-CM | POA: Diagnosis not present

## 2020-06-04 DIAGNOSIS — F32A Depression, unspecified: Secondary | ICD-10-CM | POA: Diagnosis not present

## 2020-06-04 DIAGNOSIS — N139 Obstructive and reflux uropathy, unspecified: Secondary | ICD-10-CM | POA: Diagnosis not present

## 2020-06-04 LAB — BASIC METABOLIC PANEL
Anion gap: 14 (ref 5–15)
BUN: 48 mg/dL — ABNORMAL HIGH (ref 8–23)
CO2: 21 mmol/L — ABNORMAL LOW (ref 22–32)
Calcium: 8.7 mg/dL — ABNORMAL LOW (ref 8.9–10.3)
Chloride: 108 mmol/L (ref 98–111)
Creatinine, Ser: 4.3 mg/dL — ABNORMAL HIGH (ref 0.44–1.00)
GFR, Estimated: 11 mL/min — ABNORMAL LOW (ref >60)
Glucose, Bld: 121 mg/dL — ABNORMAL HIGH (ref 70–99)
Potassium: 4.1 mmol/L (ref 3.5–5.1)
Sodium: 143 mmol/L (ref 135–145)

## 2020-06-04 LAB — HIV ANTIBODY (ROUTINE TESTING W REFLEX): HIV Screen 4th Generation wRfx: NONREACTIVE

## 2020-06-04 LAB — CBC
HCT: 31.3 % — ABNORMAL LOW (ref 36.0–46.0)
Hemoglobin: 10.4 g/dL — ABNORMAL LOW (ref 12.0–15.0)
MCH: 34.7 pg — ABNORMAL HIGH (ref 26.0–34.0)
MCHC: 33.2 g/dL (ref 30.0–36.0)
MCV: 104.3 fL — ABNORMAL HIGH (ref 80.0–100.0)
Platelets: 253 10*3/uL (ref 150–400)
RBC: 3 MIL/uL — ABNORMAL LOW (ref 3.87–5.11)
RDW: 12 % (ref 11.5–15.5)
WBC: 5.2 10*3/uL (ref 4.0–10.5)
nRBC: 0 % (ref 0.0–0.2)

## 2020-06-04 LAB — URINE CULTURE: Culture: 10000 — AB

## 2020-06-04 MED ORDER — SODIUM CHLORIDE 0.9 % IV SOLN: INTRAVENOUS | Status: AC

## 2020-06-04 MED ORDER — HYDRALAZINE HCL 25 MG PO TABS
25.0000 mg | ORAL_TABLET | Freq: Three times a day (TID) | ORAL | Status: DC | PRN
  Administered 2020-06-04 – 2020-06-05 (×3): 25 mg via ORAL
  Filled 2020-06-04 (×3): qty 1

## 2020-06-04 MED ORDER — CHLORHEXIDINE GLUCONATE CLOTH 2 % EX PADS
6.0000 | MEDICATED_PAD | Freq: Every day | CUTANEOUS | Status: DC
  Administered 2020-06-04 – 2020-06-08 (×4): 6 via TOPICAL

## 2020-06-04 NOTE — Plan of Care (Signed)
Pt. Admitted from PACU, s/p bilateral stents placement due to bladder obstruction. Alert and oriented x 4, VSS. Denies pain, foley patent, draining clear yellow urine, right chest PAC patent, dressing CDI. All safety and comfort measures in place.

## 2020-06-04 NOTE — Anesthesia Postprocedure Evaluation (Signed)
Anesthesia Post Note  Patient: YULA CROTWELL  Procedure(s) Performed: CYSTOSCOPY WITH RETROGRADE PYELOGRAM/URETERAL STENT PLACEMENT bladder biopsy (Bilateral Ureter)     Patient location during evaluation: PACU Anesthesia Type: General Level of consciousness: awake and alert Pain management: pain level controlled Vital Signs Assessment: post-procedure vital signs reviewed and stable Respiratory status: spontaneous breathing, nonlabored ventilation, respiratory function stable and patient connected to nasal cannula oxygen Cardiovascular status: blood pressure returned to baseline and stable Postop Assessment: no apparent nausea or vomiting Anesthetic complications: no   No complications documented.  Last Vitals:  Vitals:   06/04/20 0058 06/04/20 0505  BP: (!) 161/76 (!) 159/77  Pulse: 75 75  Resp: 17 20  Temp: 36.6 C 36.7 C  SpO2: 98% 97%    Last Pain:  Vitals:   06/04/20 0505  TempSrc: Oral  PainSc:                  Karyl Kinnier Natalya Domzalski

## 2020-06-04 NOTE — Progress Notes (Signed)
PROGRESS NOTE   [name]  DVV:[id number]    DOB: 1951/01/20    DOA: 06/03/2020  PCP: Celene Squibb, MD   I have briefly reviewed patients previous medical records in [facility].  Chief Complaint  Patient presents with  . Hydronephrosis    Brief Narrative:  69 year old female with PMH of metastatic breast cancer to bone, currently on treatment holiday at her request, has not received chemotherapy since 02/23/2020, follows with Dr. 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  Noted to have acute kidney injury complicating underlying CKD and CT scan of abdomen and pelvis done as outpatient showed bilateral hydronephrosis, creatinine up to 5.  Urology consulted and s/p cystoscopy, bilateral ureteral stent placement and bladder biopsy on 10/25.   Assessment & Plan:  Principal Problem:   Acute bilateral obstructive uropathy Active Problems:   Adenocarcinoma of left breast   Anal cancer (HCC)   AKI (acute kidney injury) ([place])   Bilateral hydronephrosis   Acute renal failure (ARF) (HCC)   Acute kidney injury due to obstructive uropathy, complicating underlying stage IIIa CKD Creatinine 1.21 on 04/17/2020 in care everywhere which has progressively worsened since then.  Presented with creatinine of 5.03.  Main cause for her acute kidney injury is the obstruction.  Urology consultation appreciated.  Etiology of hydronephrosis is unclear but may be related to metastatic breast cancer or scar tissue from her previous treatment for her anal cancer.  S/p cystoscopy, bilateral retrograde with interpretation, bilateral ureteral stent placement and  bladder biopsy.  Noted narrowing at the UVJ bilaterally.  Foley catheter in place.  Creatinine has slightly improved to 4.3.  We will add gentle IV hydration, continue Foley catheter and follow daily BMP and expect further improvement of her renal insufficiency.  Strict intake output.  If AKI does not steadily improve, consider nephrology consultation.  Although CT abdomen report suggests pulmonary edema, clinically no features to suggest CHF or pulmonary edema.  Metastatic breast cancer to bone: Follows with Dr. Verdell Carmine, oncologist at [facility] but patient is exploring options of transferring care over to [facility].  Outpatient follow-up.  History of anal cancer, s/p end colostomy Believed to be in remission since 2018.  Essential hypertension Mildly uncontrolled at times.  Continue Toprol-XL 25 mg twice daily.  Added as needed hydralazine.  Macrocytic anemia/anemia of chronic disease Stable.  Follow CBC.  Anxiety/depression: Continue bupropion.  Body mass index is 21.97 kg/m.   DVT prophylaxis: heparin injection 5,000 Units Start: 06/04/20 0600     Code Status: DNR Family Communication: None at bedside Disposition:  Status is: Observation  The patient will require care spanning > 2 midnights and should be moved to inpatient because: Inpatient level of care appropriate due to severity of illness  Dispo: The patient is from: Home              Anticipated d/c is to: Home              Anticipated d/c date is: 3 days              Patient currently is not medically stable to d/c.        Consultants:  Urology  Procedures:   06/03/2020:  1. Cystoscopy, bilateral retrograde with interpretation 2. Bilateral ureteral stent placement 3. Bladder biopsy  Antimicrobials:    Anti-infectives (From admission, onward)   Start     Dose/Rate Route Frequency Ordered Stop   06/03/20 1813  ceFAZolin (ANCEF) 2-4 GM/100ML-% IVPB       Note to Pharmacy: Sandrea Matte   : cabinet override      06/03/20 1813 06/04/20 0629        Subjective:  Patient unable to say if she feels any different than on admission.  Had mild nausea this morning but states that she has this when she has not eaten or drank anything and had not had anything yet this morning.  Has not been out of bed to tell how her weakness was doing.  Denied pain.  Wanted to know when she would be going home.  Objective:   Vitals:   06/03/20 2256 06/03/20 2352 06/04/20 0058 06/04/20 0505  BP: (!) 163/87 (!) 153/81 (!) 161/76 (!) 159/77  Pulse: 66 69 75 75  Resp: 18 18 17 20   Temp: 97.6 F (36.4 C) 97.9 F (36.6 C) 97.8 F (36.6 C) 98 F (36.7 C)  TempSrc: Oral Oral Oral Oral  SpO2: 97% 97% 98% 97%  Weight:      Height:        General exam: Pleasant middle-aged female, moderately built and nourished lying comfortably propped up in bed without distress.  Oral mucosa with borderline hydration. Respiratory system: Clear to auscultation. Respiratory effort normal.  Right upper anterior chest wall Port-A-Cath. Cardiovascular system: S1 & S2 heard, RRR. No JVD, murmurs, rubs, gallops or clicks. No pedal edema. Gastrointestinal system: Abdomen is nondistended, soft and nontender. No organomegaly or masses felt. Normal bowel sounds heard.  Left-sided colostomy +. GU: Foley catheter in place with mild bloodstained urine/hematuria. Central nervous system: Alert and oriented. No focal neurological deficits. Extremities: Symmetric 5 x 5 power. Skin: No rashes, lesions or ulcers Psychiatry: Judgement and insight appear normal. Mood & affect appropriate.     Data Reviewed:   I have personally reviewed following labs and imaging studies   CBC: Recent Labs  Lab 06/03/20 1613 06/04/20 0823  WBC 6.5 5.2  NEUTROABS 4.6  --   HGB 9.4* 10.4*  HCT 28.9* 31.3*  MCV 106.3* 104.3*  PLT 232 299    Basic Metabolic Panel: Recent Labs  Lab 06/03/20 1613 06/04/20 0823  NA 141 143  K  4.8 4.1  CL 109 108  CO2 19* 21*  GLUCOSE 81 121*  BUN 54* 48*  CREATININE 5.03* 4.30*  CALCIUM 8.1* 8.7*    Liver Function Tests: Recent Labs  Lab 06/03/20 1613  AST 32  ALT 31  ALKPHOS 55  BILITOT 0.7  PROT 6.8  ALBUMIN 3.8    CBG: No results for input(s): GLUCAP in the last 168 hours.  Microbiology Studies:   Recent Results (from the past 240 hour(s))  Respiratory Panel by RT PCR (Flu A&B, Covid) - Nasopharyngeal Swab     Status: None   Collection Time: 06/03/20  4:13 PM   Specimen: Nasopharyngeal Swab  Result Value Ref Range Status   SARS Coronavirus 2 by RT PCR NEGATIVE NEGATIVE Final    Comment: (NOTE) SARS-CoV-2 target nucleic acids are NOT DETECTED.  The SARS-CoV-2 RNA is generally detectable in upper respiratoy specimens during the acute phase of infection. The lowest concentration of SARS-CoV-2 viral copies this assay can detect is 131  copies/mL. A negative result does not preclude SARS-Cov-2 infection and should not be used as the sole basis for treatment or other patient management decisions. A negative result may occur with  improper specimen collection/handling, submission of specimen other than nasopharyngeal swab, presence of viral mutation(s) within the areas targeted by this assay, and inadequate number of viral copies (<131 copies/mL). A negative result must be combined with clinical observations, patient history, and epidemiological information. The expected result is Negative.  Fact Sheet for Patients:  PinkCheek.be  Fact Sheet for Healthcare Providers:  GravelBags.it  This test is no t yet approved or cleared by the [place] FDA and  has been authorized for detection and/or diagnosis of SARS-CoV-2 by FDA under an Emergency Use Authorization (EUA). This EUA will remain  in effect (meaning this test can be used) for the duration of the COVID-19 declaration under Section  564(b)(1) of the Act, 21 U.S.C. section 360bbb-3(b)(1), unless the authorization is terminated or revoked sooner.     Influenza A by PCR NEGATIVE NEGATIVE Final   Influenza B by PCR NEGATIVE NEGATIVE Final    Comment: (NOTE) The Xpert Xpress SARS-CoV-2/FLU/RSV assay is intended as an aid in  the diagnosis of influenza from Nasopharyngeal swab specimens and  should not be used as a sole basis for treatment. Nasal washings and  aspirates are unacceptable for Xpert Xpress SARS-CoV-2/FLU/RSV  testing.  Fact Sheet for Patients: PinkCheek.be  Fact Sheet for Healthcare Providers: GravelBags.it  This test is not yet approved or cleared by the [place] FDA and  has been authorized for detection and/or diagnosis of SARS-CoV-2 by  FDA under an Emergency Use Authorization (EUA). This EUA will remain  in effect (meaning this test can be used) for the duration of the  Covid-19 declaration under Section 564(b)(1) of the Act, 21  U.S.C. section 360bbb-3(b)(1), unless the authorization is  terminated or revoked. Performed at [facility], [place] [address], [place], [place] [address]      Radiology Studies:  DG C-Arm 1-60 Min-No Report  Result Date: 06/03/2020 Fluoroscopy was utilized by the requesting physician.  No radiographic interpretation.   CT RENAL STONE STUDY  Result Date: 06/03/2020 CLINICAL DATA:  Elevated creatinine, flank pain, no history of renal stones, history of left breast cancer EXAM: CT ABDOMEN AND PELVIS WITHOUT CONTRAST TECHNIQUE: Multidetector CT imaging of the abdomen and pelvis was performed following the standard protocol without IV contrast. COMPARISON:  PET-CT, 08/10/2019 FINDINGS: Lower chest: Small bilateral pleural effusions and associated atelectasis or consolidation. Interlobular septal thickening throughout the included lower lungs, in addition to bandlike scarring or atelectasis.  Hepatobiliary: No solid liver abnormality is seen. No gallstones, gallbladder wall thickening, or biliary dilatation. Pancreas: Unremarkable. No pancreatic ductal dilatation or surrounding inflammatory changes. Spleen: Normal in size without significant abnormality. Adrenals/Urinary Tract: Adrenal glands are unremarkable. There is moderate right, mild left hydronephrosis and proximal hydroureter without visualized obstructing calculi or other obvious etiology. There is retroperitoneal fat stranding in the vicinity of the mid ureters (series 2, image 42) and calcifications which are of the iliac vessels and do not appear to be within the ureters. Bladder is unremarkable. Stomach/Bowel: Stomach is within normal limits. The appendix is not clearly visualized. Status post abdominoperineal resection with left lower quadrant end colostomy. Vascular/Lymphatic: Aortic atherosclerosis. No enlarged abdominal or pelvic lymph nodes. Reproductive: The uterus is present although in an abnormally posterior location status post abdominoperineal resection. Other: No abdominal wall hernia or abnormality. No abdominopelvic ascites. Musculoskeletal:  No acute or significant osseous findings. IMPRESSION: 1. There is moderate right, mild left hydronephrosis and proximal hydroureter without visualized obstructing calculi or other obvious etiology. There is retroperitoneal fat stranding in the vicinity of the mid ureters which is of uncertain nature. Contrast enhanced CT urogram may be helpful to better evaluate for obstructing etiology. 2. Status post abdominoperineal resection with left lower quadrant end colostomy. Appearance of the low pelvis is unchanged compared to prior PET-CT dated 08/10/2019 without evidence of mass or lymphadenopathy. 3. Small bilateral pleural effusions and associated atelectasis or consolidation. Interlobular septal thickening throughout the included lower lungs, in addition to bandlike scarring or atelectasis.  Findings are most consistent with pulmonary edema. 4. Aortic Atherosclerosis (ICD10-I70.0). Electronically Signed   By: Eddie Candle M.D.   On: 06/03/2020 11:31     Scheduled Meds:   . buPROPion  100 mg Oral BID  . Chlorhexidine Gluconate Cloth  6 each Topical Daily  . heparin  5,000 Units Subcutaneous Q8H  . letrozole  2.5 mg Oral Daily  . metoprolol succinate  25 mg Oral BID  . pantoprazole (PROTONIX) IV  40 mg Intravenous Q24H    Continuous Infusions:     LOS: 0 days     Vernell Leep, MD, [place], [facility]. [facility]    To contact the attending provider between 7A-7P or the covering provider during after hours 7P-7A, please log into the web site [email or web address] and access using universal [place] password for that web site. If you do not have the password, please call the hospital operator.  06/04/2020, 12:55 PM

## 2020-06-05 ENCOUNTER — Inpatient Hospital Stay (HOSPITAL_COMMUNITY): Payer: Medicare Other

## 2020-06-05 LAB — CBC
HCT: 32.8 % — ABNORMAL LOW (ref 36.0–46.0)
Hemoglobin: 10.8 g/dL — ABNORMAL LOW (ref 12.0–15.0)
MCH: 34.5 pg — ABNORMAL HIGH (ref 26.0–34.0)
MCHC: 32.9 g/dL (ref 30.0–36.0)
MCV: 104.8 fL — ABNORMAL HIGH (ref 80.0–100.0)
Platelets: 272 10*3/uL (ref 150–400)
RBC: 3.13 MIL/uL — ABNORMAL LOW (ref 3.87–5.11)
RDW: 12.1 % (ref 11.5–15.5)
WBC: 8.6 10*3/uL (ref 4.0–10.5)
nRBC: 0 % (ref 0.0–0.2)

## 2020-06-05 LAB — BASIC METABOLIC PANEL
Anion gap: 12 (ref 5–15)
BUN: 43 mg/dL — ABNORMAL HIGH (ref 8–23)
CO2: 22 mmol/L (ref 22–32)
Calcium: 8.4 mg/dL — ABNORMAL LOW (ref 8.9–10.3)
Chloride: 105 mmol/L (ref 98–111)
Creatinine, Ser: 3.39 mg/dL — ABNORMAL HIGH (ref 0.44–1.00)
GFR, Estimated: 14 mL/min — ABNORMAL LOW (ref >60)
Glucose, Bld: 113 mg/dL — ABNORMAL HIGH (ref 70–99)
Potassium: 3.4 mmol/L — ABNORMAL LOW (ref 3.5–5.1)
Sodium: 139 mmol/L (ref 135–145)

## 2020-06-05 LAB — SURGICAL PATHOLOGY

## 2020-06-05 MED ORDER — PANTOPRAZOLE SODIUM 40 MG PO TBEC
40.0000 mg | DELAYED_RELEASE_TABLET | Freq: Every day | ORAL | Status: DC
  Administered 2020-06-05 – 2020-06-08 (×4): 40 mg via ORAL
  Filled 2020-06-05 (×4): qty 1

## 2020-06-05 MED ORDER — PROMETHAZINE HCL 25 MG/ML IJ SOLN
12.5000 mg | Freq: Four times a day (QID) | INTRAMUSCULAR | Status: DC | PRN
  Administered 2020-06-05 – 2020-06-07 (×3): 12.5 mg via INTRAVENOUS
  Filled 2020-06-05 (×3): qty 1

## 2020-06-05 MED ORDER — LIDOCAINE 5 % EX PTCH
1.0000 | MEDICATED_PATCH | Freq: Every day | CUTANEOUS | Status: DC
  Administered 2020-06-05 – 2020-06-06 (×2): 1 via TRANSDERMAL
  Filled 2020-06-05 (×4): qty 1

## 2020-06-05 MED ORDER — POTASSIUM CHLORIDE 20 MEQ/15ML (10%) PO SOLN
40.0000 meq | Freq: Once | ORAL | Status: AC
  Administered 2020-06-05: 40 meq via ORAL
  Filled 2020-06-05: qty 30

## 2020-06-05 MED ORDER — SODIUM CHLORIDE 0.9 % IV SOLN: INTRAVENOUS | Status: DC

## 2020-06-05 MED ORDER — OXYCODONE HCL 5 MG PO TABS
10.0000 mg | ORAL_TABLET | ORAL | Status: DC | PRN
  Administered 2020-06-05 – 2020-06-07 (×4): 10 mg via ORAL
  Filled 2020-06-05 (×4): qty 2

## 2020-06-05 MED ORDER — TAMSULOSIN HCL 0.4 MG PO CAPS
0.4000 mg | ORAL_CAPSULE | Freq: Two times a day (BID) | ORAL | Status: DC
  Administered 2020-06-05 – 2020-06-08 (×7): 0.4 mg via ORAL
  Filled 2020-06-05 (×7): qty 1

## 2020-06-05 MED ORDER — OXYCODONE HCL 5 MG PO TABS
5.0000 mg | ORAL_TABLET | ORAL | Status: DC | PRN
  Administered 2020-06-05: 5 mg via ORAL
  Filled 2020-06-05: qty 1

## 2020-06-05 MED ORDER — HYDROMORPHONE HCL 1 MG/ML IJ SOLN
0.5000 mg | INTRAMUSCULAR | Status: DC | PRN
  Administered 2020-06-05 – 2020-06-07 (×9): 1 mg via INTRAVENOUS
  Filled 2020-06-05 (×9): qty 1

## 2020-06-05 NOTE — Progress Notes (Signed)
PHARMACIST - PHYSICIAN COMMUNICATION  DR:   Cruzita Lederer  CONCERNING: IV to Oral Route Change Policy  RECOMMENDATION: This patient is receiving pantoprazole by the intravenous route.  Based on criteria approved by the Pharmacy and Therapeutics Committee, the intravenous medication(s) is/are being converted to the equivalent oral dose form(s).   DESCRIPTION: These criteria include:  The patient is eating (either orally or via tube) and/or has been taking other orally administered medications for a least 24 hours  The patient has no evidence of active gastrointestinal bleeding or impaired GI absorption (gastrectomy, short bowel, patient on TNA or NPO).  If you have questions about this conversion, please contact the [place], [facility] 06/05/2020 8:50 AM

## 2020-06-05 NOTE — Progress Notes (Signed)
PROGRESS NOTE  [name] KGM:[id number] DOB: 1951-04-06 DOA: 06/03/2020 PCP: Celene Squibb, MD   LOS: 1 day   Brief Narrative / Interim history: 69 year old female with PMH of metastatic breast cancer to bone, currently on treatment holiday at her request, has not received chemotherapy since 02/23/2020, follows with Dr. 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  Noted to have acute kidney injury complicating underlying CKD and CT scan of abdomen and pelvis done as outpatient showed bilateral hydronephrosis, creatinine up to 5.  Urology consulted and s/p cystoscopy, bilateral ureteral stent placement and bladder biopsy on 10/25.  Subjective / 24h Interval events: She is complaining of back pain, mostly on the left flank, new since yesterday.  Assessment & Plan: Principal Problem Acute kidney injury due to obstructive uropathy, on chronic kidney disease stage IIIa -Creatinine 1.2 in September 2021, progressively worsening since then, 5 on admission.  Urology consulted, she is status post cystoscopy, bilateral ureteral stent placement and bladder biopsy -Continue IV fluids, creatinine improved to 3.4 this morning -Now with back pain, monitor, appreciate urology follow-up -Anticipate discharge within 24 hours if creatinine continues to improve  Active Problems Metastatic breast cancer to the bone-follows as an outpatient  History of anal cancer, status post and colostomy-believed to be in remission  Essential hypertension-continue metoprolol  Macrocytic anemia-anemia of chronic disease, follow CBC, no bleeding  Anxiety/depression-continue  home medication  Scheduled Meds: . Chlorhexidine Gluconate Cloth  6 each Topical Daily  . heparin  5,000 Units Subcutaneous Q8H  . letrozole  2.5 mg Oral Daily  . lidocaine  1 patch Transdermal Daily  . metoprolol succinate  25 mg Oral BID  . pantoprazole  40 mg Oral Daily   Continuous Infusions: . sodium chloride 75 mL/hr at 06/05/20 0922   PRN Meds:.acetaminophen **OR** acetaminophen, clonazePAM, hydrALAZINE, ondansetron **OR** ondansetron (ZOFRAN) IV, oxyCODONE, traMADol, zolpidem  Diet Orders (From admission, onward)    Start     Ordered   06/03/20 2233  Diet regular Room service appropriate? Yes; Fluid consistency: Thin  Diet effective now       Question Answer Comment  Room service appropriate? Yes   Fluid consistency: Thin      06/03/20 2232          DVT prophylaxis: heparin injection 5,000 Units Start: 06/04/20 0600     Code Status: DNR  Family Communication: no family at bedside   Status is: Inpatient  Remains inpatient appropriate because:IV treatments appropriate due to intensity of illness or inability to take PO   Dispo: The patient is from: Home              Anticipated d/c is to: Home              Anticipated d/c date is: 1 day              Patient currently is not medically stable to d/c.  Consultants:  Urology   Procedures:  06/03/2020:  1. Cystoscopy,bilateral retrograde with interpretation 2. Bilateral ureteral stent placement 3. Bladder biopsy  Microbiology  None   Antimicrobials: None     Objective: Vitals:   06/04/20 1613 06/04/20 2004 06/05/20 0550 06/05/20 0943  BP: Marland Kitchen)  169/84 (!) 174/67 (!) 134/108 (!) 170/74  Pulse:  64 66 69  Resp:  16 18   Temp:  98.4 F (36.9 C) 97.8 F (36.6 C)   TempSrc:  Oral Oral   SpO2:  95% 97%   Weight:      Height:        Intake/Output Summary (Last 24 hours) at 06/05/2020 1102 Last data filed at 06/05/2020 0901 Gross per 24 hour  Intake 1135.37 ml  Output 2800 ml  Net -1664.63 ml    Filed Weights   06/03/20 1818  Weight: 59.9 kg    Examination:  Constitutional: NAD Eyes: no scleral icterus ENMT: Mucous membranes are moist.  Neck: normal, supple Respiratory: clear to auscultation bilaterally, no wheezing, no crackles. Normal respiratory effort.  Cardiovascular: Regular rate and rhythm, no murmurs / rubs / gallops. No LE edema. Good peripheral pulses Abdomen: non distended, no tenderness. Bowel sounds positive.  Musculoskeletal: no clubbing / cyanosis.  Skin: no rashes Neurologic: non focal   Data Reviewed: I have independently reviewed following labs and imaging studies   CBC: Recent Labs  Lab 06/03/20 1613 06/04/20 0823 06/05/20 0529  WBC 6.5 5.2 8.6  NEUTROABS 4.6  --   --   HGB 9.4* 10.4* 10.8*  HCT 28.9* 31.3* 32.8*  MCV 106.3* 104.3* 104.8*  PLT 232 253 703   Basic Metabolic Panel: Recent Labs  Lab 06/03/20 1613 06/04/20 0823 06/05/20 0529  NA 141 143 139  K 4.8 4.1 3.4*  CL 109 108 105  CO2 19* 21* 22  GLUCOSE 81 121* 113*  BUN 54* 48* 43*  CREATININE 5.03* 4.30* 3.39*  CALCIUM 8.1* 8.7* 8.4*   Liver Function Tests: Recent Labs  Lab 06/03/20 1613  AST 32  ALT 31  ALKPHOS 55  BILITOT 0.7  PROT 6.8  ALBUMIN 3.8   Coagulation Profile: No results for input(s): INR, PROTIME in the last 168 hours. HbA1C: No results for input(s): HGBA1C in the last 72 hours. CBG: No results for input(s): GLUCAP in the last 168 hours.  Recent Results (from the past 240 hour(s))  Urine culture     Status: Abnormal   Collection Time: 06/03/20  4:13 PM   Specimen: Urine, Random  Result Value Ref Range Status   Specimen Description   Final    URINE, RANDOM Performed at [place] [address], [place], [place] [address]    Special Requests   Final    NONE Performed at [facility], [place] [address], [place], [place] [address]    Culture (A)  Final    10,000 COLONIES/mL LACTOBACILLUS SPECIES  Standardized susceptibility testing for this organism is not available. Performed at [facility], [place] [address] [place], [place] [address]    Report Status 06/04/2020 FINAL  Final  Respiratory Panel by RT PCR (Flu A&B, Covid) - Nasopharyngeal Swab     Status: None   Collection Time: 06/03/20  4:13 PM   Specimen: Nasopharyngeal Swab  Result Value Ref Range Status   SARS Coronavirus 2 by RT PCR NEGATIVE NEGATIVE Final    Comment: (NOTE) SARS-CoV-2 target nucleic acids are NOT DETECTED.  The SARS-CoV-2 RNA is generally detectable in upper respiratoy specimens during the acute phase of infection. The lowest concentration of SARS-CoV-2 viral copies this assay can detect is 131 copies/mL. A negative result does not preclude SARS-Cov-2 infection and should not be used as the sole basis for treatment or other patient management decisions.  A negative result may occur with  improper specimen collection/handling, submission of specimen other than nasopharyngeal swab, presence of viral mutation(s) within the areas targeted by this assay, and inadequate number of viral copies (<131 copies/mL). A negative result must be combined with clinical observations, patient history, and epidemiological information. The expected result is Negative.  Fact Sheet for Patients:  PinkCheek.be  Fact Sheet for Healthcare Providers:  GravelBags.it  This test is no t yet approved or cleared by the [place] FDA and  has been authorized for detection and/or diagnosis of SARS-CoV-2 by FDA under an Emergency Use Authorization (EUA). This EUA will remain  in effect (meaning this test can be used) for the duration of the COVID-19 declaration under Section 564(b)(1) of the Act, 21 U.S.C. section 360bbb-3(b)(1), unless the authorization is terminated or revoked sooner.     Influenza A by PCR NEGATIVE NEGATIVE Final   Influenza B by PCR NEGATIVE  NEGATIVE Final    Comment: (NOTE) The Xpert Xpress SARS-CoV-2/FLU/RSV assay is intended as an aid in  the diagnosis of influenza from Nasopharyngeal swab specimens and  should not be used as a sole basis for treatment. Nasal washings and  aspirates are unacceptable for Xpert Xpress SARS-CoV-2/FLU/RSV  testing.  Fact Sheet for Patients: PinkCheek.be  Fact Sheet for Healthcare Providers: GravelBags.it  This test is not yet approved or cleared by the [place] FDA and  has been authorized for detection and/or diagnosis of SARS-CoV-2 by  FDA under an Emergency Use Authorization (EUA). This EUA will remain  in effect (meaning this test can be used) for the duration of the  Covid-19 declaration under Section 564(b)(1) of the Act, 21  U.S.C. section 360bbb-3(b)(1), unless the authorization is  terminated or revoked. Performed at [facility], [place] [address], [place], [place] [address]      Radiology Studies: No results found.  Marzetta Board, MD, PhD [facility]  Between 7 am - 7 pm I am available, please contact me via Amion or Securechat  Between 7 pm - 7 am I am not available, please contact night coverage MD/APP via Amion

## 2020-06-06 ENCOUNTER — Inpatient Hospital Stay (HOSPITAL_COMMUNITY): Payer: Medicare Other

## 2020-06-06 LAB — BASIC METABOLIC PANEL
Anion gap: 12 (ref 5–15)
BUN: 39 mg/dL — ABNORMAL HIGH (ref 8–23)
CO2: 20 mmol/L — ABNORMAL LOW (ref 22–32)
Calcium: 7.5 mg/dL — ABNORMAL LOW (ref 8.9–10.3)
Chloride: 107 mmol/L (ref 98–111)
Creatinine, Ser: 3.89 mg/dL — ABNORMAL HIGH (ref 0.44–1.00)
GFR, Estimated: 12 mL/min — ABNORMAL LOW (ref >60)
Glucose, Bld: 120 mg/dL — ABNORMAL HIGH (ref 70–99)
Potassium: 3.4 mmol/L — ABNORMAL LOW (ref 3.5–5.1)
Sodium: 139 mmol/L (ref 135–145)

## 2020-06-06 NOTE — Progress Notes (Signed)
PROGRESS NOTE  [name] UKG:[id number] DOB: 05/20/1951 DOA: 06/03/2020 PCP: Celene Squibb, MD   LOS: 2 days   Brief Narrative / Interim history: 69 year old female with PMH of metastatic breast cancer to bone, currently on treatment holiday at her request, has not received chemotherapy since 02/23/2020, follows with Dr. 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  Noted to have acute kidney injury complicating underlying CKD and CT scan of abdomen and pelvis done as outpatient showed bilateral hydronephrosis, creatinine up to 5.  Urology consulted and s/p cystoscopy, bilateral ureteral stent placement and bladder biopsy on 10/25.  Subjective / 24h Interval events: Left flank pain seems to be doing pretty bad, received IV Dilaudid and oxycodone and currently rating a 7 out of 10 despite medications  Assessment & Plan: Principal Problem Acute kidney injury due to obstructive uropathy, on chronic kidney disease stage IIIa -Creatinine 1.2 in September 2021, progressively worsening since then, 5 on admission.  Urology consulted, she is status post cystoscopy, bilateral ureteral stent placement and bladder biopsy -Continue IV fluids, creatinine initially improved to 3.4 on 10/27 however climbed back up again, along with increasing left flank pain.  Renal ultrasound somewhat similar to the CT scan.  Discussed with Dr. Louis Meckel, appreciate follow-up  Active Problems Metastatic breast cancer to the bone-follows as an outpatient  History of anal cancer, status post and colostomy-believed to be in remission  Essential hypertension-continue metoprolol,  she has been persistently hypertensive due to pain  Macrocytic anemia-anemia of chronic disease, follow CBC, no bleeding  Anxiety/depression-continue home medication  Scheduled Meds: . Chlorhexidine Gluconate Cloth  6 each Topical Daily  . heparin  5,000 Units Subcutaneous Q8H  . letrozole  2.5 mg Oral Daily  . lidocaine  1 patch Transdermal Daily  . metoprolol succinate  25 mg Oral BID  . pantoprazole  40 mg Oral Daily  . tamsulosin  0.4 mg Oral BID   Continuous Infusions: . sodium chloride 75 mL/hr at 06/06/20 0554   PRN Meds:.acetaminophen **OR** acetaminophen, clonazePAM, hydrALAZINE, HYDROmorphone (DILAUDID) injection, ondansetron **OR** ondansetron (ZOFRAN) IV, oxyCODONE, promethazine, traMADol, zolpidem  Diet Orders (From admission, onward)    Start     Ordered   06/03/20 2233  Diet regular Room service appropriate? Yes; Fluid consistency: Thin  Diet effective now       Question Answer Comment  Room service appropriate? Yes   Fluid consistency: Thin      06/03/20 2232          DVT prophylaxis: heparin injection 5,000 Units Start: 06/04/20 0600     Code Status: DNR  Family Communication: no family at bedside   Status is: Inpatient  Remains inpatient appropriate because:IV treatments appropriate due to intensity of illness or inability to take PO   Dispo: The patient is from: Home              Anticipated d/c is to: Home              Anticipated d/c date is: 1 day              Patient currently is not medically stable to d/c.  Consultants:  Urology   Procedures:  06/03/2020:  1. Cystoscopy,bilateral retrograde with interpretation 2. Bilateral ureteral stent placement 3. Bladder biopsy  Microbiology  None   Antimicrobials: None     Objective: Vitals:   06/05/20 1325 06/05/20 1338 06/05/20 1958 06/06/20 0545  BP: (!) 183/90 (!) 182/83 (!) 194/94 (!) 159/76  Pulse: 62 60 63 81  Resp: 17  16 16   Temp: 98.3 F (36.8 C)  97.6 F (36.4 C)  98.5 F (36.9 C)  TempSrc: Oral  Oral Oral  SpO2: 92% 97% 97% 95%  Weight:      Height:        Intake/Output Summary (Last 24 hours) at 06/06/2020 1150 Last data filed at 06/06/2020 0549 Gross per 24 hour  Intake 2342.5 ml  Output 2150 ml  Net 192.5 ml   Filed Weights   06/03/20 1818  Weight: 59.9 kg    Examination:  Constitutional: Appears comfortable at rest but grimacing with movement Eyes: No scleral icterus ENMT: mmm Neck: normal, supple Respiratory: Clear to auscultation, no crackles, no wheezing Cardiovascular: Regular rate and rhythm, no murmurs, no edema Abdomen: Bowel sounds positive, nondistended Musculoskeletal: no clubbing / cyanosis.  Skin: No rashes seen Neurologic: No focal deficits  Data Reviewed: I have independently reviewed following labs and imaging studies   CBC: Recent Labs  Lab 06/03/20 1613 06/04/20 0823 06/05/20 0529  WBC 6.5 5.2 8.6  NEUTROABS 4.6  --   --   HGB 9.4* 10.4* 10.8*  HCT 28.9* 31.3* 32.8*  MCV 106.3* 104.3* 104.8*  PLT 232 253 630   Basic Metabolic Panel: Recent Labs  Lab 06/03/20 1613 06/04/20 0823 06/05/20 0529 06/06/20 0559  NA 141 143 139 139  K 4.8 4.1 3.4* 3.4*  CL 109 108 105 107  CO2 19* 21* 22 20*  GLUCOSE 81 121* 113* 120*  BUN 54* 48* 43* 39*  CREATININE 5.03* 4.30* 3.39* 3.89*  CALCIUM 8.1* 8.7* 8.4* 7.5*   Liver Function Tests: Recent Labs  Lab 06/03/20 1613  AST 32  ALT 31  ALKPHOS 55  BILITOT 0.7  PROT 6.8  ALBUMIN 3.8   Coagulation Profile: No results for input(s): INR, PROTIME in the last 168 hours. HbA1C: No results for input(s): HGBA1C in the last 72 hours. CBG: No results for input(s): GLUCAP in the last 168 hours.  Recent Results (from the past 240 hour(s))  Urine culture     Status: Abnormal   Collection Time: 06/03/20  4:13 PM   Specimen: Urine, Random  Result Value Ref Range Status   Specimen Description   Final    URINE, RANDOM Performed at [place] [address], [place], [place] [address]    Special Requests   Final    NONE Performed at [facility], [place] [address], [place], [place] [address]    Culture (A)  Final    10,000 COLONIES/mL LACTOBACILLUS SPECIES Standardized susceptibility testing for this organism is not available. Performed at [facility], [place] [address] [place], [place] [address]    Report Status 06/04/2020 FINAL  Final  Respiratory Panel by RT PCR (Flu A&B, Covid) - Nasopharyngeal Swab     Status: None   Collection Time: 06/03/20  4:13 PM   Specimen: Nasopharyngeal Swab  Result Value Ref Range Status   SARS Coronavirus 2 by RT PCR NEGATIVE NEGATIVE Final    Comment: (NOTE) SARS-CoV-2 target nucleic acids are NOT DETECTED.  The SARS-CoV-2 RNA is generally detectable in upper respiratoy specimens during the acute phase  of infection. The lowest concentration of SARS-CoV-2 viral copies this assay can detect is 131 copies/mL. A negative result does not preclude SARS-Cov-2 infection and should not be used as the sole basis for treatment or other patient management decisions. A negative result may occur with  improper specimen collection/handling, submission of specimen other than nasopharyngeal swab, presence of viral mutation(s) within the areas targeted by this assay, and inadequate number of viral copies (<131 copies/mL). A negative result must be combined with clinical observations, patient history, and epidemiological information. The expected result is Negative.  Fact Sheet for Patients:  PinkCheek.be  Fact Sheet for Healthcare Providers:  GravelBags.it  This test is no t yet approved or cleared by the [place] FDA and  has been authorized for detection and/or diagnosis of SARS-CoV-2 by FDA under an Emergency Use Authorization (EUA). This EUA will remain  in effect (meaning this test can be used) for  the duration of the COVID-19 declaration under Section 564(b)(1) of the Act, 21 U.S.C. section 360bbb-3(b)(1), unless the authorization is terminated or revoked sooner.     Influenza A by PCR NEGATIVE NEGATIVE Final   Influenza B by PCR NEGATIVE NEGATIVE Final    Comment: (NOTE) The Xpert Xpress SARS-CoV-2/FLU/RSV assay is intended as an aid in  the diagnosis of influenza from Nasopharyngeal swab specimens and  should not be used as a sole basis for treatment. Nasal washings and  aspirates are unacceptable for Xpert Xpress SARS-CoV-2/FLU/RSV  testing.  Fact Sheet for Patients: PinkCheek.be  Fact Sheet for Healthcare Providers: GravelBags.it  This test is not yet approved or cleared by the [place] FDA and  has been authorized for detection and/or diagnosis of SARS-CoV-2 by  FDA under an Emergency Use Authorization (EUA). This EUA will remain  in effect (meaning this test can be used) for the duration of the  Covid-19 declaration under Section 564(b)(1) of the Act, 21  U.S.C. section 360bbb-3(b)(1), unless the authorization is  terminated or revoked. Performed at [facility], [place] [address], [place], [place] [address]      Radiology Studies: US RENAL  Result Date: 06/05/2020 CLINICAL DATA:  Left-sided flank pain status post stent placement EXAM: RENAL / URINARY TRACT ULTRASOUND COMPLETE COMPARISON:  CT 06/03/2020 FINDINGS: Right Kidney: Renal measurements: 10.2 x 4.9 x 6 cm = volume: 157 mL. Echogenicity within normal limits. Mild right hydronephrosis, likely decreased as compared with prior CT. No mass. Left Kidney: Renal measurements: 11.5 x 6.3 x 6.2 cm = volume: 235 mL. Echogenicity within normal limits. No mass. Mild left hydronephrosis, grossly unchanged compared with CT. Bladder: Nonvisualized, bladder appears empty. Other: None. IMPRESSION: 1. Mild bilateral hydronephrosis, left greater than  right. Hydronephrosis likely improved on the right and unchanged on the left as compared with CT from 06/03/2020. 2. Reported urinary tract stents are not demonstrated on today's ultrasound. Electronically Signed   By: Donavan Foil M.D.   On: 06/05/2020 16:14    Marzetta Board, MD, PhD [facility]  Between 7 am - 7 pm I am available, please contact me via Amion or Securechat  Between 7 pm - 7 am I am not available, please contact night coverage MD/APP via Amion

## 2020-06-06 NOTE — Consult Note (Signed)
Following this patient for bilateral hydroureteronephrosis secondary to extrinsic compression, likely secondary to malignancy.  The patient underwent bilateral stent placement 2 and half days ago.  Midway through the postop day 1 the patient had increase in her left-sided flank pain.  This progressed to the point where she was requiring IV narcotics.  She was describing it as a stabbing feeling.  It subsequently improved and now it is more of a soreness/ache with intermittent cramping.  She still is requiring fairly high levels of narcotics.  Because of the patient's pain we ordered a ultrasound.  The ultrasound demonstrated some mild hydro on the right and moderate hydro on the left.  They appeared to be slightly better than the CT scan that was performed 2 days prior.  However, her pain was such that I recommended she have a CT scan.  The CT scan showed worsening hydro on the left side, with stranding around the kidney and in the retroperitoneum.  The stents appear to be appropriately positioned.  Currently the patient is resting comfortably in bed.  Today's Vitals   06/06/20 0929 06/06/20 0959 06/06/20 1334 06/06/20 1434  BP:   (!) 135/59   Pulse:   79   Resp:   20   Temp:   98.6 F (37 C)   TempSrc:   Oral   SpO2:   98%   Weight:      Height:      PainSc: 2  2   1     Body mass index is 21.97 kg/m. NAD  Intake/Output Summary (Last 24 hours) at 06/06/2020 1824 Last data filed at 06/06/2020 0549 Gross per 24 hour  Intake 675 ml  Output 400 ml  Net 275 ml  Non-labored breathing Left CVA tenderness flank tenderness Foley with straw-colored urine Extremities are symmetric  Recent Labs    06/04/20 0823 06/05/20 0529  WBC 5.2 8.6  HGB 10.4* 10.8*  HCT 31.3* 32.8*   Recent Labs    06/04/20 0823 06/05/20 0529 06/06/20 0559  NA 143 139 139  K 4.1 3.4* 3.4*  CL 108 105 107  CO2 21* 22 20*  GLUCOSE 121* 113* 120*  BUN 48* 43* 39*  CREATININE 4.30* 3.39* 3.89*  CALCIUM  8.7* 8.4* 7.5*   No results for input(s): LABPT, INR in the last 72 hours. No results for input(s): PSA in the last 72 hours. No results for input(s): LABURIN in the last 72 hours. Results for orders placed or performed during the hospital encounter of 06/03/20  Urine culture     Status: Abnormal   Collection Time: 06/03/20  4:13 PM   Specimen: Urine, Random  Result Value Ref Range Status   Specimen Description   Final    URINE, RANDOM Performed at [place] [address], [place], [place] [address]    Special Requests   Final    NONE Performed at [facility], [place] [address], [place], [place] [address]    Culture (A)  Final    10,000 COLONIES/mL LACTOBACILLUS SPECIES Standardized susceptibility testing for this organism is not available. Performed at [facility], [place] [address], [place], [place] [address]    Report Status 06/04/2020 FINAL  Final  Respiratory Panel by RT PCR (Flu A&B, Covid) - Nasopharyngeal Swab     Status: None   Collection Time: 06/03/20  4:13 PM   Specimen: Nasopharyngeal Swab  Result Value Ref Range Status   SARS Coronavirus 2 by RT PCR  NEGATIVE NEGATIVE Final    Comment: (NOTE) SARS-CoV-2 target nucleic acids are NOT DETECTED.  The SARS-CoV-2 RNA is generally detectable in upper respiratoy specimens during the acute phase of infection. The lowest concentration of SARS-CoV-2 viral copies this assay can detect is 131 copies/mL. A negative result does not preclude SARS-Cov-2 infection and should not be used as the sole basis for treatment or other patient management decisions. A negative result may occur with  improper specimen collection/handling, submission of specimen other than nasopharyngeal swab, presence of viral mutation(s) within the areas targeted by this assay, and inadequate number of viral copies (<131 copies/mL). A negative result must be combined with clinical observations, patient history,  and epidemiological information. The expected result is Negative.  Fact Sheet for Patients:  PinkCheek.be  Fact Sheet for Healthcare Providers:  GravelBags.it  This test is no t yet approved or cleared by the [place] FDA and  has been authorized for detection and/or diagnosis of SARS-CoV-2 by FDA under an Emergency Use Authorization (EUA). This EUA will remain  in effect (meaning this test can be used) for the duration of the COVID-19 declaration under Section 564(b)(1) of the Act, 21 U.S.C. section 360bbb-3(b)(1), unless the authorization is terminated or revoked sooner.     Influenza A by PCR NEGATIVE NEGATIVE Final   Influenza B by PCR NEGATIVE NEGATIVE Final    Comment: (NOTE) The Xpert Xpress SARS-CoV-2/FLU/RSV assay is intended as an aid in  the diagnosis of influenza from Nasopharyngeal swab specimens and  should not be used as a sole basis for treatment. Nasal washings and  aspirates are unacceptable for Xpert Xpress SARS-CoV-2/FLU/RSV  testing.  Fact Sheet for Patients: PinkCheek.be  Fact Sheet for Healthcare Providers: GravelBags.it  This test is not yet approved or cleared by the [place] FDA and  has been authorized for detection and/or diagnosis of SARS-CoV-2 by  FDA under an Emergency Use Authorization (EUA). This EUA will remain  in effect (meaning this test can be used) for the duration of the  Covid-19 declaration under Section 564(b)(1) of the Act, 21  U.S.C. section 360bbb-3(b)(1), unless the authorization is  terminated or revoked. Performed at [facility], [place] [address], [place], [place] [address]     CT scan: I have independently reviewed the patient's CAT scan with the above findings. Ultrasound: I reviewed the patient's ultrasound from yesterday with the above findings.  Impression: I think that the  left stent is failed.  This may be secondary to worsening disease, may be a mechanical failure as well.  Recommendation: I spoke with the patient in regards to the CT scan and the potential options.  Ultimately, but we both decided that it would be better for her to have the stent exchanged/upsized.  We will plan to do that tomorrow.  She is scheduled for 11:30 AM.  I have made her n.p.o. past midnight.

## 2020-06-06 NOTE — H&P (View-Only) (Signed)
Following this patient for bilateral hydroureteronephrosis secondary to extrinsic compression, likely secondary to malignancy.  The patient underwent bilateral stent placement 2 and half days ago.  Midway through the postop day 1 the patient had increase in her left-sided flank pain.  This progressed to the point where she was requiring IV narcotics.  She was describing it as a stabbing feeling.  It subsequently improved and now it is more of a soreness/ache with intermittent cramping.  She still is requiring fairly high levels of narcotics.  Because of the patient's pain we ordered a ultrasound.  The ultrasound demonstrated some mild hydro on the right and moderate hydro on the left.  They appeared to be slightly better than the CT scan that was performed 2 days prior.  However, her pain was such that I recommended she have a CT scan.  The CT scan showed worsening hydro on the left side, with stranding around the kidney and in the retroperitoneum.  The stents appear to be appropriately positioned.  Currently the patient is resting comfortably in bed.  Today's Vitals   06/06/20 0929 06/06/20 0959 06/06/20 1334 06/06/20 1434  BP:   (!) 135/59   Pulse:   79   Resp:   20   Temp:   98.6 F (37 C)   TempSrc:   Oral   SpO2:   98%   Weight:      Height:      PainSc: 2  2   1     Body mass index is 21.97 kg/m. NAD  Intake/Output Summary (Last 24 hours) at 06/06/2020 1824 Last data filed at 06/06/2020 0549 Gross per 24 hour  Intake 675 ml  Output 400 ml  Net 275 ml  Non-labored breathing Left CVA tenderness flank tenderness Foley with straw-colored urine Extremities are symmetric  Recent Labs    06/04/20 0823 06/05/20 0529  WBC 5.2 8.6  HGB 10.4* 10.8*  HCT 31.3* 32.8*   Recent Labs    06/04/20 0823 06/05/20 0529 06/06/20 0559  NA 143 139 139  K 4.1 3.4* 3.4*  CL 108 105 107  CO2 21* 22 20*  GLUCOSE 121* 113* 120*  BUN 48* 43* 39*  CREATININE 4.30* 3.39* 3.89*  CALCIUM  8.7* 8.4* 7.5*   No results for input(s): LABPT, INR in the last 72 hours. No results for input(s): PSA in the last 72 hours. No results for input(s): LABURIN in the last 72 hours. Results for orders placed or performed during the hospital encounter of 06/03/20  Urine culture     Status: Abnormal   Collection Time: 06/03/20  4:13 PM   Specimen: Urine, Random  Result Value Ref Range Status   Specimen Description   Final    URINE, RANDOM Performed at [place] [address], [place], [place] [address]    Special Requests   Final    NONE Performed at [facility], [place] [address], [place], [place] [address]    Culture (A)  Final    10,000 COLONIES/mL LACTOBACILLUS SPECIES Standardized susceptibility testing for this organism is not available. Performed at [facility], [place] [address], [place], [place] [address]    Report Status 06/04/2020 FINAL  Final  Respiratory Panel by RT PCR (Flu A&B, Covid) - Nasopharyngeal Swab     Status: None   Collection Time: 06/03/20  4:13 PM   Specimen: Nasopharyngeal Swab  Result Value Ref Range Status   SARS Coronavirus 2 by RT PCR  NEGATIVE NEGATIVE Final    Comment: (NOTE) SARS-CoV-2 target nucleic acids are NOT DETECTED.  The SARS-CoV-2 RNA is generally detectable in upper respiratoy specimens during the acute phase of infection. The lowest concentration of SARS-CoV-2 viral copies this assay can detect is 131 copies/mL. A negative result does not preclude SARS-Cov-2 infection and should not be used as the sole basis for treatment or other patient management decisions. A negative result may occur with  improper specimen collection/handling, submission of specimen other than nasopharyngeal swab, presence of viral mutation(s) within the areas targeted by this assay, and inadequate number of viral copies (<131 copies/mL). A negative result must be combined with clinical observations, patient history,  and epidemiological information. The expected result is Negative.  Fact Sheet for Patients:  PinkCheek.be  Fact Sheet for Healthcare Providers:  GravelBags.it  This test is no t yet approved or cleared by the [place] FDA and  has been authorized for detection and/or diagnosis of SARS-CoV-2 by FDA under an Emergency Use Authorization (EUA). This EUA will remain  in effect (meaning this test can be used) for the duration of the COVID-19 declaration under Section 564(b)(1) of the Act, 21 U.S.C. section 360bbb-3(b)(1), unless the authorization is terminated or revoked sooner.     Influenza A by PCR NEGATIVE NEGATIVE Final   Influenza B by PCR NEGATIVE NEGATIVE Final    Comment: (NOTE) The Xpert Xpress SARS-CoV-2/FLU/RSV assay is intended as an aid in  the diagnosis of influenza from Nasopharyngeal swab specimens and  should not be used as a sole basis for treatment. Nasal washings and  aspirates are unacceptable for Xpert Xpress SARS-CoV-2/FLU/RSV  testing.  Fact Sheet for Patients: PinkCheek.be  Fact Sheet for Healthcare Providers: GravelBags.it  This test is not yet approved or cleared by the [place] FDA and  has been authorized for detection and/or diagnosis of SARS-CoV-2 by  FDA under an Emergency Use Authorization (EUA). This EUA will remain  in effect (meaning this test can be used) for the duration of the  Covid-19 declaration under Section 564(b)(1) of the Act, 21  U.S.C. section 360bbb-3(b)(1), unless the authorization is  terminated or revoked. Performed at [facility], [place] [address], [place], [place] [address]     CT scan: I have independently reviewed the patient's CAT scan with the above findings. Ultrasound: I reviewed the patient's ultrasound from yesterday with the above findings.  Impression: I think that the  left stent is failed.  This may be secondary to worsening disease, may be a mechanical failure as well.  Recommendation: I spoke with the patient in regards to the CT scan and the potential options.  Ultimately, but we both decided that it would be better for her to have the stent exchanged/upsized.  We will plan to do that tomorrow.  She is scheduled for 11:30 AM.  I have made her n.p.o. past midnight.

## 2020-06-07 ENCOUNTER — Encounter (HOSPITAL_COMMUNITY)
Admission: EM | Disposition: A | Discharge status: Home / Self Care | Payer: Self-pay | Source: Home / Self Care | Attending: Internal Medicine

## 2020-06-07 ENCOUNTER — Inpatient Hospital Stay (HOSPITAL_COMMUNITY): Payer: Medicare Other | Admitting: Anesthesiology

## 2020-06-07 ENCOUNTER — Encounter (HOSPITAL_COMMUNITY): Payer: Self-pay | Admitting: Internal Medicine

## 2020-06-07 ENCOUNTER — Inpatient Hospital Stay (HOSPITAL_COMMUNITY): Payer: Medicare Other

## 2020-06-07 HISTORY — PX: CYSTOSCOPY WITH STENT PLACEMENT: SHX5790

## 2020-06-07 LAB — BASIC METABOLIC PANEL
Anion gap: 11 (ref 5–15)
BUN: 37 mg/dL — ABNORMAL HIGH (ref 8–23)
CO2: 19 mmol/L — ABNORMAL LOW (ref 22–32)
Calcium: 6.9 mg/dL — ABNORMAL LOW (ref 8.9–10.3)
Chloride: 106 mmol/L (ref 98–111)
Creatinine, Ser: 3.74 mg/dL — ABNORMAL HIGH (ref 0.44–1.00)
GFR, Estimated: 13 mL/min — ABNORMAL LOW (ref >60)
Glucose, Bld: 96 mg/dL (ref 70–99)
Potassium: 3.2 mmol/L — ABNORMAL LOW (ref 3.5–5.1)
Sodium: 136 mmol/L (ref 135–145)

## 2020-06-07 LAB — CBC
HCT: 27.1 % — ABNORMAL LOW (ref 36.0–46.0)
Hemoglobin: 9.1 g/dL — ABNORMAL LOW (ref 12.0–15.0)
MCH: 34.7 pg — ABNORMAL HIGH (ref 26.0–34.0)
MCHC: 33.6 g/dL (ref 30.0–36.0)
MCV: 103.4 fL — ABNORMAL HIGH (ref 80.0–100.0)
Platelets: 228 10*3/uL (ref 150–400)
RBC: 2.62 MIL/uL — ABNORMAL LOW (ref 3.87–5.11)
RDW: 12.2 % (ref 11.5–15.5)
WBC: 12.7 10*3/uL — ABNORMAL HIGH (ref 4.0–10.5)
nRBC: 0 % (ref 0.0–0.2)

## 2020-06-07 LAB — SURGICAL PCR SCREEN
MRSA, PCR: NEGATIVE
Staphylococcus aureus: POSITIVE — AB

## 2020-06-07 SURGERY — CYSTOSCOPY, WITH STENT INSERTION
Anesthesia: General | Site: Urethra | Laterality: Left

## 2020-06-07 MED ORDER — SODIUM CHLORIDE 0.9 % IV SOLN: INTRAVENOUS | Status: DC

## 2020-06-07 MED ORDER — PROPOFOL 10 MG/ML IV BOLUS
INTRAVENOUS | Status: DC | PRN
  Administered 2020-06-07: 160 mg via INTRAVENOUS

## 2020-06-07 MED ORDER — ONDANSETRON HCL 4 MG/2ML IJ SOLN
INTRAMUSCULAR | Status: DC | PRN
  Administered 2020-06-07: 4 mg via INTRAVENOUS

## 2020-06-07 MED ORDER — LIDOCAINE 2% (20 MG/ML) 5 ML SYRINGE
INTRAMUSCULAR | Status: DC | PRN
  Administered 2020-06-07: 80 mg via INTRAVENOUS

## 2020-06-07 MED ORDER — MUPIROCIN 2 % EX OINT
1.0000 "application " | TOPICAL_OINTMENT | Freq: Two times a day (BID) | CUTANEOUS | Status: DC
  Administered 2020-06-07 – 2020-06-08 (×3): 1 via NASAL
  Filled 2020-06-07: qty 22

## 2020-06-07 MED ORDER — CEFAZOLIN SODIUM-DEXTROSE 2-3 GM-%(50ML) IV SOLR
INTRAVENOUS | Status: DC | PRN
  Administered 2020-06-07: 2 g via INTRAVENOUS

## 2020-06-07 MED ORDER — DEXAMETHASONE SODIUM PHOSPHATE 10 MG/ML IJ SOLN
INTRAMUSCULAR | Status: AC
  Filled 2020-06-07: qty 1

## 2020-06-07 MED ORDER — DEXAMETHASONE SODIUM PHOSPHATE 10 MG/ML IJ SOLN
INTRAMUSCULAR | Status: DC | PRN
  Administered 2020-06-07: 10 mg via INTRAVENOUS

## 2020-06-07 MED ORDER — ONDANSETRON HCL 4 MG/2ML IJ SOLN
INTRAMUSCULAR | Status: AC
  Filled 2020-06-07: qty 2

## 2020-06-07 MED ORDER — MIDAZOLAM HCL 5 MG/5ML IJ SOLN
INTRAMUSCULAR | Status: DC | PRN
  Administered 2020-06-07: 2 mg via INTRAVENOUS

## 2020-06-07 MED ORDER — LIDOCAINE 2% (20 MG/ML) 5 ML SYRINGE
INTRAMUSCULAR | Status: AC
  Filled 2020-06-07: qty 5

## 2020-06-07 MED ORDER — ONDANSETRON HCL 4 MG/2ML IJ SOLN: 4.0000 mg | Freq: Once | INTRAMUSCULAR | Status: DC | PRN

## 2020-06-07 MED ORDER — CEFAZOLIN SODIUM-DEXTROSE 2-4 GM/100ML-% IV SOLN
INTRAVENOUS | Status: AC
  Filled 2020-06-07: qty 100

## 2020-06-07 MED ORDER — OXYCODONE HCL 5 MG PO TABS: 5.0000 mg | ORAL_TABLET | Freq: Once | ORAL | Status: DC | PRN

## 2020-06-07 MED ORDER — OXYCODONE HCL 5 MG/5ML PO SOLN: 5.0000 mg | Freq: Once | ORAL | Status: DC | PRN

## 2020-06-07 MED ORDER — FENTANYL CITRATE (PF) 100 MCG/2ML IJ SOLN: 25.0000 ug | INTRAMUSCULAR | Status: DC | PRN

## 2020-06-07 MED ORDER — SODIUM CHLORIDE 0.9 % IR SOLN
Status: DC | PRN
  Administered 2020-06-07: 2000 mL

## 2020-06-07 MED ORDER — FENTANYL CITRATE (PF) 100 MCG/2ML IJ SOLN
INTRAMUSCULAR | Status: DC | PRN
  Administered 2020-06-07 (×2): 25 ug via INTRAVENOUS
  Administered 2020-06-07: 50 ug via INTRAVENOUS

## 2020-06-07 MED ORDER — FENTANYL CITRATE (PF) 100 MCG/2ML IJ SOLN
INTRAMUSCULAR | Status: AC
  Filled 2020-06-07: qty 2

## 2020-06-07 MED ORDER — POTASSIUM CHLORIDE CRYS ER 20 MEQ PO TBCR
40.0000 meq | EXTENDED_RELEASE_TABLET | ORAL | Status: AC
  Administered 2020-06-07: 40 meq via ORAL
  Filled 2020-06-07: qty 2

## 2020-06-07 MED ORDER — CHLORHEXIDINE GLUCONATE CLOTH 2 % EX PADS: 6.0000 | MEDICATED_PAD | Freq: Every day | CUTANEOUS | Status: DC

## 2020-06-07 MED ORDER — LACTATED RINGERS IV SOLN: INTRAVENOUS | Status: DC

## 2020-06-07 MED ORDER — IOHEXOL 300 MG/ML  SOLN
INTRAMUSCULAR | Status: DC | PRN
  Administered 2020-06-07: 8 mL

## 2020-06-07 MED ORDER — PROPOFOL 10 MG/ML IV BOLUS
INTRAVENOUS | Status: AC
  Filled 2020-06-07: qty 20

## 2020-06-07 MED ORDER — MIDAZOLAM HCL 2 MG/2ML IJ SOLN
INTRAMUSCULAR | Status: AC
  Filled 2020-06-07: qty 2

## 2020-06-07 SURGICAL SUPPLY — 11 items
BAG URO CATCHER STRL LF (MISCELLANEOUS) ×2 IMPLANT
CATH URET 5FR 28IN OPEN ENDED (CATHETERS) ×2 IMPLANT
CLOTH BEACON ORANGE TIMEOUT ST (SAFETY) ×2 IMPLANT
GLOVE BIOGEL M STRL SZ7.5 (GLOVE) ×2 IMPLANT
GOWN STRL REUS W/TWL XL LVL3 (GOWN DISPOSABLE) ×2 IMPLANT
GUIDEWIRE STR DUAL SENSOR (WIRE) ×2 IMPLANT
KIT TURNOVER KIT A (KITS) IMPLANT
MANIFOLD NEPTUNE II (INSTRUMENTS) ×2 IMPLANT
PACK CYSTO (CUSTOM PROCEDURE TRAY) ×2 IMPLANT
STENT CONTOUR 7FRX24 (STENTS) ×1 IMPLANT
TUBING CONNECTING 10 (TUBING) IMPLANT

## 2020-06-07 NOTE — Anesthesia Preprocedure Evaluation (Addendum)
Anesthesia Evaluation  Patient identified by MRN, date of birth, ID band Patient awake    Reviewed: Allergy & Precautions, NPO status , Patient's Chart, lab work & pertinent test results  History of Anesthesia Complications Negative for: history of anesthetic complications  Airway Mallampati: II  TM Distance: >3 FB Neck ROM: Full    Dental  (+) Dental Advisory Given, Teeth Intact   Pulmonary neg pulmonary ROS,    Pulmonary exam normal        Cardiovascular hypertension, Pt. on medications and Pt. on home beta blockers + CAD  Normal cardiovascular exam   '20 TTE - EF 60-65%. Doppler parameters are consistent with pseudonormalization. Trivial AI    Neuro/Psych PSYCHIATRIC DISORDERS Depression negative neurological ROS     GI/Hepatic GERD  Medicated and Controlled,(+)     substance abuse  marijuana use,   Endo/Other   Hypokalemia, K 3.2 Hypocalcemia, Ca 6.9   Renal/GU Renal InsufficiencyRenal disease     Musculoskeletal  Bone metastases    Abdominal   Peds  Hematology  (+) anemia ,  Lymphedema post mastectomy    Anesthesia Other Findings   Reproductive/Obstetrics  Breast cancer hx                              Anesthesia Physical Anesthesia Plan  ASA: III  Anesthesia Plan: General   Post-op Pain Management:    Induction: Intravenous  PONV Risk Score and Plan: 3 and Treatment may vary due to age or medical condition, Ondansetron and Dexamethasone  Airway Management Planned: LMA  Additional Equipment: None  Intra-op Plan:   Post-operative Plan: Extubation in OR  Informed Consent: I have reviewed the patients History and Physical, chart, labs and discussed the procedure including the risks, benefits and alternatives for the proposed anesthesia with the patient or authorized representative who has indicated his/her understanding and acceptance.   Patient has DNR.   Discussed DNR with patient and Suspend DNR.   Dental advisory given  Plan Discussed with: CRNA and Anesthesiologist  Anesthesia Plan Comments:        Anesthesia Quick Evaluation

## 2020-06-07 NOTE — Anesthesia Postprocedure Evaluation (Signed)
Anesthesia Post Note  Patient: [name]  Procedure(s) Performed: CYSTOSCOPY LEFT STENT EXCHANGE, retrograde pylogram (Left Urethra)     Patient location during evaluation: PACU Anesthesia Type: General Level of consciousness: awake and alert Pain management: pain level controlled Vital Signs Assessment: post-procedure vital signs reviewed and stable Respiratory status: spontaneous breathing, nonlabored ventilation, respiratory function stable and patient connected to nasal cannula oxygen Cardiovascular status: blood pressure returned to baseline and stable Postop Assessment: no apparent nausea or vomiting Anesthetic complications: no   No complications documented.  Last Vitals:  Vitals:   06/07/20 1115 06/07/20 1130  BP: 132/68 (!) 131/59  Pulse: 79 71  Resp: 10 16  Temp:  37 C  SpO2: 96% 98%    Last Pain:  Vitals:   06/07/20 1115  TempSrc:   PainSc: 0-No pain                 Audry Pili

## 2020-06-07 NOTE — Progress Notes (Signed)
PROGRESS NOTE  [name] ION:[id number] DOB: 1950-10-14 DOA: 06/03/2020 PCP: Celene Squibb, MD   LOS: 3 days   Brief Narrative / Interim history: 69 year old female with PMH of metastatic breast cancer to bone, currently on treatment holiday at her request, has not received chemotherapy since 02/23/2020, follows with Dr. 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  Noted to have acute kidney injury complicating underlying CKD and CT scan of abdomen and pelvis done as outpatient showed bilateral hydronephrosis, creatinine up to 5.  Urology consulted and s/p cystoscopy, bilateral ureteral stent placement and bladder biopsy on 10/25.  Subjective / 24h Interval events: Continues to require intravenous Dilaudid for left flank pain overnight.  Scheduled for repeat surgery this morning.  Otherwise feels well  Assessment & Plan: Principal Problem Acute kidney injury due to obstructive uropathy, on chronic kidney disease stage IIIa -Creatinine 1.2 in September 2021, progressively worsening since then, 5 on admission.  Urology consulted, she is status post cystoscopy, bilateral ureteral stent placement and bladder biopsy -Postop course complicated by worsening left flank pain, underwent repeat imaging which showed persistent left-sided hydronephrosis, concerning for stent malfunction, she will be taken to the OR again today by Dr. Louis Meckel -Creatinine hovering in the 3 range, overall stable and improving  Active Problems Metastatic breast cancer to the bone-follows as an outpatient  History of anal cancer, status post and colostomy-believed to be in  remission  Essential hypertension-continue metoprolol, overall better, she has been on the high side due to pain  Macrocytic anemia-anemia of chronic disease, follow CBC, no bleeding  Anxiety/depression-continue home medication  Scheduled Meds: . Chlorhexidine Gluconate Cloth  6 each Topical Daily  . Chlorhexidine Gluconate Cloth  6 each Topical Daily  . heparin  5,000 Units Subcutaneous Q8H  . letrozole  2.5 mg Oral Daily  . lidocaine  1 patch Transdermal Daily  . metoprolol succinate  25 mg Oral BID  . mupirocin ointment  1 application Nasal BID  . pantoprazole  40 mg Oral Daily  . potassium chloride  40 mEq Oral Q3H  . tamsulosin  0.4 mg Oral BID   Continuous Infusions: . sodium chloride 75 mL/hr at 06/07/20 0902   PRN Meds:.acetaminophen **OR** acetaminophen, clonazePAM, hydrALAZINE, HYDROmorphone (DILAUDID) injection, ondansetron **OR** ondansetron (ZOFRAN) IV, oxyCODONE, promethazine, traMADol, zolpidem  Diet Orders (From admission, onward)    Start     Ordered   06/07/20 0001  Diet NPO time specified  Diet effective midnight        06/06/20 1628          DVT prophylaxis: heparin injection 5,000 Units Start: 06/04/20 0600     Code Status: DNR  Family Communication: no family at bedside   Status is: Inpatient  Remains inpatient appropriate because:IV treatments appropriate due to intensity of illness or inability to take PO   Dispo: The patient is from: Home              Anticipated d/c is to: Home              Anticipated d/c date is: 1 day              Patient currently is not medically stable  to d/c.  Consultants:  Urology   Procedures:  06/03/2020:  1. Cystoscopy,bilateral retrograde with interpretation 2. Bilateral ureteral stent placement 3. Bladder biopsy  Microbiology  None   Antimicrobials: None     Objective: Vitals:   06/06/20 0545 06/06/20 1334 06/06/20 2057 06/07/20 0529  BP: (!) 159/76 (!) 135/59 (!) 151/61 (!) 151/73    Pulse: 81 79 92 76  Resp: 16 20 14 16   Temp: 98.5 F (36.9 C) 98.6 F (37 C) 100 F (37.8 C) 99.1 F (37.3 C)  TempSrc: Oral Oral Oral Oral  SpO2: 95% 98% 93% 96%  Weight:      Height:        Intake/Output Summary (Last 24 hours) at 06/07/2020 0912 Last data filed at 06/07/2020 0530 Gross per 24 hour  Intake 375 ml  Output 1150 ml  Net -775 ml   Filed Weights   06/03/20 1818  Weight: 59.9 kg    Examination:  Constitutional: No distress, laying in bed Eyes: No scleral icterus ENMT: Moist mucous membranes Neck: normal, supple Respiratory: Clear bilaterally without crackles or wheezing Cardiovascular: Regular rate and rhythm, no murmurs, no edema Abdomen: Mildly tender on the left lower quadrant, no guarding or rebound, bowel sounds positive Musculoskeletal: no clubbing / cyanosis.  Skin: No rashes appreciated Neurologic: Nonfocal  Data Reviewed: I have independently reviewed following labs and imaging studies   CBC: Recent Labs  Lab 06/03/20 1613 06/04/20 0823 06/05/20 0529 06/07/20 0600  WBC 6.5 5.2 8.6 12.7*  NEUTROABS 4.6  --   --   --   HGB 9.4* 10.4* 10.8* 9.1*  HCT 28.9* 31.3* 32.8* 27.1*  MCV 106.3* 104.3* 104.8* 103.4*  PLT 232 253 272 098   Basic Metabolic Panel: Recent Labs  Lab 06/03/20 1613 06/04/20 0823 06/05/20 0529 06/06/20 0559 06/07/20 0600  NA 141 143 139 139 136  K 4.8 4.1 3.4* 3.4* 3.2*  CL 109 108 105 107 106  CO2 19* 21* 22 20* 19*  GLUCOSE 81 121* 113* 120* 96  BUN 54* 48* 43* 39* 37*  CREATININE 5.03* 4.30* 3.39* 3.89* 3.74*  CALCIUM 8.1* 8.7* 8.4* 7.5* 6.9*   Liver Function Tests: Recent Labs  Lab 06/03/20 1613  AST 32  ALT 31  ALKPHOS 55  BILITOT 0.7  PROT 6.8  ALBUMIN 3.8   Coagulation Profile: No results for input(s): INR, PROTIME in the last 168 hours. HbA1C: No results for input(s): HGBA1C in the last 72 hours. CBG: No results for input(s): GLUCAP in the last 168 hours.  Recent Results (from the  past 240 hour(s))  Urine culture     Status: Abnormal   Collection Time: 06/03/20  4:13 PM   Specimen: Urine, Random  Result Value Ref Range Status   Specimen Description   Final    URINE, RANDOM Performed at [facility] [address], [place], [place] [address]    Special Requests   Final    NONE Performed at [facility], [place] [address] [place], [place] [address]    Culture (A)  Final    10,000 COLONIES/mL LACTOBACILLUS SPECIES Standardized susceptibility testing for this organism is not available. Performed at [facility], [place] [address] [place], [place] [address]    Report Status 06/04/2020 FINAL  Final  Respiratory Panel by RT PCR (Flu A&B, Covid) - Nasopharyngeal Swab     Status: None   Collection Time: 06/03/20  4:13 PM   Specimen: Nasopharyngeal Swab  Result  Value Ref Range Status   SARS Coronavirus 2 by RT PCR NEGATIVE NEGATIVE Final    Comment: (NOTE) SARS-CoV-2 target nucleic acids are NOT DETECTED.  The SARS-CoV-2 RNA is generally detectable in upper respiratoy specimens during the acute phase of infection. The lowest concentration of SARS-CoV-2 viral copies this assay can detect is 131 copies/mL. A negative result does not preclude SARS-Cov-2 infection and should not be used as the sole basis for treatment or other patient management decisions. A negative result may occur with  improper specimen collection/handling, submission of specimen other than nasopharyngeal swab, presence of viral mutation(s) within the areas targeted by this assay, and inadequate number of viral copies (<131 copies/mL). A negative result must be combined with clinical observations, patient history, and epidemiological information. The expected result is Negative.  Fact Sheet for Patients:  PinkCheek.be  Fact Sheet for Healthcare Providers:  GravelBags.it  This test is no t yet  approved or cleared by the [place] FDA and  has been authorized for detection and/or diagnosis of SARS-CoV-2 by FDA under an Emergency Use Authorization (EUA). This EUA will remain  in effect (meaning this test can be used) for the duration of the COVID-19 declaration under Section 564(b)(1) of the Act, 21 U.S.C. section 360bbb-3(b)(1), unless the authorization is terminated or revoked sooner.     Influenza A by PCR NEGATIVE NEGATIVE Final   Influenza B by PCR NEGATIVE NEGATIVE Final    Comment: (NOTE) The Xpert Xpress SARS-CoV-2/FLU/RSV assay is intended as an aid in  the diagnosis of influenza from Nasopharyngeal swab specimens and  should not be used as a sole basis for treatment. Nasal washings and  aspirates are unacceptable for Xpert Xpress SARS-CoV-2/FLU/RSV  testing.  Fact Sheet for Patients: PinkCheek.be  Fact Sheet for Healthcare Providers: GravelBags.it  This test is not yet approved or cleared by the [place] FDA and  has been authorized for detection and/or diagnosis of SARS-CoV-2 by  FDA under an Emergency Use Authorization (EUA). This EUA will remain  in effect (meaning this test can be used) for the duration of the  Covid-19 declaration under Section 564(b)(1) of the Act, 21  U.S.C. section 360bbb-3(b)(1), unless the authorization is  terminated or revoked. Performed at [facility], [place] [address], [place], [place] [address]   Surgical pcr screen     Status: Abnormal   Collection Time: 06/06/20 10:00 PM   Specimen: Nasal Mucosa; Nasal Swab  Result Value Ref Range Status   MRSA, PCR NEGATIVE NEGATIVE Final   Staphylococcus aureus POSITIVE (A) NEGATIVE Final    Comment: (NOTE) The Xpert SA Assay (FDA approved for NASAL specimens in patients 59 years of age and older), is one component of a comprehensive surveillance program. It is not intended to diagnose infection nor  to guide or monitor treatment. Performed at [facility], [place] [address], [place], [place] [address]      Radiology Studies: CT ABDOMEN PELVIS WO CONTRAST  Result Date: 06/06/2020 CLINICAL DATA:  Ureteral stent placement. Metastatic breast cancer. Left flank pain EXAM: CT ABDOMEN AND PELVIS WITHOUT CONTRAST TECHNIQUE: Multidetector CT imaging of the abdomen and pelvis was performed following the standard protocol without IV contrast. COMPARISON:  CT dated 06/03/2020 FINDINGS: Lower chest: There is a moderate to large partially visualized left-sided pleural effusion which has increased in size since the prior study.The heart size appears grossly normal. The previously demonstrated right-sided pleural effusion has improved. There are areas of atelectasis at the lung  bases bilaterally with scattered interlobular septal thickening. Hepatobiliary: The liver is normal. There is layering hyperdense material in the gallbladder lumen, new since prior study. This may represent vicarious excretion of contrast versus less likely gallbladder sludge.There is no biliary ductal dilation. Pancreas: Normal contours without ductal dilatation. No peripancreatic fluid collection. Spleen: Unremarkable. Adrenals/Urinary Tract: --Adrenal glands: Unremarkable. --Right kidney/ureter: The patient has undergone right-sided double-J ureteral stent placement. The degree of hydronephrosis is largely unchanged from prior study. --Left kidney/ureter: The patient has undergone left-sided ureteral stent placement. The stent appears well positioned. The degree of hydronephrosis is largely unchanged despite stent placement. There is extensive increased perinephric fat stranding about the left kidney with new perinephric/retroperitoneal free fluid. --Urinary bladder: The bladder is decompressed by Foley catheter. Stomach/Bowel: --Stomach/Duodenum: No hiatal hernia or other gastric abnormality. Normal duodenal course and  caliber. --Small bowel: Unremarkable. --Colon: Patient is status post placement of an end colostomy in the left lower quadrant. There is no evidence for an obstruction. --Appendix: Normal. Vascular/Lymphatic: Atherosclerotic calcification is present within the non-aneurysmal abdominal aorta, without hemodynamically significant stenosis. --No retroperitoneal lymphadenopathy. --No mesenteric lymphadenopathy. --No pelvic or inguinal lymphadenopathy. Reproductive: Status post hysterectomy. No adnexal mass. Other: No ascites or free air. There is relatively unchanged presacral soft tissue. Musculoskeletal. Metastatic osseous lesions are noted in the lumbar spine. Osseous metastatic disease is noted to the ribs. IMPRESSION: 1. Largely unchanged bilateral hydroureteronephrosis despite well placed double-J ureteral stents. On the left, there is significant interval increase in perinephric fat stranding and retroperitoneal free fluid. Findings may be secondary to recent instrumentation versus a forniceal rupture. There is no well organized fluid collection to suggest a developing abscess, however evaluation is limited by lack of IV contrast. 2. Growing left-sided moderate to large pleural effusion. Significant interval decrease in the previously demonstrated right-sided pleural effusion. 3. Additional chronic stable findings are noted as detailed above. 4.  Aortic Atherosclerosis (ICD10-I70.0). Electronically Signed   By: Constance Holster M.D.   On: 06/06/2020 15:29    Marzetta Board, MD, PhD [facility]  Between 7 am - 7 pm I am available, please contact me via Amion or Securechat  Between 7 pm - 7 am I am not available, please contact night coverage MD/APP via Amion

## 2020-06-07 NOTE — Interval H&P Note (Signed)
History and Physical Interval Note:  06/07/2020 7:34 AM  [name]  has presented today for surgery, with the diagnosis of BILATERAL hydronephrosis.  The various methods of treatment have been discussed with the patient and family. After consideration of risks, benefits and other options for treatment, the patient has consented to  Procedure(s): CYSTOSCOPY WITH RETROGRADE PYELOGRAM/URETERAL STENT PLACEMENT bladder biopsy (Bilateral) as a surgical intervention.  The patient's history has been reviewed, patient examined, no change in status, stable for surgery.  I have reviewed the patient's chart and labs.  Questions were answered to the patient's satisfaction.     [name]

## 2020-06-07 NOTE — Progress Notes (Signed)
The patient is status post left stent exchange.  She tolerated the surgery well.  She seems to be feeling better now.  I did remove the Foley catheter.  She has already voided twice.  She feels less pelvic pressure currently.  Would recommend that the patient be discharged home tomorrow if she continues to feel better improved.  Will get her to see me in follow-up in 2 months to discuss stent exchange.

## 2020-06-07 NOTE — Anesthesia Procedure Notes (Signed)
Procedure Name: LMA Insertion Date/Time: 06/07/2020 10:26 AM Performed by: Kassity Woodson D, CRNA Pre-anesthesia Checklist: Patient identified, Emergency Drugs available, Suction available and Patient being monitored Patient Re-evaluated:Patient Re-evaluated prior to induction Oxygen Delivery Method: Circle system utilized Preoxygenation: Pre-oxygenation with 100% oxygen Induction Type: IV induction Ventilation: Mask ventilation without difficulty LMA: LMA inserted LMA Size: 4.0 Tube type: Oral Number of attempts: 1 Placement Confirmation: positive ETCO2 and breath sounds checked- equal and bilateral Tube secured with: Tape Dental Injury: Teeth and Oropharynx as per pre-operative assessment

## 2020-06-07 NOTE — Transfer of Care (Signed)
Immediate Anesthesia Transfer of Care Note  Patient: [name]  Procedure(s) Performed: CYSTOSCOPY LEFT STENT EXCHANGE, retrograde pylogram (Left Urethra)  Patient Location: PACU  Anesthesia Type:General  Level of Consciousness: awake, alert  and oriented  Airway & Oxygen Therapy: Patient Spontanous Breathing and Patient connected to face mask oxygen  Post-op Assessment: Report given to RN and Post -op Vital signs reviewed and stable  Post vital signs: Reviewed and stable  Last Vitals:  Vitals Value Taken Time  BP 123/61 06/07/20 1050  Temp    Pulse 81 06/07/20 1051  Resp 9 06/07/20 1051  SpO2 100 % 06/07/20 1051  Vitals shown include unvalidated device data.  Last Pain:  Vitals:   06/07/20 0951  TempSrc: Oral  PainSc:       Patients Stated Pain Goal: 4 (15/97/33 1250)  Complications: No complications documented.

## 2020-06-07 NOTE — Op Note (Signed)
Preoperative diagnosis:  1. Persistent left-sided hydroureteronephrosis and pain  Postoperative diagnosis:  1. Same  Procedure: 1. Cystoscopy, left retrograde pyelogram with interpretation 2. Left ureteral stent exchange  Surgeon: [name], MD  Anesthesia: General  Complications: None  Intraoperative findings:  #1: The patient stent on the left-hand side did not appear to be draining very well.  I pulled it to the urethral meatus and it subsequently did begin to drain briskly. #2: The retrograde pyelogram demonstrated slightly dilated hydroureter and hydronephrosis without evidence of filling defect.  Drain: 24 cm time 7 French double-J stent in the left ureter  EBL: Minimal  Specimens: None  Indication: ISADORE BOKHARI is a 69 y.o. patient with advanced breast cancer who had bilateral hydronephrosis.  We placed stents 3 days prior.  After an uneventful and pain-free night she developed severe pain on postop day #1 in the left kidney.  This pain progressed requiring IV narcotics.  CT scan was subsequently performed which demonstrated a calyceal rupture and stranding around the left kidney with worsening hydronephrosis.  After reviewing the management options for treatment, he elected to proceed with the above surgical procedure(s). We have discussed the potential benefits and risks of the procedure, side effects of the proposed treatment, the likelihood of the patient achieving the goals of the procedure, and any potential problems that might occur during the procedure or recuperation. Informed consent has been obtained.  Description of procedure:  The patient was taken to the operating room and general anesthesia was induced.  The patient was placed in the dorsal lithotomy position, prepped and draped in the usual sterile fashion, and preoperative antibiotics were administered. A preoperative time-out was performed.   Patient's Foley catheter was removed and a 21 French 0  degree cystoscope inserted.  Cystoscopy was performed demonstrating a dropped bladder with no other real significant abnormality not previously noted from the procedure 3 days prior.  The stents appeared to be in appropriate position.  The stent from the left ureteral orifice was then grasped and pulled to the urethral meatus.  Once it was pulled down the ureter it began to drain briskly.  A sensor wire was advanced up through the stent and up into the left renal pelvis removing the stent over the wire.  I then exchanged the wire for a 5 [place] open-ended ureteral catheter performed retrograde pyelograms above findings.  I then replaced the wire, removed the catheter, and advanced a 7 [place] x24 cm double-J ureteral stent.  This was nicely placed in the lower pole as seen on fluoroscopy.  There was a nice curl within the patient's bladder.  Her bladder was subsequently emptied.  No Foley catheter was left.  Patient was extubated return the PACU in stable condition.  [name], M.D.

## 2020-06-08 ENCOUNTER — Encounter (HOSPITAL_COMMUNITY): Payer: Self-pay | Admitting: Urology

## 2020-06-08 LAB — BASIC METABOLIC PANEL
Anion gap: 9 (ref 5–15)
BUN: 37 mg/dL — ABNORMAL HIGH (ref 8–23)
CO2: 20 mmol/L — ABNORMAL LOW (ref 22–32)
Calcium: 6.7 mg/dL — ABNORMAL LOW (ref 8.9–10.3)
Chloride: 111 mmol/L (ref 98–111)
Creatinine, Ser: 2.81 mg/dL — ABNORMAL HIGH (ref 0.44–1.00)
GFR, Estimated: 18 mL/min — ABNORMAL LOW (ref >60)
Glucose, Bld: 105 mg/dL — ABNORMAL HIGH (ref 70–99)
Potassium: 3.6 mmol/L (ref 3.5–5.1)
Sodium: 140 mmol/L (ref 135–145)

## 2020-06-08 LAB — CBC
HCT: 25.6 % — ABNORMAL LOW (ref 36.0–46.0)
Hemoglobin: 8.5 g/dL — ABNORMAL LOW (ref 12.0–15.0)
MCH: 34.6 pg — ABNORMAL HIGH (ref 26.0–34.0)
MCHC: 33.2 g/dL (ref 30.0–36.0)
MCV: 104.1 fL — ABNORMAL HIGH (ref 80.0–100.0)
Platelets: 215 10*3/uL (ref 150–400)
RBC: 2.46 MIL/uL — ABNORMAL LOW (ref 3.87–5.11)
RDW: 12.2 % (ref 11.5–15.5)
WBC: 10.6 10*3/uL — ABNORMAL HIGH (ref 4.0–10.5)
nRBC: 0 % (ref 0.0–0.2)

## 2020-06-08 MED ORDER — HEPARIN SOD (PORK) LOCK FLUSH 100 UNIT/ML IV SOLN
500.0000 [IU] | Freq: Once | INTRAVENOUS | Status: AC
  Administered 2020-06-08: 500 [IU] via INTRAVENOUS
  Filled 2020-06-08: qty 5

## 2020-06-08 MED ORDER — TAMSULOSIN HCL 0.4 MG PO CAPS: 0.4000 mg | ORAL_CAPSULE | Freq: Two times a day (BID) | ORAL | 1 refills | Status: AC

## 2020-06-08 NOTE — Discharge Summary (Signed)
Physician Discharge Summary  [name]:[id number] DOB: 1950-10-17 DOA: 06/03/2020  PCP: Celene Squibb, MD  Admit date: 06/03/2020 Discharge date: 06/08/2020  Admitted From: home Disposition:  home  Recommendations for Outpatient Follow-up:  1. Follow up with Dr Jana Hakim as scheduled 2. Follow up with Dr Louis Meckel in 2 months  Home Health: none Equipment/Devices: none  Discharge Condition: stable CODE STATUS: DNR Diet recommendation: regular  HPI: Per admitting MD, [name] is a 69 y.o. female with medical history significant of metastatic BRCA, BRCA2 mutation, currently on chemo holiday. Pt with nausea, high BP worsening, increasing fatigue over past ~2 weeks or so.  Nothing makes symptoms better or worse. Found to have AKI which has progressively worsened over that time.  CT scan of ABD/Pelvis as outpt showed B hydronephrosis. Creat 5.0. Sent to ED, and then taken to OR by Dr. Louis Meckel for placement of B ureteral stents.  Hospital Course / Discharge diagnoses: Principal Problem Acute kidney injury due to obstructive uropathy, on chronic kidney disease stage IIIa -Creatinine 1.2 in September 2021, progressively worsening since then, 5 on admission.  Urology consulted, she is status post cystoscopy, bilateral ureteral stent placement and bladder biopsy. Postop course complicated by worsening left flank pain, underwent repeat imaging which showed persistent left-sided hydronephrosis, concerning for stent malfunction, she was taken again to the operating room on 10/29 and underwent stent exchange on left.  With that, her pain resolved, her kidney function started to improve, discussed with urology and she is stable for discharge with outpatient follow-up.  She was placed on Flomax. Active Problems Metastatic breast cancer to the bone-follows as an outpatient History of anal cancer, status post and colostomy-believed to be in remission Essential hypertension-resume home  regimen on discharge Macrocytic anemia-anemia of chronic disease, no bleeding, outpatient follow up Anxiety/depression-continue home medication  Discharge Instructions   Allergies as of 06/08/2020      Reactions   Ciprofloxacin Other (See Comments)   tendonitis   Prochlorperazine Anxiety   Other reaction(s): Confusion      Medication List    TAKE these medications   acetaminophen 500 MG tablet Commonly known as: TYLENOL Take 1,000 mg by mouth every 6 (six) hours as needed for moderate pain or headache.   buPROPion 100 MG 12 hr tablet Commonly known as: WELLBUTRIN SR Take 100 mg by mouth 2 (two) times daily.   CALCIUM-VITAMIN D PO Take 1 tablet by mouth 2 (two) times daily.   clonazePAM 0.5 MG tablet Commonly known as: KLONOPIN Take 0.5 mg by mouth 2 (two) times daily as needed for anxiety.   letrozole 2.5 MG tablet Commonly known as: FEMARA Take 2.5 mg by mouth daily.   metoprolol succinate 25 MG 24 hr tablet Commonly known as: TOPROL-XL Take 25 mg by mouth in the morning and at bedtime.   omeprazole 40 MG capsule Commonly known as: PRILOSEC Take 1 capsule (40 mg total) by mouth 2 (two) times daily. What changed: when to take this   potassium chloride 10 MEQ tablet Commonly known as: KLOR-CON Take 20 mEq by mouth 2 (two) times daily.   tamsulosin 0.4 MG Caps capsule Commonly known as: FLOMAX Take 1 capsule (0.4 mg total) by mouth 2 (two) times daily.   vitamin B-12 1000 MCG tablet Commonly known as: CYANOCOBALAMIN Take 1,000 mcg by mouth 2 (two) times daily.   Xgeva 120 MG/1.7ML Soln injection Generic drug: denosumab Inject 120 mg into the skin every 3 (three) months.  Follow-up Information    [name], MD In 2 months.   Specialty: Urology Contact information: [place] [address] [phone]               Consultations:  Urology   Procedures/Studies:  Bilateral stent placement 10/26  Left Stent  exchange 10/29  CT ABDOMEN PELVIS WO CONTRAST  Result Date: 06/06/2020 CLINICAL DATA:  Ureteral stent placement. Metastatic breast cancer. Left flank pain EXAM: CT ABDOMEN AND PELVIS WITHOUT CONTRAST TECHNIQUE: Multidetector CT imaging of the abdomen and pelvis was performed following the standard protocol without IV contrast. COMPARISON:  CT dated 06/03/2020 FINDINGS: Lower chest: There is a moderate to large partially visualized left-sided pleural effusion which has increased in size since the prior study.The heart size appears grossly normal. The previously demonstrated right-sided pleural effusion has improved. There are areas of atelectasis at the lung bases bilaterally with scattered interlobular septal thickening. Hepatobiliary: The liver is normal. There is layering hyperdense material in the gallbladder lumen, new since prior study. This may represent vicarious excretion of contrast versus less likely gallbladder sludge.There is no biliary ductal dilation. Pancreas: Normal contours without ductal dilatation. No peripancreatic fluid collection. Spleen: Unremarkable. Adrenals/Urinary Tract: --Adrenal glands: Unremarkable. --Right kidney/ureter: The patient has undergone right-sided double-J ureteral stent placement. The degree of hydronephrosis is largely unchanged from prior study. --Left kidney/ureter: The patient has undergone left-sided ureteral stent placement. The stent appears well positioned. The degree of hydronephrosis is largely unchanged despite stent placement. There is extensive increased perinephric fat stranding about the left kidney with new perinephric/retroperitoneal free fluid. --Urinary bladder: The bladder is decompressed by Foley catheter. Stomach/Bowel: --Stomach/Duodenum: No hiatal hernia or other gastric abnormality. Normal duodenal course and caliber. --Small bowel: Unremarkable. --Colon: Patient is status post placement of an end colostomy in the left lower quadrant. There is  no evidence for an obstruction. --Appendix: Normal. Vascular/Lymphatic: Atherosclerotic calcification is present within the non-aneurysmal abdominal aorta, without hemodynamically significant stenosis. --No retroperitoneal lymphadenopathy. --No mesenteric lymphadenopathy. --No pelvic or inguinal lymphadenopathy. Reproductive: Status post hysterectomy. No adnexal mass. Other: No ascites or free air. There is relatively unchanged presacral soft tissue. Musculoskeletal. Metastatic osseous lesions are noted in the lumbar spine. Osseous metastatic disease is noted to the ribs. IMPRESSION: 1. Largely unchanged bilateral hydroureteronephrosis despite well placed double-J ureteral stents. On the left, there is significant interval increase in perinephric fat stranding and retroperitoneal free fluid. Findings may be secondary to recent instrumentation versus a forniceal rupture. There is no well organized fluid collection to suggest a developing abscess, however evaluation is limited by lack of IV contrast. 2. Growing left-sided moderate to large pleural effusion. Significant interval decrease in the previously demonstrated right-sided pleural effusion. 3. Additional chronic stable findings are noted as detailed above. 4.  Aortic Atherosclerosis (ICD10-I70.0). Electronically Signed   By: Constance Holster M.D.   On: 06/06/2020 15:29   US RENAL  Result Date: 06/05/2020 CLINICAL DATA:  Left-sided flank pain status post stent placement EXAM: RENAL / URINARY TRACT ULTRASOUND COMPLETE COMPARISON:  CT 06/03/2020 FINDINGS: Right Kidney: Renal measurements: 10.2 x 4.9 x 6 cm = volume: 157 mL. Echogenicity within normal limits. Mild right hydronephrosis, likely decreased as compared with prior CT. No mass. Left Kidney: Renal measurements: 11.5 x 6.3 x 6.2 cm = volume: 235 mL. Echogenicity within normal limits. No mass. Mild left hydronephrosis, grossly unchanged compared with CT. Bladder: Nonvisualized, bladder appears empty.  Other: None. IMPRESSION: 1. Mild bilateral hydronephrosis, left greater than right.  Hydronephrosis likely improved on the right and unchanged on the left as compared with CT from 06/03/2020. 2. Reported urinary tract stents are not demonstrated on today's ultrasound. Electronically Signed   By: Donavan Foil M.D.   On: 06/05/2020 16:14   DG C-Arm 1-60 Min-No Report  Result Date: 06/07/2020 Fluoroscopy was utilized by the requesting physician.  No radiographic interpretation.   DG C-Arm 1-60 Min-No Report  Result Date: 06/03/2020 Fluoroscopy was utilized by the requesting physician.  No radiographic interpretation.   CT RENAL STONE STUDY  Result Date: 06/03/2020 CLINICAL DATA:  Elevated creatinine, flank pain, no history of renal stones, history of left breast cancer EXAM: CT ABDOMEN AND PELVIS WITHOUT CONTRAST TECHNIQUE: Multidetector CT imaging of the abdomen and pelvis was performed following the standard protocol without IV contrast. COMPARISON:  PET-CT, 08/10/2019 FINDINGS: Lower chest: Small bilateral pleural effusions and associated atelectasis or consolidation. Interlobular septal thickening throughout the included lower lungs, in addition to bandlike scarring or atelectasis. Hepatobiliary: No solid liver abnormality is seen. No gallstones, gallbladder wall thickening, or biliary dilatation. Pancreas: Unremarkable. No pancreatic ductal dilatation or surrounding inflammatory changes. Spleen: Normal in size without significant abnormality. Adrenals/Urinary Tract: Adrenal glands are unremarkable. There is moderate right, mild left hydronephrosis and proximal hydroureter without visualized obstructing calculi or other obvious etiology. There is retroperitoneal fat stranding in the vicinity of the mid ureters (series 2, image 42) and calcifications which are of the iliac vessels and do not appear to be within the ureters. Bladder is unremarkable. Stomach/Bowel: Stomach is within normal limits. The  appendix is not clearly visualized. Status post abdominoperineal resection with left lower quadrant end colostomy. Vascular/Lymphatic: Aortic atherosclerosis. No enlarged abdominal or pelvic lymph nodes. Reproductive: The uterus is present although in an abnormally posterior location status post abdominoperineal resection. Other: No abdominal wall hernia or abnormality. No abdominopelvic ascites. Musculoskeletal: No acute or significant osseous findings. IMPRESSION: 1. There is moderate right, mild left hydronephrosis and proximal hydroureter without visualized obstructing calculi or other obvious etiology. There is retroperitoneal fat stranding in the vicinity of the mid ureters which is of uncertain nature. Contrast enhanced CT urogram may be helpful to better evaluate for obstructing etiology. 2. Status post abdominoperineal resection with left lower quadrant end colostomy. Appearance of the low pelvis is unchanged compared to prior PET-CT dated 08/10/2019 without evidence of mass or lymphadenopathy. 3. Small bilateral pleural effusions and associated atelectasis or consolidation. Interlobular septal thickening throughout the included lower lungs, in addition to bandlike scarring or atelectasis. Findings are most consistent with pulmonary edema. 4. Aortic Atherosclerosis (ICD10-I70.0). Electronically Signed   By: Eddie Candle M.D.   On: 06/03/2020 11:31      Subjective: - no chest pain, shortness of breath, no abdominal pain, nausea or vomiting.   Discharge Exam: BP 140/80 (BP Location: Right Arm)   Pulse 73   Temp 98.2 F (36.8 C) (Oral)   Resp 20   Ht _0  (1.651 m)   Wt 59.9 kg   SpO2 98%   BMI 21.97 kg/m   General: Pt is alert, awake, not in acute distress Cardiovascular: RRR, S1/S2 +, no rubs, no gallops Respiratory: CTA bilaterally, no wheezing, no rhonchi Abdominal: Soft, NT, ND, bowel sounds + Extremities: no edema, no cyanosis    The results of significant diagnostics from  this hospitalization (including imaging, microbiology, ancillary and laboratory) are listed below for reference.     Microbiology: Recent Results (from the past 240 hour(s))  Urine culture  Status: Abnormal   Collection Time: 06/03/20  4:13 PM   Specimen: Urine, Random  Result Value Ref Range Status   Specimen Description   Final    URINE, RANDOM Performed at [facility] [address], [place], [place] [address]    Special Requests   Final    NONE Performed at [facility], [place] [address] [place], [place] [address]    Culture (A)  Final    10,000 COLONIES/mL LACTOBACILLUS SPECIES Standardized susceptibility testing for this organism is not available. Performed at [facility], [place] [address], [place], [place] [address]    Report Status 06/04/2020 FINAL  Final  Respiratory Panel by RT PCR (Flu A&B, Covid) - Nasopharyngeal Swab     Status: None   Collection Time: 06/03/20  4:13 PM   Specimen: Nasopharyngeal Swab  Result Value Ref Range Status   SARS Coronavirus 2 by RT PCR NEGATIVE NEGATIVE Final    Comment: (NOTE) SARS-CoV-2 target nucleic acids are NOT DETECTED.  The SARS-CoV-2 RNA is generally detectable in upper respiratoy specimens during the acute phase of infection. The lowest concentration of SARS-CoV-2 viral copies this assay can detect is 131 copies/mL. A negative result does not preclude SARS-Cov-2 infection and should not be used as the sole basis for treatment or other patient management decisions. A negative result may occur with  improper specimen collection/handling, submission of specimen other than nasopharyngeal swab, presence of viral mutation(s) within the areas targeted by this assay, and inadequate number of viral copies (<131 copies/mL). A negative result must be combined with clinical observations, patient history, and epidemiological information. The expected result is Negative.  Fact Sheet for  Patients:  PinkCheek.be  Fact Sheet for Healthcare Providers:  GravelBags.it  This test is no t yet approved or cleared by the [place] FDA and  has been authorized for detection and/or diagnosis of SARS-CoV-2 by FDA under an Emergency Use Authorization (EUA). This EUA will remain  in effect (meaning this test can be used) for the duration of the COVID-19 declaration under Section 564(b)(1) of the Act, 21 U.S.C. section 360bbb-3(b)(1), unless the authorization is terminated or revoked sooner.     Influenza A by PCR NEGATIVE NEGATIVE Final   Influenza B by PCR NEGATIVE NEGATIVE Final    Comment: (NOTE) The Xpert Xpress SARS-CoV-2/FLU/RSV assay is intended as an aid in  the diagnosis of influenza from Nasopharyngeal swab specimens and  should not be used as a sole basis for treatment. Nasal washings and  aspirates are unacceptable for Xpert Xpress SARS-CoV-2/FLU/RSV  testing.  Fact Sheet for Patients: PinkCheek.be  Fact Sheet for Healthcare Providers: GravelBags.it  This test is not yet approved or cleared by the [place] FDA and  has been authorized for detection and/or diagnosis of SARS-CoV-2 by  FDA under an Emergency Use Authorization (EUA). This EUA will remain  in effect (meaning this test can be used) for the duration of the  Covid-19 declaration under Section 564(b)(1) of the Act, 21  U.S.C. section 360bbb-3(b)(1), unless the authorization is  terminated or revoked. Performed at [facility], [place] [address], [place], [place] [address]   Surgical pcr screen     Status: Abnormal   Collection Time: 06/06/20 10:00 PM   Specimen: Nasal Mucosa; Nasal Swab  Result Value Ref Range Status   MRSA, PCR NEGATIVE NEGATIVE Final   Staphylococcus aureus POSITIVE (A) NEGATIVE Final    Comment: (NOTE) The Xpert SA Assay (FDA  approved for  NASAL specimens in patients 5 years of age and older), is one component of a comprehensive surveillance program. It is not intended to diagnose infection nor to guide or monitor treatment. Performed at [facility], [place] [address] [place], [place] [address]      Labs: Basic Metabolic Panel: Recent Labs  Lab 06/04/20 0823 06/05/20 0529 06/06/20 0559 06/07/20 0600 06/08/20 0520  NA 143 139 139 136 140  K 4.1 3.4* 3.4* 3.2* 3.6  CL 108 105 107 106 111  CO2 21* 22 20* 19* 20*  GLUCOSE 121* 113* 120* 96 105*  BUN 48* 43* 39* 37* 37*  CREATININE 4.30* 3.39* 3.89* 3.74* 2.81*  CALCIUM 8.7* 8.4* 7.5* 6.9* 6.7*   Liver Function Tests: Recent Labs  Lab 06/03/20 1613  AST 32  ALT 31  ALKPHOS 55  BILITOT 0.7  PROT 6.8  ALBUMIN 3.8   CBC: Recent Labs  Lab 06/03/20 1613 06/04/20 0823 06/05/20 0529 06/07/20 0600 06/08/20 0520  WBC 6.5 5.2 8.6 12.7* 10.6*  NEUTROABS 4.6  --   --   --   --   HGB 9.4* 10.4* 10.8* 9.1* 8.5*  HCT 28.9* 31.3* 32.8* 27.1* 25.6*  MCV 106.3* 104.3* 104.8* 103.4* 104.1*  PLT 232 253 272 228 215   CBG: No results for input(s): GLUCAP in the last 168 hours. Hgb A1c No results for input(s): HGBA1C in the last 72 hours. Lipid Profile No results for input(s): CHOL, HDL, LDLCALC, TRIG, CHOLHDL, LDLDIRECT in the last 72 hours. Thyroid function studies No results for input(s): TSH, T4TOTAL, T3FREE, THYROIDAB in the last 72 hours.  Invalid input(s): FREET3 Urinalysis    Component Value Date/Time   COLORURINE STRAW (A) 06/03/2020 1613   APPEARANCEUR CLEAR 06/03/2020 1613   LABSPEC 1.008 06/03/2020 1613   PHURINE 6.0 06/03/2020 1613   GLUCOSEU NEGATIVE 06/03/2020 1613   HGBUR NEGATIVE 06/03/2020 1613   BILIRUBINUR NEGATIVE 06/03/2020 1613   KETONESUR NEGATIVE 06/03/2020 1613   PROTEINUR NEGATIVE 06/03/2020 1613   UROBILINOGEN 0.2 08/06/2008 1500   NITRITE NEGATIVE 06/03/2020 1613   LEUKOCYTESUR NEGATIVE 06/03/2020 1613     FURTHER DISCHARGE INSTRUCTIONS:   Get Medicines reviewed and adjusted: Please take all your medications with you for your next visit with your Primary MD   Laboratory/radiological data: Please request your Primary MD to go over all hospital tests and procedure/radiological results at the follow up, please ask your Primary MD to get all Hospital records sent to his/her office.   In some cases, they will be blood work, cultures and biopsy results pending at the time of your discharge. Please request that your primary care M.D. goes through all the records of your hospital data and follows up on these results.   Also Note the following: If you experience worsening of your admission symptoms, develop shortness of breath, life threatening emergency, suicidal or homicidal thoughts you must seek medical attention immediately by calling 911 or calling your MD immediately  if symptoms less severe.   You must read complete instructions/literature along with all the possible adverse reactions/side effects for all the Medicines you take and that have been prescribed to you. Take any new Medicines after you have completely understood and accpet all the possible adverse reactions/side effects.    Do not drive when taking Pain medications or sleeping medications (Benzodaizepines)   Do not take more than prescribed Pain, Sleep and Anxiety Medications. It is not advisable to combine anxiety,sleep and pain medications without talking  with your primary care practitioner   Special Instructions: If you have smoked or chewed Tobacco  in the last 2 yrs please stop smoking, stop any regular Alcohol  and or any Recreational drug use.   Wear Seat belts while driving.   Please note: You were cared for by a hospitalist during your hospital stay. Once you are discharged, your primary care physician will handle any further medical issues. Please note that NO REFILLS for any discharge medications will be authorized  once you are discharged, as it is imperative that you return to your primary care physician (or establish a relationship with a primary care physician if you do not have one) for your post hospital discharge needs so that they can reassess your need for medications and monitor your lab values.  Time coordinating discharge: 35 minutes  SIGNED:  Marzetta Board, MD, PhD 06/08/2020, 10:24 AM

## 2020-06-08 NOTE — Progress Notes (Signed)
Urology Inpatient Progress Report  Hydronephrosis, unspecified hydronephrosis type [N13.30] Acute renal failure, unspecified acute renal failure type ([place]) [N17.9] Acute renal failure (ARF) (HCC) [N17.9] Acute bilateral obstructive uropathy [N13.9] Hydronephrosis [N13.30]  Procedure(s): CYSTOSCOPY LEFT STENT EXCHANGE, retrograde pylogram  1 Day Post-Op   Intv/Subj: No acute events overnight. Patient is without complaint.  Patient feeling much better.  She has not voided a few times on her own.  Her urine has started to clear.  She denies any nausea.  She has not required any significant or heavy pain medication.  Principal Problem:   Acute bilateral obstructive uropathy Active Problems:   Adenocarcinoma of left breast   Anal cancer (HCC)   AKI (acute kidney injury) ([place])   Bilateral hydronephrosis   Acute renal failure (ARF) (HCC)   Hydronephrosis  Current Facility-Administered Medications  Medication Dose Route Frequency Provider Last Rate Last Admin  . 0.9 %  sodium chloride infusion   Intravenous Continuous Caren Griffins, MD 75 mL/hr at 06/08/20 0000 New Bag/Given (Non-Interop) at 06/08/20 0000  . acetaminophen (TYLENOL) tablet 650 mg  650 mg Oral Q6H PRN Etta Quill, DO   650 mg at 06/06/20 3151   Or  . acetaminophen (TYLENOL) suppository 650 mg  650 mg Rectal Q6H PRN Etta Quill, DO      . Chlorhexidine Gluconate Cloth 2 % PADS 6 each  6 each Topical Daily Modena Jansky, MD   6 each at 06/08/20 0935  . Chlorhexidine Gluconate Cloth 2 % PADS 6 each  6 each Topical Daily Gherghe, Vella Redhead, MD      . clonazePAM [facility] [place]) tablet 0.5 mg  0.5 mg Oral BID PRN Etta Quill, DO      . heparin injection 5,000 Units  5,000 Units Subcutaneous Q8H Jennette Kettle M, DO   5,000 Units at 06/08/20 7616  . hydrALAZINE (APRESOLINE) tablet 25 mg  25 mg Oral Q8H PRN Modena Jansky, MD   25 mg at 06/05/20 2122  . HYDROmorphone (DILAUDID) injection 0.5-1 mg  0.5-1 mg  Intravenous Q4H PRN Caren Griffins, MD   1 mg at 06/07/20 0742  . letrozole [facility]) tablet 2.5 mg  2.5 mg Oral Daily Jennette Kettle M, DO   2.5 mg at 06/08/20 0934  . lidocaine (LIDODERM) 5 % 1 patch  1 patch Transdermal Daily Caren Griffins, MD   1 patch at 06/06/20 1038  . metoprolol succinate (TOPROL-XL) 24 hr tablet 25 mg  25 mg Oral BID Jennette Kettle M, DO   25 mg at 06/08/20 0737  . mupirocin ointment (BACTROBAN) 2 % 1 application  1 application Nasal BID Caren Griffins, MD   1 application at 10/62/69 0935  . ondansetron (ZOFRAN) tablet 4 mg  4 mg Oral Q6H PRN Etta Quill, DO   4 mg at 06/06/20 0859   Or  . ondansetron (ZOFRAN) injection 4 mg  4 mg Intravenous Q6H PRN Etta Quill, DO   4 mg at 06/07/20 0742  . oxyCODONE (Oxy IR/ROXICODONE) immediate release tablet 10 mg  10 mg Oral Q4H PRN Caren Griffins, MD   10 mg at 06/07/20 0858  . pantoprazole (PROTONIX) EC tablet 40 mg  40 mg Oral Daily Lenis Noon, RPH   40 mg at 06/08/20 4854  . promethazine (PHENERGAN) injection 12.5 mg  12.5 mg Intravenous Q6H PRN Lang Snow, FNP   12.5 mg at 06/07/20 0241  . tamsulosin (FLOMAX) capsule 0.4 mg  0.4  mg Oral BID Caren Griffins, MD   0.4 mg at 06/08/20 0934  . traMADol (ULTRAM) tablet 50-100 mg  50-100 mg Oral Q12H PRN [name], MD   50 mg at 06/05/20 0647  . zolpidem (AMBIEN) tablet 5 mg  5 mg Oral QHS PRN,MR X 1 [name], MD   5 mg at 06/04/20 2230     Objective: Vital: Vitals:   06/07/20 1130 06/07/20 1202 06/07/20 2137 06/08/20 0630  BP: (!) 131/59 (!) 150/71 139/69 140/80  Pulse: 71 84 83 73  Resp: 16 20 20 20   Temp: 98.6 F (37 C) 98.6 F (37 C) 98.7 F (37.1 C) 98.2 F (36.8 C)  TempSrc:  Oral Oral Oral  SpO2: 98% 100% 97% 98%  Weight:      Height:       I/Os: I/O last 3 completed shifts: In: 2105 [I.V.:2105] Out: 700 [Urine:700]  Physical Exam:  General: Patient is in no apparent distress Lungs: Normal respiratory  effort, chest expands symmetrically. GI: The abdomen is soft and nontender without mass. Ext: lower extremities symmetric  Lab Results: Recent Labs    06/07/20 0600 06/08/20 0520  WBC 12.7* 10.6*  HGB 9.1* 8.5*  HCT 27.1* 25.6*   Recent Labs    06/06/20 0559 06/07/20 0600 06/08/20 0520  NA 139 136 140  K 3.4* 3.2* 3.6  CL 107 106 111  CO2 20* 19* 20*  GLUCOSE 120* 96 105*  BUN 39* 37* 37*  CREATININE 3.89* 3.74* 2.81*  CALCIUM 7.5* 6.9* 6.7*   No results for input(s): LABPT, INR in the last 72 hours. No results for input(s): LABURIN in the last 72 hours. Results for orders placed or performed during the hospital encounter of 06/03/20  Urine culture     Status: Abnormal   Collection Time: 06/03/20  4:13 PM   Specimen: Urine, Random  Result Value Ref Range Status   Specimen Description   Final    URINE, RANDOM Performed at [facility] [address] [place], [place] [address]    Special Requests   Final    NONE Performed at [facility], [place] [address] [place], [place] [address]    Culture (A)  Final    10,000 COLONIES/mL LACTOBACILLUS SPECIES Standardized susceptibility testing for this organism is not available. Performed at [facility], [place] [address], [place], [place] [address]    Report Status 06/04/2020 FINAL  Final  Respiratory Panel by RT PCR (Flu A&B, Covid) - Nasopharyngeal Swab     Status: None   Collection Time: 06/03/20  4:13 PM   Specimen: Nasopharyngeal Swab  Result Value Ref Range Status   SARS Coronavirus 2 by RT PCR NEGATIVE NEGATIVE Final    Comment: (NOTE) SARS-CoV-2 target nucleic acids are NOT DETECTED.  The SARS-CoV-2 RNA is generally detectable in upper respiratoy specimens during the acute phase of infection. The lowest concentration of SARS-CoV-2 viral copies this assay can detect is 131 copies/mL. A negative result does not preclude SARS-Cov-2 infection and should not be used as the  sole basis for treatment or other patient management decisions. A negative result may occur with  improper specimen collection/handling, submission of specimen other than nasopharyngeal swab, presence of viral mutation(s) within the areas targeted by this assay, and inadequate number of viral copies (<131 copies/mL). A negative result must be combined with clinical observations, patient history, and epidemiological information. The expected result is Negative.  Fact Sheet for Patients:  PinkCheek.be  Fact Sheet for Healthcare Providers:  GravelBags.it  This test is no t yet approved or cleared by the [place] FDA and  has been authorized for detection and/or diagnosis of SARS-CoV-2 by FDA under an Emergency Use Authorization (EUA). This EUA will remain  in effect (meaning this test can be used) for the duration of the COVID-19 declaration under Section 564(b)(1) of the Act, 21 U.S.C. section 360bbb-3(b)(1), unless the authorization is terminated or revoked sooner.     Influenza A by PCR NEGATIVE NEGATIVE Final   Influenza B by PCR NEGATIVE NEGATIVE Final    Comment: (NOTE) The Xpert Xpress SARS-CoV-2/FLU/RSV assay is intended as an aid in  the diagnosis of influenza from Nasopharyngeal swab specimens and  should not be used as a sole basis for treatment. Nasal washings and  aspirates are unacceptable for Xpert Xpress SARS-CoV-2/FLU/RSV  testing.  Fact Sheet for Patients: PinkCheek.be  Fact Sheet for Healthcare Providers: GravelBags.it  This test is not yet approved or cleared by the [place] FDA and  has been authorized for detection and/or diagnosis of SARS-CoV-2 by  FDA under an Emergency Use Authorization (EUA). This EUA will remain  in effect (meaning this test can be used) for the duration of the  Covid-19 declaration under Section 564(b)(1) of  the Act, 21  U.S.C. section 360bbb-3(b)(1), unless the authorization is  terminated or revoked. Performed at [facility], [place] [address] [place], [place] [address]   Surgical pcr screen     Status: Abnormal   Collection Time: 06/06/20 10:00 PM   Specimen: Nasal Mucosa; Nasal Swab  Result Value Ref Range Status   MRSA, PCR NEGATIVE NEGATIVE Final   Staphylococcus aureus POSITIVE (A) NEGATIVE Final    Comment: (NOTE) The Xpert SA Assay (FDA approved for NASAL specimens in patients 35 years of age and older), is one component of a comprehensive surveillance program. It is not intended to diagnose infection nor to guide or monitor treatment. Performed at [facility], [place] [address], [place], [place] [address]     Studies/Results: CT ABDOMEN PELVIS WO CONTRAST  Result Date: 06/06/2020 CLINICAL DATA:  Ureteral stent placement. Metastatic breast cancer. Left flank pain EXAM: CT ABDOMEN AND PELVIS WITHOUT CONTRAST TECHNIQUE: Multidetector CT imaging of the abdomen and pelvis was performed following the standard protocol without IV contrast. COMPARISON:  CT dated 06/03/2020 FINDINGS: Lower chest: There is a moderate to large partially visualized left-sided pleural effusion which has increased in size since the prior study.The heart size appears grossly normal. The previously demonstrated right-sided pleural effusion has improved. There are areas of atelectasis at the lung bases bilaterally with scattered interlobular septal thickening. Hepatobiliary: The liver is normal. There is layering hyperdense material in the gallbladder lumen, new since prior study. This may represent vicarious excretion of contrast versus less likely gallbladder sludge.There is no biliary ductal dilation. Pancreas: Normal contours without ductal dilatation. No peripancreatic fluid collection. Spleen: Unremarkable. Adrenals/Urinary Tract: --Adrenal glands: Unremarkable. --Right  kidney/ureter: The patient has undergone right-sided double-J ureteral stent placement. The degree of hydronephrosis is largely unchanged from prior study. --Left kidney/ureter: The patient has undergone left-sided ureteral stent placement. The stent appears well positioned. The degree of hydronephrosis is largely unchanged despite stent placement. There is extensive increased perinephric fat stranding about the left kidney with new perinephric/retroperitoneal free fluid. --Urinary bladder: The bladder is decompressed by Foley catheter. Stomach/Bowel: --Stomach/Duodenum: No hiatal hernia or other gastric abnormality. Normal duodenal course and caliber. --Small  bowel: Unremarkable. --Colon: Patient is status post placement of an end colostomy in the left lower quadrant. There is no evidence for an obstruction. --Appendix: Normal. Vascular/Lymphatic: Atherosclerotic calcification is present within the non-aneurysmal abdominal aorta, without hemodynamically significant stenosis. --No retroperitoneal lymphadenopathy. --No mesenteric lymphadenopathy. --No pelvic or inguinal lymphadenopathy. Reproductive: Status post hysterectomy. No adnexal mass. Other: No ascites or free air. There is relatively unchanged presacral soft tissue. Musculoskeletal. Metastatic osseous lesions are noted in the lumbar spine. Osseous metastatic disease is noted to the ribs. IMPRESSION: 1. Largely unchanged bilateral hydroureteronephrosis despite well placed double-J ureteral stents. On the left, there is significant interval increase in perinephric fat stranding and retroperitoneal free fluid. Findings may be secondary to recent instrumentation versus a forniceal rupture. There is no well organized fluid collection to suggest a developing abscess, however evaluation is limited by lack of IV contrast. 2. Growing left-sided moderate to large pleural effusion. Significant interval decrease in the previously demonstrated right-sided pleural  effusion. 3. Additional chronic stable findings are noted as detailed above. 4.  Aortic Atherosclerosis (ICD10-I70.0). Electronically Signed   By: Constance Holster M.D.   On: 06/06/2020 15:29   DG C-Arm 1-60 Min-No Report  Result Date: 06/07/2020 Fluoroscopy was utilized by the requesting physician.  No radiographic interpretation.    Assessment: Procedure(s): CYSTOSCOPY LEFT STENT EXCHANGE, retrograde pylogram, 1 Day Post-Op  doing well.   Plan: The patient is doing better today after the stent exchange.  Her creatinine has started to improve.  I think some of her elevated creatinine may be the resorption of the urine in the left retroperitoneum, her kidney function may actually be better than it is represented.  Either way, she is feeling better, feel that she be good for discharge today.  We have discussed return precautions and follow-up.  Our plan will be for her to have her for stent exchange within 3 months.  We will see her in 2 months to get that organized.`   Louis Meckel, MD Urology 06/08/2020, 9:50 AM

## 2020-06-08 NOTE — Progress Notes (Signed)
Patient is given all discharge instructions. Patient given Flomax instructions and patient understands all instructions including administration, side effects, and indication.   Patient's port was decanulated by Jenny Reichmann, RN. Patient was wheeled down via wheelchair to private vehicle.

## 2020-06-09 ENCOUNTER — Other Ambulatory Visit: Payer: Self-pay | Admitting: Oncology

## 2020-06-09 DIAGNOSIS — C210 Malignant neoplasm of anus, unspecified: Secondary | ICD-10-CM

## 2020-06-09 DIAGNOSIS — N1330 Unspecified hydronephrosis: Secondary | ICD-10-CM

## 2020-06-09 DIAGNOSIS — C50812 Malignant neoplasm of overlapping sites of left female breast: Secondary | ICD-10-CM

## 2020-06-09 DIAGNOSIS — C7951 Secondary malignant neoplasm of bone: Secondary | ICD-10-CM

## 2020-06-09 DIAGNOSIS — Z170 Estrogen receptor positive status [ER+]: Secondary | ICD-10-CM

## 2020-06-09 NOTE — Progress Notes (Signed)
I will be seeing this patient [name] on 06/21/2020.  She just had some studies showing questionable involvement of the mesentery and bilateral hydronephrosis.  We do not know the reason for the hydronephrosis and we do not know whether her cancer is still estrogen receptor positive and for that reason I have ordered a's [name] scan to be done before the visit.

## 2020-06-10 ENCOUNTER — Telehealth: Payer: Self-pay | Admitting: Oncology

## 2020-06-10 NOTE — Telephone Encounter (Signed)
Called pt per 10/31 sch msg - left message for patient with appt date and time

## 2020-06-15 ENCOUNTER — Other Ambulatory Visit: Payer: Self-pay | Admitting: Urology

## 2020-06-17 ENCOUNTER — Encounter (HOSPITAL_COMMUNITY): Payer: Self-pay

## 2020-06-17 ENCOUNTER — Emergency Department (HOSPITAL_COMMUNITY): Payer: Medicare Other

## 2020-06-17 ENCOUNTER — Inpatient Hospital Stay (HOSPITAL_COMMUNITY)
Admission: EM | Admit: 2020-06-17 | Discharge: 2020-06-22 | DRG: 683 | Disposition: A | Discharge status: Home Health | Payer: Medicare Other | Attending: Internal Medicine | Admitting: Internal Medicine

## 2020-06-17 ENCOUNTER — Inpatient Hospital Stay (HOSPITAL_COMMUNITY): Payer: Medicare Other

## 2020-06-17 ENCOUNTER — Other Ambulatory Visit: Payer: Self-pay

## 2020-06-17 DIAGNOSIS — N1330 Unspecified hydronephrosis: Secondary | ICD-10-CM | POA: Diagnosis not present

## 2020-06-17 DIAGNOSIS — E875 Hyperkalemia: Secondary | ICD-10-CM | POA: Diagnosis present

## 2020-06-17 DIAGNOSIS — Z8249 Family history of ischemic heart disease and other diseases of the circulatory system: Secondary | ICD-10-CM

## 2020-06-17 DIAGNOSIS — R110 Nausea: Secondary | ICD-10-CM | POA: Diagnosis not present

## 2020-06-17 DIAGNOSIS — C50911 Malignant neoplasm of unspecified site of right female breast: Secondary | ICD-10-CM

## 2020-06-17 DIAGNOSIS — E871 Hypo-osmolality and hyponatremia: Secondary | ICD-10-CM | POA: Diagnosis present

## 2020-06-17 DIAGNOSIS — E876 Hypokalemia: Secondary | ICD-10-CM | POA: Diagnosis not present

## 2020-06-17 DIAGNOSIS — Z170 Estrogen receptor positive status [ER+]: Secondary | ICD-10-CM

## 2020-06-17 DIAGNOSIS — M810 Age-related osteoporosis without current pathological fracture: Secondary | ICD-10-CM | POA: Diagnosis present

## 2020-06-17 DIAGNOSIS — Z20822 Contact with and (suspected) exposure to covid-19: Secondary | ICD-10-CM | POA: Diagnosis present

## 2020-06-17 DIAGNOSIS — N179 Acute kidney failure, unspecified: Principal | ICD-10-CM | POA: Diagnosis present

## 2020-06-17 DIAGNOSIS — E861 Hypovolemia: Secondary | ICD-10-CM | POA: Diagnosis not present

## 2020-06-17 DIAGNOSIS — Z853 Personal history of malignant neoplasm of breast: Secondary | ICD-10-CM

## 2020-06-17 DIAGNOSIS — K5900 Constipation, unspecified: Secondary | ICD-10-CM | POA: Diagnosis not present

## 2020-06-17 DIAGNOSIS — Z936 Other artificial openings of urinary tract status: Secondary | ICD-10-CM | POA: Diagnosis not present

## 2020-06-17 DIAGNOSIS — R109 Unspecified abdominal pain: Secondary | ICD-10-CM | POA: Diagnosis not present

## 2020-06-17 DIAGNOSIS — F32A Depression, unspecified: Secondary | ICD-10-CM | POA: Diagnosis present

## 2020-06-17 DIAGNOSIS — I129 Hypertensive chronic kidney disease with stage 1 through stage 4 chronic kidney disease, or unspecified chronic kidney disease: Secondary | ICD-10-CM | POA: Diagnosis present

## 2020-06-17 DIAGNOSIS — C7951 Secondary malignant neoplasm of bone: Secondary | ICD-10-CM | POA: Diagnosis present

## 2020-06-17 DIAGNOSIS — C50919 Malignant neoplasm of unspecified site of unspecified female breast: Secondary | ICD-10-CM | POA: Diagnosis not present

## 2020-06-17 DIAGNOSIS — Z85048 Personal history of other malignant neoplasm of rectum, rectosigmoid junction, and anus: Secondary | ICD-10-CM | POA: Diagnosis not present

## 2020-06-17 DIAGNOSIS — Z881 Allergy status to other antibiotic agents status: Secondary | ICD-10-CM

## 2020-06-17 DIAGNOSIS — Z66 Do not resuscitate: Secondary | ICD-10-CM | POA: Diagnosis not present

## 2020-06-17 DIAGNOSIS — R059 Cough, unspecified: Secondary | ICD-10-CM

## 2020-06-17 DIAGNOSIS — N1832 Chronic kidney disease, stage 3b: Secondary | ICD-10-CM | POA: Diagnosis present

## 2020-06-17 DIAGNOSIS — N390 Urinary tract infection, site not specified: Secondary | ICD-10-CM

## 2020-06-17 DIAGNOSIS — N139 Obstructive and reflux uropathy, unspecified: Secondary | ICD-10-CM

## 2020-06-17 DIAGNOSIS — Z9012 Acquired absence of left breast and nipple: Secondary | ICD-10-CM | POA: Diagnosis not present

## 2020-06-17 DIAGNOSIS — R600 Localized edema: Secondary | ICD-10-CM | POA: Diagnosis not present

## 2020-06-17 DIAGNOSIS — J90 Pleural effusion, not elsewhere classified: Secondary | ICD-10-CM | POA: Diagnosis not present

## 2020-06-17 DIAGNOSIS — Z1501 Genetic susceptibility to malignant neoplasm of breast: Secondary | ICD-10-CM

## 2020-06-17 DIAGNOSIS — R1032 Left lower quadrant pain: Secondary | ICD-10-CM | POA: Diagnosis not present

## 2020-06-17 DIAGNOSIS — Z79899 Other long term (current) drug therapy: Secondary | ICD-10-CM

## 2020-06-17 DIAGNOSIS — N189 Chronic kidney disease, unspecified: Secondary | ICD-10-CM | POA: Diagnosis not present

## 2020-06-17 DIAGNOSIS — M8000XK Age-related osteoporosis with current pathological fracture, unspecified site, subsequent encounter for fracture with nonunion: Secondary | ICD-10-CM

## 2020-06-17 DIAGNOSIS — C210 Malignant neoplasm of anus, unspecified: Secondary | ICD-10-CM

## 2020-06-17 DIAGNOSIS — Z933 Colostomy status: Secondary | ICD-10-CM

## 2020-06-17 DIAGNOSIS — F419 Anxiety disorder, unspecified: Secondary | ICD-10-CM | POA: Diagnosis present

## 2020-06-17 DIAGNOSIS — E440 Moderate protein-calorie malnutrition: Secondary | ICD-10-CM | POA: Diagnosis present

## 2020-06-17 DIAGNOSIS — Z888 Allergy status to other drugs, medicaments and biological substances status: Secondary | ICD-10-CM

## 2020-06-17 DIAGNOSIS — Z6820 Body mass index (BMI) 20.0-20.9, adult: Secondary | ICD-10-CM | POA: Diagnosis not present

## 2020-06-17 DIAGNOSIS — Z79811 Long term (current) use of aromatase inhibitors: Secondary | ICD-10-CM | POA: Diagnosis not present

## 2020-06-17 DIAGNOSIS — D539 Nutritional anemia, unspecified: Secondary | ICD-10-CM | POA: Diagnosis present

## 2020-06-17 DIAGNOSIS — K6389 Other specified diseases of intestine: Secondary | ICD-10-CM | POA: Diagnosis not present

## 2020-06-17 DIAGNOSIS — I2510 Atherosclerotic heart disease of native coronary artery without angina pectoris: Secondary | ICD-10-CM

## 2020-06-17 DIAGNOSIS — K219 Gastro-esophageal reflux disease without esophagitis: Secondary | ICD-10-CM | POA: Diagnosis present

## 2020-06-17 DIAGNOSIS — N178 Other acute kidney failure: Secondary | ICD-10-CM

## 2020-06-17 DIAGNOSIS — Z1509 Genetic susceptibility to other malignant neoplasm: Secondary | ICD-10-CM

## 2020-06-17 DIAGNOSIS — N132 Hydronephrosis with renal and ureteral calculous obstruction: Secondary | ICD-10-CM

## 2020-06-17 DIAGNOSIS — I700 Atherosclerosis of aorta: Secondary | ICD-10-CM | POA: Diagnosis not present

## 2020-06-17 DIAGNOSIS — C50812 Malignant neoplasm of overlapping sites of left female breast: Secondary | ICD-10-CM

## 2020-06-17 HISTORY — PX: IR NEPHROSTOMY PLACEMENT LEFT: IMG6063

## 2020-06-17 LAB — COMPREHENSIVE METABOLIC PANEL
ALT: 10 U/L (ref 0–44)
AST: 13 U/L — ABNORMAL LOW (ref 15–41)
Albumin: 3.4 g/dL — ABNORMAL LOW (ref 3.5–5.0)
Alkaline Phosphatase: 63 U/L (ref 38–126)
Anion gap: 9 (ref 5–15)
BUN: 46 mg/dL — ABNORMAL HIGH (ref 8–23)
CO2: 20 mmol/L — ABNORMAL LOW (ref 22–32)
Calcium: 8.4 mg/dL — ABNORMAL LOW (ref 8.9–10.3)
Chloride: 101 mmol/L (ref 98–111)
Creatinine, Ser: 6.87 mg/dL — ABNORMAL HIGH (ref 0.44–1.00)
GFR, Estimated: 6 mL/min — ABNORMAL LOW (ref >60)
Glucose, Bld: 88 mg/dL (ref 70–99)
Potassium: 6 mmol/L — ABNORMAL HIGH (ref 3.5–5.1)
Sodium: 130 mmol/L — ABNORMAL LOW (ref 135–145)
Total Bilirubin: 0.5 mg/dL (ref 0.3–1.2)
Total Protein: 7 g/dL (ref 6.5–8.1)

## 2020-06-17 LAB — PROTIME-INR
INR: 1.1 (ref 0.8–1.2)
Prothrombin Time: 13.9 seconds (ref 11.4–15.2)

## 2020-06-17 LAB — CBC WITH DIFFERENTIAL/PLATELET
Abs Immature Granulocytes: 0.05 10*3/uL (ref 0.00–0.07)
Basophils Absolute: 0 10*3/uL (ref 0.0–0.1)
Basophils Relative: 0 %
Eosinophils Absolute: 0.1 10*3/uL (ref 0.0–0.5)
Eosinophils Relative: 0 %
HCT: 30 % — ABNORMAL LOW (ref 36.0–46.0)
Hemoglobin: 9.4 g/dL — ABNORMAL LOW (ref 12.0–15.0)
Immature Granulocytes: 0 %
Lymphocytes Relative: 6 %
Lymphs Abs: 0.7 10*3/uL (ref 0.7–4.0)
MCH: 33.2 pg (ref 26.0–34.0)
MCHC: 31.3 g/dL (ref 30.0–36.0)
MCV: 106 fL — ABNORMAL HIGH (ref 80.0–100.0)
Monocytes Absolute: 0.6 10*3/uL (ref 0.1–1.0)
Monocytes Relative: 5 %
Neutro Abs: 10 10*3/uL — ABNORMAL HIGH (ref 1.7–7.7)
Neutrophils Relative %: 89 %
Platelets: 327 10*3/uL (ref 150–400)
RBC: 2.83 MIL/uL — ABNORMAL LOW (ref 3.87–5.11)
RDW: 12.1 % (ref 11.5–15.5)
WBC: 11.3 10*3/uL — ABNORMAL HIGH (ref 4.0–10.5)
nRBC: 0 % (ref 0.0–0.2)

## 2020-06-17 LAB — RESPIRATORY PANEL BY RT PCR (FLU A&B, COVID)
Influenza A by PCR: NEGATIVE
Influenza B by PCR: NEGATIVE
SARS Coronavirus 2 by RT PCR: NEGATIVE

## 2020-06-17 LAB — LIPASE, BLOOD: Lipase: 26 U/L (ref 11–51)

## 2020-06-17 MED ORDER — SODIUM BICARBONATE 8.4 % IV SOLN
INTRAVENOUS | Status: DC
  Filled 2020-06-17 (×2): qty 150

## 2020-06-17 MED ORDER — MIDAZOLAM HCL 2 MG/2ML IJ SOLN
INTRAMUSCULAR | Status: AC
  Filled 2020-06-17: qty 2

## 2020-06-17 MED ORDER — CLONAZEPAM 0.5 MG PO TABS: 0.5000 mg | ORAL_TABLET | Freq: Two times a day (BID) | ORAL | Status: DC | PRN

## 2020-06-17 MED ORDER — PANTOPRAZOLE SODIUM 40 MG PO TBEC
40.0000 mg | DELAYED_RELEASE_TABLET | Freq: Every day | ORAL | Status: DC
Start: 2020-06-17 — End: 2020-06-17

## 2020-06-17 MED ORDER — OXYCODONE HCL 5 MG PO TABS
5.0000 mg | ORAL_TABLET | ORAL | Status: DC | PRN
  Administered 2020-06-17: 5 mg via ORAL
  Filled 2020-06-17: qty 1

## 2020-06-17 MED ORDER — ONDANSETRON HCL 4 MG/2ML IJ SOLN
4.0000 mg | Freq: Four times a day (QID) | INTRAMUSCULAR | Status: DC | PRN
  Administered 2020-06-17 – 2020-06-19 (×2): 4 mg via INTRAVENOUS
  Filled 2020-06-17 (×2): qty 2

## 2020-06-17 MED ORDER — ACETAMINOPHEN 500 MG PO TABS
1000.0000 mg | ORAL_TABLET | Freq: Four times a day (QID) | ORAL | Status: DC | PRN
  Administered 2020-06-18 – 2020-06-21 (×6): 1000 mg via ORAL
  Filled 2020-06-17 (×6): qty 2

## 2020-06-17 MED ORDER — BUPROPION HCL ER (SR) 100 MG PO TB12
100.0000 mg | ORAL_TABLET | Freq: Two times a day (BID) | ORAL | Status: DC
  Administered 2020-06-17 – 2020-06-22 (×10): 100 mg via ORAL
  Filled 2020-06-17 (×12): qty 1

## 2020-06-17 MED ORDER — FENTANYL CITRATE (PF) 100 MCG/2ML IJ SOLN
INTRAMUSCULAR | Status: AC | PRN
Start: 2020-06-17 — End: 2020-06-17
  Administered 2020-06-17 (×2): 50 ug via INTRAVENOUS

## 2020-06-17 MED ORDER — VITAMIN B-12 1000 MCG PO TABS
1000.0000 ug | ORAL_TABLET | Freq: Two times a day (BID) | ORAL | Status: DC
  Administered 2020-06-17 – 2020-06-22 (×10): 1000 ug via ORAL
  Filled 2020-06-17 (×11): qty 1

## 2020-06-17 MED ORDER — SODIUM ZIRCONIUM CYCLOSILICATE 10 G PO PACK
10.0000 g | PACK | Freq: Once | ORAL | Status: AC
  Administered 2020-06-17: 10 g via ORAL
  Filled 2020-06-17: qty 1

## 2020-06-17 MED ORDER — SODIUM CHLORIDE 0.9 % IV BOLUS
500.0000 mL | Freq: Once | INTRAVENOUS | Status: AC
  Administered 2020-06-17: 500 mL via INTRAVENOUS

## 2020-06-17 MED ORDER — FENTANYL CITRATE (PF) 100 MCG/2ML IJ SOLN
12.5000 ug | INTRAMUSCULAR | Status: DC | PRN
  Administered 2020-06-17: 25 ug via INTRAVENOUS
  Filled 2020-06-17: qty 2

## 2020-06-17 MED ORDER — SODIUM CHLORIDE 0.9% FLUSH
5.0000 mL | Freq: Three times a day (TID) | INTRAVENOUS | Status: DC
  Administered 2020-06-18 – 2020-06-22 (×10): 5 mL

## 2020-06-17 MED ORDER — MORPHINE SULFATE (PF) 4 MG/ML IV SOLN
4.0000 mg | Freq: Once | INTRAVENOUS | Status: AC
  Administered 2020-06-17: 4 mg via INTRAVENOUS
  Filled 2020-06-17: qty 1

## 2020-06-17 MED ORDER — ONDANSETRON HCL 4 MG/2ML IJ SOLN
4.0000 mg | Freq: Once | INTRAMUSCULAR | Status: AC
  Administered 2020-06-17: 4 mg via INTRAVENOUS
  Filled 2020-06-17: qty 2

## 2020-06-17 MED ORDER — CEFAZOLIN SODIUM-DEXTROSE 2-4 GM/100ML-% IV SOLN
INTRAVENOUS | Status: AC
  Administered 2020-06-17: 2 g via INTRAVENOUS
  Filled 2020-06-17: qty 100

## 2020-06-17 MED ORDER — FENTANYL CITRATE (PF) 100 MCG/2ML IJ SOLN
INTRAMUSCULAR | Status: AC
  Filled 2020-06-17: qty 2

## 2020-06-17 MED ORDER — CEFAZOLIN SODIUM-DEXTROSE 2-4 GM/100ML-% IV SOLN: 2.0000 g | INTRAVENOUS | Status: AC

## 2020-06-17 MED ORDER — METOPROLOL SUCCINATE ER 50 MG PO TB24
25.0000 mg | ORAL_TABLET | Freq: Every day | ORAL | Status: DC
  Administered 2020-06-17 – 2020-06-20 (×4): 25 mg via ORAL
  Filled 2020-06-17 (×4): qty 1

## 2020-06-17 MED ORDER — LETROZOLE 2.5 MG PO TABS
2.5000 mg | ORAL_TABLET | Freq: Every day | ORAL | Status: DC
  Administered 2020-06-18 – 2020-06-19 (×2): 2.5 mg via ORAL
  Filled 2020-06-17 (×4): qty 1

## 2020-06-17 MED ORDER — MIDAZOLAM HCL 2 MG/2ML IJ SOLN
INTRAMUSCULAR | Status: AC | PRN
  Administered 2020-06-17 (×3): 1 mg via INTRAVENOUS

## 2020-06-17 MED ORDER — LIDOCAINE HCL (PF) 1 % IJ SOLN
INTRAMUSCULAR | Status: AC | PRN
  Administered 2020-06-17: 10 mL via INTRADERMAL

## 2020-06-17 MED ORDER — LIDOCAINE HCL 1 % IJ SOLN
INTRAMUSCULAR | Status: AC
  Filled 2020-06-17: qty 20

## 2020-06-17 MED ORDER — ACETAMINOPHEN 500 MG PO TABS: 1000.0000 mg | ORAL_TABLET | Freq: Four times a day (QID) | ORAL | Status: DC | PRN

## 2020-06-17 MED ORDER — HYDROMORPHONE BOLUS VIA INFUSION: 0.5000 mg | INTRAVENOUS | Status: DC | PRN

## 2020-06-17 MED ORDER — OXYCODONE HCL 5 MG PO TABS
5.0000 mg | ORAL_TABLET | ORAL | Status: DC | PRN
Start: 2020-06-17 — End: 2020-06-17

## 2020-06-17 MED ORDER — TAMSULOSIN HCL 0.4 MG PO CAPS
0.4000 mg | ORAL_CAPSULE | Freq: Two times a day (BID) | ORAL | Status: DC
  Administered 2020-06-17 – 2020-06-22 (×10): 0.4 mg via ORAL
  Filled 2020-06-17 (×10): qty 1

## 2020-06-17 MED ORDER — PANTOPRAZOLE SODIUM 40 MG PO TBEC
40.0000 mg | DELAYED_RELEASE_TABLET | Freq: Two times a day (BID) | ORAL | Status: DC
  Administered 2020-06-17 – 2020-06-22 (×10): 40 mg via ORAL
  Filled 2020-06-17 (×10): qty 1

## 2020-06-17 MED ORDER — POLYETHYLENE GLYCOL 3350 17 G PO PACK: 17.0000 g | PACK | Freq: Every day | ORAL | Status: DC | PRN

## 2020-06-17 MED ORDER — SODIUM CHLORIDE 0.9 % IV SOLN: INTRAVENOUS | Status: DC

## 2020-06-17 MED ORDER — IOHEXOL 300 MG/ML  SOLN
50.0000 mL | Freq: Once | INTRAMUSCULAR | Status: AC | PRN
  Administered 2020-06-17: 12 mL

## 2020-06-17 MED ORDER — ONDANSETRON HCL 4 MG PO TABS: 4.0000 mg | ORAL_TABLET | Freq: Four times a day (QID) | ORAL | Status: DC | PRN

## 2020-06-17 NOTE — H&P (Signed)
History and Physical    [name] JIR:[id number] DOB: 1950-10-13 DOA: 06/17/2020  PCP: Celene Squibb, MD Patient coming from: Home / [facility] ED  Chief Complaint: flank pain   HPI:  69 year old with a history of metastatic breast cancer currently on a chemo holiday, BRCA2 mutation, anxiety/depression, remote anal cancer, CKD stage IIIa, HTN, and recent admission for obstructive uropathy and acute kidney injury requiring bilateral ureteral stent placement who was discharged home 10/30.  Patient reported she was doing well until 4 days prior to this admission when she began to experience the recurrence of flank pain.  This pain only worsened despite use of narcotic pain medications and became associated with dry heaving and severe nausea.  She also appreciated decreased urinary output.  She spoke to her urologist office over the weekend and was told to present to the ED if she began to experience fevers.  Today when her symptoms persisted/worsened she decided to come to the ED for evaluation.  In the ED she was found to be suffering with severe acute renal failure, hyperkalemia, and renal ultrasound noted moderate bilateral hydronephrosis despite presence of ureteral stents.  Significant Events: 10/26 Bilateral stent placement 10/26 10/29 Left Stent exchange 10/29 10/30 d/c home  11/8 re-admit   Assessment/Plan  Recurrent obstructive uropathy Urology to evaluate in ED - anticipate need for percutaneous nephrostomy tubes therefore will hold on DVT prophy anticoag  Acute kidney failure on CKD stage IIIa Creatinine 2.81 at time of discharge 10/30 - should improve rapidly w/ correction of obstruction   Severe hyperkalemia Lokelma dosed while in ED - follow trend in serial fashion - initiate bicarb short-term and follow K+  Hyponatremia Volume resuscitate and follow trend  Metastatic breast cancer to bone Continue usual home therapies  History of anal cancer status post resection and  colostomy No acute complications   HTN BP poorly controlled, likely related to pain - monitor trend w/o change in usual meds for now   Chronic macrocytic anemia No evidence of signif blood loss - follow trend   Anxiety/depression  DVT prophylaxis: Subcutaneous heparin once nephrostomy tubes placed  Code Status: NO CODE BLUE Family Communication: no family present at time of exam  Disposition Plan: Admit to inpatient Consults called: Urology consulted by ED  Review of Systems: As per HPI otherwise 10 point review of systems negative.   Past Medical History:  Diagnosis Date  . Adenocarcinoma of breast ([place]) 01/27/2011   stage IIIc breast cancer  . Bladder infection   . Bone metastasis ([place]) 01/17/2016  . Borderline hypertension   . Breast cancer ([place])   . Closed fracture of humerus sept 2013  . Depression   . Family history of adverse reaction to anesthesia    father remembered his surgery  . Fatigue   . GERD (gastroesophageal reflux disease)   . Hypertension   . 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 Osteoporosis 01/27/2011  . Pneumonia   . Port catheter in place 04/14/2013  . Post-mastectomy lymphedema syndrome   . 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 Reflux   . Retinal detachment   . 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 Swollen lymph nodes   . URI (upper respiratory infection) 02/17/12  . Wears glasses     Past Surgical History:  Procedure Laterality Date  . ABDOMINAL PERINEAL BOWEL RESECTION N/A 09/01/2016   Procedure: ABDOMINAL PERINEAL  RESECTION PERMANENT COLOSTOMY;  Surgeon: Leighton Ruff, MD;  Location: [facility];  Service: General;  Laterality: N/A;  . biopsy of lymph node     super clavicle  . BTL    . CATARACT EXTRACTION    . cervical disc fusioni    . CYSTOSCOPY W/ URETERAL STENT PLACEMENT Bilateral 06/03/2020   Procedure: CYSTOSCOPY WITH RETROGRADE PYELOGRAM/URETERAL  STENT PLACEMENT bladder biopsy;  Surgeon: [name], MD;  Location: [facility];  Service: Urology;  Laterality: Bilateral;  . CYSTOSCOPY WITH STENT PLACEMENT Left 06/07/2020   Procedure: CYSTOSCOPY LEFT STENT EXCHANGE, retrograde pylogram;  Surgeon: [name], MD;  Location: [facility];  Service: Urology;  Laterality: Left;  . LYMPH NODE BIOPSY Right 06/19/2015   Procedure: RIGHT NECK LYMPH NODE EXCISION;  Surgeon: Rolm Bookbinder, MD;  Location: [place];  Service: General;  Laterality: Right;  . LYMPH NODE DISSECTION Left 04/26/2017   Procedure: EXCISION LEFT NECK LYMPH NODE;  Surgeon: Rolm Bookbinder, MD;  Location: [place];  Service: General;  Laterality: Left;  Marland Kitchen MASS EXCISION Left 07/27/2016   Procedure: EXCISION OF POSTERIOR NECK MASS;  Surgeon: Rolm Bookbinder, MD;  Location: [place];  Service: General;  Laterality: Left;  Marland Kitchen MASTECTOMY MODIFIED RADICAL     left breast  . Port- a-cath insertion    . retinal detachment and repair      Family History  Family History  Problem Relation Age of Onset  . Hypertension Mother   . Heart disease Mother        atrial fib  . Heart disease Father   . Other Father        glaucoma    Social History   reports that she has never smoked. She has never used smokeless tobacco. She reports current alcohol use of about 7.0 standard drinks of alcohol per week. She reports current drug use. Drug: Marijuana.  Allergies Allergies  Allergen Reactions  . Ciprofloxacin Other (See Comments)    tendonitis  . Prochlorperazine Anxiety    Other reaction(s): Confusion    Prior to Admission medications   Medication Sig Start Date End Date Taking? Authorizing Provider  acetaminophen (TYLENOL) 500 MG tablet Take 1,000 mg by mouth every 6 (six) hours as needed for moderate pain or headache.   Yes [provider]  buPROPion (WELLBUTRIN SR) 100 MG 12 hr tablet Take 100 mg by mouth 2 (two) times daily.    Yes [provider]  CALCIUM-VITAMIN D PO Take 1 tablet by mouth 2 (two) times daily.    Yes [provider]  clonazePAM (KLONOPIN) 0.5 MG tablet Take 0.5 mg by mouth 2 (two) times daily as needed for anxiety.   Yes [provider]  denosumab (XGEVA) 120 MG/1.7ML SOLN injection Inject 120 mg into the skin every 3 (three) months.   Yes [provider]  HYDROcodone-acetaminophen (NORCO/VICODIN) 5-325 MG tablet Take 1-2 tablets by mouth every 6 (six) hours as needed for moderate pain or severe pain.   Yes [provider]  letrozole (FEMARA) 2.5 MG tablet Take 2.5 mg by mouth daily.   Yes [provider]  metoprolol succinate (TOPROL-XL) 25 MG 24 hr tablet Take 25 mg by mouth in the morning and at bedtime.  04/23/16  Yes [provider]  omeprazole (PRILOSEC) 40 MG capsule Take 1 capsule (40 mg total) by mouth 2 (two) times daily. Patient taking differently: Take 40 mg by mouth in the morning and at bedtime.  03/19/16  Yes Holley Bouche, NP  potassium chloride (KLOR-CON) 10 MEQ tablet Take 20 mEq by mouth 2 (two) times daily.   Yes [provider]  tamsulosin (FLOMAX) 0.4 MG CAPS capsule Take 1 capsule (0.4 mg total) by mouth 2 (two) times daily. 06/08/20 07/08/20 Yes Gherghe, Vella Redhead, MD  vitamin B-12 (CYANOCOBALAMIN) 1000 MCG tablet Take 1,000 mcg by mouth 2 (two) times daily.    Yes [provider]    Physical Exam: Vitals:   06/17/20 1445 06/17/20 1500 06/17/20 1515 06/17/20 1530  BP:    (!) 157/78  Pulse: 93 83 (!) 40 90  Resp: 19 14 (!) 21 11  Temp:      TempSrc:      SpO2: 100% 100% (!) 61% 100%  Weight:      Height:        Constitutional: NAD, calm s/p fentanyl  ENMT: Mucous membranes are dry.  Neck: normal Respiratory: clear to auscultation bilaterally, no wheezing, no crackles. Normal respiratory effort. No accessory muscle use.  Cardiovascular: Regular rate and rhythm, no murmurs / rubs /  gallops. No extremity edema. 2+ pedal pulses. No carotid bruits.  Abdomen: no tenderness, no masses palpated. No hepatosplenomegaly. Bowel sounds positive. Ostomy in place L LQ Musculoskeletal: no clubbing / cyanosis. No joint deformity upper and lower extremities.  Skin: no rashes, lesions, ulcers. No induration Neurologic: CN 2-12 grossly intact. Sensation intact Psychiatric: Normal judgment and insight. Alert and oriented x 3.    Labs on Admission:   CBC: Recent Labs  Lab 06/17/20 1204  WBC 11.3*  NEUTROABS 10.0*  HGB 9.4*  HCT 30.0*  MCV 106.0*  PLT 094   Basic Metabolic Panel: Recent Labs  Lab 06/17/20 1204  NA 130*  K 6.0*  CL 101  CO2 20*  GLUCOSE 88  BUN 46*  CREATININE 6.87*  CALCIUM 8.4*   GFR: Estimated Creatinine Clearance: 7.2 mL/min (A) (by C-G formula based on SCr of 6.87 mg/dL (H)).   Liver Function Tests: Recent Labs  Lab 06/17/20 1204  AST 13*  ALT 10  ALKPHOS 63  BILITOT 0.5  PROT 7.0  ALBUMIN 3.4*   Recent Labs  Lab 06/17/20 1204  LIPASE 26   Urine analysis:    Component Value Date/Time   COLORURINE STRAW (A) 06/03/2020 1613   APPEARANCEUR CLEAR 06/03/2020 1613   LABSPEC 1.008 06/03/2020 1613   PHURINE 6.0 06/03/2020 1613   GLUCOSEU NEGATIVE 06/03/2020 1613   HGBUR NEGATIVE 06/03/2020 1613   BILIRUBINUR NEGATIVE 06/03/2020 1613   KETONESUR NEGATIVE 06/03/2020 1613   PROTEINUR NEGATIVE 06/03/2020 1613   UROBILINOGEN 0.2 08/06/2008 1500   NITRITE NEGATIVE 06/03/2020 1613   LEUKOCYTESUR NEGATIVE 06/03/2020 1613     Radiological Exams on Admission: DG Abdomen 1 View  Result Date: 06/17/2020 CLINICAL DATA:  Abdominal pain EXAM: ABDOMEN - 1 VIEW COMPARISON:  CT abdomen and pelvis June 06, 2020. FINDINGS: Double-J stents are present bilaterally, stable. Ostomy left mid abdomen, unchanged. No abnormal calcifications demonstrable. No bowel dilatation or air-fluid level to suggest bowel obstruction. No free air on supine  examination. IMPRESSION: Stable appearing double-J stents, unchanged in positions. Ostomy left mid abdomen. No abnormal calcification. No bowel obstruction or free air evident. Electronically Signed   By: Lowella Grip III M.D.   On: 06/17/2020 13:42   US Renal  Result Date: 06/17/2020 CLINICAL DATA:  Progressive flank region pain EXAM: RENAL / URINARY TRACT ULTRASOUND COMPLETE COMPARISON:  Renal ultrasound June 05, 2020; CT  abdomen and pelvis June 06, 2020 FINDINGS: Right Kidney: Renal measurements: 11.0 x 6.0 x 7.0 cm = volume: 267 mL. Echogenicity and renal cortical thickness are within normal limits. No mass or perinephric fluid visualized. Scarring lateral right kidney noted. There is moderate hydronephrosis on the right. A stent is present extending from the right renal pelvis extending to the bladder. Left Kidney: Renal measurements: 11.5 x 6.5 x 5.5 cm = volume: 215 mL. Echogenicity and renal cortical thickness are within normal limits. No mass or perinephric fluid visualized. Moderate hydronephrosis. Double-J stent extending from left renal pelvis to the bladder level. Bladder: Double-J stents in bladder.  Bladder virtually empty. Other: None. IMPRESSION: Double-J stents present bilaterally. Despite presence of stents, moderate hydronephrosis is present on each side. Scarring lateral right kidney. Study otherwise unremarkable. Electronically Signed   By: Lowella Grip III M.D.   On: 06/17/2020 13:47   CT Renal Stone Study  Result Date: 06/17/2020 CLINICAL DATA:  Follow-up hydronephrosis. Worsening left flank pain. EXAM: CT ABDOMEN AND PELVIS WITHOUT CONTRAST TECHNIQUE: Multidetector CT imaging of the abdomen and pelvis was performed following the standard protocol without IV contrast. COMPARISON:  06/06/2020 FINDINGS: Lower chest: Resolution of left effusion. Abnormal interstitial pulmonary density persists. Hepatobiliary: Normal without contrast. Pancreas: Normal Spleen: Normal  Adrenals/Urinary Tract: Adrenal glands are normal. Bilateral ureteral stents remain in place, with the ends in the renal collecting system and bladder. Considerable reduction in perinephric edema previously seen on the left. Moderate fullness of the left renal collecting system does persist. Mild fullness of the right renal collecting system persists. Stomach/Bowel: Previous colostomy and distal colectomy. Soft tissue thickening in the presacral soft tissues appears unchanged. Vascular/Lymphatic: Aortic atherosclerosis. No aneurysm. IVC is normal. No retroperitoneal adenopathy is seen. Reproductive: Previous hysterectomy. Other: No free fluid or air. Musculoskeletal: Scattered osseous metastatic lesions are not significantly changed since the recent examinations. IMPRESSION: 1. Bilateral ureteral stents remain in place, with the ends in the renal collecting systems and bladder. Considerable reduction in perinephric edema previously seen on the left. Moderate fullness of the left renal collecting system does persist. Mild fullness of the right renal collecting system persists. 2. Previous colostomy and distal colectomy. Soft tissue thickening in the presacral soft tissues appears unchanged. Scattered osseous metastatic lesions are not significantly changed. 3. Resolution of left effusion. Abnormal pulmonary interstitial pulmonary density persists. 4. Aortic atherosclerosis. Aortic Atherosclerosis (ICD10-I70.0). Electronically Signed   By: Nelson Chimes M.D.   On: 06/17/2020 15:09    Cherene Altes, MD [facility] Office  [phone] Pager - Text Page per Amion as per below:  On-Call/Text Page:      [email or web address]  If 7PM-7AM, please contact night-coverage [email or web address] 06/17/2020, 3:46 PM

## 2020-06-17 NOTE — ED Notes (Signed)
US at bedside

## 2020-06-17 NOTE — ED Provider Notes (Addendum)
[facility] DEPT Provider Note   CSN: [id number] Arrival date & time: 06/17/20  9379     History Chief Complaint  Patient presents with  . Abdominal Pain    ureteral stent    [name] is a 69 y.o. female.  HPI    Patient presents to the ED for evaluation of persistent left flank pain.  Patient was recently admitted to the hospital October 25.  She was seen by Dr. Louis Meckel while she was in the hospital.  Patient was noted to have acute kidney injury due to obstructive uropathy.  Patient ended up receiving ureteral stents.  She had worsening flank pain while she was in the hospital and had concerns for stent malfunction so she had an nephrostomy tube exchange of the left performed on October 29.  Patient's renal function improved and she was discharged.  Patient states since Friday she started having increasing pain.  She has been taking hydrocodone every 4 hours just to help alleviate the pain.  She took her most recent dose this morning.  Patient's had been having dry heaving and nausea.  She is also noticed decreased urine output.  She contacted the urologist over the weekend they told her to go to the emergency room if she started having any fevers.  Patient symptoms were worsening this morning so she was brought to the ED.  Family did contact urologist office to let them know she was coming to the ED.  Past Medical History:  Diagnosis Date  . 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 Bladder infection   . Bone metastasis ([place]) 01/17/2016  . Borderline hypertension   . Breast cancer ([place])   . Closed fracture of humerus sept 2013  . Depression   . Family history of adverse reaction to anesthesia    father remembered his surgery  . Fatigue   . GERD (gastroesophageal reflux disease)   . Hypertension   . 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 Osteoporosis 01/27/2011  . Pneumonia   .  Port catheter in place 04/14/2013  . Post-mastectomy lymphedema syndrome   . 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 Reflux   . Retinal detachment   . 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 Swollen lymph nodes   . URI (upper respiratory infection) 02/17/12  . Wears glasses     Patient Active Problem List   Diagnosis Date Noted  . Malignant neoplasm of overlapping sites of left breast in female, estrogen receptor positive ([place]) 06/09/2020  . Hydronephrosis 06/04/2020  . AKI (acute kidney injury) ([place]) 06/03/2020  . Bilateral hydronephrosis 06/03/2020  . Acute bilateral obstructive uropathy 06/03/2020  . Acute renal failure (ARF) ([place]) 06/03/2020  . 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 Coronary artery calcification seen on CT scan 03/22/2016  . 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 Bone metastasis ([place]) 01/17/2016  . 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 BRCA2 positive 12/16/2015  . Port catheter in place 04/14/2013  . Adenocarcinoma of left breast 01/27/2011  . Osteoporosis 01/27/2011    Past Surgical History:  Procedure Laterality Date  . ABDOMINAL PERINEAL BOWEL RESECTION N/A 09/01/2016   Procedure: ABDOMINAL PERINEAL RESECTION PERMANENT COLOSTOMY;  Surgeon: Leighton Ruff, MD;  Location: [facility];  Service: General;  Laterality: N/A;  . biopsy of lymph node     super clavicle  .  BTL    . CATARACT EXTRACTION    . cervical disc fusioni    . CYSTOSCOPY W/ URETERAL STENT PLACEMENT Bilateral 06/03/2020   Procedure: CYSTOSCOPY WITH RETROGRADE PYELOGRAM/URETERAL STENT PLACEMENT bladder biopsy;  Surgeon: [name], MD;  Location: [facility];  Service: Urology;  Laterality: Bilateral;  . CYSTOSCOPY WITH STENT PLACEMENT Left 06/07/2020   Procedure: CYSTOSCOPY LEFT STENT EXCHANGE, retrograde pylogram;  Surgeon: [name], MD;  Location: [facility];  Service: Urology;  Laterality: Left;  . LYMPH NODE BIOPSY Right 06/19/2015    Procedure: RIGHT NECK LYMPH NODE EXCISION;  Surgeon: Rolm Bookbinder, MD;  Location: [facility];  Service: General;  Laterality: Right;  . LYMPH NODE DISSECTION Left 04/26/2017   Procedure: EXCISION LEFT NECK LYMPH NODE;  Surgeon: Rolm Bookbinder, MD;  Location: [facility];  Service: General;  Laterality: Left;  Marland Kitchen MASS EXCISION Left 07/27/2016   Procedure: EXCISION OF POSTERIOR NECK MASS;  Surgeon: Rolm Bookbinder, MD;  Location: [facility];  Service: General;  Laterality: Left;  Marland Kitchen MASTECTOMY MODIFIED RADICAL     left breast  . Port- a-cath insertion    . retinal detachment and repair       OB History   No obstetric history on file.     Family History  Problem Relation Age of Onset  . Hypertension Mother   . Heart disease Mother        atrial fib  . Heart disease Father   . Other Father        glaucoma    Social History   Tobacco Use  . Smoking status: Never Smoker  . Smokeless tobacco: Never Used  Vaping Use  . Vaping Use: Never used  Substance Use Topics  . Alcohol use: Yes    Alcohol/week: 7.0 standard drinks    Types: 7 Glasses of wine per week    Comment: 1 drink daily  . Drug use: Yes    Types: Marijuana    Comment: During chemo, none over 1 Year    Home Medications Prior to Admission medications   Medication Sig Start Date End Date Taking? Authorizing Provider  acetaminophen (TYLENOL) 500 MG tablet Take 1,000 mg by mouth every 6 (six) hours as needed for moderate pain or headache.   Yes [provider]  buPROPion (WELLBUTRIN SR) 100 MG 12 hr tablet Take 100 mg by mouth 2 (two) times daily.   Yes [provider]  CALCIUM-VITAMIN D PO Take 1 tablet by mouth 2 (two) times daily.    Yes [provider]  clonazePAM (KLONOPIN) 0.5 MG tablet Take 0.5 mg by mouth 2 (two) times daily as needed for anxiety.   Yes [provider]  denosumab (XGEVA) 120 MG/1.7ML SOLN injection Inject 120 mg into the skin  every 3 (three) months.   Yes [provider]  HYDROcodone-acetaminophen (NORCO/VICODIN) 5-325 MG tablet Take 1-2 tablets by mouth every 6 (six) hours as needed for moderate pain or severe pain.   Yes [provider]  letrozole (FEMARA) 2.5 MG tablet Take 2.5 mg by mouth daily.   Yes [provider]  metoprolol succinate (TOPROL-XL) 25 MG 24 hr tablet Take 25 mg by mouth in the morning and at bedtime.  04/23/16  Yes [provider]  omeprazole (PRILOSEC) 40 MG capsule Take 1 capsule (40 mg total) by mouth 2 (two) times daily. Patient taking differently: Take 40 mg by mouth in the morning and at bedtime.  03/19/16  Yes Holley Bouche, NP  potassium chloride (KLOR-CON) 10 MEQ tablet Take 20 mEq by mouth 2 (two) times daily.   Yes [provider]  tamsulosin (FLOMAX) 0.4 MG CAPS capsule Take 1 capsule (0.4 mg total) by mouth 2 (two) times daily. 06/08/20 07/08/20 Yes Gherghe, Vella Redhead, MD  vitamin B-12 (CYANOCOBALAMIN) 1000 MCG tablet Take 1,000 mcg by mouth 2 (two) times daily.    Yes [provider]    Allergies    Ciprofloxacin and Prochlorperazine  Review of Systems   Review of Systems  All other systems reviewed and are negative.   Physical Exam Updated Vital Signs BP (!) 157/78   Pulse 90   Temp 98.7 F (37.1 C) (Oral)   Resp 11   Ht 1.651 m (_0 )   Wt 59.8 kg   SpO2 100%   BMI 21.94 kg/m   Physical Exam Vitals and nursing note reviewed.  Constitutional:      General: She is not in acute distress.    Appearance: She is well-developed.  HENT:     Head: Normocephalic and atraumatic.     Right Ear: External ear normal.     Left Ear: External ear normal.  Eyes:     General: No scleral icterus.       Right eye: No discharge.        Left eye: No discharge.     Conjunctiva/sclera: Conjunctivae normal.  Neck:     Trachea: No tracheal deviation.  Cardiovascular:     Rate and Rhythm: Normal rate and regular rhythm.    Pulmonary:     Effort: Pulmonary effort is normal. No respiratory distress.     Breath sounds: Normal breath sounds. No stridor. No wheezing or rales.  Abdominal:     General: Bowel sounds are normal. There is no distension.     Palpations: Abdomen is soft.     Tenderness: There is no abdominal tenderness. There is left CVA tenderness. There is no guarding or rebound.  Musculoskeletal:        General: No tenderness.     Cervical back: Neck supple.  Skin:    General: Skin is warm and dry.     Findings: No rash.  Neurological:     Mental Status: She is alert.     Cranial Nerves: No cranial nerve deficit (no facial droop, extraocular movements intact, no slurred speech).     Sensory: No sensory deficit.     Motor: No abnormal muscle tone or seizure activity.     Coordination: Coordination normal.     ED Results / Procedures / Treatments   Labs (all labs ordered are listed, but only abnormal results are displayed) Labs Reviewed  COMPREHENSIVE METABOLIC PANEL - Abnormal; Notable for the following components:      Result Value   Sodium 130 (*)    Potassium 6.0 (*)    CO2 20 (*)    BUN 46 (*)    Creatinine, Ser 6.87 (*)    Calcium 8.4 (*)    Albumin 3.4 (*)    AST 13 (*)    GFR, Estimated 6 (*)    All other components within normal limits  CBC WITH DIFFERENTIAL/PLATELET - Abnormal; Notable for the following components:   WBC 11.3 (*)    RBC 2.83 (*)    Hemoglobin 9.4 (*)    HCT 30.0 (*)    MCV 106.0 (*)    Neutro Abs 10.0 (*)    All other  components within normal limits  RESPIRATORY PANEL BY RT PCR (FLU A&B, COVID)  LIPASE, BLOOD  URINALYSIS, ROUTINE W REFLEX MICROSCOPIC    EKG None  Radiology DG Abdomen 1 View  Result Date: 06/17/2020 CLINICAL DATA:  Abdominal pain EXAM: ABDOMEN - 1 VIEW COMPARISON:  CT abdomen and pelvis June 06, 2020. FINDINGS: Double-J stents are present bilaterally, stable. Ostomy left mid abdomen, unchanged. No abnormal calcifications  demonstrable. No bowel dilatation or air-fluid level to suggest bowel obstruction. No free air on supine examination. IMPRESSION: Stable appearing double-J stents, unchanged in positions. Ostomy left mid abdomen. No abnormal calcification. No bowel obstruction or free air evident. Electronically Signed   By: Lowella Grip III M.D.   On: 06/17/2020 13:42   US Renal  Result Date: 06/17/2020 CLINICAL DATA:  Progressive flank region pain EXAM: RENAL / URINARY TRACT ULTRASOUND COMPLETE COMPARISON:  Renal ultrasound June 05, 2020; CT abdomen and pelvis June 06, 2020 FINDINGS: Right Kidney: Renal measurements: 11.0 x 6.0 x 7.0 cm = volume: 267 mL. Echogenicity and renal cortical thickness are within normal limits. No mass or perinephric fluid visualized. Scarring lateral right kidney noted. There is moderate hydronephrosis on the right. A stent is present extending from the right renal pelvis extending to the bladder. Left Kidney: Renal measurements: 11.5 x 6.5 x 5.5 cm = volume: 215 mL. Echogenicity and renal cortical thickness are within normal limits. No mass or perinephric fluid visualized. Moderate hydronephrosis. Double-J stent extending from left renal pelvis to the bladder level. Bladder: Double-J stents in bladder.  Bladder virtually empty. Other: None. IMPRESSION: Double-J stents present bilaterally. Despite presence of stents, moderate hydronephrosis is present on each side. Scarring lateral right kidney. Study otherwise unremarkable. Electronically Signed   By: Lowella Grip III M.D.   On: 06/17/2020 13:47   CT Renal Stone Study  Result Date: 06/17/2020 CLINICAL DATA:  Follow-up hydronephrosis. Worsening left flank pain. EXAM: CT ABDOMEN AND PELVIS WITHOUT CONTRAST TECHNIQUE: Multidetector CT imaging of the abdomen and pelvis was performed following the standard protocol without IV contrast. COMPARISON:  06/06/2020 FINDINGS: Lower chest: Resolution of left effusion. Abnormal interstitial  pulmonary density persists. Hepatobiliary: Normal without contrast. Pancreas: Normal Spleen: Normal Adrenals/Urinary Tract: Adrenal glands are normal. Bilateral ureteral stents remain in place, with the ends in the renal collecting system and bladder. Considerable reduction in perinephric edema previously seen on the left. Moderate fullness of the left renal collecting system does persist. Mild fullness of the right renal collecting system persists. Stomach/Bowel: Previous colostomy and distal colectomy. Soft tissue thickening in the presacral soft tissues appears unchanged. Vascular/Lymphatic: Aortic atherosclerosis. No aneurysm. IVC is normal. No retroperitoneal adenopathy is seen. Reproductive: Previous hysterectomy. Other: No free fluid or air. Musculoskeletal: Scattered osseous metastatic lesions are not significantly changed since the recent examinations. IMPRESSION: 1. Bilateral ureteral stents remain in place, with the ends in the renal collecting systems and bladder. Considerable reduction in perinephric edema previously seen on the left. Moderate fullness of the left renal collecting system does persist. Mild fullness of the right renal collecting system persists. 2. Previous colostomy and distal colectomy. Soft tissue thickening in the presacral soft tissues appears unchanged. Scattered osseous metastatic lesions are not significantly changed. 3. Resolution of left effusion. Abnormal pulmonary interstitial pulmonary density persists. 4. Aortic atherosclerosis. Aortic Atherosclerosis (ICD10-I70.0). Electronically Signed   By: Nelson Chimes M.D.   On: 06/17/2020 15:09    Procedures .Critical Care Performed by: Dorie Rank, MD Authorized by: Dorie Rank, MD   Critical  care provider statement:    Critical care time (minutes):  45   Critical care was time spent personally by me on the following activities:  Discussions with consultants, evaluation of patient's response to treatment, examination of  patient, ordering and performing treatments and interventions, ordering and review of laboratory studies, ordering and review of radiographic studies, pulse oximetry, re-evaluation of patient's condition, obtaining history from patient or surrogate and review of old charts   (including critical care time)  Medications Ordered in ED Medications  sodium chloride 0.9 % bolus 500 mL ( Intravenous Stopped 06/17/20 1331)    And  0.9 %  sodium chloride infusion ( Intravenous Rate/Dose Verify 06/17/20 1420)  sodium zirconium cyclosilicate (LOKELMA) packet 10 g (has no administration in time range)  morphine 4 MG/ML injection 4 mg (4 mg Intravenous Given 06/17/20 1231)  ondansetron (ZOFRAN) injection 4 mg (4 mg Intravenous Given 06/17/20 1231)  morphine 4 MG/ML injection 4 mg (4 mg Intravenous Given 06/17/20 1425)    ED Course  I have reviewed the triage vital signs and the nursing notes.  Pertinent labs & imaging results that were available during my care of the patient were reviewed by me and considered in my medical decision making (see chart for details).  Clinical Course as of Jun 18 1543  Mon Jun 17, 2020  1410 Patient's renal function has worsened   [JK]  1410 Ultrasound shows persistent hydronephrosis despite stents   [JK]  1415 Discussed case with Dr Lovena Neighbours.  Recommends repeat CT scan.  Will consult on pt.   Agrees with medical admission.   [MB]  8675 Discussed with Dr Thereasa Solo   [JK]  1544 Dose of lokelma ordered for hyper k   [JK]    Clinical Course User Index [JK] Dorie Rank, MD   MDM Rules/Calculators/A&P                          Patient presented to the ED for evaluation of recurrent abdominal and flank pain and decreased urine output.  Patient was recently in the hospital for acute kidney injury associated with hydronephrosis.  Patient's laboratory test today do show recurrent worsening renal function.  Her creatinine is significantly worsening compared to her discharge  creatinine.  Patient also has persistent bilateral hydronephrosis despite ureteral stents.  Bladder scan shows an empty bladder so there is no evidence of bladder outlet obstruction.  Case discussed with urology who will consult on the patient.  We will consult the medical service for admission and further treatment. Final Clinical Impression(s) / ED Diagnoses Final diagnoses:  Acute renal failure, unspecified acute renal failure type ([place])  Hydronephrosis, unspecified hydronephrosis type      Dorie Rank, MD 06/17/20 1425    Dorie Rank, MD 06/17/20 1544

## 2020-06-17 NOTE — ED Notes (Signed)
Bladder scan pt 3x scanned show 35ml

## 2020-06-17 NOTE — ED Triage Notes (Signed)
Pt reports to ED c/o increasing pain in left side/flank since Friday despite hydrocodone q4. Usually localized. Pt sts pain is similar to previous ureteral stent failure. Took most recent dose of norco 5/325 at 0800. Dry heaving last night, took zofran around 0700. Pt states she drank 16oz of water last night with little urination.

## 2020-06-17 NOTE — Procedures (Signed)
Interventional Radiology Procedure Note  Procedure: Left percutaneous nephrostomy tube placement  Complications: None  Estimated Blood Loss: < 10 mL  Findings: Left sided 10 Fr PCN placed via interpolar access and formed in renal pelvis. Draining urine into gravity bag.  Venetia Night. Kathlene Cote, M.D Pager:  [phone]

## 2020-06-17 NOTE — Progress Notes (Addendum)
Referring Physician(s): [place]  Supervising Physician: Aletta Edouard  Patient Status: [facility] ED  Chief Complaint: Left flank pain   Subjective: Patient familiar to IR service from left supraclavicular lymph node biopsy in 2015.  She is a 69 year old female with history of advanced breast cancer and acute renal failure/obstructive bilateral hydronephrosis.  She underwent bilateral ureteral stent placements and bladder biopsy on 06/03/2020.  Postop day #1 she developed severe left flank pain and underwent exchange of left ureteral stent on 06/07/2020.  CT scan at the time revealed calyceal rupture and stranding around the left kidney with worsening hydronephrosis.  She now presents to [facility] long ED with worsening left flank pain, occasional nausea and CT findings of:  1. Bilateral ureteral stents remain in place, with the ends in the renal collecting systems and bladder. Considerable reduction in perinephric edema previously seen on the left. Moderate fullness of the left renal collecting system does persist. Mild fullness of the right renal collecting system persists. 2. Previous colostomy and distal colectomy. Soft tissue thickening in the presacral soft tissues appears unchanged. Scattered osseous metastatic lesions are not significantly changed. 3. Resolution of left effusion. Abnormal pulmonary interstitial pulmonary density persists. 4. Aortic atherosclerosis.  She is currently afebrile, WBC 11.3, hemoglobin 9.4, platelets normal, potassium 6, creatinine 6.87 up from 2.81.  COVID-19 negative.  Request now received from urology for left percutaneous nephrostomy.  She currently denies headache, chest pain, worsening dyspnea, cough, vomiting.  She does have left lower abdominal/flank discomfort and occasional nausea with some blood-tinged urine.  Additional medical history as below.   Past Medical History:  Diagnosis Date  . 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 Bladder infection   . Bone metastasis ([place]) 01/17/2016  . Borderline hypertension   . Breast cancer ([place])   . Closed fracture of humerus sept 2013  . Depression   . Family history of adverse reaction to anesthesia    father remembered his surgery  . Fatigue   . GERD (gastroesophageal reflux disease)   . Hypertension   . 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 Osteoporosis 01/27/2011  . Pneumonia   . Port catheter in place 04/14/2013  . Post-mastectomy lymphedema syndrome   . 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 Reflux   . Retinal detachment   . 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 Swollen lymph nodes   . URI (upper respiratory infection) 02/17/12  . Wears glasses    Past Surgical History:  Procedure Laterality Date  . ABDOMINAL PERINEAL BOWEL RESECTION N/A 09/01/2016   Procedure: ABDOMINAL PERINEAL RESECTION PERMANENT COLOSTOMY;  Surgeon: Leighton Ruff, MD;  Location: [facility];  Service: General;  Laterality: N/A;  . biopsy of lymph node     super clavicle  . BTL    . CATARACT EXTRACTION    . cervical disc fusioni    . CYSTOSCOPY W/ URETERAL STENT PLACEMENT Bilateral 06/03/2020   Procedure: CYSTOSCOPY WITH RETROGRADE PYELOGRAM/URETERAL STENT PLACEMENT bladder biopsy;  Surgeon: [name], MD;  Location: [facility];  Service: Urology;  Laterality: Bilateral;  . CYSTOSCOPY WITH STENT PLACEMENT Left 06/07/2020   Procedure: CYSTOSCOPY LEFT STENT EXCHANGE, retrograde pylogram;  Surgeon: [name], MD;  Location: [facility];  Service: Urology;  Laterality: Left;  . LYMPH NODE BIOPSY Right 06/19/2015   Procedure: RIGHT NECK LYMPH NODE EXCISION;  Surgeon:  Rolm Bookbinder, MD;  Location: [place];  Service: General;  Laterality: Right;  . LYMPH NODE DISSECTION Left 04/26/2017   Procedure: EXCISION LEFT NECK LYMPH NODE;  Surgeon: Rolm Bookbinder, MD;   Location: [place];  Service: General;  Laterality: Left;  Marland Kitchen MASS EXCISION Left 07/27/2016   Procedure: EXCISION OF POSTERIOR NECK MASS;  Surgeon: Rolm Bookbinder, MD;  Location: [place];  Service: General;  Laterality: Left;  Marland Kitchen MASTECTOMY MODIFIED RADICAL     left breast  . Port- a-cath insertion    . retinal detachment and repair        Allergies: Ciprofloxacin and Prochlorperazine  Medications: Prior to Admission medications   Medication Sig Start Date End Date Taking? Authorizing Provider  acetaminophen (TYLENOL) 500 MG tablet Take 1,000 mg by mouth every 6 (six) hours as needed for moderate pain or headache.   Yes [provider]  buPROPion (WELLBUTRIN SR) 100 MG 12 hr tablet Take 100 mg by mouth 2 (two) times daily.   Yes [provider]  CALCIUM-VITAMIN D PO Take 1 tablet by mouth 2 (two) times daily.    Yes [provider]  clonazePAM (KLONOPIN) 0.5 MG tablet Take 0.5 mg by mouth 2 (two) times daily as needed for anxiety.   Yes [provider]  denosumab (XGEVA) 120 MG/1.7ML SOLN injection Inject 120 mg into the skin every 3 (three) months.   Yes [provider]  HYDROcodone-acetaminophen (NORCO/VICODIN) 5-325 MG tablet Take 1-2 tablets by mouth every 6 (six) hours as needed for moderate pain or severe pain.   Yes [provider]  letrozole (FEMARA) 2.5 MG tablet Take 2.5 mg by mouth daily.   Yes [provider]  metoprolol succinate (TOPROL-XL) 25 MG 24 hr tablet Take 25 mg by mouth in the morning and at bedtime.  04/23/16  Yes [provider]  omeprazole (PRILOSEC) 40 MG capsule Take 1 capsule (40 mg total) by mouth 2 (two) times daily. Patient taking differently: Take 40 mg by mouth in the morning and at bedtime.  03/19/16  Yes Holley Bouche, NP  potassium chloride (KLOR-CON) 10 MEQ tablet Take 20 mEq by mouth 2 (two) times daily.   Yes [provider]  tamsulosin (FLOMAX) 0.4  MG CAPS capsule Take 1 capsule (0.4 mg total) by mouth 2 (two) times daily. 06/08/20 07/08/20 Yes Gherghe, Vella Redhead, MD  vitamin B-12 (CYANOCOBALAMIN) 1000 MCG tablet Take 1,000 mcg by mouth 2 (two) times daily.    Yes [provider]     Vital Signs: BP (!) 156/74   Pulse 89   Temp 98.7 F (37.1 C) (Oral)   Resp 18   Ht 5\' 5"  (1.651 m)   Wt 131 lb 13.4 oz (59.8 kg)   SpO2 100%   BMI 21.94 kg/m   Physical Exam patient awake, alert.  Chest clear to auscultation bilaterally.  Clean, intact right chest wall Port-A-Cath.  Heart with slightly tachycardic but regular rhythm.  Abdomen soft, positive bowel sounds, left lower abdominal/left flank tenderness to palpation. No lower extremity edema. Imaging: DG Abdomen 1 View  Result Date: 06/17/2020 CLINICAL DATA:  Abdominal pain EXAM: ABDOMEN - 1 VIEW COMPARISON:  CT abdomen and pelvis June 06, 2020. FINDINGS: Double-J stents are present bilaterally, stable. Ostomy left mid abdomen, unchanged. No abnormal calcifications demonstrable. No bowel dilatation or air-fluid level to suggest bowel obstruction. No free air on supine examination. IMPRESSION: Stable appearing double-J stents, unchanged in positions. Ostomy left  mid abdomen. No abnormal calcification. No bowel obstruction or free air evident. Electronically Signed   By: Lowella Grip III M.D.   On: 06/17/2020 13:42   US Renal  Result Date: 06/17/2020 CLINICAL DATA:  Progressive flank region pain EXAM: RENAL / URINARY TRACT ULTRASOUND COMPLETE COMPARISON:  Renal ultrasound June 05, 2020; CT abdomen and pelvis June 06, 2020 FINDINGS: Right Kidney: Renal measurements: 11.0 x 6.0 x 7.0 cm = volume: 267 mL. Echogenicity and renal cortical thickness are within normal limits. No mass or perinephric fluid visualized. Scarring lateral right kidney noted. There is moderate hydronephrosis on the right. A stent is present extending from the right renal pelvis extending to the bladder.  Left Kidney: Renal measurements: 11.5 x 6.5 x 5.5 cm = volume: 215 mL. Echogenicity and renal cortical thickness are within normal limits. No mass or perinephric fluid visualized. Moderate hydronephrosis. Double-J stent extending from left renal pelvis to the bladder level. Bladder: Double-J stents in bladder.  Bladder virtually empty. Other: None. IMPRESSION: Double-J stents present bilaterally. Despite presence of stents, moderate hydronephrosis is present on each side. Scarring lateral right kidney. Study otherwise unremarkable. Electronically Signed   By: Lowella Grip III M.D.   On: 06/17/2020 13:47   CT Renal Stone Study  Result Date: 06/17/2020 CLINICAL DATA:  Follow-up hydronephrosis. Worsening left flank pain. EXAM: CT ABDOMEN AND PELVIS WITHOUT CONTRAST TECHNIQUE: Multidetector CT imaging of the abdomen and pelvis was performed following the standard protocol without IV contrast. COMPARISON:  06/06/2020 FINDINGS: Lower chest: Resolution of left effusion. Abnormal interstitial pulmonary density persists. Hepatobiliary: Normal without contrast. Pancreas: Normal Spleen: Normal Adrenals/Urinary Tract: Adrenal glands are normal. Bilateral ureteral stents remain in place, with the ends in the renal collecting system and bladder. Considerable reduction in perinephric edema previously seen on the left. Moderate fullness of the left renal collecting system does persist. Mild fullness of the right renal collecting system persists. Stomach/Bowel: Previous colostomy and distal colectomy. Soft tissue thickening in the presacral soft tissues appears unchanged. Vascular/Lymphatic: Aortic atherosclerosis. No aneurysm. IVC is normal. No retroperitoneal adenopathy is seen. Reproductive: Previous hysterectomy. Other: No free fluid or air. Musculoskeletal: Scattered osseous metastatic lesions are not significantly changed since the recent examinations. IMPRESSION: 1. Bilateral ureteral stents remain in place, with the  ends in the renal collecting systems and bladder. Considerable reduction in perinephric edema previously seen on the left. Moderate fullness of the left renal collecting system does persist. Mild fullness of the right renal collecting system persists. 2. Previous colostomy and distal colectomy. Soft tissue thickening in the presacral soft tissues appears unchanged. Scattered osseous metastatic lesions are not significantly changed. 3. Resolution of left effusion. Abnormal pulmonary interstitial pulmonary density persists. 4. Aortic atherosclerosis. Aortic Atherosclerosis (ICD10-I70.0). Electronically Signed   By: Nelson Chimes M.D.   On: 06/17/2020 15:09    Labs:  CBC: Recent Labs    06/05/20 0529 06/07/20 0600 06/08/20 0520 06/17/20 1204  WBC 8.6 12.7* 10.6* 11.3*  HGB 10.8* 9.1* 8.5* 9.4*  HCT 32.8* 27.1* 25.6* 30.0*  PLT 272 228 215 327    COAGS: No results for input(s): INR, APTT in the last 8760 hours.  BMP: Recent Labs    06/06/20 0559 06/07/20 0600 06/08/20 0520 06/17/20 1204  NA 139 136 140 130*  K 3.4* 3.2* 3.6 6.0*  CL 107 106 111 101  CO2 20* 19* 20* 20*  GLUCOSE 120* 96 105* 88  BUN 39* 37* 37* 46*  CALCIUM 7.5* 6.9* 6.7* 8.4*  CREATININE  3.89* 3.74* 2.81* 6.87*  GFRNONAA 12* 13* 18* 6*    LIVER FUNCTION TESTS: Recent Labs    06/03/20 1613 06/17/20 1204  BILITOT 0.7 0.5  AST 32 13*  ALT 31 10  ALKPHOS 55 63  PROT 6.8 7.0  ALBUMIN 3.8 3.4*    Assessment and Plan: Patient familiar to IR service from left supraclavicular lymph node biopsy in 2015.  She is a 69 year old female with history of advanced breast cancer and acute renal failure/obstructive bilateral hydronephrosis.  She underwent bilateral ureteral stent placements and bladder biopsy on 06/03/2020.  Postop day #1 she developed severe left flank pain and underwent exchange of left ureteral stent on 06/07/2020.  CT scan at the time revealed calyceal rupture and stranding around the left kidney with  worsening hydronephrosis.  She now presents to [facility] long ED with worsening left flank pain, occasional nausea and CT findings of:  1. Bilateral ureteral stents remain in place, with the ends in the renal collecting systems and bladder. Considerable reduction in perinephric edema previously seen on the left. Moderate fullness of the left renal collecting system does persist. Mild fullness of the right renal collecting system persists. 2. Previous colostomy and distal colectomy. Soft tissue thickening in the presacral soft tissues appears unchanged. Scattered osseous metastatic lesions are not significantly changed. 3. Resolution of left effusion. Abnormal pulmonary interstitial pulmonary density persists. 4. Aortic atherosclerosis.  She is currently afebrile, WBC 11.3, hemoglobin 9.4, platelets normal, potassium 6, creatinine 6.87 up from 2.81.  COVID-19 negative.  Request now received from urology for left percutaneous nephrostomy.  Imaging studies have been reviewed by Dr. Kathlene Cote.Risks and benefits of left PCN placement was discussed with the patient including, but not limited to, infection, bleeding, significant bleeding causing loss or decrease in renal function or damage to adjacent structures.   All of the patient's questions were answered, patient is agreeable to proceed.  Consent signed and in chart.  Procedure scheduled for this evening. Dose of lokelma ordered in ED for hyperkalemia.     Electronically Signed: D. Rowe Robert, PA-C 06/17/2020, 5:03 PM   I spent a total of 30 minutes at the the patient's bedside AND on the patient's hospital floor or unit, greater than 50% of which was counseling/coordinating care for left percutaneous nephrostomy    Patient ID: [name], female   DOB: 11/20/1950, 69 y.o.   MRN: [id number]

## 2020-06-17 NOTE — Consult Note (Signed)
Urology Consult   Physician requesting consult: Joette Catching, MD  Reason for consult: left flank pain, worsening renal function  History of Present Illness: [name] is a 69 y.o. female with a history of metastatic breast cancer with concomitant bilateral hydronephrosis requiring indwelling ureteral stents.  She is s/p bilateral ureteral stent placement with Dr. Louis Meckel on 10/25 due to acute on chronic renal failure from her bilateral hydronephrosis.  She subsequently had to have her left ureteral stent exchanged and upsized on 10/29 due to left sided flank pain and worsening renal function.    She presents today with a 36 hour history of left sided flank pain associated with nausea and acute on chronic renal failure.  The patient denies vomiting, hematuria, dysuria or fever/chills since her last encounter.  Repeat CTSS today shows bilateral hydronephrosis with residual left perinephric stranding.  Both ureteral stents appear to be in adequate position on CT.   Past Medical History:  Diagnosis Date  . 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 Bladder infection   . Bone metastasis ([place]) 01/17/2016  . Borderline hypertension   . Breast cancer ([place])   . Closed fracture of humerus sept 2013  . Depression   . Family history of adverse reaction to anesthesia    father remembered his surgery  . Fatigue   . GERD (gastroesophageal reflux disease)   . Hypertension   . 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 Osteoporosis 01/27/2011  . Pneumonia   . Port catheter in place 04/14/2013  . Post-mastectomy lymphedema syndrome   . 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 Reflux   . Retinal detachment   . 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 Swollen lymph nodes   . URI (upper respiratory infection) 02/17/12  . Wears glasses     Past Surgical History:  Procedure Laterality Date   . ABDOMINAL PERINEAL BOWEL RESECTION N/A 09/01/2016   Procedure: ABDOMINAL PERINEAL RESECTION PERMANENT COLOSTOMY;  Surgeon: Leighton Ruff, MD;  Location: [facility];  Service: General;  Laterality: N/A;  . biopsy of lymph node     super clavicle  . BTL    . CATARACT EXTRACTION    . cervical disc fusioni    . CYSTOSCOPY W/ URETERAL STENT PLACEMENT Bilateral 06/03/2020   Procedure: CYSTOSCOPY WITH RETROGRADE PYELOGRAM/URETERAL STENT PLACEMENT bladder biopsy;  Surgeon: [name], MD;  Location: [facility];  Service: Urology;  Laterality: Bilateral;  . CYSTOSCOPY WITH STENT PLACEMENT Left 06/07/2020   Procedure: CYSTOSCOPY LEFT STENT EXCHANGE, retrograde pylogram;  Surgeon: [name], MD;  Location: [facility];  Service: Urology;  Laterality: Left;  . LYMPH NODE BIOPSY Right 06/19/2015   Procedure: RIGHT NECK LYMPH NODE EXCISION;  Surgeon: Rolm Bookbinder, MD;  Location: [facility];  Service: General;  Laterality: Right;  . LYMPH NODE DISSECTION Left 04/26/2017   Procedure: EXCISION LEFT NECK LYMPH NODE;  Surgeon: Rolm Bookbinder, MD;  Location: [facility];  Service: General;  Laterality: Left;  Marland Kitchen MASS EXCISION Left 07/27/2016   Procedure: EXCISION OF POSTERIOR NECK MASS;  Surgeon: Rolm Bookbinder, MD;  Location: [facility];  Service: General;  Laterality: Left;  Marland Kitchen MASTECTOMY MODIFIED RADICAL     left breast  . Port- a-cath insertion    . retinal detachment and repair      Current Hospital Medications:  Home Meds:  Current Meds  Medication Sig  . acetaminophen (TYLENOL) 500 MG tablet Take 1,000 mg by mouth every 6 (six) hours as needed for moderate pain or headache.  Marland Kitchen buPROPion (WELLBUTRIN SR) 100 MG 12 hr tablet Take 100 mg by mouth 2 (two) times daily.  Marland Kitchen CALCIUM-VITAMIN D PO Take 1 tablet by mouth 2 (two) times daily.   . clonazePAM (KLONOPIN) 0.5 MG tablet Take 0.5 mg by mouth 2 (two) times daily as needed for anxiety.  Marland Kitchen denosumab (XGEVA) 120  MG/1.7ML SOLN injection Inject 120 mg into the skin every 3 (three) months.  Marland Kitchen HYDROcodone-acetaminophen (NORCO/VICODIN) 5-325 MG tablet Take 1-2 tablets by mouth every 6 (six) hours as needed for moderate pain or severe pain.  Marland Kitchen letrozole (FEMARA) 2.5 MG tablet Take 2.5 mg by mouth daily.  . metoprolol succinate (TOPROL-XL) 25 MG 24 hr tablet Take 25 mg by mouth in the morning and at bedtime.   Marland Kitchen omeprazole (PRILOSEC) 40 MG capsule Take 1 capsule (40 mg total) by mouth 2 (two) times daily. (Patient taking differently: Take 40 mg by mouth in the morning and at bedtime. )  . potassium chloride (KLOR-CON) 10 MEQ tablet Take 20 mEq by mouth 2 (two) times daily.  . tamsulosin (FLOMAX) 0.4 MG CAPS capsule Take 1 capsule (0.4 mg total) by mouth 2 (two) times daily.  . vitamin B-12 (CYANOCOBALAMIN) 1000 MCG tablet Take 1,000 mcg by mouth 2 (two) times daily.     Scheduled Meds: . buPROPion  100 mg Oral BID  . fentaNYL      . letrozole  2.5 mg Oral Daily  . lidocaine      . metoprolol succinate  25 mg Oral Daily  . midazolam      . pantoprazole  40 mg Oral BID  . tamsulosin  0.4 mg Oral BID  . vitamin B-12  1,000 mcg Oral BID   Continuous Infusions: . sodium chloride 125 mL/hr at 06/17/20 1656  .  ceFAZolin (ANCEF) IV 2 g (06/17/20 1748)  . sodium bicarbonate (isotonic) 150 mEq in D5W 1000 mL infusion     PRN Meds:.acetaminophen, clonazePAM, fentaNYL (SUBLIMAZE) injection, ondansetron **OR** ondansetron (ZOFRAN) IV, oxyCODONE, polyethylene glycol  Allergies:  Allergies  Allergen Reactions  . Ciprofloxacin Other (See Comments)    tendonitis  . Prochlorperazine Anxiety    Other reaction(s): Confusion    Family History  Problem Relation Age of Onset  . Hypertension Mother   . Heart disease Mother        atrial fib  . Heart disease Father   . Other Father        glaucoma    Social History:  reports that she has never smoked. She has never used smokeless tobacco. She reports current  alcohol use of about 7.0 standard drinks of alcohol per week. She reports current drug use. Drug: Marijuana.  ROS: A complete review of systems was performed.  All systems are negative except for pertinent findings as noted.  Physical Exam:  Vital signs in last 24 hours: Temp:  [98.7 F (37.1 C)] 98.7 F (37.1 C) (11/08 0958) Pulse Rate:  [40-101] 97 (11/08 1730) Resp:  [11-28] 24 (11/08 1730) BP: (137-157)/(62-87) 151/66 (11/08 1730) SpO2:  [61 %-100 %] 100 % (11/08 1730) Weight:  [59.8 kg] 59.8 kg (11/08 1005) Constitutional:  Alert and oriented, No acute distress Cardiovascular: Regular rate and rhythm, No JVD Respiratory: Normal respiratory effort, Lungs clear bilaterally GI: Abdomen is soft, nontender, nondistended, no abdominal masses GU:  No CVA tenderness Lymphatic: No lymphadenopathy Neurologic: Grossly intact, no focal deficits Psychiatric: Normal mood and affect  Laboratory Data:  Recent Labs    06/17/20 1204  WBC 11.3*  HGB 9.4*  HCT 30.0*  PLT 327    Recent Labs    06/17/20 1204  NA 130*  K 6.0*  CL 101  GLUCOSE 88  BUN 46*  CALCIUM 8.4*  CREATININE 6.87*     Results for orders placed or performed during the hospital encounter of 06/17/20 (from the past 24 hour(s))  Comprehensive metabolic panel     Status: Abnormal   Collection Time: 06/17/20 12:04 PM  Result Value Ref Range   Sodium 130 (L) 135 - 145 mmol/L   Potassium 6.0 (H) 3.5 - 5.1 mmol/L   Chloride 101 98 - 111 mmol/L   CO2 20 (L) 22 - 32 mmol/L   Glucose, Bld 88 70 - 99 mg/dL   BUN 46 (H) 8 - 23 mg/dL   Creatinine, Ser 6.87 (H) 0.44 - 1.00 mg/dL   Calcium 8.4 (L) 8.9 - 10.3 mg/dL   Total Protein 7.0 6.5 - 8.1 g/dL   Albumin 3.4 (L) 3.5 - 5.0 g/dL   AST 13 (L) 15 - 41 U/L   ALT 10 0 - 44 U/L   Alkaline Phosphatase 63 38 - 126 U/L   Total Bilirubin 0.5 0.3 - 1.2 mg/dL   GFR, Estimated 6 (L) >60 mL/min   Anion gap 9 5 - 15  Lipase, blood     Status: None   Collection Time: 06/17/20  12:04 PM  Result Value Ref Range   Lipase 26 11 - 51 U/L  CBC with Diff     Status: Abnormal   Collection Time: 06/17/20 12:04 PM  Result Value Ref Range   WBC 11.3 (H) 4.0 - 10.5 K/uL   RBC 2.83 (L) 3.87 - 5.11 MIL/uL   Hemoglobin 9.4 (L) 12.0 - 15.0 g/dL   HCT 30.0 (L) 36 - 46 %   MCV 106.0 (H) 80.0 - 100.0 fL   MCH 33.2 26.0 - 34.0 pg   MCHC 31.3 30.0 - 36.0 g/dL   RDW 12.1 11.5 - 15.5 %   Platelets 327 150 - 400 K/uL   nRBC 0.0 0.0 - 0.2 %   Neutrophils Relative % 89 %   Neutro Abs 10.0 (H) 1.7 - 7.7 K/uL   Lymphocytes Relative 6 %   Lymphs Abs 0.7 0.7 - 4.0 K/uL   Monocytes Relative 5 %   Monocytes Absolute 0.6 0.1 - 1.0 K/uL   Eosinophils Relative 0 %   Eosinophils Absolute 0.1 0.0 - 0.5 K/uL   Basophils Relative 0 %   Basophils Absolute 0.0 0.0 - 0.1 K/uL   Immature Granulocytes 0 %   Abs Immature Granulocytes 0.05 0.00 - 0.07 K/uL  Respiratory Panel by RT PCR (Flu A&B, Covid) - Nasopharyngeal Swab     Status: None   Collection Time: 06/17/20  2:26 PM   Specimen: Nasopharyngeal Swab  Result Value Ref Range   SARS Coronavirus 2 by RT PCR NEGATIVE NEGATIVE   Influenza A by PCR NEGATIVE NEGATIVE   Influenza B by PCR NEGATIVE NEGATIVE  Protime-INR     Status: None   Collection Time: 06/17/20  4:38 PM  Result Value Ref Range   Prothrombin Time 13.9 11.4 - 15.2 seconds   INR 1.1 0.8 - 1.2   Recent Results (from the past 240 hour(s))  Respiratory Panel by RT PCR (Flu  A&B, Covid) - Nasopharyngeal Swab     Status: None   Collection Time: 06/17/20  2:26 PM   Specimen: Nasopharyngeal Swab  Result Value Ref Range Status   SARS Coronavirus 2 by RT PCR NEGATIVE NEGATIVE Final    Comment: (NOTE) SARS-CoV-2 target nucleic acids are NOT DETECTED.  The SARS-CoV-2 RNA is generally detectable in upper respiratoy specimens during the acute phase of infection. The lowest concentration of SARS-CoV-2 viral copies this assay can detect is 131 copies/mL. A negative result does not  preclude SARS-Cov-2 infection and should not be used as the sole basis for treatment or other patient management decisions. A negative result may occur with  improper specimen collection/handling, submission of specimen other than nasopharyngeal swab, presence of viral mutation(s) within the areas targeted by this assay, and inadequate number of viral copies (<131 copies/mL). A negative result must be combined with clinical observations, patient history, and epidemiological information. The expected result is Negative.  Fact Sheet for Patients:  PinkCheek.be  Fact Sheet for Healthcare Providers:  GravelBags.it  This test is no t yet approved or cleared by the [place] FDA and  has been authorized for detection and/or diagnosis of SARS-CoV-2 by FDA under an Emergency Use Authorization (EUA). This EUA will remain  in effect (meaning this test can be used) for the duration of the COVID-19 declaration under Section 564(b)(1) of the Act, 21 U.S.C. section 360bbb-3(b)(1), unless the authorization is terminated or revoked sooner.     Influenza A by PCR NEGATIVE NEGATIVE Final   Influenza B by PCR NEGATIVE NEGATIVE Final    Comment: (NOTE) The Xpert Xpress SARS-CoV-2/FLU/RSV assay is intended as an aid in  the diagnosis of influenza from Nasopharyngeal swab specimens and  should not be used as a sole basis for treatment. Nasal washings and  aspirates are unacceptable for Xpert Xpress SARS-CoV-2/FLU/RSV  testing.  Fact Sheet for Patients: PinkCheek.be  Fact Sheet for Healthcare Providers: GravelBags.it  This test is not yet approved or cleared by the [place] FDA and  has been authorized for detection and/or diagnosis of SARS-CoV-2 by  FDA under an Emergency Use Authorization (EUA). This EUA will remain  in effect (meaning this test can be used) for the  duration of the  Covid-19 declaration under Section 564(b)(1) of the Act, 21  U.S.C. section 360bbb-3(b)(1), unless the authorization is  terminated or revoked. Performed at [facility], [place] [address] [place], [place] [address]     Renal Function: Recent Labs    06/17/20 1204  CREATININE 6.87*   Estimated Creatinine Clearance: 7.2 mL/min (A) (by C-G formula based on SCr of 6.87 mg/dL (H)).  Radiologic Imaging: DG Abdomen 1 View  Result Date: 06/17/2020 CLINICAL DATA:  Abdominal pain EXAM: ABDOMEN - 1 VIEW COMPARISON:  CT abdomen and pelvis June 06, 2020. FINDINGS: Double-J stents are present bilaterally, stable. Ostomy left mid abdomen, unchanged. No abnormal calcifications demonstrable. No bowel dilatation or air-fluid level to suggest bowel obstruction. No free air on supine examination. IMPRESSION: Stable appearing double-J stents, unchanged in positions. Ostomy left mid abdomen. No abnormal calcification. No bowel obstruction or free air evident. Electronically Signed   By: Lowella Grip III M.D.   On: 06/17/2020 13:42   US Renal  Result Date: 06/17/2020 CLINICAL DATA:  Progressive flank region pain EXAM: RENAL / URINARY TRACT ULTRASOUND COMPLETE COMPARISON:  Renal ultrasound June 05, 2020; CT abdomen and pelvis June 06, 2020 FINDINGS: Right Kidney: Renal measurements: 11.0 x 6.0 x  7.0 cm = volume: 267 mL. Echogenicity and renal cortical thickness are within normal limits. No mass or perinephric fluid visualized. Scarring lateral right kidney noted. There is moderate hydronephrosis on the right. A stent is present extending from the right renal pelvis extending to the bladder. Left Kidney: Renal measurements: 11.5 x 6.5 x 5.5 cm = volume: 215 mL. Echogenicity and renal cortical thickness are within normal limits. No mass or perinephric fluid visualized. Moderate hydronephrosis. Double-J stent extending from left renal pelvis to the bladder level.  Bladder: Double-J stents in bladder.  Bladder virtually empty. Other: None. IMPRESSION: Double-J stents present bilaterally. Despite presence of stents, moderate hydronephrosis is present on each side. Scarring lateral right kidney. Study otherwise unremarkable. Electronically Signed   By: Lowella Grip III M.D.   On: 06/17/2020 13:47   CT Renal Stone Study  Result Date: 06/17/2020 CLINICAL DATA:  Follow-up hydronephrosis. Worsening left flank pain. EXAM: CT ABDOMEN AND PELVIS WITHOUT CONTRAST TECHNIQUE: Multidetector CT imaging of the abdomen and pelvis was performed following the standard protocol without IV contrast. COMPARISON:  06/06/2020 FINDINGS: Lower chest: Resolution of left effusion. Abnormal interstitial pulmonary density persists. Hepatobiliary: Normal without contrast. Pancreas: Normal Spleen: Normal Adrenals/Urinary Tract: Adrenal glands are normal. Bilateral ureteral stents remain in place, with the ends in the renal collecting system and bladder. Considerable reduction in perinephric edema previously seen on the left. Moderate fullness of the left renal collecting system does persist. Mild fullness of the right renal collecting system persists. Stomach/Bowel: Previous colostomy and distal colectomy. Soft tissue thickening in the presacral soft tissues appears unchanged. Vascular/Lymphatic: Aortic atherosclerosis. No aneurysm. IVC is normal. No retroperitoneal adenopathy is seen. Reproductive: Previous hysterectomy. Other: No free fluid or air. Musculoskeletal: Scattered osseous metastatic lesions are not significantly changed since the recent examinations. IMPRESSION: 1. Bilateral ureteral stents remain in place, with the ends in the renal collecting systems and bladder. Considerable reduction in perinephric edema previously seen on the left. Moderate fullness of the left renal collecting system does persist. Mild fullness of the right renal collecting system persists. 2. Previous colostomy  and distal colectomy. Soft tissue thickening in the presacral soft tissues appears unchanged. Scattered osseous metastatic lesions are not significantly changed. 3. Resolution of left effusion. Abnormal pulmonary interstitial pulmonary density persists. 4. Aortic atherosclerosis. Aortic Atherosclerosis (ICD10-I70.0). Electronically Signed   By: Nelson Chimes M.D.   On: 06/17/2020 15:09    I independently reviewed the above imaging studies.  Impression/Recommendation 69 year old female with bilateral hydronephrosis 2/2 metastatic breast cancer and acute on chronic renal failure despite bilateral ureteral stents.  -IR has been consulted to place left sided PCN as the patient has now failed two stent trials.  Continue IVF resuscitation.  If renal function does not improve with left PCN placement, will consider right PCN if necessary.  Will continue to monitor.   Ellison Hughs, MD [facility] 06/17/2020, 5:50 PM

## 2020-06-18 LAB — CBC
HCT: 26.4 % — ABNORMAL LOW (ref 36.0–46.0)
Hemoglobin: 8.3 g/dL — ABNORMAL LOW (ref 12.0–15.0)
MCH: 33.7 pg (ref 26.0–34.0)
MCHC: 31.4 g/dL (ref 30.0–36.0)
MCV: 107.3 fL — ABNORMAL HIGH (ref 80.0–100.0)
Platelets: 263 10*3/uL (ref 150–400)
RBC: 2.46 MIL/uL — ABNORMAL LOW (ref 3.87–5.11)
RDW: 12.3 % (ref 11.5–15.5)
WBC: 7.1 10*3/uL (ref 4.0–10.5)
nRBC: 0 % (ref 0.0–0.2)

## 2020-06-18 LAB — COMPREHENSIVE METABOLIC PANEL
ALT: 8 U/L (ref 0–44)
AST: 13 U/L — ABNORMAL LOW (ref 15–41)
Albumin: 2.8 g/dL — ABNORMAL LOW (ref 3.5–5.0)
Alkaline Phosphatase: 54 U/L (ref 38–126)
Anion gap: 8 (ref 5–15)
BUN: 41 mg/dL — ABNORMAL HIGH (ref 8–23)
CO2: 22 mmol/L (ref 22–32)
Calcium: 7.8 mg/dL — ABNORMAL LOW (ref 8.9–10.3)
Chloride: 103 mmol/L (ref 98–111)
Creatinine, Ser: 5.31 mg/dL — ABNORMAL HIGH (ref 0.44–1.00)
GFR, Estimated: 8 mL/min — ABNORMAL LOW (ref >60)
Glucose, Bld: 92 mg/dL (ref 70–99)
Potassium: 4.5 mmol/L (ref 3.5–5.1)
Sodium: 133 mmol/L — ABNORMAL LOW (ref 135–145)
Total Bilirubin: 0.5 mg/dL (ref 0.3–1.2)
Total Protein: 5.8 g/dL — ABNORMAL LOW (ref 6.5–8.1)

## 2020-06-18 MED ORDER — SODIUM CHLORIDE 0.9% FLUSH
10.0000 mL | Freq: Two times a day (BID) | INTRAVENOUS | Status: DC
  Administered 2020-06-18 – 2020-06-21 (×2): 10 mL

## 2020-06-18 MED ORDER — SODIUM CHLORIDE 0.9% FLUSH
10.0000 mL | INTRAVENOUS | Status: DC | PRN
  Administered 2020-06-22: 10 mL

## 2020-06-18 MED ORDER — CHLORHEXIDINE GLUCONATE CLOTH 2 % EX PADS
6.0000 | MEDICATED_PAD | Freq: Every day | CUTANEOUS | Status: DC
  Administered 2020-06-18 – 2020-06-21 (×4): 6 via TOPICAL

## 2020-06-18 MED ORDER — HEPARIN SODIUM (PORCINE) 5000 UNIT/ML IJ SOLN
5000.0000 [IU] | Freq: Three times a day (TID) | INTRAMUSCULAR | Status: DC
  Administered 2020-06-18 – 2020-06-22 (×11): 5000 [IU] via SUBCUTANEOUS
  Filled 2020-06-18 (×11): qty 1

## 2020-06-18 MED ORDER — SODIUM CHLORIDE 0.9 % IV SOLN
INTRAVENOUS | Status: DC
  Administered 2020-06-21: 1000 mL via INTRAVENOUS

## 2020-06-18 NOTE — Plan of Care (Signed)

## 2020-06-18 NOTE — Progress Notes (Signed)
Referring Physician(s): [place]  Supervising Physician: Arne Cleveland  Patient Status:  [facility] - In-pt  Chief Complaint:  Left flank pain  Subjective: Patient feeling better since left nephrostomy placed yesterday; left flank pain has improved, does have some intermittent nausea   Allergies: Ciprofloxacin and Prochlorperazine  Medications: Prior to Admission medications   Medication Sig Start Date End Date Taking? Authorizing Provider  acetaminophen (TYLENOL) 500 MG tablet Take 1,000 mg by mouth every 6 (six) hours as needed for moderate pain or headache.   Yes [provider]  buPROPion (WELLBUTRIN SR) 100 MG 12 hr tablet Take 100 mg by mouth 2 (two) times daily.   Yes [provider]  CALCIUM-VITAMIN D PO Take 1 tablet by mouth 2 (two) times daily.    Yes [provider]  clonazePAM (KLONOPIN) 0.5 MG tablet Take 0.5 mg by mouth 2 (two) times daily as needed for anxiety.   Yes [provider]  denosumab (XGEVA) 120 MG/1.7ML SOLN injection Inject 120 mg into the skin every 3 (three) months.   Yes [provider]  HYDROcodone-acetaminophen (NORCO/VICODIN) 5-325 MG tablet Take 1-2 tablets by mouth every 6 (six) hours as needed for moderate pain or severe pain.   Yes [provider]  letrozole (FEMARA) 2.5 MG tablet Take 2.5 mg by mouth daily.   Yes [provider]  metoprolol succinate (TOPROL-XL) 25 MG 24 hr tablet Take 25 mg by mouth in the morning and at bedtime.  04/23/16  Yes [provider]  omeprazole (PRILOSEC) 40 MG capsule Take 1 capsule (40 mg total) by mouth 2 (two) times daily. Patient taking differently: Take 40 mg by mouth in the morning and at bedtime.  03/19/16  Yes Holley Bouche, NP  potassium chloride (KLOR-CON) 10 MEQ tablet Take 20 mEq by mouth 2 (two) times daily.   Yes [provider]  tamsulosin (FLOMAX) 0.4 MG CAPS capsule Take 1 capsule (0.4 mg total) by mouth 2 (two)  times daily. 06/08/20 07/08/20 Yes Gherghe, Vella Redhead, MD  vitamin B-12 (CYANOCOBALAMIN) 1000 MCG tablet Take 1,000 mcg by mouth 2 (two) times daily.    Yes [provider]     Vital Signs: BP (!) 143/66 (BP Location: Right Arm)   Pulse 73   Temp 98.8 F (37.1 C) (Oral)   Resp 17   Ht 5\' 5"  (1.651 m)   Wt 123 lb 4.8 oz (55.9 kg)   SpO2 100%   BMI 20.52 kg/m   Physical Exam awake, alert.  Left nephrostomy tube intact, insertion site okay, mildly tender, output 2 L of light yellow urine  Imaging: DG Abdomen 1 View  Result Date: 06/17/2020 CLINICAL DATA:  Abdominal pain EXAM: ABDOMEN - 1 VIEW COMPARISON:  CT abdomen and pelvis June 06, 2020. FINDINGS: Double-J stents are present bilaterally, stable. Ostomy left mid abdomen, unchanged. No abnormal calcifications demonstrable. No bowel dilatation or air-fluid level to suggest bowel obstruction. No free air on supine examination. IMPRESSION: Stable appearing double-J stents, unchanged in positions. Ostomy left mid abdomen. No abnormal calcification. No bowel obstruction or free air evident. Electronically Signed   By: Lowella Grip III M.D.   On: 06/17/2020 13:42   US Renal  Result Date: 06/17/2020 CLINICAL DATA:  Progressive flank region pain EXAM: RENAL / URINARY TRACT ULTRASOUND COMPLETE COMPARISON:  Renal ultrasound June 05, 2020; CT abdomen and pelvis June 06, 2020 FINDINGS: Right Kidney: Renal measurements: 11.0 x 6.0 x 7.0 cm = volume: 267 mL. Echogenicity  and renal cortical thickness are within normal limits. No mass or perinephric fluid visualized. Scarring lateral right kidney noted. There is moderate hydronephrosis on the right. A stent is present extending from the right renal pelvis extending to the bladder. Left Kidney: Renal measurements: 11.5 x 6.5 x 5.5 cm = volume: 215 mL. Echogenicity and renal cortical thickness are within normal limits. No mass or perinephric fluid visualized. Moderate hydronephrosis.  Double-J stent extending from left renal pelvis to the bladder level. Bladder: Double-J stents in bladder.  Bladder virtually empty. Other: None. IMPRESSION: Double-J stents present bilaterally. Despite presence of stents, moderate hydronephrosis is present on each side. Scarring lateral right kidney. Study otherwise unremarkable. Electronically Signed   By: Lowella Grip III M.D.   On: 06/17/2020 13:47   CT Renal Stone Study  Result Date: 06/17/2020 CLINICAL DATA:  Follow-up hydronephrosis. Worsening left flank pain. EXAM: CT ABDOMEN AND PELVIS WITHOUT CONTRAST TECHNIQUE: Multidetector CT imaging of the abdomen and pelvis was performed following the standard protocol without IV contrast. COMPARISON:  06/06/2020 FINDINGS: Lower chest: Resolution of left effusion. Abnormal interstitial pulmonary density persists. Hepatobiliary: Normal without contrast. Pancreas: Normal Spleen: Normal Adrenals/Urinary Tract: Adrenal glands are normal. Bilateral ureteral stents remain in place, with the ends in the renal collecting system and bladder. Considerable reduction in perinephric edema previously seen on the left. Moderate fullness of the left renal collecting system does persist. Mild fullness of the right renal collecting system persists. Stomach/Bowel: Previous colostomy and distal colectomy. Soft tissue thickening in the presacral soft tissues appears unchanged. Vascular/Lymphatic: Aortic atherosclerosis. No aneurysm. IVC is normal. No retroperitoneal adenopathy is seen. Reproductive: Previous hysterectomy. Other: No free fluid or air. Musculoskeletal: Scattered osseous metastatic lesions are not significantly changed since the recent examinations. IMPRESSION: 1. Bilateral ureteral stents remain in place, with the ends in the renal collecting systems and bladder. Considerable reduction in perinephric edema previously seen on the left. Moderate fullness of the left renal collecting system does persist. Mild fullness  of the right renal collecting system persists. 2. Previous colostomy and distal colectomy. Soft tissue thickening in the presacral soft tissues appears unchanged. Scattered osseous metastatic lesions are not significantly changed. 3. Resolution of left effusion. Abnormal pulmonary interstitial pulmonary density persists. 4. Aortic atherosclerosis. Aortic Atherosclerosis (ICD10-I70.0). Electronically Signed   By: Nelson Chimes M.D.   On: 06/17/2020 15:09   IR NEPHROSTOMY PLACEMENT LEFT  Result Date: 06/18/2020 CLINICAL DATA:  History of metastatic breast carcinoma, bilateral hydronephrosis, bilateral ureteral stent placement and worsening renal function with acute left flank pain. Due to worsening hydronephrosis and acute left flank pain, decision has been made to place a left percutaneous nephrostomy tube at this time. EXAM: 1. ULTRASOUND GUIDANCE FOR PUNCTURE OF THE LEFT RENAL COLLECTING SYSTEM. 2. LEFT PERCUTANEOUS NEPHROSTOMY TUBE PLACEMENT. COMPARISON:  CT of the abdomen and pelvis as well as renal ultrasound earlier today. ANESTHESIA/SEDATION: 3.0 mg IV Versed; 100 mcg IV Fentanyl. Total Moderate Sedation Time 18 minutes. Moderate (conscious) sedation was employed during this procedure. A total of Versed 3.0 mg and Fentanyl 100 mcg was administered intravenously. The patient's level of consciousness and vital signs were monitored continuously by radiology nursing throughout the procedure under my direct supervision. CONTRAST:  12 ml Omnipaque 300 MEDICATIONS: 2 g IV Ancef. Antibiotic was administered in an appropriate time frame prior to skin puncture. FLUOROSCOPY TIME:  1 minutes and 18 seconds.  8.0 mGy. PROCEDURE: The procedure, risks, benefits, and alternatives were explained to the patient. Questions regarding the procedure  were encouraged and answered. The patient understands and consents to the procedure. A time-out was performed prior to initiating the procedure. Ultrasound was utilized to localize  the left kidney. The left flank region was prepped with chlorhexidine in a sterile fashion, and a sterile drape was applied covering the operative field. A sterile gown and sterile gloves were used for the procedure. Local anesthesia was provided with 1% Lidocaine. Under direct ultrasound guidance, a 21 gauge needle was advanced into the renal collecting system. Ultrasound image documentation was performed. Aspiration of urine sample was performed followed by contrast injection. A transitional dilator was advanced over a guidewire. Percutaneous tract dilatation was then performed over the guidewire. A 10-French percutaneous nephrostomy tube was then advanced and formed in the collecting system. Catheter position was confirmed by fluoroscopy after contrast injection. The catheter was secured at the skin with a Prolene retention suture and Stat-Lock device. A gravity bag was placed. COMPLICATIONS: None. FINDINGS: Ultrasound demonstrates moderate hydronephrosis. Via interpolar caliceal access, a 10 French nephrostomy tube was advanced and formed in the renal pelvis. There is good return of urine from the nephrostomy tube after placement. IMPRESSION: Placement of 10 French left percutaneous nephrostomy tube via interpolar caliceal access. The tube was formed in the renal pelvis and connected to gravity bag drainage. Electronically Signed   By: Aletta Edouard M.D.   On: 06/18/2020 08:37    Labs:  CBC: Recent Labs    06/07/20 0600 06/08/20 0520 06/17/20 1204 06/18/20 0621  WBC 12.7* 10.6* 11.3* 7.1  HGB 9.1* 8.5* 9.4* 8.3*  HCT 27.1* 25.6* 30.0* 26.4*  PLT 228 215 327 263    COAGS: Recent Labs    06/17/20 1638  INR 1.1    BMP: Recent Labs    06/07/20 0600 06/08/20 0520 06/17/20 1204 06/18/20 0621  NA 136 140 130* 133*  K 3.2* 3.6 6.0* 4.5  CL 106 111 101 103  CO2 19* 20* 20* 22  GLUCOSE 96 105* 88 92  BUN 37* 37* 46* 41*  CALCIUM 6.9* 6.7* 8.4* 7.8*  CREATININE 3.74* 2.81* 6.87*  5.31*  GFRNONAA 13* 18* 6* 8*    LIVER FUNCTION TESTS: Recent Labs    06/03/20 1613 06/17/20 1204 06/18/20 0621  BILITOT 0.7 0.5 0.5  AST 32 13* 13*  ALT 31 10 8   ALKPHOS 55 63 54  PROT 6.8 7.0 5.8*  ALBUMIN 3.8 3.4* 2.8*    Assessment and Plan: Patient with history of metastatic breast carcinoma, bilateral hydronephrosis, bilateral ureteral stent placements, worsening renal function, acute left flank pain; status post left percutaneous nephrostomy on 11/8; afebrile, WBC normal, hemoglobin 8.3 down slightly from 9.4, creatinine 5.3 down from 6.87; continue current treatment, lab checks; further plans as per urology   Electronically Signed: D. Rowe Robert, PA-C 06/18/2020, 3:17 PM   I spent a total of 15 minutes at the the patient's bedside AND on the patient's hospital floor or unit, greater than 50% of which was counseling/coordinating care for left percutaneous nephrostomy    Patient ID: [name], female   DOB: [birth year], 69 y.o.   MRN: [id number]

## 2020-06-18 NOTE — Progress Notes (Signed)
[name]  CXK:[id number] DOB: August 26, 1950 DOA: 06/17/2020 PCP: Celene Squibb, MD    Brief Narrative:  69 year old with a history of metastatic breast cancer currently on a chemo holiday, BRCA2 mutation, anxiety/depression, remote anal cancer, CKD stage IIIa, HTN, and recent admission for obstructive uropathy and acute kidney injury requiring bilateral ureteral stent placement who was discharged home 10/30.  Patient reported she was doing well until 4 days prior to this admission when she began to experience the recurrence of flank pain.  This pain only worsened despite use of narcotic pain medications and became associated with dry heaving and severe nausea.  She also appreciated decreased urinary output.  She spoke to her urologist office over the weekend and was told to present to the ED if she began to experience fevers.  Today when her symptoms persisted/worsened she decided to come to the ED for evaluation.  In the ED she was found to be suffering with severe acute renal failure, hyperkalemia, and renal ultrasound noted moderate bilateral hydronephrosis despite presence of ureteral stents.  Significant Events:  10/26 Bilateral stent placement 10/26 10/29 Left Stent exchange 10/29 10/30 d/c home  11/8 admit via ED 11/8 left percutaneous nephrostomy placed in IR  Antimicrobials:  Zosyn 11/8 >  DVT prophylaxis: Subcutaneous heparin  Subjective: Vital signs are stable.  The patient is afebrile.  Creatinine is trending downward.  WBC has normalized.  Patient states she is feeling better overall.  Her appetite is returning and she would like to advance her diet.  She denies shortness of breath chest pain or significant abdominal pain.  Assessment & Plan:  Recurrent obstructive uropathy Status post left percutaneous nephrostomy tube 11/8 in IR -appears to be draining well at present -nephrostomy care per IR and decision on whether right nephrostomy indicated per urology  Acute kidney  failure on CKD stage IIIa Creatinine 2.81 at time of discharge 10/30 -creatinine is presently trending downward -continue to follow in a serial fashion  Recent Labs  Lab 06/17/20 1204 06/18/20 0621  CREATININE 6.87* 5.31*    Severe hyperkalemia Lokelma dosed while in ED - much improved -discontinue bicarbonate IV fluid  Hyponatremia trending upward with volume resuscitation -slow IV fluid and monitor trend  Metastatic breast cancer to bone Continue usual home therapies  History of anal cancer status post resection and colostomy No acute complications   HTN BP reasonably controlled  Chronic macrocytic anemia No evidence of signif blood loss - follow trend   Anxiety/depression  Code Status: FULL CODE Family Communication:  Status is: Inpatient  Remains inpatient appropriate because:Inpatient level of care appropriate due to severity of illness   Dispo: The patient is from: Home              Anticipated d/c is to: Home              Anticipated d/c date is: 3 days              Patient currently is not medically stable to d/c.   Consultants:  Urology IR  Objective: Blood pressure 134/73, pulse 85, temperature 98.4 F (36.9 C), resp. rate 18, height '5\' 5"'  (1.651 m), weight 55.9 kg, SpO2 100 %.  Intake/Output Summary (Last 24 hours) at 06/18/2020 1045 Last data filed at 06/18/2020 0853 Gross per 24 hour  Intake 2731.86 ml  Output 2250 ml  Net 481.86 ml   Filed Weights   06/17/20 1005 06/17/20 1834  Weight: 59.8 kg 55.9 kg  Examination: General: No acute respiratory distress Lungs: Clear to auscultation bilaterally without wheezes or crackles Cardiovascular: Regular rate and rhythm without murmur Abdomen: Nontender, nondistended, soft, bowel sounds positive, no rebound, no ascites, no appreciable mass -left nephrostomy with clear yellow fluid in collection bag Extremities: No significant cyanosis, clubbing, or edema bilateral lower  extremities  CBC: Recent Labs  Lab 06/17/20 1204 06/18/20 0621  WBC 11.3* 7.1  NEUTROABS 10.0*  --   HGB 9.4* 8.3*  HCT 30.0* 26.4*  MCV 106.0* 107.3*  PLT 327 244   Basic Metabolic Panel: Recent Labs  Lab 06/17/20 1204 06/18/20 0621  NA 130* 133*  K 6.0* 4.5  CL 101 103  CO2 20* 22  GLUCOSE 88 92  BUN 46* 41*  CREATININE 6.87* 5.31*  CALCIUM 8.4* 7.8*   GFR: Estimated Creatinine Clearance: 9.1 mL/min (A) (by C-G formula based on SCr of 5.31 mg/dL (H)).  Liver Function Tests: Recent Labs  Lab 06/17/20 1204 06/18/20 0621  AST 13* 13*  ALT 10 8  ALKPHOS 63 54  BILITOT 0.5 0.5  PROT 7.0 5.8*  ALBUMIN 3.4* 2.8*   Recent Labs  Lab 06/17/20 1204  LIPASE 26    Coagulation Profile: Recent Labs  Lab 06/17/20 1638  INR 1.1   HbA1C: Hgb A1c MFr Bld  Date/Time Value Ref Range Status  08/28/2016 02:27 PM 5.3 4.8 - 5.6 % Final    Comment:    (NOTE)         Pre-diabetes: 5.7 - 6.4         Diabetes: >6.4         Glycemic control for adults with diabetes: <7.0     Recent Results (from the past 240 hour(s))  Respiratory Panel by RT PCR (Flu A&B, Covid) - Nasopharyngeal Swab     Status: None   Collection Time: 06/17/20  2:26 PM   Specimen: Nasopharyngeal Swab  Result Value Ref Range Status   SARS Coronavirus 2 by RT PCR NEGATIVE NEGATIVE Final    Comment: (NOTE) SARS-CoV-2 target nucleic acids are NOT DETECTED.  The SARS-CoV-2 RNA is generally detectable in upper respiratoy specimens during the acute phase of infection. The lowest concentration of SARS-CoV-2 viral copies this assay can detect is 131 copies/mL. A negative result does not preclude SARS-Cov-2 infection and should not be used as the sole basis for treatment or other patient management decisions. A negative result may occur with  improper specimen collection/handling, submission of specimen other than nasopharyngeal swab, presence of viral mutation(s) within the areas targeted by this  assay, and inadequate number of viral copies (<131 copies/mL). A negative result must be combined with clinical observations, patient history, and epidemiological information. The expected result is Negative.  Fact Sheet for Patients:  PinkCheek.be  Fact Sheet for Healthcare Providers:  GravelBags.it  This test is no t yet approved or cleared by the [place] FDA and  has been authorized for detection and/or diagnosis of SARS-CoV-2 by FDA under an Emergency Use Authorization (EUA). This EUA will remain  in effect (meaning this test can be used) for the duration of the COVID-19 declaration under Section 564(b)(1) of the Act, 21 U.S.C. section 360bbb-3(b)(1), unless the authorization is terminated or revoked sooner.     Influenza A by PCR NEGATIVE NEGATIVE Final   Influenza B by PCR NEGATIVE NEGATIVE Final    Comment: (NOTE) The Xpert Xpress SARS-CoV-2/FLU/RSV assay is intended as an aid in  the diagnosis of influenza from Nasopharyngeal swab specimens and  should not be used as a sole basis for treatment. Nasal washings and  aspirates are unacceptable for Xpert Xpress SARS-CoV-2/FLU/RSV  testing.  Fact Sheet for Patients: PinkCheek.be  Fact Sheet for Healthcare Providers: GravelBags.it  This test is not yet approved or cleared by the [place] FDA and  has been authorized for detection and/or diagnosis of SARS-CoV-2 by  FDA under an Emergency Use Authorization (EUA). This EUA will remain  in effect (meaning this test can be used) for the duration of the  Covid-19 declaration under Section 564(b)(1) of the Act, 21  U.S.C. section 360bbb-3(b)(1), unless the authorization is  terminated or revoked. Performed at [facility], [place] [address], [place], [place] [address]      Scheduled Meds: . buPROPion  100 mg Oral BID  . Chlorhexidine  Gluconate Cloth  6 each Topical Daily  . letrozole  2.5 mg Oral Daily  . metoprolol succinate  25 mg Oral Daily  . pantoprazole  40 mg Oral BID  . sodium chloride flush  10-40 mL Intracatheter Q12H  . sodium chloride flush  5 mL Intracatheter Q8H  . tamsulosin  0.4 mg Oral BID  . vitamin B-12  1,000 mcg Oral BID   Continuous Infusions: . sodium chloride 125 mL/hr at 06/18/20 0624  . sodium bicarbonate (isotonic) 150 mEq in D5W 1000 mL infusion 75 mL/hr at 06/17/20 2226     LOS: 1 day   Cherene Altes, MD [facility] Office  [phone] Pager - Text Page per Amion  If 7PM-7AM, please contact night-coverage per Amion 06/18/2020, 10:45 AM

## 2020-06-19 ENCOUNTER — Inpatient Hospital Stay (HOSPITAL_COMMUNITY): Payer: Medicare Other

## 2020-06-19 DIAGNOSIS — N179 Acute kidney failure, unspecified: Principal | ICD-10-CM

## 2020-06-19 LAB — COMPREHENSIVE METABOLIC PANEL
ALT: 7 U/L (ref 0–44)
AST: 14 U/L — ABNORMAL LOW (ref 15–41)
Albumin: 2.7 g/dL — ABNORMAL LOW (ref 3.5–5.0)
Alkaline Phosphatase: 55 U/L (ref 38–126)
Anion gap: 7 (ref 5–15)
BUN: 25 mg/dL — ABNORMAL HIGH (ref 8–23)
CO2: 25 mmol/L (ref 22–32)
Calcium: 7.3 mg/dL — ABNORMAL LOW (ref 8.9–10.3)
Chloride: 103 mmol/L (ref 98–111)
Creatinine, Ser: 3.4 mg/dL — ABNORMAL HIGH (ref 0.44–1.00)
GFR, Estimated: 14 mL/min — ABNORMAL LOW (ref >60)
Glucose, Bld: 94 mg/dL (ref 70–99)
Potassium: 3.7 mmol/L (ref 3.5–5.1)
Sodium: 135 mmol/L (ref 135–145)
Total Bilirubin: 0.2 mg/dL — ABNORMAL LOW (ref 0.3–1.2)
Total Protein: 5.9 g/dL — ABNORMAL LOW (ref 6.5–8.1)

## 2020-06-19 LAB — CBC
HCT: 24.1 % — ABNORMAL LOW (ref 36.0–46.0)
Hemoglobin: 7.9 g/dL — ABNORMAL LOW (ref 12.0–15.0)
MCH: 34.1 pg — ABNORMAL HIGH (ref 26.0–34.0)
MCHC: 32.8 g/dL (ref 30.0–36.0)
MCV: 103.9 fL — ABNORMAL HIGH (ref 80.0–100.0)
Platelets: 281 10*3/uL (ref 150–400)
RBC: 2.32 MIL/uL — ABNORMAL LOW (ref 3.87–5.11)
RDW: 12.2 % (ref 11.5–15.5)
WBC: 6.1 10*3/uL (ref 4.0–10.5)
nRBC: 0 % (ref 0.0–0.2)

## 2020-06-19 MED ORDER — ADULT MULTIVITAMIN W/MINERALS CH
1.0000 | ORAL_TABLET | Freq: Every day | ORAL | Status: DC
  Administered 2020-06-19 – 2020-06-22 (×4): 1 via ORAL
  Filled 2020-06-19 (×4): qty 1

## 2020-06-19 MED ORDER — ENSURE ENLIVE PO LIQD
237.0000 mL | ORAL | Status: DC
  Administered 2020-06-19 – 2020-06-20 (×2): 237 mL via ORAL

## 2020-06-19 MED ORDER — BOOST PLUS PO LIQD
237.0000 mL | ORAL | Status: DC
  Administered 2020-06-20 – 2020-06-22 (×3): 237 mL via ORAL
  Filled 2020-06-19 (×3): qty 237

## 2020-06-19 MED ORDER — BOOST / RESOURCE BREEZE PO LIQD CUSTOM
1.0000 | ORAL | Status: DC
  Administered 2020-06-20 – 2020-06-22 (×4): 1 via ORAL

## 2020-06-19 NOTE — Progress Notes (Signed)
Referring Physician(s): [place]  Supervising Physician: Sandi Mariscal  Patient Status:  [facility] - In-pt  Chief Complaint:  Left flank pain  Subjective: Patient without acute changes; still has some mild left flank discomfort; no nausea/ vomiting   Allergies: Ciprofloxacin and Prochlorperazine  Medications: Prior to Admission medications   Medication Sig Start Date End Date Taking? Authorizing Provider  acetaminophen (TYLENOL) 500 MG tablet Take 1,000 mg by mouth every 6 (six) hours as needed for moderate pain or headache.   Yes [provider]  buPROPion (WELLBUTRIN SR) 100 MG 12 hr tablet Take 100 mg by mouth 2 (two) times daily.   Yes [provider]  CALCIUM-VITAMIN D PO Take 1 tablet by mouth 2 (two) times daily.    Yes [provider]  clonazePAM (KLONOPIN) 0.5 MG tablet Take 0.5 mg by mouth 2 (two) times daily as needed for anxiety.   Yes [provider]  denosumab (XGEVA) 120 MG/1.7ML SOLN injection Inject 120 mg into the skin every 3 (three) months.   Yes [provider]  HYDROcodone-acetaminophen (NORCO/VICODIN) 5-325 MG tablet Take 1-2 tablets by mouth every 6 (six) hours as needed for moderate pain or severe pain.   Yes [provider]  letrozole (FEMARA) 2.5 MG tablet Take 2.5 mg by mouth daily.   Yes [provider]  metoprolol succinate (TOPROL-XL) 25 MG 24 hr tablet Take 25 mg by mouth in the morning and at bedtime.  04/23/16  Yes [provider]  omeprazole (PRILOSEC) 40 MG capsule Take 1 capsule (40 mg total) by mouth 2 (two) times daily. Patient taking differently: Take 40 mg by mouth in the morning and at bedtime.  03/19/16  Yes Holley Bouche, NP  potassium chloride (KLOR-CON) 10 MEQ tablet Take 20 mEq by mouth 2 (two) times daily.   Yes [provider]  tamsulosin (FLOMAX) 0.4 MG CAPS capsule Take 1 capsule (0.4 mg total) by mouth 2 (two) times daily. 06/08/20 07/08/20 Yes Gherghe,  Vella Redhead, MD  vitamin B-12 (CYANOCOBALAMIN) 1000 MCG tablet Take 1,000 mcg by mouth 2 (two) times daily.    Yes [provider]     Vital Signs: BP (!) 162/79 (BP Location: Right Arm)   Pulse 75   Temp 99.1 F (37.3 C) (Oral)   Resp 19   Ht 5\' 5"  (1.651 m)   Wt 123 lb 4.8 oz (55.9 kg)   SpO2 100%   BMI 20.52 kg/m   Physical Exam awake, alert.  Left nephrostomy intact, insertion site mildly tender, dressing clean and dry, output 375 cc of slightly blood-tinged urine  Imaging: DG Abdomen 1 View  Result Date: 06/17/2020 CLINICAL DATA:  Abdominal pain EXAM: ABDOMEN - 1 VIEW COMPARISON:  CT abdomen and pelvis June 06, 2020. FINDINGS: Double-J stents are present bilaterally, stable. Ostomy left mid abdomen, unchanged. No abnormal calcifications demonstrable. No bowel dilatation or air-fluid level to suggest bowel obstruction. No free air on supine examination. IMPRESSION: Stable appearing double-J stents, unchanged in positions. Ostomy left mid abdomen. No abnormal calcification. No bowel obstruction or free air evident. Electronically Signed   By: Lowella Grip III M.D.   On: 06/17/2020 13:42   US Renal  Result Date: 06/17/2020 CLINICAL DATA:  Progressive flank region pain EXAM: RENAL / URINARY TRACT ULTRASOUND COMPLETE COMPARISON:  Renal ultrasound June 05, 2020; CT abdomen and pelvis June 06, 2020 FINDINGS: Right Kidney: Renal measurements: 11.0 x 6.0 x 7.0 cm = volume: 267 mL. Echogenicity and renal  cortical thickness are within normal limits. No mass or perinephric fluid visualized. Scarring lateral right kidney noted. There is moderate hydronephrosis on the right. A stent is present extending from the right renal pelvis extending to the bladder. Left Kidney: Renal measurements: 11.5 x 6.5 x 5.5 cm = volume: 215 mL. Echogenicity and renal cortical thickness are within normal limits. No mass or perinephric fluid visualized. Moderate hydronephrosis. Double-J stent  extending from left renal pelvis to the bladder level. Bladder: Double-J stents in bladder.  Bladder virtually empty. Other: None. IMPRESSION: Double-J stents present bilaterally. Despite presence of stents, moderate hydronephrosis is present on each side. Scarring lateral right kidney. Study otherwise unremarkable. Electronically Signed   By: Lowella Grip III M.D.   On: 06/17/2020 13:47   CT Renal Stone Study  Result Date: 06/17/2020 CLINICAL DATA:  Follow-up hydronephrosis. Worsening left flank pain. EXAM: CT ABDOMEN AND PELVIS WITHOUT CONTRAST TECHNIQUE: Multidetector CT imaging of the abdomen and pelvis was performed following the standard protocol without IV contrast. COMPARISON:  06/06/2020 FINDINGS: Lower chest: Resolution of left effusion. Abnormal interstitial pulmonary density persists. Hepatobiliary: Normal without contrast. Pancreas: Normal Spleen: Normal Adrenals/Urinary Tract: Adrenal glands are normal. Bilateral ureteral stents remain in place, with the ends in the renal collecting system and bladder. Considerable reduction in perinephric edema previously seen on the left. Moderate fullness of the left renal collecting system does persist. Mild fullness of the right renal collecting system persists. Stomach/Bowel: Previous colostomy and distal colectomy. Soft tissue thickening in the presacral soft tissues appears unchanged. Vascular/Lymphatic: Aortic atherosclerosis. No aneurysm. IVC is normal. No retroperitoneal adenopathy is seen. Reproductive: Previous hysterectomy. Other: No free fluid or air. Musculoskeletal: Scattered osseous metastatic lesions are not significantly changed since the recent examinations. IMPRESSION: 1. Bilateral ureteral stents remain in place, with the ends in the renal collecting systems and bladder. Considerable reduction in perinephric edema previously seen on the left. Moderate fullness of the left renal collecting system does persist. Mild fullness of the right  renal collecting system persists. 2. Previous colostomy and distal colectomy. Soft tissue thickening in the presacral soft tissues appears unchanged. Scattered osseous metastatic lesions are not significantly changed. 3. Resolution of left effusion. Abnormal pulmonary interstitial pulmonary density persists. 4. Aortic atherosclerosis. Aortic Atherosclerosis (ICD10-I70.0). Electronically Signed   By: Nelson Chimes M.D.   On: 06/17/2020 15:09   IR NEPHROSTOMY PLACEMENT LEFT  Result Date: 06/18/2020 CLINICAL DATA:  History of metastatic breast carcinoma, bilateral hydronephrosis, bilateral ureteral stent placement and worsening renal function with acute left flank pain. Due to worsening hydronephrosis and acute left flank pain, decision has been made to place a left percutaneous nephrostomy tube at this time. EXAM: 1. ULTRASOUND GUIDANCE FOR PUNCTURE OF THE LEFT RENAL COLLECTING SYSTEM. 2. LEFT PERCUTANEOUS NEPHROSTOMY TUBE PLACEMENT. COMPARISON:  CT of the abdomen and pelvis as well as renal ultrasound earlier today. ANESTHESIA/SEDATION: 3.0 mg IV Versed; 100 mcg IV Fentanyl. Total Moderate Sedation Time 18 minutes. Moderate (conscious) sedation was employed during this procedure. A total of Versed 3.0 mg and Fentanyl 100 mcg was administered intravenously. The patient's level of consciousness and vital signs were monitored continuously by radiology nursing throughout the procedure under my direct supervision. CONTRAST:  12 ml Omnipaque 300 MEDICATIONS: 2 g IV Ancef. Antibiotic was administered in an appropriate time frame prior to skin puncture. FLUOROSCOPY TIME:  1 minutes and 18 seconds.  8.0 mGy. PROCEDURE: The procedure, risks, benefits, and alternatives were explained to the patient. Questions regarding the procedure were encouraged  and answered. The patient understands and consents to the procedure. A time-out was performed prior to initiating the procedure. Ultrasound was utilized to localize the left  kidney. The left flank region was prepped with chlorhexidine in a sterile fashion, and a sterile drape was applied covering the operative field. A sterile gown and sterile gloves were used for the procedure. Local anesthesia was provided with 1% Lidocaine. Under direct ultrasound guidance, a 21 gauge needle was advanced into the renal collecting system. Ultrasound image documentation was performed. Aspiration of urine sample was performed followed by contrast injection. A transitional dilator was advanced over a guidewire. Percutaneous tract dilatation was then performed over the guidewire. A 10-French percutaneous nephrostomy tube was then advanced and formed in the collecting system. Catheter position was confirmed by fluoroscopy after contrast injection. The catheter was secured at the skin with a Prolene retention suture and Stat-Lock device. A gravity bag was placed. COMPLICATIONS: None. FINDINGS: Ultrasound demonstrates moderate hydronephrosis. Via interpolar caliceal access, a 10 French nephrostomy tube was advanced and formed in the renal pelvis. There is good return of urine from the nephrostomy tube after placement. IMPRESSION: Placement of 10 French left percutaneous nephrostomy tube via interpolar caliceal access. The tube was formed in the renal pelvis and connected to gravity bag drainage. Electronically Signed   By: Aletta Edouard M.D.   On: 06/18/2020 08:37    Labs:  CBC: Recent Labs    06/08/20 0520 06/17/20 1204 06/18/20 0621 06/19/20 0347  WBC 10.6* 11.3* 7.1 6.1  HGB 8.5* 9.4* 8.3* 7.9*  HCT 25.6* 30.0* 26.4* 24.1*  PLT 215 327 263 281    COAGS: Recent Labs    06/17/20 1638  INR 1.1    BMP: Recent Labs    06/08/20 0520 06/17/20 1204 06/18/20 0621 06/19/20 0347  NA 140 130* 133* 135  K 3.6 6.0* 4.5 3.7  CL 111 101 103 103  CO2 20* 20* 22 25  GLUCOSE 105* 88 92 94  BUN 37* 46* 41* 25*  CALCIUM 6.7* 8.4* 7.8* 7.3*  CREATININE 2.81* 6.87* 5.31* 3.40*  GFRNONAA  18* 6* 8* 14*    LIVER FUNCTION TESTS: Recent Labs    06/03/20 1613 06/17/20 1204 06/18/20 0621 06/19/20 0347  BILITOT 0.7 0.5 0.5 0.2*  AST 32 13* 13* 14*  ALT 31 10 8 7   ALKPHOS 55 63 54 55  PROT 6.8 7.0 5.8* 5.9*  ALBUMIN 3.8 3.4* 2.8* 2.7*    Assessment and Plan: Patient with history of metastatic breast carcinoma, bilateral hydronephrosis, bilateral ureteral stent placements, worsening renal function, acute left flank pain; status post left percutaneous nephrostomy on 11/8; afebrile, WBC normal, hemoglobin 7.9(8.3), creatinine 3.4 down from 5.3; continue current treatment, lab checks; further plans as per urology; OOB   Electronically Signed: D. Rowe Robert, PA-C 06/19/2020, 1:16 PM   I spent a total of 15 minutes at the the patient's bedside AND on the patient's hospital floor or unit, greater than 50% of which was counseling/coordinating care for left nephrostomy    Patient ID: [name], female   DOB: June 25, 1951, 69 y.o.   MRN: [id number]

## 2020-06-19 NOTE — Consult Note (Signed)
Urology Inpatient Progress Report  Acute renal failure (ARF) (HCC) [N17.9] Hydronephrosis, left [N13.30] Hydronephrosis, unspecified hydronephrosis type [N13.30] Acute renal failure, unspecified acute renal failure type ([place]) [N17.9]     Intv/Subj: No acute events overnight. Patient is having persistent nausea, lethargy, and severe depression.  Active Problems:   Acute renal failure (ARF) (HCC)  Current Facility-Administered Medications  Medication Dose Route Frequency Provider Last Rate Last Admin  . 0.9 %  sodium chloride infusion   Intravenous Continuous Cherene Altes, MD 50 mL/hr at 06/19/20 0459 New Bag at 06/19/20 0459  . acetaminophen (TYLENOL) tablet 1,000 mg  1,000 mg Oral Q6H PRN Cherene Altes, MD   1,000 mg at 06/18/20 1957  . buPROPion (WELLBUTRIN SR) 12 hr tablet 100 mg  100 mg Oral BID Cherene Altes, MD   100 mg at 06/18/20 2101  . Chlorhexidine Gluconate Cloth 2 % PADS 6 each  6 each Topical Daily Cherene Altes, MD   6 each at 06/18/20 1230  . clonazePAM (KLONOPIN) tablet 0.5 mg  0.5 mg Oral BID PRN Joette Catching T, MD      . fentaNYL (SUBLIMAZE) injection 12.5-25 mcg  12.5-25 mcg Intravenous Q2H PRN Cherene Altes, MD   25 mcg at 06/17/20 1634  . heparin injection 5,000 Units  5,000 Units Subcutaneous Q8H Cherene Altes, MD   5,000 Units at 06/19/20 0500  . letrozole [facility]) tablet 2.5 mg  2.5 mg Oral Daily Cherene Altes, MD   2.5 mg at 06/18/20 0845  . metoprolol succinate (TOPROL-XL) 24 hr tablet 25 mg  25 mg Oral Daily Cherene Altes, MD   25 mg at 06/18/20 0846  . ondansetron (ZOFRAN) tablet 4 mg  4 mg Oral Q6H PRN Cherene Altes, MD       Or  . ondansetron [facility]) injection 4 mg  4 mg Intravenous Q6H PRN Cherene Altes, MD   4 mg at 06/19/20 0834  . oxyCODONE (Oxy IR/ROXICODONE) immediate release tablet 5 mg  5 mg Oral Q4H PRN Cherene Altes, MD   5 mg at 06/17/20 2013  . pantoprazole (PROTONIX) EC tablet 40 mg   40 mg Oral BID Cherene Altes, MD   40 mg at 06/18/20 2101  . polyethylene glycol (MIRALAX / GLYCOLAX) packet 17 g  17 g Oral Daily PRN Joette Catching T, MD      . sodium chloride flush (NS) 0.9 % injection 10-40 mL  10-40 mL Intracatheter Q12H Cherene Altes, MD   10 mL at 06/18/20 2102  . sodium chloride flush (NS) 0.9 % injection 10-40 mL  10-40 mL Intracatheter PRN Joette Catching T, MD      . sodium chloride flush (NS) 0.9 % injection 5 mL  5 mL Intracatheter Q8H Aletta Edouard, MD   5 mL at 06/18/20 1400  . tamsulosin (FLOMAX) capsule 0.4 mg  0.4 mg Oral BID Cherene Altes, MD   0.4 mg at 06/18/20 2102  . vitamin B-12 (CYANOCOBALAMIN) tablet 1,000 mcg  1,000 mcg Oral BID Joette Catching T, MD   1,000 mcg at 06/18/20 2101     Objective: Vital: Vitals:   06/18/20 1309 06/18/20 1400 06/18/20 2107 06/19/20 0500  BP: (!) 143/66  134/61 133/76  Pulse: 73  83 79  Resp: 17  18 16   Temp: 98.7 F (37.1 C) 98.8 F (37.1 C) 98.7 F (37.1 C) 98.3 F (36.8 C)  TempSrc:  Oral Oral Oral  SpO2: 100%  96% 99%  Weight:      Height:       I/Os: I/O last 3 completed shifts: In: 6948 [P.O.:720; I.V.:4535; Other:15] Out: 6125 [Urine:6125]  Physical Exam:  General: Patient is in no apparent distress Lungs: Normal respiratory effort, chest expands symmetrically. GI: Left neph tube appears to be draining well.  No real significant abdominal pain.   Ext: lower extremities symmetric  Lab Results: Recent Labs    06/17/20 1204 06/18/20 0621 06/19/20 0347  WBC 11.3* 7.1 6.1  HGB 9.4* 8.3* 7.9*  HCT 30.0* 26.4* 24.1*   Recent Labs    06/17/20 1204 06/18/20 0621 06/19/20 0347  NA 130* 133* 135  K 6.0* 4.5 3.7  CL 101 103 103  CO2 20* 22 25  GLUCOSE 88 92 94  BUN 46* 41* 25*  CREATININE 6.87* 5.31* 3.40*  CALCIUM 8.4* 7.8* 7.3*   Recent Labs    06/17/20 1638  INR 1.1   No results for input(s): LABURIN in the last 72 hours. Results for orders placed or  performed during the hospital encounter of 06/17/20  Respiratory Panel by RT PCR (Flu A&B, Covid) - Nasopharyngeal Swab     Status: None   Collection Time: 06/17/20  2:26 PM   Specimen: Nasopharyngeal Swab  Result Value Ref Range Status   SARS Coronavirus 2 by RT PCR NEGATIVE NEGATIVE Final    Comment: (NOTE) SARS-CoV-2 target nucleic acids are NOT DETECTED.  The SARS-CoV-2 RNA is generally detectable in upper respiratoy specimens during the acute phase of infection. The lowest concentration of SARS-CoV-2 viral copies this assay can detect is 131 copies/mL. A negative result does not preclude SARS-Cov-2 infection and should not be used as the sole basis for treatment or other patient management decisions. A negative result may occur with  improper specimen collection/handling, submission of specimen other than nasopharyngeal swab, presence of viral mutation(s) within the areas targeted by this assay, and inadequate number of viral copies (<131 copies/mL). A negative result must be combined with clinical observations, patient history, and epidemiological information. The expected result is Negative.  Fact Sheet for Patients:  PinkCheek.be  Fact Sheet for Healthcare Providers:  GravelBags.it  This test is no t yet approved or cleared by the [place] FDA and  has been authorized for detection and/or diagnosis of SARS-CoV-2 by FDA under an Emergency Use Authorization (EUA). This EUA will remain  in effect (meaning this test can be used) for the duration of the COVID-19 declaration under Section 564(b)(1) of the Act, 21 U.S.C. section 360bbb-3(b)(1), unless the authorization is terminated or revoked sooner.     Influenza A by PCR NEGATIVE NEGATIVE Final   Influenza B by PCR NEGATIVE NEGATIVE Final    Comment: (NOTE) The Xpert Xpress SARS-CoV-2/FLU/RSV assay is intended as an aid in  the diagnosis of influenza from  Nasopharyngeal swab specimens and  should not be used as a sole basis for treatment. Nasal washings and  aspirates are unacceptable for Xpert Xpress SARS-CoV-2/FLU/RSV  testing.  Fact Sheet for Patients: PinkCheek.be  Fact Sheet for Healthcare Providers: GravelBags.it  This test is not yet approved or cleared by the [place] FDA and  has been authorized for detection and/or diagnosis of SARS-CoV-2 by  FDA under an Emergency Use Authorization (EUA). This EUA will remain  in effect (meaning this test can be used) for the duration of the  Covid-19 declaration under Section 564(b)(1) of the Act, 21  U.S.C. section 360bbb-3(b)(1), unless the authorization is  terminated or revoked. Performed at [facility] [place], [place] [address] [place],  [address]     Studies/Results: DG Abdomen 1 View  Result Date: 06/17/2020 CLINICAL DATA:  Abdominal pain EXAM: ABDOMEN - 1 VIEW COMPARISON:  CT abdomen and pelvis June 06, 2020. FINDINGS: Double-J stents are present bilaterally, stable. Ostomy left mid abdomen, unchanged. No abnormal calcifications demonstrable. No bowel dilatation or air-fluid level to suggest bowel obstruction. No free air on supine examination. IMPRESSION: Stable appearing double-J stents, unchanged in positions. Ostomy left mid abdomen. No abnormal calcification. No bowel obstruction or free air evident. Electronically Signed   By: Lowella Grip III M.D.   On: 06/17/2020 13:42   US Renal  Result Date: 06/17/2020 CLINICAL DATA:  Progressive flank region pain EXAM: RENAL / URINARY TRACT ULTRASOUND COMPLETE COMPARISON:  Renal ultrasound June 05, 2020; CT abdomen and pelvis June 06, 2020 FINDINGS: Right Kidney: Renal measurements: 11.0 x 6.0 x 7.0 cm = volume: 267 mL. Echogenicity and renal cortical thickness are within normal limits. No mass or perinephric fluid visualized. Scarring lateral  right kidney noted. There is moderate hydronephrosis on the right. A stent is present extending from the right renal pelvis extending to the bladder. Left Kidney: Renal measurements: 11.5 x 6.5 x 5.5 cm = volume: 215 mL. Echogenicity and renal cortical thickness are within normal limits. No mass or perinephric fluid visualized. Moderate hydronephrosis. Double-J stent extending from left renal pelvis to the bladder level. Bladder: Double-J stents in bladder.  Bladder virtually empty. Other: None. IMPRESSION: Double-J stents present bilaterally. Despite presence of stents, moderate hydronephrosis is present on each side. Scarring lateral right kidney. Study otherwise unremarkable. Electronically Signed   By: Lowella Grip III M.D.   On: 06/17/2020 13:47   CT Renal Stone Study  Result Date: 06/17/2020 CLINICAL DATA:  Follow-up hydronephrosis. Worsening left flank pain. EXAM: CT ABDOMEN AND PELVIS WITHOUT CONTRAST TECHNIQUE: Multidetector CT imaging of the abdomen and pelvis was performed following the standard protocol without IV contrast. COMPARISON:  06/06/2020 FINDINGS: Lower chest: Resolution of left effusion. Abnormal interstitial pulmonary density persists. Hepatobiliary: Normal without contrast. Pancreas: Normal Spleen: Normal Adrenals/Urinary Tract: Adrenal glands are normal. Bilateral ureteral stents remain in place, with the ends in the renal collecting system and bladder. Considerable reduction in perinephric edema previously seen on the left. Moderate fullness of the left renal collecting system does persist. Mild fullness of the right renal collecting system persists. Stomach/Bowel: Previous colostomy and distal colectomy. Soft tissue thickening in the presacral soft tissues appears unchanged. Vascular/Lymphatic: Aortic atherosclerosis. No aneurysm. IVC is normal. No retroperitoneal adenopathy is seen. Reproductive: Previous hysterectomy. Other: No free fluid or air. Musculoskeletal: Scattered  osseous metastatic lesions are not significantly changed since the recent examinations. IMPRESSION: 1. Bilateral ureteral stents remain in place, with the ends in the renal collecting systems and bladder. Considerable reduction in perinephric edema previously seen on the left. Moderate fullness of the left renal collecting system does persist. Mild fullness of the right renal collecting system persists. 2. Previous colostomy and distal colectomy. Soft tissue thickening in the presacral soft tissues appears unchanged. Scattered osseous metastatic lesions are not significantly changed. 3. Resolution of left effusion. Abnormal pulmonary interstitial pulmonary density persists. 4. Aortic atherosclerosis. Aortic Atherosclerosis (ICD10-I70.0). Electronically Signed   By: Nelson Chimes M.D.   On: 06/17/2020 15:09   IR NEPHROSTOMY PLACEMENT LEFT  Result Date: 06/18/2020 CLINICAL DATA:  History of metastatic breast carcinoma, bilateral hydronephrosis, bilateral ureteral stent placement and worsening renal  function with acute left flank pain. Due to worsening hydronephrosis and acute left flank pain, decision has been made to place a left percutaneous nephrostomy tube at this time. EXAM: 1. ULTRASOUND GUIDANCE FOR PUNCTURE OF THE LEFT RENAL COLLECTING SYSTEM. 2. LEFT PERCUTANEOUS NEPHROSTOMY TUBE PLACEMENT. COMPARISON:  CT of the abdomen and pelvis as well as renal ultrasound earlier today. ANESTHESIA/SEDATION: 3.0 mg IV Versed; 100 mcg IV Fentanyl. Total Moderate Sedation Time 18 minutes. Moderate (conscious) sedation was employed during this procedure. A total of Versed 3.0 mg and Fentanyl 100 mcg was administered intravenously. The patient's level of consciousness and vital signs were monitored continuously by radiology nursing throughout the procedure under my direct supervision. CONTRAST:  12 ml Omnipaque 300 MEDICATIONS: 2 g IV Ancef. Antibiotic was administered in an appropriate time frame prior to skin puncture.  FLUOROSCOPY TIME:  1 minutes and 18 seconds.  8.0 mGy. PROCEDURE: The procedure, risks, benefits, and alternatives were explained to the patient. Questions regarding the procedure were encouraged and answered. The patient understands and consents to the procedure. A time-out was performed prior to initiating the procedure. Ultrasound was utilized to localize the left kidney. The left flank region was prepped with chlorhexidine in a sterile fashion, and a sterile drape was applied covering the operative field. A sterile gown and sterile gloves were used for the procedure. Local anesthesia was provided with 1% Lidocaine. Under direct ultrasound guidance, a 21 gauge needle was advanced into the renal collecting system. Ultrasound image documentation was performed. Aspiration of urine sample was performed followed by contrast injection. A transitional dilator was advanced over a guidewire. Percutaneous tract dilatation was then performed over the guidewire. A 10-French percutaneous nephrostomy tube was then advanced and formed in the collecting system. Catheter position was confirmed by fluoroscopy after contrast injection. The catheter was secured at the skin with a Prolene retention suture and Stat-Lock device. A gravity bag was placed. COMPLICATIONS: None. FINDINGS: Ultrasound demonstrates moderate hydronephrosis. Via interpolar caliceal access, a 10 French nephrostomy tube was advanced and formed in the renal pelvis. There is good return of urine from the nephrostomy tube after placement. IMPRESSION: Placement of 10 French left percutaneous nephrostomy tube via interpolar caliceal access. The tube was formed in the renal pelvis and connected to gravity bag drainage. Electronically Signed   By: Aletta Edouard M.D.   On: 06/18/2020 08:37    Assessment: The patient's left kidney was clearly obstructed and now appears to be functioning.  Her renal function is improving and her pain has nearly resolved.  She  continues to have severe nausea.  She also is suffering from lethargy and depression.  Her appetite is poor.  Given her presentation of anuria prior to admission, I am concerned that the right kidney is not functioning at all.  There was no hydronephrosis on the CT scan, and the stent appeared to be in appropriate position.  Now that the nephrostomy tube is in place on the left, the left kidney is producing urine, but there is no urine in her bladder.  Plan: We discussed treatment options at this point which would include exchanging the stent, or getting a PET CT scan to evaluate for metastasis.  I am not sure that a stent would change the trajectory of the kidney on the right, a PET CT scan may give [place] a better idea of whether that kidney is functioning at all.  I have reached out to oncology, and will await their input if possible.   Marland Kitchen  Louis Meckel, MD Urology 06/19/2020, 8:51 AM

## 2020-06-19 NOTE — Progress Notes (Signed)
PROGRESS NOTE  [name] WUX:[id number] DOB: 03-Aug-1951 DOA: 06/17/2020 PCP: Celene Squibb, MD  HPI/Recap of past 18 hours: 69 year old with a history of metastatic breast cancer currently onachemo holiday, BRCA2 mutation,anxiety/depression, remote anal cancer, CKD stage IIIa, HTN,and recent admission for obstructive uropathy and acute kidney injury requiring bilateral ureteral stent placement who was discharged home 10/30. Patient reported she was doing well until 4 days prior to this admission when she began to experience the recurrence of flank pain. This pain only worsened despite use of narcotic pain medications and became associated with dry heaving and severe nausea. She also appreciated decreased urinary output. She spoke to her urologist office over the weekend and was told to present to the ED if she began to experience fevers. Today when her symptoms persisted/worsened she decided to come to the ED for evaluation.  In the ED she was found to be suffering with severe acute renal failure, hyperkalemia, and renal ultrasound noted moderate bilateral hydronephrosis despite presence of ureteral stents.  06/19/20: Mild left flank pain.  Hematuria noted on left nephrostomy collecting bag.  Persistent nonproductive cough with mild rales noted on exam, will obtain a chest x-ray.  Assessment/Plan: Active Problems:   Acute renal failure (ARF) (HCC)  Recurrent obstructive uropathy Status post left percutaneous nephrostomy tube 11/8 in IR  -appears to be draining well at present -nephrostomy care per IR and decision on whether right nephrostomy indicated per urology  Acute kidney failure on CKD stage IIIa Creatinine 2.81 at time of discharge 10/30-creatinine is presently trending downward -continue to follow in a serial fashion  Last Labs       Recent Labs  Lab 06/17/20 1204 06/18/20 0621  CREATININE 6.87* 5.31*     Creatinine 3.40 with GFR of 14 on 06/19/2020 Continue  to avoid nephrotoxins Continue IV fluid hydration  Hematuria, noted on 06/19/2020 Urology following Management per neurology Continue IV fluids  Persistent nonproductive cough, unclear etiology Obtain a chest x-ray  Resolved severe hyperkalemia Received Lokelma and isotonic bicarb. Serum potassium 3.7 on 06/19/2020.  Hyponatremia, improving with IV fluid. Serum sodium 135.  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  Patient currently is not medically stable to d/c, pending urology sign off.   Consultants:  Urology IR        Objective: Vitals:   06/18/20 1309 06/18/20 1400 06/18/20 2107 06/19/20 0500  BP: (!) 143/66  134/61 133/76  Pulse: 73  83 79  Resp: '17  18 16  ' Temp: 98.7 F (37.1 C) 98.8 F (37.1 C) 98.7 F (37.1 C) 98.3 F (36.8 C)  TempSrc:  Oral Oral Oral  SpO2: 100%  96% 99%  Weight:      Height:        Intake/Output Summary (Last 24 hours) at 06/19/2020 0952 Last data filed at 06/19/2020 0700 Gross per 24 hour  Intake 3463.44 ml  Output 4075 ml  Net -611.56 ml   Filed Weights   06/17/20 1005 06/17/20 1834  Weight: 59.8 kg  55.9 kg    Exam:  . General: 69 y.o. year-old female well developed well nourished in no acute distress.  Alert and oriented x3. . Cardiovascular: Regular rate and rhythm with no rubs or gallops.  No thyromegaly or JVD noted.     Marland Kitchen Respiratory: Mild rales at bases.  Good inspiratory effort. . Abdomen: Soft nontender nondistended with normal bowel sounds x4 quadrants.  Ostomy bag left mid abdomen. . Musculoskeletal: No lower extremity edema. 2/4 pulses in all 4 extremities. . Skin: No ulcerative lesions noted or rashes    . Psychiatry: Mood is appropriate for condition and setting   Data Reviewed: CBC: Recent Labs  Lab 06/17/20 1204 06/18/20 0621 06/19/20 0347  WBC 11.3* 7.1 6.1  NEUTROABS 10.0*  --   --   HGB 9.4* 8.3* 7.9*  HCT 30.0* 26.4* 24.1*  MCV 106.0* 107.3* 103.9*  PLT 327 263 553   Basic Metabolic Panel: Recent Labs  Lab 06/17/20 1204 06/18/20 0621 06/19/20 0347  NA 130* 133* 135  K 6.0* 4.5 3.7  CL 101 103 103  CO2 20* 22 25  GLUCOSE 88 92 94  BUN 46* 41* 25*  CREATININE 6.87* 5.31* 3.40*  CALCIUM 8.4* 7.8* 7.3*   GFR: Estimated Creatinine Clearance: 14.2 mL/min (A) (by C-G formula based on SCr of 3.4 mg/dL (H)). Liver Function Tests: Recent Labs  Lab 06/17/20 1204 06/18/20 0621 06/19/20 0347  AST 13* 13* 14*  ALT '10 8 7  ' ALKPHOS 63 54 55  BILITOT 0.5 0.5 0.2*  PROT 7.0 5.8* 5.9*  ALBUMIN 3.4* 2.8* 2.7*   Recent Labs  Lab 06/17/20 1204  LIPASE 26   No results for input(s): AMMONIA in the last 168 hours. Coagulation Profile: Recent Labs  Lab 06/17/20 1638  INR 1.1   Cardiac Enzymes: No results for input(s): CKTOTAL, CKMB, CKMBINDEX, TROPONINI in the last 168 hours. BNP (last 3 results) No results for input(s): PROBNP in the last 8760 hours. HbA1C: No results for input(s): HGBA1C in the last 72 hours. CBG: No results for input(s): GLUCAP in the last 168 hours. Lipid Profile: No results for input(s): CHOL, HDL, LDLCALC, TRIG, CHOLHDL, LDLDIRECT in the last 72 hours. Thyroid Function Tests: No results for input(s): TSH, T4TOTAL, FREET4, T3FREE, THYROIDAB in the last 72 hours. Anemia Panel: No results for input(s): VITAMINB12, FOLATE, FERRITIN, TIBC, IRON,  RETICCTPCT in the last 72 hours. Urine analysis:    Component Value Date/Time   COLORURINE STRAW (A) 06/03/2020 1613   APPEARANCEUR CLEAR 06/03/2020 1613   LABSPEC 1.008 06/03/2020 1613   PHURINE 6.0 06/03/2020 1613   GLUCOSEU NEGATIVE 06/03/2020 1613   HGBUR NEGATIVE 06/03/2020 1613   BILIRUBINUR NEGATIVE 06/03/2020 1613   KETONESUR NEGATIVE 06/03/2020 1613   PROTEINUR NEGATIVE 06/03/2020 1613   UROBILINOGEN 0.2 08/06/2008 1500   NITRITE NEGATIVE 06/03/2020 1613   LEUKOCYTESUR NEGATIVE 06/03/2020 1613   Sepsis Labs: '@LABRCNTIP' (procalcitonin:4,lacticidven:4)  ) Recent Results (from the past 240 hour(s))  Respiratory Panel by RT PCR (Flu A&B, Covid) - Nasopharyngeal Swab     Status: None   Collection Time: 06/17/20  2:26 PM   Specimen: Nasopharyngeal Swab  Result Value Ref Range Status   SARS Coronavirus 2 by RT PCR NEGATIVE NEGATIVE Final    Comment: (NOTE) SARS-CoV-2 target nucleic acids are NOT DETECTED.  The SARS-CoV-2 RNA is generally detectable in upper respiratoy specimens during the acute phase of infection. The lowest concentration of SARS-CoV-2 viral copies this assay can detect is 131 copies/mL. A negative result  does not preclude SARS-Cov-2 infection and should not be used as the sole basis for treatment or other patient management decisions. A negative result may occur with  improper specimen collection/handling, submission of specimen other than nasopharyngeal swab, presence of viral mutation(s) within the areas targeted by this assay, and inadequate number of viral copies (<131 copies/mL). A negative result must be combined with clinical observations, patient history, and epidemiological information. The expected result is Negative.  Fact Sheet for Patients:  PinkCheek.be  Fact Sheet for Healthcare Providers:  GravelBags.it  This test is no t yet approved or cleared by the [place] FDA and    has been authorized for detection and/or diagnosis of SARS-CoV-2 by FDA under an Emergency Use Authorization (EUA). This EUA will remain  in effect (meaning this test can be used) for the duration of the COVID-19 declaration under Section 564(b)(1) of the Act, 21 U.S.C. section 360bbb-3(b)(1), unless the authorization is terminated or revoked sooner.     Influenza A by PCR NEGATIVE NEGATIVE Final   Influenza B by PCR NEGATIVE NEGATIVE Final    Comment: (NOTE) The Xpert Xpress SARS-CoV-2/FLU/RSV assay is intended as an aid in  the diagnosis of influenza from Nasopharyngeal swab specimens and  should not be used as a sole basis for treatment. Nasal washings and  aspirates are unacceptable for Xpert Xpress SARS-CoV-2/FLU/RSV  testing.  Fact Sheet for Patients: PinkCheek.be  Fact Sheet for Healthcare Providers: GravelBags.it  This test is not yet approved or cleared by the [place] FDA and  has been authorized for detection and/or diagnosis of SARS-CoV-2 by  FDA under an Emergency Use Authorization (EUA). This EUA will remain  in effect (meaning this test can be used) for the duration of the  Covid-19 declaration under Section 564(b)(1) of the Act, 21  U.S.C. section 360bbb-3(b)(1), unless the authorization is  terminated or revoked. Performed at [facility], [place] [address], [place], [place] [address]       Studies: No results found.  Scheduled Meds: . buPROPion  100 mg Oral BID  . Chlorhexidine Gluconate Cloth  6 each Topical Daily  . heparin injection (subcutaneous)  5,000 Units Subcutaneous Q8H  . letrozole  2.5 mg Oral Daily  . metoprolol succinate  25 mg Oral Daily  . pantoprazole  40 mg Oral BID  . sodium chloride flush  10-40 mL Intracatheter Q12H  . sodium chloride flush  5 mL Intracatheter Q8H  . tamsulosin  0.4 mg Oral BID  . vitamin B-12  1,000 mcg Oral BID    Continuous  Infusions: . sodium chloride 50 mL/hr at 06/19/20 0459     LOS: 2 days     Kayleen Memos, MD [facility] Pager [phone]  If 7PM-7AM, please contact night-coverage [email or web address] Password [facility] 06/19/2020, 9:52 AM

## 2020-06-19 NOTE — Progress Notes (Signed)
Initial Nutrition Assessment  DOCUMENTATION CODES:   Non-severe (moderate) malnutrition in context of chronic illness  INTERVENTION:  - will order Boost Breeze once/day, each supplement provides 250 kcal and 9 grams of protein. - will order Boost Plus once/day, each supplement provides 360 kcal and 14 grams of protein. - will order Ensure Plus once/day, each supplement provides 350 kcal and 16 grams of protein. - will order 1 tablet multivitamin with minerals/day.  - may consider trial of marinol.    NUTRITION DIAGNOSIS:   Moderate Malnutrition related to chronic illness, cancer and cancer related treatments as evidenced by moderate fat depletion, moderate muscle depletion.  GOAL:   Patient will meet greater than or equal to 90% of their needs  MONITOR:   PO intake, Supplement acceptance, Labs, Weight trends  REASON FOR ASSESSMENT:   Malnutrition Screening Tool    ASSESSMENT:   69 year old female with medical history of metastatic breast cancer currently on a chemo holiday, BRCA2 mutation, anxiety, depression, remote anal cancer, stage 3 CKD, HTN, and recent admission for obstructive uropathy and AKI requiring bilateral ureteral stent placement who was discharged 10/30. She presented to the ED with recurrence of flank pain x4 days which worsened despite narcotic use. Pain was associated with severe nausea and dry heaving. In the ED, she was found to be in ARF, hyperkalemia, and renal ultrasound indicated moderate bilateral hydronephrosis.  Documented intakes in the chart are 100% of breakfast, 50% of lunch, 100% of dinner yesterday; 100% of breakfast and dinner today.  Patient laying in bed with no family/visitors at the time of visit this afternoon. She reports that she has had a very poor appetite for ~8 weeks. During this time she has also been experienced decreased taste sensation (gives the example of pumpkin bread brought in by a friend not being as "spiced" tasting as she  knows her friend's pumpkin bread to be).   Patient lives alone and last week had to put down her horse and one of her cats. She does feel that when others are present to eat with her for a meal, she will eat more/better.   She continues to have L flank pain but it has improved. She has also noted improvement in her nausea and not dry heaving any more. She never vomited despite severity of nausea.   At home she has zofran, which helps to some extent. She was also consuming edibles (caramels with marijuana) but this had about the same level of affect on her nausea and did not aid in any way with her appetite.   She has noticed feeling short of breath recently, including today, with ambulation. PTA, she also noted weakness and gives the example of being able to throw 50 lb hay bales previously but over the past few weeks has had to push them instead.  At home she was consuming smoothies for awhile but got burnt out on these. She has also been favoring items such as sherbet and pudding. At home, she was consuming [employer] and similar supplemental drinks and is open to Ensure/Boost between meals at this time.   Weight on 11/8 was documented as both 123 lb and 132 lb. Patient reports UBW of ~132 lb and that she reported this to staff in the ED and that she recalls someone weighing her and mentioning 123 lb. Weight on 10/29 was her UBW of 132 lb. This indicates 9 lb weight loss (6.8% body weight) in the past 2 weeks.    Labs  reviewed; BUN: 25 mg/dl, creatinine: 3.4 mg/dl, Ca: 7.3 mg/dl, GFR: 14 ml/min. Medications reviewed; 40 mg oral protonix BID, 1000 mcg oral cyanocobalamin BID.  IVF; NS @ 50 ml/hr.      NUTRITION - FOCUSED PHYSICAL EXAM:    Most Recent Value  Orbital Region Mild depletion  Upper Arm Region Moderate depletion  Thoracic and Lumbar Region Unable to assess  Buccal Region Moderate depletion  Temple Region Mild depletion  Clavicle Bone Region Moderate depletion   Clavicle and Acromion Bone Region Moderate depletion  Scapular Bone Region Moderate depletion  Dorsal Hand Mild depletion  Patellar Region Unable to assess  Anterior Thigh Region Unable to assess  Posterior Calf Region Unable to assess  Edema (RD Assessment) Unable to assess  Hair Reviewed  Eyes Reviewed  Mouth Reviewed  Skin Reviewed  Nails Reviewed       Diet Order:   Diet Order            Diet regular Room service appropriate? Yes; Fluid consistency: Thin  Diet effective now                 EDUCATION NEEDS:   No education needs have been identified at this time  Skin:  Skin Assessment: Reviewed RN Assessment  Last BM:  PTA/unknown  Height:   Ht Readings from Last 1 Encounters:  06/17/20 '5\' 5"'  (1.651 m)    Weight:   Wt Readings from Last 1 Encounters:  06/17/20 55.9 kg     Estimated Nutritional Needs:  Kcal:  1790-1960 kcal Protein:  90-105 grams Fluid:  >/= 2 L/day     Jarome Matin, MS, RD, LDN, CNSC Inpatient Clinical Dietitian RD pager # available in [facility]  After hours/weekend pager # available in [facility]

## 2020-06-20 DIAGNOSIS — E440 Moderate protein-calorie malnutrition: Secondary | ICD-10-CM | POA: Insufficient documentation

## 2020-06-20 DIAGNOSIS — R110 Nausea: Secondary | ICD-10-CM

## 2020-06-20 DIAGNOSIS — C50919 Malignant neoplasm of unspecified site of unspecified female breast: Secondary | ICD-10-CM

## 2020-06-20 LAB — COMPREHENSIVE METABOLIC PANEL
ALT: 7 U/L (ref 0–44)
AST: 14 U/L — ABNORMAL LOW (ref 15–41)
Albumin: 2.9 g/dL — ABNORMAL LOW (ref 3.5–5.0)
Alkaline Phosphatase: 57 U/L (ref 38–126)
Anion gap: 9 (ref 5–15)
BUN: 15 mg/dL (ref 8–23)
CO2: 24 mmol/L (ref 22–32)
Calcium: 7.7 mg/dL — ABNORMAL LOW (ref 8.9–10.3)
Chloride: 105 mmol/L (ref 98–111)
Creatinine, Ser: 1.93 mg/dL — ABNORMAL HIGH (ref 0.44–1.00)
GFR, Estimated: 28 mL/min — ABNORMAL LOW (ref >60)
Glucose, Bld: 121 mg/dL — ABNORMAL HIGH (ref 70–99)
Potassium: 3.4 mmol/L — ABNORMAL LOW (ref 3.5–5.1)
Sodium: 138 mmol/L (ref 135–145)
Total Bilirubin: 0.4 mg/dL (ref 0.3–1.2)
Total Protein: 6.3 g/dL — ABNORMAL LOW (ref 6.5–8.1)

## 2020-06-20 LAB — CBC
HCT: 25.3 % — ABNORMAL LOW (ref 36.0–46.0)
Hemoglobin: 8.2 g/dL — ABNORMAL LOW (ref 12.0–15.0)
MCH: 33.2 pg (ref 26.0–34.0)
MCHC: 32.4 g/dL (ref 30.0–36.0)
MCV: 102.4 fL — ABNORMAL HIGH (ref 80.0–100.0)
Platelets: 315 10*3/uL (ref 150–400)
RBC: 2.47 MIL/uL — ABNORMAL LOW (ref 3.87–5.11)
RDW: 12.2 % (ref 11.5–15.5)
WBC: 5.5 10*3/uL (ref 4.0–10.5)
nRBC: 0 % (ref 0.0–0.2)

## 2020-06-20 LAB — SODIUM, URINE, RANDOM: Sodium, Ur: 150 mmol/L

## 2020-06-20 MED ORDER — POTASSIUM CHLORIDE CRYS ER 20 MEQ PO TBCR
40.0000 meq | EXTENDED_RELEASE_TABLET | Freq: Once | ORAL | Status: AC
  Administered 2020-06-20: 40 meq via ORAL
  Filled 2020-06-20: qty 2

## 2020-06-20 MED ORDER — PROCHLORPERAZINE MALEATE 10 MG PO TABS
5.0000 mg | ORAL_TABLET | Freq: Three times a day (TID) | ORAL | Status: DC
  Administered 2020-06-20 – 2020-06-22 (×9): 5 mg via ORAL
  Filled 2020-06-20 (×10): qty 1

## 2020-06-20 MED ORDER — POLYETHYLENE GLYCOL 3350 17 G PO PACK
17.0000 g | PACK | Freq: Every day | ORAL | Status: DC
  Filled 2020-06-20 (×2): qty 1

## 2020-06-20 MED ORDER — METOPROLOL SUCCINATE ER 25 MG PO TB24
25.0000 mg | ORAL_TABLET | Freq: Two times a day (BID) | ORAL | Status: DC
  Administered 2020-06-20 – 2020-06-22 (×4): 25 mg via ORAL
  Filled 2020-06-20 (×4): qty 1

## 2020-06-20 MED ORDER — DOCUSATE SODIUM 100 MG PO CAPS
100.0000 mg | ORAL_CAPSULE | Freq: Every day | ORAL | Status: DC
  Administered 2020-06-20: 100 mg via ORAL
  Filled 2020-06-20 (×2): qty 1

## 2020-06-20 NOTE — Progress Notes (Signed)
Urology Inpatient Progress Report  Acute renal failure (ARF) (HCC) [N17.9] Hydronephrosis, left [N13.30] Hydronephrosis, unspecified hydronephrosis type [N13.30] Acute renal failure, unspecified acute renal failure type ([place]) [N17.9]    Intv/Subj: No acute events overnight. Continues to have nausea  Voiding on her own now  Active Problems:   Acute renal failure (ARF) (HCC)   Malnutrition of moderate degree  Current Facility-Administered Medications  Medication Dose Route Frequency Provider Last Rate Last Admin  . 0.9 %  sodium chloride infusion   Intravenous Continuous Cherene Altes, MD 50 mL/hr at 06/20/20 0027 New Bag at 06/20/20 0027  . acetaminophen (TYLENOL) tablet 1,000 mg  1,000 mg Oral Q6H PRN Cherene Altes, MD   1,000 mg at 06/20/20 0734  . buPROPion (WELLBUTRIN SR) 12 hr tablet 100 mg  100 mg Oral BID Cherene Altes, MD   100 mg at 06/19/20 2153  . Chlorhexidine Gluconate Cloth 2 % PADS 6 each  6 each Topical Daily Cherene Altes, MD   6 each at 06/19/20 1030  . clonazePAM (KLONOPIN) tablet 0.5 mg  0.5 mg Oral BID PRN Cherene Altes, MD      . docusate sodium (COLACE) capsule 100 mg  100 mg Oral Daily Magrinat, Virgie Dad, MD      . feeding supplement (BOOST / RESOURCE BREEZE) liquid 1 Container  1 Container Oral Q24H Hall, Carole N, DO      . feeding supplement (ENSURE ENLIVE / ENSURE PLUS) liquid 237 mL  237 mL Oral Q24H Irene Pap N, DO   237 mL at 06/19/20 2150  . heparin injection 5,000 Units  5,000 Units Subcutaneous Q8H Cherene Altes, MD   5,000 Units at 06/20/20 0618  . lactose free nutrition (BOOST PLUS) liquid 237 mL  237 mL Oral Q24H Hall, Carole N, DO      . metoprolol succinate (TOPROL-XL) 24 hr tablet 25 mg  25 mg Oral Daily Cherene Altes, MD   25 mg at 06/19/20 1001  . multivitamin with minerals tablet 1 tablet  1 tablet Oral Daily Kayleen Memos, DO   1 tablet at 06/19/20 1802  . ondansetron (ZOFRAN) tablet 4 mg  4 mg Oral Q6H  PRN Cherene Altes, MD      . oxyCODONE (Oxy IR/ROXICODONE) immediate release tablet 5 mg  5 mg Oral Q4H PRN Cherene Altes, MD   5 mg at 06/17/20 2013  . pantoprazole (PROTONIX) EC tablet 40 mg  40 mg Oral BID Magrinat, Virgie Dad, MD   40 mg at 06/19/20 2153  . polyethylene glycol (MIRALAX / GLYCOLAX) packet 17 g  17 g Oral Daily Magrinat, Virgie Dad, MD      . prochlorperazine (COMPAZINE) tablet 5 mg  5 mg Oral TID AC & HS Magrinat, Virgie Dad, MD      . sodium chloride flush (NS) 0.9 % injection 10-40 mL  10-40 mL Intracatheter Q12H Cherene Altes, MD   10 mL at 06/18/20 2102  . sodium chloride flush (NS) 0.9 % injection 10-40 mL  10-40 mL Intracatheter PRN Joette Catching T, MD      . sodium chloride flush (NS) 0.9 % injection 5 mL  5 mL Intracatheter Q8H Aletta Edouard, MD   5 mL at 06/20/20 0620  . tamsulosin (FLOMAX) capsule 0.4 mg  0.4 mg Oral BID Cherene Altes, MD   0.4 mg at 06/19/20 2153  . vitamin B-12 (CYANOCOBALAMIN) tablet 1,000 mcg  1,000 mcg Oral BID  Joette Catching T, MD   1,000 mcg at 06/19/20 2153     Objective: Vital: Vitals:   06/19/20 1001 06/19/20 1158 06/19/20 2052 06/20/20 0606  BP: (!) 159/75 (!) 162/79 (!) 158/72 (!) 156/79  Pulse: 80 75 72 83  Resp:  19 18 18   Temp:  99.1 F (37.3 C) 98.3 F (36.8 C) 98.5 F (36.9 C)  TempSrc:  Oral Oral   SpO2:  100% 98% 98%  Weight:      Height:       I/Os: I/O last 3 completed shifts: In: 2794.2 [P.O.:960; I.V.:1814.2; Other:20] Out: 4800 [Urine:4800]  Physical Exam:  General: Patient is in no apparent distress Lungs: Normal respiratory effort, chest expands symmetrically. GI:  The abdomen is soft and nontender without mass. Left neph tube draining clear urine Ext: lower extremities symmetric  Lab Results: Recent Labs    06/18/20 0621 06/19/20 0347 06/20/20 0841  WBC 7.1 6.1 5.5  HGB 8.3* 7.9* 8.2*  HCT 26.4* 24.1* 25.3*   Recent Labs    06/18/20 0621 06/19/20 0347 06/20/20 0841   NA 133* 135 138  K 4.5 3.7 3.4*  CL 103 103 105  CO2 22 25 24   GLUCOSE 92 94 121*  BUN 41* 25* 15  CREATININE 5.31* 3.40* 1.93*  CALCIUM 7.8* 7.3* 7.7*   Recent Labs    06/17/20 1638  INR 1.1   No results for input(s): LABURIN in the last 72 hours. Results for orders placed or performed during the hospital encounter of 06/17/20  Respiratory Panel by RT PCR (Flu A&B, Covid) - Nasopharyngeal Swab     Status: None   Collection Time: 06/17/20  2:26 PM   Specimen: Nasopharyngeal Swab  Result Value Ref Range Status   SARS Coronavirus 2 by RT PCR NEGATIVE NEGATIVE Final    Comment: (NOTE) SARS-CoV-2 target nucleic acids are NOT DETECTED.  The SARS-CoV-2 RNA is generally detectable in upper respiratoy specimens during the acute phase of infection. The lowest concentration of SARS-CoV-2 viral copies this assay can detect is 131 copies/mL. A negative result does not preclude SARS-Cov-2 infection and should not be used as the sole basis for treatment or other patient management decisions. A negative result may occur with  improper specimen collection/handling, submission of specimen other than nasopharyngeal swab, presence of viral mutation(s) within the areas targeted by this assay, and inadequate number of viral copies (<131 copies/mL). A negative result must be combined with clinical observations, patient history, and epidemiological information. The expected result is Negative.  Fact Sheet for Patients:  PinkCheek.be  Fact Sheet for Healthcare Providers:  GravelBags.it  This test is no t yet approved or cleared by the [place] FDA and  has been authorized for detection and/or diagnosis of SARS-CoV-2 by FDA under an Emergency Use Authorization (EUA). This EUA will remain  in effect (meaning this test can be used) for the duration of the COVID-19 declaration under Section 564(b)(1) of the Act, 21 U.S.C. section  360bbb-3(b)(1), unless the authorization is terminated or revoked sooner.     Influenza A by PCR NEGATIVE NEGATIVE Final   Influenza B by PCR NEGATIVE NEGATIVE Final    Comment: (NOTE) The Xpert Xpress SARS-CoV-2/FLU/RSV assay is intended as an aid in  the diagnosis of influenza from Nasopharyngeal swab specimens and  should not be used as a sole basis for treatment. Nasal washings and  aspirates are unacceptable for Xpert Xpress SARS-CoV-2/FLU/RSV  testing.  Fact Sheet for Patients: PinkCheek.be  Fact Sheet for  Healthcare Providers: GravelBags.it  This test is not yet approved or cleared by the [place] and  has been authorized for detection and/or diagnosis of SARS-CoV-2 by  FDA under an Emergency Use Authorization (EUA). This EUA will remain  in effect (meaning this test can be used) for the duration of the  Covid-19 declaration under Section 564(b)(1) of the Act, 21  U.S.C. section 360bbb-3(b)(1), unless the authorization is  terminated or revoked. Performed at [facility], [place] [address], [place], [place] [address]     Studies/Results: DG CHEST PORT 1 VIEW  Result Date: 06/19/2020 CLINICAL DATA:  69 year old female with metastatic breast cancer. Hydronephrosis. Cough. EXAM: PORTABLE CHEST 1 VIEW COMPARISON:  Chest CT 09/18/2019 and earlier. FINDINGS: Portable AP semi upright view at 1800 hours. Stable right chest porta cath. Mildly lower lung volumes. Mediastinal contours remain normal. Allowing for portable technique the lungs are clear. No pneumothorax or pleural effusion. Stable surgical clips at the left axilla, left thoracic inlet. Prior cervical ACDF. Bulky deformity of the mid right clavicle is new since 2017. And there is around osteo lytic lesion of the right humeral head with adjacent minimally displaced chronic fracture. Negative visible bowel gas pattern. IMPRESSION: 1. No acute  cardiopulmonary abnormality. 2. Bone metastases and chronic fractures about the right shoulder. Electronically Signed   By: Genevie Ann M.D.   On: 06/19/2020 19:59    Assessment: Patient's renal function improving quite significantly.  Appears that hte right kidney was anuric from hypovolemia.  Left kidney now well drained.  Plan: From my prospective this patient is adequately drained at this point.  She may benefit from intermittent IVF while at home (1LNS daily).  I know her sister is an Therapist, sports and can help hang the fluid if the supplies are available.    I'll plan for her to return to my office in 6 weeks.  Louis Meckel, MD Urology 06/20/2020, 9:38 AM

## 2020-06-20 NOTE — TOC Initial Note (Signed)
Transition of Care [facility]) - Initial/Assessment Note    Patient Details  Name: [name] MRN: [id number] Date of Birth: 1951-04-10  Transition of Care [facility]) CM/SW Contact:    Dessa Phi, RN Phone Number: 06/20/2020, 4:00 PM  Clinical Narrative:  CM referral for d/c plans-spoke to patient about d/c plans-d/c home w/HHC-she will let me know tomorrow which [facility] agency she chooses for HHRN/Aide,CSW;Informed of cost for privat pay for nursing home care @ [facility]-she will continue to f/u on future plans.                 Expected Discharge Plan: [place] Barriers to Discharge: Continued Medical Work up   Patient Goals and CMS Choice Patient states their goals for this hospitalization and ongoing recovery are:: go home CMS Medicare.gov Compare Post Acute Care list provided to:: Patient Choice offered to / list presented to : Patient  Expected Discharge Plan and Services Expected Discharge Plan: [place]   Discharge Planning Services: CM Consult   Living arrangements for the past 2 months: Single Family Home                                      Prior Living Arrangements/Services Living arrangements for the past 2 months: Single Family Home Lives with:: Siblings Patient language and need for interpreter reviewed:: Yes Do you feel safe going back to the place where you live?: Yes      Need for Family Participation in Patient Care: No (Comment) Care giver support system in place?: Yes (comment)   Criminal Activity/Legal Involvement Pertinent to Current Situation/Hospitalization: No - Comment as needed  Activities of Daily Living Home Assistive Devices/Equipment: Eyeglasses ADL Screening (condition at time of admission) Patient's cognitive ability adequate to safely complete daily activities?: Yes Is the patient deaf or have difficulty hearing?: Yes (slight hearing loss) Does the patient have difficulty seeing, even when  wearing glasses/contacts?: No Does the patient have difficulty concentrating, remembering, or making decisions?: Yes (some trouble now, but normally ok) Patient able to express need for assistance with ADLs?: Yes Does the patient have difficulty dressing or bathing?: No Independently performs ADLs?: Yes (appropriate for developmental age) Does the patient have difficulty walking or climbing stairs?: No Weakness of Legs: Both Weakness of Arms/Hands: Both  Permission Sought/Granted Permission sought to share information with : Case Manager Permission granted to share information with : Yes, Verbal Permission Granted  Share Information with NAME: Case mahager           Emotional Assessment Appearance:: Appears stated age Attitude/Demeanor/Rapport: Gracious Affect (typically observed): Accepting Orientation: : Oriented to Self, Oriented to Place, Oriented to  Time, Oriented to Situation Alcohol / Substance Use: Not Applicable Psych Involvement: No (comment)  Admission diagnosis:  Acute renal failure (ARF) (HCC) [N17.9] Hydronephrosis, left [N13.30] Hydronephrosis, unspecified hydronephrosis type [N13.30] Acute renal failure, unspecified acute renal failure type [facility]) [N17.9] Patient Active Problem List   Diagnosis Date Noted  . Malnutrition of moderate degree 06/20/2020  . Malignant neoplasm of overlapping sites of left breast in female, estrogen receptor positive ([place]) 06/09/2020  . Hydronephrosis 06/04/2020  . AKI (acute kidney injury) ([place]) 06/03/2020  . Bilateral hydronephrosis 06/03/2020  . Acute bilateral obstructive uropathy 06/03/2020  . Acute renal failure (ARF) ([place]) 06/03/2020  . Anal cancer ([place]) 09/01/2016  . Coronary artery calcification seen on CT scan 03/22/2016  .  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 Bone metastasis ([place]) 01/17/2016  . 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 BRCA2 positive 12/16/2015  . Port catheter in place 04/14/2013  . Adenocarcinoma  of left breast 01/27/2011  . Osteoporosis 01/27/2011   PCP:  Celene Squibb, MD Pharmacy:   [facility], [place] [address] AT [address] [place] [address] Phone: [phone] Fax: [phone]     Social Determinants of Health (SDOH) Interventions    Readmission Risk Interventions No flowsheet data found.

## 2020-06-20 NOTE — Progress Notes (Signed)
Long discussion with A[place]leigh today. Discussed her situation in general, treatment options, prognosis with and without further treatments or interventions. We came up with a possible plan, as follows:  1) control pain, nausea, and constipation: I have reviewed her orders so that she will be receiving today the same medications she would be receiving if discharged tomorrow; that will allow [place] to decide if she is a candidate for d/c to home or will need SNF; specifically she will need to be able to drink a quart of fluid today, otherwise she will not make it at home  2) if she goes home she will need assistance; she will check with her sister re employing someone known to the family who helped her parents previously. She will also need a HHN referral at discharge to manage the nephrostomy  3) if she needs SNF she thinks she has enough funds to pay for several months of an SNF of her choice; she is specifically interested in [facility] or [facility]; it will be helpful if the discharge coordinator can give the patient an idea of the cost at those two institutions specifically  4) I will set her up fr outpatient PET scan to assess her prognosis and right renal function; also will start her on low dose olaparib as outpatient  Will follow with you tomorrow with a view to possible discharge if we can operationalize the plan above

## 2020-06-20 NOTE — Progress Notes (Signed)
Referring Physician(s): Ch[place]ot  Supervising Physician: Sandi Mariscal  Patient Status:  [facility] - In-pt  Chief Complaint: Left flank pain   Subjective: Pt doing ok today; denies worsening flank pain,N/V   Allergies: Ciprofloxacin and Prochlorperazine  Medications: Prior to Admission medications   Medication Sig Start Date End Date Taking? Authorizing Provider  acetaminophen (TYLENOL) 500 MG tablet Take 1,000 mg by mouth every 6 (six) hours as needed for moderate pain or headache.   Yes [provider]  buPROPion (WELLBUTRIN SR) 100 MG 12 hr tablet Take 100 mg by mouth 2 (two) times daily.   Yes [provider]  CALCIUM-VITAMIN D PO Take 1 tablet by mouth 2 (two) times daily.    Yes [provider]  clonazePAM (KLONOPIN) 0.5 MG tablet Take 0.5 mg by mouth 2 (two) times daily as needed for anxiety.   Yes [provider]  denosumab (XGEVA) 120 MG/1.7ML SOLN injection Inject 120 mg into the skin every 3 (three) months.   Yes [provider]  HYDROcodone-acetaminophen (NORCO/VICODIN) 5-325 MG tablet Take 1-2 tablets by mouth every 6 (six) hours as needed for moderate pain or severe pain.   Yes [provider]  letrozole (FEMARA) 2.5 MG tablet Take 2.5 mg by mouth daily.   Yes [provider]  metoprolol succinate (TOPROL-XL) 25 MG 24 hr tablet Take 25 mg by mouth in the morning and at bedtime.  04/23/16  Yes [provider]  omeprazole (PRILOSEC) 40 MG capsule Take 1 capsule (40 mg total) by mouth 2 (two) times daily. Patient taking differently: Take 40 mg by mouth in the morning and at bedtime.  03/19/16  Yes Holley Bouche, NP  potassium chloride (KLOR-CON) 10 MEQ tablet Take 20 mEq by mouth 2 (two) times daily.   Yes [provider]  tamsulosin (FLOMAX) 0.4 MG CAPS capsule Take 1 capsule (0.4 mg total) by mouth 2 (two) times daily. 06/08/20 07/08/20 Yes Gherghe, Vella Redhead, MD  vitamin B-12  (CYANOCOBALAMIN) 1000 MCG tablet Take 1,000 mcg by mouth 2 (two) times daily.    Yes [provider]     Vital Signs: BP (!) 155/72   Pulse 80   Temp 98.5 F (36.9 C)   Resp 18   Ht 5\' 5"  (1.651 m)   Wt 123 lb 4.8 oz (55.9 kg)   SpO2 98%   BMI 20.52 kg/m   Physical Exam awake/alert; left PCN intact, insertion site ok, mildly tender, OP 2.2 liters yesterday, 200 cc today yellow urine  Imaging: DG Abdomen 1 View  Result Date: 06/17/2020 CLINICAL DATA:  Abdominal pain EXAM: ABDOMEN - 1 VIEW COMPARISON:  CT abdomen and pelvis June 06, 2020. FINDINGS: Double-J stents are present bilaterally, stable. Ostomy left mid abdomen, unchanged. No abnormal calcifications demonstrable. No bowel dilatation or air-fluid level to suggest bowel obstruction. No free air on supine examination. IMPRESSION: Stable appearing double-J stents, unchanged in positions. Ostomy left mid abdomen. No abnormal calcification. No bowel obstruction or free air evident. Electronically Signed   By: Lowella Grip III M.D.   On: 06/17/2020 13:42   US Renal  Result Date: 06/17/2020 CLINICAL DATA:  Progressive flank region pain EXAM: RENAL / URINARY TRACT ULTRASOUND COMPLETE COMPARISON:  Renal ultrasound June 05, 2020; CT abdomen and pelvis June 06, 2020 FINDINGS: Right Kidney: Renal measurements: 11.0 x 6.0 x 7.0 cm = volume: 267 mL. Echogenicity and renal cortical thickness are within normal limits. No mass or perinephric fluid visualized. Scarring lateral  right kidney noted. There is moderate hydronephrosis on the right. A stent is present extending from the right renal pelvis extending to the bladder. Left Kidney: Renal measurements: 11.5 x 6.5 x 5.5 cm = volume: 215 mL. Echogenicity and renal cortical thickness are within normal limits. No mass or perinephric fluid visualized. Moderate hydronephrosis. Double-J stent extending from left renal pelvis to the bladder level. Bladder: Double-J stents in bladder.   Bladder virtually empty. Other: None. IMPRESSION: Double-J stents present bilaterally. Despite presence of stents, moderate hydronephrosis is present on each side. Scarring lateral right kidney. Study otherwise unremarkable. Electronically Signed   By: Lowella Grip III M.D.   On: 06/17/2020 13:47   DG CHEST PORT 1 VIEW  Result Date: 06/19/2020 CLINICAL DATA:  69 year old female with metastatic breast cancer. Hydronephrosis. Cough. EXAM: PORTABLE CHEST 1 VIEW COMPARISON:  Chest CT 09/18/2019 and earlier. FINDINGS: Portable AP semi upright view at 1800 hours. Stable right chest porta cath. Mildly lower lung volumes. Mediastinal contours remain normal. Allowing for portable technique the lungs are clear. No pneumothorax or pleural effusion. Stable surgical clips at the left axilla, left thoracic inlet. Prior cervical ACDF. Bulky deformity of the mid right clavicle is new since 2017. And there is around osteo lytic lesion of the right humeral head with adjacent minimally displaced chronic fracture. Negative visible bowel gas pattern. IMPRESSION: 1. No acute cardiopulmonary abnormality. 2. Bone metastases and chronic fractures about the right shoulder. Electronically Signed   By: Genevie Ann M.D.   On: 06/19/2020 19:59   CT Renal Stone Study  Result Date: 06/17/2020 CLINICAL DATA:  Follow-up hydronephrosis. Worsening left flank pain. EXAM: CT ABDOMEN AND PELVIS WITHOUT CONTRAST TECHNIQUE: Multidetector CT imaging of the abdomen and pelvis was performed following the standard protocol without IV contrast. COMPARISON:  06/06/2020 FINDINGS: Lower chest: Resolution of left effusion. Abnormal interstitial pulmonary density persists. Hepatobiliary: Normal without contrast. Pancreas: Normal Spleen: Normal Adrenals/Urinary Tract: Adrenal glands are normal. Bilateral ureteral stents remain in place, with the ends in the renal collecting system and bladder. Considerable reduction in perinephric edema previously seen on  the left. Moderate fullness of the left renal collecting system does persist. Mild fullness of the right renal collecting system persists. Stomach/Bowel: Previous colostomy and distal colectomy. Soft tissue thickening in the presacral soft tissues appears unchanged. Vascular/Lymphatic: Aortic atherosclerosis. No aneurysm. IVC is normal. No retroperitoneal adenopathy is seen. Reproductive: Previous hysterectomy. Other: No free fluid or air. Musculoskeletal: Scattered osseous metastatic lesions are not significantly changed since the recent examinations. IMPRESSION: 1. Bilateral ureteral stents remain in place, with the ends in the renal collecting systems and bladder. Considerable reduction in perinephric edema previously seen on the left. Moderate fullness of the left renal collecting system does persist. Mild fullness of the right renal collecting system persists. 2. Previous colostomy and distal colectomy. Soft tissue thickening in the presacral soft tissues appears unchanged. Scattered osseous metastatic lesions are not significantly changed. 3. Resolution of left effusion. Abnormal pulmonary interstitial pulmonary density persists. 4. Aortic atherosclerosis. Aortic Atherosclerosis (ICD10-I70.0). Electronically Signed   By: Nelson Chimes M.D.   On: 06/17/2020 15:09   IR NEPHROSTOMY PLACEMENT LEFT  Result Date: 06/18/2020 CLINICAL DATA:  History of metastatic breast carcinoma, bilateral hydronephrosis, bilateral ureteral stent placement and worsening renal function with acute left flank pain. Due to worsening hydronephrosis and acute left flank pain, decision has been made to place a left percutaneous nephrostomy tube at this time. EXAM: 1. ULTRASOUND GUIDANCE FOR PUNCTURE OF THE LEFT RENAL  COLLECTING SYSTEM. 2. LEFT PERCUTANEOUS NEPHROSTOMY TUBE PLACEMENT. COMPARISON:  CT of the abdomen and pelvis as well as renal ultrasound earlier today. ANESTHESIA/SEDATION: 3.0 mg IV Versed; 100 mcg IV Fentanyl. Total  Moderate Sedation Time 18 minutes. Moderate (conscious) sedation was employed during this procedure. A total of Versed 3.0 mg and Fentanyl 100 mcg was administered intravenously. The patient's level of consciousness and vital signs were monitored continuously by radiology nursing throughout the procedure under my direct supervision. CONTRAST:  12 ml Omnipaque 300 MEDICATIONS: 2 g IV Ancef. Antibiotic was administered in an appropriate time frame prior to skin puncture. FLUOROSCOPY TIME:  1 minutes and 18 seconds.  8.0 mGy. PROCEDURE: The procedure, risks, benefits, and alternatives were explained to the patient. Questions regarding the procedure were encouraged and answered. The patient understands and consents to the procedure. A time-out was performed prior to initiating the procedure. Ultrasound was utilized to localize the left kidney. The left flank region was prepped with chlorhexidine in a sterile fashion, and a sterile drape was applied covering the operative field. A sterile gown and sterile gloves were used for the procedure. Local anesthesia was provided with 1% Lidocaine. Under direct ultrasound guidance, a 21 gauge needle was advanced into the renal collecting system. Ultrasound image documentation was performed. Aspiration of urine sample was performed followed by contrast injection. A transitional dilator was advanced over a guidewire. Percutaneous tract dilatation was then performed over the guidewire. A 10-French percutaneous nephrostomy tube was then advanced and formed in the collecting system. Catheter position was confirmed by fluoroscopy after contrast injection. The catheter was secured at the skin with a Prolene retention suture and Stat-Lock device. A gravity bag was placed. COMPLICATIONS: None. FINDINGS: Ultrasound demonstrates moderate hydronephrosis. Via interpolar caliceal access, a 10 French nephrostomy tube was advanced and formed in the renal pelvis. There is good return of urine from  the nephrostomy tube after placement. IMPRESSION: Placement of 10 French left percutaneous nephrostomy tube via interpolar caliceal access. The tube was formed in the renal pelvis and connected to gravity bag drainage. Electronically Signed   By: Aletta Edouard M.D.   On: 06/18/2020 08:37    Labs:  CBC: Recent Labs    06/17/20 1204 06/18/20 0621 06/19/20 0347 06/20/20 0841  WBC 11.3* 7.1 6.1 5.5  HGB 9.4* 8.3* 7.9* 8.2*  HCT 30.0* 26.4* 24.1* 25.3*  PLT 327 263 281 315    COAGS: Recent Labs    06/17/20 1638  INR 1.1    BMP: Recent Labs    06/17/20 1204 06/18/20 0621 06/19/20 0347 06/20/20 0841  NA 130* 133* 135 138  K 6.0* 4.5 3.7 3.4*  CL 101 103 103 105  CO2 20* 22 25 24   GLUCOSE 88 92 94 121*  BUN 46* 41* 25* 15  CALCIUM 8.4* 7.8* 7.3* 7.7*  CREATININE 6.87* 5.31* 3.40* 1.93*  GFRNONAA 6* 8* 14* 28*    LIVER FUNCTION TESTS: Recent Labs    06/17/20 1204 06/18/20 0621 06/19/20 0347 06/20/20 0841  BILITOT 0.5 0.5 0.2* 0.4  AST 13* 13* 14* 14*  ALT 10 8 7 7   ALKPHOS 63 54 55 57  PROT 7.0 5.8* 5.9* 6.3*  ALBUMIN 3.4* 2.8* 2.7* 2.9*    Assessment and Plan: Patient with history of metastatic breast carcinoma, bilateral hydronephrosis, bilateral ureteral stent placements, worsening renal function, acute left flank pain;status post left percutaneous nephrostomy on 11/8;afebrile, WBC normal, hgb 8.2(7.9), creat 1.93(3.4); further plans as per urology/oncology; will set pt up for left  PCN exchange as OP in 6-8 weeks   Electronically Signed: D. Rowe Robert, PA-C 06/20/2020, 10:45 AM   I spent a total of 15 minutes at the the patient's bedside AND on the patient's hospital floor or unit, greater than 50% of which was counseling/coordinating care for left nephrostomy    Patient ID: [name], female   DOB: 1951-07-02, 69 y.o.   MRN: [id number]

## 2020-06-20 NOTE — Care Management Important Message (Signed)
Important Message  Patient Details IM Letter given to the Patient Name: DEANZA UPPERMAN MRN: [id number] Date of Birth: 1951/08/04   Medicare Important Message Given:  Yes     Kerin Salen 06/20/2020, 10:33 AM

## 2020-06-20 NOTE — Evaluation (Signed)
Physical Therapy Evaluation Patient Details Name: [name] MRN: [id number] DOB: 07/06/1951 Today's Date: 06/20/2020   History of Present Illness  69 year old with a history of metastatic breast cancer currently on a chemo holiday, BRCA2 mutation, anxiety/depression, remote anal cancer, CKD stage IIIa, HTN, and recent admission for obstructive uropathy and acute kidney injury requiring bilateral ureteral stent placement who was discharged home 10/30.  Patient reported she was doing well until 4 days prior to this admission when she began to experience the recurrence of flank pain. Pt admitted for acute renal failure: Recurrent obstructive uropathy, Status post left percutaneous nephrostomy tube 11/8 in IR  Clinical Impression  Pt admitted with above diagnosis.  Pt currently with functional limitations due to the deficits listed below (see PT Problem List). Pt will benefit from skilled PT to increase their independence and safety with mobility to allow discharge to the venue listed below.  Pt ambulated in hallway with and without support of IV pole.  Pt encouraged to ambulate at least 3x/day.  Pt states her family is looking into providing as much 24/7 assist as possible.  Pt agrees to no f/u PT needs upon d/c at this time.     Follow Up Recommendations No PT follow up    Equipment Recommendations  None recommended by PT    Recommendations for Other Services       Precautions / Restrictions Precautions Precautions: Fall Precaution Comments: L PCN      Mobility  Bed Mobility Overal bed mobility: Modified Independent                  Transfers Overall transfer level: Needs assistance Equipment used: None Transfers: Sit to/from Stand Sit to Stand: Supervision            Ambulation/Gait Ambulation/Gait assistance: Min guard;Supervision Gait Distance (Feet): 300 Feet Assistive device: IV Pole;None Gait Pattern/deviations: Step-through pattern;Decreased stride  length     General Gait Details: pt started with IV pole for a little more support, however able to ambulate back to room without any support - does reports feeling a little unsteady however no overt LOB observed  Stairs            Wheelchair Mobility    Modified Rankin (Stroke Patients Only)       Balance                                             Pertinent Vitals/Pain Pain Assessment: 0-10 Pain Score: 4  Pain Location: drain site Pain Descriptors / Indicators: Discomfort Pain Intervention(s): Repositioned;Monitored during session    Home Living Family/patient expects to be discharged to:: Private residence Living Arrangements: Alone Available Help at Discharge: Family;Friend(s);Available 24 hours/day;Available PRN/intermittently Type of Home: House Home Access: Stairs to enter Entrance Stairs-Rails: Right Entrance Stairs-Number of Steps: 6 Home Layout: One level Home Equipment: Walker - 2 wheels      Prior Function Level of Independence: Independent               Hand Dominance        Extremity/Trunk Assessment        Lower Extremity Assessment Lower Extremity Assessment: Overall WFL for tasks assessed    Cervical / Trunk Assessment Cervical / Trunk Assessment: Normal  Communication   Communication: No difficulties  Cognition Arousal/Alertness: Awake/alert Behavior During Therapy: WFL for tasks assessed/performed Overall Cognitive  Status: Within Functional Limits for tasks assessed                                        General Comments      Exercises     Assessment/Plan    PT Assessment Patient needs continued PT services  PT Problem List Decreased mobility;Decreased activity tolerance;Decreased knowledge of use of DME       PT Treatment Interventions Therapeutic exercise;DME instruction;Stair training;Functional mobility training;Therapeutic activities;Patient/family education;Gait training     PT Goals (Current goals can be found in the Care Plan section)  Acute Rehab PT Goals PT Goal Formulation: With patient Time For Goal Achievement: 07/04/20 Potential to Achieve Goals: Good    Frequency Min 3X/week   Barriers to discharge        Co-evaluation               AM-PAC PT "6 Clicks" Mobility  Outcome Measure Help needed turning from your back to your side while in a flat bed without using bedrails?: None Help needed moving from lying on your back to sitting on the side of a flat bed without using bedrails?: None Help needed moving to and from a bed to a chair (including a wheelchair)?: None Help needed standing up from a chair using your arms (e.g., wheelchair or bedside chair)?: A Little Help needed to walk in hospital room?: A Little Help needed climbing 3-5 steps with a railing? : A Little 6 Click Score: 21    End of Session   Activity Tolerance: Patient tolerated treatment well Patient left: in bed;with call bell/phone within reach   PT Visit Diagnosis: Difficulty in walking, not elsewhere classified (R26.2)    Time: 1347-1400 PT Time Calculation (min) (ACUTE ONLY): 13 min   Charges:   PT Evaluation $PT Eval Low Complexity: 1 Low     Kati PT, DPT Acute Rehabilitation Services Pager: [phone] Office: [phone]  York Ram E 06/20/2020, 3:01 PM

## 2020-06-20 NOTE — Progress Notes (Signed)
PROGRESS NOTE  [name] HFW:[id number] DOB: [birth year] DOA: 06/17/2020 PCP: Celene Squibb, MD  HPI/Recap of past 57 hours: 69 year old with a history of metastatic breast cancer currently onachemo holiday, BRCA2 mutation,anxiety/depression, remote anal cancer, CKD stage IIIa, HTN,and recent admission for obstructive uropathy and acute kidney injury requiring bilateral ureteral stent placement who was discharged home 10/30. Patient reported she was doing well until 4 days prior to this admission when she began to experience the recurrence of flank pain. This pain only worsened despite use of narcotic pain medications and became associated with dry heaving and severe nausea. She also appreciated decreased urinary output. She spoke to her urologist office over the weekend and was told to present to the ED if she began to experience fevers. Today when her symptoms persisted/worsened she decided to come to the ED for evaluation.  In the ED she was found to be suffering with severe acute renal failure, hyperkalemia, and renal ultrasound noted moderate bilateral hydronephrosis despite presence of ureteral stents.  06/20/20: Reports poor oral intake and generalized weakness.  She lives alone and is concerned about not having any assistance at home.  Voiding on her own, measured at 300 cc at the time of this visit.  Assessment/Plan: Active Problems:   Acute renal failure (ARF) (HCC)   Malnutrition of moderate degree  Recurrent obstructive uropathy Status post left percutaneous nephrostomy tube 11/8 in IR  Draining well at present Seen by urology and IR, appreciate assistance  Improving acute kidney failure on CKD stage IIIa Creatinine 2.81 at time of discharge 10/30 Presented with cr 6.87and GFR 6 Creatinine down trending 1.93 from 3.40 with GFR of 28 on 06/20/2020 Continue to avoid nephrotoxins Continue IV fluid hydration  Resolved Hematuria, noted on 06/19/2020 Seen by  Urology Continue IV fluids  Resolved nonproductive cough, unclear etiology Chest x-ray, reviewed personally, no acute findings, lungs have no lobular infiltrates and no evidence of pulmonary edema or effusions.  Resolved severe hyperkalemia, now hypokalemia Serum potassium 3.4 on 06/20/2020. Repleted judiciously  Resolved Hyponatremia, with IV fluid. Serum sodium 138.  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 Palliative care follow-up at discharge   Code Status:  DNR stated by the patient herself. Family Communication:  Status is: Inpatient  Remains inpatient appropriate because:Inpatient level of care appropriate due to severity of illness   Dispo: The patient is from: Home  Anticipated d/c is to: Home  Anticipated d/c date is: 06/21/2020.  Patient currently is not medically stable to d/c, pending urology sign off.   Consultants:  Urology IR        Objective: Vitals:   06/19/20 2052 06/20/20 0606 06/20/20 1022 06/20/20 1229  BP: (!) 158/72 (!) 156/79 (!) 155/72 (!) 166/79  Pulse: 72 83 80 66  Resp: '18 18  16  ' Temp: 98.3 F (36.8 C) 98.5 F (36.9 C)  98.4 F (36.9 C)  TempSrc: Oral   Oral  SpO2: 98% 98%  100%  Weight:  Height:        Intake/Output Summary (Last 24 hours) at 06/20/2020 1449 Last data filed at 06/20/2020 1440 Gross per 24 hour  Intake 2075 ml   Output 2700 ml  Net -625 ml   Filed Weights   06/17/20 1005 06/17/20 1834  Weight: 59.8 kg 55.9 kg    Exam:  . General: 69 y.o. year-old female thin built, weak and chronically ill-appearing, in no acute stress.  Alert and oriented x3.  . Cardiovascular: Regular rate and rhythm no rubs or gallops.   Marland Kitchen Respiratory: Clear to auscultation no wheezes or rales.  . Abdomen: Bowel sounds present.  Ostomy bag in place.  Left nephrostomy bag in place also.   . Musculoskeletal: No lower extremity edema bilaterally.   Marland Kitchen Psychiatry: Mood is appropriate for condition and setting.   Data Reviewed: CBC: Recent Labs  Lab 06/17/20 1204 06/18/20 0621 06/19/20 0347 06/20/20 0841  WBC 11.3* 7.1 6.1 5.5  NEUTROABS 10.0*  --   --   --   HGB 9.4* 8.3* 7.9* 8.2*  HCT 30.0* 26.4* 24.1* 25.3*  MCV 106.0* 107.3* 103.9* 102.4*  PLT 327 263 281 110   Basic Metabolic Panel: Recent Labs  Lab 06/17/20 1204 06/18/20 0621 06/19/20 0347 06/20/20 0841  NA 130* 133* 135 138  K 6.0* 4.5 3.7 3.4*  CL 101 103 103 105  CO2 20* '22 25 24  ' GLUCOSE 88 92 94 121*  BUN 46* 41* 25* 15  CREATININE 6.87* 5.31* 3.40* 1.93*  CALCIUM 8.4* 7.8* 7.3* 7.7*   GFR: Estimated Creatinine Clearance: 25 mL/min (A) (by C-G formula based on SCr of 1.93 mg/dL (H)). Liver Function Tests: Recent Labs  Lab 06/17/20 1204 06/18/20 0621 06/19/20 0347 06/20/20 0841  AST 13* 13* 14* 14*  ALT '10 8 7 7  ' ALKPHOS 63 54 55 57  BILITOT 0.5 0.5 0.2* 0.4  PROT 7.0 5.8* 5.9* 6.3*  ALBUMIN 3.4* 2.8* 2.7* 2.9*   Recent Labs  Lab 06/17/20 1204  LIPASE 26   No results for input(s): AMMONIA in the last 168 hours. Coagulation Profile: Recent Labs  Lab 06/17/20 1638  INR 1.1   Cardiac Enzymes: No results for input(s): CKTOTAL, CKMB, CKMBINDEX, TROPONINI in the last 168 hours. BNP (last 3 results) No results for input(s): PROBNP in the last 8760 hours. HbA1C: No results for input(s): HGBA1C in the last 72 hours. CBG: No  results for input(s): GLUCAP in the last 168 hours. Lipid Profile: No results for input(s): CHOL, HDL, LDLCALC, TRIG, CHOLHDL, LDLDIRECT in the last 72 hours. Thyroid Function Tests: No results for input(s): TSH, T4TOTAL, FREET4, T3FREE, THYROIDAB in the last 72 hours. Anemia Panel: No results for input(s): VITAMINB12, FOLATE, FERRITIN, TIBC, IRON, RETICCTPCT in the last 72 hours. Urine analysis:    Component Value Date/Time   COLORURINE STRAW (A) 06/03/2020 1613   APPEARANCEUR CLEAR 06/03/2020 1613   LABSPEC 1.008 06/03/2020 1613   PHURINE 6.0 06/03/2020 1613   GLUCOSEU NEGATIVE 06/03/2020 1613   HGBUR NEGATIVE 06/03/2020 1613   BILIRUBINUR NEGATIVE 06/03/2020 1613   KETONESUR NEGATIVE 06/03/2020 1613   PROTEINUR NEGATIVE 06/03/2020 1613   UROBILINOGEN 0.2 08/06/2008 1500   NITRITE NEGATIVE 06/03/2020 1613   LEUKOCYTESUR NEGATIVE 06/03/2020 1613   Sepsis Labs: '@LABRCNTIP' (procalcitonin:4,lacticidven:4)  ) Recent Results (from the past 240 hour(s))  Respiratory Panel by RT PCR (Flu A&B, Covid) - Nasopharyngeal Swab     Status: None   Collection Time: 06/17/20  2:26 PM   Specimen: Nasopharyngeal Swab  Result Value Ref Range Status   SARS Coronavirus 2 by RT PCR NEGATIVE NEGATIVE Final    Comment: (NOTE) SARS-CoV-2 target nucleic acids are NOT DETECTED.  The SARS-CoV-2 RNA is generally detectable in upper respiratoy specimens during the acute phase of infection. The lowest concentration of SARS-CoV-2 viral copies this assay can detect is 131 copies/mL. A negative result does not preclude SARS-Cov-2 infection and should not be used as the sole basis for treatment or other patient management decisions. A negative result may occur with  improper specimen collection/handling, submission of specimen other than nasopharyngeal swab, presence of viral mutation(s) within the areas targeted by this assay, and inadequate number of viral copies (<131 copies/mL). A negative result must  be combined with clinical observations, patient history, and epidemiological information. The expected result is Negative.  Fact Sheet for Patients:  PinkCheek.be  Fact Sheet for Healthcare Providers:  GravelBags.it  This test is no t yet approved or cleared by the [place] FDA and  has been authorized for detection and/or diagnosis of SARS-CoV-2 by FDA under an Emergency Use Authorization (EUA). This EUA will remain  in effect (meaning this test can be used) for the duration of the COVID-19 declaration under Section 564(b)(1) of the Act, 21 U.S.C. section 360bbb-3(b)(1), unless the authorization is terminated or revoked sooner.     Influenza A by PCR NEGATIVE NEGATIVE Final   Influenza B by PCR NEGATIVE NEGATIVE Final    Comment: (NOTE) The Xpert Xpress SARS-CoV-2/FLU/RSV assay is intended as an aid in  the diagnosis of influenza from Nasopharyngeal swab specimens and  should not be used as a sole basis for treatment. Nasal washings and  aspirates are unacceptable for Xpert Xpress SARS-CoV-2/FLU/RSV  testing.  Fact Sheet for Patients: PinkCheek.be  Fact Sheet for Healthcare Providers: GravelBags.it  This test is not yet approved or cleared by the [place] FDA and  has been authorized for detection and/or diagnosis of SARS-CoV-2 by  FDA under an Emergency Use Authorization (EUA). This EUA will remain  in effect (meaning this test can be used) for the duration of the  Covid-19 declaration under Section 564(b)(1) of the Act, 21  U.S.C. section 360bbb-3(b)(1), unless the authorization is  terminated or revoked. Performed at [facility], [place] [address], [place], [place] [address]       Studies: DG CHEST PORT 1 VIEW  Result Date: 06/19/2020 CLINICAL DATA:  69 year old female with metastatic breast cancer. Hydronephrosis. Cough.  EXAM: PORTABLE CHEST 1 VIEW COMPARISON:  Chest CT 09/18/2019 and earlier. FINDINGS: Portable AP semi upright view at 1800 hours. Stable right chest porta cath. Mildly lower lung volumes. Mediastinal contours remain normal. Allowing for portable technique the lungs are clear. No pneumothorax or pleural effusion. Stable surgical clips at the left axilla, left thoracic inlet. Prior cervical ACDF. Bulky deformity of the mid right clavicle is new since 2017. And there is around osteo lytic lesion of the right humeral head with adjacent minimally displaced chronic fracture. Negative visible bowel gas pattern. IMPRESSION: 1. No acute cardiopulmonary abnormality. 2. Bone metastases and chronic fractures about the right shoulder. Electronically Signed   By: Genevie Ann M.D.   On: 06/19/2020 19:59    Scheduled Meds: . buPROPion  100 mg Oral BID  . Chlorhexidine Gluconate Cloth  6 each Topical Daily  . docusate sodium  100 mg Oral Daily  . feeding supplement  1 Container Oral Q24H  . feeding supplement  237 mL Oral Q24H  . heparin  injection (subcutaneous)  5,000 Units Subcutaneous Q8H  . lactose free nutrition  237 mL Oral Q24H  . metoprolol succinate  25 mg Oral Daily  . multivitamin with minerals  1 tablet Oral Daily  . pantoprazole  40 mg Oral BID  . polyethylene glycol  17 g Oral Daily  . prochlorperazine  5 mg Oral TID AC & HS  . sodium chloride flush  10-40 mL Intracatheter Q12H  . sodium chloride flush  5 mL Intracatheter Q8H  . tamsulosin  0.4 mg Oral BID  . vitamin B-12  1,000 mcg Oral BID    Continuous Infusions: . sodium chloride 50 mL/hr at 06/20/20 0027     LOS: 3 days     Kayleen Memos, MD [facility] Pager [phone]  If 7PM-7AM, please contact night-coverage [email or web address] Password [facility] 06/20/2020, 2:49 PM

## 2020-06-21 ENCOUNTER — Inpatient Hospital Stay: Payer: Medicare Other

## 2020-06-21 ENCOUNTER — Inpatient Hospital Stay: Payer: Medicare Other | Attending: Oncology | Admitting: Oncology

## 2020-06-21 LAB — RENAL FUNCTION PANEL
Albumin: 2.8 g/dL — ABNORMAL LOW (ref 3.5–5.0)
Anion gap: 11 (ref 5–15)
BUN: 13 mg/dL (ref 8–23)
CO2: 21 mmol/L — ABNORMAL LOW (ref 22–32)
Calcium: 7.9 mg/dL — ABNORMAL LOW (ref 8.9–10.3)
Chloride: 108 mmol/L (ref 98–111)
Creatinine, Ser: 1.73 mg/dL — ABNORMAL HIGH (ref 0.44–1.00)
GFR, Estimated: 32 mL/min — ABNORMAL LOW (ref >60)
Glucose, Bld: 90 mg/dL (ref 70–99)
Phosphorus: 2.7 mg/dL (ref 2.5–4.6)
Potassium: 4 mmol/L (ref 3.5–5.1)
Sodium: 140 mmol/L (ref 135–145)

## 2020-06-21 LAB — CALCIUM / CREATININE RATIO, URINE
Calcium, Ur: 3.1 mg/dL
Calcium/Creat.Ratio: 65 mg/g creat (ref 29–442)
Creatinine, Urine: 47.7 mg/dL

## 2020-06-21 MED ORDER — BOOST PLUS PO LIQD: 237.0000 mL | ORAL | 0 refills | Status: AC

## 2020-06-21 MED ORDER — POLYETHYLENE GLYCOL 3350 17 G PO PACK: 17.0000 g | PACK | Freq: Every day | ORAL | 0 refills | Status: AC

## 2020-06-21 MED ORDER — SODIUM CHLORIDE 0.9 % IV SOLN
1000.0000 mL | Freq: Every day | INTRAVENOUS | 0 refills | Status: DC
Start: 2020-06-21 — End: 2020-06-22

## 2020-06-21 MED ORDER — ENSURE ENLIVE PO LIQD: 237.0000 mL | ORAL | 0 refills | Status: AC

## 2020-06-21 MED ORDER — SODIUM CHLORIDE 0.9 % IV SOLN
1000.0000 mL | Freq: Every day | INTRAVENOUS | 0 refills | Status: DC
Start: 2020-06-21 — End: 2020-06-21

## 2020-06-21 MED ORDER — DOCUSATE SODIUM 100 MG PO CAPS: 100.0000 mg | ORAL_CAPSULE | Freq: Every day | ORAL | 0 refills | Status: AC

## 2020-06-21 MED ORDER — ADULT MULTIVITAMIN W/MINERALS CH: 1.0000 | ORAL_TABLET | Freq: Every day | ORAL | 0 refills | Status: AC

## 2020-06-21 MED ORDER — PROCHLORPERAZINE MALEATE 5 MG PO TABS: 5.0000 mg | ORAL_TABLET | Freq: Three times a day (TID) | ORAL | 0 refills | Status: DC

## 2020-06-21 NOTE — TOC Progression Note (Addendum)
Transition of Care [facility]) - Progression Note    Patient Details  Name: JO-ANNE KLUTH MRN: [id number] Date of Birth: 1951-04-16  Transition of Care [facility] CM/SW Contact  Madelaine Whipple, Juliann Pulse, RN Phone Number: 06/21/2020, 12:45 PM  Clinical Narrative: [facility] following for HHRN-ivf-port a cath-HH aide/csw also. Pam-home infusion w/[facility] following for instruction on ivf-script already printed.   4:10- [facility] pharmacy-needs script to include-ivf-dose, frequency,route, duration-fax script to [phone].   Expected Discharge Plan: [place] Barriers to Discharge: Continued Medical Work up  Expected Discharge Plan and Services Expected Discharge Plan: [place]   Discharge Planning Services: CM Consult   Living arrangements for the past 2 months: Single Family Home                           HH Arranged: RN, Nurse's Aide, Social Work [employer] Agency: [place] ([place]) Date [place]: 06/21/20 Time [place]: 1245 Representative spoke with at [place]: [place] ([place]) Interventions    Readmission Risk Interventions No flowsheet data found.

## 2020-06-21 NOTE — Progress Notes (Signed)
[name]'s nausea is much better controlled on compazine 5 mg po ACHS. She was able to drink about 1/2 quart yesterday and ate some. Will need IVF and HHN is being set up--1 L NS/ day should do it  She discovered waiting list at [facility] is about 6 months, quite aside from cost. Plans to stay home with some privately paid help plus her sister's support.  From a metastatic cancer point of view we have stopped femara, will obtain a PET as outpatient and will stat talazoparib next week (po). I will see her in about 2 weeks to reassess.  Anticipate d/c today if arrangements can be finalized

## 2020-06-21 NOTE — Progress Notes (Signed)
PROGRESS NOTE  [name] XTG:[id number] DOB: 1951-01-30 DOA: 06/17/2020 PCP: Celene Squibb, MD  HPI/Recap of past 29 hours: 69 year old with a history of metastatic breast cancer currently onachemo holiday, BRCA2 mutation,anxiety/depression, remote anal cancer, CKD stage IIIa, HTN,and recent admission for obstructive uropathy and acute kidney injury requiring bilateral ureteral stent placement who was discharged home 10/30. Patient reported she was doing well until 4 days prior to this admission when she began to experience the recurrence of flank pain. This pain only worsened despite use of narcotic pain medications and became associated with dry heaving and severe nausea. She also appreciated decreased urinary output. She spoke to her urologist office over the weekend and was told to present to the ED if she began to experience fevers. Today when her symptoms persisted/worsened she decided to come to the ED for evaluation.  In the ED she was found to be suffering with severe acute renal failure, hyperkalemia, and renal ultrasound noted moderate bilateral hydronephrosis despite presence of ureteral stents.  06/21/20: She is making efforts to eat however still has poor oral intake.  Agree with oncology and urology regarding continuing IV fluid NS at home 1 L/day.  Appreciate TOC assistance with arranging home health services.    Assessment/Plan: Active Problems:   Acute renal failure (ARF) (HCC)   Malnutrition of moderate degree  Recurrent obstructive uropathy Status post left percutaneous nephrostomy tube 06/17/20 by Dr. Kathlene Cote, IR Draining well  Good urine output Seen by urology and IR, appreciate assistance Follow-up with neurology and interventional radiology outpatient  Resolving acute kidney failure on CKD stage IIIa Creatinine 2.81 at time of discharge 10/30 Presented with cr 6.87and GFR 6 Creatinine down trending 1.73 from 1.93 from 3.40 GFR 32 on  06/21/2020. Good urine output, 2.2 L recorded in the last 24H. Continue to avoid nephrotoxins Continue IV fluid even after discharge 1 L normal saline per day.  Resolved Hematuria, noted on 06/19/2020 Seen by Urology Continue IV fluids  Resolved nonproductive cough, unclear etiology Chest x-ray, reviewed personally, no acute findings, lungs have no lobular infiltrates and no evidence of pulmonary edema or effusions.  Resolved severe hyperkalemia, now hypokalemia Serum potassium 3.4 on 06/20/2020. Repleted judiciously  Resolved Hyponatremia, with IV fluid. Serum sodium 138.  Metastatic breast cancer to bone Continue home regimen Seen by Dr. 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  HTN Continue home regimen Toprol-XL 25 mg twice daily Continue tamsulosin Continue to monitor vital signs  Chronic macrocytic anemia Monitor H&H  Anxiety/depression Stable Continue home regimen  Generalized weakness/poor oral intake Encourage increase in oral protein calorie intake Continue oral supplements  Goals of care DNR stated by the patient herself. Palliative care follow-up at discharge   Code Status:  DNR stated by the patient herself. Family Communication:  Status is: Inpatient  Remains inpatient appropriate because:Inpatient level of care appropriate due to severity of illness   Dispo: The patient is from: Home  Anticipated d/c is to: Home with home health services RN, aide and CSW.  Anticipated d/c date is: 06/22/2020.   Consultants:  Urology IR Oncology        Objective: Vitals:   06/20/20 2024 06/20/20 2110 06/21/20 0605 06/21/20 1351  BP:  (!) 149/73 (!) 154/72 (!) 152/73  Pulse: 79 80 75 75  Resp:  18 18   Temp:  98.8 F (  37.1 C) 98 F (36.7  C) 99.1 F (37.3 C)  TempSrc:  Oral Oral Oral  SpO2:  99% 98% 100%  Weight:      Height:        Intake/Output Summary (Last 24 hours) at 06/21/2020 1418 Last data filed at 06/21/2020 1253 Gross per 24 hour  Intake 2090 ml  Output 2476 ml  Net -386 ml   Filed Weights   06/17/20 1005 06/17/20 1834  Weight: 59.8 kg 55.9 kg    Exam:  . General: 69 y.o. year-old female thin built in no acute distress, weak appearing.  Alert and oriented x3. . Cardiovascular: Regular rate and rhythm no rubs or gallops. Marland Kitchen Respiratory: Clear to auscultation no wheezes or rales. . Abdomen: Bowel sounds present.  Ostomy bag in place.  Left nephrostomy bag in place also.   . Musculoskeletal: No lower extremity edema bilaterally. Marland Kitchen Psychiatry: Mood is appropriate for condition and setting.   Data Reviewed: CBC: Recent Labs  Lab 06/17/20 1204 06/18/20 0621 06/19/20 0347 06/20/20 0841  WBC 11.3* 7.1 6.1 5.5  NEUTROABS 10.0*  --   --   --   HGB 9.4* 8.3* 7.9* 8.2*  HCT 30.0* 26.4* 24.1* 25.3*  MCV 106.0* 107.3* 103.9* 102.4*  PLT 327 263 281 242   Basic Metabolic Panel: Recent Labs  Lab 06/17/20 1204 06/18/20 0621 06/19/20 0347 06/20/20 0841 06/21/20 0340  NA 130* 133* 135 138 140  K 6.0* 4.5 3.7 3.4* 4.0  CL 101 103 103 105 108  CO2 20* _0 21*  GLUCOSE 88 92 94 121* 90  BUN 46* 41* 25* 15 13  CREATININE 6.87* 5.31* 3.40* 1.93* 1.73*  CALCIUM 8.4* 7.8* 7.3* 7.7* 7.9*  PHOS  --   --   --   --  2.7   GFR: Estimated Creatinine Clearance: 27.8 mL/min (A) (by C-G formula based on SCr of 1.73 mg/dL (H)). Liver Function Tests: Recent Labs  Lab 06/17/20 1204 06/18/20 0621 06/19/20 0347 06/20/20 0841 06/21/20 0340  AST 13* 13* 14* 14*  --   ALT _1 --   ALKPHOS 63 54 55 57  --   BILITOT 0.5 0.5 0.2* 0.4  --   PROT 7.0 5.8* 5.9* 6.3*  --   ALBUMIN 3.4* 2.8* 2.7* 2.9* 2.8*   Recent Labs  Lab 06/17/20 1204  LIPASE 26   No results for input(s): AMMONIA in the last  168 hours. Coagulation Profile: Recent Labs  Lab 06/17/20 1638  INR 1.1   Cardiac Enzymes: No results for input(s): CKTOTAL, CKMB, CKMBINDEX, TROPONINI in the last 168 hours. BNP (last 3 results) No results for input(s): PROBNP in the last 8760 hours. HbA1C: No results for input(s): HGBA1C in the last 72 hours. CBG: No results for input(s): GLUCAP in the last 168 hours. Lipid Profile: No results for input(s): CHOL, HDL, LDLCALC, TRIG, CHOLHDL, LDLDIRECT in the last 72 hours. Thyroid Function Tests: No results for input(s): TSH, T4TOTAL, FREET4, T3FREE, THYROIDAB in the last 72 hours. Anemia Panel: No results for input(s): VITAMINB12, FOLATE, FERRITIN, TIBC, IRON, RETICCTPCT in the last 72 hours. Urine analysis:    Component Value Date/Time   COLORURINE STRAW (A) 06/03/2020 1613   APPEARANCEUR CLEAR 06/03/2020 1613   LABSPEC 1.008 06/03/2020 1613   PHURINE 6.0 06/03/2020 1613   GLUCOSEU NEGATIVE 06/03/2020 1613   HGBUR NEGATIVE 06/03/2020 1613   BILIRUBINUR NEGATIVE 06/03/2020 1613   KETONESUR NEGATIVE 06/03/2020 1613   PROTEINUR NEGATIVE 06/03/2020  1613   UROBILINOGEN 0.2 08/06/2008 1500   NITRITE NEGATIVE 06/03/2020 1613   LEUKOCYTESUR NEGATIVE 06/03/2020 1613   Sepsis Labs: _0 (procalcitonin:4,lacticidven:4)  ) Recent Results (from the past 240 hour(s))  Respiratory Panel by RT PCR (Flu A&B, Covid) - Nasopharyngeal Swab     Status: None   Collection Time: 06/17/20  2:26 PM   Specimen: Nasopharyngeal Swab  Result Value Ref Range Status   SARS Coronavirus 2 by RT PCR NEGATIVE NEGATIVE Final    Comment: (NOTE) SARS-CoV-2 target nucleic acids are NOT DETECTED.  The SARS-CoV-2 RNA is generally detectable in upper respiratoy specimens during the acute phase of infection. The lowest concentration of SARS-CoV-2 viral copies this assay can detect is 131 copies/mL. A negative result does not preclude SARS-Cov-2 infection and should not be used as the sole basis  for treatment or other patient management decisions. A negative result may occur with  improper specimen collection/handling, submission of specimen other than nasopharyngeal swab, presence of viral mutation(s) within the areas targeted by this assay, and inadequate number of viral copies (<131 copies/mL). A negative result must be combined with clinical observations, patient history, and epidemiological information. The expected result is Negative.  Fact Sheet for Patients:  PinkCheek.be  Fact Sheet for Healthcare Providers:  GravelBags.it  This test is no t yet approved or cleared by the [place] FDA and  has been authorized for detection and/or diagnosis of SARS-CoV-2 by FDA under an Emergency Use Authorization (EUA). This EUA will remain  in effect (meaning this test can be used) for the duration of the COVID-19 declaration under Section 564(b)(1) of the Act, 21 U.S.C. section 360bbb-3(b)(1), unless the authorization is terminated or revoked sooner.     Influenza A by PCR NEGATIVE NEGATIVE Final   Influenza B by PCR NEGATIVE NEGATIVE Final    Comment: (NOTE) The Xpert Xpress SARS-CoV-2/FLU/RSV assay is intended as an aid in  the diagnosis of influenza from Nasopharyngeal swab specimens and  should not be used as a sole basis for treatment. Nasal washings and  aspirates are unacceptable for Xpert Xpress SARS-CoV-2/FLU/RSV  testing.  Fact Sheet for Patients: PinkCheek.be  Fact Sheet for Healthcare Providers: GravelBags.it  This test is not yet approved or cleared by the [place] FDA and  has been authorized for detection and/or diagnosis of SARS-CoV-2 by  FDA under an Emergency Use Authorization (EUA). This EUA will remain  in effect (meaning this test can be used) for the duration of the  Covid-19 declaration under Section 564(b)(1) of the Act, 21   U.S.C. section 360bbb-3(b)(1), unless the authorization is  terminated or revoked. Performed at [facility], [place] [address], [place], [place] [address]       Studies: No results found.  Scheduled Meds: . buPROPion  100 mg Oral BID  . Chlorhexidine Gluconate Cloth  6 each Topical Daily  . docusate sodium  100 mg Oral Daily  . feeding supplement  1 Container Oral Q24H  . feeding supplement  237 mL Oral Q24H  . heparin injection (subcutaneous)  5,000 Units Subcutaneous Q8H  . lactose free nutrition  237 mL Oral Q24H  . metoprolol succinate  25 mg Oral BID  . multivitamin with minerals  1 tablet Oral Daily  . pantoprazole  40 mg Oral BID  . polyethylene glycol  17 g Oral Daily  . prochlorperazine  5 mg Oral TID AC & HS  . sodium chloride flush  10-40 mL Intracatheter Q12H  . sodium chloride  flush  5 mL Intracatheter Q8H  . tamsulosin  0.4 mg Oral BID  . vitamin B-12  1,000 mcg Oral BID    Continuous Infusions: . sodium chloride 50 mL/hr at 06/20/20 2030     LOS: 4 days     Kayleen Memos, MD [facility] Pager [phone]  If 7PM-7AM, please contact night-coverage [email or web address] Password [email or web address] 06/21/2020, 2:18 PM

## 2020-06-21 NOTE — Progress Notes (Signed)
Pt ambulated the hallway from one end to the other with no discomfort times two.

## 2020-06-21 NOTE — Plan of Care (Signed)
Pt is alert and oriented times 4. C/o headache this shift received prn (see MARs). Pt ambulated in the hallway with no discomfort.  Problem: Education: Goal: Knowledge of General Education information will improve Description: Including pain rating scale, medication(s)/side effects and non-pharmacologic comfort measures Outcome: Progressing   Problem: Health Behavior/Discharge Planning: Goal: Ability to manage health-related needs will improve Outcome: Progressing   Problem: Clinical Measurements: Goal: Ability to maintain clinical measurements within normal limits will improve Outcome: Progressing Goal: Will remain free from infection Outcome: Progressing Goal: Diagnostic test results will improve Outcome: Progressing Goal: Respiratory complications will improve Outcome: Progressing Goal: Cardiovascular complication will be avoided Outcome: Progressing   Problem: Activity: Goal: Risk for activity intolerance will decrease Outcome: Progressing   Problem: Nutrition: Goal: Adequate nutrition will be maintained Outcome: Progressing   Problem: Coping: Goal: Level of anxiety will decrease Outcome: Progressing   Problem: Elimination: Goal: Will not experience complications related to bowel motility Outcome: Progressing Goal: Will not experience complications related to urinary retention Outcome: Progressing   Problem: Pain Managment: Goal: General experience of comfort will improve Outcome: Progressing   Problem: Safety: Goal: Ability to remain free from injury will improve Outcome: Progressing   Problem: Skin Integrity: Goal: Risk for impaired skin integrity will decrease Outcome: Progressing

## 2020-06-21 NOTE — Progress Notes (Signed)
Referring Physician(s): Dr. Vella Raring  Supervising Physician: Jacqulynn Cadet  Patient Status:  [facility] - In-pt  Chief Complaint:  Left sided flank pain found to have left sided hydronephrosis s.p left sided nephrostomy tube on 11.9.21 by Dr. Kathlene Cote  Subjective: Patient alert and laying in bed, calm and comfortable. Patient states that the left sided flank pain has resolved and she "does not feel like throwing herself in front of a moving car."  Patient reports mild pain at the nephrostomy tube exit site. Denies any fevers, nausea or vomiting.    Allergies: Ciprofloxacin and Prochlorperazine  Medications: Prior to Admission medications   Medication Sig Start Date End Date Taking? Authorizing Provider  acetaminophen (TYLENOL) 500 MG tablet Take 1,000 mg by mouth every 6 (six) hours as needed for moderate pain or headache.   Yes [provider]  buPROPion (WELLBUTRIN SR) 100 MG 12 hr tablet Take 100 mg by mouth 2 (two) times daily.   Yes [provider]  CALCIUM-VITAMIN D PO Take 1 tablet by mouth 2 (two) times daily.    Yes [provider]  clonazePAM (KLONOPIN) 0.5 MG tablet Take 0.5 mg by mouth 2 (two) times daily as needed for anxiety.   Yes [provider]  denosumab (XGEVA) 120 MG/1.7ML SOLN injection Inject 120 mg into the skin every 3 (three) months.   Yes [provider]  HYDROcodone-acetaminophen (NORCO/VICODIN) 5-325 MG tablet Take 1-2 tablets by mouth every 6 (six) hours as needed for moderate pain or severe pain.   Yes [provider]  letrozole (FEMARA) 2.5 MG tablet Take 2.5 mg by mouth daily.   Yes [provider]  metoprolol succinate (TOPROL-XL) 25 MG 24 hr tablet Take 25 mg by mouth in the morning and at bedtime.  04/23/16  Yes [provider]  omeprazole (PRILOSEC) 40 MG capsule Take 1 capsule (40 mg total) by mouth 2 (two) times daily. Patient taking differently: Take 40 mg by mouth in the  morning and at bedtime.  03/19/16  Yes Holley Bouche, NP  potassium chloride (KLOR-CON) 10 MEQ tablet Take 20 mEq by mouth 2 (two) times daily.   Yes [provider]  tamsulosin (FLOMAX) 0.4 MG CAPS capsule Take 1 capsule (0.4 mg total) by mouth 2 (two) times daily. 06/08/20 07/08/20 Yes Gherghe, Vella Redhead, MD  vitamin B-12 (CYANOCOBALAMIN) 1000 MCG tablet Take 1,000 mcg by mouth 2 (two) times daily.    Yes [provider]     Vital Signs: BP (!) 154/72 (BP Location: Right Arm)   Pulse 75   Temp 98 F (36.7 C) (Oral)   Resp 18   Ht 5\' 5"  (1.651 m)   Wt 123 lb 4.8 oz (55.9 kg)   SpO2 98%   BMI 20.52 kg/m   Physical Exam Vitals and nursing note reviewed.  Constitutional:      Appearance: She is well-developed.  HENT:     Head: Normocephalic and atraumatic.  Eyes:     Conjunctiva/sclera: Conjunctivae normal.  Pulmonary:     Effort: Pulmonary effort is normal.  Genitourinary:    Comments: Left nephrostomy tube togravity bag. Site is unremarkable with no erythema, edema, tenderness, bleeding or drainage noted at exit site. Suture and stat lock in place. Dressing is clean dry and intact. 200 ml of pale yellow colored fluid noted in gravity bag.   Musculoskeletal:     Cervical back: Normal range of motion.  Neurological:     Mental Status: She  is alert and oriented to person, place, and time.     Imaging: DG Abdomen 1 View  Result Date: 06/17/2020 CLINICAL DATA:  Abdominal pain EXAM: ABDOMEN - 1 VIEW COMPARISON:  CT abdomen and pelvis June 06, 2020. FINDINGS: Double-J stents are present bilaterally, stable. Ostomy left mid abdomen, unchanged. No abnormal calcifications demonstrable. No bowel dilatation or air-fluid level to suggest bowel obstruction. No free air on supine examination. IMPRESSION: Stable appearing double-J stents, unchanged in positions. Ostomy left mid abdomen. No abnormal calcification. No bowel obstruction or free air evident.  Electronically Signed   By: Lowella Grip III M.D.   On: 06/17/2020 13:42   US Renal  Result Date: 06/17/2020 CLINICAL DATA:  Progressive flank region pain EXAM: RENAL / URINARY TRACT ULTRASOUND COMPLETE COMPARISON:  Renal ultrasound June 05, 2020; CT abdomen and pelvis June 06, 2020 FINDINGS: Right Kidney: Renal measurements: 11.0 x 6.0 x 7.0 cm = volume: 267 mL. Echogenicity and renal cortical thickness are within normal limits. No mass or perinephric fluid visualized. Scarring lateral right kidney noted. There is moderate hydronephrosis on the right. A stent is present extending from the right renal pelvis extending to the bladder. Left Kidney: Renal measurements: 11.5 x 6.5 x 5.5 cm = volume: 215 mL. Echogenicity and renal cortical thickness are within normal limits. No mass or perinephric fluid visualized. Moderate hydronephrosis. Double-J stent extending from left renal pelvis to the bladder level. Bladder: Double-J stents in bladder.  Bladder virtually empty. Other: None. IMPRESSION: Double-J stents present bilaterally. Despite presence of stents, moderate hydronephrosis is present on each side. Scarring lateral right kidney. Study otherwise unremarkable. Electronically Signed   By: Lowella Grip III M.D.   On: 06/17/2020 13:47   DG CHEST PORT 1 VIEW  Result Date: 06/19/2020 CLINICAL DATA:  69 year old female with metastatic breast cancer. Hydronephrosis. Cough. EXAM: PORTABLE CHEST 1 VIEW COMPARISON:  Chest CT 09/18/2019 and earlier. FINDINGS: Portable AP semi upright view at 1800 hours. Stable right chest porta cath. Mildly lower lung volumes. Mediastinal contours remain normal. Allowing for portable technique the lungs are clear. No pneumothorax or pleural effusion. Stable surgical clips at the left axilla, left thoracic inlet. Prior cervical ACDF. Bulky deformity of the mid right clavicle is new since 2017. And there is around osteo lytic lesion of the right humeral head with  adjacent minimally displaced chronic fracture. Negative visible bowel gas pattern. IMPRESSION: 1. No acute cardiopulmonary abnormality. 2. Bone metastases and chronic fractures about the right shoulder. Electronically Signed   By: Genevie Ann M.D.   On: 06/19/2020 19:59   CT Renal Stone Study  Result Date: 06/17/2020 CLINICAL DATA:  Follow-up hydronephrosis. Worsening left flank pain. EXAM: CT ABDOMEN AND PELVIS WITHOUT CONTRAST TECHNIQUE: Multidetector CT imaging of the abdomen and pelvis was performed following the standard protocol without IV contrast. COMPARISON:  06/06/2020 FINDINGS: Lower chest: Resolution of left effusion. Abnormal interstitial pulmonary density persists. Hepatobiliary: Normal without contrast. Pancreas: Normal Spleen: Normal Adrenals/Urinary Tract: Adrenal glands are normal. Bilateral ureteral stents remain in place, with the ends in the renal collecting system and bladder. Considerable reduction in perinephric edema previously seen on the left. Moderate fullness of the left renal collecting system does persist. Mild fullness of the right renal collecting system persists. Stomach/Bowel: Previous colostomy and distal colectomy. Soft tissue thickening in the presacral soft tissues appears unchanged. Vascular/Lymphatic: Aortic atherosclerosis. No aneurysm. IVC is normal. No retroperitoneal adenopathy is seen. Reproductive: Previous hysterectomy. Other: No free fluid or air. Musculoskeletal: Scattered osseous  metastatic lesions are not significantly changed since the recent examinations. IMPRESSION: 1. Bilateral ureteral stents remain in place, with the ends in the renal collecting systems and bladder. Considerable reduction in perinephric edema previously seen on the left. Moderate fullness of the left renal collecting system does persist. Mild fullness of the right renal collecting system persists. 2. Previous colostomy and distal colectomy. Soft tissue thickening in the presacral soft tissues  appears unchanged. Scattered osseous metastatic lesions are not significantly changed. 3. Resolution of left effusion. Abnormal pulmonary interstitial pulmonary density persists. 4. Aortic atherosclerosis. Aortic Atherosclerosis (ICD10-I70.0). Electronically Signed   By: Nelson Chimes M.D.   On: 06/17/2020 15:09   IR NEPHROSTOMY PLACEMENT LEFT  Result Date: 06/18/2020 CLINICAL DATA:  History of metastatic breast carcinoma, bilateral hydronephrosis, bilateral ureteral stent placement and worsening renal function with acute left flank pain. Due to worsening hydronephrosis and acute left flank pain, decision has been made to place a left percutaneous nephrostomy tube at this time. EXAM: 1. ULTRASOUND GUIDANCE FOR PUNCTURE OF THE LEFT RENAL COLLECTING SYSTEM. 2. LEFT PERCUTANEOUS NEPHROSTOMY TUBE PLACEMENT. COMPARISON:  CT of the abdomen and pelvis as well as renal ultrasound earlier today. ANESTHESIA/SEDATION: 3.0 mg IV Versed; 100 mcg IV Fentanyl. Total Moderate Sedation Time 18 minutes. Moderate (conscious) sedation was employed during this procedure. A total of Versed 3.0 mg and Fentanyl 100 mcg was administered intravenously. The patient's level of consciousness and vital signs were monitored continuously by radiology nursing throughout the procedure under my direct supervision. CONTRAST:  12 ml Omnipaque 300 MEDICATIONS: 2 g IV Ancef. Antibiotic was administered in an appropriate time frame prior to skin puncture. FLUOROSCOPY TIME:  1 minutes and 18 seconds.  8.0 mGy. PROCEDURE: The procedure, risks, benefits, and alternatives were explained to the patient. Questions regarding the procedure were encouraged and answered. The patient understands and consents to the procedure. A time-out was performed prior to initiating the procedure. Ultrasound was utilized to localize the left kidney. The left flank region was prepped with chlorhexidine in a sterile fashion, and a sterile drape was applied covering the  operative field. A sterile gown and sterile gloves were used for the procedure. Local anesthesia was provided with 1% Lidocaine. Under direct ultrasound guidance, a 21 gauge needle was advanced into the renal collecting system. Ultrasound image documentation was performed. Aspiration of urine sample was performed followed by contrast injection. A transitional dilator was advanced over a guidewire. Percutaneous tract dilatation was then performed over the guidewire. A 10-French percutaneous nephrostomy tube was then advanced and formed in the collecting system. Catheter position was confirmed by fluoroscopy after contrast injection. The catheter was secured at the skin with a Prolene retention suture and Stat-Lock device. A gravity bag was placed. COMPLICATIONS: None. FINDINGS: Ultrasound demonstrates moderate hydronephrosis. Via interpolar caliceal access, a 10 French nephrostomy tube was advanced and formed in the renal pelvis. There is good return of urine from the nephrostomy tube after placement. IMPRESSION: Placement of 10 French left percutaneous nephrostomy tube via interpolar caliceal access. The tube was formed in the renal pelvis and connected to gravity bag drainage. Electronically Signed   By: Aletta Edouard M.D.   On: 06/18/2020 08:37    Labs:  CBC: Recent Labs    06/17/20 1204 06/18/20 0621 06/19/20 0347 06/20/20 0841  WBC 11.3* 7.1 6.1 5.5  HGB 9.4* 8.3* 7.9* 8.2*  HCT 30.0* 26.4* 24.1* 25.3*  PLT 327 263 281 315    COAGS: Recent Labs    06/17/20 1638  INR 1.1    BMP: Recent Labs    06/18/20 0621 06/19/20 0347 06/20/20 0841 06/21/20 0340  NA 133* 135 138 140  K 4.5 3.7 3.4* 4.0  CL 103 103 105 108  CO2 22 25 24  21*  GLUCOSE 92 94 121* 90  BUN 41* 25* 15 13  CALCIUM 7.8* 7.3* 7.7* 7.9*  CREATININE 5.31* 3.40* 1.93* 1.73*  GFRNONAA 8* 14* 28* 32*    LIVER FUNCTION TESTS: Recent Labs    06/17/20 1204 06/17/20 1204 06/18/20 0621 06/19/20 0347  06/20/20 0841 06/21/20 0340  BILITOT 0.5  --  0.5 0.2* 0.4  --   AST 13*  --  13* 14* 14*  --   ALT 10  --  8 7 7   --   ALKPHOS 63  --  54 55 57  --   PROT 7.0  --  5.8* 5.9* 6.3*  --   ALBUMIN 3.4*   < > 2.8* 2.7* 2.9* 2.8*   < > = values in this interval not displayed.    Assessment and Plan:  69 y.o. female in patient. Remote history anal cancer, metastatic breast cancer with  ureteral obstruction with hydronephrosis s/p bilateral ureteral stents on 10.25.21 and exchange of the left ureteral sent  on 10.29.21 due to concern for malposition. The Patient presented to the ED at [facility] on 11.8.21 with persistent and worsening left flank pain with nausea and a decrease in urination with fevers.  Patient was found to have  worsening renal function with CT renal showing the ureteral stents in good position with left sided moderate hydronephrosis. IR placed a left sided nephrostomy tube on 11.9.21   Per Epic output is 625 ml, 2000 ml 2175 ml. BUN 13, Cr 1.73, (down trending)  WBC is 5.5, AST 14.  Patient is afebrile. 200 ml of pale yellow output noted to be in the gravity bag at the time of evaluation.   Further plans per Urology. Orders have been placed for outpatient IR follow up in 6 -8 weeks for a left nephrostomy tube exchange. IR scheduling will contact patient directly with appointment specifics. Please call IR with questions or concerns.    Electronically Signed: Jacqualine Mau, NP 06/21/2020, 10:45 AM   I spent a total of 15 Minutes at the patient's bedside AND on the patient's hospital floor or unit, greater than 50% of which was counseling/coordinating care for left nephrostomy tube placement.

## 2020-06-21 NOTE — Discharge Instructions (Signed)

## 2020-06-22 LAB — RENAL FUNCTION PANEL
Albumin: 2.8 g/dL — ABNORMAL LOW (ref 3.5–5.0)
Anion gap: 10 (ref 5–15)
BUN: 14 mg/dL (ref 8–23)
CO2: 22 mmol/L (ref 22–32)
Calcium: 7.9 mg/dL — ABNORMAL LOW (ref 8.9–10.3)
Chloride: 107 mmol/L (ref 98–111)
Creatinine, Ser: 1.72 mg/dL — ABNORMAL HIGH (ref 0.44–1.00)
GFR, Estimated: 32 mL/min — ABNORMAL LOW (ref >60)
Glucose, Bld: 92 mg/dL (ref 70–99)
Phosphorus: 2.7 mg/dL (ref 2.5–4.6)
Potassium: 3.9 mmol/L (ref 3.5–5.1)
Sodium: 139 mmol/L (ref 135–145)

## 2020-06-22 MED ORDER — SODIUM CHLORIDE 0.9 % IV SOLN: 1000.0000 mL | Freq: Every day | INTRAVENOUS | 0 refills | Status: AC

## 2020-06-22 MED ORDER — HEPARIN SOD (PORK) LOCK FLUSH 100 UNIT/ML IV SOLN
500.0000 [IU] | INTRAVENOUS | Status: AC | PRN
  Administered 2020-06-22: 500 [IU]
  Filled 2020-06-22: qty 5

## 2020-06-22 NOTE — Plan of Care (Signed)

## 2020-06-22 NOTE — Progress Notes (Signed)
Just received confirmation to discharge pt from case manager at this time.

## 2020-06-22 NOTE — Discharge Summary (Addendum)
Discharge Summary  [name] EGB:[id number] DOB: 1950-12-04  PCP: Celene Squibb, MD  Admit date: 06/17/2020 Discharge date: 06/22/2020  Time spent: 35 minutes   Recommendations for Outpatient Follow-up:  1. Follow up with oncology in 2 weeks. 2. Follow up with IR in 1-2 weeks. 3. Follow up with urology in 6 weeks. 4. Follow up with your PCP in 1-2 weeks. 5. Follow up with Palliative Care medicine outpatient. 6. Take your medications as prescribed. 4.    Avoid dehydration and nephrotoxic medications.  Discharge Diagnoses:  Active Hospital Problems   Diagnosis Date Noted  . Malnutrition of moderate degree 06/20/2020  . Acute renal failure (ARF) ([facility]) 06/03/2020    Resolved Hospital Problems  No resolved problems to display.    Discharge Condition: Stable   Diet recommendation: Resume previous diet  Vitals:   06/22/20 0523 06/22/20 1259  BP: (!) 150/81 (!) 162/77  Pulse: 74 66  Resp: 18 18  Temp: 98.2 F (36.8 C) 98.5 F (36.9 C)  SpO2: 97% 100%    History of present illness:  69 year old with a history of metastatic breast cancer currently onachemo holiday, BRCA2 mutation,anxiety/depression, remote anal cancer, CKD stage IIIa, HTN,and recent admission for obstructive uropathy and acute kidney injury requiring bilateral ureteral stent placement who was discharged home 06/08/20. Patient reported she was doing well until 4 days prior to this admission when she began to experience recurrence of flank pain. This pain worsened despite use of narcotic pain medications and became associated with severe nausea and dry heaving. She also noted decrease in urinary output. She spoke to her urologist office, recommended to go to the ED if she began to experience fevers.  Due to worsening symptoms she decided to come to the ED for further evaluation and management.  In the ED she was found to have severe acute renal failure, hyperkalemia.  Renal ultrasound noted moderate  bilateral hydronephrosis despite presence of ureteral stents.  IR and urology were consulted.  She had a left percutaneous nephrostomy tube placed on 06/17/20 by Dr. Hollie Salk, IR.  Seen by Urology, Dr. Louis Meckel, recommended intermittent IVF while at home 1L NS daily.  Can follow up with urology in 6 weeks.  Seen by oncology Dr. Jana Hakim.  Stopped Femara, will obtain PET scan as outpatient and will start po Talazoparib next week.  Will follow up with oncology in 2 weeks.  06/22/20: No acute events overnight.  Vital signs and labs reviewed and are stable.  Hospital Course:  Active Problems:   Acute renal failure (ARF) ([facility])   Malnutrition of moderate degree  Recurrent obstructive uropathy Status post left percutaneous nephrostomy tube 06/17/20 by Dr. Kathlene Cote, IR Draining well  Good urine output, 2.1L UO recorded in the last 24hrs Seen by urology and IR, recommendations as stated above Follow-up with urology and interventional radiology outpatient  Resolving acute kidney failure on CKD stage IIIB Appears to be at her baseline creatinine 1.7 with GFR 32 on 06/22/20. Creatinine 2.81 at time of discharge on 06/08/20 Presented with cr 6.87and GFR 6 Good urine output, 2.1 L recorded in the last 24H. Continue to avoid nephrotoxins Continue IV fluid even after discharge 1 L normal saline per day intermittently as recommended by urology. Follow up with your PCP  Resolved Hematuria, noted on 06/19/2020 Seen by Urology Continue IV fluid while at home  Resolved nonproductive cough, unclear etiology Chest x-ray, reviewed personally, no acute findings, lungs have no lobular infiltrates and no evidence of pulmonary edema or  effusions. O2 sat 100% RA on 06/22/20.  Resolved severe hyperkalemia Presented with serum K+ 6.0, treated. K+ 3.9 on 06/22/20  Resolved Hyponatremia, with IV fluid. Presented with serum Na+ 130 Serum sodium 139 on 06/22/20.  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 Desma Mcgregor stopped per oncology              PET scan as outpatient and will start po Talazoparib next                 week.  Follow-up with oncology, Dr. Jana Hakim, outpatient in 2 weeks.  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  HTN Continue home regimen Toprol-XL 25 mg twice daily Continue tamsulosin Continue to monitor vital signs  Chronic macrocytic anemia Hg 8.2 from 7.9  Anxiety/depression Stable Continue home regimen  Generalized weakness/poor oral intake Encourage increase in oral protein calorie intake Continue oral supplements Continue IV fluid at home to avoid dehydration  Goals of care DNR stated by the patient herself. Palliative care follow-up at discharge   Code Status: DNR stated by the patient herself.  Consultants: Urology IR Oncology  Discharge Exam: BP (!) 162/77 (BP Location: Right Arm)   Pulse 66   Temp 98.5 F (36.9 C)   Resp 18   Ht '5\' 5"'  (1.651 m)   Wt 55.9 kg   SpO2 100%   BMI 20.52 kg/m  . General: 69 y.o. year-old female well developed well nourished in no acute distress.  Alert and oriented x3. . Cardiovascular: Regular rate and rhythm with no rubs or gallops.  No thyromegaly or JVD noted.   Marland Kitchen Respiratory: Clear to auscultation with no wheezes or rales. Good inspiratory effort. . Abdomen: Soft nontender nondistended with normal bowel sounds x4 quadrants.Ostomy and nephrostomy tube bags both in place. . Musculoskeletal: No lower extremity edema. 2/4 pulses in all 4 extremities. Marland Kitchen Psychiatry: Mood is appropriate for condition and setting  Discharge Instructions You were cared for by a hospitalist during your hospital stay. If you have any questions about your discharge medications or the care you received while you were in the hospital after you are discharged, you can call the unit and asked to  speak with the hospitalist on call if the hospitalist that took care of you is not available. Once you are discharged, your primary care physician will handle any further medical issues. Please note that NO REFILLS for any discharge medications will be authorized once you are discharged, as it is imperative that you return to your primary care physician (or establish a relationship with a primary care physician if you do not have one) for your aftercare needs so that they can reassess your need for medications and monitor your lab values.   Allergies as of 06/22/2020      Reactions   Ciprofloxacin Other (See Comments)   tendonitis   Prochlorperazine Anxiety   Other reaction(s): Confusion      Medication List    STOP taking these medications   clonazePAM 0.5 MG tablet Commonly known as: KLONOPIN   HYDROcodone-acetaminophen 5-325 MG tablet Commonly known as: NORCO/VICODIN   letrozole 2.5 MG tablet Commonly known as: FEMARA     TAKE these medications   acetaminophen 500 MG tablet Commonly known as: TYLENOL Take 1,000 mg by mouth every 6 (six) hours as needed for moderate pain or headache.   buPROPion 100 MG 12  hr tablet Commonly known as: WELLBUTRIN SR Take 100 mg by mouth 2 (two) times daily.   CALCIUM-VITAMIN D PO Take 1 tablet by mouth 2 (two) times daily.   docusate sodium 100 MG capsule Commonly known as: COLACE Take 1 capsule (100 mg total) by mouth daily.   feeding supplement Liqd Take 237 mLs by mouth daily for 7 days.   lactose free nutrition Liqd Take 237 mLs by mouth daily for 7 days.   metoprolol succinate 25 MG 24 hr tablet Commonly known as: TOPROL-XL Take 25 mg by mouth in the morning and at bedtime.   multivitamin with minerals Tabs tablet Take 1 tablet by mouth daily.   omeprazole 40 MG capsule Commonly known as: PRILOSEC Take 1 capsule (40 mg total) by mouth 2 (two) times daily. What changed: when to take this   polyethylene glycol 17 g  packet Commonly known as: MIRALAX / GLYCOLAX Take 17 g by mouth daily.   potassium chloride 10 MEQ tablet Commonly known as: KLOR-CON Take 20 mEq by mouth 2 (two) times daily.   prochlorperazine 5 MG tablet Commonly known as: COMPAZINE Take 1 tablet (5 mg total) by mouth 4 (four) times daily -  before meals and at bedtime.   sodium chloride 0.9 % infusion Inject 1,000 mLs into the vein daily.   tamsulosin 0.4 MG Caps capsule Commonly known as: FLOMAX Take 1 capsule (0.4 mg total) by mouth 2 (two) times daily.   vitamin B-12 1000 MCG tablet Commonly known as: CYANOCOBALAMIN Take 1,000 mcg by mouth 2 (two) times daily.   Xgeva 120 MG/1.7ML Soln injection Generic drug: denosumab Inject 120 mg into the skin every 3 (three) months.      Allergies  Allergen Reactions  . Ciprofloxacin Other (See Comments)    tendonitis  . Prochlorperazine Anxiety    Other reaction(s): Confusion    Follow-up Information    Celene Squibb, MD. Call in 1 day(s).   Specialty: Internal Medicine Why: Please call for a post hospital follow-up appointment. Contact information: [place] [address] [phone]        Magrinat, Virgie Dad, MD. Call in 1 day(s).   Specialty: Oncology Why: Please call for a post hospital follow-up appointment. Contact information: [place] [address] [phone]        Aletta Edouard, MD. Call in 1 day(s).   Specialties: Interventional Radiology, Radiology Why: Please call for a post hospital follow-up appointment. Contact information: [place] [place] [address] [address] [phone]        [name], MD. Call in 1 day(s).   Specialty: Urology Why: Please call for a post hospital follow-up appointment. Contact information: [place] [address] [phone]        [facility] .        [facility] Follow up.   [facility] [phone] Contact  information: [address] [place]               The results of significant diagnostics from this hospitalization (including imaging, microbiology, ancillary and laboratory) are listed below for reference.    Significant Diagnostic Studies: CT ABDOMEN PELVIS WO CONTRAST  Result Date: 06/06/2020 CLINICAL DATA:  Ureteral stent placement. Metastatic breast cancer. Left flank pain EXAM: CT ABDOMEN AND PELVIS WITHOUT CONTRAST TECHNIQUE: Multidetector CT imaging of the abdomen and pelvis was performed following the standard protocol without IV contrast. COMPARISON:  CT dated 06/03/2020 FINDINGS: Lower chest: There is a moderate to large partially visualized left-sided pleural effusion which has increased in size since the prior study.The heart size appears grossly normal. The previously demonstrated right-sided pleural effusion has improved. There are areas of atelectasis at the lung bases bilaterally with scattered interlobular septal thickening. Hepatobiliary: The liver is normal. There is layering hyperdense material in the gallbladder lumen, new since prior study. This may represent vicarious excretion of contrast versus less likely gallbladder sludge.There is no biliary ductal dilation. Pancreas: Normal contours without ductal dilatation. No peripancreatic fluid collection. Spleen: Unremarkable. Adrenals/Urinary Tract: --Adrenal glands: Unremarkable. --Right kidney/ureter: The patient has undergone right-sided double-J ureteral stent placement. The degree of hydronephrosis is largely unchanged from prior study. --Left kidney/ureter: The patient has undergone left-sided ureteral stent placement. The stent appears well positioned. The degree of hydronephrosis is largely unchanged despite stent placement. There is extensive increased perinephric fat stranding about the left kidney with new perinephric/retroperitoneal free fluid. --Urinary bladder: The bladder is decompressed by Foley  catheter. Stomach/Bowel: --Stomach/Duodenum: No hiatal hernia or other gastric abnormality. Normal duodenal course and caliber. --Small bowel: Unremarkable. --Colon: Patient is status post placement of an end colostomy in the left lower quadrant. There is no evidence for an obstruction. --Appendix: Normal. Vascular/Lymphatic: Atherosclerotic calcification is present within the non-aneurysmal abdominal aorta, without hemodynamically significant stenosis. --No retroperitoneal lymphadenopathy. --No mesenteric lymphadenopathy. --No pelvic or inguinal lymphadenopathy. Reproductive: Status post hysterectomy. No adnexal mass. Other: No ascites or free air. There is relatively unchanged presacral soft tissue. Musculoskeletal. Metastatic osseous lesions are noted in the lumbar spine. Osseous metastatic disease is noted to the ribs. IMPRESSION: 1. Largely unchanged bilateral hydroureteronephrosis despite well placed double-J ureteral stents. On the left, there is significant interval increase in perinephric fat stranding and retroperitoneal free fluid. Findings may be secondary to recent instrumentation versus a forniceal rupture. There is no well organized fluid collection to suggest a developing abscess, however evaluation is limited by lack of IV contrast. 2. Growing left-sided moderate to large pleural effusion. Significant interval decrease in the previously demonstrated right-sided pleural effusion. 3. Additional chronic stable findings are noted as detailed above. 4.  Aortic Atherosclerosis (ICD10-I70.0). Electronically Signed   By: Constance Holster M.D.   On: 06/06/2020 15:29   DG Abdomen 1 View  Result Date: 06/17/2020 CLINICAL DATA:  Abdominal pain EXAM: ABDOMEN - 1 VIEW COMPARISON:  CT abdomen and pelvis June 06, 2020. FINDINGS: Double-J stents are present bilaterally, stable. Ostomy left mid abdomen, unchanged. No abnormal calcifications demonstrable. No bowel dilatation or air-fluid level to suggest  bowel obstruction. No free air on supine examination. IMPRESSION: Stable appearing double-J stents, unchanged in positions. Ostomy left mid abdomen. No abnormal calcification. No bowel obstruction or free air evident. Electronically Signed   By: Lowella Grip III M.D.   On: 06/17/2020 13:42   US Renal  Result Date: 06/17/2020 CLINICAL DATA:  Progressive flank region pain EXAM: RENAL / URINARY TRACT ULTRASOUND COMPLETE COMPARISON:  Renal ultrasound June 05, 2020; CT abdomen and pelvis June 06, 2020 FINDINGS: Right Kidney: Renal measurements: 11.0 x 6.0 x 7.0 cm = volume: 267 mL. Echogenicity and renal cortical thickness are within normal limits. No mass or perinephric fluid visualized. Scarring lateral right kidney noted. There is moderate hydronephrosis on the right. A stent is present extending from the right renal pelvis extending to the bladder. Left Kidney: Renal measurements: 11.5 x 6.5 x 5.5 cm = volume: 215 mL. Echogenicity and renal cortical thickness are  within normal limits. No mass or perinephric fluid visualized. Moderate hydronephrosis. Double-J stent extending from left renal pelvis to the bladder level. Bladder: Double-J stents in bladder.  Bladder virtually empty. Other: None. IMPRESSION: Double-J stents present bilaterally. Despite presence of stents, moderate hydronephrosis is present on each side. Scarring lateral right kidney. Study otherwise unremarkable. Electronically Signed   By: Lowella Grip III M.D.   On: 06/17/2020 13:47   US RENAL  Result Date: 06/05/2020 CLINICAL DATA:  Left-sided flank pain status post stent placement EXAM: RENAL / URINARY TRACT ULTRASOUND COMPLETE COMPARISON:  CT 06/03/2020 FINDINGS: Right Kidney: Renal measurements: 10.2 x 4.9 x 6 cm = volume: 157 mL. Echogenicity within normal limits. Mild right hydronephrosis, likely decreased as compared with prior CT. No mass. Left Kidney: Renal measurements: 11.5 x 6.3 x 6.2 cm = volume: 235 mL.  Echogenicity within normal limits. No mass. Mild left hydronephrosis, grossly unchanged compared with CT. Bladder: Nonvisualized, bladder appears empty. Other: None. IMPRESSION: 1. Mild bilateral hydronephrosis, left greater than right. Hydronephrosis likely improved on the right and unchanged on the left as compared with CT from 06/03/2020. 2. Reported urinary tract stents are not demonstrated on today's ultrasound. Electronically Signed   By: Donavan Foil M.D.   On: 06/05/2020 16:14   DG CHEST PORT 1 VIEW  Result Date: 06/19/2020 CLINICAL DATA:  69 year old female with metastatic breast cancer. Hydronephrosis. Cough. EXAM: PORTABLE CHEST 1 VIEW COMPARISON:  Chest CT 09/18/2019 and earlier. FINDINGS: Portable AP semi upright view at 1800 hours. Stable right chest porta cath. Mildly lower lung volumes. Mediastinal contours remain normal. Allowing for portable technique the lungs are clear. No pneumothorax or pleural effusion. Stable surgical clips at the left axilla, left thoracic inlet. Prior cervical ACDF. Bulky deformity of the mid right clavicle is new since 2017. And there is around osteo lytic lesion of the right humeral head with adjacent minimally displaced chronic fracture. Negative visible bowel gas pattern. IMPRESSION: 1. No acute cardiopulmonary abnormality. 2. Bone metastases and chronic fractures about the right shoulder. Electronically Signed   By: Genevie Ann M.D.   On: 06/19/2020 19:59   DG C-Arm 1-60 Min-No Report  Result Date: 06/07/2020 Fluoroscopy was utilized by the requesting physician.  No radiographic interpretation.   DG C-Arm 1-60 Min-No Report  Result Date: 06/03/2020 Fluoroscopy was utilized by the requesting physician.  No radiographic interpretation.   CT Renal Stone Study  Result Date: 06/17/2020 CLINICAL DATA:  Follow-up hydronephrosis. Worsening left flank pain. EXAM: CT ABDOMEN AND PELVIS WITHOUT CONTRAST TECHNIQUE: Multidetector CT imaging of the abdomen and pelvis  was performed following the standard protocol without IV contrast. COMPARISON:  06/06/2020 FINDINGS: Lower chest: Resolution of left effusion. Abnormal interstitial pulmonary density persists. Hepatobiliary: Normal without contrast. Pancreas: Normal Spleen: Normal Adrenals/Urinary Tract: Adrenal glands are normal. Bilateral ureteral stents remain in place, with the ends in the renal collecting system and bladder. Considerable reduction in perinephric edema previously seen on the left. Moderate fullness of the left renal collecting system does persist. Mild fullness of the right renal collecting system persists. Stomach/Bowel: Previous colostomy and distal colectomy. Soft tissue thickening in the presacral soft tissues appears unchanged. Vascular/Lymphatic: Aortic atherosclerosis. No aneurysm. IVC is normal. No retroperitoneal adenopathy is seen. Reproductive: Previous hysterectomy. Other: No free fluid or air. Musculoskeletal: Scattered osseous metastatic lesions are not significantly changed since the recent examinations. IMPRESSION: 1. Bilateral ureteral stents remain in place, with the ends in the renal collecting systems and bladder. Considerable reduction in perinephric  edema previously seen on the left. Moderate fullness of the left renal collecting system does persist. Mild fullness of the right renal collecting system persists. 2. Previous colostomy and distal colectomy. Soft tissue thickening in the presacral soft tissues appears unchanged. Scattered osseous metastatic lesions are not significantly changed. 3. Resolution of left effusion. Abnormal pulmonary interstitial pulmonary density persists. 4. Aortic atherosclerosis. Aortic Atherosclerosis (ICD10-I70.0). Electronically Signed   By: Nelson Chimes M.D.   On: 06/17/2020 15:09   CT RENAL STONE STUDY  Result Date: 06/03/2020 CLINICAL DATA:  Elevated creatinine, flank pain, no history of renal stones, history of left breast cancer EXAM: CT ABDOMEN AND  PELVIS WITHOUT CONTRAST TECHNIQUE: Multidetector CT imaging of the abdomen and pelvis was performed following the standard protocol without IV contrast. COMPARISON:  PET-CT, 08/10/2019 FINDINGS: Lower chest: Small bilateral pleural effusions and associated atelectasis or consolidation. Interlobular septal thickening throughout the included lower lungs, in addition to bandlike scarring or atelectasis. Hepatobiliary: No solid liver abnormality is seen. No gallstones, gallbladder wall thickening, or biliary dilatation. Pancreas: Unremarkable. No pancreatic ductal dilatation or surrounding inflammatory changes. Spleen: Normal in size without significant abnormality. Adrenals/Urinary Tract: Adrenal glands are unremarkable. There is moderate right, mild left hydronephrosis and proximal hydroureter without visualized obstructing calculi or other obvious etiology. There is retroperitoneal fat stranding in the vicinity of the mid ureters (series 2, image 42) and calcifications which are of the iliac vessels and do not appear to be within the ureters. Bladder is unremarkable. Stomach/Bowel: Stomach is within normal limits. The appendix is not clearly visualized. Status post abdominoperineal resection with left lower quadrant end colostomy. Vascular/Lymphatic: Aortic atherosclerosis. No enlarged abdominal or pelvic lymph nodes. Reproductive: The uterus is present although in an abnormally posterior location status post abdominoperineal resection. Other: No abdominal wall hernia or abnormality. No abdominopelvic ascites. Musculoskeletal: No acute or significant osseous findings. IMPRESSION: 1. There is moderate right, mild left hydronephrosis and proximal hydroureter without visualized obstructing calculi or other obvious etiology. There is retroperitoneal fat stranding in the vicinity of the mid ureters which is of uncertain nature. Contrast enhanced CT urogram may be helpful to better evaluate for obstructing etiology. 2.  Status post abdominoperineal resection with left lower quadrant end colostomy. Appearance of the low pelvis is unchanged compared to prior PET-CT dated 08/10/2019 without evidence of mass or lymphadenopathy. 3. Small bilateral pleural effusions and associated atelectasis or consolidation. Interlobular septal thickening throughout the included lower lungs, in addition to bandlike scarring or atelectasis. Findings are most consistent with pulmonary edema. 4. Aortic Atherosclerosis (ICD10-I70.0). Electronically Signed   By: Eddie Candle M.D.   On: 06/03/2020 11:31   IR NEPHROSTOMY PLACEMENT LEFT  Result Date: 06/18/2020 CLINICAL DATA:  History of metastatic breast carcinoma, bilateral hydronephrosis, bilateral ureteral stent placement and worsening renal function with acute left flank pain. Due to worsening hydronephrosis and acute left flank pain, decision has been made to place a left percutaneous nephrostomy tube at this time. EXAM: 1. ULTRASOUND GUIDANCE FOR PUNCTURE OF THE LEFT RENAL COLLECTING SYSTEM. 2. LEFT PERCUTANEOUS NEPHROSTOMY TUBE PLACEMENT. COMPARISON:  CT of the abdomen and pelvis as well as renal ultrasound earlier today. ANESTHESIA/SEDATION: 3.0 mg IV Versed; 100 mcg IV Fentanyl. Total Moderate Sedation Time 18 minutes. Moderate (conscious) sedation was employed during this procedure. A total of Versed 3.0 mg and Fentanyl 100 mcg was administered intravenously. The patient's level of consciousness and vital signs were monitored continuously by radiology nursing throughout the procedure under my direct supervision. CONTRAST:  12  ml Omnipaque 300 MEDICATIONS: 2 g IV Ancef. Antibiotic was administered in an appropriate time frame prior to skin puncture. FLUOROSCOPY TIME:  1 minutes and 18 seconds.  8.0 mGy. PROCEDURE: The procedure, risks, benefits, and alternatives were explained to the patient. Questions regarding the procedure were encouraged and answered. The patient understands and consents to  the procedure. A time-out was performed prior to initiating the procedure. Ultrasound was utilized to localize the left kidney. The left flank region was prepped with chlorhexidine in a sterile fashion, and a sterile drape was applied covering the operative field. A sterile gown and sterile gloves were used for the procedure. Local anesthesia was provided with 1% Lidocaine. Under direct ultrasound guidance, a 21 gauge needle was advanced into the renal collecting system. Ultrasound image documentation was performed. Aspiration of urine sample was performed followed by contrast injection. A transitional dilator was advanced over a guidewire. Percutaneous tract dilatation was then performed over the guidewire. A 10-French percutaneous nephrostomy tube was then advanced and formed in the collecting system. Catheter position was confirmed by fluoroscopy after contrast injection. The catheter was secured at the skin with a Prolene retention suture and Stat-Lock device. A gravity bag was placed. COMPLICATIONS: None. FINDINGS: Ultrasound demonstrates moderate hydronephrosis. Via interpolar caliceal access, a 10 French nephrostomy tube was advanced and formed in the renal pelvis. There is good return of urine from the nephrostomy tube after placement. IMPRESSION: Placement of 10 French left percutaneous nephrostomy tube via interpolar caliceal access. The tube was formed in the renal pelvis and connected to gravity bag drainage. Electronically Signed   By: Aletta Edouard M.D.   On: 06/18/2020 08:37    Microbiology: Recent Results (from the past 240 hour(s))  Respiratory Panel by RT PCR (Flu A&B, Covid) - Nasopharyngeal Swab     Status: None   Collection Time: 06/17/20  2:26 PM   Specimen: Nasopharyngeal Swab  Result Value Ref Range Status   SARS Coronavirus 2 by RT PCR NEGATIVE NEGATIVE Final    Comment: (NOTE) SARS-CoV-2 target nucleic acids are NOT DETECTED.  The SARS-CoV-2 RNA is generally detectable in  upper respiratoy specimens during the acute phase of infection. The lowest concentration of SARS-CoV-2 viral copies this assay can detect is 131 copies/mL. A negative result does not preclude SARS-Cov-2 infection and should not be used as the sole basis for treatment or other patient management decisions. A negative result may occur with  improper specimen collection/handling, submission of specimen other than nasopharyngeal swab, presence of viral mutation(s) within the areas targeted by this assay, and inadequate number of viral copies (<131 copies/mL). A negative result must be combined with clinical observations, patient history, and epidemiological information. The expected result is Negative.  Fact Sheet for Patients:  PinkCheek.be  Fact Sheet for Healthcare Providers:  GravelBags.it  This test is no t yet approved or cleared by the [place] FDA and  has been authorized for detection and/or diagnosis of SARS-CoV-2 by FDA under an Emergency Use Authorization (EUA). This EUA will remain  in effect (meaning this test can be used) for the duration of the COVID-19 declaration under Section 564(b)(1) of the Act, 21 U.S.C. section 360bbb-3(b)(1), unless the authorization is terminated or revoked sooner.     Influenza A by PCR NEGATIVE NEGATIVE Final   Influenza B by PCR NEGATIVE NEGATIVE Final    Comment: (NOTE) The Xpert Xpress SARS-CoV-2/FLU/RSV assay is intended as an aid in  the diagnosis of influenza from Nasopharyngeal swab specimens and  should not be used as a sole basis for treatment. Nasal washings and  aspirates are unacceptable for Xpert Xpress SARS-CoV-2/FLU/RSV  testing.  Fact Sheet for Patients: PinkCheek.be  Fact Sheet for Healthcare Providers: GravelBags.it  This test is not yet approved or cleared by the [place] FDA and  has been  authorized for detection and/or diagnosis of SARS-CoV-2 by  FDA under an Emergency Use Authorization (EUA). This EUA will remain  in effect (meaning this test can be used) for the duration of the  Covid-19 declaration under Section 564(b)(1) of the Act, 21  U.S.C. section 360bbb-3(b)(1), unless the authorization is  terminated or revoked. Performed at [facility], [place] [address], [place], [place] [address]      Labs: Basic Metabolic Panel: Recent Labs  Lab 06/18/20 0621 06/19/20 0347 06/20/20 0841 06/21/20 0340 06/22/20 0311  NA 133* 135 138 140 139  K 4.5 3.7 3.4* 4.0 3.9  CL 103 103 105 108 107  CO2 '22 25 24 ' 21* 22  GLUCOSE 92 94 121* 90 92  BUN 41* 25* '15 13 14  ' CREATININE 5.31* 3.40* 1.93* 1.73* 1.72*  CALCIUM 7.8* 7.3* 7.7* 7.9* 7.9*  PHOS  --   --   --  2.7 2.7   Liver Function Tests: Recent Labs  Lab 06/17/20 1204 06/17/20 1204 06/18/20 0621 06/19/20 0347 06/20/20 0841 06/21/20 0340 06/22/20 0311  AST 13*  --  13* 14* 14*  --   --   ALT 10  --  '8 7 7  ' --   --   ALKPHOS 63  --  54 55 57  --   --   BILITOT 0.5  --  0.5 0.2* 0.4  --   --   PROT 7.0  --  5.8* 5.9* 6.3*  --   --   ALBUMIN 3.4*   < > 2.8* 2.7* 2.9* 2.8* 2.8*   < > = values in this interval not displayed.   Recent Labs  Lab 06/17/20 1204  LIPASE 26   No results for input(s): AMMONIA in the last 168 hours. CBC: Recent Labs  Lab 06/17/20 1204 06/18/20 0621 06/19/20 0347 06/20/20 0841  WBC 11.3* 7.1 6.1 5.5  NEUTROABS 10.0*  --   --   --   HGB 9.4* 8.3* 7.9* 8.2*  HCT 30.0* 26.4* 24.1* 25.3*  MCV 106.0* 107.3* 103.9* 102.4*  PLT 327 263 281 315   Cardiac Enzymes: No results for input(s): CKTOTAL, CKMB, CKMBINDEX, TROPONINI in the last 168 hours. BNP: BNP (last 3 results) No results for input(s): BNP in the last 8760 hours.  ProBNP (last 3 results) No results for input(s): PROBNP in the last 8760 hours.  CBG: No results for input(s): GLUCAP in the last 168  hours.     Signed:  Kayleen Memos, MD [facility] 06/22/2020, 1:41 PM

## 2020-06-22 NOTE — TOC Progression Note (Signed)
Transition of Care [facility]) - Progression Note    Patient Details  Name: ANYAH SWALLOW MRN: [id number] Date of Birth: 07/16/1951  [facility] CM/SW Contact  Joaquin Courts, RN Phone Number: 06/22/2020, 3:59 PM  Clinical Narrative:    Orders for home fluids faxed to pharmacy, home health is in place to begin services on 11/14.  IV fluids to be delivered to patient's home.   Expected Discharge Plan: [place] Barriers to Discharge: Continued Medical Work up  Expected Discharge Plan and Services Expected Discharge Plan: [place]   Discharge Planning Services: CM Consult   Living arrangements for the past 2 months: Single Family Home Expected Discharge Date: 06/22/20                         HH Arranged: RN, Nurse's Aide, Social Work [employer] Agency: [place] ([place]) Date [place]: 06/21/20 Time [place]: 1245 Representative spoke with at [place]: [place] ([place]) Interventions    Readmission Risk Interventions No flowsheet data found.

## 2020-06-22 NOTE — Progress Notes (Signed)
Pt discharged at this time. Tele box removed, PIV removed, colostomy bag emptied and urostomy bag emptied. Pt alert and oriented times 4 and stable. Pt out via wheelchair. Sister taking her home. Family and pt did the teachback with the discharge instructions.

## 2020-06-23 DIAGNOSIS — D539 Nutritional anemia, unspecified: Secondary | ICD-10-CM | POA: Diagnosis not present

## 2020-06-23 DIAGNOSIS — G620 Drug-induced polyneuropathy: Secondary | ICD-10-CM | POA: Diagnosis not present

## 2020-06-23 DIAGNOSIS — E440 Moderate protein-calorie malnutrition: Secondary | ICD-10-CM | POA: Diagnosis not present

## 2020-06-23 DIAGNOSIS — C50919 Malignant neoplasm of unspecified site of unspecified female breast: Secondary | ICD-10-CM | POA: Diagnosis not present

## 2020-06-23 DIAGNOSIS — Z85048 Personal history of other malignant neoplasm of rectum, rectosigmoid junction, and anus: Secondary | ICD-10-CM | POA: Diagnosis not present

## 2020-06-23 DIAGNOSIS — M810 Age-related osteoporosis without current pathological fracture: Secondary | ICD-10-CM | POA: Diagnosis not present

## 2020-06-23 DIAGNOSIS — C50812 Malignant neoplasm of overlapping sites of left female breast: Secondary | ICD-10-CM | POA: Diagnosis not present

## 2020-06-23 DIAGNOSIS — F419 Anxiety disorder, unspecified: Secondary | ICD-10-CM | POA: Diagnosis not present

## 2020-06-23 DIAGNOSIS — K219 Gastro-esophageal reflux disease without esophagitis: Secondary | ICD-10-CM | POA: Diagnosis not present

## 2020-06-23 DIAGNOSIS — Z933 Colostomy status: Secondary | ICD-10-CM | POA: Diagnosis not present

## 2020-06-23 DIAGNOSIS — N179 Acute kidney failure, unspecified: Secondary | ICD-10-CM | POA: Diagnosis not present

## 2020-06-23 DIAGNOSIS — N131 Hydronephrosis with ureteral stricture, not elsewhere classified: Secondary | ICD-10-CM | POA: Diagnosis not present

## 2020-06-23 DIAGNOSIS — C7951 Secondary malignant neoplasm of bone: Secondary | ICD-10-CM | POA: Diagnosis not present

## 2020-06-23 DIAGNOSIS — N1832 Chronic kidney disease, stage 3b: Secondary | ICD-10-CM | POA: Diagnosis not present

## 2020-06-23 DIAGNOSIS — I129 Hypertensive chronic kidney disease with stage 1 through stage 4 chronic kidney disease, or unspecified chronic kidney disease: Secondary | ICD-10-CM | POA: Diagnosis not present

## 2020-06-23 DIAGNOSIS — Z436 Encounter for attention to other artificial openings of urinary tract: Secondary | ICD-10-CM | POA: Diagnosis not present

## 2020-06-23 DIAGNOSIS — E860 Dehydration: Secondary | ICD-10-CM | POA: Diagnosis not present

## 2020-06-23 DIAGNOSIS — T451X5D Adverse effect of antineoplastic and immunosuppressive drugs, subsequent encounter: Secondary | ICD-10-CM | POA: Diagnosis not present

## 2020-06-23 DIAGNOSIS — F32A Depression, unspecified: Secondary | ICD-10-CM | POA: Diagnosis not present

## 2020-06-24 ENCOUNTER — Telehealth: Payer: Self-pay | Admitting: Pharmacist

## 2020-06-24 ENCOUNTER — Other Ambulatory Visit: Payer: Self-pay | Admitting: Oncology

## 2020-06-24 DIAGNOSIS — Z1501 Genetic susceptibility to malignant neoplasm of breast: Secondary | ICD-10-CM

## 2020-06-24 DIAGNOSIS — N1330 Unspecified hydronephrosis: Secondary | ICD-10-CM

## 2020-06-24 DIAGNOSIS — C50919 Malignant neoplasm of unspecified site of unspecified female breast: Secondary | ICD-10-CM

## 2020-06-24 DIAGNOSIS — Z1509 Genetic susceptibility to other malignant neoplasm: Secondary | ICD-10-CM

## 2020-06-24 DIAGNOSIS — Z170 Estrogen receptor positive status [ER+]: Secondary | ICD-10-CM

## 2020-06-24 DIAGNOSIS — C210 Malignant neoplasm of anus, unspecified: Secondary | ICD-10-CM

## 2020-06-24 DIAGNOSIS — C7951 Secondary malignant neoplasm of bone: Secondary | ICD-10-CM

## 2020-06-24 DIAGNOSIS — C50812 Malignant neoplasm of overlapping sites of left female breast: Secondary | ICD-10-CM

## 2020-06-24 MED ORDER — TALAZOPARIB TOSYLATE 0.25 MG PO CAPS: 0.5000 mg | ORAL_CAPSULE | Freq: Every day | ORAL | 3 refills | Status: DC

## 2020-06-24 NOTE — Telephone Encounter (Signed)
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  Prescription dose and frequency assessed for appropriateness. Dose adjusted given baseline renal impairment.   CMP and CBC from 06/20/20 assessed, as well as labs from 06/22/20: noted Scr most recently 1.72 mg/dL (CrCl ~32 mL/min) - baseline dose of Talzenna adjusted for renal impairment. Patient's Hgb 8.2 g/dL - recommend close monitoring after therapy initiation.   Current medication list in Epic reviewed, no relevant/significant DDIs with Talzenna identified.  Evaluated chart and no patient barriers to medication adherence noted.   Prescription has been e-scribed to the [facility] for benefits analysis and approval.  Oral Oncology Clinic will continue to follow for insurance authorization, copayment issues, initial counseling and start date.  Leron Croak, PharmD, BCPS Hematology/Oncology Clinical Pharmacist [facility] [phone] 06/24/2020 11:20 AM

## 2020-06-24 NOTE — Progress Notes (Signed)
[name] was finally discharged.  I have entered an order for PET scan hopefully this week and a virtual visit with me to follow.  I am also entering orders for her to start talazoparib at reduced doses (she is BRCA2 positive)

## 2020-06-24 NOTE — Telephone Encounter (Signed)
Oral Oncology Pharmacist Encounter   Received notification that prior authorization for [name] is required.  PA submitted on CoverMyMeds Key: [id number] Status is pending  Oral Oncology Clinic will continue to follow.  Leron Croak, PharmD, BCPS Hematology/Oncology Clinical Pharmacist [facility] [phone] 06/24/2020 11:41 AM

## 2020-06-24 NOTE — Progress Notes (Unsigned)
[name]  [phone] [phone]) [phone]     ID: [name] DOB: 08-29-1950  MR#: [id number]  TGG#:[id number]  Patient Care Team: Celene Squibb, MD as PCP - General (Internal Medicine) Chauncey Cruel, MD OTHER MD:  CHIEF COMPLAINT:   CURRENT TREATMENT:    HISTORY OF CURRENT ILLNESS:   The patient's subsequent history is as detailed below.  INTERVAL HISTORY: {The patient} was evaluated in the {multidisciplinary} breast cancer clinic on {date} {accompanied by              }. Her case was also presented at the multidisciplinary breast cancer conference on the same day. At that time a preliminary plan was proposed:   REVIEW OF SYSTEMS:  PAST MEDICAL HISTORY: Past Medical History:  Diagnosis Date  . 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 Bladder infection   . Bone metastasis ([facility] [place]) 01/17/2016  . Borderline hypertension   . Breast cancer ([facility])   . Closed fracture of humerus sept 2013  . Depression   . Family history of adverse reaction to anesthesia    father remembered his surgery  . Fatigue   . GERD (gastroesophageal reflux disease)   . Hypertension   . 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 Osteoporosis 01/27/2011  . Pneumonia   . Port catheter in place 04/14/2013  . Post-mastectomy lymphedema syndrome   . 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 Reflux   . Retinal detachment   . 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 Swollen lymph nodes   . URI (upper respiratory infection) 02/17/12  . Wears glasses     PAST SURGICAL HISTORY: Past Surgical History:  Procedure Laterality Date  . ABDOMINAL PERINEAL BOWEL RESECTION N/A 09/01/2016   Procedure: ABDOMINAL PERINEAL RESECTION PERMANENT COLOSTOMY;  Surgeon: Leighton Ruff, MD;  Location: [facility];  Service: General;  Laterality: N/A;  . biopsy of lymph node     super clavicle   . BTL    . CATARACT EXTRACTION    . cervical disc fusioni    . CYSTOSCOPY W/ URETERAL STENT PLACEMENT Bilateral 06/03/2020   Procedure: CYSTOSCOPY WITH RETROGRADE PYELOGRAM/URETERAL STENT PLACEMENT bladder biopsy;  Surgeon: [name], MD;  Location: [facility];  Service: Urology;  Laterality: Bilateral;  . CYSTOSCOPY WITH STENT PLACEMENT Left 06/07/2020   Procedure: CYSTOSCOPY LEFT STENT EXCHANGE, retrograde pylogram;  Surgeon: [name], MD;  Location: [facility];  Service: Urology;  Laterality: Left;  . IR NEPHROSTOMY PLACEMENT LEFT  06/17/2020  . LYMPH NODE BIOPSY Right 06/19/2015   Procedure: RIGHT NECK LYMPH NODE EXCISION;  Surgeon: Rolm Bookbinder, MD;  Location: [facility];  Service: General;  Laterality: Right;  . LYMPH NODE DISSECTION Left 04/26/2017   Procedure: EXCISION LEFT NECK LYMPH NODE;  Surgeon: Rolm Bookbinder, MD;  Location: [facility];  Service: General;  Laterality: Left;  Marland Kitchen MASS EXCISION Left 07/27/2016   Procedure: EXCISION OF POSTERIOR NECK MASS;  Surgeon: Rolm Bookbinder, MD;  Location: [facility];  Service: General;  Laterality: Left;  Marland Kitchen MASTECTOMY MODIFIED RADICAL     left breast  . Port- a-cath insertion    . retinal detachment and repair      FAMILY HISTORY Family History  Problem Relation Age of Onset  . Hypertension Mother   . Heart disease Mother  atrial fib  . Heart disease Father   . Other Father        glaucoma    GYNECOLOGIC HISTORY:  No LMP recorded. Patient is postmenopausal. Menarche: *** years old Age at first live birth: *** years old [place] *** LMP *** Contraceptive*** HRT ***  Hysterectomy? *** Salpingo-oophorectomy?***    SOCIAL HISTORY:      ADVANCED DIRECTIVES:    HEALTH MAINTENANCE: Social History   Tobacco Use  . Smoking status: Never Smoker  . Smokeless tobacco: Never Used  Vaping Use  . Vaping Use: Never used  Substance Use Topics  . Alcohol use: Yes     Alcohol/week: 7.0 standard drinks    Types: 7 Glasses of wine per week    Comment: 1 drink daily  . Drug use: Yes    Types: Marijuana    Comment: During chemo, none over 1 Year     Colonoscopy:  PAP:  Bone density:   Allergies  Allergen Reactions  . Ciprofloxacin Other (See Comments)    tendonitis  . Prochlorperazine Anxiety    Other reaction(s): Confusion    Current Outpatient Medications  Medication Sig Dispense Refill  . acetaminophen (TYLENOL) 500 MG tablet Take 1,000 mg by mouth every 6 (six) hours as needed for moderate pain or headache.    Marland Kitchen buPROPion (WELLBUTRIN SR) 100 MG 12 hr tablet Take 100 mg by mouth 2 (two) times daily.    Marland Kitchen CALCIUM-VITAMIN D PO Take 1 tablet by mouth 2 (two) times daily.     Marland Kitchen denosumab (XGEVA) 120 MG/1.7ML SOLN injection Inject 120 mg into the skin every 3 (three) months.    . docusate sodium (COLACE) 100 MG capsule Take 1 capsule (100 mg total) by mouth daily. 10 capsule 0  . feeding supplement (ENSURE ENLIVE / ENSURE PLUS) LIQD Take 237 mLs by mouth daily for 7 days. 1659 mL 0  . lactose free nutrition (BOOST PLUS) LIQD Take 237 mLs by mouth daily for 7 days. 1659 mL 0  . metoprolol succinate (TOPROL-XL) 25 MG 24 hr tablet Take 25 mg by mouth in the morning and at bedtime.   5  . Multiple Vitamin (MULTIVITAMIN WITH MINERALS) TABS tablet Take 1 tablet by mouth daily. 90 tablet 0  . omeprazole (PRILOSEC) 40 MG capsule Take 1 capsule (40 mg total) by mouth 2 (two) times daily. (Patient taking differently: Take 40 mg by mouth in the morning and at bedtime. ) 180 capsule 4  . polyethylene glycol (MIRALAX / GLYCOLAX) 17 g packet Take 17 g by mouth daily. 14 each 0  . potassium chloride (KLOR-CON) 10 MEQ tablet Take 20 mEq by mouth 2 (two) times daily.    . prochlorperazine (COMPAZINE) 5 MG tablet Take 1 tablet (5 mg total) by mouth 4 (four) times daily -  before meals and at bedtime. 30 tablet 0  . sodium chloride 0.9 % infusion Inject 1,000 mLs into  the vein daily. 30000 mL 0  . talazoparib tosylate (TALZENNA) 0.25 MG capsule Take 2 capsules (0.5 mg total) by mouth daily. To start 07/01/2020 60 capsule 3  . tamsulosin (FLOMAX) 0.4 MG CAPS capsule Take 1 capsule (0.4 mg total) by mouth 2 (two) times daily. 60 capsule 1  . vitamin B-12 (CYANOCOBALAMIN) 1000 MCG tablet Take 1,000 mcg by mouth 2 (two) times daily.      No current facility-administered medications for this visit.    OBJECTIVE:  There were no vitals filed for this visit.  There is no height or weight on file to calculate BMI.   Wt Readings from Last 3 Encounters:  06/17/20 123 lb 4.8 oz (55.9 kg)  06/07/20 132 lb (59.9 kg)  01/25/19 130 lb (59 kg)      ECOG FS:{CHL ONC [id number]}  Ocular: Sclerae unicteric, pupils round and equal Ear-nose-throat: Oropharynx clear and moist Lymphatic: No cervical or supraclavicular adenopathy Lungs no rales or rhonchi Heart regular rate and rhythm Abd soft, nontender, positive bowel sounds MSK no focal spinal tenderness, no joint edema Neuro: non-focal, well-oriented, appropriate affect Breasts:    LAB RESULTS:  CMP     Component Value Date/Time   NA 139 06/22/2020 0311   K 3.9 06/22/2020 0311   CL 107 06/22/2020 0311   CO2 22 06/22/2020 0311   GLUCOSE 92 06/22/2020 0311   BUN 14 06/22/2020 0311   CREATININE 1.72 (H) 06/22/2020 0311   CALCIUM 7.9 (L) 06/22/2020 0311   PROT 6.3 (L) 06/20/2020 0841   ALBUMIN 2.8 (L) 06/22/2020 0311   AST 14 (L) 06/20/2020 0841   ALT 7 06/20/2020 0841   ALKPHOS 57 06/20/2020 0841   BILITOT 0.4 06/20/2020 0841   GFRNONAA 32 (L) 06/22/2020 0311   GFRAA >60 10/07/2016 0834    No results found for: TOTALPROTELP, ALBUMINELP, A1GS, A2GS, BETS, BETA2SER, GAMS, MSPIKE, SPEI  No results found for: KPAFRELGTCHN, LAMBDASER, KAPLAMBRATIO  Lab Results  Component Value Date   WBC 5.5 06/20/2020   NEUTROABS 10.0 (H) 06/17/2020   HGB 8.2 (L) 06/20/2020   HCT 25.3 (L) 06/20/2020   MCV  102.4 (H) 06/20/2020   PLT 315 06/20/2020      Chemistry      Component Value Date/Time   NA 139 06/22/2020 0311   K 3.9 06/22/2020 0311   CL 107 06/22/2020 0311   CO2 22 06/22/2020 0311   BUN 14 06/22/2020 0311   CREATININE 1.72 (H) 06/22/2020 0311      Component Value Date/Time   CALCIUM 7.9 (L) 06/22/2020 0311   ALKPHOS 57 06/20/2020 0841   AST 14 (L) 06/20/2020 0841   ALT 7 06/20/2020 0841   BILITOT 0.4 06/20/2020 0841       Lab Results  Component Value Date   LABCA2 33.2 08/11/2016    No components found for: [id number]  Recent Labs  Lab 06/17/20 1638  INR 1.1    Lab Results  Component Value Date   LABCA2 33.2 08/11/2016    No results found for: LEX517  No results found for: GYF749  Lab Results  Component Value Date   CAN153 26.4 (H) 08/11/2016    No results found for: CA2729  No components found for: HGQUANT  No results found for: CEA1 / No results found for: CEA1   No results found for: AFPTUMOR  No results found for: CHROMOGRNA  No results found for: PSA1  Admission on 06/17/2020, Discharged on 06/22/2020  Component Date Value Ref Range Status  . Sodium 06/17/2020 130* 135 - 145 mmol/L Final  . Potassium 06/17/2020 6.0* 3.5 - 5.1 mmol/L Final  . Chloride 06/17/2020 101  98 - 111 mmol/L Final  . CO2 06/17/2020 20* 22 - 32 mmol/L Final  . Glucose, Bld 06/17/2020 88  70 - 99 mg/dL Final   Glucose reference range applies only to samples taken after fasting for at least 8 hours.  . BUN 06/17/2020 46* 8 - 23 mg/dL Final  . Creatinine, Ser 06/17/2020 6.87* 0.44 - 1.00 mg/dL Final  . Calcium 06/17/2020  8.4* 8.9 - 10.3 mg/dL Final  . Total Protein 06/17/2020 7.0  6.5 - 8.1 g/dL Final  . Albumin 06/17/2020 3.4* 3.5 - 5.0 g/dL Final  . AST 06/17/2020 13* 15 - 41 U/L Final  . ALT 06/17/2020 10  0 - 44 U/L Final  . Alkaline Phosphatase 06/17/2020 63  38 - 126 U/L Final  . Total Bilirubin 06/17/2020 0.5  0.3 - 1.2 mg/dL Final  . GFR, Estimated  06/17/2020 6* >60 mL/min Final   Comment: (NOTE) Calculated using the CKD-EPI Creatinine Equation (2021)   . Anion gap 06/17/2020 9  5 - 15 Final   Performed at [facility], [place] [address], [place], [place] [address]  . Lipase 06/17/2020 26  11 - 51 U/L Final   Performed at [facility], [place] [address], [place], [place] [address]  . WBC 06/17/2020 11.3* 4.0 - 10.5 K/uL Final  . RBC 06/17/2020 2.83* 3.87 - 5.11 MIL/uL Final  . Hemoglobin 06/17/2020 9.4* 12.0 - 15.0 g/dL Final  . HCT 06/17/2020 30.0* 36 - 46 % Final  . MCV 06/17/2020 106.0* 80.0 - 100.0 fL Final  . MCH 06/17/2020 33.2  26.0 - 34.0 pg Final  . MCHC 06/17/2020 31.3  30.0 - 36.0 g/dL Final  . RDW 06/17/2020 12.1  11.5 - 15.5 % Final  . Platelets 06/17/2020 327  150 - 400 K/uL Final  . nRBC 06/17/2020 0.0  0.0 - 0.2 % Final  . Neutrophils Relative % 06/17/2020 89  % Final  . Neutro Abs 06/17/2020 10.0* 1.7 - 7.7 K/uL Final  . Lymphocytes Relative 06/17/2020 6  % Final  . Lymphs Abs 06/17/2020 0.7  0.7 - 4.0 K/uL Final  . Monocytes Relative 06/17/2020 5  % Final  . Monocytes Absolute 06/17/2020 0.6  0.1 - 1.0 K/uL Final  . Eosinophils Relative 06/17/2020 0  % Final  . Eosinophils Absolute 06/17/2020 0.1  0.0 - 0.5 K/uL Final  . Basophils Relative 06/17/2020 0  % Final  . Basophils Absolute 06/17/2020 0.0  0.0 - 0.1 K/uL Final  . Immature Granulocytes 06/17/2020 0  % Final  . Abs Immature Granulocytes 06/17/2020 0.05  0.00 - 0.07 K/uL Final   Performed at [facility], [place] [address], [place], [place] [address]  . SARS Coronavirus 2 by RT PCR 06/17/2020 NEGATIVE  NEGATIVE Final   Comment: (NOTE) SARS-CoV-2 target nucleic acids are NOT DETECTED.  The SARS-CoV-2 RNA is generally detectable in upper respiratoy specimens during the acute phase of infection. The lowest concentration of SARS-CoV-2 viral copies this assay can detect is 131 copies/mL. A negative result  does not preclude SARS-Cov-2 infection and should not be used as the sole basis for treatment or other patient management decisions. A negative result may occur with  improper specimen collection/handling, submission of specimen other than nasopharyngeal swab, presence of viral mutation(s) within the areas targeted by this assay, and inadequate number of viral copies (<131 copies/mL). A negative result must be combined with clinical observations, patient history, and epidemiological information. The expected result is Negative.  Fact Sheet for Patients:  PinkCheek.be  Fact Sheet for Healthcare Providers:  GravelBags.it  This test is no                          t yet approved or cleared by the [place] FDA and  has been authorized for detection and/or diagnosis of SARS-CoV-2 by FDA under an Emergency  Use Authorization (EUA). This EUA will remain  in effect (meaning this test can be used) for the duration of the COVID-19 declaration under Section 564(b)(1) of the Act, 21 U.S.C. section 360bbb-3(b)(1), unless the authorization is terminated or revoked sooner.    . Influenza A by PCR 06/17/2020 NEGATIVE  NEGATIVE Final  . Influenza B by PCR 06/17/2020 NEGATIVE  NEGATIVE Final   Comment: (NOTE) The Xpert Xpress SARS-CoV-2/FLU/RSV assay is intended as an aid in  the diagnosis of influenza from Nasopharyngeal swab specimens and  should not be used as a sole basis for treatment. Nasal washings and  aspirates are unacceptable for Xpert Xpress SARS-CoV-2/FLU/RSV  testing.  Fact Sheet for Patients: PinkCheek.be  Fact Sheet for Healthcare Providers: GravelBags.it  This test is not yet approved or cleared by the [place] FDA and  has been authorized for detection and/or diagnosis of SARS-CoV-2 by  FDA under an Emergency Use Authorization (EUA). This EUA will remain   in effect (meaning this test can be used) for the duration of the  Covid-19 declaration under Section 564(b)(1) of the Act, 21  U.S.C. section 360bbb-3(b)(1), unless the authorization is  terminated or revoked. Performed at [facility], [place] [address], [place], [place] [address]   . Prothrombin Time 06/17/2020 13.9  11.4 - 15.2 seconds Final  . INR 06/17/2020 1.1  0.8 - 1.2 Final   Comment: (NOTE) INR goal varies based on device and disease states. Performed at [facility], [place] [address] [place], [place] 12/27/1950   . Sodium 06/18/2020 133* 135 - 145 mmol/L Final  . Potassium 06/18/2020 4.5  3.5 - 5.1 mmol/L Final   DELTA CHECK NOTED  . Chloride 06/18/2020 103  98 - 111 mmol/L Final  . CO2 06/18/2020 22  22 - 32 mmol/L Final  . Glucose, Bld 06/18/2020 92  70 - 99 mg/dL Final   Glucose reference range applies only to samples taken after fasting for at least 8 hours.  . BUN 06/18/2020 41* 8 - 23 mg/dL Final  . Creatinine, Ser 06/18/2020 5.31* 0.44 - 1.00 mg/dL Final  . Calcium 06/18/2020 7.8* 8.9 - 10.3 mg/dL Final  . Total Protein 06/18/2020 5.8* 6.5 - 8.1 g/dL Final  . Albumin 06/18/2020 2.8* 3.5 - 5.0 g/dL Final  . AST 06/18/2020 13* 15 - 41 U/L Final  . ALT 06/18/2020 8  0 - 44 U/L Final  . Alkaline Phosphatase 06/18/2020 54  38 - 126 U/L Final  . Total Bilirubin 06/18/2020 0.5  0.3 - 1.2 mg/dL Final  . GFR, Estimated 06/18/2020 8* >60 mL/min Final   Comment: (NOTE) Calculated using the CKD-EPI Creatinine Equation (2021)   . Anion gap 06/18/2020 8  5 - 15 Final   Performed at [facility], [place] [address] [place], [place] [address]  . WBC 06/18/2020 7.1  4.0 - 10.5 K/uL Final  . RBC 06/18/2020 2.46* 3.87 - 5.11 MIL/uL Final  . Hemoglobin 06/18/2020 8.3* 12.0 - 15.0 g/dL Final  . HCT 06/18/2020 26.4* 36 - 46 % Final  . MCV 06/18/2020 107.3* 80.0 - 100.0 fL Final  . MCH 06/18/2020 33.7  26.0 - 34.0 pg Final  .  MCHC 06/18/2020 31.4  30.0 - 36.0 g/dL Final  . RDW 06/18/2020 12.3  11.5 - 15.5 % Final  . Platelets 06/18/2020 263  150 - 400 K/uL Final  . nRBC 06/18/2020 0.0  0.0 - 0.2 % Final   Performed at [facility], [place] [address], [place], [place] [address]  . Sodium 06/19/2020 135  135 - 145 mmol/L Final  . Potassium 06/19/2020 3.7  3.5 - 5.1 mmol/L Final   DELTA CHECK NOTED  . Chloride 06/19/2020 103  98 - 111 mmol/L Final  . CO2 06/19/2020 25  22 - 32 mmol/L Final  . Glucose, Bld 06/19/2020 94  70 - 99 mg/dL Final   Glucose reference range applies only to samples taken after fasting for at least 8 hours.  . BUN 06/19/2020 25* 8 - 23 mg/dL Final  . Creatinine, Ser 06/19/2020 3.40* 0.44 - 1.00 mg/dL Final   DELTA CHECK NOTED  . Calcium 06/19/2020 7.3* 8.9 - 10.3 mg/dL Final  . Total Protein 06/19/2020 5.9* 6.5 - 8.1 g/dL Final  . Albumin 06/19/2020 2.7* 3.5 - 5.0 g/dL Final  . AST 06/19/2020 14* 15 - 41 U/L Final  . ALT 06/19/2020 7  0 - 44 U/L Final  . Alkaline Phosphatase 06/19/2020 55  38 - 126 U/L Final  . Total Bilirubin 06/19/2020 0.2* 0.3 - 1.2 mg/dL Final  . GFR, Estimated 06/19/2020 14* >60 mL/min Final   Comment: (NOTE) Calculated using the CKD-EPI Creatinine Equation (2021)   . Anion gap 06/19/2020 7  5 - 15 Final   Performed at [facility], [place] [address], [place], [place] [address]  . WBC 06/19/2020 6.1  4.0 - 10.5 K/uL Final  . RBC 06/19/2020 2.32* 3.87 - 5.11 MIL/uL Final  . Hemoglobin 06/19/2020 7.9* 12.0 - 15.0 g/dL Final  . HCT 06/19/2020 24.1* 36 - 46 % Final  . MCV 06/19/2020 103.9* 80.0 - 100.0 fL Final  . MCH 06/19/2020 34.1* 26.0 - 34.0 pg Final  . MCHC 06/19/2020 32.8  30.0 - 36.0 g/dL Final  . RDW 06/19/2020 12.2  11.5 - 15.5 % Final  . Platelets 06/19/2020 281  150 - 400 K/uL Final  . nRBC 06/19/2020 0.0  0.0 - 0.2 % Final   Performed at [facility], [place] [address], [place], [place] [address]  .  Sodium 06/20/2020 138  135 - 145 mmol/L Final  . Potassium 06/20/2020 3.4* 3.5 - 5.1 mmol/L Final  . Chloride 06/20/2020 105  98 - 111 mmol/L Final  . CO2 06/20/2020 24  22 - 32 mmol/L Final  . Glucose, Bld 06/20/2020 121* 70 - 99 mg/dL Final   Glucose reference range applies only to samples taken after fasting for at least 8 hours.  . BUN 06/20/2020 15  8 - 23 mg/dL Final  . Creatinine, Ser 06/20/2020 1.93* 0.44 - 1.00 mg/dL Final   DELTA CHECK NOTED  . Calcium 06/20/2020 7.7* 8.9 - 10.3 mg/dL Final  . Total Protein 06/20/2020 6.3* 6.5 - 8.1 g/dL Final  . Albumin 06/20/2020 2.9* 3.5 - 5.0 g/dL Final  . AST 06/20/2020 14* 15 - 41 U/L Final  . ALT 06/20/2020 7  0 - 44 U/L Final  . Alkaline Phosphatase 06/20/2020 57  38 - 126 U/L Final  . Total Bilirubin 06/20/2020 0.4  0.3 - 1.2 mg/dL Final  . GFR, Estimated 06/20/2020 28* >60 mL/min Final   Comment: (NOTE) Calculated using the CKD-EPI Creatinine Equation (2021)   . Anion gap 06/20/2020 9  5 - 15 Final   Performed at [facility], [place] [address] [place], [place] [address]  . WBC 06/20/2020 5.5  4.0 - 10.5 K/uL Final  . RBC 06/20/2020 2.47* 3.87 - 5.11 MIL/uL Final  . Hemoglobin  06/20/2020 8.2* 12.0 - 15.0 g/dL Final  . HCT 06/20/2020 25.3* 36 - 46 % Final  . MCV 06/20/2020 102.4* 80.0 - 100.0 fL Final  . MCH 06/20/2020 33.2  26.0 - 34.0 pg Final  . MCHC 06/20/2020 32.4  30.0 - 36.0 g/dL Final  . RDW 06/20/2020 12.2  11.5 - 15.5 % Final  . Platelets 06/20/2020 315  150 - 400 K/uL Final  . nRBC 06/20/2020 0.0  0.0 - 0.2 % Final   Performed at [facility], [place] [address] [place], [place] [address]  . Sodium, Ur 06/20/2020 150  mmol/L Final   Performed at [facility], [place] [address], [place], [place] [address]  . Calcium, Ur 06/20/2020 3.1  Not Estab. mg/dL Final  . Calcium/Creat.Ratio 06/20/2020 65  29 - 442 mg/g creat Final   Comment: (NOTE) Performed At: [facility] [place], [place] [id number] Rush Farmer MD [id number]   . Creatinine, Urine 06/20/2020 47.7  Not Estab. mg/dL Final  . Sodium 06/21/2020 140  135 - 145 mmol/L Final  . Potassium 06/21/2020 4.0  3.5 - 5.1 mmol/L Final  . Chloride 06/21/2020 108  98 - 111 mmol/L Final  . CO2 06/21/2020 21* 22 - 32 mmol/L Final  . Glucose, Bld 06/21/2020 90  70 - 99 mg/dL Final   Glucose reference range applies only to samples taken after fasting for at least 8 hours.  . BUN 06/21/2020 13  8 - 23 mg/dL Final  . Creatinine, Ser 06/21/2020 1.73* 0.44 - 1.00 mg/dL Final  . Calcium 06/21/2020 7.9* 8.9 - 10.3 mg/dL Final  . Phosphorus 06/21/2020 2.7  2.5 - 4.6 mg/dL Final  . Albumin 06/21/2020 2.8* 3.5 - 5.0 g/dL Final  . GFR, Estimated 06/21/2020 32* >60 mL/min Final   Comment: (NOTE) Calculated using the CKD-EPI Creatinine Equation (2021)   . Anion gap 06/21/2020 11  5 - 15 Final   Performed at [facility], [place] [address], [place], [place] [address]  . Sodium 06/22/2020 139  135 - 145 mmol/L Final  . Potassium 06/22/2020 3.9  3.5 - 5.1 mmol/L Final  . Chloride 06/22/2020 107  98 - 111 mmol/L Final  . CO2 06/22/2020 22  22 - 32 mmol/L Final  . Glucose, Bld 06/22/2020 92  70 - 99 mg/dL Final   Glucose reference range applies only to samples taken after fasting for at least 8 hours.  . BUN 06/22/2020 14  8 - 23 mg/dL Final  . Creatinine, Ser 06/22/2020 1.72* 0.44 - 1.00 mg/dL Final  . Calcium 06/22/2020 7.9* 8.9 - 10.3 mg/dL Final  . Phosphorus 06/22/2020 2.7  2.5 - 4.6 mg/dL Final  . Albumin 06/22/2020 2.8* 3.5 - 5.0 g/dL Final  . GFR, Estimated 06/22/2020 32* >60 mL/min Final   Comment: (NOTE) Calculated using the CKD-EPI Creatinine Equation (2021)   . Anion gap 06/22/2020 10  5 - 15 Final   Performed at [facility], [place] [address] [place], [place] [address]    (this displays the last labs from the last 3 days)  No results  found for: TOTALPROTELP, ALBUMINELP, A1GS, A2GS, BETS, BETA2SER, GAMS, MSPIKE, SPEI (this displays SPEP labs)  No results found for: KPAFRELGTCHN, LAMBDASER, KAPLAMBRATIO (kappa/lambda light chains)  No results found for: HGBA, HGBA2QUANT, HGBFQUANT, HGBSQUAN (Hemoglobinopathy evaluation)   Lab Results  Component Value Date   LDH 176 04/27/2008    Lab Results  Component Value Date   IRON  79 10/07/2016   TIBC 277 10/07/2016   IRONPCTSAT 28 10/07/2016   (Iron and TIBC)  Lab Results  Component Value Date   FERRITIN 156 10/07/2016    Urinalysis    Component Value Date/Time   COLORURINE STRAW (A) 06/03/2020 1613   APPEARANCEUR CLEAR 06/03/2020 1613   LABSPEC 1.008 06/03/2020 1613   PHURINE 6.0 06/03/2020 1613   GLUCOSEU NEGATIVE 06/03/2020 1613   HGBUR NEGATIVE 06/03/2020 1613   BILIRUBINUR NEGATIVE 06/03/2020 1613   KETONESUR NEGATIVE 06/03/2020 1613   PROTEINUR NEGATIVE 06/03/2020 1613   UROBILINOGEN 0.2 08/06/2008 1500   NITRITE NEGATIVE 06/03/2020 1613   LEUKOCYTESUR NEGATIVE 06/03/2020 1613     STUDIES: CT ABDOMEN PELVIS WO CONTRAST  Result Date: 06/06/2020 CLINICAL DATA:  Ureteral stent placement. Metastatic breast cancer. Left flank pain EXAM: CT ABDOMEN AND PELVIS WITHOUT CONTRAST TECHNIQUE: Multidetector CT imaging of the abdomen and pelvis was performed following the standard protocol without IV contrast. COMPARISON:  CT dated 06/03/2020 FINDINGS: Lower chest: There is a moderate to large partially visualized left-sided pleural effusion which has increased in size since the prior study.The heart size appears grossly normal. The previously demonstrated right-sided pleural effusion has improved. There are areas of atelectasis at the lung bases bilaterally with scattered interlobular septal thickening. Hepatobiliary: The liver is normal. There is layering hyperdense material in the gallbladder lumen, new since prior study. This may represent vicarious excretion of  contrast versus less likely gallbladder sludge.There is no biliary ductal dilation. Pancreas: Normal contours without ductal dilatation. No peripancreatic fluid collection. Spleen: Unremarkable. Adrenals/Urinary Tract: --Adrenal glands: Unremarkable. --Right kidney/ureter: The patient has undergone right-sided double-J ureteral stent placement. The degree of hydronephrosis is largely unchanged from prior study. --Left kidney/ureter: The patient has undergone left-sided ureteral stent placement. The stent appears well positioned. The degree of hydronephrosis is largely unchanged despite stent placement. There is extensive increased perinephric fat stranding about the left kidney with new perinephric/retroperitoneal free fluid. --Urinary bladder: The bladder is decompressed by Foley catheter. Stomach/Bowel: --Stomach/Duodenum: No hiatal hernia or other gastric abnormality. Normal duodenal course and caliber. --Small bowel: Unremarkable. --Colon: Patient is status post placement of an end colostomy in the left lower quadrant. There is no evidence for an obstruction. --Appendix: Normal. Vascular/Lymphatic: Atherosclerotic calcification is present within the non-aneurysmal abdominal aorta, without hemodynamically significant stenosis. --No retroperitoneal lymphadenopathy. --No mesenteric lymphadenopathy. --No pelvic or inguinal lymphadenopathy. Reproductive: Status post hysterectomy. No adnexal mass. Other: No ascites or free air. There is relatively unchanged presacral soft tissue. Musculoskeletal. Metastatic osseous lesions are noted in the lumbar spine. Osseous metastatic disease is noted to the ribs. IMPRESSION: 1. Largely unchanged bilateral hydroureteronephrosis despite well placed double-J ureteral stents. On the left, there is significant interval increase in perinephric fat stranding and retroperitoneal free fluid. Findings may be secondary to recent instrumentation versus a forniceal rupture. There is no well  organized fluid collection to suggest a developing abscess, however evaluation is limited by lack of IV contrast. 2. Growing left-sided moderate to large pleural effusion. Significant interval decrease in the previously demonstrated right-sided pleural effusion. 3. Additional chronic stable findings are noted as detailed above. 4.  Aortic Atherosclerosis (ICD10-I70.0). Electronically Signed   By: Constance Holster M.D.   On: 06/06/2020 15:29   DG Abdomen 1 View  Result Date: 06/17/2020 CLINICAL DATA:  Abdominal pain EXAM: ABDOMEN - 1 VIEW COMPARISON:  CT abdomen and pelvis June 06, 2020. FINDINGS: Double-J stents are present bilaterally, stable. Ostomy left mid abdomen, unchanged. No abnormal calcifications demonstrable.  No bowel dilatation or air-fluid level to suggest bowel obstruction. No free air on supine examination. IMPRESSION: Stable appearing double-J stents, unchanged in positions. Ostomy left mid abdomen. No abnormal calcification. No bowel obstruction or free air evident. Electronically Signed   By: Lowella Grip III M.D.   On: 06/17/2020 13:42   US Renal  Result Date: 06/17/2020 CLINICAL DATA:  Progressive flank region pain EXAM: RENAL / URINARY TRACT ULTRASOUND COMPLETE COMPARISON:  Renal ultrasound June 05, 2020; CT abdomen and pelvis June 06, 2020 FINDINGS: Right Kidney: Renal measurements: 11.0 x 6.0 x 7.0 cm = volume: 267 mL. Echogenicity and renal cortical thickness are within normal limits. No mass or perinephric fluid visualized. Scarring lateral right kidney noted. There is moderate hydronephrosis on the right. A stent is present extending from the right renal pelvis extending to the bladder. Left Kidney: Renal measurements: 11.5 x 6.5 x 5.5 cm = volume: 215 mL. Echogenicity and renal cortical thickness are within normal limits. No mass or perinephric fluid visualized. Moderate hydronephrosis. Double-J stent extending from left renal pelvis to the bladder level. Bladder:  Double-J stents in bladder.  Bladder virtually empty. Other: None. IMPRESSION: Double-J stents present bilaterally. Despite presence of stents, moderate hydronephrosis is present on each side. Scarring lateral right kidney. Study otherwise unremarkable. Electronically Signed   By: Lowella Grip III M.D.   On: 06/17/2020 13:47   US RENAL  Result Date: 06/05/2020 CLINICAL DATA:  Left-sided flank pain status post stent placement EXAM: RENAL / URINARY TRACT ULTRASOUND COMPLETE COMPARISON:  CT 06/03/2020 FINDINGS: Right Kidney: Renal measurements: 10.2 x 4.9 x 6 cm = volume: 157 mL. Echogenicity within normal limits. Mild right hydronephrosis, likely decreased as compared with prior CT. No mass. Left Kidney: Renal measurements: 11.5 x 6.3 x 6.2 cm = volume: 235 mL. Echogenicity within normal limits. No mass. Mild left hydronephrosis, grossly unchanged compared with CT. Bladder: Nonvisualized, bladder appears empty. Other: None. IMPRESSION: 1. Mild bilateral hydronephrosis, left greater than right. Hydronephrosis likely improved on the right and unchanged on the left as compared with CT from 06/03/2020. 2. Reported urinary tract stents are not demonstrated on today's ultrasound. Electronically Signed   By: Donavan Foil M.D.   On: 06/05/2020 16:14   DG CHEST PORT 1 VIEW  Result Date: 06/19/2020 CLINICAL DATA:  69 year old female with metastatic breast cancer. Hydronephrosis. Cough. EXAM: PORTABLE CHEST 1 VIEW COMPARISON:  Chest CT 09/18/2019 and earlier. FINDINGS: Portable AP semi upright view at 1800 hours. Stable right chest porta cath. Mildly lower lung volumes. Mediastinal contours remain normal. Allowing for portable technique the lungs are clear. No pneumothorax or pleural effusion. Stable surgical clips at the left axilla, left thoracic inlet. Prior cervical ACDF. Bulky deformity of the mid right clavicle is new since 2017. And there is around osteo lytic lesion of the right humeral head with adjacent  minimally displaced chronic fracture. Negative visible bowel gas pattern. IMPRESSION: 1. No acute cardiopulmonary abnormality. 2. Bone metastases and chronic fractures about the right shoulder. Electronically Signed   By: Genevie Ann M.D.   On: 06/19/2020 19:59   DG C-Arm 1-60 Min-No Report  Result Date: 06/07/2020 Fluoroscopy was utilized by the requesting physician.  No radiographic interpretation.   DG C-Arm 1-60 Min-No Report  Result Date: 06/03/2020 Fluoroscopy was utilized by the requesting physician.  No radiographic interpretation.   CT Renal Stone Study  Result Date: 06/17/2020 CLINICAL DATA:  Follow-up hydronephrosis. Worsening left flank pain. EXAM: CT ABDOMEN AND PELVIS WITHOUT CONTRAST  TECHNIQUE: Multidetector CT imaging of the abdomen and pelvis was performed following the standard protocol without IV contrast. COMPARISON:  06/06/2020 FINDINGS: Lower chest: Resolution of left effusion. Abnormal interstitial pulmonary density persists. Hepatobiliary: Normal without contrast. Pancreas: Normal Spleen: Normal Adrenals/Urinary Tract: Adrenal glands are normal. Bilateral ureteral stents remain in place, with the ends in the renal collecting system and bladder. Considerable reduction in perinephric edema previously seen on the left. Moderate fullness of the left renal collecting system does persist. Mild fullness of the right renal collecting system persists. Stomach/Bowel: Previous colostomy and distal colectomy. Soft tissue thickening in the presacral soft tissues appears unchanged. Vascular/Lymphatic: Aortic atherosclerosis. No aneurysm. IVC is normal. No retroperitoneal adenopathy is seen. Reproductive: Previous hysterectomy. Other: No free fluid or air. Musculoskeletal: Scattered osseous metastatic lesions are not significantly changed since the recent examinations. IMPRESSION: 1. Bilateral ureteral stents remain in place, with the ends in the renal collecting systems and bladder. Considerable  reduction in perinephric edema previously seen on the left. Moderate fullness of the left renal collecting system does persist. Mild fullness of the right renal collecting system persists. 2. Previous colostomy and distal colectomy. Soft tissue thickening in the presacral soft tissues appears unchanged. Scattered osseous metastatic lesions are not significantly changed. 3. Resolution of left effusion. Abnormal pulmonary interstitial pulmonary density persists. 4. Aortic atherosclerosis. Aortic Atherosclerosis (ICD10-I70.0). Electronically Signed   By: Nelson Chimes M.D.   On: 06/17/2020 15:09   CT RENAL STONE STUDY  Result Date: 06/03/2020 CLINICAL DATA:  Elevated creatinine, flank pain, no history of renal stones, history of left breast cancer EXAM: CT ABDOMEN AND PELVIS WITHOUT CONTRAST TECHNIQUE: Multidetector CT imaging of the abdomen and pelvis was performed following the standard protocol without IV contrast. COMPARISON:  PET-CT, 08/10/2019 FINDINGS: Lower chest: Small bilateral pleural effusions and associated atelectasis or consolidation. Interlobular septal thickening throughout the included lower lungs, in addition to bandlike scarring or atelectasis. Hepatobiliary: No solid liver abnormality is seen. No gallstones, gallbladder wall thickening, or biliary dilatation. Pancreas: Unremarkable. No pancreatic ductal dilatation or surrounding inflammatory changes. Spleen: Normal in size without significant abnormality. Adrenals/Urinary Tract: Adrenal glands are unremarkable. There is moderate right, mild left hydronephrosis and proximal hydroureter without visualized obstructing calculi or other obvious etiology. There is retroperitoneal fat stranding in the vicinity of the mid ureters (series 2, image 42) and calcifications which are of the iliac vessels and do not appear to be within the ureters. Bladder is unremarkable. Stomach/Bowel: Stomach is within normal limits. The appendix is not clearly  visualized. Status post abdominoperineal resection with left lower quadrant end colostomy. Vascular/Lymphatic: Aortic atherosclerosis. No enlarged abdominal or pelvic lymph nodes. Reproductive: The uterus is present although in an abnormally posterior location status post abdominoperineal resection. Other: No abdominal wall hernia or abnormality. No abdominopelvic ascites. Musculoskeletal: No acute or significant osseous findings. IMPRESSION: 1. There is moderate right, mild left hydronephrosis and proximal hydroureter without visualized obstructing calculi or other obvious etiology. There is retroperitoneal fat stranding in the vicinity of the mid ureters which is of uncertain nature. Contrast enhanced CT urogram may be helpful to better evaluate for obstructing etiology. 2. Status post abdominoperineal resection with left lower quadrant end colostomy. Appearance of the low pelvis is unchanged compared to prior PET-CT dated 08/10/2019 without evidence of mass or lymphadenopathy. 3. Small bilateral pleural effusions and associated atelectasis or consolidation. Interlobular septal thickening throughout the included lower lungs, in addition to bandlike scarring or atelectasis. Findings are most consistent with pulmonary edema. 4. Aortic  Atherosclerosis (ICD10-I70.0). Electronically Signed   By: Eddie Candle M.D.   On: 06/03/2020 11:31   IR NEPHROSTOMY PLACEMENT LEFT  Result Date: 06/18/2020 CLINICAL DATA:  History of metastatic breast carcinoma, bilateral hydronephrosis, bilateral ureteral stent placement and worsening renal function with acute left flank pain. Due to worsening hydronephrosis and acute left flank pain, decision has been made to place a left percutaneous nephrostomy tube at this time. EXAM: 1. ULTRASOUND GUIDANCE FOR PUNCTURE OF THE LEFT RENAL COLLECTING SYSTEM. 2. LEFT PERCUTANEOUS NEPHROSTOMY TUBE PLACEMENT. COMPARISON:  CT of the abdomen and pelvis as well as renal ultrasound earlier today.  ANESTHESIA/SEDATION: 3.0 mg IV Versed; 100 mcg IV Fentanyl. Total Moderate Sedation Time 18 minutes. Moderate (conscious) sedation was employed during this procedure. A total of Versed 3.0 mg and Fentanyl 100 mcg was administered intravenously. The patient's level of consciousness and vital signs were monitored continuously by radiology nursing throughout the procedure under my direct supervision. CONTRAST:  12 ml Omnipaque 300 MEDICATIONS: 2 g IV Ancef. Antibiotic was administered in an appropriate time frame prior to skin puncture. FLUOROSCOPY TIME:  1 minutes and 18 seconds.  8.0 mGy. PROCEDURE: The procedure, risks, benefits, and alternatives were explained to the patient. Questions regarding the procedure were encouraged and answered. The patient understands and consents to the procedure. A time-out was performed prior to initiating the procedure. Ultrasound was utilized to localize the left kidney. The left flank region was prepped with chlorhexidine in a sterile fashion, and a sterile drape was applied covering the operative field. A sterile gown and sterile gloves were used for the procedure. Local anesthesia was provided with 1% Lidocaine. Under direct ultrasound guidance, a 21 gauge needle was advanced into the renal collecting system. Ultrasound image documentation was performed. Aspiration of urine sample was performed followed by contrast injection. A transitional dilator was advanced over a guidewire. Percutaneous tract dilatation was then performed over the guidewire. A 10-French percutaneous nephrostomy tube was then advanced and formed in the collecting system. Catheter position was confirmed by fluoroscopy after contrast injection. The catheter was secured at the skin with a Prolene retention suture and Stat-Lock device. A gravity bag was placed. COMPLICATIONS: None. FINDINGS: Ultrasound demonstrates moderate hydronephrosis. Via interpolar caliceal access, a 10 French nephrostomy tube was advanced  and formed in the renal pelvis. There is good return of urine from the nephrostomy tube after placement. IMPRESSION: Placement of 10 French left percutaneous nephrostomy tube via interpolar caliceal access. The tube was formed in the renal pelvis and connected to gravity bag drainage. Electronically Signed   By: Aletta Edouard M.D.   On: 06/18/2020 08:37    ELIGIBLE FOR AVAILABLE RESEARCH PROTOCOL: ***  ASSESSMENT: 69 y.o.  [place] woman with stage IV BRCA2 positive breast cancer, as follows:  (1) left axillary nodal mass biopsy 04/09/2008 shows breast cancer, estrogen receptor 100% positive, progesterone receptor 5% positive, HER-2/neu amplified,  (a) staging studies showed stage IIIc disease  (2) received epirubicin/cyclophosphamide x4 dose dense followed by docetaxel x4 between 04/16/2008 and 08/13/2018  (a) trastuzumab started with docetaxel and continued for 52 weeks  (3) left mastectomy 08/30/2008 showed persistent disease in the breast and lymph nodes.  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  (8) carboplatin and paclitaxel x6 cycles with good response (06/25/2014 through 10/09/2014)  (9) palbociclib and Faslodex 11/13/2014, discontinued 06/10/2015 with progression   (a) right cervical lymph node biopsy BRCA2 positive  (10) exemestane and everolimus 07/02/2015 through 08/01/2015  (a) possible pneumonitis from everolimus  (11) denosumab/Xgeva started December 2016  (12) cisplatin (40 mg/m every other week)  08/23/2015 through 12/23/2015 discontinued due to patient's desire for drug holiday  (13) olaparib 08/26/2016 through 09/08/2016--poorly tolerated  (a) ANAL CANCER resected 09/01/2016  (14) Megace 10/12/2016 through 05/11/2017, discontinued with progression  (a) repeat left posterior cervical chain node biopsy again estrogen receptor positive now progesterone receptor as well as HER-2 negative  (15) eribulin 06/01/2017 through 11/23/2017  (16) toremifene 02/11/2018 through 10/05/2018, discontinued with progression  (17) Doxil 10/18/2018 through 04/03/2019  (18) pembrolizumab 08/21/2019 through 10/22/2019  (18) carboplatin and paclitaxel 10/24/2019 through 02/23/2020  (19) to start talazoparib 07/01/2020   PLAN: I spent approximately 60 minutes face to face with [name] with more than 50% of that time spent in counseling and coordination of care. Specifically we reviewed the biology of the patient's diagnosis and the specifics of her situation.      Chenita has a good understanding of the overall plan. She agrees with it. She knows the goal of treatment in her case is cure. She will call with any problems that may develop before her next visit here.  Chauncey Cruel, MD   06/24/2020 8:11 AM Medical Oncology and Hematology [facility] [address] [place], [place] [address] Tel. [phone]    Fax. [phone]

## 2020-06-24 NOTE — Telephone Encounter (Signed)
Oral Oncology Pharmacist Encounter   Prior Authorization for [name] (talazoparib) has been approved.     PA# [id number] Effective dates: 06/24/2020 through 06/24/2021  Cost: $2343.57   Oral Oncology Clinic will continue to follow.   Leron Croak, PharmD, BCPS Hematology/Oncology Clinical Pharmacist [facility] [phone] 06/24/2020 2:33 PM

## 2020-06-25 ENCOUNTER — Telehealth: Payer: Self-pay | Admitting: Oncology

## 2020-06-25 ENCOUNTER — Telehealth: Payer: Self-pay

## 2020-06-25 NOTE — Telephone Encounter (Signed)
Scheduled appt per 11/15 sch msg - pt is aware of appt date and time  ° °

## 2020-06-25 NOTE — Telephone Encounter (Signed)
Christy from [facility] called asking if we can hold orders for IVF as pt was able to intake 64 oz PO fluids and total output from nephrostomy 06/24/20 at 3 L. Alyse Low is also looking for nephrostomy dsg change orders. This LLPN attempted to call Christy at number provided [phone] but Alyse Low did not answer. LVM for Christy to return call to inform her per Dr Jana Hakim, [place] to make IVF PRN and nephrostomy dsg change OK.

## 2020-06-26 ENCOUNTER — Other Ambulatory Visit (HOSPITAL_COMMUNITY): Payer: Self-pay | Admitting: Interventional Radiology

## 2020-06-26 ENCOUNTER — Ambulatory Visit (HOSPITAL_COMMUNITY)
Admission: RE | Admit: 2020-06-26 | Discharge: 2020-06-26 | Disposition: A | Discharge status: Home / Self Care | Payer: Medicare Other | Source: Ambulatory Visit | Attending: Radiology | Admitting: Radiology

## 2020-06-26 ENCOUNTER — Other Ambulatory Visit (HOSPITAL_COMMUNITY): Payer: Self-pay | Admitting: Radiology

## 2020-06-26 ENCOUNTER — Other Ambulatory Visit: Payer: Self-pay

## 2020-06-26 DIAGNOSIS — N1330 Unspecified hydronephrosis: Secondary | ICD-10-CM

## 2020-06-26 DIAGNOSIS — Z436 Encounter for attention to other artificial openings of urinary tract: Secondary | ICD-10-CM | POA: Insufficient documentation

## 2020-06-26 DIAGNOSIS — N200 Calculus of kidney: Secondary | ICD-10-CM

## 2020-06-26 DIAGNOSIS — N135 Crossing vessel and stricture of ureter without hydronephrosis: Secondary | ICD-10-CM | POA: Diagnosis not present

## 2020-06-26 HISTORY — PX: IR NEPHROSTOMY EXCHANGE LEFT: IMG6069

## 2020-06-26 HISTORY — PX: IR TRANSCATH RETRIEVAL FB INCL GUIDANCE (MS): IMG5375

## 2020-06-26 MED ORDER — LIDOCAINE HCL 1 % IJ SOLN
INTRAMUSCULAR | Status: AC
  Filled 2020-06-26: qty 20

## 2020-06-26 MED ORDER — IOHEXOL 300 MG/ML  SOLN
50.0000 mL | Freq: Once | INTRAMUSCULAR | Status: AC | PRN
  Administered 2020-06-26: 30 mL

## 2020-06-26 MED ORDER — LIDOCAINE HCL 1 % IJ SOLN
INTRAMUSCULAR | Status: DC | PRN
  Administered 2020-06-26: 10 mL

## 2020-06-26 NOTE — Procedures (Signed)
Interventional Radiology Procedure Note  Hx: Patient arrives as outpatient add on without sedation.   Attempt at foreign body removal, targeting the left ureteral stent.    Also presents for exchange and upsize of left PCN, given drainage around the tube.   Procedure:   Patient unable to tolerate foreign body removal without sedation.  Requesting to stop and perform with sedation Exchange of the left PCN for 76O  Complications: None  Recommendations:  - Recommend removal of the left ureteral stent on the day of right stent exchange.  - Will schedule for routine exchange of the left PCN. Patient requesting with sedation.  - Do not submerge - Routine care   Signed,  Dulcy Fanny. Earleen Newport, DO

## 2020-06-27 ENCOUNTER — Other Ambulatory Visit (HOSPITAL_COMMUNITY): Payer: Self-pay | Admitting: Radiology

## 2020-06-27 ENCOUNTER — Encounter (HOSPITAL_COMMUNITY): Payer: Self-pay

## 2020-06-27 DIAGNOSIS — N179 Acute kidney failure, unspecified: Secondary | ICD-10-CM | POA: Diagnosis not present

## 2020-06-27 DIAGNOSIS — N131 Hydronephrosis with ureteral stricture, not elsewhere classified: Secondary | ICD-10-CM | POA: Diagnosis not present

## 2020-06-27 DIAGNOSIS — E860 Dehydration: Secondary | ICD-10-CM | POA: Diagnosis not present

## 2020-06-27 DIAGNOSIS — E440 Moderate protein-calorie malnutrition: Secondary | ICD-10-CM | POA: Diagnosis not present

## 2020-06-27 DIAGNOSIS — Z436 Encounter for attention to other artificial openings of urinary tract: Secondary | ICD-10-CM | POA: Diagnosis not present

## 2020-06-27 DIAGNOSIS — I129 Hypertensive chronic kidney disease with stage 1 through stage 4 chronic kidney disease, or unspecified chronic kidney disease: Secondary | ICD-10-CM | POA: Diagnosis not present

## 2020-06-27 DIAGNOSIS — N1330 Unspecified hydronephrosis: Secondary | ICD-10-CM

## 2020-06-27 NOTE — Telephone Encounter (Signed)
Oral Chemotherapy Pharmacist Encounter   Patient informed of cost forTalzenna (talazoparib). Copay is unaffordable for patient. Patient made aware of manufacturer assistance programs available for [place]. Patient stated she will come to [facility] to sign paperwork for [facility] when she is able to.   Oral Oncology Clinic will continue to follow.   Leron Croak, PharmD, BCPS Hematology/Oncology Clinical Pharmacist [facility] [phone] 06/27/2020 3:26 PM

## 2020-06-28 DIAGNOSIS — E875 Hyperkalemia: Secondary | ICD-10-CM | POA: Diagnosis not present

## 2020-06-28 DIAGNOSIS — E860 Dehydration: Secondary | ICD-10-CM | POA: Diagnosis not present

## 2020-06-28 DIAGNOSIS — R269 Unspecified abnormalities of gait and mobility: Secondary | ICD-10-CM | POA: Diagnosis not present

## 2020-06-28 DIAGNOSIS — Z1289 Encounter for screening for malignant neoplasm of other sites: Secondary | ICD-10-CM | POA: Diagnosis not present

## 2020-06-28 DIAGNOSIS — D539 Nutritional anemia, unspecified: Secondary | ICD-10-CM | POA: Diagnosis not present

## 2020-06-28 DIAGNOSIS — N1832 Chronic kidney disease, stage 3b: Secondary | ICD-10-CM | POA: Diagnosis not present

## 2020-06-28 DIAGNOSIS — Z6821 Body mass index (BMI) 21.0-21.9, adult: Secondary | ICD-10-CM | POA: Diagnosis not present

## 2020-06-28 DIAGNOSIS — C7951 Secondary malignant neoplasm of bone: Secondary | ICD-10-CM | POA: Diagnosis not present

## 2020-06-28 DIAGNOSIS — N179 Acute kidney failure, unspecified: Secondary | ICD-10-CM | POA: Diagnosis not present

## 2020-06-28 DIAGNOSIS — C50919 Malignant neoplasm of unspecified site of unspecified female breast: Secondary | ICD-10-CM | POA: Diagnosis not present

## 2020-06-28 DIAGNOSIS — E871 Hypo-osmolality and hyponatremia: Secondary | ICD-10-CM | POA: Diagnosis not present

## 2020-06-28 DIAGNOSIS — R944 Abnormal results of kidney function studies: Secondary | ICD-10-CM | POA: Diagnosis not present

## 2020-06-28 DIAGNOSIS — R7309 Other abnormal glucose: Secondary | ICD-10-CM | POA: Diagnosis not present

## 2020-06-28 DIAGNOSIS — Z85038 Personal history of other malignant neoplasm of large intestine: Secondary | ICD-10-CM | POA: Diagnosis not present

## 2020-06-28 DIAGNOSIS — I10 Essential (primary) hypertension: Secondary | ICD-10-CM | POA: Diagnosis not present

## 2020-06-28 DIAGNOSIS — K219 Gastro-esophageal reflux disease without esophagitis: Secondary | ICD-10-CM | POA: Diagnosis not present

## 2020-06-28 DIAGNOSIS — R0602 Shortness of breath: Secondary | ICD-10-CM | POA: Diagnosis not present

## 2020-06-28 DIAGNOSIS — R109 Unspecified abdominal pain: Secondary | ICD-10-CM | POA: Diagnosis not present

## 2020-06-28 DIAGNOSIS — Z7409 Other reduced mobility: Secondary | ICD-10-CM | POA: Diagnosis not present

## 2020-06-28 DIAGNOSIS — J918 Pleural effusion in other conditions classified elsewhere: Secondary | ICD-10-CM | POA: Diagnosis not present

## 2020-06-29 ENCOUNTER — Telehealth: Payer: Self-pay | Admitting: Student

## 2020-06-29 DIAGNOSIS — Z436 Encounter for attention to other artificial openings of urinary tract: Secondary | ICD-10-CM | POA: Diagnosis not present

## 2020-06-29 DIAGNOSIS — I129 Hypertensive chronic kidney disease with stage 1 through stage 4 chronic kidney disease, or unspecified chronic kidney disease: Secondary | ICD-10-CM | POA: Diagnosis not present

## 2020-06-29 DIAGNOSIS — N131 Hydronephrosis with ureteral stricture, not elsewhere classified: Secondary | ICD-10-CM | POA: Diagnosis not present

## 2020-06-29 DIAGNOSIS — E440 Moderate protein-calorie malnutrition: Secondary | ICD-10-CM | POA: Diagnosis not present

## 2020-06-29 DIAGNOSIS — N179 Acute kidney failure, unspecified: Secondary | ICD-10-CM | POA: Diagnosis not present

## 2020-06-29 DIAGNOSIS — E860 Dehydration: Secondary | ICD-10-CM | POA: Diagnosis not present

## 2020-06-29 NOTE — Telephone Encounter (Signed)
IR.  History of metastatic breast cancer complicated by bilateral hydronephrosis s/p bilateral ureteral stent placement by urology; further complicate by failure of left ureteral stent (left hydronephrosis, worsening renal function, left flank pain) s/p left PCN placement in IR 88/10/2547; further complicated by constant drainage at skin site s/p left PCN exchange and upsize in IR 06/26/2020.  Received VM from patient stating that she has had drainage at drain skin site- asking for call back regarding recommendations. Called patient [phone] at [phone] to discuss. Patient states that drain was functioning without issues, however she woke up last evening and her bed/dressing were wet from drainage at skin near drain insertion site. States that this has currently stopped. States she still has high output of drainage into gravity bag. Reassured patient that as long as draining into gravity bag there are no urgent/emergent needs at this time. Recommend patient manage leakage with dressing changes over the weekend- instructed patient to keep site as clean/dry as possible. Recommend patient come to [facility] on Monday 11/22 for drain evaluation- instructed patient that IR schedulers will call her Monday regarding time/instructions (message sent to IR schedulers to make aware). Instructed patient to call IR schedulers [phone], number given) if she does not hear from them by 1200 on Monday 11/22. Patient agrees, however requests that if she requires exchange/upsize of tube that she has sedation, as last exchange 11/17 was uncomfortable/painful for her (per notes retrieval of left ureteral stent stopped due to discomfort without sedation)- informed patient that hopefully will not require exchange, however if we do and she would like sedation instructed patient to be NPO (no food/drinks) starting at midnight prior to Monday. Dr. Anselm Pancoast aware of above. All questions answered and concerns addressed. Patient conveys  understanding and agrees with plan.  Please call IR with questions/concerns.   Bea Graff Leonel Mccollum, PA-C 06/29/2020, 9:28 AM

## 2020-07-01 ENCOUNTER — Other Ambulatory Visit (HOSPITAL_COMMUNITY): Payer: Self-pay | Admitting: Diagnostic Radiology

## 2020-07-01 ENCOUNTER — Telehealth: Payer: Self-pay

## 2020-07-01 ENCOUNTER — Other Ambulatory Visit (HOSPITAL_COMMUNITY): Payer: Self-pay | Admitting: Interventional Radiology

## 2020-07-01 DIAGNOSIS — N200 Calculus of kidney: Secondary | ICD-10-CM

## 2020-07-01 NOTE — Telephone Encounter (Signed)
Oral Oncology Patient Advocate Encounter  Met patient in [place] to complete application for [employer] in an effort to reduce patient's out of pocket expense for Talzenna to $0.    Application completed and faxed to [phone].   [employer] patient assistance phone number for follow up is [phone].   This encounter will be updated until final determination.   [place] Patient [place] Phone [phone] Fax [phone] 07/01/2020 1:02 PM

## 2020-07-02 ENCOUNTER — Ambulatory Visit
Admission: RE | Admit: 2020-07-02 | Discharge: 2020-07-02 | Disposition: A | Discharge status: Home / Self Care | Payer: Medicare Other | Source: Ambulatory Visit | Attending: Interventional Radiology | Admitting: Interventional Radiology

## 2020-07-02 ENCOUNTER — Encounter: Payer: Self-pay | Admitting: Radiology

## 2020-07-02 DIAGNOSIS — E440 Moderate protein-calorie malnutrition: Secondary | ICD-10-CM | POA: Diagnosis not present

## 2020-07-02 DIAGNOSIS — E860 Dehydration: Secondary | ICD-10-CM | POA: Diagnosis not present

## 2020-07-02 DIAGNOSIS — N200 Calculus of kidney: Secondary | ICD-10-CM

## 2020-07-02 DIAGNOSIS — Z436 Encounter for attention to other artificial openings of urinary tract: Secondary | ICD-10-CM | POA: Diagnosis not present

## 2020-07-02 DIAGNOSIS — T85638A Leakage of other specified internal prosthetic devices, implants and grafts, initial encounter: Secondary | ICD-10-CM | POA: Diagnosis not present

## 2020-07-02 DIAGNOSIS — N179 Acute kidney failure, unspecified: Secondary | ICD-10-CM | POA: Diagnosis not present

## 2020-07-02 DIAGNOSIS — I129 Hypertensive chronic kidney disease with stage 1 through stage 4 chronic kidney disease, or unspecified chronic kidney disease: Secondary | ICD-10-CM | POA: Diagnosis not present

## 2020-07-02 DIAGNOSIS — N131 Hydronephrosis with ureteral stricture, not elsewhere classified: Secondary | ICD-10-CM | POA: Diagnosis not present

## 2020-07-02 HISTORY — PX: IR RADIOLOGIST EVAL & MGMT: IMG5224

## 2020-07-02 NOTE — Progress Notes (Signed)
Patient ID: [name], female   DOB: [birth year], 69 y.o.   MRN: [id number]         Chief Complaint: Leaking left-sided nephrostomy catheter  Referring Physician(s): Louis Meckel  History of Present Illness: [name] is a 69 y.o. 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  Patient subsequent underwent fluoroscopic guided exchange and upsizing to 12 [place] on 06/26/2020.  Additionally, attempts were made to retrieve the left-sided ureteral stent however this ultimately proved unsuccessful in large part due to patient's inability to tolerate the procedure as it was attempted without conscious sedation.  Patient states that this past Saturday (11/20), she awoke with extensive pericatheter leakage however states since that time there has been no leakage associated with the nephrostomy catheter.  She is otherwise without catheter related complaint.  Specifically, no recurrent flank pain.    Past Medical History:  Diagnosis Date  . Adenocarcinoma of breast ([place]) 01/27/2011   stage IIIc breast cancer  . Bladder infection   . Bone metastasis ([place]) 01/17/2016  . Borderline hypertension   . Breast cancer ([place])   . Closed fracture of humerus sept 2013  . Depression   . Family history of adverse reaction to anesthesia    father remembered his surgery  . Fatigue   . GERD (gastroesophageal reflux disease)   . Hypertension   . 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 Osteoporosis 01/27/2011  . Pneumonia   . Port catheter in place 04/14/2013  . Post-mastectomy lymphedema syndrome   . 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 Reflux   .  Retinal detachment   . 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 Squamous cell carcinoma of rectum ([place]) 01/17/2016  . Swollen lymph nodes   . URI (upper respiratory infection) 02/17/12  . Wears glasses     Past Surgical History:  Procedure Laterality Date  . ABDOMINAL PERINEAL BOWEL RESECTION N/A 09/01/2016   Procedure: ABDOMINAL PERINEAL RESECTION PERMANENT COLOSTOMY;  Surgeon: Leighton Ruff, MD;  Location: [facility];  Service: General;  Laterality: N/A;  . biopsy of lymph node     super clavicle  . BTL    . CATARACT EXTRACTION    . cervical disc fusioni    . CYSTOSCOPY W/ URETERAL STENT PLACEMENT Bilateral 06/03/2020   Procedure: CYSTOSCOPY WITH RETROGRADE PYELOGRAM/URETERAL STENT PLACEMENT bladder biopsy;  Surgeon: [name], MD;  Location: [facility];  Service: Urology;  Laterality: Bilateral;  . CYSTOSCOPY WITH STENT PLACEMENT Left 06/07/2020   Procedure: CYSTOSCOPY LEFT STENT EXCHANGE, retrograde pylogram;  Surgeon: [name], MD;  Location: [facility];  Service: Urology;  Laterality: Left;  . IR NEPHROSTOMY EXCHANGE LEFT  06/26/2020  . IR NEPHROSTOMY PLACEMENT LEFT  06/17/2020  . IR RADIOLOGIST EVAL & MGMT  07/02/2020  . IR TRANSCATH RETRIEVAL FB INCL GUIDANCE (MS)  06/26/2020  . LYMPH NODE BIOPSY Right 06/19/2015   Procedure: RIGHT NECK LYMPH NODE EXCISION;  Surgeon: Rolm Bookbinder, MD;  Location: [facility];  Service: General;  Laterality: Right;  . LYMPH NODE DISSECTION Left 04/26/2017   Procedure: EXCISION LEFT NECK LYMPH NODE;  Surgeon: Rolm Bookbinder, MD;  Location: [facility];  Service: General;  Laterality:  Left;  . MASS EXCISION Left 07/27/2016   Procedure: EXCISION OF POSTERIOR NECK MASS;  Surgeon: Rolm Bookbinder, MD;  Location: [facility];  Service: General;  Laterality: Left;  Marland Kitchen MASTECTOMY MODIFIED RADICAL     left breast  . Port- a-cath insertion    . retinal detachment and repair      Allergies: Ciprofloxacin and  Prochlorperazine  Medications: Prior to Admission medications   Medication Sig Start Date End Date Taking? Authorizing Provider  acetaminophen (TYLENOL) 500 MG tablet Take 1,000 mg by mouth every 6 (six) hours as needed for moderate pain or headache.    [provider]  buPROPion (WELLBUTRIN SR) 100 MG 12 hr tablet Take 100 mg by mouth 2 (two) times daily.    [provider]  CALCIUM-VITAMIN D PO Take 1 tablet by mouth 2 (two) times daily.     [provider]  denosumab (XGEVA) 120 MG/1.7ML SOLN injection Inject 120 mg into the skin every 3 (three) months.    [provider]  docusate sodium (COLACE) 100 MG capsule Take 1 capsule (100 mg total) by mouth daily. 06/22/20   Kayleen Memos, DO  metoprolol succinate (TOPROL-XL) 25 MG 24 hr tablet Take 25 mg by mouth in the morning and at bedtime.  04/23/16   [provider]  Multiple Vitamin (MULTIVITAMIN WITH MINERALS) TABS tablet Take 1 tablet by mouth daily. 06/22/20   Kayleen Memos, DO  omeprazole (PRILOSEC) 40 MG capsule Take 1 capsule (40 mg total) by mouth 2 (two) times daily. Patient taking differently: Take 40 mg by mouth in the morning and at bedtime.  03/19/16   Holley Bouche, NP  polyethylene glycol (MIRALAX / GLYCOLAX) 17 g packet Take 17 g by mouth daily. 06/22/20   Kayleen Memos, DO  potassium chloride (KLOR-CON) 10 MEQ tablet Take 20 mEq by mouth 2 (two) times daily.    [provider]  prochlorperazine (COMPAZINE) 5 MG tablet Take 1 tablet (5 mg total) by mouth 4 (four) times daily -  before meals and at bedtime. 06/21/20   Kayleen Memos, DO  sodium chloride 0.9 % infusion Inject 1,000 mLs into the vein daily. 06/22/20   Kayleen Memos, DO  talazoparib tosylate (TALZENNA) 0.25 MG capsule Take 2 capsules (0.5 mg total) by mouth daily. To start 07/01/2020 06/24/20   Magrinat, Virgie Dad, MD  tamsulosin (FLOMAX) 0.4 MG CAPS capsule Take 1 capsule (0.4 mg total) by mouth 2 (two)  times daily. 06/08/20 07/08/20  Caren Griffins, MD  vitamin B-12 (CYANOCOBALAMIN) 1000 MCG tablet Take 1,000 mcg by mouth 2 (two) times daily.     [provider]     Family History  Problem Relation Age of Onset  . Hypertension Mother   . Heart disease Mother        atrial fib  . Heart disease Father   . Other Father        glaucoma    Social History   Socioeconomic History  . Marital status: Divorced    Spouse name: Not on file  . Number of children: Not on file  . Years of education: Not on file  . Highest education level: Not on file  Occupational History  . Not on file  Tobacco Use  . Smoking status: Never Smoker  . Smokeless tobacco: Never Used  Vaping Use  . Vaping Use: Never used  Substance and Sexual Activity  . Alcohol use: Yes  Alcohol/week: 7.0 standard drinks    Types: 7 Glasses of wine per week    Comment: 1 drink daily  . Drug use: Yes    Types: Marijuana    Comment: During chemo, none over 1 Year  . Sexual activity: Never  Other Topics Concern  . Not on file  Social History Narrative  . Not on file   Social Determinants of Health   Financial Resource Strain:   . Difficulty of Paying Living Expenses: Not on file  Food Insecurity:   . Worried About Charity fundraiser in the Last Year: Not on file  . Ran Out of Food in the Last Year: Not on file  Transportation Needs:   . Lack of Transportation (Medical): Not on file  . Lack of Transportation (Non-Medical): Not on file  Physical Activity:   . Days of Exercise per Week: Not on file  . Minutes of Exercise per Session: Not on file  Stress:   . Feeling of Stress : Not on file  Social Connections:   . Frequency of Communication with Friends and Family: Not on file  . Frequency of Social Gatherings with Friends and Family: Not on file  . Attends Religious Services: Not on file  . Active Member of Clubs or Organizations: Not on file  . Attends Archivist Meetings: Not on  file  . Marital Status: Not on file    ECOG Status: 1 - Symptomatic but completely ambulatory  Review of Systems: A 12 point ROS discussed and pertinent positives are indicated in the HPI above.  All other systems are negative.  Review of Systems  Vital Signs: BP (!) 153/63   Pulse 73   Temp 97.7 F (36.5 C)   SpO2 99%   Physical Exam  Mallampati Score:     Imaging: CT ABDOMEN PELVIS WO CONTRAST  Result Date: 06/06/2020 CLINICAL DATA:  Ureteral stent placement. Metastatic breast cancer. Left flank pain EXAM: CT ABDOMEN AND PELVIS WITHOUT CONTRAST TECHNIQUE: Multidetector CT imaging of the abdomen and pelvis was performed following the standard protocol without IV contrast. COMPARISON:  CT dated 06/03/2020 FINDINGS: Lower chest: There is a moderate to large partially visualized left-sided pleural effusion which has increased in size since the prior study.The heart size appears grossly normal. The previously demonstrated right-sided pleural effusion has improved. There are areas of atelectasis at the lung bases bilaterally with scattered interlobular septal thickening. Hepatobiliary: The liver is normal. There is layering hyperdense material in the gallbladder lumen, new since prior study. This may represent vicarious excretion of contrast versus less likely gallbladder sludge.There is no biliary ductal dilation. Pancreas: Normal contours without ductal dilatation. No peripancreatic fluid collection. Spleen: Unremarkable. Adrenals/Urinary Tract: --Adrenal glands: Unremarkable. --Right kidney/ureter: The patient has undergone right-sided double-J ureteral stent placement. The degree of hydronephrosis is largely unchanged from prior study. --Left kidney/ureter: The patient has undergone left-sided ureteral stent placement. The stent appears well positioned. The degree of hydronephrosis is largely unchanged despite stent placement. There is extensive increased perinephric fat stranding about  the left kidney with new perinephric/retroperitoneal free fluid. --Urinary bladder: The bladder is decompressed by Foley catheter. Stomach/Bowel: --Stomach/Duodenum: No hiatal hernia or other gastric abnormality. Normal duodenal course and caliber. --Small bowel: Unremarkable. --Colon: Patient is status post placement of an end colostomy in the left lower quadrant. There is no evidence for an obstruction. --Appendix: Normal. Vascular/Lymphatic: Atherosclerotic calcification is present within the non-aneurysmal abdominal aorta, without hemodynamically significant stenosis. --No retroperitoneal lymphadenopathy. --No mesenteric  lymphadenopathy. --No pelvic or inguinal lymphadenopathy. Reproductive: Status post hysterectomy. No adnexal mass. Other: No ascites or free air. There is relatively unchanged presacral soft tissue. Musculoskeletal. Metastatic osseous lesions are noted in the lumbar spine. Osseous metastatic disease is noted to the ribs. IMPRESSION: 1. Largely unchanged bilateral hydroureteronephrosis despite well placed double-J ureteral stents. On the left, there is significant interval increase in perinephric fat stranding and retroperitoneal free fluid. Findings may be secondary to recent instrumentation versus a forniceal rupture. There is no well organized fluid collection to suggest a developing abscess, however evaluation is limited by lack of IV contrast. 2. Growing left-sided moderate to large pleural effusion. Significant interval decrease in the previously demonstrated right-sided pleural effusion. 3. Additional chronic stable findings are noted as detailed above. 4.  Aortic Atherosclerosis (ICD10-I70.0). Electronically Signed   By: Constance Holster M.D.   On: 06/06/2020 15:29   DG Abdomen 1 View  Result Date: 06/17/2020 CLINICAL DATA:  Abdominal pain EXAM: ABDOMEN - 1 VIEW COMPARISON:  CT abdomen and pelvis June 06, 2020. FINDINGS: Double-J stents are present bilaterally, stable. Ostomy  left mid abdomen, unchanged. No abnormal calcifications demonstrable. No bowel dilatation or air-fluid level to suggest bowel obstruction. No free air on supine examination. IMPRESSION: Stable appearing double-J stents, unchanged in positions. Ostomy left mid abdomen. No abnormal calcification. No bowel obstruction or free air evident. Electronically Signed   By: Lowella Grip III M.D.   On: 06/17/2020 13:42   US Renal  Result Date: 06/17/2020 CLINICAL DATA:  Progressive flank region pain EXAM: RENAL / URINARY TRACT ULTRASOUND COMPLETE COMPARISON:  Renal ultrasound June 05, 2020; CT abdomen and pelvis June 06, 2020 FINDINGS: Right Kidney: Renal measurements: 11.0 x 6.0 x 7.0 cm = volume: 267 mL. Echogenicity and renal cortical thickness are within normal limits. No mass or perinephric fluid visualized. Scarring lateral right kidney noted. There is moderate hydronephrosis on the right. A stent is present extending from the right renal pelvis extending to the bladder. Left Kidney: Renal measurements: 11.5 x 6.5 x 5.5 cm = volume: 215 mL. Echogenicity and renal cortical thickness are within normal limits. No mass or perinephric fluid visualized. Moderate hydronephrosis. Double-J stent extending from left renal pelvis to the bladder level. Bladder: Double-J stents in bladder.  Bladder virtually empty. Other: None. IMPRESSION: Double-J stents present bilaterally. Despite presence of stents, moderate hydronephrosis is present on each side. Scarring lateral right kidney. Study otherwise unremarkable. Electronically Signed   By: Lowella Grip III M.D.   On: 06/17/2020 13:47   US RENAL  Result Date: 06/05/2020 CLINICAL DATA:  Left-sided flank pain status post stent placement EXAM: RENAL / URINARY TRACT ULTRASOUND COMPLETE COMPARISON:  CT 06/03/2020 FINDINGS: Right Kidney: Renal measurements: 10.2 x 4.9 x 6 cm = volume: 157 mL. Echogenicity within normal limits. Mild right hydronephrosis, likely  decreased as compared with prior CT. No mass. Left Kidney: Renal measurements: 11.5 x 6.3 x 6.2 cm = volume: 235 mL. Echogenicity within normal limits. No mass. Mild left hydronephrosis, grossly unchanged compared with CT. Bladder: Nonvisualized, bladder appears empty. Other: None. IMPRESSION: 1. Mild bilateral hydronephrosis, left greater than right. Hydronephrosis likely improved on the right and unchanged on the left as compared with CT from 06/03/2020. 2. Reported urinary tract stents are not demonstrated on today's ultrasound. Electronically Signed   By: Donavan Foil M.D.   On: 06/05/2020 16:14   DG CHEST PORT 1 VIEW  Result Date: 06/19/2020 CLINICAL DATA:  69 year old female with metastatic breast cancer.  Hydronephrosis. Cough. EXAM: PORTABLE CHEST 1 VIEW COMPARISON:  Chest CT 09/18/2019 and earlier. FINDINGS: Portable AP semi upright view at 1800 hours. Stable right chest porta cath. Mildly lower lung volumes. Mediastinal contours remain normal. Allowing for portable technique the lungs are clear. No pneumothorax or pleural effusion. Stable surgical clips at the left axilla, left thoracic inlet. Prior cervical ACDF. Bulky deformity of the mid right clavicle is new since 2017. And there is around osteo lytic lesion of the right humeral head with adjacent minimally displaced chronic fracture. Negative visible bowel gas pattern. IMPRESSION: 1. No acute cardiopulmonary abnormality. 2. Bone metastases and chronic fractures about the right shoulder. Electronically Signed   By: Genevie Ann M.D.   On: 06/19/2020 19:59   DG C-Arm 1-60 Min-No Report  Result Date: 06/07/2020 Fluoroscopy was utilized by the requesting physician.  No radiographic interpretation.   DG C-Arm 1-60 Min-No Report  Result Date: 06/03/2020 Fluoroscopy was utilized by the requesting physician.  No radiographic interpretation.   CT Renal Stone Study  Result Date: 06/17/2020 CLINICAL DATA:  Follow-up hydronephrosis. Worsening left  flank pain. EXAM: CT ABDOMEN AND PELVIS WITHOUT CONTRAST TECHNIQUE: Multidetector CT imaging of the abdomen and pelvis was performed following the standard protocol without IV contrast. COMPARISON:  06/06/2020 FINDINGS: Lower chest: Resolution of left effusion. Abnormal interstitial pulmonary density persists. Hepatobiliary: Normal without contrast. Pancreas: Normal Spleen: Normal Adrenals/Urinary Tract: Adrenal glands are normal. Bilateral ureteral stents remain in place, with the ends in the renal collecting system and bladder. Considerable reduction in perinephric edema previously seen on the left. Moderate fullness of the left renal collecting system does persist. Mild fullness of the right renal collecting system persists. Stomach/Bowel: Previous colostomy and distal colectomy. Soft tissue thickening in the presacral soft tissues appears unchanged. Vascular/Lymphatic: Aortic atherosclerosis. No aneurysm. IVC is normal. No retroperitoneal adenopathy is seen. Reproductive: Previous hysterectomy. Other: No free fluid or air. Musculoskeletal: Scattered osseous metastatic lesions are not significantly changed since the recent examinations. IMPRESSION: 1. Bilateral ureteral stents remain in place, with the ends in the renal collecting systems and bladder. Considerable reduction in perinephric edema previously seen on the left. Moderate fullness of the left renal collecting system does persist. Mild fullness of the right renal collecting system persists. 2. Previous colostomy and distal colectomy. Soft tissue thickening in the presacral soft tissues appears unchanged. Scattered osseous metastatic lesions are not significantly changed. 3. Resolution of left effusion. Abnormal pulmonary interstitial pulmonary density persists. 4. Aortic atherosclerosis. Aortic Atherosclerosis (ICD10-I70.0). Electronically Signed   By: Nelson Chimes M.D.   On: 06/17/2020 15:09   CT RENAL STONE STUDY  Result Date: 06/03/2020 CLINICAL  DATA:  Elevated creatinine, flank pain, no history of renal stones, history of left breast cancer EXAM: CT ABDOMEN AND PELVIS WITHOUT CONTRAST TECHNIQUE: Multidetector CT imaging of the abdomen and pelvis was performed following the standard protocol without IV contrast. COMPARISON:  PET-CT, 08/10/2019 FINDINGS: Lower chest: Small bilateral pleural effusions and associated atelectasis or consolidation. Interlobular septal thickening throughout the included lower lungs, in addition to bandlike scarring or atelectasis. Hepatobiliary: No solid liver abnormality is seen. No gallstones, gallbladder wall thickening, or biliary dilatation. Pancreas: Unremarkable. No pancreatic ductal dilatation or surrounding inflammatory changes. Spleen: Normal in size without significant abnormality. Adrenals/Urinary Tract: Adrenal glands are unremarkable. There is moderate right, mild left hydronephrosis and proximal hydroureter without visualized obstructing calculi or other obvious etiology. There is retroperitoneal fat stranding in the vicinity of the mid ureters (series 2, image 42) and calcifications  which are of the iliac vessels and do not appear to be within the ureters. Bladder is unremarkable. Stomach/Bowel: Stomach is within normal limits. The appendix is not clearly visualized. Status post abdominoperineal resection with left lower quadrant end colostomy. Vascular/Lymphatic: Aortic atherosclerosis. No enlarged abdominal or pelvic lymph nodes. Reproductive: The uterus is present although in an abnormally posterior location status post abdominoperineal resection. Other: No abdominal wall hernia or abnormality. No abdominopelvic ascites. Musculoskeletal: No acute or significant osseous findings. IMPRESSION: 1. There is moderate right, mild left hydronephrosis and proximal hydroureter without visualized obstructing calculi or other obvious etiology. There is retroperitoneal fat stranding in the vicinity of the mid ureters which  is of uncertain nature. Contrast enhanced CT urogram may be helpful to better evaluate for obstructing etiology. 2. Status post abdominoperineal resection with left lower quadrant end colostomy. Appearance of the low pelvis is unchanged compared to prior PET-CT dated 08/10/2019 without evidence of mass or lymphadenopathy. 3. Small bilateral pleural effusions and associated atelectasis or consolidation. Interlobular septal thickening throughout the included lower lungs, in addition to bandlike scarring or atelectasis. Findings are most consistent with pulmonary edema. 4. Aortic Atherosclerosis (ICD10-I70.0). Electronically Signed   By: Eddie Candle M.D.   On: 06/03/2020 11:31   IR NEPHROSTOMY PLACEMENT LEFT  Result Date: 06/18/2020 CLINICAL DATA:  History of metastatic breast carcinoma, bilateral hydronephrosis, bilateral ureteral stent placement and worsening renal function with acute left flank pain. Due to worsening hydronephrosis and acute left flank pain, decision has been made to place a left percutaneous nephrostomy tube at this time. EXAM: 1. ULTRASOUND GUIDANCE FOR PUNCTURE OF THE LEFT RENAL COLLECTING SYSTEM. 2. LEFT PERCUTANEOUS NEPHROSTOMY TUBE PLACEMENT. COMPARISON:  CT of the abdomen and pelvis as well as renal ultrasound earlier today. ANESTHESIA/SEDATION: 3.0 mg IV Versed; 100 mcg IV Fentanyl. Total Moderate Sedation Time 18 minutes. Moderate (conscious) sedation was employed during this procedure. A total of Versed 3.0 mg and Fentanyl 100 mcg was administered intravenously. The patient's level of consciousness and vital signs were monitored continuously by radiology nursing throughout the procedure under my direct supervision. CONTRAST:  12 ml Omnipaque 300 MEDICATIONS: 2 g IV Ancef. Antibiotic was administered in an appropriate time frame prior to skin puncture. FLUOROSCOPY TIME:  1 minutes and 18 seconds.  8.0 mGy. PROCEDURE: The procedure, risks, benefits, and alternatives were explained to  the patient. Questions regarding the procedure were encouraged and answered. The patient understands and consents to the procedure. A time-out was performed prior to initiating the procedure. Ultrasound was utilized to localize the left kidney. The left flank region was prepped with chlorhexidine in a sterile fashion, and a sterile drape was applied covering the operative field. A sterile gown and sterile gloves were used for the procedure. Local anesthesia was provided with 1% Lidocaine. Under direct ultrasound guidance, a 21 gauge needle was advanced into the renal collecting system. Ultrasound image documentation was performed. Aspiration of urine sample was performed followed by contrast injection. A transitional dilator was advanced over a guidewire. Percutaneous tract dilatation was then performed over the guidewire. A 10-French percutaneous nephrostomy tube was then advanced and formed in the collecting system. Catheter position was confirmed by fluoroscopy after contrast injection. The catheter was secured at the skin with a Prolene retention suture and Stat-Lock device. A gravity bag was placed. COMPLICATIONS: None. FINDINGS: Ultrasound demonstrates moderate hydronephrosis. Via interpolar caliceal access, a 10 French nephrostomy tube was advanced and formed in the renal pelvis. There is good return of urine from  the nephrostomy tube after placement. IMPRESSION: Placement of 10 French left percutaneous nephrostomy tube via interpolar caliceal access. The tube was formed in the renal pelvis and connected to gravity bag drainage. Electronically Signed   By: Aletta Edouard M.D.   On: 06/18/2020 08:37   IR NEPHROSTOMY EXCHANGE LEFT  Result Date: 06/26/2020 INDICATION: 69 year old female with a history of ureteral obstruction and bilateral indwelling ureteral stents. Failure of the left ureteral stent has necessitated the need for left PCN, performed 06/17/2020. The patient has had constant drainage at the  skin site and presents for exchange possible up sizing, and possible foreign body retrieval of the nonfunctional left ureteral stent. EXAM: IR EXCHANGE NEPHROSTOMY LEFT COMPARISON:  None. MEDICATIONS: None. ANESTHESIA/SEDATION: None CONTRAST:  102mL OMNIPAQUE IOHEXOL 300 MG/ML SOLN - administered into the collecting system(s) FLUOROSCOPY TIME:  Fluoroscopy Time: 10 minutes 42 seconds (129 mGy). COMPLICATIONS: None PROCEDURE: Informed written consent was obtained from the patient after a thorough discussion of the procedural risks, benefits and alternatives. All questions were addressed. Maximal Sterile Barrier Technique was utilized including caps, mask, sterile gowns, sterile gloves, sterile drape, hand hygiene and skin antiseptic. A timeout was performed prior to the initiation of the procedure. Patient was position prone and the left flank and indwelling PCN were prepped and draped in the usual sterile fashion. Contrast was injected with images stored and sent to PACs. 1% lidocaine was used for local anesthesia. The suture was ligated. Bentson wire was passed into the collecting system and the 10 [place] drain was removed. Eight French sheath was then placed into the collecting system. We initially passed and ensnare, although the ensnare would not manipulate near the tip of the double-J stent, which was caught in a lower pole calyx/infundibulum. The ensnare was removed and then we attempted to manipulate the double-J stent proximally with the use of a omni flush catheter. The Omni Flush catheter was used with torquing the catheter to withdraw into the collecting system, also with the use of a Bentson wire in attempt to lasso the double-J stent. Ultimately this failed, and during these attempts the patient asked [place] to stop trying, as she was quite uncomfortable during this manipulation. This had somewhat to do with the small collecting system in the renal pelvis as this was decompressed by the indwelling PCN. We  then proceeded with a routine exchange of the sheath and placement of a new 12 French PCN on the Bentson wire which was formed in the collecting system. Catheter was sutured in position and attached to gravity drainage. No complications encountered and no significant blood loss. IMPRESSION: Status post routine exchange of a right PCN for up sizing to a 12 [place] drain. Attempt was made to perform foreign body retrieval of the double-J stent, however, given that the proximal loop of the stent is partially un formed with the tip varied in the lower pole collecting system, the snare was unsuccessful. The patient requested that we stop the attempt given her discomfort. Signed, Dulcy Fanny. Dellia Nims, RPVI [facility] Radiology PLAN: The left ureteral stent may be withdrawn at the time of a right ureteral stent exchange. The patient requests sedation for her upcoming and routine left PCN exchange, as this was her first experience with the drain change and she was quite uncomfortable during the attempted foreign body retrieval performed without sedation Electronically Signed   By: Corrie Mckusick D.O.   On: 06/26/2020 12:58   IR Radiologist Eval & Mgmt  Result Date: 07/02/2020 Please refer to notes tab for details about interventional procedure. (Op Note)  IR TRANSCATH RETRIEVAL FB INCL GUIDANCE (MS)  Result Date: 06/27/2020 INDICATION: 69 year old female with a history of ureteral obstruction and bilateral indwelling ureteral stents. Failure of the left ureteral stent has necessitated the need for left PCN, performed 06/17/2020. The patient has had constant drainage at the skin site and presents for exchange possible up sizing, and possible foreign body retrieval of the nonfunctional left ureteral stent. EXAM: IR EXCHANGE NEPHROSTOMY LEFT COMPARISON:  None. MEDICATIONS: None. ANESTHESIA/SEDATION: None CONTRAST:  23mL OMNIPAQUE IOHEXOL 300 MG/ML SOLN - administered  into the collecting system(s) FLUOROSCOPY TIME:  Fluoroscopy Time: 10 minutes 42 seconds (129 mGy). COMPLICATIONS: None PROCEDURE: Informed written consent was obtained from the patient after a thorough discussion of the procedural risks, benefits and alternatives. All questions were addressed. Maximal Sterile Barrier Technique was utilized including caps, mask, sterile gowns, sterile gloves, sterile drape, hand hygiene and skin antiseptic. A timeout was performed prior to the initiation of the procedure. Patient was position prone and the left flank and indwelling PCN were prepped and draped in the usual sterile fashion. Contrast was injected with images stored and sent to PACs. 1% lidocaine was used for local anesthesia. The suture was ligated. Bentson wire was passed into the collecting system and the 10 [place] drain was removed. Eight French sheath was then placed into the collecting system. We initially passed and ensnare, although the ensnare would not manipulate near the tip of the double-J stent, which was caught in a lower pole calyx/infundibulum. The ensnare was removed and then we attempted to manipulate the double-J stent proximally with the use of a omni flush catheter. The Omni Flush catheter was used with torquing the catheter to withdraw into the collecting system, also with the use of a Bentson wire in attempt to lasso the double-J stent. Ultimately this failed, and during these attempts the patient asked [place] to stop trying, as she was quite uncomfortable during this manipulation. This had somewhat to do with the small collecting system in the renal pelvis as this was decompressed by the indwelling PCN. We then proceeded with a routine exchange of the sheath and placement of a new 20 French PCN on the Bentson wire which was formed in the collecting system. Catheter was sutured in position and attached to gravity drainage. No complications encountered and no significant blood loss. IMPRESSION: Status  post routine exchange of a right PCN for up sizing to a 12 [place] drain. Attempt was made to perform foreign body retrieval of the double-J stent, however, given that the proximal loop of the stent is partially un formed with the tip varied in the lower pole collecting system, the snare was unsuccessful. The patient requested that we stop the attempt given her discomfort. Signed, Dulcy Fanny. Dellia Nims, RPVI [facility] Radiology PLAN: The left ureteral stent may be withdrawn at the time of a right ureteral stent exchange. The patient requests sedation for her upcoming and routine left PCN exchange, as this was her first experience with the drain change and she was quite uncomfortable during the attempted foreign body retrieval performed without sedation Electronically Signed   By: Corrie Mckusick D.O.   On: 06/26/2020 12:58    Labs:  CBC: Recent Labs    06/17/20 1204 06/18/20 0621 06/19/20 0347 06/20/20 0841  WBC 11.3* 7.1 6.1 5.5  HGB 9.4* 8.3* 7.9* 8.2*  HCT 30.0* 26.4* 24.1* 25.3*  PLT 327 263 281 315    COAGS: Recent Labs    06/17/20 1638  INR 1.1    BMP: Recent Labs    06/19/20 0347 06/20/20 0841 06/21/20 0340 06/22/20 0311  NA 135 138 140 139  K 3.7 3.4* 4.0 3.9  CL 103 105 108 107  CO2 25 24 21* 22  GLUCOSE 94 121* 90 92  BUN 25* 15 13 14   CALCIUM 7.3* 7.7* 7.9* 7.9*  CREATININE 3.40* 1.93* 1.73* 1.72*  GFRNONAA 14* 28* 32* 32*    LIVER FUNCTION TESTS: Recent Labs    06/17/20 1204 06/17/20 1204 06/18/20 0621 06/18/20 0621 06/19/20 0347 06/20/20 0841 06/21/20 0340 06/22/20 0311  BILITOT 0.5  --  0.5  --  0.2* 0.4  --   --   AST 13*  --  13*  --  14* 14*  --   --   ALT 10  --  8  --  7 7  --   --   ALKPHOS 63  --  54  --  55 57  --   --   PROT 7.0  --  5.8*  --  5.9* 6.3*  --   --   ALBUMIN 3.4*   < > 2.8*   < > 2.7* 2.9* 2.8* 2.8*   < > = values in this interval not displayed.    TUMOR MARKERS: No  results for input(s): AFPTM, CEA, CA199, CHROMGRNA in the last 8760 hours.  Assessment and Plan:  [name] is a 69 y.o. 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  Patient states that this past Saturday (11/20), she awoke with extensive pericatheter leakage however states since that time there has been no leakage associated with the nephrostomy catheter.  On physical exam, there is normal clear appearing urine within the attached gravity bag.  As such, there is no need for fluoroscopic guided injection to confirm placement.  Patient was educated regarding appropriate care for the nephrostomy catheter.    She was educated that the catheter may be associated with intermittent leakage.  If the leakage becomes excessive, she may flush the nephrostomy catheter with 5-10  cc of normal saline (patient was given 5 saline flushes).  Plan of care was discussed with referring urologist, Dr. Louis Meckel as follows: -Patient will return for routine fluoroscopic guided exchange and potential retrieval of pre-existing left-sided ureteral stent in January of this year.  This procedure will be performed with conscious sedation in hopes of retrieving the ureteral stent.  Given patient's disease burden, if ureteral stent retrieval becomes challenging, the stent may be retrieved cystoscopically.  The patient knows to call the interventional radiology department at [facility] with any future questions or concerns.  A copy of this report was sent to the requesting provider on this date.  Electronically  Signed: Sandi Mariscal 07/02/2020, 11:44 AM   I spent a total of 15 Minutes in face to face in clinical consultation, greater than 50% of which was counseling/coordinating care for left-sided PCN evaluation and management

## 2020-07-09 ENCOUNTER — Encounter (HOSPITAL_COMMUNITY): Payer: Self-pay

## 2020-07-09 ENCOUNTER — Encounter (HOSPITAL_COMMUNITY)
Admission: RE | Admit: 2020-07-09 | Discharge: 2020-07-09 | Disposition: A | Discharge status: Home / Self Care | Payer: Medicare Other | Source: Ambulatory Visit | Attending: Oncology | Admitting: Oncology

## 2020-07-09 ENCOUNTER — Other Ambulatory Visit: Payer: Self-pay

## 2020-07-09 DIAGNOSIS — J929 Pleural plaque without asbestos: Secondary | ICD-10-CM | POA: Diagnosis not present

## 2020-07-09 DIAGNOSIS — C50919 Malignant neoplasm of unspecified site of unspecified female breast: Secondary | ICD-10-CM | POA: Diagnosis not present

## 2020-07-09 DIAGNOSIS — C7951 Secondary malignant neoplasm of bone: Secondary | ICD-10-CM | POA: Diagnosis not present

## 2020-07-09 DIAGNOSIS — Z170 Estrogen receptor positive status [ER+]: Secondary | ICD-10-CM | POA: Diagnosis not present

## 2020-07-09 DIAGNOSIS — N1330 Unspecified hydronephrosis: Secondary | ICD-10-CM

## 2020-07-09 DIAGNOSIS — C210 Malignant neoplasm of anus, unspecified: Secondary | ICD-10-CM

## 2020-07-09 DIAGNOSIS — I2510 Atherosclerotic heart disease of native coronary artery without angina pectoris: Secondary | ICD-10-CM | POA: Diagnosis not present

## 2020-07-09 DIAGNOSIS — C50812 Malignant neoplasm of overlapping sites of left female breast: Secondary | ICD-10-CM | POA: Diagnosis not present

## 2020-07-09 LAB — GLUCOSE, CAPILLARY: Glucose-Capillary: 98 mg/dL (ref 70–99)

## 2020-07-09 MED ORDER — FLUDEOXYGLUCOSE F - 18 (FDG) INJECTION
5.9800 | Freq: Once | INTRAVENOUS | Status: AC | PRN
  Administered 2020-07-09: 5.98 via INTRAVENOUS

## 2020-07-10 NOTE — Progress Notes (Signed)
[place]  [phone] [phone]) [phone]     ID: JILLIENNE EGNER DOB: [birth year]  MR#: [id number]  UDJ#:[id number]  Patient Care Team: Celene Squibb, MD as PCP - General (Internal Medicine) Chauncey Cruel, MD OTHER MD:  I connected with Teofilo Pod on 07/11/20 at 10:30 AM EST by video enabled telemedicine visit and verified that I am speaking with the correct person using two identifiers.   I discussed the limitations, risks, security and privacy concerns of performing an evaluation and management service by telemedicine and the availability of in-person appointments. I also discussed with the patient that there may be a patient responsible charge related to this service. The patient expressed understanding and agreed to proceed.   Other persons participating in the visit and their role in the encounter: Patient's Sister Dawn  Patient's location: Home Provider's location: [place]    CHIEF COMPLAINT: Metastatic breast cancer  CURRENT TREATMENT: To start talazoparib   HISTORY OF CURRENT ILLNESS: From Robynn Pane, PA-C note on 09/24/2016:  "Teofilo Pod 69 y.o. female returns for followup of Stage IV breast cancer ER/PR positive, HER-2/neu negative. BRCA2 POSITIVE. History of stage IIIc left breast cancer in 2009 treated with neoadjuvant chemotherapy consisting of epirubicin and Cytoxan followed by 4 cycles of docetaxel from 04/16/2008-08/13/2008 with the initiation of Herceptin for 52 weeks at the start of docetaxel. She then underwent a left mastectomy on 08/30/2008 showing persistent disease in the breast and lymph nodes. This was followed by radiation therapy from 09/17/2008-11/26/2008 to the left chest wall, supraclavicular, and axillary node areas. She then moved on to antiestrogen therapy with letrozole (11/27/2008-04/20/2011). She was found to have recurrent breast cancer on supraclavicular lymph node biopsy on 03/30/2011  leading to a change in antiestrogen therapy to tamoxifen (04/21/2011-06/04/2014). Pet imaging on 05/25/2014 demonstrated progression of left submandibular lymph node, mediastinal lymph node, and probable lung involvement. This was followed by a biopsy on 06/13/2014 of a left supraclavicular mass that did reveal metastatic carcinoma consistent with breast primary. She was then treated with carboplatin and Taxol for 6 cycles with an excellent response (06/25/2014-10/09/2014). She was then transition back to hormone therapy consisting of Ibrance and Faslodex (11/13/2014-06/10/2015). This was complicated by grade 3 neutropenia with cycle #2 resulting in a dose reduction of Ibrance to 100 mg for cycle 3. Unfortunately, on 06/10/2015, she was noted to have progression of disease in the right mid cervical lymph node over a 4 day period that was PET avid. Therapy was therefore changed to exemestane and everolimus (58/85/0277-41/28/7867) that was complicated by an adverse reaction consisting of diffuse pneumonitis. This regimen was therefore discontinued. She then transitioned back to systemic chemotherapy consisting of cisplatin every 14 days (08/23/2015-12/23/2015). She was put on a drug holiday beginning on 12/23/2015 in preparation for a trip to [place] on 01/25/2016. She was on Xeloda maintenance in a 7 day on and 7 day off fashion beginning on 04/20/2016, but progression of disease was noted in cervical lymph node.  She is NOW on [place] beginning on 08/26/2016. AND squamous cell carcinoma of anus, HPV POSITIVE, S/P XRT with Xeloda concomitantly in the neoadjuvant setting (02/07/2016- 03/10/2016).  S/P APR by Dr. Leighton Ruff on 6/72/0947, demonstrating a partial response to therapy, Stage IIA (T2N0M0).  Initial medical oncology recommendations provided by Dr. Tressie Stalker at [facility]"  The patient's subsequent history is as detailed below.   INTERVAL HISTORY: Edwardine was contacted today for evaluation of her history of  metastatic  breast cancer MI: She was accompanied by her sister.  She underwent restaging PET scan on 07/09/2020 showing: no substantial change in hypermetabolic level II cervical lymph nodes and right subpectoral tissue, imaging features remain concerning for metastatic involvement (last PET 07/2019); interval development of hypermetabolic pleural-based lesions in anteromedial right hemithorax and posteromedial left hemithorax, concerning for new metastatic disease; similar low-level FDG accumulation in treatment-related presacral soft tissue thickening.   REVIEW OF SYSTEMS: She tells me she is feeling generally good although that varies from day-to-day.  She has some nausea.  Sometimes she feels dizzy.  She has a slight cough and some shortness of breath at times.  She continues to have pain in her flanks.  Could be either side.  She is still eating and drinking and having regular bowel movements.  There is no problems with bladder function.  She is not aware of any rash or bleeding problems.   COVID 19 VACCINATION STATUS:    PAST MEDICAL HISTORY: Past Medical History:  Diagnosis Date  . Adenocarcinoma of breast ([place]) 01/27/2011   stage IIIc breast cancer  . Bladder infection   . Bone metastasis ([place]) 01/17/2016  . Borderline hypertension   . Breast cancer ([place])   . Closed fracture of humerus sept 2013  . Depression   . Family history of adverse reaction to anesthesia    father remembered his surgery  . Fatigue   . GERD (gastroesophageal reflux disease)   . Hypertension   . 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 Osteoporosis 01/27/2011  . Pneumonia   . Port catheter in place 04/14/2013  . Post-mastectomy lymphedema syndrome   . 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 Reflux   . Retinal detachment   . 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 Swollen lymph nodes   . URI (upper respiratory infection)  02/17/12  . Wears glasses     PAST SURGICAL HISTORY: Past Surgical History:  Procedure Laterality Date  . ABDOMINAL PERINEAL BOWEL RESECTION N/A 09/01/2016   Procedure: ABDOMINAL PERINEAL RESECTION PERMANENT COLOSTOMY;  Surgeon: Leighton Ruff, MD;  Location: [facility];  Service: General;  Laterality: N/A;  . biopsy of lymph node     super clavicle  . BTL    . CATARACT EXTRACTION    . cervical disc fusioni    . CYSTOSCOPY W/ URETERAL STENT PLACEMENT Bilateral 06/03/2020   Procedure: CYSTOSCOPY WITH RETROGRADE PYELOGRAM/URETERAL STENT PLACEMENT bladder biopsy;  Surgeon: [name], MD;  Location: [facility];  Service: Urology;  Laterality: Bilateral;  . CYSTOSCOPY WITH STENT PLACEMENT Left 06/07/2020   Procedure: CYSTOSCOPY LEFT STENT EXCHANGE, retrograde pylogram;  Surgeon: [name], MD;  Location: [facility];  Service: Urology;  Laterality: Left;  . IR NEPHROSTOMY EXCHANGE LEFT  06/26/2020  . IR NEPHROSTOMY PLACEMENT LEFT  06/17/2020  . IR RADIOLOGIST EVAL & MGMT  07/02/2020  . IR TRANSCATH RETRIEVAL FB INCL GUIDANCE (MS)  06/26/2020  . LYMPH NODE BIOPSY Right 06/19/2015   Procedure: RIGHT NECK LYMPH NODE EXCISION;  Surgeon: Rolm Bookbinder, MD;  Location: [facility];  Service: General;  Laterality: Right;  . LYMPH NODE DISSECTION Left 04/26/2017   Procedure: EXCISION LEFT NECK LYMPH NODE;  Surgeon: Rolm Bookbinder, MD;  Location: [facility];  Service: General;  Laterality: Left;  Marland Kitchen MASS EXCISION Left 07/27/2016   Procedure: EXCISION OF POSTERIOR NECK MASS;  Surgeon: Rolm Bookbinder, MD;  Location: [place];  Service: General;  Laterality: Left;  Marland Kitchen MASTECTOMY MODIFIED RADICAL     left breast  . Port- a-cath insertion    . retinal detachment and repair      FAMILY HISTORY: Family History  Problem Relation Age of Onset  . Hypertension Mother   . Heart disease Mother        atrial fib  . Heart disease Father   . Other Father        glaucoma       GYNECOLOGIC HISTORY:     SOCIAL HISTORY: (updated 07/2020)  Katharine Look    ADVANCED DIRECTIVES:    HEALTH MAINTENANCE: Social History   Tobacco Use  . Smoking status: Never Smoker  . Smokeless tobacco: Never Used  Vaping Use  . Vaping Use: Never used  Substance Use Topics  . Alcohol use: Yes    Alcohol/week: 7.0 standard drinks    Types: 7 Glasses of wine per week    Comment: 1 drink daily  . Drug use: Yes    Types: Marijuana    Comment: During chemo, none over 1 Year     Colonoscopy:   PAP:   Bone density:    Allergies  Allergen Reactions  . Ciprofloxacin Other (See Comments)    tendonitis  . Prochlorperazine Anxiety    Other reaction(s): Confusion    Current Outpatient Medications  Medication Sig Dispense Refill  . acetaminophen (TYLENOL) 500 MG tablet Take 1,000 mg by mouth every 6 (six) hours as needed for moderate pain or headache.    Marland Kitchen buPROPion (WELLBUTRIN SR) 100 MG 12 hr tablet Take 100 mg by mouth 2 (two) times daily.    Marland Kitchen CALCIUM-VITAMIN D PO Take 1 tablet by mouth 2 (two) times daily.     Marland Kitchen denosumab (XGEVA) 120 MG/1.7ML SOLN injection Inject 120 mg into the skin every 3 (three) months.    . docusate sodium (COLACE) 100 MG capsule Take 1 capsule (100 mg total) by mouth daily. 10 capsule 0  . metoprolol succinate (TOPROL-XL) 25 MG 24 hr tablet Take 25 mg by mouth in the morning and at bedtime.   5  . Multiple Vitamin (MULTIVITAMIN WITH MINERALS) TABS tablet Take 1 tablet by mouth daily. 90 tablet 0  . omeprazole (PRILOSEC) 40 MG capsule Take 1 capsule (40 mg total) by mouth 2 (two) times daily. (Patient taking differently: Take 40 mg by mouth in the morning and at bedtime. ) 180 capsule 4  . polyethylene glycol (MIRALAX / GLYCOLAX) 17 g packet Take 17 g by mouth daily. 14 each 0  . potassium chloride (KLOR-CON) 10 MEQ tablet Take 20 mEq by mouth 2 (two) times daily.    . prochlorperazine (COMPAZINE) 5 MG tablet Take 1 tablet (5 mg total) by mouth 4  (four) times daily -  before meals and at bedtime. 30 tablet 0  . sodium chloride 0.9 % infusion Inject 1,000 mLs into the vein daily. 30000 mL 0  . talazoparib tosylate (TALZENNA) 0.25 MG capsule Take 2 capsules (0.5 mg total) by mouth daily. To start 07/01/2020 60 capsule 3  . vitamin B-12 (CYANOCOBALAMIN) 1000 MCG tablet Take 1,000 mcg by mouth 2 (two) times daily.      No current facility-administered medications for this visit.    OBJECTIVE:   There were no vitals filed for this visit.   There is no height or weight on file to calculate BMI.   Wt Readings from Last 3 Encounters:  07/09/20 120 lb (54.4 kg)  06/17/20 123 lb 4.8 oz (55.9 kg)  06/07/20 132 lb (59.9 kg)      ECOG FS:  Telemedicine visit 07/11/2020   LAB RESULTS:  CMP     Component Value Date/Time   NA 139 06/22/2020 0311   K 3.9 06/22/2020 0311   CL 107 06/22/2020 0311   CO2 22 06/22/2020 0311   GLUCOSE 92 06/22/2020 0311   BUN 14 06/22/2020 0311   CREATININE 1.72 (H) 06/22/2020 0311   CALCIUM 7.9 (L) 06/22/2020 0311   PROT 6.3 (L) 06/20/2020 0841   ALBUMIN 2.8 (L) 06/22/2020 0311   AST 14 (L) 06/20/2020 0841   ALT 7 06/20/2020 0841   ALKPHOS 57 06/20/2020 0841   BILITOT 0.4 06/20/2020 0841   GFRNONAA 32 (L) 06/22/2020 0311   GFRAA >60 10/07/2016 0834    No results found for: TOTALPROTELP, ALBUMINELP, A1GS, A2GS, BETS, BETA2SER, GAMS, MSPIKE, SPEI  Lab Results  Component Value Date   WBC 5.5 06/20/2020   NEUTROABS 10.0 (H) 06/17/2020   HGB 8.2 (L) 06/20/2020   HCT 25.3 (L) 06/20/2020   MCV 102.4 (H) 06/20/2020   PLT 315 06/20/2020    Lab Results  Component Value Date   LABCA2 33.2 08/11/2016    No components found for: QASTMH962  No results for input(s): INR in the last 168 hours.  Lab Results  Component Value Date   LABCA2 33.2 08/11/2016    No results found for: CAN199  No results found for: IWL798  Lab Results  Component Value Date   CAN153 26.4 (H) 08/11/2016    No  results found for: CA2729  No components found for: HGQUANT  No results found for: CEA1 / No results found for: CEA1   No results found for: AFPTUMOR  No results found for: CHROMOGRNA  No results found for: KPAFRELGTCHN, LAMBDASER, KAPLAMBRATIO (kappa/lambda light chains)  No results found for: HGBA, HGBA2QUANT, HGBFQUANT, HGBSQUAN (Hemoglobinopathy evaluation)   Lab Results  Component Value Date   LDH 176 04/27/2008    Lab Results  Component Value Date   IRON 79 10/07/2016   TIBC 277 10/07/2016   IRONPCTSAT 28 10/07/2016   (Iron and TIBC)  Lab Results  Component Value Date   FERRITIN 156 10/07/2016    Urinalysis    Component Value Date/Time   COLORURINE STRAW (A) 06/03/2020 [place] 06/03/2020 1613   LABSPEC 1.008 06/03/2020 1613   PHURINE 6.0 06/03/2020 1613   GLUCOSEU NEGATIVE 06/03/2020 1613   [place] 06/03/2020 1613   BILIRUBINUR NEGATIVE 06/03/2020 1613   KETONESUR NEGATIVE 06/03/2020 [place] 06/03/2020 1613   UROBILINOGEN 0.2 08/06/2008 1500   NITRITE NEGATIVE 06/03/2020 1613   [place] 06/03/2020 1613     STUDIES: DG Abdomen 1 View  Result Date: 06/17/2020 CLINICAL DATA:  Abdominal pain EXAM: ABDOMEN - 1 VIEW COMPARISON:  CT abdomen and pelvis June 06, 2020. FINDINGS: Double-J stents are present bilaterally, stable. Ostomy left mid abdomen, unchanged. No abnormal calcifications demonstrable. No bowel dilatation or air-fluid level to suggest bowel obstruction. No free air on supine examination. IMPRESSION: Stable appearing double-J stents, unchanged in positions. Ostomy left mid abdomen. No abnormal calcification. No bowel obstruction or free air evident. Electronically Signed   By: Lowella Grip III M.D.   On: 06/17/2020 13:42   US Renal  Result Date: 06/17/2020 CLINICAL DATA:  Progressive flank region pain EXAM: RENAL / URINARY TRACT ULTRASOUND COMPLETE COMPARISON:  Renal ultrasound  June 05, 2020; CT abdomen and pelvis June 06, 2020 FINDINGS: Right Kidney: Renal measurements: 11.0 x 6.0 x 7.0 cm = volume: 267 mL. Echogenicity and renal cortical thickness are within normal limits. No mass or perinephric fluid visualized. Scarring lateral right kidney noted. There is moderate hydronephrosis on the right. A stent is present extending from the right renal pelvis extending to the bladder. Left Kidney: Renal measurements: 11.5 x 6.5 x 5.5 cm = volume: 215 mL. Echogenicity and renal cortical thickness are within normal limits. No mass or perinephric fluid visualized. Moderate hydronephrosis. Double-J stent extending from left renal pelvis to the bladder level. Bladder: Double-J stents in bladder.  Bladder virtually empty. Other: None. IMPRESSION: Double-J stents present bilaterally. Despite presence of stents, moderate hydronephrosis is present on each side. Scarring lateral right kidney. Study otherwise unremarkable. Electronically Signed   By: Lowella Grip III M.D.   On: 06/17/2020 13:47   NM PET Image Restag (PS) Skull Base To Thigh  Result Date: 07/10/2020 CLINICAL DATA:  Subsequent treatment strategy for breast cancer. EXAM: NUCLEAR MEDICINE PET SKULL BASE TO THIGH TECHNIQUE: 5.9 mCi F-18 FDG was injected intravenously. Full-ring PET imaging was performed from the skull base to thigh after the radiotracer. CT data was obtained and used for attenuation correction and anatomic localization. Fasting blood glucose: 98 mg/dl COMPARISON:  08/10/2019 FINDINGS: Mediastinal blood pool activity: SUV max 2.6 Liver activity: SUV max NA NECK: Similar hypermetabolism again identified in the neck bilaterally. Level II left-sided node not discretely measurable, but obscuration of fat planes visible on image 19/4. SUV max = 3.7 today compared to 4.3 previously. Similar low level uptake on the right in the level II region. Incidental CT findings: none CHEST: Similar low level uptake identified in  the right subpectoral region corresponding to amorphous soft tissue visible on 58/4. SUV max = 3.1 today compared to 3.8 previously. Low level FDG accumulation identified in sub pleural consolidative opacity in the anterior left upper lobe, similar to prior and most likely treatment related. A new hypermetabolic focus is identified between the descending aorta and heart on image 82/4. SUV max = 4.6. Unclear if this is mediastinal or pulmonary although pleural based pulmonary etiology suspected as soft tissue tracks from this location down along the pleura posterior to the heart (see image 88/4). A second new, similar subpleural/pleural plaque-like soft tissue lesion in the anteromedial right hemithorax (91/4) demonstrates SUV max = 4.3. Incidental CT findings: Interlobular septal thickening in the right apex is similar to prior. Stable 6-7 mm right upper lobe pulmonary nodule on 64/4 without hypermetabolism. Right Port-A-Cath tip is positioned in the distal SVC. Coronary artery calcification is evident. Atherosclerotic calcification is noted in the wall of the thoracic aorta. ABDOMEN/PELVIS: No abnormal hypermetabolic activity within the liver, pancreas, adrenal glands, or spleen. No hypermetabolic lymph nodes in the abdomen or pelvis. Similar appearance of low level uptake in 10 presume treatment related presacral soft tissue attenuation. Incidental CT findings: Bilateral internal ureteral stents evident with left percutaneous nephrostomy catheter identified. Mild right hydronephrosis noted without evidence for left hydronephrosis. Left abdominal colostomy again noted. SKELETON: No focal hypermetabolic activity to suggest skeletal metastasis. Incidental CT findings: Similar sclerotic changes in the L1 and L3 vertebral bodies without hypermetabolic FDG uptake. IMPRESSION: 1. No substantial change in hypermetabolic level II cervical lymph nodes and right subpectoral soft tissue. Imaging features remain concerning for  metastatic involvement. 2. Interval development of hypermetabolic pleural based lesions in the anteromedial right hemithorax and posteromedial left  hemithorax. Imaging features concerning for new metastatic disease. 3. Similar low level FDG accumulation in treatment related presacral soft tissue thickening. 4.  Aortic Atherosclerois (ICD10-170.0) Electronically Signed   By: Misty Stanley M.D.   On: 07/10/2020 09:16   DG CHEST PORT 1 VIEW  Result Date: 06/19/2020 CLINICAL DATA:  69 year old female with metastatic breast cancer. Hydronephrosis. Cough. EXAM: PORTABLE CHEST 1 VIEW COMPARISON:  Chest CT 09/18/2019 and earlier. FINDINGS: Portable AP semi upright view at 1800 hours. Stable right chest porta cath. Mildly lower lung volumes. Mediastinal contours remain normal. Allowing for portable technique the lungs are clear. No pneumothorax or pleural effusion. Stable surgical clips at the left axilla, left thoracic inlet. Prior cervical ACDF. Bulky deformity of the mid right clavicle is new since 2017. And there is around osteo lytic lesion of the right humeral head with adjacent minimally displaced chronic fracture. Negative visible bowel gas pattern. IMPRESSION: 1. No acute cardiopulmonary abnormality. 2. Bone metastases and chronic fractures about the right shoulder. Electronically Signed   By: Genevie Ann M.D.   On: 06/19/2020 19:59   CT Renal Stone Study  Result Date: 06/17/2020 CLINICAL DATA:  Follow-up hydronephrosis. Worsening left flank pain. EXAM: CT ABDOMEN AND PELVIS WITHOUT CONTRAST TECHNIQUE: Multidetector CT imaging of the abdomen and pelvis was performed following the standard protocol without IV contrast. COMPARISON:  06/06/2020 FINDINGS: Lower chest: Resolution of left effusion. Abnormal interstitial pulmonary density persists. Hepatobiliary: Normal without contrast. Pancreas: Normal Spleen: Normal Adrenals/Urinary Tract: Adrenal glands are normal. Bilateral ureteral stents remain in place, with  the ends in the renal collecting system and bladder. Considerable reduction in perinephric edema previously seen on the left. Moderate fullness of the left renal collecting system does persist. Mild fullness of the right renal collecting system persists. Stomach/Bowel: Previous colostomy and distal colectomy. Soft tissue thickening in the presacral soft tissues appears unchanged. Vascular/Lymphatic: Aortic atherosclerosis. No aneurysm. IVC is normal. No retroperitoneal adenopathy is seen. Reproductive: Previous hysterectomy. Other: No free fluid or air. Musculoskeletal: Scattered osseous metastatic lesions are not significantly changed since the recent examinations. IMPRESSION: 1. Bilateral ureteral stents remain in place, with the ends in the renal collecting systems and bladder. Considerable reduction in perinephric edema previously seen on the left. Moderate fullness of the left renal collecting system does persist. Mild fullness of the right renal collecting system persists. 2. Previous colostomy and distal colectomy. Soft tissue thickening in the presacral soft tissues appears unchanged. Scattered osseous metastatic lesions are not significantly changed. 3. Resolution of left effusion. Abnormal pulmonary interstitial pulmonary density persists. 4. Aortic atherosclerosis. Aortic Atherosclerosis (ICD10-I70.0). Electronically Signed   By: Nelson Chimes M.D.   On: 06/17/2020 15:09   IR NEPHROSTOMY PLACEMENT LEFT  Result Date: 06/18/2020 CLINICAL DATA:  History of metastatic breast carcinoma, bilateral hydronephrosis, bilateral ureteral stent placement and worsening renal function with acute left flank pain. Due to worsening hydronephrosis and acute left flank pain, decision has been made to place a left percutaneous nephrostomy tube at this time. EXAM: 1. ULTRASOUND GUIDANCE FOR PUNCTURE OF THE LEFT RENAL COLLECTING SYSTEM. 2. LEFT PERCUTANEOUS NEPHROSTOMY TUBE PLACEMENT. COMPARISON:  CT of the abdomen and  pelvis as well as renal ultrasound earlier today. ANESTHESIA/SEDATION: 3.0 mg IV Versed; 100 mcg IV Fentanyl. Total Moderate Sedation Time 18 minutes. Moderate (conscious) sedation was employed during this procedure. A total of Versed 3.0 mg and Fentanyl 100 mcg was administered intravenously. The patient's level of consciousness and vital signs were monitored continuously by radiology nursing throughout the  procedure under my direct supervision. CONTRAST:  12 ml Omnipaque 300 MEDICATIONS: 2 g IV Ancef. Antibiotic was administered in an appropriate time frame prior to skin puncture. FLUOROSCOPY TIME:  1 minutes and 18 seconds.  8.0 mGy. PROCEDURE: The procedure, risks, benefits, and alternatives were explained to the patient. Questions regarding the procedure were encouraged and answered. The patient understands and consents to the procedure. A time-out was performed prior to initiating the procedure. Ultrasound was utilized to localize the left kidney. The left flank region was prepped with chlorhexidine in a sterile fashion, and a sterile drape was applied covering the operative field. A sterile gown and sterile gloves were used for the procedure. Local anesthesia was provided with 1% Lidocaine. Under direct ultrasound guidance, a 21 gauge needle was advanced into the renal collecting system. Ultrasound image documentation was performed. Aspiration of urine sample was performed followed by contrast injection. A transitional dilator was advanced over a guidewire. Percutaneous tract dilatation was then performed over the guidewire. A 10-French percutaneous nephrostomy tube was then advanced and formed in the collecting system. Catheter position was confirmed by fluoroscopy after contrast injection. The catheter was secured at the skin with a Prolene retention suture and Stat-Lock device. A gravity bag was placed. COMPLICATIONS: None. FINDINGS: Ultrasound demonstrates moderate hydronephrosis. Via interpolar caliceal  access, a 10 French nephrostomy tube was advanced and formed in the renal pelvis. There is good return of urine from the nephrostomy tube after placement. IMPRESSION: Placement of 10 French left percutaneous nephrostomy tube via interpolar caliceal access. The tube was formed in the renal pelvis and connected to gravity bag drainage. Electronically Signed   By: Aletta Edouard M.D.   On: 06/18/2020 08:37   IR NEPHROSTOMY EXCHANGE LEFT  Result Date: 06/26/2020 INDICATION: 69 year old female with a history of ureteral obstruction and bilateral indwelling ureteral stents. Failure of the left ureteral stent has necessitated the need for left PCN, performed 06/17/2020. The patient has had constant drainage at the skin site and presents for exchange possible up sizing, and possible foreign body retrieval of the nonfunctional left ureteral stent. EXAM: IR EXCHANGE NEPHROSTOMY LEFT COMPARISON:  None. MEDICATIONS: None. ANESTHESIA/SEDATION: None CONTRAST:  30m OMNIPAQUE IOHEXOL 300 MG/ML SOLN - administered into the collecting system(s) FLUOROSCOPY TIME:  Fluoroscopy Time: 10 minutes 42 seconds (129 mGy). COMPLICATIONS: None PROCEDURE: Informed written consent was obtained from the patient after a thorough discussion of the procedural risks, benefits and alternatives. All questions were addressed. Maximal Sterile Barrier Technique was utilized including caps, mask, sterile gowns, sterile gloves, sterile drape, hand hygiene and skin antiseptic. A timeout was performed prior to the initiation of the procedure. Patient was position prone and the left flank and indwelling PCN were prepped and draped in the usual sterile fashion. Contrast was injected with images stored and sent to PACs. 1% lidocaine was used for local anesthesia. The suture was ligated. Bentson wire was passed into the collecting system and the 10 FPakistandrain was removed. Eight French sheath was then placed into the collecting system. We initially passed  and ensnare, although the ensnare would not manipulate near the tip of the double-J stent, which was caught in a lower pole calyx/infundibulum. The ensnare was removed and then we attempted to manipulate the double-J stent proximally with the use of a omni flush catheter. The Omni Flush catheter was used with torquing the catheter to withdraw into the collecting system, also with the use of a Bentson wire in attempt to lasso the double-J stent. Ultimately  this failed, and during these attempts the patient asked [place] to stop trying, as she was quite uncomfortable during this manipulation. This had somewhat to do with the small collecting system in the renal pelvis as this was decompressed by the indwelling PCN. We then proceeded with a routine exchange of the sheath and placement of a new 53 French PCN on the Bentson wire which was formed in the collecting system. Catheter was sutured in position and attached to gravity drainage. No complications encountered and no significant blood loss. IMPRESSION: Status post routine exchange of a right PCN for up sizing to a 12 [place] drain. Attempt was made to perform foreign body retrieval of the double-J stent, however, given that the proximal loop of the stent is partially un formed with the tip varied in the lower pole collecting system, the snare was unsuccessful. The patient requested that we stop the attempt given her discomfort. Signed, Dulcy Fanny. Dellia Nims, RPVI [facility] Radiology PLAN: The left ureteral stent may be withdrawn at the time of a right ureteral stent exchange. The patient requests sedation for her upcoming and routine left PCN exchange, as this was her first experience with the drain change and she was quite uncomfortable during the attempted foreign body retrieval performed without sedation Electronically Signed   By: Corrie Mckusick D.O.   On: 06/26/2020 12:58   IR Radiologist Eval & Mgmt  Result Date:  07/02/2020 Please refer to notes tab for details about interventional procedure. (Op Note)  IR TRANSCATH RETRIEVAL FB INCL GUIDANCE (MS)  Result Date: 06/27/2020 INDICATION: 69 year old female with a history of ureteral obstruction and bilateral indwelling ureteral stents. Failure of the left ureteral stent has necessitated the need for left PCN, performed 06/17/2020. The patient has had constant drainage at the skin site and presents for exchange possible up sizing, and possible foreign body retrieval of the nonfunctional left ureteral stent. EXAM: IR EXCHANGE NEPHROSTOMY LEFT COMPARISON:  None. MEDICATIONS: None. ANESTHESIA/SEDATION: None CONTRAST:  11m OMNIPAQUE IOHEXOL 300 MG/ML SOLN - administered into the collecting system(s) FLUOROSCOPY TIME:  Fluoroscopy Time: 10 minutes 42 seconds (129 mGy). COMPLICATIONS: None PROCEDURE: Informed written consent was obtained from the patient after a thorough discussion of the procedural risks, benefits and alternatives. All questions were addressed. Maximal Sterile Barrier Technique was utilized including caps, mask, sterile gowns, sterile gloves, sterile drape, hand hygiene and skin antiseptic. A timeout was performed prior to the initiation of the procedure. Patient was position prone and the left flank and indwelling PCN were prepped and draped in the usual sterile fashion. Contrast was injected with images stored and sent to PACs. 1% lidocaine was used for local anesthesia. The suture was ligated. Bentson wire was passed into the collecting system and the 10 FPakistandrain was removed. Eight French sheath was then placed into the collecting system. We initially passed and ensnare, although the ensnare would not manipulate near the tip of the double-J stent, which was caught in a lower pole calyx/infundibulum. The ensnare was removed and then we attempted to manipulate the double-J stent proximally with the use of a omni flush catheter. The Omni Flush catheter was  used with torquing the catheter to withdraw into the collecting system, also with the use of a Bentson wire in attempt to lasso the double-J stent. Ultimately this failed, and during these attempts the patient asked [place] stop trying, as she was quite uncomfortable during this manipulation. This had somewhat to do with the small  collecting system in the renal pelvis as this was decompressed by the indwelling PCN. We then proceeded with a routine exchange of the sheath and placement of a new 66 French PCN on the Bentson wire which was formed in the collecting system. Catheter was sutured in position and attached to gravity drainage. No complications encountered and no significant blood loss. IMPRESSION: Status post routine exchange of a right PCN for up sizing to a 12 [place] drain. Attempt was made to perform foreign body retrieval of the double-J stent, however, given that the proximal loop of the stent is partially un formed with the tip varied in the lower pole collecting system, the snare was unsuccessful. The patient requested that we stop the attempt given her discomfort. Signed, Dulcy Fanny. Dellia Nims, RPVI [facility] Radiology PLAN: The left ureteral stent may be withdrawn at the time of a right ureteral stent exchange. The patient requests sedation for her upcoming and routine left PCN exchange, as this was her first experience with the drain change and she was quite uncomfortable during the attempted foreign body retrieval performed without sedation Electronically Signed   By: Corrie Mckusick D.O.   On: 06/26/2020 12:58     ELIGIBLE FOR AVAILABLE RESEARCH PROTOCOL: *no  ASSESSMENT: 70 y.o. [place] woman with stage IV BRCA2 positive breast cancer, as follows:  (1) left axillary nodal mass biopsy 04/09/2008 shows breast cancer, estrogen receptor 100% positive, progesterone receptor 5% positive, HER-2/neu amplified,             (a) staging studies  showed stage IIIc disease  (2) received epirubicin/cyclophosphamide x4 dose dense followed by docetaxel x4 between 04/16/2008 and 08/13/2018             (a) trastuzumab started with docetaxel and continued for 52 weeks  (3) left mastectomy 08/30/2008 showed persistent disease in the breast and lymph nodes.  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  (8) carboplatin and paclitaxel x6 cycles with good response (06/25/2014 through 10/09/2014)  (9) palbociclib and Faslodex 11/13/2014, discontinued 06/10/2015 with progression              (a) right cervical lymph node biopsy BRCA2 positive  (10) exemestane and everolimus 07/02/2015 through 08/01/2015             (a) possible pneumonitis from everolimus  (11) denosumab/Xgeva started December 2016  (12) cisplatin (40 mg/m every other week) 08/23/2015 through 12/23/2015 discontinued due to patient's desire for drug holiday  (13) olaparib 08/26/2016 through 09/08/2016--poorly tolerated             (a) ANAL CANCER resected 09/01/2016  (14) Megace 10/12/2016 through 05/11/2017, discontinued with progression             (a) repeat left posterior cervical chain node biopsy again estrogen receptor positive now progesterone receptor as well as HER-2 negative  (15) eribulin 06/01/2017 through 11/23/2017  (16) toremifene 02/11/2018 through 10/05/2018, discontinued with progression  (17) Doxil 10/18/2018 through 04/03/2019  (18) pembrolizumab  08/21/2019 through 10/22/2019  (18) carboplatin and paclitaxel 10/24/2019 through 02/23/2020  (19) to start talazoparib 07/15/2020   PLAN: I spoke today with Katharine Look and her sister today and we reviewed the PET scan.  It gives [place] a new baseline.  While there is significant disease I do not see anything that is immediately life-threatening.  She does have some cough and shortness of breath but otherwise her functional status is ECOG 2.  Her past treatment history is extensive as noted above.  There is really not a lot of room there for much except repetition which is unlikely to be helpful.  She tolerated olaparib very poorly but only took it for a few days.  Given her BRCA positivity I think it is reasonable to give PARP inhibitors a second try.  Today we discussed the possible toxicities side effects and complications of talazoparib.  We have not been able to obtain this yet for her but I was told today by pharmacy that they expect within the next few days it may be being shipped to her home.  Whenever she does receive it we are going to start at a low dose, namely 2 tablets daily just to see how she tolerates it.  If she tolerates it well after a week or 2 we can consider upping the dose  Tentatively I am is scheduling her for a repeat virtual visit in about 3 weeks.      Chelcie has a good understanding of the overall plan. She agrees with it. She knows the goal of treatment in her case is cure. She will call with any problems that may develop before her next visit here.   Virgie Dad. Kaidyn Hernandes, MD 07/11/2020 10:34 AM Medical Oncology and Hematology [facility] [place], [place] [address] Tel. [phone]    Fax. [phone]   This document serves as a record of services personally performed by Lurline Del, MD. It was created on his behalf by Wilburn Mylar, a trained medical scribe. The creation of this record is based on the scribe's personal  observations and the provider's statements to them.   I, Lurline Del MD, have reviewed the above documentation for accuracy and completeness, and I agree with the above.    *Total Encounter Time as defined by the Centers for Medicare and Medicaid Services includes, in addition to the face-to-face time of a patient visit (documented in the note above) non-face-to-face time: obtaining and reviewing outside history, ordering and reviewing medications, tests or procedures, care coordination (communications with other health care professionals or caregivers) and documentation in the medical record.

## 2020-07-11 ENCOUNTER — Inpatient Hospital Stay: Payer: Medicare Other | Attending: Oncology | Admitting: Oncology

## 2020-07-11 DIAGNOSIS — N179 Acute kidney failure, unspecified: Secondary | ICD-10-CM

## 2020-07-11 DIAGNOSIS — C50919 Malignant neoplasm of unspecified site of unspecified female breast: Secondary | ICD-10-CM

## 2020-07-11 DIAGNOSIS — N132 Hydronephrosis with renal and ureteral calculous obstruction: Secondary | ICD-10-CM | POA: Diagnosis not present

## 2020-07-11 DIAGNOSIS — C210 Malignant neoplasm of anus, unspecified: Secondary | ICD-10-CM

## 2020-07-11 DIAGNOSIS — N139 Obstructive and reflux uropathy, unspecified: Secondary | ICD-10-CM | POA: Diagnosis not present

## 2020-07-11 DIAGNOSIS — E860 Dehydration: Secondary | ICD-10-CM | POA: Diagnosis not present

## 2020-07-11 DIAGNOSIS — N1330 Unspecified hydronephrosis: Secondary | ICD-10-CM | POA: Diagnosis not present

## 2020-07-11 DIAGNOSIS — M8000XK Age-related osteoporosis with current pathological fracture, unspecified site, subsequent encounter for fracture with nonunion: Secondary | ICD-10-CM

## 2020-07-11 DIAGNOSIS — I129 Hypertensive chronic kidney disease with stage 1 through stage 4 chronic kidney disease, or unspecified chronic kidney disease: Secondary | ICD-10-CM | POA: Diagnosis not present

## 2020-07-11 DIAGNOSIS — Z1509 Genetic susceptibility to other malignant neoplasm: Secondary | ICD-10-CM

## 2020-07-11 DIAGNOSIS — C7951 Secondary malignant neoplasm of bone: Secondary | ICD-10-CM | POA: Diagnosis not present

## 2020-07-11 DIAGNOSIS — Z1501 Genetic susceptibility to malignant neoplasm of breast: Secondary | ICD-10-CM

## 2020-07-11 DIAGNOSIS — Z1502 Genetic susceptibility to malignant neoplasm of ovary: Secondary | ICD-10-CM

## 2020-07-11 DIAGNOSIS — I2510 Atherosclerotic heart disease of native coronary artery without angina pectoris: Secondary | ICD-10-CM

## 2020-07-11 DIAGNOSIS — C50812 Malignant neoplasm of overlapping sites of left female breast: Secondary | ICD-10-CM

## 2020-07-11 DIAGNOSIS — E440 Moderate protein-calorie malnutrition: Secondary | ICD-10-CM

## 2020-07-11 DIAGNOSIS — N131 Hydronephrosis with ureteral stricture, not elsewhere classified: Secondary | ICD-10-CM | POA: Diagnosis not present

## 2020-07-11 DIAGNOSIS — C50911 Malignant neoplasm of unspecified site of right female breast: Secondary | ICD-10-CM

## 2020-07-11 DIAGNOSIS — R059 Cough, unspecified: Secondary | ICD-10-CM | POA: Diagnosis not present

## 2020-07-11 DIAGNOSIS — Z170 Estrogen receptor positive status [ER+]: Secondary | ICD-10-CM

## 2020-07-11 DIAGNOSIS — Z436 Encounter for attention to other artificial openings of urinary tract: Secondary | ICD-10-CM | POA: Diagnosis not present

## 2020-07-11 DIAGNOSIS — N390 Urinary tract infection, site not specified: Secondary | ICD-10-CM

## 2020-07-11 MED ORDER — PROCHLORPERAZINE MALEATE 5 MG PO TABS: 5.0000 mg | ORAL_TABLET | Freq: Three times a day (TID) | ORAL | 4 refills | Status: AC

## 2020-07-12 NOTE — Telephone Encounter (Signed)
Patient is approved for [place] at no cost from [employer] 07/11/20-08/09/20.  Advertising copywriter.  [place] Patient [place] Phone [phone] Fax [phone] 07/12/2020 8:21 AM

## 2020-07-15 ENCOUNTER — Telehealth: Payer: Self-pay

## 2020-07-15 NOTE — Telephone Encounter (Signed)
Oral Chemotherapy Pharmacist Encounter  I spoke with patient for overview of new oral chemotherapy medication: Talzenna (talazoparib) for the treatment of HR positive, HER2-negative, BRCA2 positive metastatic breast cancer, planned duration until disease progression or unacceptable drug toxicity.   Pt is doing well. Counseled patient on administration, dosing, side effects, monitoring, drug-food interactions, safe handling, storage, and disposal.  Patient will take Talzenna 0.46m capsules, 2 capsules (0.554m by mouth once daily without regard to food.  Talzenna start date: 07/16/20  Side effects include but not limited to: fatigue, headache, nausea, vomiting, decreased blood counts, diarrhea, hyperglycemia    Myelodysplastic syndrome/acute myeloid leukemia (MDS/AML) have been reported (rarely) in clinical trials.  Patient has anti-emetic on hand and knows to take it if nausea develops.   Patient will obtain anti diarrheal and alert the office of 4 or more loose stools above baseline.  Reviewed with patient importance of keeping a medication schedule and plan for any missed doses. No barriers to medication adherence identified.  Medication reconciliation performed and medication/allergy list updated.   Ms. BlTaxoiced understanding and appreciation. All questions answered.  Patient approved for manufacturer assistance for TaCain Saupe medication was delivered to patient on 07/15/20.  Provided patient with Oral [facility] number. Patient knows to call the office with questions or concerns. Oral Chemotherapy Navigation [facility] will continue to follow.  ReLeron CroakPharmD, BCPS Hematology/Oncology Clinical Pharmacist [phone]/01/2020 10:44 AM

## 2020-07-16 ENCOUNTER — Telehealth: Payer: Self-pay | Admitting: Oncology

## 2020-07-16 NOTE — Telephone Encounter (Signed)
Oral Oncology Patient Advocate Encounter  Met patient in [place] to complete re-enrollment application for [facility] in an effort to reduce patient's out of pocket expense for Talzenna to $0.    Application completed and faxed to [phone].   [employer] patient assistance phone number for follow up is [phone].   This encounter will be updated until final determination.   Josephine Patient [place] Phone [phone] Fax [phone] 07/16/2020 2:03 PM

## 2020-07-16 NOTE — Telephone Encounter (Signed)
Scheduled appts per 12/2 los. Pt confirmed appt date and time.

## 2020-07-17 ENCOUNTER — Telehealth: Payer: Self-pay | Admitting: *Deleted

## 2020-07-17 MED ORDER — DOXYCYCLINE HYCLATE 100 MG PO TABS: 100.0000 mg | ORAL_TABLET | Freq: Two times a day (BID) | ORAL | 0 refills | Status: DC

## 2020-07-17 MED ORDER — BENZONATATE 100 MG PO CAPS: 100.0000 mg | ORAL_CAPSULE | Freq: Two times a day (BID) | ORAL | 0 refills | Status: AC

## 2020-07-17 NOTE — Telephone Encounter (Signed)
This RN spoke with the patient per her call stating ongoing cough x 10 days with worsening in the past 2 days.  She states she developed a fever pm last night of 102.  She is able to smell and taste.  She started her Talzeena yesterday.  Per MD requested pt to hold the Talzeena - and get Covid tested asap- she needs to call us with results for further recommendations- including if positive referral for monoclonal therapy.  [name] verbalized understanding of above.

## 2020-07-17 NOTE — Telephone Encounter (Signed)
[name] called to this RN to report negative Covid test ( per at home test x 2 readings ).  Per Dr Jana Hakim prescriptions obtained for ABX and cough medications.  Above discussed with pt including if symptoms worsen to proceed to the ER - inform them she is a patient at this office and show them the chemo pill bottle so they know to fast track her.  [name] verbalized understanding per repeat back.

## 2020-07-18 ENCOUNTER — Telehealth: Payer: Self-pay | Admitting: *Deleted

## 2020-07-18 DIAGNOSIS — N179 Acute kidney failure, unspecified: Secondary | ICD-10-CM | POA: Diagnosis not present

## 2020-07-18 DIAGNOSIS — Z436 Encounter for attention to other artificial openings of urinary tract: Secondary | ICD-10-CM | POA: Diagnosis not present

## 2020-07-18 DIAGNOSIS — E440 Moderate protein-calorie malnutrition: Secondary | ICD-10-CM | POA: Diagnosis not present

## 2020-07-18 DIAGNOSIS — E860 Dehydration: Secondary | ICD-10-CM | POA: Diagnosis not present

## 2020-07-18 DIAGNOSIS — I129 Hypertensive chronic kidney disease with stage 1 through stage 4 chronic kidney disease, or unspecified chronic kidney disease: Secondary | ICD-10-CM | POA: Diagnosis not present

## 2020-07-18 DIAGNOSIS — N131 Hydronephrosis with ureteral stricture, not elsewhere classified: Secondary | ICD-10-CM | POA: Diagnosis not present

## 2020-07-18 NOTE — Telephone Encounter (Signed)
[name] called to this RN with update post onset of fever with cough.  She states she took 1 dose of the doxycycline around 6 pm yesterday - she states her temp went up to 103 at 1030 pm- she called for support and transportation and when her friend arrived around 11pm- her fever " broke and it was 100.7"  She states temps have now been " creeping around 98 to 100 ".  She states overall status has not changed except cough seems to be more frequent though still nonproductive.  Note received call as well from the [place] - who visited pt today- she states lung sounds are clear- but [facility] can do a portable CXR.  Per review with MD- pt should add muccinex DM to regimen- and CXR can be done.  This RN returned call to pt and discussed above - as well as CXR- she understands it is ok to decline the xray if her symptoms are very improved.  This RN also asked pt to call this RN in the am with update on status for possible further recommendation due to pending weekend and goal is to keep her out of the ER.  This RN called [name] at [phone] and informed her of above- she is hoping to have it done tomorrow but may not be for 2 days.  She states they will call before coming to verify pt is at home and still wants to proceed with xray.

## 2020-07-19 ENCOUNTER — Other Ambulatory Visit: Payer: Self-pay | Admitting: Oncology

## 2020-07-19 DIAGNOSIS — R509 Fever, unspecified: Secondary | ICD-10-CM | POA: Diagnosis not present

## 2020-07-19 MED ORDER — POTASSIUM CHLORIDE CRYS ER 10 MEQ PO TBCR: 20.0000 meq | EXTENDED_RELEASE_TABLET | Freq: Two times a day (BID) | ORAL | 6 refills | Status: AC

## 2020-07-19 NOTE — Progress Notes (Signed)
I called [name] just to catch up. She is still having cough, which is nonproductive. She thinks the Mucinex may be starting to "bring things up". The cough comes from her upper chest. When she falls asleep the cough does not wake her up.   She denies significant pain. She is taking Tylenol mostly for this. We discussed that she can take an occasional Aleve even though of course if she took a lot of nonsteroidals would hurt the kidneys.  She is tolerating the doxycycline well. She says her fever is now better controlled and her top temperature was 100.7 around midnight last night. She is feeling short of breath but is doing more and was working on horse diets calculations when I called  We will touch base again on 07/22/2020. We are not starting the park inhibitor until we have the cough and fever under better control.

## 2020-07-23 ENCOUNTER — Other Ambulatory Visit: Payer: Self-pay | Admitting: Oncology

## 2020-07-23 DIAGNOSIS — E860 Dehydration: Secondary | ICD-10-CM | POA: Diagnosis not present

## 2020-07-23 DIAGNOSIS — N1832 Chronic kidney disease, stage 3b: Secondary | ICD-10-CM | POA: Diagnosis not present

## 2020-07-23 DIAGNOSIS — M810 Age-related osteoporosis without current pathological fracture: Secondary | ICD-10-CM | POA: Diagnosis not present

## 2020-07-23 DIAGNOSIS — E440 Moderate protein-calorie malnutrition: Secondary | ICD-10-CM | POA: Diagnosis not present

## 2020-07-23 DIAGNOSIS — C50919 Malignant neoplasm of unspecified site of unspecified female breast: Secondary | ICD-10-CM | POA: Diagnosis not present

## 2020-07-23 DIAGNOSIS — N179 Acute kidney failure, unspecified: Secondary | ICD-10-CM | POA: Diagnosis not present

## 2020-07-23 DIAGNOSIS — F419 Anxiety disorder, unspecified: Secondary | ICD-10-CM | POA: Diagnosis not present

## 2020-07-23 DIAGNOSIS — F32A Depression, unspecified: Secondary | ICD-10-CM | POA: Diagnosis not present

## 2020-07-23 DIAGNOSIS — Z436 Encounter for attention to other artificial openings of urinary tract: Secondary | ICD-10-CM | POA: Diagnosis not present

## 2020-07-23 DIAGNOSIS — G620 Drug-induced polyneuropathy: Secondary | ICD-10-CM | POA: Diagnosis not present

## 2020-07-23 DIAGNOSIS — Z85048 Personal history of other malignant neoplasm of rectum, rectosigmoid junction, and anus: Secondary | ICD-10-CM | POA: Diagnosis not present

## 2020-07-23 DIAGNOSIS — D539 Nutritional anemia, unspecified: Secondary | ICD-10-CM | POA: Diagnosis not present

## 2020-07-23 DIAGNOSIS — C7951 Secondary malignant neoplasm of bone: Secondary | ICD-10-CM | POA: Diagnosis not present

## 2020-07-23 DIAGNOSIS — Z933 Colostomy status: Secondary | ICD-10-CM | POA: Diagnosis not present

## 2020-07-23 DIAGNOSIS — T451X5D Adverse effect of antineoplastic and immunosuppressive drugs, subsequent encounter: Secondary | ICD-10-CM | POA: Diagnosis not present

## 2020-07-23 DIAGNOSIS — N131 Hydronephrosis with ureteral stricture, not elsewhere classified: Secondary | ICD-10-CM | POA: Diagnosis not present

## 2020-07-23 DIAGNOSIS — K219 Gastro-esophageal reflux disease without esophagitis: Secondary | ICD-10-CM | POA: Diagnosis not present

## 2020-07-23 DIAGNOSIS — I129 Hypertensive chronic kidney disease with stage 1 through stage 4 chronic kidney disease, or unspecified chronic kidney disease: Secondary | ICD-10-CM | POA: Diagnosis not present

## 2020-07-23 MED ORDER — PROMETHAZINE-CODEINE 6.25-10 MG/5ML PO SYRP: 5.0000 mL | ORAL_SOLUTION | Freq: Four times a day (QID) | ORAL | 0 refills | Status: AC | PRN

## 2020-07-26 DIAGNOSIS — E860 Dehydration: Secondary | ICD-10-CM | POA: Diagnosis not present

## 2020-07-26 DIAGNOSIS — I129 Hypertensive chronic kidney disease with stage 1 through stage 4 chronic kidney disease, or unspecified chronic kidney disease: Secondary | ICD-10-CM | POA: Diagnosis not present

## 2020-07-26 DIAGNOSIS — Z436 Encounter for attention to other artificial openings of urinary tract: Secondary | ICD-10-CM | POA: Diagnosis not present

## 2020-07-26 DIAGNOSIS — N179 Acute kidney failure, unspecified: Secondary | ICD-10-CM | POA: Diagnosis not present

## 2020-07-26 DIAGNOSIS — N131 Hydronephrosis with ureteral stricture, not elsewhere classified: Secondary | ICD-10-CM | POA: Diagnosis not present

## 2020-07-26 DIAGNOSIS — E440 Moderate protein-calorie malnutrition: Secondary | ICD-10-CM | POA: Diagnosis not present

## 2020-07-28 NOTE — Progress Notes (Incomplete)
[place]  [phone] [phone]     ID: CORDELL COKE DOB: [birth year]  MR#: [id number]  SHF#:[id number]  Patient Care Team: Celene Squibb, MD as PCP - General (Internal Medicine) Aurea Graff OTHER MD:  I connected with Teofilo Pod on 07/28/20 at 11:30 AM EST by video enabled telemedicine visit and verified that I am speaking with the correct person using two identifiers.   I discussed the limitations, risks, security and privacy concerns of performing an evaluation and management service by telemedicine and the availability of in-person appointments. I also discussed with the patient that there may be a patient responsible charge related to this service. The patient expressed understanding and agreed to proceed.   Other persons participating in the visit and their role in the encounter: Patient's Sister Dawn ***  Patient's location: Home Provider's location: [place]: Metastatic breast cancer  CURRENT TREATMENT: To start talazoparib   INTERVAL HISTORY: Deseree was contacted today for follow up of her history of metastatic breast cancer.  Since her last visit here she had a PET scan, on 07/09/2020, showing very low level hypermetabolic activity in some lymph nodes, particularly left cervical and right subpectoral, with more important hypermetabolic pleura based lesions in the right hemithorax and posterior medial left hemithorax.  There was sclerotic changes at L1 and L3 but these were not hypermetabolic.  There was no evidence of liver involvement.    REVIEW OF SYSTEMS: [place]    COVID 97 VACCINATION STATUS:    HISTORY OF CURRENT ILLNESS: From Robynn Pane, PA-C note on 09/24/2016:  "Teofilo Pod 69 y.o. female returns for followup of Stage IV breast cancer ER/PR positive, HER-2/neu negative. BRCA2 POSITIVE. History of stage IIIc left breast cancer in 2009 treated with neoadjuvant  chemotherapy consisting of epirubicin and Cytoxan followed by 4 cycles of docetaxel from 04/16/2008-08/13/2008 with the initiation of Herceptin for 52 weeks at the start of docetaxel. She then underwent a left mastectomy on 08/30/2008 showing persistent disease in the breast and lymph nodes. This was followed by radiation therapy from 09/17/2008-11/26/2008 to the left chest wall, supraclavicular, and axillary node areas. She then moved on to antiestrogen therapy with letrozole (11/27/2008-04/20/2011). She was found to have recurrent breast cancer on supraclavicular lymph node biopsy on 03/30/2011 leading to a change in antiestrogen therapy to tamoxifen (04/21/2011-06/04/2014). Pet imaging on 05/25/2014 demonstrated progression of left submandibular lymph node, mediastinal lymph node, and probable lung involvement. This was followed by a biopsy on 06/13/2014 of a left supraclavicular mass that did reveal metastatic carcinoma consistent with breast primary. She was then treated with carboplatin and Taxol for 6 cycles with an excellent response (06/25/2014-10/09/2014). She was then transition back to hormone therapy consisting of Ibrance and Faslodex (11/13/2014-06/10/2015). This was complicated by grade 3 neutropenia with cycle #2 resulting in a dose reduction of Ibrance to 100 mg for cycle 3. Unfortunately, on 06/10/2015, she was noted to have progression of disease in the right mid cervical lymph node over a 4 day period that was PET avid. Therapy was therefore changed to exemestane and everolimus (50/27/7412-87/86/7672) that was complicated by an adverse reaction consisting of diffuse pneumonitis. This regimen was therefore discontinued. She then transitioned back to systemic chemotherapy consisting of cisplatin every 14 days (08/23/2015-12/23/2015). She was put on a drug holiday beginning on 12/23/2015 in preparation for a trip to [place] on 01/25/2016. She was on Xeloda maintenance in a 7 day on  and 7 day off  fashion beginning on 04/20/2016, but progression of disease was noted in cervical lymph node.  She is NOW on [place] beginning on 08/26/2016. AND squamous cell carcinoma of anus, HPV POSITIVE, S/P XRT with Xeloda concomitantly in the neoadjuvant setting (02/07/2016- 03/10/2016).  S/P APR by Dr. Leighton Ruff on 3/81/0175, demonstrating a partial response to therapy, Stage IIA (T2N0M0).  Initial medical oncology recommendations provided by [facility]. Tressie Stalker at [facility]"  The patient's subsequent history is as detailed below.   PAST MEDICAL HISTORY: Past Medical History:  Diagnosis Date  . Adenocarcinoma of breast ([place]) 01/27/2011   stage IIIc breast cancer  . Bladder infection   . Bone metastasis ([place]) 01/17/2016  . Borderline hypertension   . Breast cancer ([place])   . Closed fracture of humerus sept 2013  . Depression   . Family history of adverse reaction to anesthesia    father remembered his surgery  . Fatigue   . GERD (gastroesophageal reflux disease)   . Hypertension   . 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 Osteoporosis 01/27/2011  . Pneumonia   . Port catheter in place 04/14/2013  . Post-mastectomy lymphedema syndrome   . 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 Reflux   . Retinal detachment   . 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 Swollen lymph nodes   . URI (upper respiratory infection) 02/17/12  . Wears glasses     PAST SURGICAL HISTORY: Past Surgical History:  Procedure Laterality Date  . ABDOMINAL PERINEAL BOWEL RESECTION N/A 09/01/2016   Procedure: ABDOMINAL PERINEAL RESECTION PERMANENT COLOSTOMY;  Surgeon: Leighton Ruff, MD;  Location: [facility];  Service: General;  Laterality: N/A;  . biopsy of lymph node     super clavicle  . BTL    . CATARACT EXTRACTION    . cervical disc fusioni    . CYSTOSCOPY W/ URETERAL STENT PLACEMENT Bilateral 06/03/2020   Procedure: CYSTOSCOPY WITH RETROGRADE  PYELOGRAM/URETERAL STENT PLACEMENT bladder biopsy;  Surgeon: [name], MD;  Location: [facility];  Service: Urology;  Laterality: Bilateral;  . CYSTOSCOPY WITH STENT PLACEMENT Left 06/07/2020   Procedure: CYSTOSCOPY LEFT STENT EXCHANGE, retrograde pylogram;  Surgeon: [name], MD;  Location: [facility];  Service: Urology;  Laterality: Left;  . IR NEPHROSTOMY EXCHANGE LEFT  06/26/2020  . IR NEPHROSTOMY PLACEMENT LEFT  06/17/2020  . IR RADIOLOGIST EVAL & MGMT  07/02/2020  . IR TRANSCATH RETRIEVAL FB INCL GUIDANCE (MS)  06/26/2020  . LYMPH NODE BIOPSY Right 06/19/2015   Procedure: RIGHT NECK LYMPH NODE EXCISION;  Surgeon: Rolm Bookbinder, MD;  Location: [place];  Service: General;  Laterality: Right;  . LYMPH NODE DISSECTION Left 04/26/2017   Procedure: EXCISION LEFT NECK LYMPH NODE;  Surgeon: Rolm Bookbinder, MD;  Location: [place];  Service: General;  Laterality: Left;  Marland Kitchen MASS EXCISION Left 07/27/2016   Procedure: EXCISION OF POSTERIOR NECK MASS;  Surgeon: Rolm Bookbinder, MD;  Location: [place];  Service: General;  Laterality: Left;  Marland Kitchen MASTECTOMY MODIFIED RADICAL     left breast  . Port- a-cath insertion    . retinal detachment and repair      FAMILY HISTORY: Family History  Problem Relation Age of Onset  . Hypertension Mother   . Heart disease Mother        atrial fib  . Heart disease Father   . Other  Father        glaucoma      GYNECOLOGIC HISTORY:     SOCIAL HISTORY: (updated 07/2020)  Katharine Look    ADVANCED DIRECTIVES:    HEALTH MAINTENANCE: Social History   Tobacco Use  . Smoking status: Never Smoker  . Smokeless tobacco: Never Used  Vaping Use  . Vaping Use: Never used  Substance Use Topics  . Alcohol use: Yes    Alcohol/week: 7.0 standard drinks    Types: 7 Glasses of wine per week    Comment: 1 drink daily  . Drug use: Yes    Types: Marijuana    Comment: During chemo, none over 1 Year     Colonoscopy:    PAP:   Bone density:    Allergies  Allergen Reactions  . Ciprofloxacin Other (See Comments)    tendonitis  . Prochlorperazine Anxiety    Other reaction(s): Confusion    Current Outpatient Medications  Medication Sig Dispense Refill  . acetaminophen (TYLENOL) 500 MG tablet Take 1,000 mg by mouth every 6 (six) hours as needed for moderate pain or headache.    . benzonatate (TESSALON) 100 MG capsule Take 1 capsule (100 mg total) by mouth 2 (two) times daily. 30 capsule 0  . buPROPion (WELLBUTRIN SR) 100 MG 12 hr tablet Take 100 mg by mouth 2 (two) times daily.    Marland Kitchen CALCIUM-VITAMIN D PO Take 1 tablet by mouth 2 (two) times daily.     Marland Kitchen denosumab (XGEVA) 120 MG/1.7ML SOLN injection Inject 120 mg into the skin every 3 (three) months.    . docusate sodium (COLACE) 100 MG capsule Take 1 capsule (100 mg total) by mouth daily. 10 capsule 0  . doxycycline (VIBRA-TABS) 100 MG tablet Take 1 tablet (100 mg total) by mouth 2 (two) times daily. 14 tablet 0  . metoprolol succinate (TOPROL-XL) 25 MG 24 hr tablet Take 25 mg by mouth in the morning and at bedtime.   5  . Multiple Vitamin (MULTIVITAMIN WITH MINERALS) TABS tablet Take 1 tablet by mouth daily. 90 tablet 0  . omeprazole (PRILOSEC) 40 MG capsule Take 1 capsule (40 mg total) by mouth 2 (two) times daily. (Patient taking differently: Take 40 mg by mouth in the morning and at bedtime. ) 180 capsule 4  . polyethylene glycol (MIRALAX / GLYCOLAX) 17 g packet Take 17 g by mouth daily. 14 each 0  . potassium chloride (KLOR-CON) 10 MEQ tablet Take 2 tablets (20 mEq total) by mouth 2 (two) times daily. 120 tablet 6  . prochlorperazine (COMPAZINE) 5 MG tablet Take 1 tablet (5 mg total) by mouth 4 (four) times daily -  before meals and at bedtime. 90 tablet 4  . promethazine-codeine (PHENERGAN WITH CODEINE) 6.25-10 MG/5ML syrup Take 5 mLs by mouth every 6 (six) hours as needed for cough. 120 mL 0  . sodium chloride 0.9 % infusion Inject 1,000 mLs into the  vein daily. 30000 mL 0  . talazoparib tosylate (TALZENNA) 0.25 MG capsule Take 2 capsules (0.5 mg total) by mouth daily. To start 07/01/2020 60 capsule 3  . vitamin B-12 (CYANOCOBALAMIN) 1000 MCG tablet Take 1,000 mcg by mouth 2 (two) times daily.      No current facility-administered medications for this visit.    OBJECTIVE:   There were no vitals filed for this visit.   There is no height or weight on file to calculate BMI.   Wt Readings from Last 3 Encounters:  07/09/20 120 lb (54.4 kg)  06/17/20 123 lb 4.8 oz (55.9 kg)  06/07/20 132 lb (59.9 kg)      ECOG FS:  Telemedicine visit 07/29/2020   LAB RESULTS:  CMP     Component Value Date/Time   NA 139 06/22/2020 0311   K 3.9 06/22/2020 0311   CL 107 06/22/2020 0311   CO2 22 06/22/2020 0311   GLUCOSE 92 06/22/2020 0311   BUN 14 06/22/2020 0311   CREATININE 1.72 (H) 06/22/2020 0311   CALCIUM 7.9 (L) 06/22/2020 0311   PROT 6.3 (L) 06/20/2020 0841   ALBUMIN 2.8 (L) 06/22/2020 0311   AST 14 (L) 06/20/2020 0841   ALT 7 06/20/2020 0841   ALKPHOS 57 06/20/2020 0841   BILITOT 0.4 06/20/2020 0841   GFRNONAA 32 (L) 06/22/2020 0311   GFRAA >60 10/07/2016 0834    No results found for: TOTALPROTELP, ALBUMINELP, A1GS, A2GS, BETS, BETA2SER, GAMS, MSPIKE, SPEI  Lab Results  Component Value Date   WBC 5.5 06/20/2020   NEUTROABS 10.0 (H) 06/17/2020   HGB 8.2 (L) 06/20/2020   HCT 25.3 (L) 06/20/2020   MCV 102.4 (H) 06/20/2020   PLT 315 06/20/2020    Lab Results  Component Value Date   LABCA2 33.2 08/11/2016    No components found for: [id number]  No results for input(s): INR in the last 168 hours.  Lab Results  Component Value Date   LABCA2 33.2 08/11/2016    No results found for: CAN199  No results found for: WUJ811  Lab Results  Component Value Date   CAN153 26.4 (H) 08/11/2016    No results found for: CA2729  No components found for: HGQUANT  No results found for: CEA1 / No results found for:  CEA1   No results found for: AFPTUMOR  No results found for: CHROMOGRNA  No results found for: KPAFRELGTCHN, LAMBDASER, KAPLAMBRATIO (kappa/lambda light chains)  No results found for: HGBA, HGBA2QUANT, HGBFQUANT, HGBSQUAN (Hemoglobinopathy evaluation)   Lab Results  Component Value Date   LDH 176 04/27/2008    Lab Results  Component Value Date   IRON 79 10/07/2016   TIBC 277 10/07/2016   IRONPCTSAT 28 10/07/2016   (Iron and TIBC)  Lab Results  Component Value Date   FERRITIN 156 10/07/2016    Urinalysis    Component Value Date/Time   COLORURINE STRAW (A) 06/03/2020 1613   APPEARANCEUR CLEAR 06/03/2020 1613   LABSPEC 1.008 06/03/2020 1613   PHURINE 6.0 06/03/2020 1613   GLUCOSEU NEGATIVE 06/03/2020 1613   [place] 06/03/2020 1613   BILIRUBINUR NEGATIVE 06/03/2020 1613   KETONESUR NEGATIVE 06/03/2020 [place] 06/03/2020 1613   UROBILINOGEN 0.2 08/06/2008 1500   NITRITE NEGATIVE 06/03/2020 1613   LEUKOCYTESUR NEGATIVE 06/03/2020 1613     STUDIES: NM PET Image Restag (PS) Skull Base To Thigh  Result Date: 07/10/2020 CLINICAL DATA:  Subsequent treatment strategy for breast cancer. EXAM: NUCLEAR MEDICINE PET SKULL BASE TO THIGH TECHNIQUE: 5.9 mCi F-18 FDG was injected intravenously. Full-ring PET imaging was performed from the skull base to thigh after the radiotracer. CT data was obtained and used for attenuation correction and anatomic localization. Fasting blood glucose: 98 mg/dl COMPARISON:  08/10/2019 FINDINGS: Mediastinal blood pool activity: SUV max 2.6 Liver activity: SUV max NA NECK: Similar hypermetabolism again identified in the neck bilaterally. Level II left-sided node not discretely measurable, but obscuration of fat planes visible on image 19/4. SUV max = 3.7 today compared to 4.3 previously. Similar low level uptake on the right in the  level II region. Incidental CT findings: none CHEST: Similar low level uptake identified in the  right subpectoral region corresponding to amorphous soft tissue visible on 58/4. SUV max = 3.1 today compared to 3.8 previously. Low level FDG accumulation identified in sub pleural consolidative opacity in the anterior left upper lobe, similar to prior and most likely treatment related. A new hypermetabolic focus is identified between the descending aorta and heart on image 82/4. SUV max = 4.6. Unclear if this is mediastinal or pulmonary although pleural based pulmonary etiology suspected as soft tissue tracks from this location down along the pleura posterior to the heart (see image 88/4). A second new, similar subpleural/pleural plaque-like soft tissue lesion in the anteromedial right hemithorax (91/4) demonstrates SUV max = 4.3. Incidental CT findings: Interlobular septal thickening in the right apex is similar to prior. Stable 6-7 mm right upper lobe pulmonary nodule on 64/4 without hypermetabolism. Right Port-A-Cath tip is positioned in the distal SVC. Coronary artery calcification is evident. Atherosclerotic calcification is noted in the wall of the thoracic aorta. ABDOMEN/PELVIS: No abnormal hypermetabolic activity within the liver, pancreas, adrenal glands, or spleen. No hypermetabolic lymph nodes in the abdomen or pelvis. Similar appearance of low level uptake in 10 presume treatment related presacral soft tissue attenuation. Incidental CT findings: Bilateral internal ureteral stents evident with left percutaneous nephrostomy catheter identified. Mild right hydronephrosis noted without evidence for left hydronephrosis. Left abdominal colostomy again noted. SKELETON: No focal hypermetabolic activity to suggest skeletal metastasis. Incidental CT findings: Similar sclerotic changes in the L1 and L3 vertebral bodies without hypermetabolic FDG uptake. IMPRESSION: 1. No substantial change in hypermetabolic level II cervical lymph nodes and right subpectoral soft tissue. Imaging features remain concerning for  metastatic involvement. 2. Interval development of hypermetabolic pleural based lesions in the anteromedial right hemithorax and posteromedial left hemithorax. Imaging features concerning for new metastatic disease. 3. Similar low level FDG accumulation in treatment related presacral soft tissue thickening. 4.  Aortic Atherosclerois (ICD10-170.0) Electronically Signed   By: Kennith Center M.D.   On: 07/10/2020 09:16   IR Radiologist Eval & Mgmt  Result Date: 07/02/2020 Please refer to notes tab for details about interventional procedure. (Op Note)    ELIGIBLE FOR AVAILABLE RESEARCH PROTOCOL: *no  ASSESSMENT: 69 y.o. [place] woman with stage IV BRCA2 positive breast cancer, as follows:  (1) left axillary nodal mass biopsy 04/09/2008 shows breast cancer, estrogen receptor 100% positive, progesterone receptor 5% positive, HER-2/neu amplified,             (a) staging studies showed stage IIIc disease  (2) received epirubicin/cyclophosphamide x4 dose dense followed by docetaxel x4 between 04/16/2008 and 08/13/2018             (a) trastuzumab started with docetaxel and continued for 52 weeks  (3) left mastectomy 08/30/2008 showed persistent disease in the breast and lymph nodes.  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  (8) carboplatin and paclitaxel x6 cycles with good response (06/25/2014 through 10/09/2014)  (9) palbociclib and Faslodex  11/13/2014, discontinued 06/10/2015 with progression               (a) right cervical lymph node biopsy BRCA2 positive  (10) exemestane and everolimus 07/02/2015 through 08/01/2015             (a) possible pneumonitis from everolimus  (11) denosumab/Xgeva started December 2016  (12) cisplatin (40 mg/m every other week) 08/23/2015 through 12/23/2015 discontinued due to patient's desire for drug holiday  (13) olaparib 08/26/2016 through 09/08/2016--poorly tolerated             (a) ANAL CANCER resected 09/01/2016  (14) Megace 10/12/2016 through 05/11/2017, discontinued with progression             (a) repeat left posterior cervical chain node biopsy again estrogen receptor positive now progesterone receptor as well as HER-2 negative  (15) eribulin 06/01/2017 through 11/23/2017  (16) toremifene 02/11/2018 through 10/05/2018, discontinued with progression  (17) Doxil 10/18/2018 through 04/03/2019  (18) pembrolizumab 08/21/2019 through 10/22/2019  (18) carboplatin and paclitaxel 10/24/2019 through 02/23/2020  (19) to start talazoparib 07/15/2020   PLAN: I spoke today with Katharine Look and her sister today and we reviewed the PET scan.  It gives [place] a new baseline.  While there is significant disease I do not see anything that is immediately life-threatening.  She does have some cough and shortness of breath but otherwise her functional status is ECOG 2.  Her past treatment history is extensive as noted above.  There is really not a lot of room there for much except repetition which is unlikely to be helpful.  She tolerated olaparib very poorly but only took it for a few days.  Given her BRCA positivity I think it is reasonable to give PARP inhibitors a second try.  Today we discussed the possible toxicities side effects and complications of talazoparib.  We have not been able to obtain this yet for her but I was told today by pharmacy that they expect within the next few days it may be being shipped to her home.  Whenever she does receive it we  are going to start at a low dose, namely 2 tablets daily just to see how she tolerates it.  If she tolerates it well after a week or 2 we can consider upping the dose  Tentatively I am is scheduling her for a repeat virtual visit in about 3 weeks.  Adelyna has a good understanding of the overall plan. She agrees with it. She knows the goal of treatment in her case is cure. She will call with any problems that may develop before her next visit here.   Virgie Dad. Magrinat, MD 07/28/2020 3:55 AM Medical Oncology and Hematology [facility] [place], [place] [address] Tel. [phone]    Fax. [phone]   This document serves as a record of services personally performed by Lurline Del, MD. It was created on his behalf by Wilburn Mylar, a trained medical scribe. The creation of this record is based on the scribe's personal observations and the provider's statements to them.   I, Lurline Del MD, have reviewed the above documentation for accuracy and completeness, and I agree with the above.   *Total Encounter Time as defined by the Centers for Medicare and Medicaid Services includes, in addition to the face-to-face time of a patient visit (documented in the note above) non-face-to-face time: obtaining and reviewing outside history, ordering and reviewing medications, tests or procedures, care coordination (communications with other health care  professionals or caregivers) and documentation in the medical record.

## 2020-07-29 ENCOUNTER — Other Ambulatory Visit: Payer: Self-pay | Admitting: Oncology

## 2020-07-29 ENCOUNTER — Inpatient Hospital Stay: Payer: Medicare Other | Admitting: Oncology

## 2020-07-29 DIAGNOSIS — N1330 Unspecified hydronephrosis: Secondary | ICD-10-CM | POA: Diagnosis not present

## 2020-07-29 DIAGNOSIS — R1084 Generalized abdominal pain: Secondary | ICD-10-CM | POA: Diagnosis not present

## 2020-07-29 DIAGNOSIS — I700 Atherosclerosis of aorta: Secondary | ICD-10-CM | POA: Diagnosis not present

## 2020-07-29 DIAGNOSIS — Z853 Personal history of malignant neoplasm of breast: Secondary | ICD-10-CM | POA: Diagnosis not present

## 2020-07-29 DIAGNOSIS — R110 Nausea: Secondary | ICD-10-CM | POA: Diagnosis not present

## 2020-07-29 DIAGNOSIS — N130 Hydronephrosis with ureteropelvic junction obstruction: Secondary | ICD-10-CM | POA: Diagnosis not present

## 2020-07-29 NOTE — Progress Notes (Unsigned)
Center had to cancel her appointment here today because she had pain in the abdomen which was immobilizing her.  She went to see Dr. Lovena Neighbours.  He obtained a CT of the abdomen which showed no obvious cause of her pain.  See his note below together with the CT report.    He started her on Norco, which does work, but puts her to sleep.  If she took Norco 3 times a day she feels she would sleep all day.  She says tramadol and Tylenol do not work.  We are going to see if gabapentin is useful.  She will take 300 mg 4 times a day and she will call us tomorrow to let us know how that is going.  I really do not want to start her PARP inhibitor until we have the pain a little better controlled as I will not be able otherwise to separate the different reasons for her symptoms.  ******   Juluis Rainier, I saw Ms. [name] in the office on 07/29/2020 due to worsening right-sided flank pain and low urine output. Her CT scan (results listed below) showed that her stents and left nephrostomy tube are in good position. Her serum creatinine came back at 1.6, which is essentially baseline for her. I do not have a clear urologic source for her ongoing pain. I did prescribe her a short course of Norco, but it sounds like she may need a more long-term pain management plan.   Thanks,   Aleen Campi, MD  [facility]    CLINICAL DATA: Right flank pain for 3 days. Nausea. Personal  history of rectal carcinoma and left breast carcinoma.    EXAM:  CT ABDOMEN AND PELVIS WITHOUT CONTRAST    TECHNIQUE:  Multidetector CT imaging of the abdomen and pelvis was performed  following the standard protocol without IV contrast.    COMPARISON: 06/17/2020    FINDINGS:  Lower chest: No acute findings.    Hepatobiliary: No mass visualized on this unenhanced exam.  Gallbladder is unremarkable. No evidence of biliary ductal  dilatation.    Pancreas: No mass or inflammatory process visualized on this  unenhanced  exam.    Spleen: Within normal limits in size.    Adrenals/Urinary tract: Bilateral internal ureteral stents remain in  appropriate position. A new left percutaneous nephrostomy tube is  seen in place with resolution mild left hydronephrosis since prior  exam. Mild right renal pelvicaliectasis remains stable. No evidence  of urolithiasis. Urinary bladder is nearly completely empty.    Stomach/Bowel: Stable distal colectomy with left lower quadrant  colostomy. Stable presacral soft tissue density. No evidence of  obstruction, inflammatory process, or abnormal fluid collections.    Vascular/Lymphatic: No pathologically enlarged lymph nodes  identified. No evidence of abdominal aortic aneurysm. Aortic  atherosclerotic calcification noted.    Reproductive: Prior hysterectomy again noted.    Other: None.    Musculoskeletal: No suspicious bone lesions identified.    IMPRESSION:  No acute findings.    New left percutaneous nephrostomy tube, with resolution of mild left  hydronephrosis since prior exam.    Bilateral internal ureteral stents remain in appropriate position.    Stable presacral soft tissue density, consistent with post treatment  changes. No evidence of recurrent or metastatic carcinoma within the  abdomen or pelvis.      Electronically Signed  By: Marlaine Hind M.D.  On: 07/29/2020 15:54

## 2020-07-31 DIAGNOSIS — Z1289 Encounter for screening for malignant neoplasm of other sites: Secondary | ICD-10-CM | POA: Diagnosis not present

## 2020-07-31 DIAGNOSIS — C7951 Secondary malignant neoplasm of bone: Secondary | ICD-10-CM | POA: Diagnosis not present

## 2020-07-31 DIAGNOSIS — R7309 Other abnormal glucose: Secondary | ICD-10-CM | POA: Diagnosis not present

## 2020-07-31 DIAGNOSIS — C50919 Malignant neoplasm of unspecified site of unspecified female breast: Secondary | ICD-10-CM | POA: Diagnosis not present

## 2020-07-31 DIAGNOSIS — R109 Unspecified abdominal pain: Secondary | ICD-10-CM | POA: Diagnosis not present

## 2020-07-31 DIAGNOSIS — Z23 Encounter for immunization: Secondary | ICD-10-CM | POA: Diagnosis not present

## 2020-07-31 DIAGNOSIS — R0602 Shortness of breath: Secondary | ICD-10-CM | POA: Diagnosis not present

## 2020-07-31 DIAGNOSIS — J918 Pleural effusion in other conditions classified elsewhere: Secondary | ICD-10-CM | POA: Diagnosis not present

## 2020-07-31 DIAGNOSIS — K219 Gastro-esophageal reflux disease without esophagitis: Secondary | ICD-10-CM | POA: Diagnosis not present

## 2020-07-31 DIAGNOSIS — Z6821 Body mass index (BMI) 21.0-21.9, adult: Secondary | ICD-10-CM | POA: Diagnosis not present

## 2020-07-31 DIAGNOSIS — I10 Essential (primary) hypertension: Secondary | ICD-10-CM | POA: Diagnosis not present

## 2020-07-31 DIAGNOSIS — R944 Abnormal results of kidney function studies: Secondary | ICD-10-CM | POA: Diagnosis not present

## 2020-08-01 DIAGNOSIS — N179 Acute kidney failure, unspecified: Secondary | ICD-10-CM | POA: Diagnosis not present

## 2020-08-01 DIAGNOSIS — Z436 Encounter for attention to other artificial openings of urinary tract: Secondary | ICD-10-CM | POA: Diagnosis not present

## 2020-08-01 DIAGNOSIS — E860 Dehydration: Secondary | ICD-10-CM | POA: Diagnosis not present

## 2020-08-01 DIAGNOSIS — E440 Moderate protein-calorie malnutrition: Secondary | ICD-10-CM | POA: Diagnosis not present

## 2020-08-01 DIAGNOSIS — I129 Hypertensive chronic kidney disease with stage 1 through stage 4 chronic kidney disease, or unspecified chronic kidney disease: Secondary | ICD-10-CM | POA: Diagnosis not present

## 2020-08-01 DIAGNOSIS — N131 Hydronephrosis with ureteral stricture, not elsewhere classified: Secondary | ICD-10-CM | POA: Diagnosis not present

## 2020-08-05 ENCOUNTER — Telehealth: Payer: Self-pay | Admitting: *Deleted

## 2020-08-05 DIAGNOSIS — R059 Cough, unspecified: Secondary | ICD-10-CM

## 2020-08-05 DIAGNOSIS — Z1502 Genetic susceptibility to malignant neoplasm of ovary: Secondary | ICD-10-CM

## 2020-08-05 DIAGNOSIS — C50919 Malignant neoplasm of unspecified site of unspecified female breast: Secondary | ICD-10-CM

## 2020-08-05 NOTE — Telephone Encounter (Signed)
This RN spoke with pt today per her call with update on pain management - note pt was prescribed gabapentin 300 mg qid with good pain control and function last week.  She states by Friday she began to have breakthrough pain primarily when she stands up and moves around.   She states pain seems to worsen later in the day even with reclining.  Pt has hydrocodone in the home but is reluctant to use due to severe somnolence.  She states also she is continuing to cough despite use of tessalon Perles or phenergan with codeine elixir.  Informed her above will be reviewed with covering provider- as she can take the hydrocodone if needed with the gabapentin- and may find taking 1/2 tablet beneficial with less sleepiness.

## 2020-08-05 NOTE — Telephone Encounter (Signed)
Per review with [facility]/NP - order placed for CXR to evaluate ongoing,unresolved cough.  Pt aware of above.

## 2020-08-06 ENCOUNTER — Other Ambulatory Visit: Payer: Self-pay

## 2020-08-06 ENCOUNTER — Other Ambulatory Visit (HOSPITAL_COMMUNITY): Payer: Medicare Other

## 2020-08-06 ENCOUNTER — Ambulatory Visit (HOSPITAL_COMMUNITY)
Admission: RE | Admit: 2020-08-06 | Discharge: 2020-08-06 | Disposition: A | Discharge status: Home / Self Care | Payer: Medicare Other | Source: Ambulatory Visit | Attending: Oncology | Admitting: Oncology

## 2020-08-06 DIAGNOSIS — C50919 Malignant neoplasm of unspecified site of unspecified female breast: Secondary | ICD-10-CM | POA: Diagnosis not present

## 2020-08-06 DIAGNOSIS — Z1502 Genetic susceptibility to malignant neoplasm of ovary: Secondary | ICD-10-CM | POA: Diagnosis not present

## 2020-08-06 DIAGNOSIS — R059 Cough, unspecified: Secondary | ICD-10-CM | POA: Diagnosis not present

## 2020-08-06 DIAGNOSIS — Z1509 Genetic susceptibility to other malignant neoplasm: Secondary | ICD-10-CM | POA: Insufficient documentation

## 2020-08-06 DIAGNOSIS — J90 Pleural effusion, not elsewhere classified: Secondary | ICD-10-CM | POA: Diagnosis not present

## 2020-08-07 ENCOUNTER — Other Ambulatory Visit: Payer: Self-pay | Admitting: *Deleted

## 2020-08-07 DIAGNOSIS — R7309 Other abnormal glucose: Secondary | ICD-10-CM | POA: Diagnosis not present

## 2020-08-07 DIAGNOSIS — Z6821 Body mass index (BMI) 21.0-21.9, adult: Secondary | ICD-10-CM | POA: Diagnosis not present

## 2020-08-07 DIAGNOSIS — R944 Abnormal results of kidney function studies: Secondary | ICD-10-CM | POA: Diagnosis not present

## 2020-08-07 DIAGNOSIS — R109 Unspecified abdominal pain: Secondary | ICD-10-CM | POA: Diagnosis not present

## 2020-08-07 DIAGNOSIS — I10 Essential (primary) hypertension: Secondary | ICD-10-CM | POA: Diagnosis not present

## 2020-08-07 DIAGNOSIS — K219 Gastro-esophageal reflux disease without esophagitis: Secondary | ICD-10-CM | POA: Diagnosis not present

## 2020-08-07 DIAGNOSIS — Z85038 Personal history of other malignant neoplasm of large intestine: Secondary | ICD-10-CM | POA: Diagnosis not present

## 2020-08-07 DIAGNOSIS — C7951 Secondary malignant neoplasm of bone: Secondary | ICD-10-CM | POA: Diagnosis not present

## 2020-08-07 DIAGNOSIS — J918 Pleural effusion in other conditions classified elsewhere: Secondary | ICD-10-CM | POA: Diagnosis not present

## 2020-08-07 DIAGNOSIS — Z1289 Encounter for screening for malignant neoplasm of other sites: Secondary | ICD-10-CM | POA: Diagnosis not present

## 2020-08-07 DIAGNOSIS — C50919 Malignant neoplasm of unspecified site of unspecified female breast: Secondary | ICD-10-CM | POA: Diagnosis not present

## 2020-08-07 DIAGNOSIS — R0602 Shortness of breath: Secondary | ICD-10-CM | POA: Diagnosis not present

## 2020-08-07 MED ORDER — DOXYCYCLINE HYCLATE 100 MG PO TABS: 100.0000 mg | ORAL_TABLET | Freq: Two times a day (BID) | ORAL | 0 refills | Status: DC

## 2020-08-07 NOTE — Telephone Encounter (Signed)
Per review of Xray - recommendation to resume abx- doxicycline per [facility]/NP.  This RN called pt and discussed above with verbalized understanding by pt.  Prescription sent to verified pharmacy.

## 2020-08-08 DIAGNOSIS — N130 Hydronephrosis with ureteropelvic junction obstruction: Secondary | ICD-10-CM | POA: Diagnosis not present

## 2020-08-09 DIAGNOSIS — E860 Dehydration: Secondary | ICD-10-CM | POA: Diagnosis not present

## 2020-08-09 DIAGNOSIS — N179 Acute kidney failure, unspecified: Secondary | ICD-10-CM | POA: Diagnosis not present

## 2020-08-09 DIAGNOSIS — Z436 Encounter for attention to other artificial openings of urinary tract: Secondary | ICD-10-CM | POA: Diagnosis not present

## 2020-08-09 DIAGNOSIS — I129 Hypertensive chronic kidney disease with stage 1 through stage 4 chronic kidney disease, or unspecified chronic kidney disease: Secondary | ICD-10-CM | POA: Diagnosis not present

## 2020-08-09 DIAGNOSIS — E440 Moderate protein-calorie malnutrition: Secondary | ICD-10-CM | POA: Diagnosis not present

## 2020-08-09 DIAGNOSIS — N131 Hydronephrosis with ureteral stricture, not elsewhere classified: Secondary | ICD-10-CM | POA: Diagnosis not present

## 2020-08-12 ENCOUNTER — Telehealth: Payer: Self-pay | Admitting: *Deleted

## 2020-08-12 NOTE — Telephone Encounter (Signed)
Pt called this RN with requested update post starting on antibiotic last week for noted increased effusions.  [name] states she " feels like the antibiotic is beneficial because I do not feel like I have when I am having an active infection like when I had pneumonia "  She states symptoms are overall the about the same- with cough not helped with tessalon perles, or phenergan with codeine cough elixir.  She says what seems to help the most is " keeping a Ricola in my mouth "  She states she has a ventolin inhaler she has used in the past and is asking if she should " try to use it"  Pt will try the ventolin and let this RN know if helpful for possible refill.

## 2020-08-13 DIAGNOSIS — Z436 Encounter for attention to other artificial openings of urinary tract: Secondary | ICD-10-CM | POA: Diagnosis not present

## 2020-08-13 DIAGNOSIS — N179 Acute kidney failure, unspecified: Secondary | ICD-10-CM | POA: Diagnosis not present

## 2020-08-13 DIAGNOSIS — I129 Hypertensive chronic kidney disease with stage 1 through stage 4 chronic kidney disease, or unspecified chronic kidney disease: Secondary | ICD-10-CM | POA: Diagnosis not present

## 2020-08-13 DIAGNOSIS — E860 Dehydration: Secondary | ICD-10-CM | POA: Diagnosis not present

## 2020-08-13 DIAGNOSIS — N131 Hydronephrosis with ureteral stricture, not elsewhere classified: Secondary | ICD-10-CM | POA: Diagnosis not present

## 2020-08-13 DIAGNOSIS — E440 Moderate protein-calorie malnutrition: Secondary | ICD-10-CM | POA: Diagnosis not present

## 2020-08-19 ENCOUNTER — Telehealth: Payer: Self-pay | Admitting: Oncology

## 2020-08-19 ENCOUNTER — Telehealth: Payer: Self-pay | Admitting: *Deleted

## 2020-08-19 ENCOUNTER — Telehealth: Payer: Self-pay

## 2020-08-19 ENCOUNTER — Other Ambulatory Visit: Payer: Self-pay | Admitting: Oncology

## 2020-08-19 DIAGNOSIS — K219 Gastro-esophageal reflux disease without esophagitis: Secondary | ICD-10-CM

## 2020-08-19 DIAGNOSIS — C7951 Secondary malignant neoplasm of bone: Secondary | ICD-10-CM

## 2020-08-19 DIAGNOSIS — C50912 Malignant neoplasm of unspecified site of left female breast: Secondary | ICD-10-CM

## 2020-08-19 DIAGNOSIS — C210 Malignant neoplasm of anus, unspecified: Secondary | ICD-10-CM

## 2020-08-19 MED ORDER — OMEPRAZOLE 40 MG PO CPDR: 40.0000 mg | DELAYED_RELEASE_CAPSULE | Freq: Two times a day (BID) | ORAL | 4 refills | Status: AC

## 2020-08-19 MED ORDER — PREDNISONE 20 MG PO TABS: 20.0000 mg | ORAL_TABLET | Freq: Every day | ORAL | 3 refills | Status: AC

## 2020-08-19 MED ORDER — GABAPENTIN 300 MG PO CAPS: 300.0000 mg | ORAL_CAPSULE | Freq: Four times a day (QID) | ORAL | 4 refills | Status: AC

## 2020-08-19 NOTE — Telephone Encounter (Signed)
Oral Oncology Patient Advocate Encounter   Was successful in securing patient a $6000 grant from [employer] to provide copayment coverage for [name].  This will keep the out of pocket expense at $0.     The billing information is as follows and has been shared with [place].   Member ID: [id number] Group ID: [id number] RxBin: [id number] PCN: [id number] Dates of Eligibility: 08/16/20 through 08/16/21  Fund name:  Metastatic breast.   [place] [facility] Phone [phone] Fax [phone] 08/19/2020 2:53 PM

## 2020-08-19 NOTE — Progress Notes (Signed)
[name] called this morning to let us know that she is very short of breath, having a lot of cough, is having some lymph nodes growing on her neck, and wondering what the plan is.  She says she completed the doxycycline treatment and it was not helpful  Recall she lives in [place].  She is not under hospice or palliative care at this point.  She has not started the talazoparib yet.  She tells me she has not been using the Phenergan/codeine syrup because it does not work.  Just walking 46feet makes her very short of breath.  She is coughing all the time and it is interfering significantly with her quality of life.  She says her sister who is an Therapist, sports listen to her lungs today and she heard air in all lung fields, with crackles at the base which did not clear with coughing.  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  I did encourage her to take the codeine/Phenergan syrup 3 times a day because I think it can only help.  She will let me know if this constipates her.  I am also starting her on prednisone 20 mg daily to see if that helps with the shortness of breath and cough  Finally we are starting the talazoparib today.  She will take 2 tablets every morning.  I will catch up with her later this week via virtual visit to see how she is tolerating that.  If she does not tolerate the talazoparib at this low dose I think we need to move to a hospice situation where she will receive morphine for the cough and shortness of breath.

## 2020-08-19 NOTE — Telephone Encounter (Signed)
[name] left a message stating she has finished her doxycycline and is not improved. " I am still coughing , short of breath with only slight exertion, and I have swollen lymph nodes in my neck"   Also requesting Dr Jana Hakim take over filling her Gabapentin 300 mg QID and Omeprazole 40 mg BID.  Wants to know what to do about Talzenna and what is the plan moving forward?

## 2020-08-19 NOTE — Telephone Encounter (Signed)
Scheduled appointment per 1/10 los. Spoke to patient who is aware of appointment date and time.

## 2020-08-19 NOTE — Telephone Encounter (Signed)
Oral Oncology Patient Advocate Encounter   Was successful in securing patient an $89 grant from Patient [place] [facility]) to provide copayment coverage for [employer].  This will keep the out of pocket expense at $0.     I have spoken with the patient.    The billing information is as follows and has been shared with [place].   Member ID: [id number] Group ID: [id number] RxBin: [id number] Dates of Eligibility: 05/18/20 through 08/15/21  Fund:  Metastatic Breast   [name] Patient [place] Phone [phone] Fax [phone] 08/19/2020 2:51 PM

## 2020-08-20 ENCOUNTER — Other Ambulatory Visit: Payer: Self-pay | Admitting: Student

## 2020-08-20 DIAGNOSIS — I129 Hypertensive chronic kidney disease with stage 1 through stage 4 chronic kidney disease, or unspecified chronic kidney disease: Secondary | ICD-10-CM | POA: Diagnosis not present

## 2020-08-20 DIAGNOSIS — Z436 Encounter for attention to other artificial openings of urinary tract: Secondary | ICD-10-CM | POA: Diagnosis not present

## 2020-08-20 DIAGNOSIS — E440 Moderate protein-calorie malnutrition: Secondary | ICD-10-CM | POA: Diagnosis not present

## 2020-08-20 DIAGNOSIS — N131 Hydronephrosis with ureteral stricture, not elsewhere classified: Secondary | ICD-10-CM | POA: Diagnosis not present

## 2020-08-20 DIAGNOSIS — N179 Acute kidney failure, unspecified: Secondary | ICD-10-CM | POA: Diagnosis not present

## 2020-08-20 DIAGNOSIS — E860 Dehydration: Secondary | ICD-10-CM | POA: Diagnosis not present

## 2020-08-21 ENCOUNTER — Other Ambulatory Visit: Payer: Self-pay

## 2020-08-21 ENCOUNTER — Other Ambulatory Visit (HOSPITAL_COMMUNITY): Payer: Medicare Other

## 2020-08-21 ENCOUNTER — Ambulatory Visit (HOSPITAL_COMMUNITY)
Admission: RE | Admit: 2020-08-21 | Discharge: 2020-08-21 | Disposition: A | Discharge status: Home / Self Care | Payer: Medicare Other | Source: Ambulatory Visit | Attending: Interventional Radiology | Admitting: Interventional Radiology

## 2020-08-21 ENCOUNTER — Other Ambulatory Visit (HOSPITAL_COMMUNITY): Payer: Self-pay | Admitting: Interventional Radiology

## 2020-08-21 DIAGNOSIS — Z85048 Personal history of other malignant neoplasm of rectum, rectosigmoid junction, and anus: Secondary | ICD-10-CM | POA: Diagnosis not present

## 2020-08-21 DIAGNOSIS — N135 Crossing vessel and stricture of ureter without hydronephrosis: Secondary | ICD-10-CM | POA: Insufficient documentation

## 2020-08-21 DIAGNOSIS — J209 Acute bronchitis, unspecified: Secondary | ICD-10-CM | POA: Diagnosis not present

## 2020-08-21 DIAGNOSIS — N139 Obstructive and reflux uropathy, unspecified: Secondary | ICD-10-CM | POA: Diagnosis not present

## 2020-08-21 DIAGNOSIS — Z436 Encounter for attention to other artificial openings of urinary tract: Secondary | ICD-10-CM | POA: Diagnosis not present

## 2020-08-21 DIAGNOSIS — Z853 Personal history of malignant neoplasm of breast: Secondary | ICD-10-CM | POA: Diagnosis not present

## 2020-08-21 DIAGNOSIS — N200 Calculus of kidney: Secondary | ICD-10-CM

## 2020-08-21 DIAGNOSIS — Z936 Other artificial openings of urinary tract status: Secondary | ICD-10-CM | POA: Diagnosis not present

## 2020-08-21 HISTORY — PX: IR NEPHROSTOMY EXCHANGE LEFT: IMG6069

## 2020-08-21 MED ORDER — SODIUM CHLORIDE 0.9% FLUSH: 5.0000 mL | Freq: Three times a day (TID) | INTRAVENOUS | Status: DC

## 2020-08-21 MED ORDER — IOHEXOL 300 MG/ML  SOLN
50.0000 mL | Freq: Once | INTRAMUSCULAR | Status: AC | PRN
  Administered 2020-08-21: 4 mL

## 2020-08-21 MED ORDER — FENTANYL CITRATE (PF) 100 MCG/2ML IJ SOLN
INTRAMUSCULAR | Status: AC
  Filled 2020-08-21: qty 2

## 2020-08-21 MED ORDER — LIDOCAINE-EPINEPHRINE 2 %-1:100000 IJ SOLN
INTRAMUSCULAR | Status: AC | PRN
  Administered 2020-08-21: 8 mL

## 2020-08-21 MED ORDER — LIDOCAINE-EPINEPHRINE 1 %-1:100000 IJ SOLN
INTRAMUSCULAR | Status: AC
  Filled 2020-08-21: qty 1

## 2020-08-21 MED ORDER — MIDAZOLAM HCL 2 MG/2ML IJ SOLN
INTRAMUSCULAR | Status: AC
  Filled 2020-08-21: qty 2

## 2020-08-21 MED ORDER — SODIUM CHLORIDE 0.9 % IV SOLN: INTRAVENOUS | Status: DC

## 2020-08-21 NOTE — Procedures (Signed)
Interventional Radiology Procedure Note  Procedure: Left nephrostomy tube check/change  Findings: Please refer to procedural dictation for full description. Exchange of left 12 Fr nephrostomy for similar catheter.  Complications: None immediate  Estimated Blood Loss: < 5 mL  Recommendations: Keep to bag drainage. Patient provided larger Foley drainage bag with tube adapter for night time use. Plan to return in 6-8 weeks for routine check/change.   Ruthann Cancer, MD

## 2020-08-21 NOTE — H&P (Signed)
Chief Complaint: Patient was seen in consultation today for left PCN exchange with moderate sedation.  Referring Physician(s): Corrie Mckusick  Supervising Physician: Ruthann Cancer  Patient Status: [facility] - Out-pt  History of Present Illness: [name] is a 70 y.o. 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 [name] was last seen in IR on 06/26/20 where she underwent left PCN exchange as well as attempted left ureteral stent removal - however this was performed without sedation and the patient did not tolerate the procedure well, as such she requested that future procedures be performed with moderate sedation.    [name] reports recently being diagnosed with bronchitis which she believes has recently worsened because she is having a more persistent cough and trouble breathing, however her oxygen sats at home have been normal. She denies any fevers or chills. She was just recently started on prednisone by her PCP. She understands the procedure today and is willing to try the exchange without sedation as this is typically how it is performed, however she is very nervous about feeling pain so if that occurs she would like to proceed with sedation medicine.   Past Medical History:  Diagnosis Date  . 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 Bladder infection   . Bone metastasis ([place]) 01/17/2016  . Borderline hypertension   . Breast cancer ([place])   . Closed fracture of humerus sept 2013  . Depression   . Family history of adverse reaction to anesthesia    father remembered his surgery  . Fatigue   . GERD (gastroesophageal reflux disease)   . Hypertension   . 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 Osteoporosis 01/27/2011  . Pneumonia   . Port catheter in place 04/14/2013  . Post-mastectomy lymphedema syndrome   . 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 Reflux   . Retinal detachment   . 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 Swollen lymph nodes   . URI (upper respiratory infection) 02/17/12  . Wears glasses     Past Surgical History:  Procedure Laterality Date  . ABDOMINAL PERINEAL BOWEL RESECTION N/A 09/01/2016   Procedure: ABDOMINAL PERINEAL RESECTION PERMANENT COLOSTOMY;  Surgeon: Leighton Ruff, MD;  Location: [facility];  Service: General;  Laterality: N/A;  . biopsy of lymph node     super clavicle  . BTL    . CATARACT EXTRACTION    . cervical disc fusioni    . CYSTOSCOPY W/ URETERAL STENT PLACEMENT Bilateral 06/03/2020   Procedure: CYSTOSCOPY WITH RETROGRADE PYELOGRAM/URETERAL STENT PLACEMENT bladder biopsy;  Surgeon: [name], MD;  Location: [facility];  Service: Urology;  Laterality: Bilateral;  . CYSTOSCOPY WITH STENT PLACEMENT Left 06/07/2020   Procedure: CYSTOSCOPY LEFT STENT EXCHANGE, retrograde pylogram;  Surgeon: [name], MD;  Location: [facility];  Service: Urology;  Laterality: Left;  . IR NEPHROSTOMY EXCHANGE LEFT  06/26/2020  . IR NEPHROSTOMY PLACEMENT LEFT  06/17/2020  . IR RADIOLOGIST EVAL & MGMT  07/02/2020  . IR TRANSCATH RETRIEVAL FB INCL GUIDANCE (MS)  06/26/2020  . LYMPH NODE BIOPSY Right 06/19/2015   Procedure: RIGHT NECK LYMPH NODE  EXCISION;  Surgeon: Rolm Bookbinder, MD;  Location: [facility];  Service: General;  Laterality: Right;  . LYMPH NODE DISSECTION Left 04/26/2017   Procedure: EXCISION LEFT NECK LYMPH NODE;  Surgeon: Rolm Bookbinder, MD;  Location: [facility];  Service: General;  Laterality: Left;  Marland Kitchen MASS EXCISION Left 07/27/2016   Procedure: EXCISION OF POSTERIOR NECK MASS;  Surgeon: Rolm Bookbinder, MD;  Location:  [facility];  Service: General;  Laterality: Left;  Marland Kitchen MASTECTOMY MODIFIED RADICAL     left breast  . Port- a-cath insertion    . retinal detachment and repair      Allergies: Ciprofloxacin and Prochlorperazine  Medications: Prior to Admission medications   Medication Sig Start Date End Date Taking? Authorizing Provider  acetaminophen (TYLENOL) 500 MG tablet Take 1,000 mg by mouth every 6 (six) hours as needed for moderate pain or headache.   Yes [provider]  albuterol (VENTOLIN HFA) 108 (90 Base) MCG/ACT inhaler Inhale 2 puffs into the lungs daily as needed for wheezing or shortness of breath.   Yes [provider]  buPROPion (WELLBUTRIN SR) 100 MG 12 hr tablet Take 100 mg by mouth 2 (two) times daily.   Yes [provider]  CALCIUM-VITAMIN D PO Take 600 mg by mouth 2 (two) times daily.   Yes [provider]  gabapentin (NEURONTIN) 300 MG capsule Take 1 capsule (300 mg total) by mouth in the morning, at noon, in the evening, and at bedtime. 08/19/20  Yes Magrinat, Virgie Dad, MD  HYDROcodone-acetaminophen (NORCO/VICODIN) 5-325 MG tablet Take 1 tablet by mouth every 4 (four) hours as needed. 07/29/20  Yes [provider]  metoprolol succinate (TOPROL-XL) 25 MG 24 hr tablet Take 25 mg by mouth in the morning and at bedtime.  04/23/16  Yes [provider]  Multiple Vitamin (MULTIVITAMIN WITH MINERALS) TABS tablet Take 1 tablet by mouth daily. 06/22/20  Yes Kayleen Memos, DO  omeprazole (PRILOSEC) 40 MG capsule Take 1 capsule (40 mg total) by mouth 2 (two) times daily. 08/19/20  Yes Magrinat, Virgie Dad, MD  potassium chloride (KLOR-CON) 10 MEQ tablet Take 2 tablets (20 mEq total) by mouth 2 (two) times daily. 07/19/20  Yes Magrinat, Virgie Dad, MD  predniSONE (DELTASONE) 20 MG tablet Take 1 tablet (20 mg total) by mouth daily with breakfast. 08/19/20  Yes Magrinat, Virgie Dad, MD  promethazine-codeine (PHENERGAN WITH CODEINE) 6.25-10  MG/5ML syrup Take 5 mLs by mouth every 6 (six) hours as needed for cough. 07/23/20  Yes Magrinat, Virgie Dad, MD  talazoparib tosylate (TALZENNA) 0.25 MG capsule Take 2 capsules (0.5 mg total) by mouth daily. To start 07/01/2020 06/24/20  Yes Magrinat, Virgie Dad, MD  tamsulosin (FLOMAX) 0.4 MG CAPS capsule Take 0.4 mg by mouth 2 (two) times daily. 08/02/20  Yes [provider]  vitamin B-12 (CYANOCOBALAMIN) 1000 MCG tablet Take 1,000 mcg by mouth 2 (two) times daily.    Yes [provider]  benzonatate (TESSALON) 100 MG capsule Take 1 capsule (100 mg total) by mouth 2 (two) times daily. Patient not taking: No sig reported 07/17/20   Magrinat, Virgie Dad, MD  denosumab (XGEVA) 120 MG/1.7ML SOLN injection Inject 120 mg into the skin every 3 (three) months.    [provider]  docusate sodium (COLACE) 100 MG capsule Take 1 capsule (100 mg total) by mouth daily. 06/22/20   Kayleen Memos, DO  polyethylene glycol (MIRALAX / GLYCOLAX) 17 g packet Take 17 g by mouth  daily. 06/22/20   Kayleen Memos, DO  prochlorperazine (COMPAZINE) 5 MG tablet Take 1 tablet (5 mg total) by mouth 4 (four) times daily -  before meals and at bedtime. Patient not taking: No sig reported 07/11/20   Magrinat, Virgie Dad, MD  sodium chloride 0.9 % infusion Inject 1,000 mLs into the vein daily. 06/22/20   Kayleen Memos, DO     Family History  Problem Relation Age of Onset  . Hypertension Mother   . Heart disease Mother        atrial fib  . Heart disease Father   . Other Father        glaucoma    Social History   Socioeconomic History  . Marital status: Divorced    Spouse name: Not on file  . Number of children: Not on file  . Years of education: Not on file  . Highest education level: Not on file  Occupational History  . Not on file  Tobacco Use  . Smoking status: Never Smoker  . Smokeless tobacco: Never Used  Vaping Use  . Vaping Use: Never used  Substance and Sexual Activity  . Alcohol  use: Yes    Alcohol/week: 7.0 standard drinks    Types: 7 Glasses of wine per week    Comment: 1 drink daily  . Drug use: Yes    Types: Marijuana    Comment: During chemo, none over 1 Year  . Sexual activity: Never  Other Topics Concern  . Not on file  Social History Narrative  . Not on file   Social Determinants of Health   Financial Resource Strain: Not on file  Food Insecurity: Not on file  Transportation Needs: Not on file  Physical Activity: Not on file  Stress: Not on file  Social Connections: Not on file     Review of Systems: A 12 point ROS discussed and pertinent positives are indicated in the HPI above.  All other systems are negative.  Review of Systems  Constitutional: Negative for chills and fever.  Respiratory: Positive for cough and shortness of breath.   Cardiovascular: Negative for chest pain.  Gastrointestinal: Negative for abdominal pain, nausea and vomiting.  Musculoskeletal: Negative for back pain.  Neurological: Negative for dizziness and headaches.    Vital Signs: BP (!) 115/93   Pulse 82   Temp 98.1 F (36.7 C) (Oral)   Resp 19   Ht 5\' 5"  (1.651 m)   Wt 128 lb (58.1 kg)   SpO2 100%   BMI 21.30 kg/m   Physical Exam Vitals reviewed.  Constitutional:      General: She is not in acute distress. HENT:     Head: Normocephalic.     Mouth/Throat:     Mouth: Mucous membranes are moist.     Pharynx: Oropharynx is clear. No oropharyngeal exudate or posterior oropharyngeal erythema.  Cardiovascular:     Rate and Rhythm: Normal rate and regular rhythm.  Pulmonary:     Effort: Pulmonary effort is normal.     Breath sounds: Normal breath sounds.     Comments: (+) dry cough Abdominal:     General: There is no distension.     Palpations: Abdomen is soft.     Tenderness: There is no abdominal tenderness.  Genitourinary:    Comments: (+) left PCN to gravity draining clear yellow urine Skin:    General: Skin is warm and dry.  Neurological:      Mental Status: She is alert  and oriented to person, place, and time.  Psychiatric:        Mood and Affect: Mood normal.        Behavior: Behavior normal.        Thought Content: Thought content normal.        Judgment: Judgment normal.      MD Evaluation Airway: WNL Heart: WNL Abdomen: WNL Chest/ Lungs: WNL ASA  Classification: 3 Mallampati/Airway Score: Two   Imaging: DG Chest 2 View  Result Date: 08/06/2020 CLINICAL DATA:  ongoing cough /covid neg/ prior mild pleural effusions on prior CT EXAM: CHEST - 2 VIEW COMPARISON:  06/19/2020 and prior. FINDINGS: No pneumothorax or pleural effusion. Patchy medial bibasilar opacities. Cardiomediastinal silhouette within normal limits. Right chest wall Port-A-Cath tip overlies the upper atrium. Chronic posttraumatic deformities of the bilateral ribs, right clavicle and right humerus. Sequela of ACDF. IMPRESSION: Patchy bibasilar opacities concerning for infection. Electronically Signed   By: Primitivo Gauze M.D.   On: 08/06/2020 13:59    Labs:  CBC: Recent Labs    06/17/20 1204 06/18/20 0621 06/19/20 0347 06/20/20 0841  WBC 11.3* 7.1 6.1 5.5  HGB 9.4* 8.3* 7.9* 8.2*  HCT 30.0* 26.4* 24.1* 25.3*  PLT 327 263 281 315    COAGS: Recent Labs    06/17/20 1638  INR 1.1    BMP: Recent Labs    06/19/20 0347 06/20/20 0841 06/21/20 0340 06/22/20 0311  NA 135 138 140 139  K 3.7 3.4* 4.0 3.9  CL 103 105 108 107  CO2 25 24 21* 22  GLUCOSE 94 121* 90 92  BUN 25* 15 13 14   CALCIUM 7.3* 7.7* 7.9* 7.9*  CREATININE 3.40* 1.93* 1.73* 1.72*  GFRNONAA 14* 28* 32* 32*    LIVER FUNCTION TESTS: Recent Labs    06/17/20 1204 06/18/20 0621 06/19/20 0347 06/20/20 0841 06/21/20 0340 06/22/20 0311  BILITOT 0.5 0.5 0.2* 0.4  --   --   AST 13* 13* 14* 14*  --   --   ALT 10 8 7 7   --   --   ALKPHOS 63 54 55 57  --   --   PROT 7.0 5.8* 5.9* 6.3*  --   --   ALBUMIN 3.4* 2.8* 2.7* 2.9* 2.8* 2.8*    TUMOR MARKERS: No results  for input(s): AFPTM, CEA, CA199, CHROMGRNA in the last 8760 hours.  Assessment and Plan:  70 y/o F with history of obstructive bilateral uropathy s/p bilateral ureteral stent placement and left PCN placement in IR 06/17/20 with exchange and attempted stent removal 06/26/20 who presents today for routine left PCN exchange with moderate sedation as requested by the patient. She would like to attempt the procedure today without sedation however she would like to have the sedation medicine readily available should the procedure become too uncomfortable to tolerate.  Risks and benefits of left PCN exchange was discussed with the patient including, but not limited to, infection, bleeding, significant bleeding causing loss or decrease in renal function or damage to adjacent structures.   All of the patient's questions were answered, patient is agreeable to proceed.  Consent signed and in chart.  Thank you for this interesting consult.  I greatly enjoyed meeting [name] and look forward to participating in their care.  A copy of this report was sent to the requesting provider on this date.  Electronically Signed: Joaquim Nam, PA-C 08/21/2020, 11:09 AM   I spent a total of15 Minutes in face to  face in clinical consultation, greater than 50% of which was counseling/coordinating care for left PCN exchange with moderate sedation.

## 2020-08-21 NOTE — Progress Notes (Signed)
[place]  [phone] [phone]     ID: [name] DOB: 09-21-1950  MR#: [id number]  YQI#:[id number]  Patient Care Team: Jennifer Squibb, MD as PCP - General (Internal Medicine) Jennifer Cruel, MD OTHER MD:  I connected with [name] on 08/22/20 at 12:45 PM EST by video enabled telemedicine visit and verified that I am speaking with the correct person using two identifiers.   I discussed the limitations, risks, security and privacy concerns of performing an evaluation and management service by telemedicine and the availability of in-person appointments. I also discussed with the patient that there may be a patient responsible charge related to this service. The patient expressed understanding and agreed to proceed.   Other persons participating in the visit and their role in the encounter: Patient's Sister [name]  Patient's location: Home Provider's location: [place]: Metastatic breast cancer  CURRENT TREATMENT: To start talazoparib   INTERVAL HISTORY: [name] was contacted today for evaluation of her history of metastatic breast cancer.   She underwent ureteral stent exchange yesterday 08/21/2020.  She did fine with that and currently has no pain or hematuria.  We started her on talazoparib 08/19/2018.  She is taking 2 tablets every morning.  So far she has had no side effects from this that she is aware of   REVIEW OF SYSTEMS: [name] continues to have significant cough.  Antibiotics have not helped.  Antitussives have not been much help either.  She does take the Phenergan codeine syrup at night.  She has a pulse ox at home and it is consistently greater than 95% even when she feels very short of breath.  Aside from the cough and shortness of breath she feels okay and particularly has no pain problems.  A detailed review of systems was stable  COVID 19 VACCINATION STATUS:    HISTORY OF CURRENT  ILLNESS: From Jennifer Pane, PA-C note on 09/24/2016:  "[name] 70 y.o. female returns for followup of Stage IV breast cancer ER/PR positive, HER-2/neu negative. BRCA2 POSITIVE. History of stage IIIc left breast cancer in 2009 treated with neoadjuvant chemotherapy consisting of epirubicin and Cytoxan followed by 4 cycles of docetaxel from 04/16/2008-08/13/2008 with the initiation of Herceptin for 52 weeks at the start of docetaxel. She then underwent a left mastectomy on 08/30/2008 showing persistent disease in the breast and lymph nodes. This was followed by radiation therapy from 09/17/2008-11/26/2008 to the left chest wall, supraclavicular, and axillary node areas. She then moved on to antiestrogen therapy with letrozole (11/27/2008-04/20/2011). She was found to have recurrent breast cancer on supraclavicular lymph node biopsy on 03/30/2011 leading to a change in antiestrogen therapy to tamoxifen (04/21/2011-06/04/2014). Pet imaging on 05/25/2014 demonstrated progression of left submandibular lymph node, mediastinal lymph node, and probable lung involvement. This was followed by a biopsy on 06/13/2014 of a left supraclavicular mass that did reveal metastatic carcinoma consistent with breast primary. She was then treated with carboplatin and Taxol for 6 cycles with an excellent response (06/25/2014-10/09/2014). She was then transition back to hormone therapy consisting of Ibrance and Faslodex (11/13/2014-06/10/2015). This was complicated by grade 3 neutropenia with cycle #2 resulting in a dose reduction of Ibrance to 100 mg for cycle 3. Unfortunately, on 06/10/2015, she was noted to have progression of disease in the right mid cervical lymph node over a 4 day period that was PET avid. Therapy was therefore changed to exemestane and everolimus (07/02/2015-08/01/2015)  that was complicated by an adverse reaction consisting of diffuse pneumonitis. This regimen was therefore discontinued. She then  transitioned back to systemic chemotherapy consisting of cisplatin every 14 days (08/23/2015-12/23/2015). She was put on a drug holiday beginning on 12/23/2015 in preparation for a trip to [place] on 01/25/2016. She was on Xeloda maintenance in a 7 day on and 7 day off fashion beginning on 04/20/2016, but progression of disease was noted in cervical lymph node.  She is NOW on [place] beginning on 08/26/2016. AND squamous cell carcinoma of anus, HPV POSITIVE, S/P XRT with Xeloda concomitantly in the neoadjuvant setting (02/07/2016- 03/10/2016).  S/P APR by Dr. Leighton Conway on 3/61/4431, demonstrating a partial response to therapy, Stage IIA (T2N0M0).  Initial medical oncology recommendations provided by Dr. Tressie Conway at [facility]"  The patient's subsequent history is as detailed below.   PAST MEDICAL HISTORY: Past Medical History:  Diagnosis Date  . Adenocarcinoma of breast ([facility]) 01/27/2011   stage IIIc breast cancer  . Bladder infection   . Bone metastasis ([facility]) 01/17/2016  . Borderline hypertension   . Breast cancer ([facility])   . Closed fracture of humerus sept 2013  . Depression   . Family history of adverse reaction to anesthesia    father remembered his surgery  . Fatigue   . GERD (gastroesophageal reflux disease)   . Hypertension   . 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 Osteoporosis 01/27/2011  . Pneumonia   . Port catheter in place 04/14/2013  . Post-mastectomy lymphedema syndrome   . 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 Reflux   . Retinal detachment   . 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 Swollen lymph nodes   . URI (upper respiratory infection) 02/17/12  . Wears glasses     PAST SURGICAL HISTORY: Past Surgical History:  Procedure Laterality Date  . ABDOMINAL PERINEAL BOWEL RESECTION N/A 09/01/2016   Procedure: ABDOMINAL PERINEAL RESECTION PERMANENT COLOSTOMY;  Surgeon: Jennifer Ruff, MD;   Location: [facility];  Service: General;  Laterality: N/A;  . biopsy of lymph node     super clavicle  . BTL    . CATARACT EXTRACTION    . cervical disc fusioni    . CYSTOSCOPY W/ URETERAL STENT PLACEMENT Bilateral 06/03/2020   Procedure: CYSTOSCOPY WITH RETROGRADE PYELOGRAM/URETERAL STENT PLACEMENT bladder biopsy;  Surgeon: [name], MD;  Location: [facility];  Service: Urology;  Laterality: Bilateral;  . CYSTOSCOPY WITH STENT PLACEMENT Left 06/07/2020   Procedure: CYSTOSCOPY LEFT STENT EXCHANGE, retrograde pylogram;  Surgeon: [name], MD;  Location: [facility];  Service: Urology;  Laterality: Left;  . IR NEPHROSTOMY EXCHANGE LEFT  06/26/2020  . IR NEPHROSTOMY EXCHANGE LEFT  08/21/2020  . IR NEPHROSTOMY PLACEMENT LEFT  06/17/2020  . IR RADIOLOGIST EVAL & MGMT  07/02/2020  . IR TRANSCATH RETRIEVAL FB INCL GUIDANCE (MS)  06/26/2020  . LYMPH NODE BIOPSY Right 06/19/2015   Procedure: RIGHT NECK LYMPH NODE EXCISION;  Surgeon: Rolm Bookbinder, MD;  Location: [facility];  Service: General;  Laterality: Right;  . LYMPH NODE DISSECTION Left 04/26/2017   Procedure: EXCISION LEFT NECK LYMPH NODE;  Surgeon: Rolm Bookbinder, MD;  Location: [facility];  Service: General;  Laterality: Left;  Marland Kitchen MASS EXCISION Left 07/27/2016   Procedure: EXCISION OF POSTERIOR NECK MASS;  Surgeon: Rolm Bookbinder, MD;  Location: [facility];  Service: General;  Laterality: Left;  .  MASTECTOMY MODIFIED RADICAL     left breast  . Port- a-cath insertion    . retinal detachment and repair      FAMILY HISTORY: Family History  Problem Relation Age of Onset  . Hypertension Mother   . Heart disease Mother        atrial fib  . Heart disease Father   . Other Father        glaucoma      GYNECOLOGIC HISTORY:     SOCIAL HISTORY: (updated 07/2020)  [name]    ADVANCED DIRECTIVES:    HEALTH MAINTENANCE: Social History   Tobacco Use  . Smoking status: Never Smoker  .  Smokeless tobacco: Never Used  Vaping Use  . Vaping Use: Never used  Substance Use Topics  . Alcohol use: Yes    Alcohol/week: 7.0 standard drinks    Types: 7 Glasses of wine per week    Comment: 1 drink daily  . Drug use: Yes    Types: Marijuana    Comment: During chemo, none over 1 Year     Colonoscopy:   PAP:   Bone density:    Allergies  Allergen Reactions  . Ciprofloxacin Other (See Comments)    tendonitis  . Prochlorperazine Anxiety    Other reaction(s): Confusion    Current Outpatient Medications  Medication Sig Dispense Refill  . acetaminophen (TYLENOL) 500 MG tablet Take 1,000 mg by mouth every 6 (six) hours as needed for moderate pain or headache.    . albuterol (VENTOLIN HFA) 108 (90 Base) MCG/ACT inhaler Inhale 2 puffs into the lungs daily as needed for wheezing or shortness of breath.    . benzonatate (TESSALON) 100 MG capsule Take 1 capsule (100 mg total) by mouth 2 (two) times daily. (Patient not taking: No sig reported) 30 capsule 0  . buPROPion (WELLBUTRIN SR) 100 MG 12 hr tablet Take 100 mg by mouth 2 (two) times daily.    Marland Kitchen CALCIUM-VITAMIN D PO Take 600 mg by mouth 2 (two) times daily.    Marland Kitchen denosumab (XGEVA) 120 MG/1.7ML SOLN injection Inject 120 mg into the skin every 3 (three) months.    . docusate sodium (COLACE) 100 MG capsule Take 1 capsule (100 mg total) by mouth daily. 10 capsule 0  . gabapentin (NEURONTIN) 300 MG capsule Take 1 capsule (300 mg total) by mouth in the morning, at noon, in the evening, and at bedtime. 120 capsule 4  . HYDROcodone-acetaminophen (NORCO/VICODIN) 5-325 MG tablet Take 1 tablet by mouth every 4 (four) hours as needed.    . metoprolol succinate (TOPROL-XL) 25 MG 24 hr tablet Take 25 mg by mouth in the morning and at bedtime.   5  . Multiple Vitamin (MULTIVITAMIN WITH MINERALS) TABS tablet Take 1 tablet by mouth daily. 90 tablet 0  . omeprazole (PRILOSEC) 40 MG capsule Take 1 capsule (40 mg total) by mouth 2 (two) times daily.  180 capsule 4  . polyethylene glycol (MIRALAX / GLYCOLAX) 17 g packet Take 17 g by mouth daily. 14 each 0  . potassium chloride (KLOR-CON) 10 MEQ tablet Take 2 tablets (20 mEq total) by mouth 2 (two) times daily. 120 tablet 6  . predniSONE (DELTASONE) 20 MG tablet Take 1 tablet (20 mg total) by mouth daily with breakfast. 60 tablet 3  . prochlorperazine (COMPAZINE) 5 MG tablet Take 1 tablet (5 mg total) by mouth 4 (four) times daily -  before meals and at bedtime. (Patient not taking: No sig reported) 90 tablet  4  . promethazine-codeine (PHENERGAN WITH CODEINE) 6.25-10 MG/5ML syrup Take 5 mLs by mouth every 6 (six) hours as needed for cough. 120 mL 0  . sodium chloride 0.9 % infusion Inject 1,000 mLs into the vein daily. 30000 mL 0  . talazoparib tosylate (TALZENNA) 0.25 MG capsule Take 2 capsules (0.5 mg total) by mouth daily. To start 07/01/2020 60 capsule 3  . tamsulosin (FLOMAX) 0.4 MG CAPS capsule Take 0.4 mg by mouth 2 (two) times daily.    . vitamin B-12 (CYANOCOBALAMIN) 1000 MCG tablet Take 1,000 mcg by mouth 2 (two) times daily.      No current facility-administered medications for this visit.    OBJECTIVE:   There were no vitals filed for this visit.   There is no height or weight on file to calculate BMI.   Wt Readings from Last 3 Encounters:  08/21/20 128 lb (58.1 kg)  07/09/20 120 lb (54.4 kg)  06/17/20 123 lb 4.8 oz (55.9 kg)   The patient's shortness of breath was evident during the visit as she had some difficulty completing some sentences.  She also had some cough during the telemetry visit.    ECOG FS: 2  Telemedicine visit 08/22/2020   LAB RESULTS:  CMP     Component Value Date/Time   NA 139 06/22/2020 0311   K 3.9 06/22/2020 0311   CL 107 06/22/2020 0311   CO2 22 06/22/2020 0311   GLUCOSE 92 06/22/2020 0311   BUN 14 06/22/2020 0311   CREATININE 1.72 (H) 06/22/2020 0311   CALCIUM 7.9 (L) 06/22/2020 0311   PROT 6.3 (L) 06/20/2020 0841   ALBUMIN 2.8 (L)  06/22/2020 0311   AST 14 (L) 06/20/2020 0841   ALT 7 06/20/2020 0841   ALKPHOS 57 06/20/2020 0841   BILITOT 0.4 06/20/2020 0841   GFRNONAA 32 (L) 06/22/2020 0311   GFRAA >60 10/07/2016 0834    No results found for: TOTALPROTELP, ALBUMINELP, A1GS, A2GS, BETS, BETA2SER, GAMS, MSPIKE, SPEI  Lab Results  Component Value Date   WBC 5.5 06/20/2020   NEUTROABS 10.0 (H) 06/17/2020   HGB 8.2 (L) 06/20/2020   HCT 25.3 (L) 06/20/2020   MCV 102.4 (H) 06/20/2020   PLT 315 06/20/2020    Lab Results  Component Value Date   LABCA2 33.2 08/11/2016    No components found for: VVOHYW737  No results for input(s): INR in the last 168 hours.  Lab Results  Component Value Date   LABCA2 33.2 08/11/2016    No results found for: CAN199  No results found for: TGG269  Lab Results  Component Value Date   CAN153 26.4 (H) 08/11/2016    No results found for: CA2729  No components found for: HGQUANT  No results found for: CEA1 / No results found for: CEA1   No results found for: AFPTUMOR  No results found for: CHROMOGRNA  No results found for: KPAFRELGTCHN, LAMBDASER, KAPLAMBRATIO (kappa/lambda light chains)  No results found for: HGBA, HGBA2QUANT, HGBFQUANT, HGBSQUAN (Hemoglobinopathy evaluation)   Lab Results  Component Value Date   LDH 176 04/27/2008    Lab Results  Component Value Date   IRON 79 10/07/2016   TIBC 277 10/07/2016   IRONPCTSAT 28 10/07/2016   (Iron and TIBC)  Lab Results  Component Value Date   FERRITIN 156 10/07/2016    Urinalysis    Component Value Date/Time   COLORURINE STRAW (A) 06/03/2020 [place] 06/03/2020 1613   LABSPEC 1.008 06/03/2020 1613  PHURINE 6.0 06/03/2020 1613   GLUCOSEU NEGATIVE 06/03/2020 1613   HGBUR NEGATIVE 06/03/2020 1613   BILIRUBINUR NEGATIVE 06/03/2020 1613   KETONESUR NEGATIVE 06/03/2020 1613   PROTEINUR NEGATIVE 06/03/2020 1613   UROBILINOGEN 0.2 08/06/2008 1500   NITRITE NEGATIVE 06/03/2020  1613   LEUKOCYTESUR NEGATIVE 06/03/2020 1613     STUDIES: DG Chest 2 View  Result Date: 08/06/2020 CLINICAL DATA:  ongoing cough /covid neg/ prior mild pleural effusions on prior CT EXAM: CHEST - 2 VIEW COMPARISON:  06/19/2020 and prior. FINDINGS: No pneumothorax or pleural effusion. Patchy medial bibasilar opacities. Cardiomediastinal silhouette within normal limits. Right chest wall Port-A-Cath tip overlies the upper atrium. Chronic posttraumatic deformities of the bilateral ribs, right clavicle and right humerus. Sequela of ACDF. IMPRESSION: Patchy bibasilar opacities concerning for infection. Electronically Signed   By: Primitivo Gauze M.D.   On: 08/06/2020 13:59   IR NEPHROSTOMY EXCHANGE LEFT  Result Date: 08/21/2020 INDICATION: 70 year old female with history of metastatic breast cancer status post bilateral ureteral stent placement with malfunctioning left renal stent requiring percutaneous nephrostomy placement on 06/17/2020. Most recent exchange/upsize on 06/26/2020. The patient presents today for routine check and exchange. EXAM: FLUOROSCOPIC GUIDED left SIDED NEPHROSTOMY CATHETER EXCHANGE COMPARISON:  06/26/2020 CONTRAST:  A total of 5 mL Isovue-300 administered was administered into the collecting system FLUOROSCOPY TIME:  0.6 minutes, 1 mGy COMPLICATIONS: None immediate. TECHNIQUE: Informed written consent was obtained from the patient after a discussion of the risks, benefits and alternatives to treatment. Questions regarding the procedure were encouraged and answered. A timeout was performed prior to the initiation of the procedure. The left flank and external portions of existing nephrostomy catheter was prepped and draped in the usual sterile fashion. A sterile drape was applied covering the operative field. Maximum barrier sterile technique with sterile gowns and gloves were used for the procedure. A timeout was performed prior to the initiation of the procedure. A pre procedural  spot fluoroscopic image was obtained. A small amount of contrast was injected via the existing nephrostomy catheter demonstrating appropriate positioning within the renal pelvis. The existing nephrostomy catheter was cut and cannulated with an Amplatz wire which was coiled within the renal pelvis. Under intermittent fluoroscopic guidance, the existing nephrostomy catheter was exchanged for a new 12 [place] all-purpose drainage catheter. Limited contrast injection confirmed appropriate positioning within the renal pelvis and a post exchange fluoroscopic image was obtained. The catheter was locked, secured to the skin with an interrupted suture and reconnected to a gravity bag. A dressing was placed. The patient tolerated the procedure well without immediate postprocedural complication. FINDINGS: The existing nephrostomy catheter is appropriately positioned and functioning. After successful fluoroscopic guided exchange, a new 12French nephrostomy catheter is coiled and locked within the renal pelvis. IMPRESSION: Successful fluoroscopic guided exchange of left 12 French percutaneous nephrostomy catheter. PLAN: Return in 6-8 weeks for routine check and exchange. Ruthann Cancer, MD [facility] Electronically Signed   By: Ruthann Cancer MD   On: 08/21/2020 12:57     ELIGIBLE FOR AVAILABLE RESEARCH PROTOCOL: *no  ASSESSMENT: 70 y.o. [place] woman with stage IV BRCA2 positive breast cancer, as follows:  (1) left axillary nodal mass biopsy 04/09/2008 shows breast cancer, estrogen receptor 100% positive, progesterone receptor 5% positive, HER-2/neu amplified,             (a) staging studies showed stage IIIc disease  (2) received epirubicin/cyclophosphamide x4 dose dense followed by docetaxel x4 between 04/16/2008 and 08/13/2018             (  a) trastuzumab started with docetaxel and continued for 52 weeks  (3) left mastectomy 08/30/2008 showed  persistent disease in the breast and lymph nodes.  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  (8) carboplatin and paclitaxel x6 cycles with good response (06/25/2014 through 10/09/2014)  (9) palbociclib and Faslodex 11/13/2014, discontinued 06/10/2015 with progression              (a) right cervical lymph node biopsy BRCA2 positive  (10) exemestane and everolimus 07/02/2015 through 08/01/2015             (a) possible pneumonitis from everolimus  (11) denosumab/Xgeva started December 2016  (12) cisplatin (40 mg/m every other week) 08/23/2015 through 12/23/2015 discontinued due to patient's desire for drug holiday  (13) olaparib 08/26/2016 through 09/08/2016--poorly tolerated             (a) ANAL CANCER resected 09/01/2016  (14) Megace 10/12/2016 through 05/11/2017, discontinued with progression             (a) repeat left posterior cervical chain node biopsy again estrogen receptor positive now progesterone receptor as well as HER-2 negative  (15) eribulin 06/01/2017 through 11/23/2017  (16) toremifene 02/11/2018 through 10/05/2018, discontinued with progression  (17) Doxil 10/18/2018 through 04/03/2019  (18) pembrolizumab 08/21/2019 through 10/22/2019  (18) carboplatin and paclitaxel 10/24/2019 through 02/23/2020  (19) started talazoparib 08/19/2020 at 2 tablets (0.5 mg) daily   PLAN: Otelia remains very short of breath.  She has significant cough.  This has not responded to  antibiotics.  I started her on prednisone 3 days ago.  So far she has had no benefit from that either.  I am going to add Lasix 20 mg every morning to see if that is helpful.  She tells me her saturations are greater than 95% which means oxygen really would not make a difference to her.  Clearly the reason for her shortness of breath and cough is the cancer in her lungs.  We are going to continue the PARP inhibitors and when we check in again virtually 08/26/2020 if we can we will up the dose of those.  However if we do attain the target dose and there is no improvement after say 3 to 4 weeks, I think we could conclude that there is not going to be any improvement and we will need to switch over to comfort care/full hospice hospice situation.  We will discuss that next week.  She knows to call for any other issue that may develop before then   Sarajane Jews C. Nakoma Gotwalt, MD 08/22/2020 1:43 PM Medical Oncology and Hematology [facility] [place], [place] [address] Tel. [phone]    Fax. [phone]   This document serves as a record of services personally performed by Lurline Del, MD. It was created on his behalf by Wilburn Mylar, a trained medical scribe. The creation of this record is based on the scribe's personal observations and the provider's statements to them.   Lindie Spruce MD, have reviewed the  above documentation for accuracy and completeness, and I agree with the above.   *Total Encounter Time as defined by the Centers for Medicare and Medicaid Services includes, in addition to the face-to-face time of a patient visit (documented in the note above) non-face-to-face time: obtaining and reviewing outside history, ordering and reviewing medications, tests or procedures, care coordination (communications with other health care professionals or caregivers) and documentation in the medical record.

## 2020-08-21 NOTE — Sedation Documentation (Signed)
Procedure completed. No sedation given. Will transport to Radiology Nurses station to discharge home.

## 2020-08-21 NOTE — Progress Notes (Signed)
Ms. Scrivens received no sedation for left nephrostomy tube exchange today. Discharging from radiology nurses station. PIV removed, intact tip. Provided patient with follow up appointment time for next routine exchange.  Wheeled to lobby via wheelchair to meet caregiver Sharlene Dory.  No acute distress noted. Patient discharged in stable condition.

## 2020-08-22 ENCOUNTER — Inpatient Hospital Stay: Payer: Medicare Other | Attending: Oncology | Admitting: Oncology

## 2020-08-22 DIAGNOSIS — Z1502 Genetic susceptibility to malignant neoplasm of ovary: Secondary | ICD-10-CM

## 2020-08-22 DIAGNOSIS — Z85048 Personal history of other malignant neoplasm of rectum, rectosigmoid junction, and anus: Secondary | ICD-10-CM | POA: Diagnosis not present

## 2020-08-22 DIAGNOSIS — T451X5D Adverse effect of antineoplastic and immunosuppressive drugs, subsequent encounter: Secondary | ICD-10-CM | POA: Diagnosis not present

## 2020-08-22 DIAGNOSIS — M810 Age-related osteoporosis without current pathological fracture: Secondary | ICD-10-CM | POA: Diagnosis not present

## 2020-08-22 DIAGNOSIS — N390 Urinary tract infection, site not specified: Secondary | ICD-10-CM

## 2020-08-22 DIAGNOSIS — Z170 Estrogen receptor positive status [ER+]: Secondary | ICD-10-CM | POA: Diagnosis not present

## 2020-08-22 DIAGNOSIS — N1330 Unspecified hydronephrosis: Secondary | ICD-10-CM | POA: Diagnosis not present

## 2020-08-22 DIAGNOSIS — Z79899 Other long term (current) drug therapy: Secondary | ICD-10-CM | POA: Diagnosis not present

## 2020-08-22 DIAGNOSIS — Z1501 Genetic susceptibility to malignant neoplasm of breast: Secondary | ICD-10-CM

## 2020-08-22 DIAGNOSIS — N179 Acute kidney failure, unspecified: Secondary | ICD-10-CM

## 2020-08-22 DIAGNOSIS — Z1509 Genetic susceptibility to other malignant neoplasm: Secondary | ICD-10-CM

## 2020-08-22 DIAGNOSIS — N139 Obstructive and reflux uropathy, unspecified: Secondary | ICD-10-CM | POA: Diagnosis not present

## 2020-08-22 DIAGNOSIS — C50919 Malignant neoplasm of unspecified site of unspecified female breast: Secondary | ICD-10-CM | POA: Diagnosis not present

## 2020-08-22 DIAGNOSIS — G620 Drug-induced polyneuropathy: Secondary | ICD-10-CM | POA: Diagnosis not present

## 2020-08-22 DIAGNOSIS — F419 Anxiety disorder, unspecified: Secondary | ICD-10-CM | POA: Diagnosis not present

## 2020-08-22 DIAGNOSIS — C50812 Malignant neoplasm of overlapping sites of left female breast: Secondary | ICD-10-CM | POA: Diagnosis not present

## 2020-08-22 DIAGNOSIS — Z933 Colostomy status: Secondary | ICD-10-CM | POA: Diagnosis not present

## 2020-08-22 DIAGNOSIS — F32A Depression, unspecified: Secondary | ICD-10-CM | POA: Diagnosis not present

## 2020-08-22 DIAGNOSIS — N131 Hydronephrosis with ureteral stricture, not elsewhere classified: Secondary | ICD-10-CM | POA: Diagnosis not present

## 2020-08-22 DIAGNOSIS — C210 Malignant neoplasm of anus, unspecified: Secondary | ICD-10-CM

## 2020-08-22 DIAGNOSIS — Z7952 Long term (current) use of systemic steroids: Secondary | ICD-10-CM | POA: Diagnosis not present

## 2020-08-22 DIAGNOSIS — N1339 Other hydronephrosis: Secondary | ICD-10-CM | POA: Diagnosis not present

## 2020-08-22 DIAGNOSIS — C7951 Secondary malignant neoplasm of bone: Secondary | ICD-10-CM

## 2020-08-22 DIAGNOSIS — M818 Other osteoporosis without current pathological fracture: Secondary | ICD-10-CM

## 2020-08-22 DIAGNOSIS — E440 Moderate protein-calorie malnutrition: Secondary | ICD-10-CM | POA: Diagnosis not present

## 2020-08-22 DIAGNOSIS — E860 Dehydration: Secondary | ICD-10-CM | POA: Diagnosis not present

## 2020-08-22 DIAGNOSIS — D539 Nutritional anemia, unspecified: Secondary | ICD-10-CM | POA: Diagnosis not present

## 2020-08-22 DIAGNOSIS — N1832 Chronic kidney disease, stage 3b: Secondary | ICD-10-CM | POA: Diagnosis not present

## 2020-08-22 DIAGNOSIS — K219 Gastro-esophageal reflux disease without esophagitis: Secondary | ICD-10-CM | POA: Diagnosis not present

## 2020-08-22 DIAGNOSIS — Z436 Encounter for attention to other artificial openings of urinary tract: Secondary | ICD-10-CM | POA: Diagnosis not present

## 2020-08-22 DIAGNOSIS — I129 Hypertensive chronic kidney disease with stage 1 through stage 4 chronic kidney disease, or unspecified chronic kidney disease: Secondary | ICD-10-CM | POA: Diagnosis not present

## 2020-08-22 MED ORDER — FUROSEMIDE 20 MG PO TABS: 20.0000 mg | ORAL_TABLET | Freq: Every day | ORAL | 3 refills | Status: AC

## 2020-08-23 DIAGNOSIS — R509 Fever, unspecified: Secondary | ICD-10-CM | POA: Diagnosis not present

## 2020-08-26 ENCOUNTER — Telehealth: Payer: Self-pay | Admitting: Oncology

## 2020-08-26 ENCOUNTER — Telehealth (HOSPITAL_BASED_OUTPATIENT_CLINIC_OR_DEPARTMENT_OTHER): Payer: Medicare Other | Admitting: Oncology

## 2020-08-26 DIAGNOSIS — Z170 Estrogen receptor positive status [ER+]: Secondary | ICD-10-CM

## 2020-08-26 DIAGNOSIS — C210 Malignant neoplasm of anus, unspecified: Secondary | ICD-10-CM | POA: Diagnosis not present

## 2020-08-26 DIAGNOSIS — C50812 Malignant neoplasm of overlapping sites of left female breast: Secondary | ICD-10-CM

## 2020-08-26 DIAGNOSIS — C7951 Secondary malignant neoplasm of bone: Secondary | ICD-10-CM

## 2020-08-26 NOTE — Progress Notes (Signed)
[place]  [phone] [phone]     ID: [name] DOB: Aug 14, 1950  MR#: [id number]  TKW#:[id number]  Patient Care Team: Celene Squibb, MD as PCP - General (Internal Medicine) Chauncey Cruel, MD OTHER MD:  I connected with [name] on 08/26/20 at  2:00 PM EST by video enabled telemedicine visit and verified that I am speaking with the correct person using two identifiers.   I discussed the limitations, risks, security and privacy concerns of performing an evaluation and management service by telemedicine and the availability of in-person appointments. I also discussed with the patient that there may be a patient responsible charge related to this service. The patient expressed understanding and agreed to proceed.   Other persons participating in the visit and their role in the encounter: none  Patient's location: Home Provider's location: [place]: Metastatic breast cancer  CURRENT TREATMENT: talazoparib   INTERVAL HISTORY: [name] was contacted today for evaluation of her history of metastatic breast cancer.   We started her on talazoparib 08/19/2020.  She is taking 2 tablets every morning.  So far she has had no side effects from this that she is aware of.  She tells me she read the possible side effects today and really she is not experiencing any  We also started her on prednisone 20 mg daily first dose 08/20/2020, and Lasix 20 mg in the morning first dose 08/22/2020.  The purpose of this of course was to improve her shortness of breath and cough but so far she tells me has really not done anything   REVIEW OF SYSTEMS: [name] tells me when she lies flat her saturation is 91%.  When she props up her back to about 30% it goes to 94% and when she is sitting up right is 97%.  She tells me she is eating like a pig, possibly because of the prednisone.  She thinks it is because she is simply bored and  has nothing else to do.  She is beginning to hurt a bit more on her side, and is taking hydrocodone for this.  She waits until the pain is a 7 or an 8.  She has only taken a pill a day at the most.  She is not constipated from this but is producing high-volume nondiarrheal bowel movements she says.    COVID 19 VACCINATION STATUS:    HISTORY OF CURRENT ILLNESS: From Jennifer Pane, PA-C note on 09/24/2016:  "[name] 70 y.o. female returns for followup of Stage IV breast cancer ER/PR positive, HER-2/neu negative. BRCA2 POSITIVE. History of stage IIIc left breast cancer in 2009 treated with neoadjuvant chemotherapy consisting of epirubicin and Cytoxan followed by 4 cycles of docetaxel from 04/16/2008-08/13/2008 with the initiation of Herceptin for 52 weeks at the start of docetaxel. She then underwent a left mastectomy on 08/30/2008 showing persistent disease in the breast and lymph nodes. This was followed by radiation therapy from 09/17/2008-11/26/2008 to the left chest wall, supraclavicular, and axillary node areas. She then moved on to antiestrogen therapy with letrozole (11/27/2008-04/20/2011). She was found to have recurrent breast cancer on supraclavicular lymph node biopsy on 03/30/2011 leading to a change in antiestrogen therapy to tamoxifen (04/21/2011-06/04/2014). Pet imaging on 05/25/2014 demonstrated progression of left submandibular lymph node, mediastinal lymph node, and probable lung involvement. This was followed by a biopsy on 06/13/2014 of a left supraclavicular mass that did reveal metastatic carcinoma consistent with  breast primary. She was then treated with carboplatin and Taxol for 6 cycles with an excellent response (06/25/2014-10/09/2014). She was then transition back to hormone therapy consisting of Ibrance and Faslodex (11/13/2014-06/10/2015). This was complicated by grade 3 neutropenia with cycle #2 resulting in a dose reduction of Ibrance to 100 mg for cycle 3. Unfortunately,  on 06/10/2015, she was noted to have progression of disease in the right mid cervical lymph node over a 4 day period that was PET avid. Therapy was therefore changed to exemestane and everolimus (97/09/6376-58/85/0277) that was complicated by an adverse reaction consisting of diffuse pneumonitis. This regimen was therefore discontinued. She then transitioned back to systemic chemotherapy consisting of cisplatin every 14 days (08/23/2015-12/23/2015). She was put on a drug holiday beginning on 12/23/2015 in preparation for a trip to [place] on 01/25/2016. She was on Xeloda maintenance in a 7 day on and 7 day off fashion beginning on 04/20/2016, but progression of disease was noted in cervical lymph node.  She is NOW on [place] beginning on 08/26/2016. AND squamous cell carcinoma of anus, HPV POSITIVE, S/P XRT with Xeloda concomitantly in the neoadjuvant setting (02/07/2016- 03/10/2016).  S/P APR by Dr. Leighton Ruff on 11/19/8784, demonstrating a partial response to therapy, Stage IIA (T2N0M0).  Initial medical oncology recommendations provided by Dr. Tressie Stalker at [facility]"  The patient's subsequent history is as detailed below.   PAST MEDICAL HISTORY: Past Medical History:  Diagnosis Date  . Adenocarcinoma of breast ([place]) 01/27/2011   stage IIIc breast cancer  . Bladder infection   . Bone metastasis ([place]) 01/17/2016  . Borderline hypertension   . Breast cancer ([place])   . Closed fracture of humerus sept 2013  . Depression   . Family history of adverse reaction to anesthesia    father remembered his surgery  . Fatigue   . GERD (gastroesophageal reflux disease)   . Hypertension   . 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 Osteoporosis 01/27/2011  . Pneumonia   . Port catheter in place 04/14/2013  . Post-mastectomy lymphedema syndrome   . 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 Reflux   . Retinal detachment   . 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 Swollen lymph nodes   . URI (upper respiratory infection) 02/17/12  . Wears glasses     PAST SURGICAL HISTORY: Past Surgical History:  Procedure Laterality Date  . ABDOMINAL PERINEAL BOWEL RESECTION N/A 09/01/2016   Procedure: ABDOMINAL PERINEAL RESECTION PERMANENT COLOSTOMY;  Surgeon: Leighton Ruff, MD;  Location: [facility];  Service: General;  Laterality: N/A;  . biopsy of lymph node     super clavicle  . BTL    . CATARACT EXTRACTION    . cervical disc fusioni    . CYSTOSCOPY W/ URETERAL STENT PLACEMENT Bilateral 06/03/2020   Procedure: CYSTOSCOPY WITH RETROGRADE PYELOGRAM/URETERAL STENT PLACEMENT bladder biopsy;  Surgeon: [name], MD;  Location: [facility];  Service: Urology;  Laterality: Bilateral;  . CYSTOSCOPY WITH STENT PLACEMENT Left 06/07/2020   Procedure: CYSTOSCOPY LEFT STENT EXCHANGE, retrograde pylogram;  Surgeon: [name], MD;  Location: [facility];  Service: Urology;  Laterality: Left;  . IR NEPHROSTOMY EXCHANGE LEFT  06/26/2020  . IR NEPHROSTOMY EXCHANGE LEFT  08/21/2020  . IR NEPHROSTOMY PLACEMENT LEFT  06/17/2020  . IR RADIOLOGIST EVAL & MGMT  07/02/2020  . IR TRANSCATH RETRIEVAL FB INCL GUIDANCE (MS)  06/26/2020  .  LYMPH NODE BIOPSY Right 06/19/2015   Procedure: RIGHT NECK LYMPH NODE EXCISION;  Surgeon: Rolm Bookbinder, MD;  Location: [facility];  Service: General;  Laterality: Right;  . LYMPH NODE DISSECTION Left 04/26/2017   Procedure: EXCISION LEFT NECK LYMPH NODE;  Surgeon: Rolm Bookbinder, MD;  Location: [facility];  Service: General;  Laterality: Left;  Marland Kitchen MASS EXCISION Left 07/27/2016   Procedure: EXCISION OF POSTERIOR NECK MASS;  Surgeon: Rolm Bookbinder, MD;  Location: [facility];  Service: General;  Laterality: Left;  Marland Kitchen MASTECTOMY MODIFIED RADICAL     left breast  . Port- a-cath insertion    . retinal detachment and repair      FAMILY HISTORY: Family History  Problem Relation Age of  Onset  . Hypertension Mother   . Heart disease Mother        atrial fib  . Heart disease Father   . 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     ADVANCED DIRECTIVES:    HEALTH MAINTENANCE: Social History   Tobacco Use  . Smoking status: Never Smoker  . Smokeless tobacco: Never Used  Vaping Use  . Vaping Use: Never used  Substance Use Topics  . Alcohol use: Yes    Alcohol/week: 7.0 standard drinks    Types: 7 Glasses of wine per week    Comment: 1 drink daily  . Drug use: Yes    Types: Marijuana    Comment: During chemo, none over 1 Year     Colonoscopy:   PAP:   Bone density:    Allergies  Allergen Reactions  . Ciprofloxacin Other (See Comments)    tendonitis  . Prochlorperazine Anxiety    Other reaction(s): Confusion    Current Outpatient Medications  Medication Sig Dispense Refill  . acetaminophen (TYLENOL) 500 MG tablet Take 1,000 mg by mouth every 6 (six) hours as needed for moderate pain or headache.    . albuterol (VENTOLIN HFA) 108 (90 Base) MCG/ACT inhaler Inhale 2 puffs into the lungs daily as needed for wheezing or shortness of breath.    . benzonatate (TESSALON) 100 MG capsule Take 1 capsule (100 mg total) by mouth 2 (two) times daily. (Patient not taking: No sig reported) 30 capsule 0  . buPROPion (WELLBUTRIN SR) 100 MG 12 hr tablet Take 100 mg by mouth 2 (two) times daily.    Marland Kitchen CALCIUM-VITAMIN D PO Take 600 mg by mouth 2 (two) times daily.    Marland Kitchen denosumab (XGEVA) 120 MG/1.7ML SOLN injection Inject 120 mg into the skin every 3 (three) months.    . docusate sodium (COLACE) 100 MG capsule Take 1 capsule (100 mg total) by mouth daily. 10 capsule 0  . furosemide (LASIX) 20 MG tablet Take 1 tablet (20 mg total) by mouth daily. 30 tablet 3  . gabapentin (NEURONTIN) 300 MG capsule Take 1 capsule (300 mg total) by mouth in  the morning, at noon, in the evening, and at bedtime. 120 capsule 4  . HYDROcodone-acetaminophen (NORCO/VICODIN) 5-325 MG tablet Take 1 tablet by mouth every 4 (four) hours as needed.    . metoprolol succinate (TOPROL-XL) 25 MG 24 hr tablet Take 25 mg by mouth in the morning and at bedtime.   5  . Multiple Vitamin (MULTIVITAMIN WITH MINERALS) TABS tablet Take  1 tablet by mouth daily. 90 tablet 0  . omeprazole (PRILOSEC) 40 MG capsule Take 1 capsule (40 mg total) by mouth 2 (two) times daily. 180 capsule 4  . polyethylene glycol (MIRALAX / GLYCOLAX) 17 g packet Take 17 g by mouth daily. 14 each 0  . potassium chloride (KLOR-CON) 10 MEQ tablet Take 2 tablets (20 mEq total) by mouth 2 (two) times daily. 120 tablet 6  . predniSONE (DELTASONE) 20 MG tablet Take 1 tablet (20 mg total) by mouth daily with breakfast. 60 tablet 3  . prochlorperazine (COMPAZINE) 5 MG tablet Take 1 tablet (5 mg total) by mouth 4 (four) times daily -  before meals and at bedtime. (Patient not taking: No sig reported) 90 tablet 4  . promethazine-codeine (PHENERGAN WITH CODEINE) 6.25-10 MG/5ML syrup Take 5 mLs by mouth every 6 (six) hours as needed for cough. 120 mL 0  . sodium chloride 0.9 % infusion Inject 1,000 mLs into the vein daily. 30000 mL 0  . talazoparib tosylate (TALZENNA) 0.25 MG capsule Take 2 capsules (0.5 mg total) by mouth daily. To start 07/01/2020 60 capsule 3  . tamsulosin (FLOMAX) 0.4 MG CAPS capsule Take 0.4 mg by mouth 2 (two) times daily.    . vitamin B-12 (CYANOCOBALAMIN) 1000 MCG tablet Take 1,000 mcg by mouth 2 (two) times daily.      No current facility-administered medications for this visit.    OBJECTIVE:   There were no vitals filed for this visit.   There is no height or weight on file to calculate BMI.   Wt Readings from Last 3 Encounters:  08/21/20 128 lb (58.1 kg)  07/09/20 120 lb (54.4 kg)  06/17/20 123 lb 4.8 oz (55.9 kg)   Based on her video appearance Robbyn does appear to have  gained some weight.  She was able to speak without interruptions (another where she could complete sentences) and only coughed a couple of times during today's televisit    ECOG FS: 2  Telemedicine visit 08/26/2020   LAB RESULTS:  CMP     Component Value Date/Time   NA 139 06/22/2020 0311   K 3.9 06/22/2020 0311   CL 107 06/22/2020 0311   CO2 22 06/22/2020 0311   GLUCOSE 92 06/22/2020 0311   BUN 14 06/22/2020 0311   CREATININE 1.72 (H) 06/22/2020 0311   CALCIUM 7.9 (L) 06/22/2020 0311   PROT 6.3 (L) 06/20/2020 0841   ALBUMIN 2.8 (L) 06/22/2020 0311   AST 14 (L) 06/20/2020 0841   ALT 7 06/20/2020 0841   ALKPHOS 57 06/20/2020 0841   BILITOT 0.4 06/20/2020 0841   GFRNONAA 32 (L) 06/22/2020 0311   GFRAA >60 10/07/2016 0834    No results found for: TOTALPROTELP, ALBUMINELP, A1GS, A2GS, BETS, BETA2SER, GAMS, MSPIKE, SPEI  Lab Results  Component Value Date   WBC 5.5 06/20/2020   NEUTROABS 10.0 (H) 06/17/2020   HGB 8.2 (L) 06/20/2020   HCT 25.3 (L) 06/20/2020   MCV 102.4 (H) 06/20/2020   PLT 315 06/20/2020    Lab Results  Component Value Date   LABCA2 33.2 08/11/2016    No components found for: [id number]  No results for input(s): INR in the last 168 hours.  Lab Results  Component Value Date   LABCA2 33.2 08/11/2016    No results found for: UMP536  No results found for: RWE315  Lab Results  Component Value Date   CAN153 26.4 (H) 08/11/2016    No results found for: CA2729  No  components found for: HGQUANT  No results found for: CEA1 / No results found for: CEA1   No results found for: AFPTUMOR  No results found for: CHROMOGRNA  No results found for: KPAFRELGTCHN, LAMBDASER, KAPLAMBRATIO (kappa/lambda light chains)  No results found for: HGBA, HGBA2QUANT, HGBFQUANT, HGBSQUAN (Hemoglobinopathy evaluation)   Lab Results  Component Value Date   LDH 176 04/27/2008    Lab Results  Component Value Date   IRON 79 10/07/2016   TIBC 277 10/07/2016    IRONPCTSAT 28 10/07/2016   (Iron and TIBC)  Lab Results  Component Value Date   FERRITIN 156 10/07/2016    Urinalysis    Component Value Date/Time   COLORURINE STRAW (A) 06/03/2020 [place] 06/03/2020 1613   LABSPEC 1.008 06/03/2020 1613   PHURINE 6.0 06/03/2020 1613   GLUCOSEU NEGATIVE 06/03/2020 1613   [place] 06/03/2020 1613   BILIRUBINUR NEGATIVE 06/03/2020 1613   KETONESUR NEGATIVE 06/03/2020 [place] 06/03/2020 1613   UROBILINOGEN 0.2 08/06/2008 1500   NITRITE NEGATIVE 06/03/2020 1613   [place] 06/03/2020 1613     STUDIES: DG Chest 2 View  Result Date: 08/06/2020 CLINICAL DATA:  ongoing cough /covid neg/ prior mild pleural effusions on prior CT EXAM: CHEST - 2 VIEW COMPARISON:  06/19/2020 and prior. FINDINGS: No pneumothorax or pleural effusion. Patchy medial bibasilar opacities. Cardiomediastinal silhouette within normal limits. Right chest wall Port-A-Cath tip overlies the upper atrium. Chronic posttraumatic deformities of the bilateral ribs, right clavicle and right humerus. Sequela of ACDF. IMPRESSION: Patchy bibasilar opacities concerning for infection. Electronically Signed   By: Primitivo Gauze M.D.   On: 08/06/2020 13:59   IR NEPHROSTOMY EXCHANGE LEFT  Result Date: 08/21/2020 INDICATION: 70 year old female with history of metastatic breast cancer status post bilateral ureteral stent placement with malfunctioning left renal stent requiring percutaneous nephrostomy placement on 06/17/2020. Most recent exchange/upsize on 06/26/2020. The patient presents today for routine check and exchange. EXAM: FLUOROSCOPIC GUIDED left SIDED NEPHROSTOMY CATHETER EXCHANGE COMPARISON:  06/26/2020 CONTRAST:  A total of 5 mL Isovue-300 administered was administered into the collecting system FLUOROSCOPY TIME:  0.6 minutes, 1 mGy COMPLICATIONS: None immediate. TECHNIQUE: Informed written consent was obtained from the patient  after a discussion of the risks, benefits and alternatives to treatment. Questions regarding the procedure were encouraged and answered. A timeout was performed prior to the initiation of the procedure. The left flank and external portions of existing nephrostomy catheter was prepped and draped in the usual sterile fashion. A sterile drape was applied covering the operative field. Maximum barrier sterile technique with sterile gowns and gloves were used for the procedure. A timeout was performed prior to the initiation of the procedure. A pre procedural spot fluoroscopic image was obtained. A small amount of contrast was injected via the existing nephrostomy catheter demonstrating appropriate positioning within the renal pelvis. The existing nephrostomy catheter was cut and cannulated with an Amplatz wire which was coiled within the renal pelvis. Under intermittent fluoroscopic guidance, the existing nephrostomy catheter was exchanged for a new 12 [place] all-purpose drainage catheter. Limited contrast injection confirmed appropriate positioning within the renal pelvis and a post exchange fluoroscopic image was obtained. The catheter was locked, secured to the skin with an interrupted suture and reconnected to a gravity bag. A dressing was placed. The patient tolerated the procedure well without immediate postprocedural complication. FINDINGS: The existing nephrostomy catheter is appropriately positioned and functioning. After successful fluoroscopic guided exchange, a new 12French nephrostomy catheter is  coiled and locked within the renal pelvis. IMPRESSION: Successful fluoroscopic guided exchange of left 12 French percutaneous nephrostomy catheter. PLAN: Return in 6-8 weeks for routine check and exchange. Marliss Coots, MD [facility] Electronically Signed   By: Marliss Coots MD   On: 08/21/2020 12:57     ELIGIBLE FOR AVAILABLE RESEARCH PROTOCOL:  *no  ASSESSMENT: 70 y.o. [name] woman with stage IV BRCA2 positive breast cancer, as follows:  (1) left axillary nodal mass biopsy 04/09/2008 shows breast cancer, estrogen receptor 100% positive, progesterone receptor 5% positive, HER-2/neu amplified,             (a) staging studies showed stage IIIc disease  (2) received epirubicin/cyclophosphamide x4 dose dense followed by docetaxel x4 between 04/16/2008 and 08/13/2018             (a) trastuzumab started with docetaxel and continued for 52 weeks  (3) left mastectomy 08/30/2008 showed persistent disease in the breast and lymph nodes.  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  (8) carboplatin and paclitaxel x6 cycles with good response (06/25/2014 through 10/09/2014)  (9) palbociclib and Faslodex 11/13/2014, discontinued 06/10/2015 with progression              (a) right cervical lymph node biopsy BRCA2 positive  (10) exemestane and everolimus 07/02/2015 through 08/01/2015             (a) possible pneumonitis from everolimus  (11) denosumab/Xgeva started December 2016  (12) cisplatin (40 mg/m every other week) 08/23/2015 through 12/23/2015 discontinued due to patient's desire for drug holiday  (13) olaparib 08/26/2016 through 09/08/2016--poorly tolerated             (a) ANAL CANCER resected 09/01/2016  (14) Megace 10/12/2016 through 05/11/2017, discontinued with progression             (a) repeat left posterior cervical chain node  biopsy again estrogen receptor positive now progesterone receptor as well as HER-2 negative  (15) eribulin 06/01/2017 through 11/23/2017  (16) toremifene 02/11/2018 through 10/05/2018, discontinued with progression  (17) Doxil 10/18/2018 through 04/03/2019  (18) pembrolizumab 08/21/2019 through 10/22/2019  (18) carboplatin and paclitaxel 10/24/2019 through 02/23/2020  (19) started talazoparib 08/19/2020 at 2 tablets (0.5 mg) daily  (a) dose increased to 4 tablets 1.0 mg) daily beginning 08/26/2020  PLAN: [name] is no better as far as her breathing is concerned and her cough despite pretty much maximal medications including steroids, diuretics, inhalers and prior antibiotics.  She is not hypoxic at present so O2 really would not be of much help.  If she is really no better after another week we are going to stop the Lasix and taper off the prednisone  She is tolerating the talazoparib remarkably well.  We are increasing the dose to the target dose today, that is 1 mg daily.  If she has any side effects from this she will back off again to 2 tablets daily instead of 4.  I think once we stop the prednisone her increased  appetite also will go back to baseline.  As far as the pain is concerned she is currently using Percocet.  She can also use Aleve/Tylenol since the main reason for her renal issues was not nonsteroidals but hydronephrosis and she has currently stent in place.  We will discuss this further at the next visit  At the next visit also I will try to confirm her COVID vaccination status, her healthcare power of attorney, and fill out her social history  Total encounter time 25 minutes.Sarajane Jews C. Tonya Wantz, MD 08/26/2020 2:15 PM Medical Oncology and Hematology [facility] [place], [place] [address] Tel. [phone]    Fax. [phone]   This document serves as a record of services personally performed by Lurline Del, MD. It was created  on his behalf by Wilburn Mylar, a trained medical scribe. The creation of this record is based on the scribe's personal observations and the provider's statements to them.   I, Lurline Del MD, have reviewed the above documentation for accuracy and completeness, and I agree with the above.   *Total Encounter Time as defined by the Centers for Medicare and Medicaid Services includes, in addition to the face-to-face time of a patient visit (documented in the note above) non-face-to-face time: obtaining and reviewing outside history, ordering and reviewing medications, tests or procedures, care coordination (communications with other health care professionals or caregivers) and documentation in the medical record.

## 2020-08-26 NOTE — Telephone Encounter (Signed)
Scheduled appt per 1/13 los. Pt confirmed appt date and time.

## 2020-08-27 NOTE — Telephone Encounter (Signed)
Patient is approved for Talzenna at no charge 08/23/20-08/09/21  [place] [facility] [place] Phone [phone] Fax [phone] 08/27/2020 12:13 PM

## 2020-08-29 DIAGNOSIS — C7951 Secondary malignant neoplasm of bone: Secondary | ICD-10-CM | POA: Diagnosis not present

## 2020-08-29 DIAGNOSIS — D539 Nutritional anemia, unspecified: Secondary | ICD-10-CM | POA: Diagnosis not present

## 2020-08-29 DIAGNOSIS — C50919 Malignant neoplasm of unspecified site of unspecified female breast: Secondary | ICD-10-CM | POA: Diagnosis not present

## 2020-08-29 DIAGNOSIS — N131 Hydronephrosis with ureteral stricture, not elsewhere classified: Secondary | ICD-10-CM | POA: Diagnosis not present

## 2020-08-29 DIAGNOSIS — E440 Moderate protein-calorie malnutrition: Secondary | ICD-10-CM | POA: Diagnosis not present

## 2020-08-29 DIAGNOSIS — Z436 Encounter for attention to other artificial openings of urinary tract: Secondary | ICD-10-CM | POA: Diagnosis not present

## 2020-09-02 ENCOUNTER — Inpatient Hospital Stay (HOSPITAL_BASED_OUTPATIENT_CLINIC_OR_DEPARTMENT_OTHER): Payer: Medicare Other | Admitting: Oncology

## 2020-09-02 DIAGNOSIS — Z170 Estrogen receptor positive status [ER+]: Secondary | ICD-10-CM

## 2020-09-02 DIAGNOSIS — C50812 Malignant neoplasm of overlapping sites of left female breast: Secondary | ICD-10-CM

## 2020-09-02 MED ORDER — HYDROCODONE-ACETAMINOPHEN 5-325 MG PO TABS: 1.0000 | ORAL_TABLET | Freq: Four times a day (QID) | ORAL | 0 refills | Status: DC | PRN

## 2020-09-02 NOTE — Progress Notes (Signed)
I spoke with [name] by phone as she had not been set up for a virtual visit today.  She tells me she had a temperature of 102 last night.  She took some Tylenol and the temperature resolved and has not recurred.  She is having more cough and shortness of breath.  The only thing that helps this is Percocet.  Recall that she is currently on Decadron 20 mg in the morning and Lasix 20 mg every morning and also has inhalers none of which have made any difference at all to her breathing.  She tells me she had a reading of 89% of her saturation.  I asked her to go ahead and check it while lying down because it may be even lower while lying down and in that case I can get her some oxygen at home  She is taking 4 tablets of Talzenna every morning with no side effects that she is aware of.  She has been on this dose which is the target dose since 08/27/2019.  Before that she took 2 tablets daily for about a week.  I refilled her Percocet and she may either take a half a tablet every 4 hours which is what she has been doing or if she prefers a full tablet every 6 hours.  Clearly [name] has a very poor quality of life right now.  I am glad the Percocet is helping.  We can only hope that the [name] will start working before her breathing gets even worse.  She is very close to needing hospice at this point.  We will have another virtual visit in 1 week but she knows to call me for any other reason including a low saturation before then.

## 2020-09-03 ENCOUNTER — Telehealth: Payer: Self-pay | Admitting: Oncology

## 2020-09-03 NOTE — Telephone Encounter (Signed)
Scheduled appts per 1/24 los. Pt confirmed appt date and time.  

## 2020-09-05 ENCOUNTER — Other Ambulatory Visit: Payer: Self-pay | Admitting: Oncology

## 2020-09-05 NOTE — Progress Notes (Signed)
I called [name] just to make sure she did not need anything over the weekend.  She actually is having a "good day", able to do some chores, less coughing.  She has noted that the lymph nodes in her neck are actually all going down so it is not just one of them becoming necrotic.  She has not documented a saturation less than 91% to this point so we still cannot get her any oxygen but at this point it sounds like things may actually be perhaps turning around and were being very cautiously optimistic.  Of course she is also taking the Percocet 4 times a day and that does help her breathing.  She is moderately constipated and we discussed how to resolve that issue.  We will talk again early next week.

## 2020-09-06 DIAGNOSIS — Z436 Encounter for attention to other artificial openings of urinary tract: Secondary | ICD-10-CM | POA: Diagnosis not present

## 2020-09-06 DIAGNOSIS — N131 Hydronephrosis with ureteral stricture, not elsewhere classified: Secondary | ICD-10-CM | POA: Diagnosis not present

## 2020-09-06 DIAGNOSIS — E440 Moderate protein-calorie malnutrition: Secondary | ICD-10-CM | POA: Diagnosis not present

## 2020-09-06 DIAGNOSIS — C7951 Secondary malignant neoplasm of bone: Secondary | ICD-10-CM | POA: Diagnosis not present

## 2020-09-06 DIAGNOSIS — D539 Nutritional anemia, unspecified: Secondary | ICD-10-CM | POA: Diagnosis not present

## 2020-09-06 DIAGNOSIS — C50919 Malignant neoplasm of unspecified site of unspecified female breast: Secondary | ICD-10-CM | POA: Diagnosis not present

## 2020-09-08 NOTE — Progress Notes (Signed)
[place]  [phone] [phone]) [phone]     ID: [name] DOB: 02-Oct-1950  MR#: [id number]  MLY#:[id number]  Patient Care Team: Celene Squibb, MD as PCP - General (Internal Medicine) Chauncey Cruel, MD OTHER MD:  I connected with [name] on 09/09/20 at 11:15 AM EST by video enabled telemedicine visit and verified that I am speaking with the correct person using two identifiers.   I discussed the limitations, risks, security and privacy concerns of performing an evaluation and management service by telemedicine and the availability of in-person appointments. I also discussed with the patient that there may be a patient responsible charge related to this service. The patient expressed understanding and agreed to proceed.   Other persons participating in the visit and their role in the encounter: Patient's sister  Patient's location: Home Provider's location: [place]: Metastatic breast cancer  CURRENT TREATMENT: talazoparib   INTERVAL HISTORY: [name] was contacted today for evaluation of her history of metastatic breast cancer.   We started her on talazoparib 08/19/2020.  After about a week we increased the dose to 4 tablets every morning.  She is tolerating that with no side effects that she is aware of.  We also started her on prednisone 20 mg daily first dose 08/20/2020, and Lasix 20 mg in the morning first dose 08/22/2020.  Her shortness of breath is a bit better but she still has significant cough.  She tells me this morning after lying down for quite a while her sats (which are usually lower when flat) was 93%, with heart rate of 71.  REVIEW OF SYSTEMS: [name] continues to have significant cough.  She is using cough drops and taking hydrocodone/APAP 4 times a day and that helps.  She is able to sleep a little bit better than she was before but the Decadron is causing her significant insomnia.  She  tells me that the lymph nodes on the left of her neck have all decreased.  The pain on the left side is now not an issue.  She does not know if it is the Percocet or the gabapentin or just the fact that it is better   COVID 19 VACCINATION STATUS:    HISTORY OF CURRENT ILLNESS: From Robynn Pane, PA-C note on 09/24/2016:  "[name] 70 y.o. female returns for followup of Stage IV breast cancer ER/PR positive, HER-2/neu negative. BRCA2 POSITIVE. History of stage IIIc left breast cancer in 2009 treated with neoadjuvant chemotherapy consisting of epirubicin and Cytoxan followed by 4 cycles of docetaxel from 04/16/2008-08/13/2008 with the initiation of Herceptin for 52 weeks at the start of docetaxel. She then underwent a left mastectomy on 08/30/2008 showing persistent disease in the breast and lymph nodes. This was followed by radiation therapy from 09/17/2008-11/26/2008 to the left chest wall, supraclavicular, and axillary node areas. She then moved on to antiestrogen therapy with letrozole (11/27/2008-04/20/2011). She was found to have recurrent breast cancer on supraclavicular lymph node biopsy on 03/30/2011 leading to a change in antiestrogen therapy to tamoxifen (04/21/2011-06/04/2014). Pet imaging on 05/25/2014 demonstrated progression of left submandibular lymph node, mediastinal lymph node, and probable lung involvement. This was followed by a biopsy on 06/13/2014 of a left supraclavicular mass that did reveal metastatic carcinoma consistent with breast primary. She was then treated with carboplatin and Taxol for 6 cycles with an excellent response (06/25/2014-10/09/2014). She was then transition back to hormone therapy consisting of Ibrance  and Faslodex (11/13/2014-06/10/2015). This was complicated by grade 3 neutropenia with cycle #2 resulting in a dose reduction of Ibrance to 100 mg for cycle 3. Unfortunately, on 06/10/2015, she was noted to have progression of disease in the right mid  cervical lymph node over a 4 day period that was PET avid. Therapy was therefore changed to exemestane and everolimus (74/25/9563-87/56/4332) that was complicated by an adverse reaction consisting of diffuse pneumonitis. This regimen was therefore discontinued. She then transitioned back to systemic chemotherapy consisting of cisplatin every 14 days (08/23/2015-12/23/2015). She was put on a drug holiday beginning on 12/23/2015 in preparation for a trip to [place] on 01/25/2016. She was on Xeloda maintenance in a 7 day on and 7 day off fashion beginning on 04/20/2016, but progression of disease was noted in cervical lymph node.  She is NOW on [place] beginning on 08/26/2016. AND squamous cell carcinoma of anus, HPV POSITIVE, S/P XRT with Xeloda concomitantly in the neoadjuvant setting (02/07/2016- 03/10/2016).  S/P APR by Dr. Leighton Ruff on 9/51/8841, demonstrating a partial response to therapy, Stage IIA (T2N0M0).  Initial medical oncology recommendations provided by Dr. Tressie Stalker at [facility]"  The patient's subsequent history is as detailed below.   PAST MEDICAL HISTORY: Past Medical History:  Diagnosis Date  . Adenocarcinoma of breast ([place]) 01/27/2011   stage IIIc breast cancer  . Bladder infection   . Bone metastasis ([place]) 01/17/2016  . Borderline hypertension   . Breast cancer ([place])   . Closed fracture of humerus sept 2013  . Depression   . Family history of adverse reaction to anesthesia    father remembered his surgery  . Fatigue   . GERD (gastroesophageal reflux disease)   . Hypertension   . 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 Osteoporosis 01/27/2011  . Pneumonia   . Port catheter in place 04/14/2013  . Post-mastectomy lymphedema syndrome   . 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 Reflux   . Retinal detachment   . 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 Swollen lymph nodes   . URI (upper respiratory  infection) 02/17/12  . Wears glasses     PAST SURGICAL HISTORY: Past Surgical History:  Procedure Laterality Date  . ABDOMINAL PERINEAL BOWEL RESECTION N/A 09/01/2016   Procedure: ABDOMINAL PERINEAL RESECTION PERMANENT COLOSTOMY;  Surgeon: Leighton Ruff, MD;  Location: [facility];  Service: General;  Laterality: N/A;  . biopsy of lymph node     super clavicle  . BTL    . CATARACT EXTRACTION    . cervical disc fusioni    . CYSTOSCOPY W/ URETERAL STENT PLACEMENT Bilateral 06/03/2020   Procedure: CYSTOSCOPY WITH RETROGRADE PYELOGRAM/URETERAL STENT PLACEMENT bladder biopsy;  Surgeon: [name], MD;  Location: [facility];  Service: Urology;  Laterality: Bilateral;  . CYSTOSCOPY WITH STENT PLACEMENT Left 06/07/2020   Procedure: CYSTOSCOPY LEFT STENT EXCHANGE, retrograde pylogram;  Surgeon: [name], MD;  Location: [facility];  Service: Urology;  Laterality: Left;  . IR NEPHROSTOMY EXCHANGE LEFT  06/26/2020  . IR NEPHROSTOMY EXCHANGE LEFT  08/21/2020  . IR NEPHROSTOMY PLACEMENT LEFT  06/17/2020  . IR RADIOLOGIST EVAL & MGMT  07/02/2020  . IR TRANSCATH RETRIEVAL FB INCL GUIDANCE (MS)  06/26/2020  . LYMPH NODE BIOPSY Right 06/19/2015   Procedure: RIGHT NECK LYMPH NODE EXCISION;  Surgeon: Rolm Bookbinder, MD;  Location: [facility];  Service: General;  Laterality:  Right;  Marland Kitchen LYMPH NODE DISSECTION Left 04/26/2017   Procedure: EXCISION LEFT NECK LYMPH NODE;  Surgeon: Rolm Bookbinder, MD;  Location: [facility];  Service: General;  Laterality: Left;  Marland Kitchen MASS EXCISION Left 07/27/2016   Procedure: EXCISION OF POSTERIOR NECK MASS;  Surgeon: Rolm Bookbinder, MD;  Location: [facility];  Service: General;  Laterality: Left;  Marland Kitchen MASTECTOMY MODIFIED RADICAL     left breast  . Port- a-cath insertion    . retinal detachment and repair      FAMILY HISTORY: Family History  Problem Relation Age of Onset  . Hypertension Mother   . Heart disease Mother        atrial fib  .  Heart disease Father   . 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     ADVANCED DIRECTIVES:    HEALTH MAINTENANCE: Social History   Tobacco Use  . Smoking status: Never Smoker  . Smokeless tobacco: Never Used  Vaping Use  . Vaping Use: Never used  Substance Use Topics  . Alcohol use: Yes    Alcohol/week: 7.0 standard drinks    Types: 7 Glasses of wine per week    Comment: 1 drink daily  . Drug use: Yes    Types: Marijuana    Comment: During chemo, none over 1 Year     Colonoscopy:   PAP:   Bone density:    Allergies  Allergen Reactions  . Ciprofloxacin Other (See Comments)    tendonitis  . Prochlorperazine Anxiety    Other reaction(s): Confusion    Current Outpatient Medications  Medication Sig Dispense Refill  . albuterol (VENTOLIN HFA) 108 (90 Base) MCG/ACT inhaler Inhale 2 puffs into the lungs daily as needed for wheezing or shortness of breath.    . benzonatate (TESSALON) 100 MG capsule Take 1 capsule (100 mg total) by mouth 2 (two) times daily. (Patient not taking: No sig reported) 30 capsule 0  . buPROPion (WELLBUTRIN SR) 100 MG 12 hr tablet Take 100 mg by mouth 2 (two) times daily.    Marland Kitchen CALCIUM-VITAMIN D PO Take 600 mg by mouth 2 (two) times daily.    Marland Kitchen denosumab (XGEVA) 120 MG/1.7ML SOLN injection Inject 120 mg into the skin every 3 (three) months.    . docusate sodium (COLACE) 100 MG capsule Take 1 capsule (100 mg total) by mouth daily. 10 capsule 0  . furosemide (LASIX) 20 MG tablet Take 1 tablet (20 mg total) by mouth daily. 30 tablet 3  . gabapentin (NEURONTIN) 300 MG capsule Take 1 capsule (300 mg total) by mouth in the morning, at noon, in the evening, and at bedtime. 120 capsule 4  . HYDROcodone-acetaminophen (NORCO/VICODIN) 5-325 MG tablet Take 1-2 tablets by mouth every 6 (six) hours as needed. 120 tablet 0  .  metoprolol succinate (TOPROL-XL) 25 MG 24 hr tablet Take 25 mg by mouth in the morning and at bedtime.   5  . Multiple Vitamin (MULTIVITAMIN WITH MINERALS) TABS tablet Take 1 tablet by mouth daily. 90 tablet 0  . omeprazole (PRILOSEC) 40 MG capsule Take 1 capsule (40 mg total) by mouth 2 (two) times daily. 180 capsule 4  . polyethylene glycol (MIRALAX / GLYCOLAX) 17 g packet Take 17 g by mouth daily. 14 each 0  . potassium chloride (  KLOR-CON) 10 MEQ tablet Take 2 tablets (20 mEq total) by mouth 2 (two) times daily. 120 tablet 6  . predniSONE (DELTASONE) 20 MG tablet Take 1 tablet (20 mg total) by mouth daily with breakfast. 60 tablet 3  . prochlorperazine (COMPAZINE) 5 MG tablet Take 1 tablet (5 mg total) by mouth 4 (four) times daily -  before meals and at bedtime. (Patient not taking: No sig reported) 90 tablet 4  . promethazine-codeine (PHENERGAN WITH CODEINE) 6.25-10 MG/5ML syrup Take 5 mLs by mouth every 6 (six) hours as needed for cough. 120 mL 0  . sodium chloride 0.9 % infusion Inject 1,000 mLs into the vein daily. 30000 mL 0  . talazoparib tosylate (TALZENNA) 0.25 MG capsule Take 4 capsules (1 mg total) by mouth daily. To start 07/01/2020 120 capsule 6  . tamsulosin (FLOMAX) 0.4 MG CAPS capsule Take 0.4 mg by mouth 2 (two) times daily.    . vitamin B-12 (CYANOCOBALAMIN) 1000 MCG tablet Take 1,000 mcg by mouth 2 (two) times daily.      No current facility-administered medications for this visit.    OBJECTIVE:   There were no vitals filed for this visit.   There is no height or weight on file to calculate BMI.   Wt Readings from Last 3 Encounters:  08/21/20 128 lb (58.1 kg)  07/09/20 120 lb (54.4 kg)  06/17/20 123 lb 4.8 oz (55.9 kg)       ECOG FS: 2  Telemedicine visit 09/09/2020   LAB RESULTS:  CMP     Component Value Date/Time   NA 139 06/22/2020 0311   K 3.9 06/22/2020 0311   CL 107 06/22/2020 0311   CO2 22 06/22/2020 0311   GLUCOSE 92 06/22/2020 0311   BUN 14  06/22/2020 0311   CREATININE 1.72 (H) 06/22/2020 0311   CALCIUM 7.9 (L) 06/22/2020 0311   PROT 6.3 (L) 06/20/2020 0841   ALBUMIN 2.8 (L) 06/22/2020 0311   AST 14 (L) 06/20/2020 0841   ALT 7 06/20/2020 0841   ALKPHOS 57 06/20/2020 0841   BILITOT 0.4 06/20/2020 0841   GFRNONAA 32 (L) 06/22/2020 0311   GFRAA >60 10/07/2016 0834    No results found for: TOTALPROTELP, ALBUMINELP, A1GS, A2GS, BETS, BETA2SER, GAMS, MSPIKE, SPEI  Lab Results  Component Value Date   WBC 5.5 06/20/2020   NEUTROABS 10.0 (H) 06/17/2020   HGB 8.2 (L) 06/20/2020   HCT 25.3 (L) 06/20/2020   MCV 102.4 (H) 06/20/2020   PLT 315 06/20/2020    Lab Results  Component Value Date   LABCA2 33.2 08/11/2016    No components found for: [id number]  No results for input(s): INR in the last 168 hours.  Lab Results  Component Value Date   LABCA2 33.2 08/11/2016    No results found for: CAN199  No results found for: TWK462  Lab Results  Component Value Date   CAN153 26.4 (H) 08/11/2016    No results found for: CA2729  No components found for: HGQUANT  No results found for: CEA1 / No results found for: CEA1   No results found for: AFPTUMOR  No results found for: CHROMOGRNA  No results found for: KPAFRELGTCHN, LAMBDASER, KAPLAMBRATIO (kappa/lambda light chains)  No results found for: HGBA, HGBA2QUANT, HGBFQUANT, HGBSQUAN (Hemoglobinopathy evaluation)   Lab Results  Component Value Date   LDH 176 04/27/2008    Lab Results  Component Value Date   IRON 79 10/07/2016   TIBC 277 10/07/2016   IRONPCTSAT 28 10/07/2016   (  Iron and TIBC)  Lab Results  Component Value Date   FERRITIN 156 10/07/2016    Urinalysis    Component Value Date/Time   COLORURINE STRAW (A) 06/03/2020 1613   APPEARANCEUR CLEAR 06/03/2020 1613   LABSPEC 1.008 06/03/2020 1613   PHURINE 6.0 06/03/2020 1613   GLUCOSEU NEGATIVE 06/03/2020 1613   HGBUR NEGATIVE 06/03/2020 1613   BILIRUBINUR NEGATIVE 06/03/2020 1613    KETONESUR NEGATIVE 06/03/2020 1613   PROTEINUR NEGATIVE 06/03/2020 1613   UROBILINOGEN 0.2 08/06/2008 1500   NITRITE NEGATIVE 06/03/2020 1613   LEUKOCYTESUR NEGATIVE 06/03/2020 1613    STUDIES: IR NEPHROSTOMY EXCHANGE LEFT  Result Date: 08/21/2020 INDICATION: 70 year old female with history of metastatic breast cancer status post bilateral ureteral stent placement with malfunctioning left renal stent requiring percutaneous nephrostomy placement on 06/17/2020. Most recent exchange/upsize on 06/26/2020. The patient presents today for routine check and exchange. EXAM: FLUOROSCOPIC GUIDED left SIDED NEPHROSTOMY CATHETER EXCHANGE COMPARISON:  06/26/2020 CONTRAST:  A total of 5 mL Isovue-300 administered was administered into the collecting system FLUOROSCOPY TIME:  0.6 minutes, 1 mGy COMPLICATIONS: None immediate. TECHNIQUE: Informed written consent was obtained from the patient after a discussion of the risks, benefits and alternatives to treatment. Questions regarding the procedure were encouraged and answered. A timeout was performed prior to the initiation of the procedure. The left flank and external portions of existing nephrostomy catheter was prepped and draped in the usual sterile fashion. A sterile drape was applied covering the operative field. Maximum barrier sterile technique with sterile gowns and gloves were used for the procedure. A timeout was performed prior to the initiation of the procedure. A pre procedural spot fluoroscopic image was obtained. A small amount of contrast was injected via the existing nephrostomy catheter demonstrating appropriate positioning within the renal pelvis. The existing nephrostomy catheter was cut and cannulated with an Amplatz wire which was coiled within the renal pelvis. Under intermittent fluoroscopic guidance, the existing nephrostomy catheter was exchanged for a new 12 [place] all-purpose drainage catheter. Limited contrast injection confirmed appropriate  positioning within the renal pelvis and a post exchange fluoroscopic image was obtained. The catheter was locked, secured to the skin with an interrupted suture and reconnected to a gravity bag. A dressing was placed. The patient tolerated the procedure well without immediate postprocedural complication. FINDINGS: The existing nephrostomy catheter is appropriately positioned and functioning. After successful fluoroscopic guided exchange, a new 12French nephrostomy catheter is coiled and locked within the renal pelvis. IMPRESSION: Successful fluoroscopic guided exchange of left 12 French percutaneous nephrostomy catheter. PLAN: Return in 6-8 weeks for routine check and exchange. Ruthann Cancer, MD [facility] Electronically Signed   By: Ruthann Cancer MD   On: 08/21/2020 12:57     ELIGIBLE FOR AVAILABLE RESEARCH PROTOCOL: *no  ASSESSMENT: 70 y.o. [name] woman with stage IV BRCA2 positive breast cancer, as follows:  (1) left axillary nodal mass biopsy 04/09/2008 shows breast cancer, estrogen receptor 100% positive, progesterone receptor 5% positive, HER-2/neu amplified,             (a) staging studies showed stage IIIc disease  (2) received epirubicin/cyclophosphamide x4 dose dense followed by docetaxel x4 between 04/16/2008 and 08/13/2018             (a) trastuzumab started with docetaxel and continued for 52 weeks  (3) left mastectomy 08/30/2008 showed persistent disease in the breast and lymph nodes.  (4) adjuvant radiation between 09/17/2008 and 11/26/2008 included the left supraclavicular and axillary nodal areas  (  5) on letrozole between 11/27/2008 and 04/20/2011, discontinued with progression             (a) left supraclavicular lymph node biopsy 04/07/2011 shows recurrent breast cancer, estrogen and progesterone receptor positive but HER-2 not amplified  (7) on tamoxifen 04/21/2011 through 06/04/2014, discontinued with  progression             (a) left supraclavicular mass biopsy again shows carcinoma invading muscle  (8) carboplatin and paclitaxel x6 cycles with good response (06/25/2014 through 10/09/2014)  (9) palbociclib and Faslodex 11/13/2014, discontinued 06/10/2015 with progression              (a) right cervical lymph node biopsy BRCA2 positive  (10) exemestane and everolimus 07/02/2015 through 08/01/2015             (a) possible pneumonitis from everolimus  (11) denosumab/Xgeva started December 2016  (12) cisplatin (40 mg/m every other week) 08/23/2015 through 12/23/2015 discontinued due to patient's desire for drug holiday  (13) olaparib 08/26/2016 through 09/08/2016--poorly tolerated             (a) ANAL CANCER resected 09/01/2016  (14) Megace 10/12/2016 through 05/11/2017, discontinued with progression             (a) repeat left posterior cervical chain node biopsy again estrogen receptor positive now progesterone receptor as well as HER-2 negative  (15) eribulin 06/01/2017 through 11/23/2017  (16) toremifene 02/11/2018 through 10/05/2018, discontinued with progression  (17) Doxil 10/18/2018 through 04/03/2019  (18) pembrolizumab 08/21/2019 through 10/22/2019  (18) carboplatin and paclitaxel 10/24/2019 through 02/23/2020  (19) started talazoparib 08/19/2020 at 2 tablets (0.5 mg) daily  (a) dose increased to 4 tablets 1.0 mg) daily beginning 08/26/2020  PLAN: Keala is tolerating thePARP inhibitor well, really with no side effect so far, and we have some initial indications of possible response.  It is still rather early.  I think what would clench it is if her cough really got better and she did not need the Percocet.  For the time being however she does need the hydrocodone and I have refilled it for her.  She also continues on the dexamethasone and Lasix.  When I speak with her next week I am going to start a Decadron taper.  She has some difficulty getting the  Talzenna refilled.  I have put the prescription in through our pharmacy and they will be assisting her so she does not run out  Total encounter time 20 minutes.Sarajane Jews C. Jaeli Grubb, MD 09/09/2020 11:38 AM Medical Oncology and Hematology [facility] [place], [place] [address] Tel. [phone]    Fax. [phone]   This document serves as a record of services personally performed by Lurline Del, MD. It was created on his behalf by Wilburn Mylar, a trained medical scribe. The creation of this record is based on the scribe's personal observations and the provider's statements to them.   I, Lurline Del MD, have reviewed the above documentation for accuracy and completeness, and I agree with the above.   *Total Encounter Time as defined by the Centers for Medicare and Medicaid Services includes, in addition to the face-to-face time of a patient visit (documented in the note above) non-face-to-face time: obtaining and reviewing outside history, ordering and reviewing medications, tests or procedures, care coordination (communications with other health care professionals or caregivers) and documentation in the medical record.

## 2020-09-09 ENCOUNTER — Inpatient Hospital Stay (HOSPITAL_BASED_OUTPATIENT_CLINIC_OR_DEPARTMENT_OTHER): Payer: Medicare Other | Admitting: Oncology

## 2020-09-09 ENCOUNTER — Other Ambulatory Visit: Payer: Self-pay | Admitting: Oncology

## 2020-09-09 ENCOUNTER — Telehealth: Payer: Self-pay | Admitting: Pharmacist

## 2020-09-09 DIAGNOSIS — C50919 Malignant neoplasm of unspecified site of unspecified female breast: Secondary | ICD-10-CM

## 2020-09-09 DIAGNOSIS — Z170 Estrogen receptor positive status [ER+]: Secondary | ICD-10-CM | POA: Diagnosis not present

## 2020-09-09 DIAGNOSIS — C50812 Malignant neoplasm of overlapping sites of left female breast: Secondary | ICD-10-CM | POA: Diagnosis not present

## 2020-09-09 DIAGNOSIS — Z1502 Genetic susceptibility to malignant neoplasm of ovary: Secondary | ICD-10-CM

## 2020-09-09 DIAGNOSIS — Z1509 Genetic susceptibility to other malignant neoplasm: Secondary | ICD-10-CM | POA: Diagnosis not present

## 2020-09-09 DIAGNOSIS — N1330 Unspecified hydronephrosis: Secondary | ICD-10-CM

## 2020-09-09 MED ORDER — TALAZOPARIB TOSYLATE 0.25 MG PO CAPS: 1.0000 mg | ORAL_CAPSULE | Freq: Every day | ORAL | 6 refills | Status: DC

## 2020-09-09 MED ORDER — HYDROCODONE-ACETAMINOPHEN 5-325 MG PO TABS: 1.0000 | ORAL_TABLET | Freq: Four times a day (QID) | ORAL | 0 refills | Status: DC | PRN

## 2020-09-09 NOTE — Telephone Encounter (Signed)
Oral Oncology Pharmacist Encounter  Prescription refill for Talzenna (talazoparib) sent to [facility] in error. Patient enrolled in manufacturer assistance and receives medication through [employer]. Prescription redirected to [facility].  Called Ms. Enge and confirmed that she received her refill from [facility] on 09/07/20. Patient confirmed that she had received the most recent shipment.   Patient stated she had multiple issues putting in refill through [employer]'s automated system. I gave Ms. Amis step by step directions for calling [employer] to set up her next refill through their automated system. Patient expressed appreciation. Confirmed with patient that she has [employer]'s phone number to call for refills [phone]).  Patient knows to reach out to oral chemotherapy clinic with any other questions or concerns regarding her Talzenna.  Leron Croak, PharmD, BCPS Hematology/Oncology Clinical Pharmacist [facility] [phone] 09/09/2020 3:33 PM

## 2020-09-10 ENCOUNTER — Telehealth: Payer: Self-pay | Admitting: Oncology

## 2020-09-10 NOTE — Telephone Encounter (Signed)
Scheduled appt per 1/31 los. Pt confirmed appt date and time.

## 2020-09-12 DIAGNOSIS — N131 Hydronephrosis with ureteral stricture, not elsewhere classified: Secondary | ICD-10-CM | POA: Diagnosis not present

## 2020-09-12 DIAGNOSIS — C50919 Malignant neoplasm of unspecified site of unspecified female breast: Secondary | ICD-10-CM | POA: Diagnosis not present

## 2020-09-12 DIAGNOSIS — C7951 Secondary malignant neoplasm of bone: Secondary | ICD-10-CM | POA: Diagnosis not present

## 2020-09-12 DIAGNOSIS — E440 Moderate protein-calorie malnutrition: Secondary | ICD-10-CM | POA: Diagnosis not present

## 2020-09-12 DIAGNOSIS — D539 Nutritional anemia, unspecified: Secondary | ICD-10-CM | POA: Diagnosis not present

## 2020-09-12 DIAGNOSIS — Z436 Encounter for attention to other artificial openings of urinary tract: Secondary | ICD-10-CM | POA: Diagnosis not present

## 2020-09-16 ENCOUNTER — Inpatient Hospital Stay: Payer: Medicare Other | Attending: Oncology | Admitting: Oncology

## 2020-09-16 DIAGNOSIS — Z85048 Personal history of other malignant neoplasm of rectum, rectosigmoid junction, and anus: Secondary | ICD-10-CM | POA: Insufficient documentation

## 2020-09-16 DIAGNOSIS — C50812 Malignant neoplasm of overlapping sites of left female breast: Secondary | ICD-10-CM

## 2020-09-16 DIAGNOSIS — C7951 Secondary malignant neoplasm of bone: Secondary | ICD-10-CM | POA: Insufficient documentation

## 2020-09-16 DIAGNOSIS — Z9012 Acquired absence of left breast and nipple: Secondary | ICD-10-CM | POA: Insufficient documentation

## 2020-09-16 DIAGNOSIS — C7989 Secondary malignant neoplasm of other specified sites: Secondary | ICD-10-CM | POA: Insufficient documentation

## 2020-09-16 DIAGNOSIS — Z7952 Long term (current) use of systemic steroids: Secondary | ICD-10-CM | POA: Insufficient documentation

## 2020-09-16 DIAGNOSIS — Z170 Estrogen receptor positive status [ER+]: Secondary | ICD-10-CM | POA: Diagnosis not present

## 2020-09-16 DIAGNOSIS — Z79899 Other long term (current) drug therapy: Secondary | ICD-10-CM | POA: Insufficient documentation

## 2020-09-16 DIAGNOSIS — C778 Secondary and unspecified malignant neoplasm of lymph nodes of multiple regions: Secondary | ICD-10-CM | POA: Insufficient documentation

## 2020-09-16 DIAGNOSIS — C50912 Malignant neoplasm of unspecified site of left female breast: Secondary | ICD-10-CM | POA: Insufficient documentation

## 2020-09-16 MED ORDER — NITROFURANTOIN MONOHYD MACRO 100 MG PO CAPS: 100.0000 mg | ORAL_CAPSULE | Freq: Every day | ORAL | 1 refills | Status: AC

## 2020-09-16 MED ORDER — HYDROCODONE-ACETAMINOPHEN 5-325 MG PO TABS: 1.0000 | ORAL_TABLET | ORAL | 0 refills | Status: AC | PRN

## 2020-09-16 NOTE — Progress Notes (Signed)
[facility]  Telephone:([phone]) [phone] Fax:(336) [phone]     ID: CLEONA DOUBLEDAY DOB: 03/07/1951  MR#: [id number]  UZJ#:[id number]  Patient Care Team: Benita Stabile, MD as PCP - General (Internal Medicine) Lowella Dell, MD OTHER MD:  I connected with Alric Quan on 09/16/20 at 12:30 PM EST by video enabled telemedicine visit and verified that I am speaking with the correct person using two identifiers.   I discussed the limitations, risks, security and privacy concerns of performing an evaluation and management service by telemedicine and the availability of in-person appointments. I also discussed with the patient that there may be a patient responsible charge related to this service. The patient expressed understanding and agreed to proceed.   Other persons participating in the visit and their role in the encounter: Patient's sister  Patient's location: Home Provider's location:  Cancer Center    CHIEF COMPLAINT: Metastatic breast cancer  CURRENT TREATMENT: talazoparib   INTERVAL HISTORY: Batya was contacted today for evaluation of her history of metastatic breast cancer.   We started her on talazoparib 08/19/2020.  After about a week we increased the dose to 4 tablets every morning.  She is tolerating that with no side effects that she is aware of.  We also started her on prednisone 20 mg daily first dose 08/20/2020, and Lasix 20 mg in the morning first dose 08/22/2020.  This does not had a marked or measurable effect on either her breathing or her cough.  REVIEW OF SYSTEMS: Willette tells me what is helping her cough and shortness of breath is the Percocet.  She was taking it 4 times a day with very good results.  Then she had 2 bad days as she puts it on 09/13/2020 on 09/14/2020.  She is not quite sure what was happening at the time but her she was more short of breath and had more coughing.  She did find that she had a urinary tract infection.  She  started herself on Macrobid (her particular supply outdated 2020) and that seems to have taking care of the urinary infection symptoms.  Note she did not have any hematuria.  Today she says her breathing is better.  She has less cough.  She also reports that the lymph nodes on her neck are "holding".  They went down a bit and have not grown back.  Despite all the narcotics her bowel movements are "fine".    COVID 19 VACCINATION STATUS:    HISTORY OF CURRENT ILLNESS: From Dellis Anes, PA-C note on 09/24/2016:  "Alric Quan 70 y.o. female returns for followup of Stage IV breast cancer ER/PR positive, HER-2/neu negative. BRCA2 POSITIVE. History of stage IIIc left breast cancer in 2009 treated with neoadjuvant chemotherapy consisting of epirubicin and Cytoxan followed by 4 cycles of docetaxel from 04/16/2008-08/13/2008 with the initiation of Herceptin for 52 weeks at the start of docetaxel. She then underwent a left mastectomy on 08/30/2008 showing persistent disease in the breast and lymph nodes. This was followed by radiation therapy from 09/17/2008-11/26/2008 to the left chest wall, supraclavicular, and axillary node areas. She then moved on to antiestrogen therapy with letrozole (11/27/2008-04/20/2011). She was found to have recurrent breast cancer on supraclavicular lymph node biopsy on 03/30/2011 leading to a change in antiestrogen therapy to tamoxifen (04/21/2011-06/04/2014). Pet imaging on 05/25/2014 demonstrated progression of left submandibular lymph node, mediastinal lymph node, and probable lung involvement. This was followed by a biopsy on 06/13/2014 of a left supraclavicular  mass that did reveal metastatic carcinoma consistent with breast primary. She was then treated with carboplatin and Taxol for 6 cycles with an excellent response (06/25/2014-10/09/2014). She was then transition back to hormone therapy consisting of Ibrance and Faslodex (11/13/2014-06/10/2015). This was complicated by  grade 3 neutropenia with cycle #2 resulting in a dose reduction of Ibrance to 100 mg for cycle 3. Unfortunately, on 06/10/2015, she was noted to have progression of disease in the right mid cervical lymph node over a 4 day period that was PET avid. Therapy was therefore changed to exemestane and everolimus (81/44/8185-63/14/9702) that was complicated by an adverse reaction consisting of diffuse pneumonitis. This regimen was therefore discontinued. She then transitioned back to systemic chemotherapy consisting of cisplatin every 14 days (08/23/2015-12/23/2015). She was put on a drug holiday beginning on 12/23/2015 in preparation for a trip to [place] on 01/25/2016. She was on Xeloda maintenance in a 7 day on and 7 day off fashion beginning on 04/20/2016, but progression of disease was noted in cervical lymph node.  She is NOW on [place] beginning on 08/26/2016. AND squamous cell carcinoma of anus, HPV POSITIVE, S/P XRT with Xeloda concomitantly in the neoadjuvant setting (02/07/2016- 03/10/2016).  S/P APR by Dr. Leighton Ruff on 6/37/8588, demonstrating a partial response to therapy, Stage IIA (T2N0M0).  Initial medical oncology recommendations provided by Dr. Tressie Stalker at [facility]"  The patient's subsequent history is as detailed below.   PAST MEDICAL HISTORY: Past Medical History:  Diagnosis Date  . Adenocarcinoma of breast ([place]) 01/27/2011   stage IIIc breast cancer  . Bladder infection   . Bone metastasis ([place]) 01/17/2016  . Borderline hypertension   . Breast cancer ([place])   . Closed fracture of humerus sept 2013  . Depression   . Family history of adverse reaction to anesthesia    father remembered his surgery  . Fatigue   . GERD (gastroesophageal reflux disease)   . Hypertension   . 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 Osteoporosis 01/27/2011  . Pneumonia   . Port catheter in place 04/14/2013  . Post-mastectomy lymphedema syndrome   . Recurrent UTI 01/17/2016   . Reflux   . Retinal detachment   . 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 Swollen lymph nodes   . URI (upper respiratory infection) 02/17/12  . Wears glasses     PAST SURGICAL HISTORY: Past Surgical History:  Procedure Laterality Date  . ABDOMINAL PERINEAL BOWEL RESECTION N/A 09/01/2016   Procedure: ABDOMINAL PERINEAL RESECTION PERMANENT COLOSTOMY;  Surgeon: Leighton Ruff, MD;  Location: [facility];  Service: General;  Laterality: N/A;  . biopsy of lymph node     super clavicle  . BTL    . CATARACT EXTRACTION    . cervical disc fusioni    . CYSTOSCOPY W/ URETERAL STENT PLACEMENT Bilateral 06/03/2020   Procedure: CYSTOSCOPY WITH RETROGRADE PYELOGRAM/URETERAL STENT PLACEMENT bladder biopsy;  Surgeon: [name], MD;  Location: [facility];  Service: Urology;  Laterality: Bilateral;  . CYSTOSCOPY WITH STENT PLACEMENT Left 06/07/2020   Procedure: CYSTOSCOPY LEFT STENT EXCHANGE, retrograde pylogram;  Surgeon: [name], MD;  Location: [facility];  Service: Urology;  Laterality: Left;  . IR NEPHROSTOMY EXCHANGE LEFT  06/26/2020  . IR NEPHROSTOMY EXCHANGE LEFT  08/21/2020  . IR NEPHROSTOMY PLACEMENT LEFT  06/17/2020  . IR RADIOLOGIST EVAL & MGMT  07/02/2020  . IR TRANSCATH RETRIEVAL  FB INCL GUIDANCE (MS)  06/26/2020  . LYMPH NODE BIOPSY Right 06/19/2015   Procedure: RIGHT NECK LYMPH NODE EXCISION;  Surgeon: Rolm Bookbinder, MD;  Location: [facility];  Service: General;  Laterality: Right;  . LYMPH NODE DISSECTION Left 04/26/2017   Procedure: EXCISION LEFT NECK LYMPH NODE;  Surgeon: Rolm Bookbinder, MD;  Location: [facility];  Service: General;  Laterality: Left;  Marland Kitchen MASS EXCISION Left 07/27/2016   Procedure: EXCISION OF POSTERIOR NECK MASS;  Surgeon: Rolm Bookbinder, MD;  Location: [facility];  Service: General;  Laterality: Left;  Marland Kitchen MASTECTOMY MODIFIED RADICAL     left breast  . Port- a-cath  insertion    . retinal detachment and repair      FAMILY HISTORY: Family History  Problem Relation Age of Onset  . Hypertension Mother   . Heart disease Mother        atrial fib  . Heart disease Father   . 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     ADVANCED DIRECTIVES:    HEALTH MAINTENANCE: Social History   Tobacco Use  . Smoking status: Never Smoker  . Smokeless tobacco: Never Used  Vaping Use  . Vaping Use: Never used  Substance Use Topics  . Alcohol use: Yes    Alcohol/week: 7.0 standard drinks    Types: 7 Glasses of wine per week    Comment: 1 drink daily  . Drug use: Yes    Types: Marijuana    Comment: During chemo, none over 1 Year     Colonoscopy:   PAP:   Bone density:    Allergies  Allergen Reactions  . Ciprofloxacin Other (See Comments)    tendonitis  . Prochlorperazine Anxiety    Other reaction(s): Confusion    Current Outpatient Medications  Medication Sig Dispense Refill  . nitrofurantoin, macrocrystal-monohydrate, (MACROBID) 100 MG capsule Take 1 capsule (100 mg total) by mouth daily. 20 capsule 1  . albuterol (VENTOLIN HFA) 108 (90 Base) MCG/ACT inhaler Inhale 2 puffs into the lungs daily as needed for wheezing or shortness of breath.    . benzonatate (TESSALON) 100 MG capsule Take 1 capsule (100 mg total) by mouth 2 (two) times daily. (Patient not taking: No sig reported) 30 capsule 0  . buPROPion (WELLBUTRIN SR) 100 MG 12 hr tablet Take 100 mg by mouth 2 (two) times daily.    Marland Kitchen CALCIUM-VITAMIN D PO Take 600 mg by mouth 2 (two) times daily.    Marland Kitchen denosumab (XGEVA) 120 MG/1.7ML SOLN injection Inject 120 mg into the skin every 3 (three) months.    . docusate sodium (COLACE) 100 MG capsule Take 1 capsule (100 mg total) by mouth daily. 10 capsule 0  . furosemide (LASIX) 20 MG tablet Take 1 tablet (20 mg  total) by mouth daily. 30 tablet 3  . gabapentin (NEURONTIN) 300 MG capsule Take 1 capsule (300 mg total) by mouth in the morning, at noon, in the evening, and at bedtime. 120 capsule 4  . HYDROcodone-acetaminophen (NORCO/VICODIN) 5-325 MG tablet Take 1-2 tablets by mouth every 4 (four) hours as needed. 360 tablet 0  . metoprolol succinate (TOPROL-XL) 25 MG 24 hr tablet Take 25 mg by mouth in the morning and at bedtime.   5  . Multiple Vitamin (MULTIVITAMIN WITH  MINERALS) TABS tablet Take 1 tablet by mouth daily. 90 tablet 0  . omeprazole (PRILOSEC) 40 MG capsule Take 1 capsule (40 mg total) by mouth 2 (two) times daily. 180 capsule 4  . polyethylene glycol (MIRALAX / GLYCOLAX) 17 g packet Take 17 g by mouth daily. 14 each 0  . potassium chloride (KLOR-CON) 10 MEQ tablet Take 2 tablets (20 mEq total) by mouth 2 (two) times daily. 120 tablet 6  . predniSONE (DELTASONE) 20 MG tablet Take 1 tablet (20 mg total) by mouth daily with breakfast. 60 tablet 3  . prochlorperazine (COMPAZINE) 5 MG tablet Take 1 tablet (5 mg total) by mouth 4 (four) times daily -  before meals and at bedtime. (Patient not taking: No sig reported) 90 tablet 4  . promethazine-codeine (PHENERGAN WITH CODEINE) 6.25-10 MG/5ML syrup Take 5 mLs by mouth every 6 (six) hours as needed for cough. 120 mL 0  . sodium chloride 0.9 % infusion Inject 1,000 mLs into the vein daily. 30000 mL 0  . talazoparib tosylate (TALZENNA) 0.25 MG capsule Take 4 capsules (1 mg total) by mouth daily. To start 07/01/2020 120 capsule 6  . tamsulosin (FLOMAX) 0.4 MG CAPS capsule Take 0.4 mg by mouth 2 (two) times daily.    . vitamin B-12 (CYANOCOBALAMIN) 1000 MCG tablet Take 1,000 mcg by mouth 2 (two) times daily.      No current facility-administered medications for this visit.    OBJECTIVE:   There were no vitals filed for this visit.   There is no height or weight on file to calculate BMI.   Wt Readings from Last 3 Encounters:  08/21/20 128 lb (58.1  kg)  07/09/20 120 lb (54.4 kg)  06/17/20 123 lb 4.8 oz (55.9 kg)     ECOG FS: 2  Telemedicine visit 09/16/2020   LAB RESULTS:  CMP     Component Value Date/Time   NA 139 06/22/2020 0311   K 3.9 06/22/2020 0311   CL 107 06/22/2020 0311   CO2 22 06/22/2020 0311   GLUCOSE 92 06/22/2020 0311   BUN 14 06/22/2020 0311   CREATININE 1.72 (H) 06/22/2020 0311   CALCIUM 7.9 (L) 06/22/2020 0311   PROT 6.3 (L) 06/20/2020 0841   ALBUMIN 2.8 (L) 06/22/2020 0311   AST 14 (L) 06/20/2020 0841   ALT 7 06/20/2020 0841   ALKPHOS 57 06/20/2020 0841   BILITOT 0.4 06/20/2020 0841   GFRNONAA 32 (L) 06/22/2020 0311   GFRAA >60 10/07/2016 0834    No results found for: TOTALPROTELP, ALBUMINELP, A1GS, A2GS, BETS, BETA2SER, GAMS, MSPIKE, SPEI  Lab Results  Component Value Date   WBC 5.5 06/20/2020   NEUTROABS 10.0 (H) 06/17/2020   HGB 8.2 (L) 06/20/2020   HCT 25.3 (L) 06/20/2020   MCV 102.4 (H) 06/20/2020   PLT 315 06/20/2020    Lab Results  Component Value Date   LABCA2 33.2 08/11/2016    No components found for: RUEAVW098  No results for input(s): INR in the last 168 hours.  Lab Results  Component Value Date   LABCA2 33.2 08/11/2016    No results found for: CAN199  No results found for: JXB147  Lab Results  Component Value Date   CAN153 26.4 (H) 08/11/2016    No results found for: CA2729  No components found for: HGQUANT  No results found for: CEA1 / No results found for: CEA1   No results found for: AFPTUMOR  No results found for: [place]  No results found for:  KPAFRELGTCHN, LAMBDASER, KAPLAMBRATIO (kappa/lambda light chains)  No results found for: HGBA, HGBA2QUANT, HGBFQUANT, HGBSQUAN (Hemoglobinopathy evaluation)   Lab Results  Component Value Date   LDH 176 04/27/2008    Lab Results  Component Value Date   IRON 79 10/07/2016   TIBC 277 10/07/2016   IRONPCTSAT 28 10/07/2016   (Iron and TIBC)  Lab Results  Component Value Date   FERRITIN 156  10/07/2016    Urinalysis    Component Value Date/Time   COLORURINE STRAW (A) 06/03/2020 1613   APPEARANCEUR CLEAR 06/03/2020 1613   LABSPEC 1.008 06/03/2020 1613   PHURINE 6.0 06/03/2020 1613   GLUCOSEU NEGATIVE 06/03/2020 1613   HGBUR NEGATIVE 06/03/2020 1613   BILIRUBINUR NEGATIVE 06/03/2020 1613   KETONESUR NEGATIVE 06/03/2020 1613   PROTEINUR NEGATIVE 06/03/2020 1613   UROBILINOGEN 0.2 08/06/2008 1500   NITRITE NEGATIVE 06/03/2020 1613   LEUKOCYTESUR NEGATIVE 06/03/2020 1613    STUDIES: IR NEPHROSTOMY EXCHANGE LEFT  Result Date: 08/21/2020 INDICATION: 70 year old female with history of metastatic breast cancer status post bilateral ureteral stent placement with malfunctioning left renal stent requiring percutaneous nephrostomy placement on 06/17/2020. Most recent exchange/upsize on 06/26/2020. The patient presents today for routine check and exchange. EXAM: FLUOROSCOPIC GUIDED left SIDED NEPHROSTOMY CATHETER EXCHANGE COMPARISON:  06/26/2020 CONTRAST:  A total of 5 mL Isovue-300 administered was administered into the collecting system FLUOROSCOPY TIME:  0.6 minutes, 1 mGy COMPLICATIONS: None immediate. TECHNIQUE: Informed written consent was obtained from the patient after a discussion of the risks, benefits and alternatives to treatment. Questions regarding the procedure were encouraged and answered. A timeout was performed prior to the initiation of the procedure. The left flank and external portions of existing nephrostomy catheter was prepped and draped in the usual sterile fashion. A sterile drape was applied covering the operative field. Maximum barrier sterile technique with sterile gowns and gloves were used for the procedure. A timeout was performed prior to the initiation of the procedure. A pre procedural spot fluoroscopic image was obtained. A small amount of contrast was injected via the existing nephrostomy catheter demonstrating appropriate positioning within the renal  pelvis. The existing nephrostomy catheter was cut and cannulated with an Amplatz wire which was coiled within the renal pelvis. Under intermittent fluoroscopic guidance, the existing nephrostomy catheter was exchanged for a new 12 [place] all-purpose drainage catheter. Limited contrast injection confirmed appropriate positioning within the renal pelvis and a post exchange fluoroscopic image was obtained. The catheter was locked, secured to the skin with an interrupted suture and reconnected to a gravity bag. A dressing was placed. The patient tolerated the procedure well without immediate postprocedural complication. FINDINGS: The existing nephrostomy catheter is appropriately positioned and functioning. After successful fluoroscopic guided exchange, a new 12French nephrostomy catheter is coiled and locked within the renal pelvis. IMPRESSION: Successful fluoroscopic guided exchange of left 12 French percutaneous nephrostomy catheter. PLAN: Return in 6-8 weeks for routine check and exchange. Ruthann Cancer, MD [facility] Electronically Signed   By: Ruthann Cancer MD   On: 08/21/2020 12:57     ELIGIBLE FOR AVAILABLE RESEARCH PROTOCOL: *no  ASSESSMENT: 70 y.o. [place] woman with stage IV BRCA2 positive breast cancer, as follows:  (1) left axillary nodal mass biopsy 04/09/2008 shows breast cancer, estrogen receptor 100% positive, progesterone receptor 5% positive, HER-2/neu amplified,             (a) staging studies showed stage IIIc disease  (2) received epirubicin/cyclophosphamide x4 dose dense followed by docetaxel x4  between 04/16/2008 and 08/13/2018             (a) trastuzumab started with docetaxel and continued for 52 weeks  (3) left mastectomy 08/30/2008 showed persistent disease in the breast and lymph nodes.  (4) adjuvant radiation between 09/17/2008 and 11/26/2008 included the left supraclavicular and axillary nodal areas  (5)  on letrozole between 11/27/2008 and 04/20/2011, discontinued with progression             (a) left supraclavicular lymph node biopsy 04/07/2011 shows recurrent breast cancer, estrogen and progesterone receptor positive but HER-2 not amplified  (7) on tamoxifen 04/21/2011 through 06/04/2014, discontinued with progression             (a) left supraclavicular mass biopsy again shows carcinoma invading muscle  (8) carboplatin and paclitaxel x6 cycles with good response (06/25/2014 through 10/09/2014)  (9) palbociclib and Faslodex 11/13/2014, discontinued 06/10/2015 with progression              (a) right cervical lymph node biopsy BRCA2 positive  (10) exemestane and everolimus 07/02/2015 through 08/01/2015             (a) possible pneumonitis from everolimus  (11) denosumab/Xgeva started December 2016  (12) cisplatin (40 mg/m every other week) 08/23/2015 through 12/23/2015 discontinued due to patient's desire for drug holiday  (13) olaparib 08/26/2016 through 09/08/2016--poorly tolerated             (a) ANAL CANCER resected 09/01/2016  (14) Megace 10/12/2016 through 05/11/2017, discontinued with progression             (a) repeat left posterior cervical chain node biopsy again estrogen receptor positive now progesterone receptor as well as HER-2 negative  (15) eribulin 06/01/2017 through 11/23/2017  (16) toremifene 02/11/2018 through 10/05/2018, discontinued with progression  (17) Doxil 10/18/2018 through 04/03/2019  (18) pembrolizumab 08/21/2019 through 10/22/2019  (18) carboplatin and paclitaxel 10/24/2019 through 02/23/2020  (19) started talazoparib 08/19/2020 at 2 tablets (0.5 mg) daily  (a) dose increased to 4 tablets 1.0 mg) daily beginning 08/26/2020   PLAN: Corynne continues to tolerate the PA RP inhibitor well.  She is coming up on a month of starting full dose.  There are some initial indications of response.  I am hoping we will see some measurable response  by March when we repeat her scans.  In the meantime the only thing that really clearly helps her is the Percocet.  She had to increase the dose this past weekend.  I have written her a large prescription so she will not run out.  Despite the narcotics she is having no problems with constipation.  I will continue to check in with her virtually on a weekly basis.  If we can document response of course the plan would be to continue the talazoparib but if we document disease progression I think we will have to move to a hospice situation   Sarajane Jews C. Brigetta Beckstrom, MD 09/16/2020 6:41 PM Medical Oncology and Hematology [facility] [place], [place] [address] Tel. [phone]    Fax. [phone]   This document serves as a record of services personally performed by Lurline Del, MD. It was created on his behalf by Wilburn Mylar, a trained medical scribe. The creation of this record is based on the scribe's personal observations and the provider's statements to them.   I, Lurline Del MD, have reviewed the above documentation for accuracy and completeness, and I agree with the above.   *Total Encounter Time  as defined by the Centers for Medicare and Medicaid Services includes, in addition to the face-to-face time of a patient visit (documented in the note above) non-face-to-face time: obtaining and reviewing outside history, ordering and reviewing medications, tests or procedures, care coordination (communications with other health care professionals or caregivers) and documentation in the medical record.

## 2020-09-18 ENCOUNTER — Telehealth: Payer: Self-pay

## 2020-09-18 NOTE — Telephone Encounter (Signed)
Pt called to review Macrobid Rx for possible UTI.    Per patient she normally takes Macrobid 100mg  BID, Rx was written for once daily and she has not seen improvement yet.   RN reviewed with MD - MD OK for Macrobid 100mg  BID X 5 days.  Pt to report if symptoms do not improve.  Pt aware verbalized understanding and agreement.

## 2020-09-20 ENCOUNTER — Telehealth: Payer: Self-pay | Admitting: *Deleted

## 2020-09-20 ENCOUNTER — Other Ambulatory Visit: Payer: Self-pay | Admitting: *Deleted

## 2020-09-20 DIAGNOSIS — D539 Nutritional anemia, unspecified: Secondary | ICD-10-CM | POA: Diagnosis not present

## 2020-09-20 DIAGNOSIS — Z436 Encounter for attention to other artificial openings of urinary tract: Secondary | ICD-10-CM | POA: Diagnosis not present

## 2020-09-20 DIAGNOSIS — C50919 Malignant neoplasm of unspecified site of unspecified female breast: Secondary | ICD-10-CM | POA: Diagnosis not present

## 2020-09-20 DIAGNOSIS — C7951 Secondary malignant neoplasm of bone: Secondary | ICD-10-CM | POA: Diagnosis not present

## 2020-09-20 DIAGNOSIS — N131 Hydronephrosis with ureteral stricture, not elsewhere classified: Secondary | ICD-10-CM | POA: Diagnosis not present

## 2020-09-20 DIAGNOSIS — Z170 Estrogen receptor positive status [ER+]: Secondary | ICD-10-CM

## 2020-09-20 DIAGNOSIS — C50812 Malignant neoplasm of overlapping sites of left female breast: Secondary | ICD-10-CM

## 2020-09-20 DIAGNOSIS — E440 Moderate protein-calorie malnutrition: Secondary | ICD-10-CM | POA: Diagnosis not present

## 2020-09-20 NOTE — Progress Notes (Signed)
SATURATION QUALIFICATIONS: (This note is used to comply with regulatory documentation for home oxygen)  Patient Saturations on Room Air at Rest = 85%   

## 2020-09-20 NOTE — Telephone Encounter (Signed)
Pt denies any other symptoms.  RN called Zack with [facility]. O2 2 liters ordered.

## 2020-09-20 NOTE — Telephone Encounter (Signed)
Hey Tammi,  Please reach out to patient and ensure she isn't having any fever, chills, cough, sore throat, chest pain, etc.  If not ok to set this up.  Thanks, Mendel Ryder

## 2020-09-20 NOTE — Telephone Encounter (Signed)
[name] left a message stating her O2 sats last night were 85-91% while she was sitting in her chair. She gets extremely SOB with any ambulation. Wants to know if Dr Jana Hakim will prescribe oxygen for her .

## 2020-09-20 NOTE — Telephone Encounter (Signed)
Thank you Tammi!  Mendel Ryder

## 2020-09-21 DIAGNOSIS — Z436 Encounter for attention to other artificial openings of urinary tract: Secondary | ICD-10-CM | POA: Diagnosis not present

## 2020-09-21 DIAGNOSIS — N1832 Chronic kidney disease, stage 3b: Secondary | ICD-10-CM | POA: Diagnosis not present

## 2020-09-21 DIAGNOSIS — E440 Moderate protein-calorie malnutrition: Secondary | ICD-10-CM | POA: Diagnosis not present

## 2020-09-21 DIAGNOSIS — I129 Hypertensive chronic kidney disease with stage 1 through stage 4 chronic kidney disease, or unspecified chronic kidney disease: Secondary | ICD-10-CM | POA: Diagnosis not present

## 2020-09-21 DIAGNOSIS — Z7952 Long term (current) use of systemic steroids: Secondary | ICD-10-CM | POA: Diagnosis not present

## 2020-09-21 DIAGNOSIS — Z933 Colostomy status: Secondary | ICD-10-CM | POA: Diagnosis not present

## 2020-09-21 DIAGNOSIS — G620 Drug-induced polyneuropathy: Secondary | ICD-10-CM | POA: Diagnosis not present

## 2020-09-21 DIAGNOSIS — C50919 Malignant neoplasm of unspecified site of unspecified female breast: Secondary | ICD-10-CM | POA: Diagnosis not present

## 2020-09-21 DIAGNOSIS — F32A Depression, unspecified: Secondary | ICD-10-CM | POA: Diagnosis not present

## 2020-09-21 DIAGNOSIS — T451X5D Adverse effect of antineoplastic and immunosuppressive drugs, subsequent encounter: Secondary | ICD-10-CM | POA: Diagnosis not present

## 2020-09-21 DIAGNOSIS — M810 Age-related osteoporosis without current pathological fracture: Secondary | ICD-10-CM | POA: Diagnosis not present

## 2020-09-21 DIAGNOSIS — D539 Nutritional anemia, unspecified: Secondary | ICD-10-CM | POA: Diagnosis not present

## 2020-09-21 DIAGNOSIS — C7951 Secondary malignant neoplasm of bone: Secondary | ICD-10-CM | POA: Diagnosis not present

## 2020-09-21 DIAGNOSIS — N179 Acute kidney failure, unspecified: Secondary | ICD-10-CM | POA: Diagnosis not present

## 2020-09-21 DIAGNOSIS — K219 Gastro-esophageal reflux disease without esophagitis: Secondary | ICD-10-CM | POA: Diagnosis not present

## 2020-09-21 DIAGNOSIS — Z79899 Other long term (current) drug therapy: Secondary | ICD-10-CM | POA: Diagnosis not present

## 2020-09-21 DIAGNOSIS — N131 Hydronephrosis with ureteral stricture, not elsewhere classified: Secondary | ICD-10-CM | POA: Diagnosis not present

## 2020-09-21 DIAGNOSIS — Z85048 Personal history of other malignant neoplasm of rectum, rectosigmoid junction, and anus: Secondary | ICD-10-CM | POA: Diagnosis not present

## 2020-09-21 DIAGNOSIS — E860 Dehydration: Secondary | ICD-10-CM | POA: Diagnosis not present

## 2020-09-21 DIAGNOSIS — F419 Anxiety disorder, unspecified: Secondary | ICD-10-CM | POA: Diagnosis not present

## 2020-09-25 ENCOUNTER — Inpatient Hospital Stay (HOSPITAL_BASED_OUTPATIENT_CLINIC_OR_DEPARTMENT_OTHER): Payer: Medicare Other | Admitting: Oncology

## 2020-09-25 DIAGNOSIS — Z1509 Genetic susceptibility to other malignant neoplasm: Secondary | ICD-10-CM

## 2020-09-25 DIAGNOSIS — C50919 Malignant neoplasm of unspecified site of unspecified female breast: Secondary | ICD-10-CM | POA: Diagnosis not present

## 2020-09-25 DIAGNOSIS — C50812 Malignant neoplasm of overlapping sites of left female breast: Secondary | ICD-10-CM | POA: Diagnosis not present

## 2020-09-25 DIAGNOSIS — Z170 Estrogen receptor positive status [ER+]: Secondary | ICD-10-CM | POA: Diagnosis not present

## 2020-09-25 DIAGNOSIS — Z1502 Genetic susceptibility to malignant neoplasm of ovary: Secondary | ICD-10-CM

## 2020-09-25 NOTE — Progress Notes (Signed)
[place]  Telephone:([phone] Fax:(336) [phone]     ID: [name] DOB: 02-Feb-1951  MR#: [id number]  KZS#:[id number]  Patient Care Team: Celene Squibb, MD as PCP - General (Internal Medicine) Chauncey Cruel, MD OTHER MD:  I connected with Teofilo Pod on 09/25/20 at  4:15 PM EST by video enabled telemedicine visit and verified that I am speaking with the correct person using two identifiers.   I discussed the limitations, risks, security and privacy concerns of performing an evaluation and management service by telemedicine and the availability of in-person appointments. I also discussed with the patient that there may be a patient responsible charge related to this service. The patient expressed understanding and agreed to proceed.   Other persons participating in the visit and their role in the encounter: Patient's sister  Patient's location: Home Provider's location: [place]  Total time: 20 minutes.   CHIEF COMPLAINT: Metastatic breast cancer  CURRENT TREATMENT: discontinuing talazoparib   INTERVAL HISTORY: [name] was contacted today for evaluation of her history of metastatic breast cancer.  Her sister participated in the conversation.  We started [place] on talazoparib 08/19/2020.  After about a week we increased the dose to 4 tablets every morning.  She tolerated that with no side effects that she is aware of.  Initially she seemed to be having a response in some of her neck lymph nodes got a little bit smaller.  However now she has more lymph nodes some of the ones that shrank are now growing, and more importantly she is progressively short of breath.  She has repeated oxygen saturation readings in the low 80s with minimal activity and she did get oxygen started pretty much 24/7 at home this past weekend.  We also started her on prednisone 20 mg daily first dose 08/20/2020, and Lasix 20 mg in the morning first dose 08/22/2020.   This has not had a marked or measurable effect on either her breathing or her cough.   REVIEW OF SYSTEMS: [name] finds that taking hydrocodone is helpful to her breathing.  She had to up the dose to 1 tablet every 4 hours.  When she did that however even though the breathing was better she started having what she calls hallucinations.  The room looked green to her.  She saw a little man in the bathroom.  Both these things immediately cleared when she actually focused on them and she was quite aware that they were not really there so she was not delusional or psychotic.  Because of this she drop the dose that she is now taking 1-1/2 tablets every 5 hours.  This is barely adequate to control her breathing problems--pain is not the issue--and they are interested in increasing the oxygen a little bit to see if they can keep the saturation in the 90% range.   COVID 19 VACCINATION STATUS:    HISTORY OF CURRENT ILLNESS: From Robynn Pane, PA-C note on 09/24/2016:  "Teofilo Pod 70 y.o. female returns for followup of Stage IV breast cancer ER/PR positive, HER-2/neu negative. BRCA2 POSITIVE. History of stage IIIc left breast cancer in 2009 treated with neoadjuvant chemotherapy consisting of epirubicin and Cytoxan followed by 4 cycles of docetaxel from 04/16/2008-08/13/2008 with the initiation of Herceptin for 52 weeks at the start of docetaxel. She then underwent a left mastectomy on 08/30/2008 showing persistent disease in the breast and lymph nodes. This was followed by radiation therapy from 09/17/2008-11/26/2008 to the left  chest wall, supraclavicular, and axillary node areas. She then moved on to antiestrogen therapy with letrozole (11/27/2008-04/20/2011). She was found to have recurrent breast cancer on supraclavicular lymph node biopsy on 03/30/2011 leading to a change in antiestrogen therapy to tamoxifen (04/21/2011-06/04/2014). Pet imaging on 05/25/2014 demonstrated progression of left submandibular  lymph node, mediastinal lymph node, and probable lung involvement. This was followed by a biopsy on 06/13/2014 of a left supraclavicular mass that did reveal metastatic carcinoma consistent with breast primary. She was then treated with carboplatin and Taxol for 6 cycles with an excellent response (06/25/2014-10/09/2014). She was then transition back to hormone therapy consisting of Ibrance and Faslodex (11/13/2014-06/10/2015). This was complicated by grade 3 neutropenia with cycle #2 resulting in a dose reduction of Ibrance to 100 mg for cycle 3. Unfortunately, on 06/10/2015, she was noted to have progression of disease in the right mid cervical lymph node over a 4 day period that was PET avid. Therapy was therefore changed to exemestane and everolimus (37/11/8887-16/94/5038) that was complicated by an adverse reaction consisting of diffuse pneumonitis. This regimen was therefore discontinued. She then transitioned back to systemic chemotherapy consisting of cisplatin every 14 days (08/23/2015-12/23/2015). She was put on a drug holiday beginning on 12/23/2015 in preparation for a trip to [place] on 01/25/2016. She was on Xeloda maintenance in a 7 day on and 7 day off fashion beginning on 04/20/2016, but progression of disease was noted in cervical lymph node.  She is NOW on [place] beginning on 08/26/2016. AND squamous cell carcinoma of anus, HPV POSITIVE, S/P XRT with Xeloda concomitantly in the neoadjuvant setting (02/07/2016- 03/10/2016).  S/P APR by Dr. Leighton Ruff on 8/82/8003, demonstrating a partial response to therapy, Stage IIA (T2N0M0).  Initial medical oncology recommendations provided by Dr. Tressie Stalker at [facility]"  The patient's subsequent history is as detailed below.   PAST MEDICAL HISTORY: Past Medical History:  Diagnosis Date  . Adenocarcinoma of breast ([place]) 01/27/2011   stage IIIc breast cancer  . Bladder infection   . Bone metastasis ([place]) 01/17/2016  . Borderline hypertension   . Breast  cancer ([place])   . Closed fracture of humerus sept 2013  . Depression   . Family history of adverse reaction to anesthesia    father remembered his surgery  . Fatigue   . GERD (gastroesophageal reflux disease)   . Hypertension   . 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 Osteoporosis 01/27/2011  . Pneumonia   . Port catheter in place 04/14/2013  . Post-mastectomy lymphedema syndrome   . 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 Reflux   . Retinal detachment   . 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 Swollen lymph nodes   . URI (upper respiratory infection) 02/17/12  . Wears glasses     PAST SURGICAL HISTORY: Past Surgical History:  Procedure Laterality Date  . ABDOMINAL PERINEAL BOWEL RESECTION N/A 09/01/2016   Procedure: ABDOMINAL PERINEAL RESECTION PERMANENT COLOSTOMY;  Surgeon: Leighton Ruff, MD;  Location: [facility];  Service: General;  Laterality: N/A;  . biopsy of lymph node     super clavicle  . BTL    . CATARACT EXTRACTION    . cervical disc fusioni    . CYSTOSCOPY W/ URETERAL STENT PLACEMENT Bilateral 06/03/2020   Procedure: CYSTOSCOPY WITH RETROGRADE PYELOGRAM/URETERAL STENT PLACEMENT bladder biopsy;  Surgeon: [name], MD;  Location: [facility];  Service: Urology;  Laterality:  Bilateral;  . CYSTOSCOPY WITH STENT PLACEMENT Left 06/07/2020   Procedure: CYSTOSCOPY LEFT STENT EXCHANGE, retrograde pylogram;  Surgeon: [name], MD;  Location: [facility];  Service: Urology;  Laterality: Left;  . IR NEPHROSTOMY EXCHANGE LEFT  06/26/2020  . IR NEPHROSTOMY EXCHANGE LEFT  08/21/2020  . IR NEPHROSTOMY PLACEMENT LEFT  06/17/2020  . IR RADIOLOGIST EVAL & MGMT  07/02/2020  . IR TRANSCATH RETRIEVAL FB INCL GUIDANCE (MS)  06/26/2020  . LYMPH NODE BIOPSY Right 06/19/2015   Procedure: RIGHT NECK LYMPH NODE EXCISION;  Surgeon: Rolm Bookbinder, MD;  Location: [place];  Service:  General;  Laterality: Right;  . LYMPH NODE DISSECTION Left 04/26/2017   Procedure: EXCISION LEFT NECK LYMPH NODE;  Surgeon: Rolm Bookbinder, MD;  Location: [place];  Service: General;  Laterality: Left;  Marland Kitchen MASS EXCISION Left 07/27/2016   Procedure: EXCISION OF POSTERIOR NECK MASS;  Surgeon: Rolm Bookbinder, MD;  Location: [place];  Service: General;  Laterality: Left;  Marland Kitchen MASTECTOMY MODIFIED RADICAL     left breast  . Port- a-cath insertion    . retinal detachment and repair      FAMILY HISTORY: Family History  Problem Relation Age of Onset  . Hypertension Mother   . Heart disease Mother        atrial fib  . Heart disease Father   . 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     ADVANCED DIRECTIVES:    HEALTH MAINTENANCE: Social History   Tobacco Use  . Smoking status: Never Smoker  . Smokeless tobacco: Never Used  Vaping Use  . Vaping Use: Never used  Substance Use Topics  . Alcohol use: Yes    Alcohol/week: 7.0 standard drinks    Types: 7 Glasses of wine per week    Comment: 1 drink daily  . Drug use: Yes    Types: Marijuana    Comment: During chemo, none over 1 Year     Colonoscopy:   PAP:   Bone density:    Allergies  Allergen Reactions  . Ciprofloxacin Other (See Comments)    tendonitis  . Prochlorperazine Anxiety    Other reaction(s): Confusion    Current Outpatient Medications  Medication Sig Dispense Refill  . albuterol (VENTOLIN HFA) 108 (90 Base) MCG/ACT inhaler Inhale 2 puffs into the lungs daily as needed for wheezing or shortness of breath.    . benzonatate (TESSALON) 100 MG capsule Take 1 capsule (100 mg total) by mouth 2 (two) times daily. (Patient not taking: No sig reported) 30 capsule 0  . buPROPion (WELLBUTRIN SR) 100 MG 12 hr tablet Take 100 mg by mouth 2 (two) times daily.    Marland Kitchen  CALCIUM-VITAMIN D PO Take 600 mg by mouth 2 (two) times daily.    Marland Kitchen denosumab (XGEVA) 120 MG/1.7ML SOLN injection Inject 120 mg into the skin every 3 (three) months.    . docusate sodium (COLACE) 100 MG capsule Take 1 capsule (100 mg total) by mouth daily. 10 capsule 0  . furosemide (LASIX) 20 MG tablet Take 1 tablet (20 mg total) by mouth daily. 30 tablet 3  . gabapentin (NEURONTIN) 300 MG capsule Take 1 capsule (300 mg total) by mouth in the morning, at noon, in the evening, and at bedtime. 120 capsule  4  . HYDROcodone-acetaminophen (NORCO/VICODIN) 5-325 MG tablet Take 1-2 tablets by mouth every 4 (four) hours as needed. 360 tablet 0  . metoprolol succinate (TOPROL-XL) 25 MG 24 hr tablet Take 25 mg by mouth in the morning and at bedtime.   5  . Multiple Vitamin (MULTIVITAMIN WITH MINERALS) TABS tablet Take 1 tablet by mouth daily. 90 tablet 0  . nitrofurantoin, macrocrystal-monohydrate, (MACROBID) 100 MG capsule Take 1 capsule (100 mg total) by mouth daily. 20 capsule 1  . omeprazole (PRILOSEC) 40 MG capsule Take 1 capsule (40 mg total) by mouth 2 (two) times daily. 180 capsule 4  . polyethylene glycol (MIRALAX / GLYCOLAX) 17 g packet Take 17 g by mouth daily. 14 each 0  . potassium chloride (KLOR-CON) 10 MEQ tablet Take 2 tablets (20 mEq total) by mouth 2 (two) times daily. 120 tablet 6  . predniSONE (DELTASONE) 20 MG tablet Take 1 tablet (20 mg total) by mouth daily with breakfast. 60 tablet 3  . prochlorperazine (COMPAZINE) 5 MG tablet Take 1 tablet (5 mg total) by mouth 4 (four) times daily -  before meals and at bedtime. (Patient not taking: No sig reported) 90 tablet 4  . promethazine-codeine (PHENERGAN WITH CODEINE) 6.25-10 MG/5ML syrup Take 5 mLs by mouth every 6 (six) hours as needed for cough. 120 mL 0  . sodium chloride 0.9 % infusion Inject 1,000 mLs into the vein daily. 30000 mL 0  . talazoparib tosylate (TALZENNA) 0.25 MG capsule Take 4 capsules (1 mg total) by mouth daily. To start  07/01/2020 120 capsule 6  . tamsulosin (FLOMAX) 0.4 MG CAPS capsule Take 0.4 mg by mouth 2 (two) times daily.    . vitamin B-12 (CYANOCOBALAMIN) 1000 MCG tablet Take 1,000 mcg by mouth 2 (two) times daily.      No current facility-administered medications for this visit.    OBJECTIVE:   There were no vitals filed for this visit.   There is no height or weight on file to calculate BMI.   Wt Readings from Last 3 Encounters:  08/21/20 128 lb (58.1 kg)  07/09/20 120 lb (54.4 kg)  06/17/20 123 lb 4.8 oz (55.9 kg)     ECOG FS: 3  Telemedicine visit 09/25/2020   LAB RESULTS:  CMP     Component Value Date/Time   NA 139 06/22/2020 0311   K 3.9 06/22/2020 0311   CL 107 06/22/2020 0311   CO2 22 06/22/2020 0311   GLUCOSE 92 06/22/2020 0311   BUN 14 06/22/2020 0311   CREATININE 1.72 (H) 06/22/2020 0311   CALCIUM 7.9 (L) 06/22/2020 0311   PROT 6.3 (L) 06/20/2020 0841   ALBUMIN 2.8 (L) 06/22/2020 0311   AST 14 (L) 06/20/2020 0841   ALT 7 06/20/2020 0841   ALKPHOS 57 06/20/2020 0841   BILITOT 0.4 06/20/2020 0841   GFRNONAA 32 (L) 06/22/2020 0311   GFRAA >60 10/07/2016 0834    No results found for: TOTALPROTELP, ALBUMINELP, A1GS, A2GS, BETS, BETA2SER, GAMS, MSPIKE, SPEI  Lab Results  Component Value Date   WBC 5.5 06/20/2020   NEUTROABS 10.0 (H) 06/17/2020   HGB 8.2 (L) 06/20/2020   HCT 25.3 (L) 06/20/2020   MCV 102.4 (H) 06/20/2020   PLT 315 06/20/2020    Lab Results  Component Value Date   LABCA2 33.2 08/11/2016    No components found for: EYCXKG818  No results for input(s): INR in the last 168 hours.  Lab Results  Component Value Date   LABCA2 80.2  08/11/2016    No results found for: CAN199  No results found for: JME268  Lab Results  Component Value Date   CAN153 26.4 (H) 08/11/2016    No results found for: CA2729  No components found for: HGQUANT  No results found for: CEA1 / No results found for: CEA1   No results found for: AFPTUMOR  No  results found for: CHROMOGRNA  No results found for: KPAFRELGTCHN, LAMBDASER, KAPLAMBRATIO (kappa/lambda light chains)  No results found for: HGBA, HGBA2QUANT, HGBFQUANT, HGBSQUAN (Hemoglobinopathy evaluation)   Lab Results  Component Value Date   LDH 176 04/27/2008    Lab Results  Component Value Date   IRON 79 10/07/2016   TIBC 277 10/07/2016   IRONPCTSAT 28 10/07/2016   (Iron and TIBC)  Lab Results  Component Value Date   FERRITIN 156 10/07/2016    Urinalysis    Component Value Date/Time   COLORURINE STRAW (A) 06/03/2020 [place] 06/03/2020 1613   LABSPEC 1.008 06/03/2020 1613   PHURINE 6.0 06/03/2020 1613   GLUCOSEU NEGATIVE 06/03/2020 1613   [place] 06/03/2020 1613   [place] 06/03/2020 1613   [place] 06/03/2020 [place] 06/03/2020 1613   UROBILINOGEN 0.2 08/06/2008 1500   NITRITE NEGATIVE 06/03/2020 1613   LEUKOCYTESUR NEGATIVE 06/03/2020 1613    STUDIES: No results found.   ELIGIBLE FOR AVAILABLE RESEARCH PROTOCOL: *no  ASSESSMENT: 70 y.o. [name] with stage IV BRCA2 positive breast cancer, as follows:  (1) left axillary nodal mass biopsy 04/09/2008 shows breast cancer, estrogen receptor 100% positive, progesterone receptor 5% positive, HER-2/neu amplified,             (a) staging studies showed stage IIIc disease  (2) received epirubicin/cyclophosphamide x4 dose dense followed by docetaxel x4 between 04/16/2008 and 08/13/2018             (a) trastuzumab started with docetaxel and continued for 52 weeks  (3) left mastectomy 08/30/2008 showed persistent disease in the breast and lymph nodes.  (4) adjuvant radiation between 09/17/2008 and 11/26/2008 included the left supraclavicular and axillary nodal areas  (5) on letrozole between 11/27/2008 and 04/20/2011, discontinued with progression             (a) left supraclavicular lymph node biopsy 04/07/2011 shows recurrent  breast cancer, estrogen and progesterone receptor positive but HER-2 not amplified  (7) on tamoxifen 04/21/2011 through 06/04/2014, discontinued with progression             (a) left supraclavicular mass biopsy again shows carcinoma invading muscle  (8) carboplatin and paclitaxel x6 cycles with good response (06/25/2014 through 10/09/2014)  (9) palbociclib and Faslodex 11/13/2014, discontinued 06/10/2015 with progression              (a) right cervical lymph node biopsy BRCA2 positive  (10) exemestane and everolimus 07/02/2015 through 08/01/2015             (a) possible pneumonitis from everolimus  (11) denosumab/Xgeva started December 2016  (12) cisplatin (40 mg/m every other week) 08/23/2015 through 12/23/2015 discontinued due to patient's desire for drug holiday  (13) olaparib 08/26/2016 through 09/08/2016--poorly tolerated             (a) ANAL CANCER resected 09/01/2016  (14) Megace 10/12/2016 through 05/11/2017, discontinued with progression             (a) repeat left posterior cervical chain node biopsy again estrogen receptor positive now progesterone receptor as well as HER-2 negative  (  15) eribulin 06/01/2017 through 11/23/2017  (16) toremifene 02/11/2018 through 10/05/2018, discontinued with progression  (17) Doxil 10/18/2018 through 04/03/2019  (18) pembrolizumab 08/21/2019 through 10/22/2019  (18) carboplatin and paclitaxel 10/24/2019 through 02/23/2020  (19) started talazoparib 08/19/2020 at 2 tablets (0.5 mg) daily  (a) dose increased to 4 tablets 1.0 mg) daily beginning 08/26/2020  (b) talazoparib discontinued with evidence of disease progression  (20) referral placed to hospice of [facility] 09/25/2020  PLAN: [name] unfortunately is progressing despite the talazoparib and we are discontinuing that.  She really has had pretty much everything else as her prior oncologist Dr. Abran Duke told her, so it is not a surprise to her that we are  pretty much at the end of the line.  She says that "it is time."  Currently we are placing a referral to hospice of [facility].  I think she is going to need residential hospice pretty soon because he is going to be very difficult to control her gasping for breath and sensation of drowning at home.  She may need Haldol or some other medication as well to control some of the visual experiences that she is having.  I am going to call her tomorrow afternoon at about the same time just to make sure that everything is in place.  She knows to call us for any other issues that may develop before then.   Virgie Dad. Magrinat, MD 09/25/2020 5:38 PM Medical Oncology and Hematology [facility] [place], [place] [address] Tel. [phone]    Fax. [phone]   This document serves as a record of services personally performed by Lurline Del, MD. It was created on his behalf by Wilburn Mylar, a trained medical scribe. The creation of this record is based on the scribe's personal observations and the provider's statements to them.   I, Lurline Del MD, have reviewed the above documentation for accuracy and completeness, and I agree with the above.   *Total Encounter Time as defined by the Centers for Medicare and Medicaid Services includes, in addition to the face-to-face time of a patient visit (documented in the note above) non-face-to-face time: obtaining and reviewing outside history, ordering and reviewing medications, tests or procedures, care coordination (communications with other health care professionals or caregivers) and documentation in the medical record.

## 2020-09-25 NOTE — Progress Notes (Signed)
[place]  [phone] [phone]     ID: [name] DOB: 11-29-1950  MR#: [id number]  KDX#:[id number]  Patient Care Team: Celene Squibb, MD as PCP - General (Internal Medicine) Chauncey Cruel, MD OTHER MD:  I connected with [name] on 09/26/20 at  3:30 PM EST by telephone visit and verified that I am speaking with the correct person using two identifiers.   I discussed the limitations, risks, security and privacy concerns of performing an evaluation and management service by telemedicine and the availability of in-person appointments. I also discussed with the patient that there may be a patient responsible charge related to this service. The patient expressed understanding and agreed to proceed.   Other persons participating in the visit and their role in the encounter: Patient's sister  Patient's location: Home Provider's location: [place]  Total time: 10 minutes.   CHIEF COMPLAINT: Metastatic breast cancer  CURRENT TREATMENT: discontinuing talazoparib   INTERVAL HISTORY: [name] was contacted today for evaluation of her history of metastatic breast cancer.  We talked yesterday and documented the fact that she was having clear disease progression despite thePARP inhibitor.  We discontinued the lenvatinib and placed a hospice referral.  Hospice came earlier this afternoon.  They have started her on liquid morphine and she seems to be benefiting from that.  She knows to call them and to call us as needed and we discussed that in detail.  REVIEW OF SYSTEMS: [place] continues to be significantly short of breath with minimal activity.  She has less air hunger however.  Fortunately she has no pain at present.   COVID 19 VACCINATION STATUS:    HISTORY OF CURRENT ILLNESS: From Jennifer Pane, PA-C note on 09/24/2016:  "[name] 70 y.o. female returns for followup of Stage IV breast cancer ER/PR positive,  HER-2/neu negative. BRCA2 POSITIVE. History of stage IIIc left breast cancer in 2009 treated with neoadjuvant chemotherapy consisting of epirubicin and Cytoxan followed by 4 cycles of docetaxel from 04/16/2008-08/13/2008 with the initiation of Herceptin for 52 weeks at the start of docetaxel. She then underwent a left mastectomy on 08/30/2008 showing persistent disease in the breast and lymph nodes. This was followed by radiation therapy from 09/17/2008-11/26/2008 to the left chest wall, supraclavicular, and axillary node areas. She then moved on to antiestrogen therapy with letrozole (11/27/2008-04/20/2011). She was found to have recurrent breast cancer on supraclavicular lymph node biopsy on 03/30/2011 leading to a change in antiestrogen therapy to tamoxifen (04/21/2011-06/04/2014). Pet imaging on 05/25/2014 demonstrated progression of left submandibular lymph node, mediastinal lymph node, and probable lung involvement. This was followed by a biopsy on 06/13/2014 of a left supraclavicular mass that did reveal metastatic carcinoma consistent with breast primary. She was then treated with carboplatin and Taxol for 6 cycles with an excellent response (06/25/2014-10/09/2014). She was then transition back to hormone therapy consisting of Ibrance and Faslodex (11/13/2014-06/10/2015). This was complicated by grade 3 neutropenia with cycle #2 resulting in a dose reduction of Ibrance to 100 mg for cycle 3. Unfortunately, on 06/10/2015, she was noted to have progression of disease in the right mid cervical lymph node over a 4 day period that was PET avid. Therapy was therefore changed to exemestane and everolimus (97/67/3419-37/90/2409) that was complicated by an adverse reaction consisting of diffuse pneumonitis. This regimen was therefore discontinued. She then transitioned back to systemic chemotherapy consisting of cisplatin every 14 days (08/23/2015-12/23/2015). She was put on a drug  holiday beginning on 12/23/2015 in  preparation for a trip to [place] on 01/25/2016. She was on Xeloda maintenance in a 7 day on and 7 day off fashion beginning on 04/20/2016, but progression of disease was noted in cervical lymph node.  She is NOW on [place] beginning on 08/26/2016. AND squamous cell carcinoma of anus, HPV POSITIVE, S/P XRT with Xeloda concomitantly in the neoadjuvant setting (02/07/2016- 03/10/2016).  S/P APR by Dr. Leighton Ruff on 12/01/5359, demonstrating a partial response to therapy, Stage IIA (T2N0M0).  Initial medical oncology recommendations provided by Dr. Tressie Stalker at [facility]"  The patient's subsequent history is as detailed below.   PAST MEDICAL HISTORY: Past Medical History:  Diagnosis Date  . Adenocarcinoma of breast ([address]) 01/27/2011   stage IIIc breast cancer  . Bladder infection   . Bone metastasis ([place]) 01/17/2016  . Borderline hypertension   . Breast cancer ([place])   . Closed fracture of humerus sept 2013  . Depression   . Family history of adverse reaction to anesthesia    father remembered his surgery  . Fatigue   . GERD (gastroesophageal reflux disease)   . Hypertension   . 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 Osteoporosis 01/27/2011  . Pneumonia   . Port catheter in place 04/14/2013  . Post-mastectomy lymphedema syndrome   . 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 Reflux   . Retinal detachment   . 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 Swollen lymph nodes   . URI (upper respiratory infection) 02/17/12  . Wears glasses     PAST SURGICAL HISTORY: Past Surgical History:  Procedure Laterality Date  . ABDOMINAL PERINEAL BOWEL RESECTION N/A 09/01/2016   Procedure: ABDOMINAL PERINEAL RESECTION PERMANENT COLOSTOMY;  Surgeon: Leighton Ruff, MD;  Location: [facility];  Service: General;  Laterality: N/A;  . biopsy of lymph node     super clavicle  . BTL    . CATARACT EXTRACTION    . cervical disc fusioni    .  CYSTOSCOPY W/ URETERAL STENT PLACEMENT Bilateral 06/03/2020   Procedure: CYSTOSCOPY WITH RETROGRADE PYELOGRAM/URETERAL STENT PLACEMENT bladder biopsy;  Surgeon: [name], MD;  Location: [facility];  Service: Urology;  Laterality: Bilateral;  . CYSTOSCOPY WITH STENT PLACEMENT Left 06/07/2020   Procedure: CYSTOSCOPY LEFT STENT EXCHANGE, retrograde pylogram;  Surgeon: [name], MD;  Location: [facility];  Service: Urology;  Laterality: Left;  . IR NEPHROSTOMY EXCHANGE LEFT  06/26/2020  . IR NEPHROSTOMY EXCHANGE LEFT  08/21/2020  . IR NEPHROSTOMY PLACEMENT LEFT  06/17/2020  . IR RADIOLOGIST EVAL & MGMT  07/02/2020  . IR TRANSCATH RETRIEVAL FB INCL GUIDANCE (MS)  06/26/2020  . LYMPH NODE BIOPSY Right 06/19/2015   Procedure: RIGHT NECK LYMPH NODE EXCISION;  Surgeon: Rolm Bookbinder, MD;  Location: [place];  Service: General;  Laterality: Right;  . LYMPH NODE DISSECTION Left 04/26/2017   Procedure: EXCISION LEFT NECK LYMPH NODE;  Surgeon: Rolm Bookbinder, MD;  Location: [place];  Service: General;  Laterality: Left;  Marland Kitchen MASS EXCISION Left 07/27/2016   Procedure: EXCISION OF POSTERIOR NECK MASS;  Surgeon: Rolm Bookbinder, MD;  Location: [place];  Service: General;  Laterality: Left;  Marland Kitchen MASTECTOMY MODIFIED RADICAL     left breast  . Port- a-cath insertion    . retinal detachment and repair      FAMILY HISTORY: Family History  Problem Relation  Age of Onset  . Hypertension Mother   . Heart disease Mother        atrial fib  . Heart disease Father   . 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     ADVANCED DIRECTIVES:    HEALTH MAINTENANCE: Social History   Tobacco Use  . Smoking status: Never Smoker  . Smokeless tobacco: Never Used  Vaping Use  . Vaping Use: Never used  Substance Use Topics  .  Alcohol use: Yes    Alcohol/week: 7.0 standard drinks    Types: 7 Glasses of wine per week    Comment: 1 drink daily  . Drug use: Yes    Types: Marijuana    Comment: During chemo, none over 1 Year     Colonoscopy:   PAP:   Bone density:    Allergies  Allergen Reactions  . Ciprofloxacin Other (See Comments)    tendonitis  . Prochlorperazine Anxiety    Other reaction(s): Confusion    Current Outpatient Medications  Medication Sig Dispense Refill  . albuterol (VENTOLIN HFA) 108 (90 Base) MCG/ACT inhaler Inhale 2 puffs into the lungs daily as needed for wheezing or shortness of breath.    . benzonatate (TESSALON) 100 MG capsule Take 1 capsule (100 mg total) by mouth 2 (two) times daily. (Patient not taking: No sig reported) 30 capsule 0  . buPROPion (WELLBUTRIN SR) 100 MG 12 hr tablet Take 100 mg by mouth 2 (two) times daily.    Marland Kitchen CALCIUM-VITAMIN D PO Take 600 mg by mouth 2 (two) times daily.    Marland Kitchen denosumab (XGEVA) 120 MG/1.7ML SOLN injection Inject 120 mg into the skin every 3 (three) months.    . docusate sodium (COLACE) 100 MG capsule Take 1 capsule (100 mg total) by mouth daily. 10 capsule 0  . furosemide (LASIX) 20 MG tablet Take 1 tablet (20 mg total) by mouth daily. 30 tablet 3  . gabapentin (NEURONTIN) 300 MG capsule Take 1 capsule (300 mg total) by mouth in the morning, at noon, in the evening, and at bedtime. 120 capsule 4  . HYDROcodone-acetaminophen (NORCO/VICODIN) 5-325 MG tablet Take 1-2 tablets by mouth every 4 (four) hours as needed. 360 tablet 0  . metoprolol succinate (TOPROL-XL) 25 MG 24 hr tablet Take 25 mg by mouth in the morning and at bedtime.   5  . Multiple Vitamin (MULTIVITAMIN WITH MINERALS) TABS tablet Take 1 tablet by mouth daily. 90 tablet 0  . nitrofurantoin, macrocrystal-monohydrate, (MACROBID) 100 MG capsule Take 1 capsule (100 mg total) by mouth daily. 20 capsule 1  . omeprazole (PRILOSEC) 40 MG capsule Take 1 capsule (40 mg total) by mouth 2 (two)  times daily. 180 capsule 4  . polyethylene glycol (MIRALAX / GLYCOLAX) 17 g packet Take 17 g by mouth daily. 14 each 0  . potassium chloride (KLOR-CON) 10 MEQ tablet Take 2 tablets (20 mEq total) by mouth 2 (two) times daily. 120 tablet 6  . predniSONE (DELTASONE) 20 MG tablet Take 1 tablet (20 mg total) by mouth daily with breakfast. 60 tablet 3  . prochlorperazine (COMPAZINE) 5 MG tablet Take 1 tablet (5 mg total) by mouth 4 (four) times daily -  before meals and at bedtime. (Patient not taking: No sig reported) 90 tablet 4  . promethazine-codeine (  PHENERGAN WITH CODEINE) 6.25-10 MG/5ML syrup Take 5 mLs by mouth every 6 (six) hours as needed for cough. 120 mL 0  . sodium chloride 0.9 % infusion Inject 1,000 mLs into the vein daily. 30000 mL 0  . talazoparib tosylate (TALZENNA) 0.25 MG capsule Take 4 capsules (1 mg total) by mouth daily. To start 07/01/2020 120 capsule 6  . tamsulosin (FLOMAX) 0.4 MG CAPS capsule Take 0.4 mg by mouth 2 (two) times daily.    . vitamin B-12 (CYANOCOBALAMIN) 1000 MCG tablet Take 1,000 mcg by mouth 2 (two) times daily.      No current facility-administered medications for this visit.    OBJECTIVE:   There were no vitals filed for this visit.   There is no height or weight on file to calculate BMI.   Wt Readings from Last 3 Encounters:  08/21/20 128 lb (58.1 kg)  07/09/20 120 lb (54.4 kg)  06/17/20 123 lb 4.8 oz (55.9 kg)     ECOG FS: 3  Telemedicine visit 09/26/2020   LAB RESULTS:  CMP     Component Value Date/Time   NA 139 06/22/2020 0311   K 3.9 06/22/2020 0311   CL 107 06/22/2020 0311   CO2 22 06/22/2020 0311   GLUCOSE 92 06/22/2020 0311   BUN 14 06/22/2020 0311   CREATININE 1.72 (H) 06/22/2020 0311   CALCIUM 7.9 (L) 06/22/2020 0311   PROT 6.3 (L) 06/20/2020 0841   ALBUMIN 2.8 (L) 06/22/2020 0311   AST 14 (L) 06/20/2020 0841   ALT 7 06/20/2020 0841   ALKPHOS 57 06/20/2020 0841   BILITOT 0.4 06/20/2020 0841   GFRNONAA 32 (L) 06/22/2020  0311   GFRAA >60 10/07/2016 0834    No results found for: TOTALPROTELP, ALBUMINELP, A1GS, A2GS, BETS, BETA2SER, GAMS, MSPIKE, SPEI  Lab Results  Component Value Date   WBC 5.5 06/20/2020   NEUTROABS 10.0 (H) 06/17/2020   HGB 8.2 (L) 06/20/2020   HCT 25.3 (L) 06/20/2020   MCV 102.4 (H) 06/20/2020   PLT 315 06/20/2020    Lab Results  Component Value Date   LABCA2 33.2 08/11/2016    No components found for: [id number]  No results for input(s): INR in the last 168 hours.  Lab Results  Component Value Date   LABCA2 33.2 08/11/2016    No results found for: CAN199  No results found for: WPY099  Lab Results  Component Value Date   CAN153 26.4 (H) 08/11/2016    No results found for: CA2729  No components found for: HGQUANT  No results found for: CEA1 / No results found for: CEA1   No results found for: AFPTUMOR  No results found for: CHROMOGRNA  No results found for: KPAFRELGTCHN, LAMBDASER, KAPLAMBRATIO (kappa/lambda light chains)  No results found for: HGBA, HGBA2QUANT, HGBFQUANT, HGBSQUAN (Hemoglobinopathy evaluation)   Lab Results  Component Value Date   LDH 176 04/27/2008    Lab Results  Component Value Date   IRON 79 10/07/2016   TIBC 277 10/07/2016   IRONPCTSAT 28 10/07/2016   (Iron and TIBC)  Lab Results  Component Value Date   FERRITIN 156 10/07/2016    Urinalysis    Component Value Date/Time   COLORURINE STRAW (A) 06/03/2020 [place] 06/03/2020 1613   LABSPEC 1.008 06/03/2020 1613   PHURINE 6.0 06/03/2020 [place] 06/03/2020 [place] 06/03/2020 [place] 06/03/2020 [place] 06/03/2020 [place] 06/03/2020  1613   UROBILINOGEN 0.2 08/06/2008 1500   NITRITE NEGATIVE 06/03/2020 1613   LEUKOCYTESUR NEGATIVE 06/03/2020 1613    STUDIES: No results found.   ELIGIBLE FOR AVAILABLE RESEARCH PROTOCOL: *no  ASSESSMENT: 70 y.o. [place]  woman with stage IV BRCA2 positive breast cancer, as follows:  (1) left axillary nodal mass biopsy 04/09/2008 shows breast cancer, estrogen receptor 100% positive, progesterone receptor 5% positive, HER-2/neu amplified,             (a) staging studies showed stage IIIc disease  (2) received epirubicin/cyclophosphamide x4 dose dense followed by docetaxel x4 between 04/16/2008 and 08/13/2018             (a) trastuzumab started with docetaxel and continued for 52 weeks  (3) left mastectomy 08/30/2008 showed persistent disease in the breast and lymph nodes.  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  (8) carboplatin and paclitaxel x6 cycles with good response (06/25/2014 through 10/09/2014)  (9) palbociclib and Faslodex 11/13/2014, discontinued 06/10/2015 with progression              (a) right cervical lymph node biopsy BRCA2 positive  (10) exemestane and everolimus 07/02/2015 through 08/01/2015             (a) possible pneumonitis from everolimus  (11) denosumab/Xgeva started December 2016  (12) cisplatin (40 mg/m every other week) 08/23/2015 through 12/23/2015 discontinued due to patient's desire for drug holiday  (13) olaparib 08/26/2016 through 09/08/2016--poorly tolerated             (a) ANAL CANCER resected 09/01/2016  (14) Megace 10/12/2016 through 05/11/2017, discontinued with progression             (a) repeat left posterior cervical chain node biopsy again estrogen receptor positive  now progesterone receptor as well as HER-2 negative  (15) eribulin 06/01/2017 through 11/23/2017  (16) toremifene 02/11/2018 through 10/05/2018, discontinued with progression  (17) Doxil 10/18/2018 through 04/03/2019  (18) pembrolizumab 08/21/2019 through 10/22/2019  (18) carboplatin and paclitaxel 10/24/2019 through 02/23/2020  (19) started talazoparib 08/19/2020 at 2 tablets (0.5 mg) daily  (a) dose increased to 4 tablets 1.0 mg) daily beginning 08/26/2020  (b) talazoparib discontinued with evidence of disease progression  (20) referral placed to hospice of [facility] 09/25/2020   PLAN: Lenia is now under the care of a hospice of [place].  She remains as levelheaded as ever and as practical as ever.  Sh has already established a good relationship with them and she understands that she is still under our care, although now indirectly, and that we are available for anything that we can do to help.  I am going to give her a call next week just to make sure everything continues under control.  Virgie Dad. Magrinat, MD 09/26/2020 5:12 PM Medical Oncology and Hematology [facility] [place], [place] [address] Tel. [phone]    Fax. [phone]   This document serves as a record of services personally performed by Lurline Del, MD. It was created on  his behalf by Wilburn Mylar, a trained medical scribe. The creation of this record is based on the scribe's personal observations and the provider's statements to them.   I, Lurline Del MD, have reviewed the above documentation for accuracy and completeness, and I agree with the above.   *Total Encounter Time as defined by the Centers for Medicare and Medicaid Services includes, in addition to the face-to-face time of a patient visit (documented in the note above) non-face-to-face time: obtaining and reviewing outside history, ordering and reviewing medications, tests or procedures,  care coordination (communications with other health care professionals or caregivers) and documentation in the medical record.

## 2020-09-26 ENCOUNTER — Ambulatory Visit (HOSPITAL_BASED_OUTPATIENT_CLINIC_OR_DEPARTMENT_OTHER): Payer: Medicare Other | Admitting: Oncology

## 2020-09-26 DIAGNOSIS — R52 Pain, unspecified: Secondary | ICD-10-CM | POA: Diagnosis not present

## 2020-09-26 DIAGNOSIS — C50919 Malignant neoplasm of unspecified site of unspecified female breast: Secondary | ICD-10-CM | POA: Diagnosis not present

## 2020-09-26 DIAGNOSIS — M810 Age-related osteoporosis without current pathological fracture: Secondary | ICD-10-CM | POA: Diagnosis not present

## 2020-09-26 DIAGNOSIS — Z170 Estrogen receptor positive status [ER+]: Secondary | ICD-10-CM | POA: Diagnosis not present

## 2020-09-26 DIAGNOSIS — R630 Anorexia: Secondary | ICD-10-CM | POA: Diagnosis not present

## 2020-09-26 DIAGNOSIS — C7951 Secondary malignant neoplasm of bone: Secondary | ICD-10-CM | POA: Diagnosis not present

## 2020-09-26 DIAGNOSIS — I10 Essential (primary) hypertension: Secondary | ICD-10-CM | POA: Diagnosis not present

## 2020-09-26 DIAGNOSIS — R110 Nausea: Secondary | ICD-10-CM | POA: Diagnosis not present

## 2020-09-26 DIAGNOSIS — N131 Hydronephrosis with ureteral stricture, not elsewhere classified: Secondary | ICD-10-CM | POA: Diagnosis not present

## 2020-09-26 DIAGNOSIS — E440 Moderate protein-calorie malnutrition: Secondary | ICD-10-CM | POA: Diagnosis not present

## 2020-09-26 DIAGNOSIS — R0602 Shortness of breath: Secondary | ICD-10-CM | POA: Diagnosis not present

## 2020-09-26 DIAGNOSIS — K219 Gastro-esophageal reflux disease without esophagitis: Secondary | ICD-10-CM | POA: Diagnosis not present

## 2020-09-26 DIAGNOSIS — R42 Dizziness and giddiness: Secondary | ICD-10-CM | POA: Diagnosis not present

## 2020-09-26 DIAGNOSIS — C50812 Malignant neoplasm of overlapping sites of left female breast: Secondary | ICD-10-CM

## 2020-09-26 DIAGNOSIS — Z436 Encounter for attention to other artificial openings of urinary tract: Secondary | ICD-10-CM | POA: Diagnosis not present

## 2020-09-26 DIAGNOSIS — Z933 Colostomy status: Secondary | ICD-10-CM | POA: Diagnosis not present

## 2020-09-26 DIAGNOSIS — D539 Nutritional anemia, unspecified: Secondary | ICD-10-CM | POA: Diagnosis not present

## 2020-09-26 DIAGNOSIS — R1310 Dysphagia, unspecified: Secondary | ICD-10-CM | POA: Diagnosis not present

## 2020-09-26 DIAGNOSIS — F32A Depression, unspecified: Secondary | ICD-10-CM | POA: Diagnosis not present

## 2020-09-26 NOTE — Progress Notes (Signed)
[facility] to confirm that they received the referral on this pt. Referral verified and they are seeing pt today at 12 noon.

## 2020-09-27 DIAGNOSIS — F32A Depression, unspecified: Secondary | ICD-10-CM | POA: Diagnosis not present

## 2020-09-27 DIAGNOSIS — R110 Nausea: Secondary | ICD-10-CM | POA: Diagnosis not present

## 2020-09-27 DIAGNOSIS — K219 Gastro-esophageal reflux disease without esophagitis: Secondary | ICD-10-CM | POA: Diagnosis not present

## 2020-09-27 DIAGNOSIS — M810 Age-related osteoporosis without current pathological fracture: Secondary | ICD-10-CM | POA: Diagnosis not present

## 2020-09-27 DIAGNOSIS — C50919 Malignant neoplasm of unspecified site of unspecified female breast: Secondary | ICD-10-CM | POA: Diagnosis not present

## 2020-09-27 DIAGNOSIS — I10 Essential (primary) hypertension: Secondary | ICD-10-CM | POA: Diagnosis not present

## 2020-09-30 DIAGNOSIS — F32A Depression, unspecified: Secondary | ICD-10-CM | POA: Diagnosis not present

## 2020-09-30 DIAGNOSIS — M810 Age-related osteoporosis without current pathological fracture: Secondary | ICD-10-CM | POA: Diagnosis not present

## 2020-09-30 DIAGNOSIS — K219 Gastro-esophageal reflux disease without esophagitis: Secondary | ICD-10-CM | POA: Diagnosis not present

## 2020-09-30 DIAGNOSIS — I10 Essential (primary) hypertension: Secondary | ICD-10-CM | POA: Diagnosis not present

## 2020-09-30 DIAGNOSIS — R110 Nausea: Secondary | ICD-10-CM | POA: Diagnosis not present

## 2020-09-30 DIAGNOSIS — C50919 Malignant neoplasm of unspecified site of unspecified female breast: Secondary | ICD-10-CM | POA: Diagnosis not present

## 2020-10-03 DIAGNOSIS — I10 Essential (primary) hypertension: Secondary | ICD-10-CM | POA: Diagnosis not present

## 2020-10-03 DIAGNOSIS — K219 Gastro-esophageal reflux disease without esophagitis: Secondary | ICD-10-CM | POA: Diagnosis not present

## 2020-10-03 DIAGNOSIS — C50919 Malignant neoplasm of unspecified site of unspecified female breast: Secondary | ICD-10-CM | POA: Diagnosis not present

## 2020-10-03 DIAGNOSIS — R110 Nausea: Secondary | ICD-10-CM | POA: Diagnosis not present

## 2020-10-03 DIAGNOSIS — F32A Depression, unspecified: Secondary | ICD-10-CM | POA: Diagnosis not present

## 2020-10-03 DIAGNOSIS — M810 Age-related osteoporosis without current pathological fracture: Secondary | ICD-10-CM | POA: Diagnosis not present

## 2020-10-08 DIAGNOSIS — F32A Depression, unspecified: Secondary | ICD-10-CM | POA: Diagnosis not present

## 2020-10-08 DIAGNOSIS — K219 Gastro-esophageal reflux disease without esophagitis: Secondary | ICD-10-CM | POA: Diagnosis not present

## 2020-10-08 DIAGNOSIS — R0602 Shortness of breath: Secondary | ICD-10-CM | POA: Diagnosis not present

## 2020-10-08 DIAGNOSIS — R630 Anorexia: Secondary | ICD-10-CM | POA: Diagnosis not present

## 2020-10-08 DIAGNOSIS — R52 Pain, unspecified: Secondary | ICD-10-CM | POA: Diagnosis not present

## 2020-10-08 DIAGNOSIS — C50919 Malignant neoplasm of unspecified site of unspecified female breast: Secondary | ICD-10-CM | POA: Diagnosis not present

## 2020-10-08 DIAGNOSIS — R42 Dizziness and giddiness: Secondary | ICD-10-CM | POA: Diagnosis not present

## 2020-10-08 DIAGNOSIS — M810 Age-related osteoporosis without current pathological fracture: Secondary | ICD-10-CM | POA: Diagnosis not present

## 2020-10-08 DIAGNOSIS — R110 Nausea: Secondary | ICD-10-CM | POA: Diagnosis not present

## 2020-10-08 DIAGNOSIS — I10 Essential (primary) hypertension: Secondary | ICD-10-CM | POA: Diagnosis not present

## 2020-10-08 DIAGNOSIS — R1310 Dysphagia, unspecified: Secondary | ICD-10-CM | POA: Diagnosis not present

## 2020-10-08 DIAGNOSIS — Z933 Colostomy status: Secondary | ICD-10-CM | POA: Diagnosis not present

## 2020-10-10 DIAGNOSIS — I10 Essential (primary) hypertension: Secondary | ICD-10-CM | POA: Diagnosis not present

## 2020-10-10 DIAGNOSIS — K219 Gastro-esophageal reflux disease without esophagitis: Secondary | ICD-10-CM | POA: Diagnosis not present

## 2020-10-10 DIAGNOSIS — F32A Depression, unspecified: Secondary | ICD-10-CM | POA: Diagnosis not present

## 2020-10-10 DIAGNOSIS — R110 Nausea: Secondary | ICD-10-CM | POA: Diagnosis not present

## 2020-10-10 DIAGNOSIS — C50919 Malignant neoplasm of unspecified site of unspecified female breast: Secondary | ICD-10-CM | POA: Diagnosis not present

## 2020-10-10 DIAGNOSIS — M810 Age-related osteoporosis without current pathological fracture: Secondary | ICD-10-CM | POA: Diagnosis not present

## 2020-10-13 DIAGNOSIS — K219 Gastro-esophageal reflux disease without esophagitis: Secondary | ICD-10-CM | POA: Diagnosis not present

## 2020-10-13 DIAGNOSIS — I10 Essential (primary) hypertension: Secondary | ICD-10-CM | POA: Diagnosis not present

## 2020-10-13 DIAGNOSIS — R110 Nausea: Secondary | ICD-10-CM | POA: Diagnosis not present

## 2020-10-13 DIAGNOSIS — C50919 Malignant neoplasm of unspecified site of unspecified female breast: Secondary | ICD-10-CM | POA: Diagnosis not present

## 2020-10-13 DIAGNOSIS — F32A Depression, unspecified: Secondary | ICD-10-CM | POA: Diagnosis not present

## 2020-10-13 DIAGNOSIS — M810 Age-related osteoporosis without current pathological fracture: Secondary | ICD-10-CM | POA: Diagnosis not present

## 2020-10-14 DIAGNOSIS — K219 Gastro-esophageal reflux disease without esophagitis: Secondary | ICD-10-CM | POA: Diagnosis not present

## 2020-10-14 DIAGNOSIS — C50919 Malignant neoplasm of unspecified site of unspecified female breast: Secondary | ICD-10-CM | POA: Diagnosis not present

## 2020-10-14 DIAGNOSIS — I10 Essential (primary) hypertension: Secondary | ICD-10-CM | POA: Diagnosis not present

## 2020-10-14 DIAGNOSIS — M810 Age-related osteoporosis without current pathological fracture: Secondary | ICD-10-CM | POA: Diagnosis not present

## 2020-10-14 DIAGNOSIS — R110 Nausea: Secondary | ICD-10-CM | POA: Diagnosis not present

## 2020-10-14 DIAGNOSIS — F32A Depression, unspecified: Secondary | ICD-10-CM | POA: Diagnosis not present

## 2020-10-15 DIAGNOSIS — I10 Essential (primary) hypertension: Secondary | ICD-10-CM | POA: Diagnosis not present

## 2020-10-15 DIAGNOSIS — K219 Gastro-esophageal reflux disease without esophagitis: Secondary | ICD-10-CM | POA: Diagnosis not present

## 2020-10-15 DIAGNOSIS — F32A Depression, unspecified: Secondary | ICD-10-CM | POA: Diagnosis not present

## 2020-10-15 DIAGNOSIS — M810 Age-related osteoporosis without current pathological fracture: Secondary | ICD-10-CM | POA: Diagnosis not present

## 2020-10-15 DIAGNOSIS — C50919 Malignant neoplasm of unspecified site of unspecified female breast: Secondary | ICD-10-CM | POA: Diagnosis not present

## 2020-10-15 DIAGNOSIS — R110 Nausea: Secondary | ICD-10-CM | POA: Diagnosis not present

## 2020-10-16 ENCOUNTER — Inpatient Hospital Stay (HOSPITAL_COMMUNITY): Admission: RE | Admit: 2020-10-16 | Payer: Medicare Other | Source: Ambulatory Visit

## 2020-10-16 DIAGNOSIS — C50919 Malignant neoplasm of unspecified site of unspecified female breast: Secondary | ICD-10-CM | POA: Diagnosis not present

## 2020-10-16 DIAGNOSIS — M810 Age-related osteoporosis without current pathological fracture: Secondary | ICD-10-CM | POA: Diagnosis not present

## 2020-10-16 DIAGNOSIS — K219 Gastro-esophageal reflux disease without esophagitis: Secondary | ICD-10-CM | POA: Diagnosis not present

## 2020-10-16 DIAGNOSIS — R110 Nausea: Secondary | ICD-10-CM | POA: Diagnosis not present

## 2020-10-16 DIAGNOSIS — I10 Essential (primary) hypertension: Secondary | ICD-10-CM | POA: Diagnosis not present

## 2020-10-16 DIAGNOSIS — F32A Depression, unspecified: Secondary | ICD-10-CM | POA: Diagnosis not present

## 2020-11-08 DEATH — deceased

## 2021-11-09 ENCOUNTER — Encounter (HOSPITAL_COMMUNITY): Payer: Self-pay | Admitting: Oncology

## 2021-12-13 ENCOUNTER — Other Ambulatory Visit: Payer: Self-pay | Admitting: Nurse Practitioner

## 2023-11-18 ENCOUNTER — Other Ambulatory Visit (HOSPITAL_COMMUNITY): Payer: Self-pay
